# Patient Record
Sex: Female | Born: 1945 | Race: White | Hispanic: No | Marital: Married | Smoking: Current every day smoker
Health system: Southern US, Community
[De-identification: ages and names within clinical notes are randomized; demographics above are authoritative.]

## PROBLEM LIST (undated history)

## (undated) DIAGNOSIS — N184 Chronic kidney disease, stage 4 (severe): Secondary | ICD-10-CM

## (undated) DIAGNOSIS — I639 Cerebral infarction, unspecified: Secondary | ICD-10-CM

## (undated) DIAGNOSIS — F319 Bipolar disorder, unspecified: Secondary | ICD-10-CM

## (undated) DIAGNOSIS — G629 Polyneuropathy, unspecified: Secondary | ICD-10-CM

## (undated) DIAGNOSIS — F329 Major depressive disorder, single episode, unspecified: Secondary | ICD-10-CM

## (undated) DIAGNOSIS — E785 Hyperlipidemia, unspecified: Secondary | ICD-10-CM

## (undated) DIAGNOSIS — N393 Stress incontinence (female) (male): Secondary | ICD-10-CM

## (undated) DIAGNOSIS — Z8719 Personal history of other diseases of the digestive system: Secondary | ICD-10-CM

## (undated) DIAGNOSIS — N189 Chronic kidney disease, unspecified: Secondary | ICD-10-CM

## (undated) DIAGNOSIS — M1990 Unspecified osteoarthritis, unspecified site: Secondary | ICD-10-CM

## (undated) DIAGNOSIS — R0602 Shortness of breath: Secondary | ICD-10-CM

## (undated) DIAGNOSIS — E7800 Pure hypercholesterolemia, unspecified: Secondary | ICD-10-CM

## (undated) DIAGNOSIS — N9089 Other specified noninflammatory disorders of vulva and perineum: Secondary | ICD-10-CM

## (undated) DIAGNOSIS — F429 Obsessive-compulsive disorder, unspecified: Secondary | ICD-10-CM

## (undated) DIAGNOSIS — I10 Essential (primary) hypertension: Secondary | ICD-10-CM

## (undated) DIAGNOSIS — Z8673 Personal history of transient ischemic attack (TIA), and cerebral infarction without residual deficits: Secondary | ICD-10-CM

## (undated) DIAGNOSIS — F419 Anxiety disorder, unspecified: Secondary | ICD-10-CM

## (undated) DIAGNOSIS — I82409 Acute embolism and thrombosis of unspecified deep veins of unspecified lower extremity: Secondary | ICD-10-CM

## (undated) DIAGNOSIS — R413 Other amnesia: Secondary | ICD-10-CM

## (undated) DIAGNOSIS — C519 Malignant neoplasm of vulva, unspecified: Secondary | ICD-10-CM

## (undated) DIAGNOSIS — R0600 Dyspnea, unspecified: Secondary | ICD-10-CM

## (undated) DIAGNOSIS — F32A Depression, unspecified: Secondary | ICD-10-CM

## (undated) DIAGNOSIS — G5600 Carpal tunnel syndrome, unspecified upper limb: Secondary | ICD-10-CM

## (undated) DIAGNOSIS — G5790 Unspecified mononeuropathy of unspecified lower limb: Secondary | ICD-10-CM

## (undated) DIAGNOSIS — K219 Gastro-esophageal reflux disease without esophagitis: Secondary | ICD-10-CM

## (undated) DIAGNOSIS — R269 Unspecified abnormalities of gait and mobility: Secondary | ICD-10-CM

## (undated) HISTORY — PX: CATARACT EXTRACTION: SUR2

## (undated) HISTORY — DX: Polyneuropathy, unspecified: G62.9

## (undated) HISTORY — DX: Hyperlipidemia, unspecified: E78.5

## (undated) HISTORY — DX: Other amnesia: R41.3

## (undated) HISTORY — DX: Carpal tunnel syndrome, unspecified upper limb: G56.00

## (undated) HISTORY — DX: Malignant neoplasm of vulva, unspecified: C51.9

## (undated) HISTORY — DX: Unspecified abnormalities of gait and mobility: R26.9

---

## 1983-08-19 HISTORY — PX: OTHER SURGICAL HISTORY: SHX169

## 1983-08-19 HISTORY — PX: VAGINAL HYSTERECTOMY: SUR661

## 1994-08-18 HISTORY — PX: BLADDER SUSPENSION: SHX72

## 1997-10-09 ENCOUNTER — Ambulatory Visit (HOSPITAL_COMMUNITY): Admission: RE | Admit: 1997-10-09 | Discharge: 1997-10-09 | Payer: Self-pay | Admitting: Internal Medicine

## 1998-05-15 ENCOUNTER — Other Ambulatory Visit: Admission: RE | Admit: 1998-05-15 | Discharge: 1998-05-15 | Payer: Self-pay | Admitting: *Deleted

## 1998-07-10 ENCOUNTER — Other Ambulatory Visit: Admission: RE | Admit: 1998-07-10 | Discharge: 1998-07-10 | Payer: Self-pay | Admitting: *Deleted

## 1998-07-18 HISTORY — PX: VULVECTOMY PARTIAL: SHX6187

## 1998-11-15 ENCOUNTER — Other Ambulatory Visit: Admission: RE | Admit: 1998-11-15 | Discharge: 1998-11-15 | Payer: Self-pay | Admitting: *Deleted

## 1999-04-11 ENCOUNTER — Ambulatory Visit (HOSPITAL_COMMUNITY): Admission: RE | Admit: 1999-04-11 | Discharge: 1999-04-11 | Payer: Self-pay | Admitting: *Deleted

## 1999-04-26 ENCOUNTER — Other Ambulatory Visit: Admission: RE | Admit: 1999-04-26 | Discharge: 1999-04-26 | Payer: Self-pay | Admitting: Gastroenterology

## 1999-04-26 ENCOUNTER — Encounter (INDEPENDENT_AMBULATORY_CARE_PROVIDER_SITE_OTHER): Payer: Self-pay | Admitting: Specialist

## 2000-04-07 ENCOUNTER — Other Ambulatory Visit: Admission: RE | Admit: 2000-04-07 | Discharge: 2000-04-07 | Payer: Self-pay | Admitting: *Deleted

## 2000-04-20 ENCOUNTER — Inpatient Hospital Stay (HOSPITAL_COMMUNITY): Admission: EM | Admit: 2000-04-20 | Discharge: 2000-04-22 | Payer: Self-pay | Admitting: *Deleted

## 2000-04-23 ENCOUNTER — Observation Stay (HOSPITAL_COMMUNITY): Admission: EM | Admit: 2000-04-23 | Discharge: 2000-04-24 | Payer: Self-pay | Admitting: Psychiatry

## 2000-04-24 ENCOUNTER — Encounter: Payer: Self-pay | Admitting: Internal Medicine

## 2000-04-28 ENCOUNTER — Ambulatory Visit (HOSPITAL_COMMUNITY): Admission: RE | Admit: 2000-04-28 | Discharge: 2000-04-28 | Payer: Self-pay | Admitting: *Deleted

## 2001-04-13 ENCOUNTER — Other Ambulatory Visit: Admission: RE | Admit: 2001-04-13 | Discharge: 2001-04-13 | Payer: Self-pay | Admitting: *Deleted

## 2002-10-17 ENCOUNTER — Other Ambulatory Visit: Admission: RE | Admit: 2002-10-17 | Discharge: 2002-10-17 | Payer: Self-pay | Admitting: Internal Medicine

## 2002-10-24 ENCOUNTER — Encounter: Payer: Self-pay | Admitting: Internal Medicine

## 2002-10-24 ENCOUNTER — Ambulatory Visit (HOSPITAL_COMMUNITY): Admission: RE | Admit: 2002-10-24 | Discharge: 2002-10-24 | Payer: Self-pay | Admitting: Internal Medicine

## 2003-03-23 ENCOUNTER — Ambulatory Visit (HOSPITAL_COMMUNITY): Admission: RE | Admit: 2003-03-23 | Discharge: 2003-03-23 | Payer: Self-pay | Admitting: Neurology

## 2003-03-23 ENCOUNTER — Encounter: Payer: Self-pay | Admitting: Neurology

## 2003-06-28 ENCOUNTER — Encounter: Payer: Self-pay | Admitting: Gastroenterology

## 2004-11-14 ENCOUNTER — Ambulatory Visit (HOSPITAL_COMMUNITY): Admission: RE | Admit: 2004-11-14 | Discharge: 2004-11-14 | Payer: Self-pay | Admitting: Internal Medicine

## 2005-06-09 ENCOUNTER — Ambulatory Visit (HOSPITAL_COMMUNITY): Admission: RE | Admit: 2005-06-09 | Discharge: 2005-06-09 | Payer: Self-pay | Admitting: Internal Medicine

## 2005-11-19 ENCOUNTER — Ambulatory Visit (HOSPITAL_COMMUNITY): Admission: RE | Admit: 2005-11-19 | Discharge: 2005-11-19 | Payer: Self-pay | Admitting: Internal Medicine

## 2006-03-04 ENCOUNTER — Other Ambulatory Visit: Admission: RE | Admit: 2006-03-04 | Discharge: 2006-03-04 | Payer: Self-pay | Admitting: Obstetrics & Gynecology

## 2006-12-09 ENCOUNTER — Encounter: Payer: Self-pay | Admitting: Endocrinology

## 2007-01-12 ENCOUNTER — Ambulatory Visit (HOSPITAL_COMMUNITY): Admission: RE | Admit: 2007-01-12 | Discharge: 2007-01-12 | Payer: Self-pay | Admitting: Internal Medicine

## 2007-02-18 ENCOUNTER — Encounter: Admission: RE | Admit: 2007-02-18 | Discharge: 2007-02-18 | Payer: Self-pay | Admitting: Orthopedic Surgery

## 2007-03-23 ENCOUNTER — Encounter: Admission: RE | Admit: 2007-03-23 | Discharge: 2007-05-18 | Payer: Self-pay | Admitting: Internal Medicine

## 2007-05-05 ENCOUNTER — Other Ambulatory Visit: Admission: RE | Admit: 2007-05-05 | Discharge: 2007-05-05 | Payer: Self-pay | Admitting: Obstetrics & Gynecology

## 2008-01-04 ENCOUNTER — Encounter: Admission: RE | Admit: 2008-01-04 | Discharge: 2008-01-04 | Payer: Self-pay | Admitting: Orthopedic Surgery

## 2008-01-17 LAB — HM DEXA SCAN

## 2008-01-27 ENCOUNTER — Ambulatory Visit (HOSPITAL_COMMUNITY): Admission: RE | Admit: 2008-01-27 | Discharge: 2008-01-27 | Payer: Self-pay | Admitting: Internal Medicine

## 2008-06-02 ENCOUNTER — Ambulatory Visit: Payer: Self-pay | Admitting: Gastroenterology

## 2008-06-13 ENCOUNTER — Ambulatory Visit: Payer: Self-pay | Admitting: Gastroenterology

## 2008-06-16 ENCOUNTER — Telehealth: Payer: Self-pay | Admitting: Gastroenterology

## 2008-07-07 ENCOUNTER — Telehealth: Payer: Self-pay | Admitting: Gastroenterology

## 2008-07-10 ENCOUNTER — Telehealth: Payer: Self-pay | Admitting: Gastroenterology

## 2008-07-10 ENCOUNTER — Ambulatory Visit: Payer: Self-pay | Admitting: Gastroenterology

## 2008-07-10 ENCOUNTER — Encounter: Payer: Self-pay | Admitting: Gastroenterology

## 2008-07-12 ENCOUNTER — Encounter: Payer: Self-pay | Admitting: Gastroenterology

## 2008-08-08 ENCOUNTER — Other Ambulatory Visit: Admission: RE | Admit: 2008-08-08 | Discharge: 2008-08-08 | Payer: Self-pay | Admitting: Obstetrics and Gynecology

## 2009-02-22 ENCOUNTER — Ambulatory Visit (HOSPITAL_COMMUNITY): Admission: RE | Admit: 2009-02-22 | Discharge: 2009-02-22 | Payer: Self-pay | Admitting: Internal Medicine

## 2009-04-30 ENCOUNTER — Ambulatory Visit (HOSPITAL_COMMUNITY): Admission: RE | Admit: 2009-04-30 | Discharge: 2009-04-30 | Payer: Self-pay | Admitting: Internal Medicine

## 2009-05-23 ENCOUNTER — Encounter: Payer: Self-pay | Admitting: Endocrinology

## 2009-08-15 ENCOUNTER — Encounter: Payer: Self-pay | Admitting: Endocrinology

## 2009-08-23 ENCOUNTER — Ambulatory Visit: Payer: Self-pay | Admitting: Vascular Surgery

## 2009-08-23 ENCOUNTER — Encounter: Payer: Self-pay | Admitting: Endocrinology

## 2009-10-04 LAB — CONVERTED CEMR LAB

## 2009-10-12 ENCOUNTER — Ambulatory Visit: Payer: Self-pay | Admitting: Endocrinology

## 2009-10-12 DIAGNOSIS — J438 Other emphysema: Secondary | ICD-10-CM | POA: Insufficient documentation

## 2009-10-12 DIAGNOSIS — I10 Essential (primary) hypertension: Secondary | ICD-10-CM | POA: Insufficient documentation

## 2009-10-12 DIAGNOSIS — F339 Major depressive disorder, recurrent, unspecified: Secondary | ICD-10-CM | POA: Insufficient documentation

## 2009-11-12 ENCOUNTER — Encounter: Payer: Self-pay | Admitting: Endocrinology

## 2010-06-25 ENCOUNTER — Ambulatory Visit (HOSPITAL_COMMUNITY): Admission: RE | Admit: 2010-06-25 | Discharge: 2010-06-25 | Payer: Self-pay | Admitting: Internal Medicine

## 2010-07-19 ENCOUNTER — Ambulatory Visit (HOSPITAL_COMMUNITY)
Admission: RE | Admit: 2010-07-19 | Discharge: 2010-07-19 | Payer: Self-pay | Source: Home / Self Care | Admitting: Internal Medicine

## 2010-07-19 LAB — HM MAMMOGRAPHY: HM Mammogram: NEGATIVE

## 2010-08-23 ENCOUNTER — Ambulatory Visit
Admission: RE | Admit: 2010-08-23 | Discharge: 2010-08-23 | Payer: Self-pay | Source: Home / Self Care | Attending: Gastroenterology | Admitting: Gastroenterology

## 2010-08-23 ENCOUNTER — Encounter (INDEPENDENT_AMBULATORY_CARE_PROVIDER_SITE_OTHER): Payer: Self-pay | Admitting: *Deleted

## 2010-08-23 ENCOUNTER — Encounter: Payer: Self-pay | Admitting: Gastroenterology

## 2010-09-19 ENCOUNTER — Encounter: Payer: Self-pay | Admitting: Gastroenterology

## 2010-09-19 ENCOUNTER — Other Ambulatory Visit: Payer: Self-pay | Admitting: Gastroenterology

## 2010-09-19 ENCOUNTER — Ambulatory Visit (HOSPITAL_COMMUNITY)
Admission: RE | Admit: 2010-09-19 | Discharge: 2010-09-19 | Disposition: A | Discharge status: Home / Self Care | Payer: MEDICARE | Source: Ambulatory Visit | Attending: Gastroenterology | Admitting: Gastroenterology

## 2010-09-19 DIAGNOSIS — K2990 Gastroduodenitis, unspecified, without bleeding: Secondary | ICD-10-CM

## 2010-09-19 DIAGNOSIS — K2970 Gastritis, unspecified, without bleeding: Secondary | ICD-10-CM | POA: Insufficient documentation

## 2010-09-19 DIAGNOSIS — R1310 Dysphagia, unspecified: Secondary | ICD-10-CM | POA: Insufficient documentation

## 2010-09-19 DIAGNOSIS — J45909 Unspecified asthma, uncomplicated: Secondary | ICD-10-CM | POA: Insufficient documentation

## 2010-09-19 DIAGNOSIS — Z8673 Personal history of transient ischemic attack (TIA), and cerebral infarction without residual deficits: Secondary | ICD-10-CM | POA: Insufficient documentation

## 2010-09-19 DIAGNOSIS — E119 Type 2 diabetes mellitus without complications: Secondary | ICD-10-CM | POA: Insufficient documentation

## 2010-09-19 DIAGNOSIS — I10 Essential (primary) hypertension: Secondary | ICD-10-CM | POA: Insufficient documentation

## 2010-09-19 NOTE — Procedures (Signed)
Summary: Colonoscopy   Colonoscopy  Procedure date:  07/10/2008  Findings:      Location:  [place].    Procedures Next Due Date:    Colonoscopy: 07/2013  Patient Name: Ashley [name], Ashley [name] MRN:  Procedure Procedures: Colonoscopy CPT: B7970758.    with polypectomy. CPT: X5071110.  Personnel: Endoscopist: [name], MD.  Exam Location: Exam performed in Endoscopy Suite. Outpatient  Patient Consent: Procedure, Alternatives, Risks and Benefits discussed, consent obtained, from patient. Consent was obtained by the RN.  Indications Symptoms: Constipation  Surveillance of: Adenomatous Polyp(s).  History  Current Medications: Patient is not currently taking Coumadin.  Comments: Patient history reviewed and updated, pre-procedure physical performed prior to initiation of sedation? yes Pre-Exam Physical: Performed Jul 10, 2008. Cardio-pulmonary exam, Abdominal exam, Mental status exam WNL.  Exam Exam: Extent of exam reached: Cecum, extent intended: Cecum.  The cecum was identified by appendiceal orifice and IC valve. Patient position: on left side. Time to Cecum: 00:06:29. Time for Withdrawl: 00:08:07. Colon retroflexion performed. Images taken. ASA Classification: II. Tolerance: good.  Monitoring: Pulse and BP monitoring, Oximetry used. Supplemental O2 given.  Colon Prep Prep results: good.  Sedation Meds: Patient assessed and found to be appropriate for moderate (conscious) sedation. Fentanyl 100 mcg. given IV. Versed 12 given IV.  Findings POLYP: Transverse Colon, Maximum size: 7 mm. sessile polyp. Procedure:  snare without cautery, removed, retrieved, Polyp sent to pathology. Path # 1.  - MELANOSIS: Descending Colon to Sigmoid Colon. 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  - DIVERTICULOSIS: Descending Colon to Sigmoid Colon. Comments: SEVERAL LEFT SIDED DIVERTICULUM.  - NORMAL EXAM: Cecum to Rectum. Comments: OTHERWISE  NORMAL EXAMINATION.   Assessment Abnormal examination, see findings above.  Comments: SINGLE SMALL POLYP, NO CANCERS.  +DIVERTICULOSIS AND MELANOSIS STAINING OF MUCOSA (FROM CHRONIC STIMULANT LAXATIVES.    EVEN IF THE POLYP REMOVED TODAY IS NOT PRE-CANCEROUS, SHE WILL NEED A REPEAT COLONOSCOPY IN 5 YEARS GIVEN HER HISTORY OF PRECANCEROUS POLYPS (ADENOMAS).  SHE HAS NOT YET TRIED THE CITRUCEL FIBER SUPPLEMENTS THAT I RECOMMENDED BUT WILL DO SO. Events  Unplanned Interventions: No intervention was required.  Unplanned Events: There were no complications.   cc: Kirtland Bouchard, MD   REPORT OF SURGICAL PATHOLOGY   Case #: [id number] Patient Name: Ashley [name], Ashley L. Office Chart Number:  [id number]   MRN: [id number] Pathologist: Jaquelyn Bitter A. Rodney Cruise, MD DOB/Age  [birth year] (Age: 65)    Gender: F Date Taken:  07/10/2008 Date Received: 07/10/2008   FINAL DIAGNOSIS   ***MICROSCOPIC EXAMINATION AND DIAGNOSIS***   TRANSVERSE COLON, POLYP:  TUBULAR ADENOMA.  NO HIGH GRADE DYSPLASIA OR MALIGNANCY IDENTIFIED.   kv Date Reported:  07/11/2008     Louanne Belton. Rodney Cruise, MD *** Electronically Signed Out By EAA ***   Clinical information Personal history of polyps (kv)   specimen(s) obtained Colon, polyp(s), transverse   Gross Description Received in formalin is a tan, soft tissue fragment that is submitted in toto.  Size:  0.6 cm.  (GP:gt, 07/10/08)   gdt/     Signed by [name] MD on 07/12/2008 at 7:52 AM  ________________________________________________________________________ recall colonoscopy in 5 years   Signed by [name] MD on 07/12/2008 at 7:53 AM  ________________________________________________________________________    July 12, 2008 MRN: [id number]    [facility] [address] [place], [place]  [address]    [name],   The polyp removed during your recent procedure was proven to be adenomatous.  These are pre-cancerous polyps  that may have grown into cancers if they had not been removed.  Current nationally recognized surveillance guidelines recommend that you have a repeat colonoscopy in 5 years.  We will therefore put your information in our reminder system and will contact you in 5 years to schedule a repeat procedure.  Please call if you have any questions or concerns.       Sincerely,  [name] MD  This letter has been electronically signed by your physician.   Signed by [name] MD on 07/12/2008 at 7:53 AM  ________________________________________________________________________

## 2010-09-19 NOTE — Progress Notes (Signed)
Summary: Question about her meds before procedure  Phone Note Call from Patient Call back at Home Phone [phone]   Call For: Dr Ardis Hughs Reason for Call: Talk to Nurse Summary of Call: Question about meds before her procedure Initial call taken by: Irwin Brakeman [facility],  June 16, 2008 9:25 AM  Follow-up for Phone Call        Pt wanted to be sure that Dr. Ardis Hughs knows that her meds are prescribed and that he will see the meds she is own.  She came in for [facility] and med list given then. Follow-up by: Shaune Spittle RN,  June 16, 2008 9:40 AM

## 2010-09-19 NOTE — Miscellaneous (Signed)
Summary: previsit  Clinical Lists Changes  Medications: Added new medication of MOVIPREP 100 GM  SOLR (PEG-KCL-NACL-NASULF-NA ASC-C) As per prep instructions. - Signed Rx of MOVIPREP 100 GM  SOLR (PEG-KCL-NACL-NASULF-NA ASC-C) As per prep instructions.;  #1 x 0;  Signed;  Entered by: Rush Barer RN;  Authorized by: [name] MD;  Method used: Electronically to [facility]. [phone]*, [address] [place], [place]  [address], Ph: [phone] or [phone], Fax: [phone] Allergies: Added new allergy or adverse reaction of * IVP DYE    Prescriptions: MOVIPREP 100 GM  SOLR (PEG-KCL-NACL-NASULF-NA ASC-C) As per prep instructions.  #1 x 0   Entered by:   Rush Barer RN   Authorized by:   [name] MD   Signed by:   Rush Barer RN on 06/13/2008   Method used:   Electronically to        [facility] [phone]* (retail)       [address], [place]  [address]       Ph: [phone] or [phone]       Fax: [phone]   RxID:   [id number]

## 2010-09-19 NOTE — Assessment & Plan Note (Signed)
Summary: new endo/diabetes/#/medicare/cd   Vital Signs:  Patient profile:   65 year old female Height:      64 inches (162.56 cm) Weight:      200.50 pounds (91.14 kg) BMI:     34.54 O2 Sat:      96 % on Room air Temp:     96.4 degrees F (35.78 degrees C) oral Pulse rate:   79 / minute BP sitting:   110 / 60  (left arm) Cuff size:   large  Vitals Entered By: Gardenia Phlegm RMA (October 12, 2009 1:37 PM)  O2 Flow:  Room air CC: New Endo: Diabetes/ pt states she is not taking Budeprion, Oxybutynin, Pravachol, glimepiride, or Levemir/ CF Is Patient Diabetic? Yes   Referring Provider:  Unk Pinto Primary Provider:  Unk Pinto  CC:  New Endo: Diabetes/ pt states she is not taking Budeprion, Oxybutynin, Pravachol, glimepiride, and or Levemir/ CF.  History of Present Illness: pt states 15 years h/o dm.  she denies knowing of any chronic complications.  she has been on insulin x 2 years.  she takes nph, 50 units qam, and 25 units qpm, as well as metformin and glimepiride.  she says the cost of medications is a primary consideration.  she brings a record of her cbg's which i have reviewed today. she checks in am--it varies from 74-128.   pt says her diet is not good, and her exercise is limited by medical conditions.   symptomatically, pt states 2 years of moderate bilateral leg pain and toe pain, and slight associated numbness. pt's husband gives pt insulin, and works during the day.   Current Medications (verified): 1)  Fluoxetine Hcl 40 Mg Caps (Fluoxetine Hcl) .... Take 1 Tab Daily 2)  Budeprion Sr 150 Mg Xr12h-Tab (Bupropion Hcl) .... 2 Tab Am and 1 At Noon 3)  Clonazepam Odt 1 Mg Tbdp (Clonazepam) .... Take 1 Three Times A Day 4)  Oxybutynin Chloride 5 Mg Xr24h-Tab (Oxybutynin Chloride) .... Take 1 Tab Two Times A Day 5)  Nabumetone 750 Mg Tabs (Nabumetone) .... Take 1-2 Tabs Daily 6)  Pravachol 40 Mg Tabs (Pravastatin Sodium) .Marland Kitchen.. 1 Tab At Bedtime 7)   Hydrochlorothiazide 25 Mg Tabs (Hydrochlorothiazide) .Marland Kitchen.. 1 Tab Am 8)  Enalapril Maleate 20 Mg Tabs (Enalapril Maleate) .Marland Kitchen.. 1 Tab Once Daily 9)  Simvastatin 80 Mg Tabs (Simvastatin) .Marland Kitchen.. 1 At Bedtime 10)  Ranitidine Hcl 300 Mg Tabs (Ranitidine Hcl) .Marland Kitchen.. 1 Daily 11)  Astepro .Marland Kitchen.. 1-2 Sprays At Bedtime 12)  Flovent Diskus 100 Mcg/blist Aepb (Fluticasone Propionate (Inhal)) .... Inhale Am and Pm 13)  Spiriva Handihaler 18 Mcg Caps (Tiotropium Bromide Monohydrate) .... Inhale Am and Pm 14)  Magnesium Gluconate 500 Mg Tabs (Magnesium Gluconate) .... 3 A Day With Meals 15)  Vitamin D 8000 Mg .... Pm 16)  Vitamin B-12 2000 Mg .Marland KitchenMarland Kitchen. 1 A Day 17)  Tylenol Pain Relief Pm 525mg  .... 2 At Pm 18)  Stool Softners 58.6mg  .... 2 At Pm 19)  Novolin N 100 Unit/ml Susp (Insulin Isophane Human) .... 50 Units Am and 25 Units Pm  Allergies (verified): 1)  ! Sulfacetamide (Sulfacetamide) 2)  ! * Ivp Dye  Past History:  Past Medical History: Alcoholism Anxiety Disorder GERD Hypothyroidism Obesity hypomania eating disorder fibrocystic breast disease djd HYPERCHOLESTEROLEMIA (ICD-272.0) OTHER EMPHYSEMA (ICD-492.8) HYPERTENSION (ICD-401.9) DEPRESSION (ICD-311) IDDM (ICD-250.01) FOOT PAIN (ICD-729.5) CONSTIPATION (ICD-564.00) PERSONAL HX COLONIC POLYPS (ICD-V12.72)  Family History: Reviewed history from 06/02/2008 and no changes required. no colon  cancer in family no diabetes in immediate family.  a grandparent dies at a young age from dm  Social History: Reviewed history from 06/02/2008 and no changes required. she is married, she has no children, she is on disability, she smokes 3 packs of cigarettes a day for many years, she does not drink alcohol, she does not drink caffeinated beverages.  Review of Systems       The patient complains of weight gain and depression.         denies blurry vision, chest pain, cramps, excessive diaphoresis, and menopausal sxs. she has intermittent headache and  doe.  she has 1 episode of n/v yeaterday, when she had hypoglycemia.  she reports polyuria, easy bruising, and memory loss.  Physical Exam  General:  obese.   Head:  head: no deformity eyes: no periorbital swelling, no proptosis external nose and ears are normal mouth: no lesion seen Neck:  Supple without thyroid enlargement or tenderness. No cervical lymphadenopathy, neck masses or tracheal deviation.  Lungs:  Clear to auscultation bilaterally. Normal respiratory effort.  Heart:  Regular rate and rhythm without murmurs or gallops noted. Normal S1,S2.   Msk:  muscle bulk and strength are grossly normal.  no obvious joint swelling.  gait is normal and steady  Pulses:  dorsalis pedis intact bilat.  no carotid bruit  Extremities:  no deformity.  no ulcer on the feet.  feet are of normal color and temp.  no edema there is onychomycosis at the right great toe. Neurologic:  cn 2-12 grossly intact.   readily moves all 4's.   sensation is intact to touch on the feet  Skin:  normal texture and temp.  no rash.  not diaphoretic  Cervical Nodes:  No significant adenopathy.  Psych:  depressed affect.   Additional Exam:  outside test results are reviewed:  08/15/09: a1c=9.3    Impression & Recommendations:  Problem # 1:  IDDM (ICD-250.01) therapy limited by her husband's absence during the day.  Problem # 2:  leg and foot pain/numbness ? neuropathic  Problem # 3:  DEPRESSION (ICD-311) this limits the rx of #1  Medications Added to Medication List This Visit: 1)  Fluoxetine Hcl 40 Mg Caps (fluoxetine Hcl)  .... Take 1 tab daily 2)  Novolin R 100 Unit/ml Soln (Insulin regular human) .... Inject 50 units am and 25 units pm 3)  Nabumetone 750 Mg Tabs (nabumetone)  .... Take 1-2 tabs daily 4)  Simvastatin 80 Mg Tabs (Simvastatin) .Marland Kitchen.. 1 at bedtime 5)  Ranitidine Hcl 300 Mg Tabs (Ranitidine hcl) .Marland Kitchen.. 1 daily 6)  Astepro  .Marland Kitchen.. 1-2 sprays at bedtime 7)  Flovent Diskus 100 Mcg/blist Aepb  (Fluticasone propionate (inhal)) .... Inhale am and pm 8)  Spiriva Handihaler 18 Mcg Caps (Tiotropium bromide monohydrate) .... Inhale am and pm 9)  Magnesium Gluconate 500 Mg Tabs (Magnesium gluconate) .... 3 a day with meals 10)  Vitamin D 8000 Mg  .... Pm 11)  Vitamin B-12 2000 Mg  .Marland Kitchen.. 1 a day 12)  Tylenol Pain Relief Pm 525mg   .... 2 at pm 13)  Stool Softners 58.6mg   .... 2 at pm 14)  Novolin N 100 Unit/ml Susp (Insulin isophane human) .... 50 units am and 25 units pm  Other Orders: Podiatry Referral (Podiatry) Consultation Level IV [id number])  Patient Instructions: 1)  we discussed the importance of diet and exercise therapy and the risks of diabetes.  you should see an eye doctor every year. 2)  i  told pt we will need to take this complex situation in stages 3)  it is very important to keep good control of blood pressure and cholesterol, especially in those with diabetes.  stopping smoking also reduces the damage diabetes does to your body.  please discuss these with your doctor.  you should take an aspirin every day, unless you have been advised by a doctor not to. 4)  check your blood sugar 1-2 times a day.  vary the time of day when you check, between before the 3 meals, and at bedtime.  also check if you have symptoms of your blood sugar being too high or too low.  please keep a record of the readings and bring it to your next appointment here.  please call us sooner if you are having low blood sugar episodes. 5)  for now, stop the metformin. 6)  continue the same insulin.  please verify that it has a big "N" on the label, and is cloudy. 7)  please bring all medication bottles to your next appointment. 8)  refer to podiatry.  you will be called with a day and time for an appointment.  Preventive Care Screening  Hemoccult:    Date:  10/04/2009    Results:  historical   Pap Smear:    Date:  10/04/2009    Results:  historical   Last Tetanus Booster:    Date:  08/18/2008     Results:  Historical   Mammogram:    Date:  08/18/2008    Results:  historical   Colonoscopy:    Date:  08/19/2007    Results:  historical

## 2010-09-19 NOTE — Procedures (Signed)
Summary: colon/[facility]  colon/[facility]   Imported By: Bubba Hales 05/09/2008 10:41:08  _____________________________________________________________________  External Attachment:    Type:   Image     Comment:   External Document

## 2010-09-19 NOTE — Assessment & Plan Note (Signed)
Review of gastrointestinal problems: 1. Adenomatous colon polyps. Colonoscopy 06/2008 (adenomas removed), recall colonoscopy at 5 year interval.    History of Present Illness Visit Type: Follow-up Visit Primary GI MD: Owens Loffler MD Primary Provider: Unk Pinto Requesting Provider: Unk Pinto Chief Complaint: dysphagia History of Present Illness:     pleasant 65 year old woman who says she is constipated. can go 3 weeks without a BM.  Usually will go every 2 weeks or so.   She takes two stool softners.  She has never tried fiber supplements.  Never tried miralx.   she has a poor short term memory.  She had solid food dysphagia...  This was all 3 months ago. The symptoms have all impoved. Sounds like she had an ENT visit and was told her exam was normal.  She get pyrosis intermittently.  she doesn't think she is losing weight.           Current Medications (verified): 1)  Fluoxetine Hcl 40 Mg Caps (Fluoxetine Hcl) .... Take 1 Tab Daily 2)  Clonazepam Odt 1 Mg Tbdp (Clonazepam) .... Take 1 Three Times A Day 3)  Oxybutynin Chloride 5 Mg Xr24h-Tab (Oxybutynin Chloride) .... Take 1 Tab Two Times A Day 4)  Nabumetone 750 Mg Tabs (Nabumetone) .... Take 1-2 Tabs Daily 5)  Hydrochlorothiazide 25 Mg Tabs (Hydrochlorothiazide) .Marland Kitchen.. 1 Tab Am 6)  Enalapril Maleate 20 Mg Tabs (Enalapril Maleate) .Marland Kitchen.. 1 Tab Once Daily 7)  Simvastatin 80 Mg Tabs (Simvastatin) .Marland Kitchen.. 1 At Bedtime 8)  Ranitidine Hcl 300 Mg Tabs (Ranitidine Hcl) .Marland Kitchen.. 1 Daily 9)  Novolin N 100 Unit/ml Susp (Insulin Isophane Human) .... 50 Units Am and 25 Units Pm  Allergies (verified): 1)  ! Sulfacetamide (Sulfacetamide) 2)  ! * Ivp Dye  Vital Signs:  Patient profile:   65 year old female Height:      64 inches Weight:      213.25 pounds BMI:     36.74 Pulse rate:   80 / minute Pulse rhythm:   regular BP sitting:   110 / 60  (left arm) Cuff size:   regular  Vitals Entered By: June McMurray Mahomet Deborra Medina)  (August 23, 2010 2:19 PM)  Physical Exam  Additional Exam:  Constitutional: generally well appearing Psychiatric: alert and oriented times 3 Abdomen: soft, non-tender, non-distended, normal bowel sounds    Impression & Recommendations:  Problem # 1:  intermittent dysphasia this is probably GERD related. I have asked her to stop the H2 blocker and instead she has been given samples of a proton pump inhibitor to take once daily. We will schedule her for EGD at her soonest convenience.  Problem # 2:  CONSTIPATION (ICD-564.00) she has never try fiber supplements and will do so now.  Patient Instructions: 1)  You should start 2 pills of citrucel every morning to get your bowels moving. 2)  Nexium samples given, take one every morning. 3)  Stop the ranitidine. 4)  You will be scheduled to have an upper endoscopy at [facility] with propofol. 5)  A copy of this information will be sent to Dr. Melford Aase. 6)  The medication list was reviewed and reconciled.  All changed / newly prescribed medications were explained.  A complete medication list was provided to the patient / caregiver.  Appended Document: Orders Update/EGD    Clinical Lists Changes  Problems: Added new problem of OTHER DYSPHAGIA (ICD-787.29) Medications: Added new medication of DEXILANT 60 MG CPDR (DEXLANSOPRAZOLE) 1 by mouth once daily Orders: Added  new Test order of ZEGD (ZEGD) - Signed

## 2010-09-19 NOTE — Progress Notes (Signed)
Summary: lost prep instructions  Phone Note Call from Patient Call back at Home Phone [phone]   Caller: Patient Call For: Ashley [name]  Reason for Call: Talk to Nurse Details for Reason: instruction Summary of Call: proc on Mon --has lost instructions Initial call taken by: Quenton Fetter [facility],  July 07, 2008 10:28 AM  Follow-up for Phone Call        Pt. to send mother for copy of prep instructions Follow-up by: Emerson Monte RN,  July 07, 2008 11:03 AM  Additional Follow-up for Phone Call Additional follow up Details #1::        Pt. called and said she found her instructions for her colon prep Additional Follow-up by: Emerson Monte RN,  July 07, 2008 11:26 AM

## 2010-09-19 NOTE — Letter (Signed)
Summary: [facility]  [facility]   Imported By: Phillis Knack 11/17/2009 08:58:59  _____________________________________________________________________  External Attachment:    Type:   Image     Comment:   External Document

## 2010-09-19 NOTE — Letter (Signed)
Summary: EGD Instructions  [facility]  [place], [place] [address]   Phone: [phone]  Fax: [phone]       [name]    1946/04/15    MRN: [id number]       Procedure Day /Date:09/19/10 Ashley [name]     Arrival Time: [id number] am     Procedure Time:1030 am     Location of Procedure:                     [facility] ( Outpatient Registration)    PREPARATION FOR ENDOSCOPY   On 09/19/10  THE DAY OF THE PROCEDURE:  Nothing to eat or drink after midnight  MEDICATION INSTRUCTIONS  Unless otherwise instructed, you should take regular prescription medications with a small sip of water as early as possible the morning of your procedure.  Diabetic patients - see separate instructions.                 OTHER INSTRUCTIONS  You will need a responsible adult at least 64 years of age to accompany you and drive you home.   This person must remain in the waiting room during your procedure.  Wear loose fitting clothing that is easily removed.  Leave jewelry and other valuables at home.  However, you may wish to bring a book to read or an iPod/MP3 player to listen to music as you wait for your procedure to start.  Remove all body piercing jewelry and leave at home.  Total time from sign-in until discharge is approximately 2-3 hours.  You should go home directly after your procedure and rest.  You can resume normal activities the day after your procedure.  The day of your procedure you should not:   Drive   Make legal decisions   Operate machinery   Drink alcohol   Return to work  You will receive specific instructions about eating, activities and medications before you leave.    The above instructions have been reviewed and explained to me by   _______________________    I fully understand and can verbalize these instructions _____________________________ Date _________

## 2010-09-19 NOTE — Letter (Signed)
Summary: [facility]  [facility]   Imported By: Phillis Knack 10/16/2009 10:07:50  _____________________________________________________________________  External Attachment:    Type:   Image     Comment:   External Document

## 2010-09-19 NOTE — Letter (Signed)
Summary: Diabetic Instructions  [facility]  [place], [place] [address]   Phone: [phone]  Fax: [phone]    [name] 12-26-1945 MRN: [id number]     ________________________________________________________________________  X  INSULIN (LONG ACTING) MEDICATION INSTRUCTIONS (Lantus, NPH, 70/30, Humulin, Novolin-N)   The day before your procedure:   Take  your regular evening dose    The day of your procedure:   Do not take your morning dose    X   INSULIN (SHORT ACTING) MEDICATION INSTRUCTIONS (Regular, Humulog, Novolog)   The day before your procedure:   Do not take your evening dose   The day of your procedure:   Do not take your morning dose

## 2010-09-19 NOTE — Assessment & Plan Note (Signed)
History of Present Illness Visit Type: new patient Primary GI MD: Owens Loffler MD Primary Provider: Unk Pinto Requesting Provider: Unk Pinto Chief Complaint: constipation History of Present Illness:     65 year old woman who Has had constipation for 8-9 years.  Can go up to a week without a BM.  Mother gave her laxatives, helped after 3 days.  Recently bought stool softners hasn't tried them.  Never tried fiber supplements or miralax.  Has not had overt bleeding.  she had a colonoscopy by Dr. Ulis Rias 2002. Several polyps were removed one of which was proven to be an adenomatous polyp. She a repeat colonoscopy November of 2004 and no polyps were seen at that point. She was recommended to have a repeat colonoscopy at 5 year and interval.  Lays in bed alot, watches TV doesn't get around very well since stroke 2000.  Has been gaining weight.             Updated Prior Medication List: FLUOXETINE HCL 20 MG CAPS (FLUOXETINE HCL) Take 3 tab daily BUDEPRION SR 150 MG XR12H-TAB (BUPROPION HCL) 2 tab AM and 1 at noon CLONAZEPAM ODT 1 MG TBDP (CLONAZEPAM) take 1 three times a day OXYBUTYNIN CHLORIDE 5 MG XR24H-TAB (OXYBUTYNIN CHLORIDE) Take 1 tab two times a day METFORMIN HCL 500 MG TABS (METFORMIN HCL) 4 tab in the evening NABUMETONE 500 MG TABS (NABUMETONE) Take 2 tabs daily PRAVACHOL 40 MG TABS (PRAVASTATIN SODIUM) 1 tab at bedtime HYDROCHLOROTHIAZIDE 25 MG TABS (HYDROCHLOROTHIAZIDE) 1 tab am GLIMEPIRIDE 4 MG TABS (GLIMEPIRIDE) 1 tab three times a day ENALAPRIL MALEATE 20 MG TABS (ENALAPRIL MALEATE) 1 tab once daily LEVEMIR 100 UNIT/ML SOLN (INSULIN DETEMIR) take 1 injection 60 units  Current Allergies (reviewed today): ! SULFACETAMIDE (SULFACETAMIDE)  Past Medical History:    Reviewed history from 05/05/2008 and no changes required:       Alcoholism       Anxiety Disorder       Depression       GERD       Hypertension       Hypothyroidism        Obesity       hypomania       eating disorder       fibrocystic breast disease       djd  Past Surgical History:    Reviewed history from 05/05/2008 and no changes required:       Bladder Repair       Hysterectomy   Family History:    no colon cancer in family  Social History:    she is married, she has no children, she is on disability, she smokes 3 packs of cigarettes a day for many years, she does not drink alcohol, she does not drink caffeinated beverages.    Review of Systems       Pertinent positive and negative review of systems were noted in the above HPI and GI specific review of systems.  All other review of systems was otherwise negative.    Vital Signs:  Patient Profile:   65 Years Old Female Height:     64 inches Weight:      195 pounds BMI:     33.59 Pulse rate:   68 / minute BP sitting:   108 / 78  (left arm)  Vitals Entered By: Marisue Humble CMA (June 02, 2008 10:35 AM)  Physical Exam  Constitutional: generally well appearing Psychiatric: alert and oriented times 3 Eyes: extraocular movements intact Mouth: oropharynx moist, no lesions Neck: supple, no lymphadenopathy Cardiovascular: heart regular rate and rythm Lungs: CTA bilaterally Abdomen: soft, non-tender, non-distended, no obvious ascites, no peritoneal signs, normal bowel sounds Extremities: no lower extremity edema bilaterally Skin: no lesions on visible extremities     Impression & Recommendations:  Problem # 1:  CONSTIPATION (ICD-564.00) Her constipation is probably from Limited mobility, medications can be contributing such as oxybutynin.  she is never tried fiber supplements and so I recommended she begin taking fiber supplements on a daily basis. If this is not helpful then would progress to MiraLax.  Problem # 2:  PERSONAL HX COLONIC POLYPS (ICD-V12.72) adenomatous colon polyps removed in 2000. She is due for surveillance colonoscopy and we will arrange  that to be done at her soonest convenience. I see no reason for any further blood tests or imaging studies prior to then.   Patient Instructions: 1)  You should begin taking citrucel powder fiber supplement (orange flavor).  Start with a small spoonful and increase this over 1 week to a full, heaping spoonful daily.  You may notice some bloating when you first start the fiber, but that usually resolves after a few days.  If you don't like powder supplement, take 2 metamucil or citrucel pills a day. 2)  Excercise will help your constipation. 3)  You will be scheduled to have a colonoscopy. 4)  A copy of this information will be sent to Dr. Melford Aase. 5)  Try to stop smoking.    ]

## 2010-09-19 NOTE — Progress Notes (Signed)
Summary: COL RESULTS  Phone Note Call from Patient Call back at Home Phone [phone]   Caller: Patient Call For: Kinesha Auten Reason for Call: Lab or Test Results Details for Reason: COL RESULTS Summary of Call: HAD COL THIS MORNING...? RE THE REPORT Initial call taken by: Lucien Mons,  July 10, 2008 2:34 PM  Follow-up for Phone Call        Unable to reach patient unable to lm no answering machine. Georgianne Fick, RN  July 10, 2008 3:06 PM Was able to answer questions from colonoscopy report.  Patient is now waiting on pathology report .  Had questions on why she would have to wait 5 years for another colonoscopy. Follow-up by: Georgianne Fick, RN,  July 10, 2008 3:16 PM

## 2010-09-19 NOTE — Procedures (Signed)
Summary: Instructions for procedure/[place]  Instructions for procedure/[place]   Imported By: Phillis Knack 08/29/2010 12:00:52  _____________________________________________________________________  External Attachment:    Type:   Image     Comment:   External Document

## 2010-09-25 NOTE — Procedures (Signed)
Summary: Upper Endoscopy  Patient: Ashley [name] Note: All result statuses are Final unless otherwise noted.  Tests: (1) Upper Endoscopy (EGD)   EGD Upper Endoscopy       DONE     [facility]     [place], [place]  [address]           ENDOSCOPY PROCEDURE REPORT           PATIENT:  [name], [name]  MR#:  [id number]     BIRTHDATE:  03-04-1946, 64 yrs. old  GENDER:  female     ENDOSCOPIST:  [name], MD     PROCEDURE DATE:  09/19/2010     PROCEDURE:  EGD with biopsy, [id number]     ASA CLASS:  Class II     INDICATIONS:  mild dysphagia     MEDICATIONS:   MAC sedation, administered by CRNA     TOPICAL ANESTHETIC:  none     DESCRIPTION OF PROCEDURE:   After the risks benefits and     alternatives of the procedure were thoroughly explained, informed     consent was obtained.  The  endoscope was introduced through the     mouth and advanced to the second portion of the duodenum, without     limitations.  The instrument was slowly withdrawn as the mucosa     was fully examined.     <<PROCEDUREIMAGES>>     There was mild, non-specific gastritis. This was biopsies     (pathology jar 1) (see image2).  Otherwise the examination was     normal (see image1, image3, and image4).    Retroflexed views     revealed no abnormalities.    The scope was then withdrawn from     the patient and the procedure completed.     COMPLICATIONS:  None           ENDOSCOPIC IMPRESSION:     1) Mild gastritis     2) Otherwise normal examination; no esophagitis, no esophageal     narrowings           RECOMMENDATIONS:     Should stay on PPI once daily.  Over the counter prilosec or     prevacid or generic equivalent are all good.  Best to take one     pill 20-30 min before breakfast meal.     If biopsies show H. pylori, you will be started on appropriate     antibiotics.           ______________________________     [name], MD           cc: Unk Pinto           n.     eSIGNED:   [name] at 09/19/2010 09:36 AM           Cundari, Naydean, Kinnie [id number]  Note: An exclamation mark (!) indicates a result that was not dispersed into the flowsheet. Document Creation Date: 09/19/2010 9:36 AM _______________________________________________________________________  (1) Order result status: Final Collection or observation date-time: 09/19/2010 09:31 Requested date-time:  Receipt date-time:  Reported date-time:  Referring Physician:   Ordering Physician: Owens Loffler [phone]) Specimen Source:  Source: Tawanna Cooler Order Number: [id number] Lab site:

## 2010-11-13 LAB — GLUCOSE, CAPILLARY: Glucose-Capillary: 142 mg/dL — ABNORMAL HIGH (ref 70–99)
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
## 2011-01-03 NOTE — H&P (Signed)
[place]  Patient:    Ashley [name], Ashley [name]                         MRN: [id number] Adm. Date:  [id number] Disc. Date: [id number] Attending:  Earlie Raveling Dictator:   Gala Murdoch, N.P.                         History and Physical  IDENTIFYING INFORMATION:  Mrs. Check is a 65 year old white married female admitted April 23, 2000 on a voluntary basis after overdosing on 60 1 mg Klonopin, six 20 mg Accupril and six 500 mg Glucophage tablets on April 20, 2000.  She was admitted to the intensive care unit after the overdose for treatment and this was considered a serious suicide attempt.  HISTORY OF PRESENT ILLNESS:  The patient reports that she reports on Monday about noon.  Apparently, her boyfriend broke up with with her on Monday, April 20, 2000.  She apparently went home and overdosed, wrote a note to her husband, i.e. a suicide note.  Her husband came home unexpectedly an and found her and called EMS who took her to [facility] for treatment.  Again, she was admitted to the intensive care unit until transferred to the [facility] on April 23, 2000.  At the time she states she wanted to die; she thought this would be better for everyone, including her husband and her boyfriend.  Now she wants to live.  She denies being suicidal.  She states she is a compulsive eater, a "compulsive everything". She states she just eats certain things done certain ways.  She reports that she regrets the overdose now.  Sleep prior to the overdose, she says she sleeps mainly during the day. appetite has increased, however, she says her weight is stable. She says her manic symptoms have been worse recently.  She states she has been depressed. She feels guilty about her job and the affair she was having.  She identifies stressors as being laid off work October 2000 and her severance pay ran out in July 2001.  Apparently, she hs been having an affair with a  boyfriend, who she says is "impotent."  He broke up with her on Monday.  Apparently the husband did know about the affair for about two months.  He did condone the affair since the man was impotent.  Apparently the boyfriend broke up with her on Monday and that is when she took her overdose.  She states she loves both men, but she loves her husband and plans to stay with him.  She did state, however, her husband was letting her see the boyfriend.  The boyfriend broke up the relationship.  When the patient came on the unit she was hypomanic with pressured speech, would not go to sleep and was difficult to control and she was placed on a one-to-one.  PAST PSYCHIATRIC HISTORY:  The patient states she has never had psychiatric hospitalization.  This is her first psychiatric admission.  She has been seeing Thayer Dallas, a psychologist, here in town for 15 years.  She also sees Dr. Lajuana Ripple for 5-8 years for medication management.  PAST MEDICAL HISTORY:  The patients primary care doctor currently is unknown.  Medical problems include hypertension, diabetes mellitus type 2, hypercholesterolemia, a hiatal hernia, gastroesophageal reflux disease, a history of colon polyps, fibrocystic breast disease, esophageal spasms, degenerative joint disease, lumbar sprain, hypothyroidism, history  of asthma, stress and urge incontinence, seasonal allergies and a partial vulvectomy in December 1999.  She has also had hysterectomy.  MEDICATIONS:  Accupril 20 mg q.d., Klonopin 1 mg t.i.d.  She states she mainly only takes two a day. Prozac 20 mg 2 q.d., Lopid 600 mg b.i.d., Glucophage 500 mg q.d., calcium 500 mg t.i.d., multivitamins 1 q.d., aspirin 325 mg q.d.  She is on an agent for hypercholesterolemia, however, she does not know the name of it or the dose.  Her husband will bring up her medications.  Also, she is on hormone supplement.  She does not know the name of this or the dose. Again, her husband will  bring this to [place].  ALLERGIES:  SULFA.  SOCIAL HISTORY:  She has been married three times.  Her first marriage lasted for ten months.  She said she married out of spite.  A second marriage lasted for five years since he was seeing another woman.  This is her third marriage. She has been married 15 years.  She has no children.  She completed the 12th grade.  She has one brother, she states she does not get along with him.  Her parents are living here in [place].  They are elderly but she says they are very supportive.  Her husband is retired but he also works.  She says she was working at [employer] for 1-1/2 weeks at $8.75 an hour.  She states she plans to go back to work because she needs the financial support.  FAMILY HISTORY:  None, according to patient.  ALCOHOL AND DRUG HISTORY:  She denies current alcohol abuse, denies substance abuse.  She says she smokes three packs of cigarettes a day.  Years ago she did drink and she has been sober since 28.  POSITIVE PHYSICAL FINDINGS:  Please see physical examination done at [facility] and intensive care unit from April 20 2000 to April 22 2000.  She was treated by Dr. Susa Griffins at [facility].  On September 4, her potassium was low at 3.3 and she was supplemented with potassium, glucose high at 128, total protein down at 5.7, albumin down at 3.1.  Her CBC with diff was within normal limits.  Her white blood count was normal at 7.3.  Her EKG was within normal limits.  CURRENT MENTAL STATUS EXAMINATION:  An overweight white female dressed casually and cooperative.  She has difficulty keeping her eyes open due to some drowsiness.  She was up all night.  Speech is pressured at times but it is relevant.  Mood is expansive.  Her affect right now is blunted due to her drowsiness.  She denies current suicidal ideation, denies homicidal ideation. Thought processes - some signs and symptoms of mania, no sleep last night,  was hypomanic with pressured speech.  Cognitive - slightly drowsy currently, but she is oriented.  Her judgment is poor, insight is poor and she has poor impulse control.  CURRENT DIAGNOSES:  Axis I:     1. Bipolar disorder, mixed.             2. Obsessive compulsive disorder. Axis II:    Traits of hysterical personality disorder. Axis III:   1. Hypertension.             2. Diabetes mellitus, type 2.             3. Hypercholesterolemia.             4. Gastroesophageal reflux  disease.             5. Hiatal hernia.             6. Stress and urge incontinence.             7. History of a cerebrovascular accident in 24. Axis IV:    Severe, related to problems with her husband, boyfriend, economic             problems and her mental illness and medical problems. Axis V:     Current global assessment of functioning 30, highest in past year             10.  TREATMENT PLAN AND RECOMMENDATION:  Voluntary admission to [facility].  Check every 15 minutes, maintain safety.  The patient is able to contract for safety.  We will take her off the one-to-one supervision.  Continue her Accupril 20 mg q.d., Klonopin 1 mg p.o. t.i.d., Ativan 10 mg p.o. h.s. p.r.n. for sleep.  Glucophage 500 mg p.o. q.d., calcium 500 mg t.i.d., multivitamin 1 tab q.d., aspirin 325 mg q.d.  We need to find out from her husband the name and dosages of her current cholesterol medication as well as her hormone medication.  We have ordered a serum amylase and are going to start her on Depakote ER, 500 mg in the morning and 1000 mg at hs. to stabilize her mood.  We are going to hold the Prozac for now.  TENTATIVE LENGTH OF STAY AND DISCHARGE PLAN:  Three days. DD:  04/23/00 TD:  04/23/00 Job: [id number]

## 2011-05-20 LAB — GLUCOSE, CAPILLARY
Glucose-Capillary: 104 — ABNORMAL HIGH
Glucose-Capillary: 133 — ABNORMAL HIGH

## 2011-08-16 ENCOUNTER — Emergency Department (HOSPITAL_COMMUNITY)
Admission: EM | Admit: 2011-08-16 | Discharge: 2011-08-16 | Disposition: A | Discharge status: Home / Self Care | Payer: Medicare Other | Source: Home / Self Care | Attending: Emergency Medicine | Admitting: Emergency Medicine

## 2011-08-16 ENCOUNTER — Emergency Department (INDEPENDENT_AMBULATORY_CARE_PROVIDER_SITE_OTHER): Payer: Medicare Other

## 2011-08-16 DIAGNOSIS — J40 Bronchitis, not specified as acute or chronic: Secondary | ICD-10-CM

## 2011-08-16 HISTORY — DX: Bipolar disorder, unspecified: F31.9

## 2011-08-16 HISTORY — DX: Obsessive-compulsive disorder, unspecified: F42.9

## 2011-08-16 HISTORY — DX: Essential (primary) hypertension: I10

## 2011-08-16 HISTORY — DX: Pure hypercholesterolemia, unspecified: E78.00

## 2011-08-16 HISTORY — DX: Major depressive disorder, single episode, unspecified: F32.9

## 2011-08-16 HISTORY — DX: Depression, unspecified: F32.A

## 2011-08-16 HISTORY — DX: Cerebral infarction, unspecified: I63.9

## 2011-08-16 MED ORDER — HYDROCODONE-ACETAMINOPHEN 7.5-500 MG/15ML PO SOLN: 5.0000 mL | Freq: Four times a day (QID) | ORAL | Status: AC | PRN

## 2011-08-16 MED ORDER — FLUTICASONE PROPIONATE 50 MCG/ACT NA SUSP: 2.0000 | Freq: Every day | NASAL | Status: DC

## 2011-08-16 MED ORDER — DEXAMETHASONE 4 MG PO TABS: ORAL_TABLET | ORAL | Status: AC

## 2011-08-16 MED ORDER — ALBUTEROL SULFATE HFA 108 (90 BASE) MCG/ACT IN AERS: 1.0000 | INHALATION_SPRAY | Freq: Four times a day (QID) | RESPIRATORY_TRACT | Status: DC | PRN

## 2011-08-16 NOTE — ED Notes (Signed)
Pt has cough, sorethroat, bodyaches, and RUQ pain from coughing since 08-11-11.

## 2011-08-16 NOTE — ED Provider Notes (Signed)
History     CSN: [id number]  Arrival date & time 08/16/11  1056   First MD Initiated Contact with Patient 08/16/11 1245      Chief Complaint  Patient presents with  . Cough    HPI Comments: Pt with nonproductive cough, wheeze, SOB, achy R lower Cp after coughing x 5 days. C/o irritated throat. States is "sore" and fatigued due to all of the coughing. C/o clear rhinorrhea. No fevers, N/V, ear pain, ear fullness, sinus pain/pressure, hemoptysis. Unable to sleep at night secondary to coughing. H/o asthma and has "2 medicines" at home for this but does not know what they are for or how to use them. Is  longterm heavy smoker- smoking 3 ppd since age 39.   Patient is a 65 y.o. female presenting with cough. The history is provided by the patient and a parent. No language interpreter was used.  Cough This is a new problem. The current episode started more than 2 days ago. The problem occurs constantly. The problem has been gradually worsening. The cough is non-productive. There has been no fever. Associated symptoms include rhinorrhea, sore throat, myalgias, shortness of breath and wheezing. Pertinent negatives include no chest pain, no chills, no sweats, no ear congestion, no ear pain, no headaches and no eye redness. She has tried nothing for the symptoms. She is a smoker. Her past medical history is significant for asthma.    Past Medical History  Diagnosis Date  . Diabetes mellitus   . Asthma   . Hypertension   . Bipolar 1 disorder   . Hypercholesteremia   . Stroke   . Gout   . Obesity   . ADD (attention deficit disorder)   . OCD (obsessive compulsive disorder)   . Depression     Past Surgical History  Procedure Date  . Abdominal hysterectomy   . Bladder tack     History reviewed. No pertinent family history.  History  Substance Use Topics  . Smoking status: Current Everyday Smoker -- 3.0 packs/day    Types: Cigarettes  . Smokeless tobacco: Not on file  . Alcohol Use: No      OB History    Grav Para Term Preterm Abortions TAB SAB Ect Mult Living                  Review of Systems  Constitutional: Positive for fatigue. Negative for fever and chills.  HENT: Positive for congestion, sore throat, rhinorrhea and postnasal drip. Negative for ear pain, trouble swallowing and voice change.   Eyes: Negative for redness.  Respiratory: Positive for cough, chest tightness, shortness of breath and wheezing. Negative for stridor.   Cardiovascular: Negative for chest pain.  Gastrointestinal: Negative for nausea and vomiting.  Musculoskeletal: Positive for myalgias.  Skin: Negative for rash.  Neurological: Negative for headaches.    Allergies  Sulfacetamide sodium  Home Medications   Current Outpatient Rx  Name Route Sig Dispense Refill  . CLONAZEPAM 2 MG PO TABS Oral Take 2 mg by mouth 2 (two) times daily as needed.      . COLCHICINE 0.6 MG PO TABS Oral Take 0.6 mg by mouth daily.      . ENALAPRIL MALEATE 20 MG PO TABS Oral Take 20 mg by mouth daily.      Marland Kitchen FLUOXETINE HCL 40 MG PO CAPS Oral Take 40 mg by mouth daily.      Marland Kitchen HYDROCHLOROTHIAZIDE 25 MG PO TABS Oral Take 25 mg by mouth daily.      Marland Kitchen  INSULIN ISOPHANE & REGULAR (50-50) 100 UNIT/ML [place] SUSP Subcutaneous Inject into the skin 2 (two) times daily before a meal.      . MAGNESIUM GLUCONATE 500 MG PO TABS Oral Take 500 mg by mouth 2 (two) times daily.      Marland Kitchen METFORMIN HCL 1000 MG PO TABS Oral Take 1,000 mg by mouth 2 (two) times daily with a meal.      . NABUMETONE 750 MG PO TABS Oral Take 750 mg by mouth 2 (two) times daily.      Marland Kitchen RANITIDINE HCL 300 MG PO TABS Oral Take 300 mg by mouth at bedtime.      Marland Kitchen SIMVASTATIN 80 MG PO TABS Oral Take 80 mg by mouth at bedtime.      Marland Kitchen VITAMIN D (ERGOCALCIFEROL) 50000 UNITS PO CAPS Oral Take 50,000 Units by mouth.      . ALBUTEROL SULFATE HFA 108 (90 BASE) MCG/ACT IN AERS Inhalation Inhale 1-2 puffs into the lungs every 6 (six) hours as needed for wheezing. Dispense  with aerochamber 1 Inhaler 0  . DEXAMETHASONE 4 MG PO TABS  4 tabs po at once on day one, 4 tabs po at once on day 2 8 tablet 0  . FLUTICASONE PROPIONATE 50 MCG/ACT NA SUSP Nasal Place 2 sprays into the nose daily. 16 g 2  . HYDROCODONE-ACETAMINOPHEN 7.5-500 MG/15ML PO SOLN Oral Take 5 mLs by mouth every 6 (six) hours as needed for pain. 120 mL 0    BP 128/95  Pulse 97  Temp(Src) 99.6 F (37.6 C) (Oral)  Resp 18  SpO2 95%  Physical Exam  Nursing note and vitals reviewed. Constitutional: She is oriented to person, place, and time. She appears well-developed and well-nourished. No distress.       Speaking in full sentences  HENT:  Head: Normocephalic and atraumatic.  Nose: Mucosal edema and rhinorrhea present. Right sinus exhibits no maxillary sinus tenderness and no frontal sinus tenderness. Left sinus exhibits no maxillary sinus tenderness and no frontal sinus tenderness.  Mouth/Throat: Uvula is midline, oropharynx is clear and moist and mucous membranes are normal.  Eyes: EOM are normal. Pupils are equal, round, and reactive to light.  Neck: Normal range of motion. Neck supple.  Cardiovascular: Normal rate, regular rhythm and normal heart sounds.   Pulmonary/Chest: Effort normal and breath sounds normal. No respiratory distress. She has no wheezes. She has no rales. She exhibits tenderness.       Poor air movement. Mild right lower rib tenderness at insertion of abd muscles. No crepitus.   Abdominal: Soft. Bowel sounds are normal. She exhibits no distension.       Mild upper abd tenderness  Musculoskeletal: Normal range of motion.  Neurological: She is alert and oriented to person, place, and time.  Skin: Skin is warm and dry.  Psychiatric: She has a normal mood and affect. Her behavior is normal. Judgment and thought content normal.    ED Course  Procedures (including critical care time)   Imaging reviewed by myself. Report per radiologist.  Labs Reviewed - No data to  display Dg Chest 2 View  08/16/2011  *RADIOLOGY REPORT*  Clinical Data: Shortness of breath and cough.  Right-sided chest pain from coughing.  CHEST - 2 VIEW  Comparison: 06/25/2010  Findings: There is peribronchial thickening consistent with bronchitis.  No infiltrates or effusions.  Heart size and vascularity are normal.  No osseous abnormality.  IMPRESSION: Bronchitic changes.  Original Report Authenticated By: Larey Seat,  M.D.     1. Bronchitis       MDM  Suspect emphysema/copd with 40+ year h/o smoking 3 packs a day. no radiographic or objective findings c/w PNA. Acceptable O2 sats, afebrile here. Will do bronchodilators, short course steriods, discussed need for f/u and possible eval by a pulmonologist for PFT's. Advised that steriods will elevate her glucose.    Cherly Beach, MD 08/16/11 1539

## 2012-03-06 ENCOUNTER — Emergency Department (HOSPITAL_COMMUNITY)
Admission: EM | Admit: 2012-03-06 | Discharge: 2012-03-06 | Disposition: A | Discharge status: Home / Self Care | Payer: Medicare Other | Source: Home / Self Care | Attending: Emergency Medicine | Admitting: Emergency Medicine

## 2012-03-06 ENCOUNTER — Encounter (HOSPITAL_COMMUNITY): Payer: Self-pay | Admitting: Nurse Practitioner

## 2012-03-06 DIAGNOSIS — K5900 Constipation, unspecified: Secondary | ICD-10-CM

## 2012-03-06 LAB — POCT URINALYSIS DIP (DEVICE)
Glucose, UA: 100 mg/dL — AB
Hgb urine dipstick: NEGATIVE
Ketones, ur: NEGATIVE mg/dL
Leukocytes, UA: NEGATIVE
Nitrite: NEGATIVE
Protein, ur: 30 mg/dL — AB
Specific Gravity, Urine: 1.02 (ref 1.005–1.030)
Urobilinogen, UA: 0.2 mg/dL (ref 0.0–1.0)
pH: 5 (ref 5.0–8.0)

## 2012-03-06 MED ORDER — LACTULOSE 10 GM/15ML PO SOLN: 20.0000 g | Freq: Three times a day (TID) | ORAL | Status: AC

## 2012-03-06 NOTE — ED Provider Notes (Addendum)
History     CSN: [id number]  Arrival date & time 03/06/12  1815   First MD Initiated Contact with Patient 03/06/12 1855      Chief Complaint  Patient presents with  . Urinary Retention    (Consider location/radiation/quality/duration/timing/severity/associated sxs/prior treatment) HPI Comments: Patient presents to [facility] describing that she has been unable to urinate since last night and have not had a bowel movement for several days" "about 3 " That is not unusual". No nausea, no vomiting No Fevers. I have not taken any medicines or anything new" " I have been taking a lot of fluids". "I just feel like he wanted clothing urinate and I just can't"  The history is provided by the patient.    Past Medical History  Diagnosis Date  . Diabetes mellitus   . Asthma   . Hypertension   . Bipolar 1 disorder   . Hypercholesteremia   . Stroke   . Gout   . Obesity   . ADD (attention deficit disorder)   . OCD (obsessive compulsive disorder)   . Depression   . Neuropathy     Past Surgical History  Procedure Date  . Abdominal hysterectomy   . Bladder tack     Family History  Problem Relation Age of Onset  . Stroke Father   . Heart failure Father     History  Substance Use Topics  . Smoking status: Current Everyday Smoker -- 3.0 packs/day    Types: Cigarettes  . Smokeless tobacco: Not on file  . Alcohol Use: No    OB History    Grav Para Term Preterm Abortions TAB SAB Ect Mult Living                  Review of Systems  Constitutional: Negative for fever and activity change.  Gastrointestinal: Positive for abdominal pain and constipation. Negative for nausea, diarrhea and anal bleeding.  Genitourinary: Negative for dysuria.       Urinary retention    Allergies  Sulfa antibiotics and Sulfacetamide sodium  Home Medications   Current Outpatient Rx  Name Route Sig Dispense Refill  . CLONAZEPAM 2 MG PO TABS Oral Take 2 mg by mouth 2 (two) times daily as needed.        . COLCHICINE 0.6 MG PO TABS Oral Take 0.6 mg by mouth daily.      . ENALAPRIL MALEATE 20 MG PO TABS Oral Take 20 mg by mouth daily.      Marland Kitchen FLUOXETINE HCL 40 MG PO CAPS Oral Take 40 mg by mouth daily.      Marland Kitchen HYDROCHLOROTHIAZIDE 25 MG PO TABS Oral Take 25 mg by mouth daily.      . INSULIN ISOPHANE & REGULAR (50-50) 100 UNIT/ML [place] SUSP Subcutaneous Inject into the skin 2 (two) times daily before a meal.      . METFORMIN HCL 1000 MG PO TABS Oral Take 1,000 mg by mouth 2 (two) times daily with a meal.      . SIMVASTATIN 80 MG PO TABS Oral Take 80 mg by mouth at bedtime.      Marland Kitchen VITAMIN D (ERGOCALCIFEROL) 50000 UNITS PO CAPS Oral Take 50,000 Units by mouth.      . ALBUTEROL SULFATE HFA 108 (90 BASE) MCG/ACT IN AERS Inhalation Inhale 1-2 puffs into the lungs every 6 (six) hours as needed for wheezing. Dispense with aerochamber 1 Inhaler 0  . FLUTICASONE PROPIONATE 50 MCG/ACT NA SUSP Nasal Place 2 sprays into the  nose daily. 16 g 2  . LACTULOSE 10 GM/15ML PO SOLN Oral Take 30 mLs (20 g total) by mouth 3 (three) times daily. 120 mL 0  . MAGNESIUM GLUCONATE 500 MG PO TABS Oral Take 500 mg by mouth 2 (two) times daily.      Marland Kitchen NABUMETONE 750 MG PO TABS Oral Take 750 mg by mouth 2 (two) times daily.      Marland Kitchen RANITIDINE HCL 300 MG PO TABS Oral Take 300 mg by mouth at bedtime.        BP 149/74  Pulse 89  Temp 97.5 F (36.4 C) (Oral)  Resp 20  SpO2 97%  Physical Exam  Nursing note and vitals reviewed. Constitutional: Vital signs are normal. She appears well-developed and well-nourished.  Non-toxic appearance. She does not have a sickly appearance. She does not appear ill. No distress.  HENT:  Head: Normocephalic.  Mouth/Throat: Uvula is midline, oropharynx is clear and moist and mucous membranes are normal.  Neck: Neck supple.  Abdominal: Soft. She exhibits no distension and no mass. There is no hepatosplenomegaly, splenomegaly or hepatomegaly. There is tenderness in the suprapubic area. There is no  rigidity, no rebound, no guarding and no CVA tenderness.  Musculoskeletal: She exhibits tenderness.  Neurological: She is alert.  Skin: Skin is warm. No rash noted. There is erythema. No pallor.       ED Course  Procedures (including critical care time)  Labs Reviewed  POCT URINALYSIS DIP (DEVICE) - Abnormal; Notable for the following:    Glucose, UA 100 (*)     Bilirubin Urine SMALL (*)     Protein, ur 30 (*)     All other components within normal limits   No results found.   1. Constipation       MDM  Two concerns:  1- Constipation (lactulose Rx to use for 48 hours ONLY) 2- Urinary retention ( Straight cath 100 cc) 3- Fall during [facility] visit. Mild superficial erythema- See Incident report generated by nurse.  Patient was otherwise about symptoms warrant further evaluation in the emergency department such as increased abdominal pain, fevers or vomiting. Post- fall exam was unremarkable.   I was approached by nursing staff reporting that patient fell as she slipped and sustained a minor injury to her right forearm and the posterior aspect of the right upper shoulder and left costal region. No hematoma, no ecchymosis, no deformities and no bone tenderness in any of the affected areas.          Rosana Hoes, MD 03/06/12 2040  Rosana Hoes, MD 03/06/12 2049

## 2012-03-06 NOTE — ED Notes (Addendum)
Pt slipped on floor and fell onto buttocks while attempting to replace clothing.  Repeatedly stating, "I'm fine.  It was my fault.  I fall all the time."  When asked if she is hurting anywhere, states, "I'm fine," then reports minimal discomfort to left mid-back - small area of redness noted.  Assisted into chair x 4 RNs.  After settling into chair and asked again if anything hurts, again stated she is fine, but has just minimal soreness to spot on right elbow (small, red line noted which dissipated after few moments), slight left shoulder soreness, and tiny red spot to left mid-back, which also dissipated after several moments.  No change in ROM, ambulated to restroom without difficulty with stand-by assistance of spouse per patient request.

## 2012-05-18 ENCOUNTER — Other Ambulatory Visit: Payer: Self-pay | Admitting: Gynecology

## 2012-06-10 ENCOUNTER — Other Ambulatory Visit: Payer: Self-pay | Admitting: Gynecology

## 2012-06-18 ENCOUNTER — Encounter (HOSPITAL_BASED_OUTPATIENT_CLINIC_OR_DEPARTMENT_OTHER): Payer: Self-pay | Admitting: *Deleted

## 2012-06-18 NOTE — Progress Notes (Signed)
NPO AFTER MN. ARRIVES AT 0615. NEEDS ISTAT AND EKG. WILL TAKE SYNTHROID, PRILOSEC, KLONIPIN AM OF SURG W/ SIP  OF WATER.

## 2012-06-21 ENCOUNTER — Encounter (HOSPITAL_BASED_OUTPATIENT_CLINIC_OR_DEPARTMENT_OTHER): Payer: Self-pay | Admitting: *Deleted

## 2012-06-22 ENCOUNTER — Ambulatory Visit (HOSPITAL_BASED_OUTPATIENT_CLINIC_OR_DEPARTMENT_OTHER)
Admission: RE | Admit: 2012-06-22 | Discharge: 2012-06-22 | Disposition: A | Discharge status: Home / Self Care | Payer: Medicare Other | Source: Ambulatory Visit | Attending: Gynecology | Admitting: Gynecology

## 2012-06-22 ENCOUNTER — Encounter (HOSPITAL_BASED_OUTPATIENT_CLINIC_OR_DEPARTMENT_OTHER): Payer: Self-pay | Admitting: Anesthesiology

## 2012-06-22 ENCOUNTER — Encounter (HOSPITAL_BASED_OUTPATIENT_CLINIC_OR_DEPARTMENT_OTHER): Payer: Self-pay

## 2012-06-22 ENCOUNTER — Ambulatory Visit (HOSPITAL_BASED_OUTPATIENT_CLINIC_OR_DEPARTMENT_OTHER): Payer: Medicare Other | Admitting: Anesthesiology

## 2012-06-22 ENCOUNTER — Encounter (HOSPITAL_BASED_OUTPATIENT_CLINIC_OR_DEPARTMENT_OTHER)
Admission: RE | Disposition: A | Discharge status: Home / Self Care | Payer: Self-pay | Source: Ambulatory Visit | Attending: Gynecology

## 2012-06-22 DIAGNOSIS — D071 Carcinoma in situ of vulva: Secondary | ICD-10-CM | POA: Insufficient documentation

## 2012-06-22 HISTORY — DX: Personal history of other diseases of the digestive system: Z87.19

## 2012-06-22 HISTORY — DX: Personal history of transient ischemic attack (TIA), and cerebral infarction without residual deficits: Z86.73

## 2012-06-22 HISTORY — DX: Anxiety disorder, unspecified: F41.9

## 2012-06-22 HISTORY — PX: VULVAR LESION REMOVAL: SHX5391

## 2012-06-22 HISTORY — DX: Gastro-esophageal reflux disease without esophagitis: K21.9

## 2012-06-22 HISTORY — DX: Shortness of breath: R06.02

## 2012-06-22 HISTORY — DX: Other specified noninflammatory disorders of vulva and perineum: N90.89

## 2012-06-22 HISTORY — DX: Unspecified mononeuropathy of unspecified lower limb: G57.90

## 2012-06-22 HISTORY — DX: Unspecified osteoarthritis, unspecified site: M19.90

## 2012-06-22 HISTORY — DX: Stress incontinence (female) (male): N39.3

## 2012-06-22 LAB — POCT I-STAT 4, (NA,K, GLUC, HGB,HCT)
Glucose, Bld: 202 mg/dL — ABNORMAL HIGH (ref 70–99)
HCT: 41 % (ref 36.0–46.0)
Hemoglobin: 13.9 g/dL (ref 12.0–15.0)
Potassium: 4.6 mEq/L (ref 3.5–5.1)
Sodium: 134 mEq/L — ABNORMAL LOW (ref 135–145)

## 2012-06-22 LAB — GLUCOSE, CAPILLARY: Glucose-Capillary: 152 mg/dL — ABNORMAL HIGH (ref 70–99)

## 2012-06-22 SURGERY — VULVAR LESION
Anesthesia: General | Site: Vagina | Wound class: Clean

## 2012-06-22 MED ORDER — LACTATED RINGERS IV SOLN
INTRAVENOUS | Status: DC
  Administered 2012-06-22 (×2): via INTRAVENOUS
  Filled 2012-06-22: qty 1000

## 2012-06-22 MED ORDER — BACITRACIN-NEOMYCIN-POLYMYXIN OINTMENT TUBE
TOPICAL_OINTMENT | CUTANEOUS | Status: DC | PRN
  Administered 2012-06-22: 1 via TOPICAL

## 2012-06-22 MED ORDER — BUPIVACAINE HCL 0.5 % IJ SOLN
INTRAMUSCULAR | Status: DC | PRN
  Administered 2012-06-22: 6 mL

## 2012-06-22 MED ORDER — KETOROLAC TROMETHAMINE 30 MG/ML IJ SOLN
INTRAMUSCULAR | Status: DC | PRN
  Administered 2012-06-22: 15 mg via INTRAVENOUS

## 2012-06-22 MED ORDER — LIDOCAINE HCL (CARDIAC) 20 MG/ML IV SOLN
INTRAVENOUS | Status: DC | PRN
  Administered 2012-06-22: 75 mg via INTRAVENOUS

## 2012-06-22 MED ORDER — PROPOFOL 10 MG/ML IV BOLUS
INTRAVENOUS | Status: DC | PRN
  Administered 2012-06-22: 250 mg via INTRAVENOUS

## 2012-06-22 MED ORDER — ONDANSETRON HCL 4 MG/2ML IJ SOLN
INTRAMUSCULAR | Status: DC | PRN
  Administered 2012-06-22: 4 mg via INTRAVENOUS

## 2012-06-22 MED ORDER — FENTANYL CITRATE 0.05 MG/ML IJ SOLN
INTRAMUSCULAR | Status: DC | PRN
  Administered 2012-06-22: 25 ug via INTRAVENOUS
  Administered 2012-06-22: 50 ug via INTRAVENOUS
  Administered 2012-06-22: 25 ug via INTRAVENOUS

## 2012-06-22 SURGICAL SUPPLY — 45 items
BLADE SURG 15 STRL LF DISP TIS (BLADE) ×1 IMPLANT
BLADE SURG 15 STRL SS (BLADE) ×1
CANISTER SUCTION 1200CC (MISCELLANEOUS) IMPLANT
CANISTER SUCTION 2500CC (MISCELLANEOUS) IMPLANT
CLOTH BEACON ORANGE TIMEOUT ST (SAFETY) ×2 IMPLANT
COVER TABLE BACK 60X90 (DRAPES) ×2 IMPLANT
DRAPE LG THREE QUARTER DISP (DRAPES) ×2 IMPLANT
DRAPE UNDERBUTTOCKS STRL (DRAPE) ×2 IMPLANT
ELECT NEEDLE TIP 2.8 STRL (NEEDLE) IMPLANT
ELECT REM PT RETURN 9FT ADLT (ELECTROSURGICAL) ×2
ELECTRODE REM PT RTRN 9FT ADLT (ELECTROSURGICAL) ×1 IMPLANT
GLOVE BIO SURGEON STRL SZ 6.5 (GLOVE) ×4 IMPLANT
GLOVE BIO SURGEON STRL SZ8 (GLOVE) ×4 IMPLANT
GOWN PREVENTION PLUS LG XLONG (DISPOSABLE) ×2 IMPLANT
GOWN STRL REIN XL XLG (GOWN DISPOSABLE) ×2 IMPLANT
LEGGING LITHOTOMY PAIR STRL (DRAPES) ×2 IMPLANT
NEEDLE HYPO 25X1 1.5 SAFETY (NEEDLE) ×2 IMPLANT
NEEDLE HYPO 25X5/8 SAFETYGLIDE (NEEDLE) IMPLANT
NS IRRIG 500ML POUR BTL (IV SOLUTION) ×2 IMPLANT
PACK BASIN DAY SURGERY FS (CUSTOM PROCEDURE TRAY) ×2 IMPLANT
PAD OB MATERNITY 4.3X12.25 (PERSONAL CARE ITEMS) ×2 IMPLANT
PAD PREP 24X48 CUFFED NSTRL (MISCELLANEOUS) ×2 IMPLANT
PENCIL BUTTON HOLSTER BLD 10FT (ELECTRODE) ×2 IMPLANT
SPONGE GAUZE 4X4 12PLY (GAUZE/BANDAGES/DRESSINGS) ×2 IMPLANT
STRIP CLOSURE SKIN 1/4X4 (GAUZE/BANDAGES/DRESSINGS) IMPLANT
SUT MON AB 5-0 P3 18 (SUTURE) IMPLANT
SUT VIC AB 3-0 PS2 18 (SUTURE)
SUT VIC AB 3-0 PS2 18XBRD (SUTURE) IMPLANT
SUT VIC AB 3-0 SH 27 (SUTURE)
SUT VIC AB 3-0 SH 27X BRD (SUTURE) IMPLANT
SUT VIC AB 4-0 SH 27 (SUTURE) ×1
SUT VIC AB 4-0 SH 27XANBCTRL (SUTURE) ×1 IMPLANT
SUT VIC AB 5-0 PS2 18 (SUTURE) IMPLANT
SUT VICRYL 4-0 PS2 18IN ABS (SUTURE) ×2 IMPLANT
SUT VICRYL 6 0 UNDY PS 6 (SUTURE) IMPLANT
SWAB CULTURE LIQ STUART DBL (MISCELLANEOUS) IMPLANT
SYR BULB IRRIGATION 50ML (SYRINGE) ×2 IMPLANT
SYR CONTROL 10ML LL (SYRINGE) ×2 IMPLANT
TAPE CLOTH SURG 4X10 WHT LF (GAUZE/BANDAGES/DRESSINGS) ×2 IMPLANT
TOWEL OR 17X24 6PK STRL BLUE (TOWEL DISPOSABLE) ×4 IMPLANT
TRAY DSU PREP LF (CUSTOM PROCEDURE TRAY) ×2 IMPLANT
TUBE ANAEROBIC SPECIMEN COL (MISCELLANEOUS) IMPLANT
TUBE CONNECTING 12X1/4 (SUCTIONS) IMPLANT
WATER STERILE IRR 500ML POUR (IV SOLUTION) IMPLANT
YANKAUER SUCT BULB TIP NO VENT (SUCTIONS) IMPLANT

## 2012-06-22 NOTE — Transfer of Care (Addendum)
Immediate Anesthesia Transfer of Care Note  Patient: Ashley [name]  Procedure(s) Performed: Procedure(s) (LRB): VULVAR LESION (N/A)  Patient Location: Patient transported to PACU with oxygen via face mask at 4 Liters / Min  Anesthesia Type: General  Level of Consciousness: awake and alert   Airway & Oxygen Therapy: Patient Spontanous Breathing and Patient connected to face mask oxygen  Post-op Assessment: Report given to PACU RN and Post -op Vital signs reviewed and stable  Post vital signs: Reviewed and stable  Dentition: Teeth and oropharynx remain in pre-op condition  Complications: No apparent anesthesia complications   Patient stating that she felt no pain and remembers what Dr. Ubaldo Glassing said under anesthesia. See pre-op evaluation notes.  This is what she said she would do to play a joke on Dr. Ubaldo Glassing.

## 2012-06-22 NOTE — Anesthesia Preprocedure Evaluation (Addendum)
Anesthesia Evaluation  Patient identified by MRN, date of birth, ID band Patient awake    Reviewed: Allergy & Precautions, H&P , NPO status , Patient's Chart, lab work & pertinent test results  Airway Mallampati: II TM Distance: <3 FB Neck ROM: Full    Dental No notable dental hx.    Pulmonary asthma , COPD COPD inhaler,  breath sounds clear to auscultation  + decreased breath sounds      Cardiovascular hypertension, Pt. on medications Rhythm:Regular Rate:Normal     Neuro/Psych Depression CVA    GI/Hepatic Neg liver ROS, GERD-  Medicated,  Endo/Other  diabetes, Insulin DependentMorbid obesity  Renal/GU negative Renal ROS  negative genitourinary   Musculoskeletal negative musculoskeletal ROS (+)   Abdominal   Peds negative pediatric ROS (+)  Hematology negative hematology ROS (+)   Anesthesia Other Findings 0720 Patient stated that she was going to play a joke on Dr. Ubaldo Glassing and she was going to tell him that she heard everything he said in the operating room while she was asleep. I discussed that this is serious issue and that should not be considered a joke.  She insisted that she was going to play this joke.  Jillyn Ledger, CRNA.  Reproductive/Obstetrics negative OB ROS                        Anesthesia Physical Anesthesia Plan  ASA: III  Anesthesia Plan: General   Post-op Pain Management:    Induction: Intravenous  Airway Management Planned: LMA  Additional Equipment:   Intra-op Plan:   Post-operative Plan:   Informed Consent: I have reviewed the patients History and Physical, chart, labs and discussed the procedure including the risks, benefits and alternatives for the proposed anesthesia with the patient or authorized representative who has indicated his/her understanding and acceptance.   Dental advisory given  Plan Discussed with: CRNA and Surgeon  Anesthesia Plan Comments:          Anesthesia Quick Evaluation

## 2012-06-22 NOTE — Anesthesia Postprocedure Evaluation (Signed)
  Anesthesia Post-op Note  Patient: Ashley [name]  Procedure(s) Performed: Procedure(s) (LRB): VULVAR LESION (N/A)  Patient Location: PACU  Anesthesia Type: General  Level of Consciousness: awake and alert   Airway and Oxygen Therapy: Patient Spontanous Breathing  Post-op Pain: mild  Post-op Assessment: Post-op Vital signs reviewed, Patient's Cardiovascular Status Stable, Respiratory Function Stable, Patent Airway and No signs of Nausea or vomiting  Post-op Vital Signs: stable  Complications: No apparent anesthesia complications

## 2012-06-22 NOTE — Anesthesia Procedure Notes (Signed)
Procedure Name: LMA Insertion Date/Time: 06/22/2012 7:38 AM Performed by: Christiana Fuchs Pre-anesthesia Checklist: Patient identified, Emergency Drugs available, Suction available and Patient being monitored Patient Re-evaluated:Patient Re-evaluated prior to inductionOxygen Delivery Method: Circle System Utilized Preoxygenation: Pre-oxygenation with 100% oxygen Intubation Type: IV induction Ventilation: Mask ventilation without difficulty LMA: LMA inserted LMA Size: 4.0 Number of attempts: 1 Airway Equipment and Method: bite block Placement Confirmation: positive ETCO2 Tube secured with: Tape Dental Injury: Teeth and Oropharynx as per pre-operative assessment

## 2012-06-22 NOTE — OR Nursing (Signed)
06-22-2012 0720 OR Nursing - Prior to my pre-op assessment patient stated that she wanted to play a joke on Dr. Ubaldo Glassing stated that she was going to tell him that she heard everything said in the operating room. CRNA, Jillyn Ledger and myself stated that saying that is not appropriate because we take this seriously and want the best care for our patients.   06-22-2012 [id number] Prior to patient entering room induction I informed Surgical tech Ruthe Mannan of the patients plans and prior to general induction I informed Dr. Ubaldo Glassing of patients plans and  watched anesthesia place the BIS monitor on the patient to make sure she was not at all awake during procedure. No conversation about the patient was made during the surgery.

## 2012-06-23 ENCOUNTER — Encounter (HOSPITAL_BASED_OUTPATIENT_CLINIC_OR_DEPARTMENT_OTHER): Payer: Self-pay | Admitting: Gynecology

## 2012-06-23 NOTE — Op Note (Signed)
NAME:  Ashley [name], Ashley [name]                  ACCOUNT NO.:  [id number]  MEDICAL RECORD NO.:  [id number]  LOCATION:                                 FACILITY:  PHYSICIAN:  Selinda Orion, M.D. 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  PROCEDURE:  Wide excision of VIN III.  SURGEON:  Selinda Orion, M.D.  ANESTHESIA:  IV sedation with local infiltration, Marcaine 0.5%.  COMPLICATIONS:  None.  SPECIMEN:  Tissue to Path.  DESCRIPTION OF PROCEDURE:  Under anesthesia as above with the patient prepped and draped in [place] with intermittent compression stockings on and functioning, the lesion was outlined by marker and so as to have approximately 1 cm around the lesion of clear margin.  The previous biopsy site was well healed.  At this point, the lesion was infiltrated with Marcaine with the patient well anesthetized with IV sedation.  The incision was made following the marker line so as to get adequate margins.  The incision was carried through the entire full thickness into the subcutaneous tissue, so as to be certain that the lesion was contained entirely in the excision.  The subcutaneous tissue was then approximated with interrupted sutures of 4-0 Vicryl.  The skin edge was then approximated with a running suture of 4-0 Vicryl as a subcuticular cutting needle.  With the lesion well approximated, it was coated with ointment so as to prevent penetration as a water barrier particularly on the peritoneal surface.  Bleeding was completely controlled.          ______________________________ Selinda Orion, M.D.     CWL/MEDQ  D:  06/22/2012  T:  06/22/2012  Job:   [id number]  cc:   Unk Pinto, M.D. Fax: [phone]

## 2012-06-24 ENCOUNTER — Encounter (HOSPITAL_BASED_OUTPATIENT_CLINIC_OR_DEPARTMENT_OTHER): Payer: Self-pay

## 2012-07-07 ENCOUNTER — Encounter: Payer: Self-pay | Admitting: Gynecologic Oncology

## 2012-07-07 ENCOUNTER — Ambulatory Visit: Payer: Medicare Other | Attending: Gynecologic Oncology | Admitting: Gynecologic Oncology

## 2012-07-07 VITALS — BP 118/68 | HR 64 | Temp 97.6°F | Resp 18 | Ht 64.0 in | Wt 210.3 lb

## 2012-07-07 DIAGNOSIS — C519 Malignant neoplasm of vulva, unspecified: Secondary | ICD-10-CM | POA: Insufficient documentation

## 2012-07-07 HISTORY — DX: Malignant neoplasm of vulva, unspecified: C51.9

## 2012-07-07 NOTE — Patient Instructions (Signed)
RTC 6 months
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
## 2012-10-25 DIAGNOSIS — D518 Other vitamin B12 deficiency anemias: Secondary | ICD-10-CM | POA: Insufficient documentation

## 2012-10-25 DIAGNOSIS — G5600 Carpal tunnel syndrome, unspecified upper limb: Secondary | ICD-10-CM | POA: Insufficient documentation

## 2012-10-25 DIAGNOSIS — E1140 Type 2 diabetes mellitus with diabetic neuropathy, unspecified: Secondary | ICD-10-CM | POA: Insufficient documentation

## 2012-12-21 ENCOUNTER — Other Ambulatory Visit: Payer: Self-pay | Admitting: Neurology

## 2012-12-21 DIAGNOSIS — M545 Low back pain, unspecified: Secondary | ICD-10-CM

## 2012-12-23 ENCOUNTER — Ambulatory Visit: Payer: Medicare Other | Admitting: Gynecologic Oncology

## 2012-12-31 DIAGNOSIS — M545 Low back pain, unspecified: Secondary | ICD-10-CM

## 2013-01-03 ENCOUNTER — Other Ambulatory Visit: Payer: Self-pay | Admitting: Neurology

## 2013-01-03 ENCOUNTER — Telehealth: Payer: Self-pay | Admitting: Neurology

## 2013-01-03 DIAGNOSIS — M545 Low back pain, unspecified: Secondary | ICD-10-CM

## 2013-01-03 MED ORDER — GABAPENTIN 300 MG PO CAPS: 300.0000 mg | ORAL_CAPSULE | Freq: Two times a day (BID) | ORAL | Status: DC

## 2013-01-03 NOTE — Telephone Encounter (Signed)
I called patient. The MRI of the lumbosacral spine shows some disc herniations that are central at the L4 and L5-S1 levels, possibly impinging the left S1 nerve root. No changes are seen from 2008. The patient does have a mild peripheral neuropathy, and she mainly complains of leg discomfort, with only occasional back pain. The patient does not wish to consider epidural steroid injections. I'll place the patient on gabapentin.

## 2013-01-04 ENCOUNTER — Telehealth: Payer: Self-pay | Admitting: Gynecologic Oncology
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
## 2013-01-06 ENCOUNTER — Ambulatory Visit: Payer: Medicare Other | Attending: Gynecologic Oncology | Admitting: Gynecologic Oncology

## 2013-01-06 ENCOUNTER — Encounter: Payer: Self-pay | Admitting: Gynecologic Oncology

## 2013-01-06 VITALS — BP 118/88 | HR 92 | Temp 98.3°F | Resp 18 | Ht 64.0 in | Wt 199.7 lb

## 2013-01-06 DIAGNOSIS — E7800 Pure hypercholesterolemia, unspecified: Secondary | ICD-10-CM | POA: Insufficient documentation

## 2013-01-06 DIAGNOSIS — E1149 Type 2 diabetes mellitus with other diabetic neurological complication: Secondary | ICD-10-CM | POA: Insufficient documentation

## 2013-01-06 DIAGNOSIS — F17200 Nicotine dependence, unspecified, uncomplicated: Secondary | ICD-10-CM | POA: Insufficient documentation

## 2013-01-06 DIAGNOSIS — R39198 Other difficulties with micturition: Secondary | ICD-10-CM | POA: Insufficient documentation

## 2013-01-06 DIAGNOSIS — I10 Essential (primary) hypertension: Secondary | ICD-10-CM | POA: Insufficient documentation

## 2013-01-06 DIAGNOSIS — R198 Other specified symptoms and signs involving the digestive system and abdomen: Secondary | ICD-10-CM | POA: Insufficient documentation

## 2013-01-06 DIAGNOSIS — N3946 Mixed incontinence: Secondary | ICD-10-CM | POA: Insufficient documentation

## 2013-01-06 DIAGNOSIS — J45909 Unspecified asthma, uncomplicated: Secondary | ICD-10-CM | POA: Insufficient documentation

## 2013-01-06 DIAGNOSIS — C519 Malignant neoplasm of vulva, unspecified: Secondary | ICD-10-CM | POA: Insufficient documentation

## 2013-01-06 DIAGNOSIS — I69919 Unspecified symptoms and signs involving cognitive functions following unspecified cerebrovascular disease: Secondary | ICD-10-CM | POA: Insufficient documentation

## 2013-01-06 DIAGNOSIS — Z90710 Acquired absence of both cervix and uterus: Secondary | ICD-10-CM | POA: Insufficient documentation

## 2013-01-06 DIAGNOSIS — E1142 Type 2 diabetes mellitus with diabetic polyneuropathy: Secondary | ICD-10-CM | POA: Insufficient documentation
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
## 2013-01-07 ENCOUNTER — Telehealth: Payer: Self-pay | Admitting: Gynecologic Oncology

## 2013-01-07 NOTE — Telephone Encounter (Signed)
Message left informing patient of upcoming appointment with Dr. Ardis Hughs to evaluate black stools and change in consistency of bowel movements per Dr. Guy Sandifer.  Appointment scheduled for February 01, 2013 at 03:00pm.  Instructed to call the office for any questions or concerns.

## 2013-01-12 LAB — FECAL OCCULT BLOOD, GUAIAC: Fecal Occult Blood: NEGATIVE

## 2013-02-01 ENCOUNTER — Encounter: Payer: Self-pay | Admitting: Gastroenterology

## 2013-02-01 ENCOUNTER — Ambulatory Visit (INDEPENDENT_AMBULATORY_CARE_PROVIDER_SITE_OTHER): Payer: Medicare Other | Admitting: Gastroenterology

## 2013-02-01 ENCOUNTER — Other Ambulatory Visit (INDEPENDENT_AMBULATORY_CARE_PROVIDER_SITE_OTHER): Payer: Medicare Other

## 2013-02-01 VITALS — BP 110/70 | HR 90 | Ht 64.0 in | Wt 194.0 lb

## 2013-02-01 DIAGNOSIS — R198 Other specified symptoms and signs involving the digestive system and abdomen: Secondary | ICD-10-CM

## 2013-02-01 DIAGNOSIS — R195 Other fecal abnormalities: Secondary | ICD-10-CM

## 2013-02-01 DIAGNOSIS — R194 Change in bowel habit: Secondary | ICD-10-CM

## 2013-02-01 LAB — CBC WITH DIFFERENTIAL/PLATELET
Basophils Absolute: 0.1 10*3/uL (ref 0.0–0.1)
Basophils Relative: 0.6 % (ref 0.0–3.0)
Eosinophils Absolute: 0.2 10*3/uL (ref 0.0–0.7)
Eosinophils Relative: 1.9 % (ref 0.0–5.0)
HCT: 42.5 % (ref 36.0–46.0)
Hemoglobin: 14.2 g/dL (ref 12.0–15.0)
Lymphocytes Relative: 23.4 % (ref 12.0–46.0)
Lymphs Abs: 2.6 10*3/uL (ref 0.7–4.0)
MCHC: 33.4 g/dL (ref 30.0–36.0)
MCV: 92.2 fl (ref 78.0–100.0)
Monocytes Absolute: 0.6 10*3/uL (ref 0.1–1.0)
Monocytes Relative: 5.2 % (ref 3.0–12.0)
Neutro Abs: 7.6 10*3/uL (ref 1.4–7.7)
Neutrophils Relative %: 68.9 % (ref 43.0–77.0)
Platelets: 277 10*3/uL (ref 150.0–400.0)
RBC: 4.62 Mil/uL (ref 3.87–5.11)
RDW: 14.3 % (ref 11.5–14.6)
WBC: 11.1 10*3/uL — ABNORMAL HIGH (ref 4.5–10.5)

## 2013-02-01 MED ORDER — MOVIPREP 100 G PO SOLR: ORAL | Status: DC
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
## 2013-02-01 NOTE — Patient Instructions (Addendum)
One of your biggest health concerns is your smoking.  This increases your risk for most cancers and serious cardiovascular diseases such as strokes, heart attacks.  You should try your best to stop.  If you need assistance, please contact your PCP or Smoking Cessation Class at [facility] [phone]) or [place] (1-800-QUIT-NOW). You will be set up for a colonoscopy for black, thin stools. (MAC sedation, LEC) You will be set up for an upper endoscopy if the colonoscopy is unrevealing (same time as Colonoscopy). You will have labs checked today in the basement lab.  Please head down after you check out with the front desk  (cbc).    Patients prepping instructions changed to reflect that she will hold her metformin the AM of the procedure not the entire day.

## 2013-03-08 ENCOUNTER — Telehealth: Payer: Self-pay | Admitting: Neurology

## 2013-03-08 MED ORDER — GABAPENTIN 100 MG PO CAPS: 100.0000 mg | ORAL_CAPSULE | Freq: Two times a day (BID) | ORAL | Status: DC

## 2013-03-08 NOTE — Telephone Encounter (Signed)
I spoke to patient and her first complaint is that she has had vomiting and diarrhea for 2 weeks, also dizziness and has fallen a few times.   She thought the dizziness was from the Gabapentin so she stopped taking medicine.  I told her she could be dehydrated which is making her dizzy.  Her number is [phone]

## 2013-03-08 NOTE — Telephone Encounter (Signed)
I called patient. The patient indicates that the 300 mg gabapentin twice daily calls too much gait instability. I will reduce the dose to 100 mg twice daily.

## 2013-03-10 ENCOUNTER — Encounter: Payer: Self-pay | Admitting: Neurology

## 2013-03-10 DIAGNOSIS — M545 Low back pain, unspecified: Secondary | ICD-10-CM

## 2013-03-10 DIAGNOSIS — D518 Other vitamin B12 deficiency anemias: Secondary | ICD-10-CM

## 2013-03-10 DIAGNOSIS — G63 Polyneuropathy in diseases classified elsewhere: Secondary | ICD-10-CM

## 2013-03-10 DIAGNOSIS — R6889 Other general symptoms and signs: Secondary | ICD-10-CM

## 2013-03-10 DIAGNOSIS — R269 Unspecified abnormalities of gait and mobility: Secondary | ICD-10-CM | POA: Insufficient documentation

## 2013-03-11 ENCOUNTER — Ambulatory Visit (INDEPENDENT_AMBULATORY_CARE_PROVIDER_SITE_OTHER): Payer: Medicare Other | Admitting: Neurology

## 2013-03-11 ENCOUNTER — Encounter: Payer: Self-pay | Admitting: Neurology

## 2013-03-11 VITALS — BP 115/75 | HR 91 | Wt 192.0 lb

## 2013-03-11 DIAGNOSIS — M545 Low back pain, unspecified: Secondary | ICD-10-CM

## 2013-03-11 DIAGNOSIS — G63 Polyneuropathy in diseases classified elsewhere: Secondary | ICD-10-CM
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
## 2013-04-04 ENCOUNTER — Telehealth: Payer: Self-pay | Admitting: Gastroenterology

## 2013-04-04 NOTE — Telephone Encounter (Signed)
Pt wanted clarification on her medications, we went over her instructions and she verbalized understanding

## 2013-04-08 ENCOUNTER — Ambulatory Visit (AMBULATORY_SURGERY_CENTER): Payer: Medicare Other | Admitting: Gastroenterology

## 2013-04-08 ENCOUNTER — Encounter: Payer: Medicare Other | Admitting: Gastroenterology

## 2013-04-08 ENCOUNTER — Encounter: Payer: Self-pay | Admitting: Gastroenterology

## 2013-04-08 VITALS — BP 163/78 | HR 75 | Temp 96.1°F | Resp 16 | Ht 64.0 in | Wt 194.0 lb

## 2013-04-08 DIAGNOSIS — K2970 Gastritis, unspecified, without bleeding: Secondary | ICD-10-CM

## 2013-04-08 DIAGNOSIS — D126 Benign neoplasm of colon, unspecified: Secondary | ICD-10-CM

## 2013-04-08 DIAGNOSIS — Z86010 Personal history of colonic polyps: Secondary | ICD-10-CM

## 2013-04-08 DIAGNOSIS — K2990 Gastroduodenitis, unspecified, without bleeding: Secondary | ICD-10-CM

## 2013-04-08 DIAGNOSIS — R195 Other fecal abnormalities: Secondary | ICD-10-CM

## 2013-04-08 LAB — GLUCOSE, CAPILLARY
Glucose-Capillary: 171 mg/dL — ABNORMAL HIGH (ref 70–99)
Glucose-Capillary: 132 mg/dL — ABNORMAL HIGH (ref 70–99)

## 2013-04-08 MED ORDER — SODIUM CHLORIDE 0.9 % IV SOLN: 500.0000 mL | INTRAVENOUS | Status: DC

## 2013-04-08 NOTE — Progress Notes (Signed)
Report to pacu rn, vss, bbs=clear 

## 2013-04-08 NOTE — Op Note (Signed)
[place]  [address]. [place], [address]   COLONOSCOPY PROCEDURE REPORT PATIENT: Ashley [name], Ashley [name]  MR#: [id number] BIRTHDATE: 1945-11-25 , 66  yrs. old GENDER: Female ENDOSCOPIST: [name], MD REFERRED BY: PROCEDURE DATE:  04/08/2013 PROCEDURE:   Colonoscopy with snare polypectomy First Screening Colonoscopy - Avg.  risk and is 50 yrs.  old or older - No.  Prior Negative Screening - Now for repeat screening. N/A  History of Adenoma - Now for follow-up colonoscopy & has been > or = to 3 yrs.  Yes hx of adenoma.  Has been 3 or more years since last colonoscopy.  Polyps Removed Today? Yes. ASA CLASS:   Class II INDICATIONS:adenomatous polyps.  Colonoscopy November 2004, Dr. [facility], indications, surveillance of adenomatous polyps, findings diverticulosis.  Recommended five-year recall colonoscopy. Colonoscopy Dr.  Ardis Hughs November 2009 found to 7 mm polyp, this was adenomatous on pathology, left-sided diverticulosis as well.  She was recommended for repeat colonoscopy again at 5 year interval. Dark stools recently. MEDICATIONS: MAC sedation, administered by CRNA and propofol (Diprivan) 200mg  IV DESCRIPTION OF PROCEDURE:   After the risks benefits and alternatives of the procedure were thoroughly explained, informed consent was obtained.  A digital rectal exam revealed no abnormalities of the rectum.   The LB [id number]  endoscope was introduced through the anus and advanced to the cecum, which was identified by both the appendix and ileocecal valve. No adverse events experienced.   The quality of the prep was good, using MoviPrep  The instrument was then slowly withdrawn as the colon was fully examined.  COLON FINDINGS: Three polyps were found, removed and sent to pathology.  These were all sessile, ranged in size from 3-17mm across, located in cecum, transverse, rectal segments, all were removed with cold snare.  There were a few diverticulum in  left colon.  The examination was otherwise normal.  Retroflexed views revealed no abnormalities. The time to cecum=4 minutes 14 seconds. Withdrawal time=13 minutes 02 seconds.  The scope was withdrawn and the procedure completed. COMPLICATIONS: There were no complications.  ENDOSCOPIC IMPRESSION: Three polyps were found, removed and sent to pathology. There were a few diverticulum in left colon. The examination was otherwise normal.  RECOMMENDATIONS: If the polyp(s) removed today are proven to be adenomatous (pre-cancerous) polyps, you will need a colonoscopy in 3-5 years. Please call my office if you have not received a letter after 3 weeks.   eSigned:  [name], MD 04/08/2013 10:02 AM

## 2013-04-08 NOTE — Progress Notes (Signed)
Called to room to assist during endoscopic procedure.  Patient ID and intended procedure confirmed with present staff. Received instructions for my participation in the procedure from the performing physician.  

## 2013-04-08 NOTE — Op Note (Signed)
[place]  [address]. [place], [address]   ENDOSCOPY PROCEDURE REPORT  PATIENT: Ashley [name], Ashley [name]  MR#: [id number] BIRTHDATE: Apr 04, 1946 , 66  yrs. old GENDER: Female ENDOSCOPIST: [name], MD REFERRED BY: PROCEDURE DATE:  04/08/2013 PROCEDURE:  EGD w/ biopsy ASA CLASS:     Class III INDICATIONS:  dark stools recently. MEDICATIONS: There was residual sedation effect present from prior procedure, MAC sedation, administered by CRNA, and Propofol (Diprivan) 75 mg IV TOPICAL ANESTHETIC: Cetacaine Spray  DESCRIPTION OF PROCEDURE: After the risks benefits and alternatives of the procedure were thoroughly explained, informed consent was obtained.  The LB [id number] endoscope was introduced through the mouth and advanced to the second portion of the duodenum. Without limitations.  The instrument was slowly withdrawn as the mucosa was fully examined.    There was mild, non-specific pan-gastritis.  Biopsies taken from the distal stomach and sent to pathology.  The examination was othewise normal.  Retroflexed views revealed no abnormalities.     The scope was then withdrawn from the patient and the procedure completed. COMPLICATIONS: There were no complications.  ENDOSCOPIC IMPRESSION: There was mild, non-specific pan-gastritis.  Biopsies taken from the distal stomach and sent to pathology.  The examination was othewise normal.  RECOMMENDATIONS: Await final pathology    eSigned:  [name], MD 04/08/2013 10:05 AM

## 2013-04-08 NOTE — Patient Instructions (Addendum)
YOU HAD AN ENDOSCOPIC PROCEDURE TODAY AT THE [facility]: Refer to the procedure report that was given to you for any specific questions about what was found during the examination.  If the procedure report does not answer your questions, please call your gastroenterologist to clarify.  If you requested that your care partner not be given the details of your procedure findings, then the procedure report has been included in a sealed envelope for you to review at your convenience later.  YOU SHOULD EXPECT: Some feelings of bloating in the abdomen. Passage of more gas than usual.  Walking can help get rid of the air that was put into your GI tract during the procedure and reduce the bloating. If you had a lower endoscopy (such as a colonoscopy or flexible sigmoidoscopy) you may notice spotting of blood in your stool or on the toilet paper. If you underwent a bowel prep for your procedure, then you may not have a normal bowel movement for a few days.  DIET: Your first meal following the procedure should be a light meal and then it is ok to progress to your normal diet.  A half-sandwich or bowl of soup is an example of a good first meal.  Heavy or fried foods are harder to digest and may make you feel nauseous or bloated.  Likewise meals heavy in dairy and vegetables can cause extra gas to form and this can also increase the bloating.  Drink plenty of fluids but you should avoid alcoholic beverages for 24 hours.  ACTIVITY: Your care partner should take you home directly after the procedure.  You should plan to take it easy, moving slowly for the rest of the day.  You can resume normal activity the day after the procedure however you should NOT DRIVE or use heavy machinery for 24 hours (because of the sedation medicines used during the test).    SYMPTOMS TO REPORT IMMEDIATELY: A gastroenterologist can be reached at any hour.  During normal business hours, 8:30 AM to 5:00 PM Monday through Friday,  call [phone].  After hours and on weekends, please call the GI answering service at [phone] who will take a message and have the physician on call contact you.   Following lower endoscopy (colonoscopy or flexible sigmoidoscopy):  Excessive amounts of blood in the stool  Significant tenderness or worsening of abdominal pains  Swelling of the abdomen that is new, acute  Fever of 100F or higher  Following upper endoscopy (EGD)  Vomiting of blood or coffee ground material  New chest pain or pain under the shoulder blades  Painful or persistently difficult swallowing  New shortness of breath  Fever of 100F or higher  Black, tarry-looking stools  FOLLOW UP: If any biopsies were taken you will be contacted by phone or by letter within the next 1-3 weeks.  Call your gastroenterologist if you have not heard about the biopsies in 3 weeks.  Our staff will call the home number listed on your records the next business day following your procedure to check on you and address any questions or concerns that you may have at that time regarding the information given to you following your procedure. This is a courtesy call and so if there is no answer at the home number and we have not heard from you through the emergency physician on call, we will assume that you have returned to your regular daily activities without incident.  SIGNATURES/CONFIDENTIALITY: You and/or your care   partner have signed paperwork which will be entered into your electronic medical record.  These signatures attest to the fact that that the information above on your After Visit Summary has been reviewed and is understood.  Full responsibility of the confidentiality of this discharge information lies with you and/or your care-partner.   You will need another colonoscopy within 3-5 years.

## 2013-04-08 NOTE — Progress Notes (Signed)
Patient did not have preoperative order for IV antibiotic SSI prophylaxis. (G8918)  Patient did not experience any of the following events: a burn prior to discharge; a fall within the facility; wrong site/side/patient/procedure/implant event; or a hospital transfer or hospital admission upon discharge from the facility. (G8907)  

## 2013-04-11 ENCOUNTER — Telehealth: Payer: Self-pay | Admitting: *Deleted

## 2013-04-11 NOTE — Telephone Encounter (Signed)
  Follow up Call-  Call back number 04/08/2013  Post procedure Call Back phone  # [phone]  Permission to leave phone message Yes     Patient questions:  Do you have a fever, pain , or abdominal swelling? no Pain Score  0 *  Have you tolerated food without any problems? yes  Have you been able to return to your normal activities? yes  Do you have any questions about your discharge instructions: Diet   no Medications  no Follow up visit  no  Do you have questions or concerns about your Care? no  Actions: * If pain score is 4 or above: No action needed, pain <4.

## 2013-04-18 LAB — HM COLONOSCOPY

## 2013-04-19 ENCOUNTER — Encounter: Payer: Self-pay | Admitting: Gastroenterology

## 2013-04-22 ENCOUNTER — Telehealth: Payer: Self-pay | Admitting: Gastroenterology

## 2013-04-22 DIAGNOSIS — R195 Other fecal abnormalities: Secondary | ICD-10-CM

## 2013-04-22 NOTE — Telephone Encounter (Signed)
Pt continues to have dark stools, no worse but no better.  She would like to know what it causing it and what she should do about it.  Endo Colon 03/2013 At least one of the polyps that I removed during your recent procedure was proven to be adenomatous.  These are pre-cancerous polyps that may have grown into cancers if they had not been removed.  Based on current nationally recognized surveillance guidelines, I recommend that you have a repeat colonoscopy in 5 years. The biopsies of your stomach during the EGD portion of your examination showed no sign of infection or cancer.  Please advise

## 2013-04-25 NOTE — Telephone Encounter (Signed)
She was not anemic in June.  Would like repeat CBC this week and 1 set of hemoccult cards to check if the dark color is from blood.

## 2013-04-25 NOTE — Telephone Encounter (Signed)
Pt aware and will have labs this week and pick up stool cards

## 2013-04-29 ENCOUNTER — Telehealth: Payer: Self-pay | Admitting: Neurology

## 2013-04-29 NOTE — Telephone Encounter (Signed)
I called the patient. The patient indicates that she is having some mild dizziness during the day on the gabapentin. The patient takes gabapentin 100 mg twice during the day, 300 mg at night. If the discomfort in the back and from the neuropathy is not severe, she may cut back on the daytime dosing. The patient uses a walker for ambulation, and she has not fallen since last seen.

## 2013-05-02 ENCOUNTER — Telehealth: Payer: Self-pay

## 2013-05-02 NOTE — Telephone Encounter (Signed)
Message copied by Barron Alvine on Mon May 02, 2013  8:43 AM ------      Message from: Barron Alvine      Created: Fri Apr 29, 2013  8:34 AM                   ----- Message -----         From: Barron Alvine, CMA         Sent: 04/29/2013           To: Barron Alvine, CMA            Pt to pick up stool cards and have labs ------

## 2013-05-02 NOTE — Telephone Encounter (Signed)
Pt called and notified to have labs and pick up stool cards this week

## 2013-05-04 ENCOUNTER — Other Ambulatory Visit (INDEPENDENT_AMBULATORY_CARE_PROVIDER_SITE_OTHER): Payer: Medicare Other

## 2013-05-04 DIAGNOSIS — R195 Other fecal abnormalities: Secondary | ICD-10-CM

## 2013-05-04 LAB — CBC WITH DIFFERENTIAL/PLATELET
Basophils Absolute: 0 10*3/uL (ref 0.0–0.1)
Basophils Relative: 0.3 % (ref 0.0–3.0)
Eosinophils Absolute: 0.2 10*3/uL (ref 0.0–0.7)
Eosinophils Relative: 1.9 % (ref 0.0–5.0)
HCT: 40.7 % (ref 36.0–46.0)
Hemoglobin: 13.6 g/dL (ref 12.0–15.0)
Lymphocytes Relative: 21.7 % (ref 12.0–46.0)
Lymphs Abs: 1.8 10*3/uL (ref 0.7–4.0)
MCHC: 33.4 g/dL (ref 30.0–36.0)
MCV: 91.5 fl (ref 78.0–100.0)
Monocytes Absolute: 0.4 10*3/uL (ref 0.1–1.0)
Monocytes Relative: 5.1 % (ref 3.0–12.0)
Neutro Abs: 5.7 10*3/uL (ref 1.4–7.7)
Neutrophils Relative %: 71 % (ref 43.0–77.0)
Platelets: 280 10*3/uL (ref 150.0–400.0)
RBC: 4.45 Mil/uL (ref 3.87–5.11)
RDW: 14.4 % (ref 11.5–14.6)
WBC: 8.1 10*3/uL (ref 4.5–10.5)

## 2013-05-08 ENCOUNTER — Other Ambulatory Visit: Payer: Self-pay | Admitting: Neurology

## 2013-05-27 ENCOUNTER — Other Ambulatory Visit (INDEPENDENT_AMBULATORY_CARE_PROVIDER_SITE_OTHER): Payer: Medicare Other

## 2013-05-27 DIAGNOSIS — R195 Other fecal abnormalities: Secondary | ICD-10-CM

## 2013-05-27 LAB — HEMOCCULT SLIDES (X 3 CARDS)
Fecal Occult Blood: NEGATIVE
OCCULT 1: NEGATIVE
OCCULT 2: NEGATIVE
OCCULT 3: NEGATIVE
OCCULT 4: NEGATIVE
OCCULT 5: NEGATIVE

## 2013-06-27 ENCOUNTER — Other Ambulatory Visit: Payer: Self-pay | Admitting: Emergency Medicine

## 2013-06-28 ENCOUNTER — Ambulatory Visit: Payer: Medicare Other | Admitting: Gynecologic Oncology

## 2013-07-05 ENCOUNTER — Ambulatory Visit: Payer: Medicare Other | Attending: Gynecologic Oncology | Admitting: Gynecologic Oncology

## 2013-07-05 ENCOUNTER — Encounter: Payer: Self-pay | Admitting: Gynecologic Oncology

## 2013-07-05 VITALS — BP 124/67 | HR 87 | Temp 98.0°F | Resp 16 | Ht 64.0 in | Wt 192.2 lb

## 2013-07-05 DIAGNOSIS — I10 Essential (primary) hypertension: Secondary | ICD-10-CM | POA: Insufficient documentation

## 2013-07-05 DIAGNOSIS — Z01419 Encounter for gynecological examination (general) (routine) without abnormal findings: Secondary | ICD-10-CM

## 2013-07-05 DIAGNOSIS — E119 Type 2 diabetes mellitus without complications: Secondary | ICD-10-CM | POA: Insufficient documentation

## 2013-07-05 DIAGNOSIS — B3789 Other sites of candidiasis: Secondary | ICD-10-CM

## 2013-07-05 DIAGNOSIS — B372 Candidiasis of skin and nail: Secondary | ICD-10-CM | POA: Insufficient documentation

## 2013-07-05 DIAGNOSIS — C519 Malignant neoplasm of vulva, unspecified: Secondary | ICD-10-CM | POA: Insufficient documentation

## 2013-07-05 DIAGNOSIS — F17200 Nicotine dependence, unspecified, uncomplicated: Secondary | ICD-10-CM | POA: Insufficient documentation

## 2013-07-05 DIAGNOSIS — Z90710 Acquired absence of both cervix and uterus: Secondary | ICD-10-CM | POA: Insufficient documentation

## 2013-07-05 MED ORDER — NYSTATIN 100000 UNIT/GM EX POWD: CUTANEOUS | Status: DC

## 2013-07-05 NOTE — Patient Instructions (Signed)
Cutaneous Candidiasis Cutaneous candidiasis is a condition in which there is an overgrowth of yeast (candida) on the skin. Yeast normally live on the skin, but in small enough numbers not to cause any symptoms. In certain cases, increased growth of the yeast may cause an actual yeast infection. This kind of infection usually occurs in areas of the skin that are constantly warm and moist, such as the armpits or the groin. Yeast is the most common cause of diaper rash in babies and in people who cannot control their bowel movements (incontinence). CAUSES  The fungus that most often causes cutaneous candidiasis is Candida albicans. Conditions that can increase the risk of getting a yeast infection of the skin include:  Obesity.  Pregnancy.  Diabetes.  Taking antibiotic medicine.  Taking birth control pills.  Taking steroid medicines.  Thyroid disease.  An iron or zinc deficiency.  Problems with the immune system. SYMPTOMS   Red, swollen area of the skin.  Bumps on the skin.  Itchiness. DIAGNOSIS  The diagnosis of cutaneous candidiasis is usually based on its appearance. Light scrapings of the skin may also be taken and viewed under a microscope to identify the presence of yeast. TREATMENT  Antifungal creams may be applied to the infected skin. In severe cases, oral medicines may be needed.  HOME CARE INSTRUCTIONS   Keep your skin clean and dry.  Maintain a healthy weight.  If you have diabetes, keep your blood sugar under control. SEEK IMMEDIATE MEDICAL CARE IF:  Your rash continues to spread despite treatment.  You have a fever, chills, or abdominal pain. Document Released: 04/22/2011 Document Revised: 10/27/2011 Document Reviewed: 04/22/2011 [employer] Patient Information 2014 [employer].  
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
## 2013-07-07 ENCOUNTER — Ambulatory Visit: Payer: Medicare Other | Admitting: Gynecologic Oncology

## 2013-07-08 ENCOUNTER — Other Ambulatory Visit: Payer: Self-pay | Admitting: Gynecologic Oncology

## 2013-07-08 DIAGNOSIS — Z1231 Encounter for screening mammogram for malignant neoplasm of breast: Secondary | ICD-10-CM

## 2013-07-20 ENCOUNTER — Encounter: Payer: Self-pay | Admitting: Physician Assistant

## 2013-07-20 ENCOUNTER — Ambulatory Visit (INDEPENDENT_AMBULATORY_CARE_PROVIDER_SITE_OTHER): Payer: Medicare Other | Admitting: Physician Assistant

## 2013-07-20 VITALS — BP 122/60 | HR 88 | Temp 98.1°F | Resp 16 | Ht 63.5 in | Wt 191.0 lb

## 2013-07-20 DIAGNOSIS — IMO0002 Reserved for concepts with insufficient information to code with codable children: Secondary | ICD-10-CM

## 2013-07-20 DIAGNOSIS — N8111 Cystocele, midline: Secondary | ICD-10-CM

## 2013-07-20 NOTE — Progress Notes (Signed)
   Subjective:    Patient ID: Ashley [name], female    DOB: 1946-06-17, 67 y.o.   MRN: [id number]  Vaginal Pain The patient's pertinent negatives include no genital itching, genital odor or vaginal discharge. This is a new problem. Episode onset: 3 days. The problem occurs constantly. The problem has been gradually worsening. The pain is mild. The problem affects the left side. She is not pregnant. Pertinent negatives include no abdominal pain, chills, dysuria, fever, frequency, hematuria or urgency. The vaginal discharge was normal. There has been no bleeding. The symptoms are aggravated by intercourse and tactile pressure. She has tried warm baths for the symptoms. The treatment provided mild relief. She is sexually active. No, her partner does not have an STD. She is postmenopausal. (Vulva cancer)   Review of Systems  Constitutional: Negative for fever and chills.  HENT: Negative.   Eyes: Negative.   Respiratory: Negative.   Cardiovascular: Negative.   Gastrointestinal: Negative for abdominal pain.  Genitourinary: Positive for vaginal pain. Negative for dysuria, urgency, frequency, hematuria, decreased urine volume, vaginal bleeding and vaginal discharge.      Objective:   Physical Exam  Constitutional: She appears well-developed and well-nourished.  Neck: Normal range of motion. Neck supple.  Pulmonary/Chest: Effort normal and breath sounds normal.  Abdominal: Soft. Bowel sounds are normal.  Genitourinary: Pelvic exam was performed with patient supine. There is erythema around the vagina. No vaginal discharge found.  + cystocele grade 1-2       Assessment & Plan:  Cystocele visible and with patients history of vulva cancer we will send her back to her OBGYN for possible treatment and further evaluation

## 2013-07-20 NOTE — Patient Instructions (Signed)
Anterior prolapse, also known as a cystocele (SIS-toe-seel), occurs when the supportive tissue between a woman's bladder and vaginal wall weakens and stretches, allowing the bladder to bulge into the vagina. Anterior prolapse is also called a prolapsed bladder. Straining the muscles that support your pelvic organs may lead to anterior prolapse. Such straining occurs during vaginal childbirth or with chronic constipation, violent coughing or heavy lifting. Anterior prolapse also tends to cause problems after menopause, when estrogen levels decrease. For a mild or moderate anterior prolapse, nonsurgical treatment is often effective. In more severe cases, surgery may be necessary to keep the vagina and other pelvic organs in their proper positions.

## 2013-07-21 ENCOUNTER — Ambulatory Visit: Payer: Self-pay | Admitting: Physician Assistant

## 2013-07-21 ENCOUNTER — Telehealth: Payer: Self-pay

## 2013-07-21 NOTE — Telephone Encounter (Signed)
She has a cystocele and needs to go to OB/GYN for treatment.

## 2013-07-21 NOTE — Telephone Encounter (Signed)
Pt called questioning what her visit was for yesterday. She couldn't remember why she needed to go to the OBGYN. She also said that something was said about being referred somewhere else, but didn't know where. Please advise

## 2013-07-22 ENCOUNTER — Telehealth: Payer: Self-pay | Admitting: *Deleted

## 2013-07-22 ENCOUNTER — Other Ambulatory Visit: Payer: Self-pay | Admitting: Internal Medicine

## 2013-07-22 MED ORDER — FENOFIBRATE MICRONIZED 134 MG PO CAPS: 134.0000 mg | ORAL_CAPSULE | Freq: Every day | ORAL | Status: DC

## 2013-07-22 NOTE — Telephone Encounter (Signed)
A new pt appointment was arranged  at [facility] urology for cystocele. Ashley [name] was informed of this appointment scheduled for Dec 29,2014 @ 3:15. She was asked to arrive early at 3:00 to register. Pt was also informed that [facility] would be sending her a information packet in the mail concerning her appt. Ashley [name] voiced understanding of her appt time and date.

## 2013-07-25 ENCOUNTER — Emergency Department (HOSPITAL_COMMUNITY)
Admission: EM | Admit: 2013-07-25 | Discharge: 2013-07-25 | Disposition: A | Discharge status: Home / Self Care | Payer: Medicare Other | Source: Home / Self Care | Attending: Family Medicine | Admitting: Family Medicine

## 2013-07-25 ENCOUNTER — Other Ambulatory Visit (HOSPITAL_COMMUNITY)
Admission: RE | Admit: 2013-07-25 | Discharge: 2013-07-25 | Disposition: A | Discharge status: Home / Self Care | Payer: Medicare Other | Source: Ambulatory Visit | Attending: Family Medicine | Admitting: Family Medicine

## 2013-07-25 ENCOUNTER — Encounter (HOSPITAL_COMMUNITY): Payer: Self-pay | Admitting: Emergency Medicine

## 2013-07-25 DIAGNOSIS — N8111 Cystocele, midline: Secondary | ICD-10-CM

## 2013-07-25 DIAGNOSIS — IMO0002 Reserved for concepts with insufficient information to code with codable children: Secondary | ICD-10-CM

## 2013-07-25 DIAGNOSIS — N760 Acute vaginitis: Secondary | ICD-10-CM | POA: Insufficient documentation

## 2013-07-25 DIAGNOSIS — Z113 Encounter for screening for infections with a predominantly sexual mode of transmission: Secondary | ICD-10-CM | POA: Insufficient documentation

## 2013-07-25 LAB — POCT URINALYSIS DIP (DEVICE)
Bilirubin Urine: NEGATIVE
Glucose, UA: 500 mg/dL — AB
Hgb urine dipstick: NEGATIVE
Ketones, ur: NEGATIVE mg/dL
Nitrite: NEGATIVE
Protein, ur: NEGATIVE mg/dL
Specific Gravity, Urine: <=1.005 (ref 1.005–1.030)
Urobilinogen, UA: 0.2 mg/dL (ref 0.0–1.0)
pH: 5.5 (ref 5.0–8.0)

## 2013-07-25 MED ORDER — TRAMADOL HCL 50 MG PO TABS: 50.0000 mg | ORAL_TABLET | Freq: Four times a day (QID) | ORAL | Status: DC | PRN

## 2013-07-25 MED ORDER — CEPHALEXIN 500 MG PO CAPS: 500.0000 mg | ORAL_CAPSULE | Freq: Two times a day (BID) | ORAL | Status: DC

## 2013-07-25 MED ORDER — POLYETHYLENE GLYCOL 3350 17 GM/SCOOP PO POWD: ORAL | Status: DC

## 2013-07-25 MED ORDER — BISACODYL 10 MG RE SUPP: 10.0000 mg | RECTAL | Status: DC | PRN

## 2013-07-25 NOTE — ED Notes (Signed)
Call back number for lab issues verified; discussed bowel training, diet, evacuation plans

## 2013-07-25 NOTE — ED Notes (Signed)
Reports vaginal pain"8"; denies d/c; reported CA vagina
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
## 2013-07-26 ENCOUNTER — Telehealth (HOSPITAL_COMMUNITY): Payer: Self-pay | Admitting: Family Medicine

## 2013-07-26 MED ORDER — FLUCONAZOLE 150 MG PO TABS: 150.0000 mg | ORAL_TABLET | Freq: Once | ORAL | Status: DC

## 2013-07-26 NOTE — ED Provider Notes (Signed)
Medical screening examination/treatment/procedure(s) were performed by a resident physician or non-physician practitioner and as the supervising physician I was immediately available for consultation/collaboration.  Lynne Leader, MD    Gregor Hams, MD 07/26/13 [phone]

## 2013-07-26 NOTE — ED Notes (Signed)
Pt has + yeast.  Fluconazole called in.  RN to call pt.   Gregor Hams, MD 07/26/13 [phone]

## 2013-07-27 ENCOUNTER — Telehealth (HOSPITAL_COMMUNITY): Payer: Self-pay | Admitting: *Deleted

## 2013-07-27 LAB — URINE CULTURE
Colony Count: NO GROWTH
Culture: NO GROWTH

## 2013-07-27 NOTE — ED Notes (Signed)
Affirm: Candida pos., Gardnerella and Trich neg.,  Urine culture: No growth. I called pt. Pt. verified x 2 and given results. Pt. told she needs Diflucan for the yeast infection and where to pick up her Rx. Pt. very uncomfortable with her cystocele.  Discussed with Dr. Otis Dials. She said surgery is the only thing for that and she already moved her appt. up to 12/24. She can use the Miralax to have more frequent BM's.  She can stop the Keflex since the urine culture had no growth. This will help stop the yeast infection from recurring.  C/o back pain and asked for a referral. I told her she should call her PCP for a referral and any further problems before she sees the urologist. Roselyn Meier 07/27/2013

## 2013-07-28 ENCOUNTER — Telehealth: Payer: Self-pay

## 2013-07-28 NOTE — Telephone Encounter (Signed)
Return call to patient and advised her that she will need to contact [facility] with any scheduling issues

## 2013-08-02 ENCOUNTER — Other Ambulatory Visit: Payer: Self-pay | Admitting: Neurology

## 2013-08-02 NOTE — Telephone Encounter (Signed)
Phone note from 04/29/13 says: The patient takes gabapentin 100 mg twice during the day, 300 mg at night.

## 2013-08-08 ENCOUNTER — Other Ambulatory Visit: Payer: Self-pay | Admitting: Neurology

## 2013-08-21 ENCOUNTER — Encounter: Payer: Self-pay | Admitting: Internal Medicine

## 2013-08-23 ENCOUNTER — Ambulatory Visit
Admission: RE | Admit: 2013-08-23 | Discharge: 2013-08-23 | Disposition: A | Discharge status: Home / Self Care | Payer: Commercial Managed Care - HMO | Source: Ambulatory Visit | Attending: Gynecologic Oncology | Admitting: Gynecologic Oncology

## 2013-08-23 DIAGNOSIS — Z1231 Encounter for screening mammogram for malignant neoplasm of breast: Secondary | ICD-10-CM

## 2013-08-25 ENCOUNTER — Ambulatory Visit (INDEPENDENT_AMBULATORY_CARE_PROVIDER_SITE_OTHER): Payer: Medicare HMO | Admitting: Emergency Medicine

## 2013-08-25 ENCOUNTER — Encounter: Payer: Self-pay | Admitting: Emergency Medicine

## 2013-08-25 VITALS — BP 100/62 | HR 96 | Temp 98.0°F | Resp 18 | Ht 63.5 in | Wt 187.0 lb

## 2013-08-25 DIAGNOSIS — E559 Vitamin D deficiency, unspecified: Secondary | ICD-10-CM

## 2013-08-25 DIAGNOSIS — IMO0001 Reserved for inherently not codable concepts without codable children: Secondary | ICD-10-CM

## 2013-08-25 DIAGNOSIS — E119 Type 2 diabetes mellitus without complications: Secondary | ICD-10-CM

## 2013-08-25 DIAGNOSIS — Z794 Long term (current) use of insulin: Secondary | ICD-10-CM

## 2013-08-25 DIAGNOSIS — E782 Mixed hyperlipidemia: Secondary | ICD-10-CM

## 2013-08-25 DIAGNOSIS — I10 Essential (primary) hypertension: Secondary | ICD-10-CM

## 2013-08-25 LAB — CBC WITH DIFFERENTIAL/PLATELET
Basophils Absolute: 0 10*3/uL (ref 0.0–0.1)
Basophils Relative: 1 % (ref 0–1)
Eosinophils Absolute: 0.2 10*3/uL (ref 0.0–0.7)
Eosinophils Relative: 2 % (ref 0–5)
HCT: 41.8 % (ref 36.0–46.0)
Hemoglobin: 13.9 g/dL (ref 12.0–15.0)
Lymphocytes Relative: 29 % (ref 12–46)
Lymphs Abs: 2.4 10*3/uL (ref 0.7–4.0)
MCH: 30.3 pg (ref 26.0–34.0)
MCHC: 33.3 g/dL (ref 30.0–36.0)
MCV: 91.1 fL (ref 78.0–100.0)
Monocytes Absolute: 0.5 10*3/uL (ref 0.1–1.0)
Monocytes Relative: 6 % (ref 3–12)
Neutro Abs: 5 10*3/uL (ref 1.7–7.7)
Neutrophils Relative %: 62 % (ref 43–77)
Platelets: 323 10*3/uL (ref 150–400)
RBC: 4.59 MIL/uL (ref 3.87–5.11)
RDW: 14 % (ref 11.5–15.5)
WBC: 8.1 10*3/uL (ref 4.0–10.5)

## 2013-08-25 LAB — LIPID PANEL
Cholesterol: 157 mg/dL (ref 0–200)
HDL: 35 mg/dL — ABNORMAL LOW (ref >39)
Total CHOL/HDL Ratio: 4.5 Ratio
Triglycerides: 432 mg/dL — ABNORMAL HIGH (ref <150)

## 2013-08-25 LAB — HEPATIC FUNCTION PANEL
ALT: 24 U/L (ref 0–35)
AST: 18 U/L (ref 0–37)
Albumin: 4.1 g/dL (ref 3.5–5.2)
Alkaline Phosphatase: 53 U/L (ref 39–117)
Bilirubin, Direct: 0.1 mg/dL (ref 0.0–0.3)
Indirect Bilirubin: 0.4 mg/dL (ref 0.0–0.9)
Total Bilirubin: 0.5 mg/dL (ref 0.3–1.2)
Total Protein: 6.7 g/dL (ref 6.0–8.3)

## 2013-08-25 LAB — BASIC METABOLIC PANEL WITH GFR
BUN: 27 mg/dL — ABNORMAL HIGH (ref 6–23)
CO2: 28 mEq/L (ref 19–32)
Calcium: 9.9 mg/dL (ref 8.4–10.5)
Chloride: 93 mEq/L — ABNORMAL LOW (ref 96–112)
Creat: 1.35 mg/dL — ABNORMAL HIGH (ref 0.50–1.10)
GFR, Est African American: 47 mL/min — ABNORMAL LOW
GFR, Est Non African American: 41 mL/min — ABNORMAL LOW
Glucose, Bld: 355 mg/dL — ABNORMAL HIGH (ref 70–99)
Potassium: 4.2 mEq/L (ref 3.5–5.3)
Sodium: 130 mEq/L — ABNORMAL LOW (ref 135–145)

## 2013-08-25 LAB — HEMOGLOBIN A1C
Hgb A1c MFr Bld: 10.8 % — ABNORMAL HIGH (ref <5.7)
Mean Plasma Glucose: 263 mg/dL — ABNORMAL HIGH (ref <117)

## 2013-08-25 MED ORDER — FLUCONAZOLE 150 MG PO TABS: 150.0000 mg | ORAL_TABLET | Freq: Every day | ORAL | Status: DC

## 2013-08-25 NOTE — Progress Notes (Signed)
Subjective:    Patient ID: Ashley [name], female    DOB: 1946-07-27, 68 y.o.   MRN: [id number]  HPI Comments: 68 yo noncompliant female presents for 3 month F/U for HTN, Cholesterol, DM, D. Deficient. She does not exercise. She does not eat healthy. She continues to drink several sodas a day despite multiple conversations about sugar content. LAST LABS T 154 TG 472 H 34 A1C 12.1 D 30  She has multiple complaints about different providers in regards to her vaginal/ bladder questionable issues. She notes she has seen urologist and has not scheduled pelvic floor rehab [facility]. She has not f/u at [facility].  Hypertension  Hyperlipidemia  Gastrophageal Reflux   Current Outpatient Prescriptions on File Prior to Visit  Medication Sig Dispense Refill  . allopurinol (ZYLOPRIM) 300 MG tablet Take 300 mg by mouth every morning.      Marland Kitchen atorvastatin (LIPITOR) 40 MG tablet Take 40 mg by mouth daily.      Marland Kitchen buPROPion (WELLBUTRIN) 100 MG tablet Take 100 mg by mouth daily.      . clonazePAM (KLONOPIN) 2 MG tablet Take 2 mg by mouth 2 (two) times daily. 1 in the am, 2 in the pm      . Dapagliflozin Propanediol (FARXIGA) 10 MG TABS Take 1 capsule by mouth daily.      . DiphenhydrAMINE HCl, Sleep, ([facility] SLEEP AID NIGHTTIME PO) Take 2 tablets by mouth at bedtime.      . enalapril (VASOTEC) 20 MG tablet TAKE ONE TABLET BY MOUTH EVERY DAY FOR BLOOD PRESSURE  90 tablet  1  . fenofibrate micronized (LOFIBRA) 134 MG capsule Take 1 capsule (134 mg total) by mouth daily.  30 capsule  3  . fluconazole (DIFLUCAN) 150 MG tablet Take 1 tablet (150 mg total) by mouth once.  1 tablet  1  . FLUoxetine (PROZAC) 40 MG capsule Take 20 mg by mouth daily.       Marland Kitchen gabapentin (NEURONTIN) 100 MG capsule TAKE ONE CAPSULE BY MOUTH TWICE A DAY  60 capsule  1  . gabapentin (NEURONTIN) 300 MG capsule Take 1 capsule (300 mg total) by mouth every evening.  30 capsule  3  . hydrochlorothiazide (HYDRODIURIL) 25 MG tablet Take 25 mg by mouth every  other day.       . levothyroxine (SYNTHROID, LEVOTHROID) 100 MCG tablet TAKE 1 TABLET BY MOUTH EVERY DAY  90 tablet  2  . metFORMIN (GLUCOPHAGE) 1000 MG tablet Take 500-1,000 mg by mouth 2 (two) times daily with a meal. 500MG  IN AND AM AND 1000MG  PM      . NOVOLIN N 100 UNIT/ML injection       . nystatin (MYCOSTATIN/NYSTOP) 100000 UNIT/GM POWD One application of powder twice daily as needed  60 g  3  . omeprazole (PRILOSEC) 20 MG capsule Take 20 mg by mouth every morning.      Marland Kitchen albuterol (PROVENTIL HFA;VENTOLIN HFA) 108 (90 BASE) MCG/ACT inhaler Inhale 1-2 puffs into the lungs every 6 (six) hours as needed for wheezing. Dispense with aerochamber  1 Inhaler  0  . fluticasone (FLONASE) 50 MCG/ACT nasal spray Place 2 sprays into the nose as needed.       No current facility-administered medications on file prior to visit.   ALLERGIES Sulfa antibiotics and Sulfacetamide sodium  Past Medical History  Diagnosis Date  . Diabetes mellitus   . Asthma   . Hypertension   . Hypercholesteremia   . Gout LEFT FOOT-  STABLE  . Neuropathy of lower extremity   . Bipolar 1 disorder   . OCD (obsessive compulsive disorder)   . Depression   . SOB (shortness of breath) on exertion   . GERD (gastroesophageal reflux disease)   . H/O hiatal hernia   . SUI (stress urinary incontinence, female)   . Arthritis LOWER BACK  . History of CVA (cerebrovascular accident) FOUND PER MRI 1994--  RESIDUAL MEMORY IMPAIRED  . Vulvar lesion   . Anxiety disorder   . Carpal tunnel syndrome   . Peripheral neuropathy   . Dyslipidemia   . Gait disorder       Review of Systems  Genitourinary:       "Pelvis fullness"  All other systems reviewed and are negative.   BP 100/62  Pulse 96  Temp(Src) 98 F (36.7 C) (Temporal)  Resp 18  Ht 5' 3.5" (1.613 m)  Wt 187 lb (84.823 kg)  BMI 32.60 kg/m2     Objective:   Physical Exam  Nursing note and vitals reviewed. Constitutional: She is oriented to person, place,  and time. She appears well-developed and well-nourished. No distress.  Overweight  HENT:  Head: Normocephalic and atraumatic.  Right Ear: External ear normal.  Left Ear: External ear normal.  Nose: Nose normal.  Mouth/Throat: Oropharynx is clear and moist.  Eyes: Conjunctivae and EOM are normal.  Neck: Normal range of motion. Neck supple. No JVD present. No thyromegaly present.  Cardiovascular: Normal rate, regular rhythm, normal heart sounds and intact distal pulses.   Pulmonary/Chest: Effort normal and breath sounds normal.  Abdominal: Soft. Bowel sounds are normal. She exhibits no distension and no mass. There is no tenderness. There is no rebound and no guarding.  Musculoskeletal: Normal range of motion. She exhibits no edema and no tenderness.  Lymphadenopathy:    She has no cervical adenopathy.  Neurological: She is alert and oriented to person, place, and time. No cranial nerve deficit.  Skin: Skin is warm and dry. No rash noted. No erythema. No pallor.  Psychiatric: Her behavior is normal. Thought content normal.  She is argumentative and does not follow directions.          Assessment & Plan  1.  3 month F/U for HTN, Cholesterol,DM, D. Deficient. Needs healthy diet, cardio QD and obtain healthy weight. Check Labs, Check BP if >130/80 call office, Check BS if >200 call office. ADVISED If she does not improve diet, D/C SODA we will need to add more medicines she is at increased for heart attack, stroke, cancer, loss of limbs and endless complications.  2. Vaginal/ bladder ? Prolapse- Advised Needs to call surgeon whom did female surgery to re-eval bowel/ bladder concerns or f/u at urologists with pelvic floor rehab.  3. Constipation- Advised decrease caffeine, increase H2o, Miralax QD until soft BM.   4. Probable Borderline personality disorder- needs Psyche evaluation

## 2013-08-25 NOTE — Patient Instructions (Signed)
Constipation, Adult Constipation is when a person:  Poops (bowel movement) less than 3 times a week.  Has a hard time pooping.  Has poop that is dry, hard, or bigger than normal. HOME CARE   Eat more fiber, such as fruits, vegetables, whole grains like brown rice, and beans.  Eat less fatty foods and sugar. This includes [place] fries, hamburgers, cookies, candy, and soda.  If you are not getting enough fiber from food, take products with added fiber in them (supplements).  Drink enough fluid to keep your pee (urine) clear or pale yellow.  Go to the restroom when you feel like you need to poop. Do not hold it.  Only take medicine as told by your doctor. Do not take medicines that help you poop (laxatives) without talking to your doctor first.  Exercise on a regular basis, or as told by your doctor. GET HELP RIGHT AWAY IF:   You have bright red blood in your poop (stool).  Your constipation lasts more than 4 days or gets worse.  You have belly (abdomen) or butt (rectal) pain.  You have thin poop (as thin as a pencil).  You lose weight, and it cannot be explained. MAKE SURE YOU:   Understand these instructions.  Will watch your condition.  Will get help right away if you are not doing well or get worse. Document Released: 01/21/2008 Document Revised: 10/27/2011 Document Reviewed: 07/08/2011 [facility] Patient Information 2014 [place], [place]. Dialysis Dialysis is a procedure that replaces some of the work healthy kidneys do. It is done when you lose about 85 90% of your kidney function. It may also be done earlier if your symptoms may be improved by dialysis. During dialysis, wastes, salt, and extra water are removed from the blood, and the levels of certain chemicals in the blood (such as potassium) are maintained. Dialysis is done in sessions. Dialysis sessions are continued until the kidneys get better. If the kidneys cannot get better, such as in end-stage kidney disease,  dialysis is continued for life or until you receive a new kidney (kidney transplant). There are two types of dialysis: hemodialysis and peritoneal dialysis. HEMODIALYSIS  Hemodialysis is a type of dialysis in which a machine called a dialyzer is used to filter the blood. Before beginning hemodialysis, you will have surgery to create a site where blood can be removed from the body and returned to the body (vascular access). There are three types of vascular accesses:  Arteriovenous fistula. To create this type of access, an artery is connected to a vein (usually in the arm). A fistula takes 1 6 months to develop after surgery. If it develops properly, it usually lasts longer than the other types of vascular accesses. It is also less likely to become infected and cause blood clots.  Arteriovenous graft. To create this type of access, an artery and a vein in the arm are connected with a tube. A graft may be used within 2 3 weeks of surgery.  A venous catheter. To create this type of access, a thin, flexible tube (catheter) is placed in a large vein in your neck, chest, or groin. A catheter may be used right away. It is usually used as a temporary access when dialysis needs to begin immediately. During hemodialysis, blood leaves the body through your access. It travels through a tube to the dialyzer, where it is filtered. The blood then returns to your body through another tube. Hemodialysis is usually performed by a caregiver at a  hospital or dialysis center three times a week. Visits last about 3 4 hours. It may also be performed with the help of another person at home with training.  PERITONEAL DIALYSIS Peritoneal dialysis is a type of dialysis in which the thin lining of the abdomen (peritoneum) is used as a filter. Before beginning peritoneal dialysis, you will have surgery to place a catheter in your abdomen. The catheter will be used to transfer a fluid called dialysate to and from your abdomen. At  the start of a session, your abdomen is filled with dialysate. During the session, wastes, salt, and extra water in the blood pass through the peritoneum and into the dialysate. The dialysate is drained from the body at the end of the session. The process of filling and draining the dialysate is called an exchange. Exchanges are repeated until you have used up all the dialysate for the day. Peritoneal dialysis may be performed by you at home or at almost any other location. It is done every day. You may need up to five exchanges a day. The amount of time the dialysate is in your body between exchanges is called a dwell. The dwell depends on the number of exchanges needed and the characteristics of the peritoneum. It usually varies from 1.5 3 hours. You may go about your day normally between exchanges. Alternately, the exchanges may be done at night while you sleep using a machine called a cycler. CHOOSING HEMODIALYSIS OR PERITONEAL DIALYSIS  Both hemodialysis and peritoneal dialysis have advantages and disadvantages. Talk to your caregiver about which type of dialysis would be best for you. Your lifestyle and preferences should be considered along with your medical condition. In some cases, only one type of dialysis may be an option.  Advantages of hemodialysis  It is done less often than peritoneal dialysis.  Someone else can do the dialysis for you.  If you go to a dialysis center, your caregiver will be able to recognize any problems right away.  If you go to a dialysis center, you can interact with others who are having dialysis. This can provide you with emotional support. Disadvantages of hemodialysis  Hemodialysis may cause cramps and low blood pressure. It may leave you feeling tired on the days you have the treatment.  If you go to a dialysis center, you will need to make weekly appointments and work around the center's schedule.  You will need to take extra care when traveling. If you go  to a dialysis center, you will need to make special arrangements to visit a dialysis center near your destination. If you are having treatments at home, you will need to take the dialyzer with you to your destination.  You will need to avoid more foods than you would need to avoid on peritoneal dialysis. Advantages of peritoneal dialysis  It is less likely than hemodialysis to cause cramps and low blood pressure.  You may do exchanges on your own wherever you are, including when you travel.  You do not need to avoid as many foods as you do on hemodialysis. Disadvantages of Peritoneal Dialysis  It is done more often than hemodialysis.  Performing peritoneal dialysis requires you to have dexterity of the hands. You must also be able to lift bags.  You will have to learn sterilization techniques. You will need to practice them every day to reduce the risk of infection. DIALYSIS DIET Both hemodialysis and peritoneal dialysis require you to make some changes to your diet. For  example, you will need to limit your intake of foods high in the minerals phosphorus and potassium. You will also need to limit your fluid intake. Your dietitian can help you plan meals. A good meal plan can improve your dialysis and your health.  WHAT TO EXPECT  Adjusting to the dialysis treatment, schedule, and diet can take some time. You may need to stop working and may not be able to do some of the things you normally do. You may feel anxious or depressed when beginning dialysis. Eventually, many people feel better overall because of dialysis. Some people are able to return to work after making some changes, such as reducing work intensity. FOR MORE INFORMATION  National Kidney Foundation: www.kidney.org American Association of Kidney Patients: BombTimer.gl  American Kidney Fund: www.kidneyfund.org Document Released: 10/25/2002 Document Revised: 04/06/2013 Document Reviewed: 09/28/2012 [facility] Patient Information  2014 [place].

## 2013-09-08 ENCOUNTER — Ambulatory Visit (INDEPENDENT_AMBULATORY_CARE_PROVIDER_SITE_OTHER): Payer: Medicare HMO | Admitting: Emergency Medicine

## 2013-09-08 ENCOUNTER — Encounter: Payer: Self-pay | Admitting: Emergency Medicine

## 2013-09-08 VITALS — BP 102/64 | HR 98 | Temp 98.2°F | Resp 16 | Ht 63.5 in | Wt 185.0 lb

## 2013-09-08 DIAGNOSIS — H6120 Impacted cerumen, unspecified ear: Secondary | ICD-10-CM

## 2013-09-08 DIAGNOSIS — R899 Unspecified abnormal finding in specimens from other organs, systems and tissues: Secondary | ICD-10-CM

## 2013-09-08 DIAGNOSIS — K5900 Constipation, unspecified: Secondary | ICD-10-CM

## 2013-09-08 NOTE — Patient Instructions (Signed)
Constipation, Adult  Constipation is when a person:  · Poops (bowel movement) less than 3 times a week.  · Has a hard time pooping.  · Has poop that is dry, hard, or bigger than normal.  HOME CARE   · Eat more fiber, such as fruits, vegetables, whole grains like brown rice, and beans.  · Eat less fatty foods and sugar. This includes French fries, hamburgers, cookies, candy, and soda.  · If you are not getting enough fiber from food, take products with added fiber in them (supplements).  · Drink enough fluid to keep your pee (urine) clear or pale yellow.  · Go to the restroom when you feel like you need to poop. Do not hold it.  · Only take medicine as told by your doctor. Do not take medicines that help you poop (laxatives) without talking to your doctor first.  · Exercise on a regular basis, or as told by your doctor.  GET HELP RIGHT AWAY IF:   · You have bright red blood in your poop (stool).  · Your constipation lasts more than 4 days or gets worse.  · You have belly (abdomen) or butt (rectal) pain.  · You have thin poop (as thin as a pencil).  · You lose weight, and it cannot be explained.  MAKE SURE YOU:   · Understand these instructions.  · Will watch your condition.  · Will get help right away if you are not doing well or get worse.  Document Released: 01/21/2008 Document Revised: 10/27/2011 Document Reviewed: 05/16/2013  [employer]® Patient Information ©2014 [employer].

## 2013-09-09 LAB — BASIC METABOLIC PANEL WITH GFR
BUN: 30 mg/dL — ABNORMAL HIGH (ref 6–23)
CO2: 26 mEq/L (ref 19–32)
Calcium: 9.9 mg/dL (ref 8.4–10.5)
Chloride: 96 mEq/L (ref 96–112)
Creat: 1.39 mg/dL — ABNORMAL HIGH (ref 0.50–1.10)
GFR, Est African American: 46 mL/min — ABNORMAL LOW
GFR, Est Non African American: 40 mL/min — ABNORMAL LOW
Glucose, Bld: 200 mg/dL — ABNORMAL HIGH (ref 70–99)
Potassium: 3.5 mEq/L (ref 3.5–5.3)
Sodium: 131 mEq/L — ABNORMAL LOW (ref 135–145)

## 2013-09-14 ENCOUNTER — Other Ambulatory Visit: Payer: Self-pay | Admitting: Emergency Medicine

## 2013-09-14 MED ORDER — GABAPENTIN 100 MG PO CAPS: ORAL_CAPSULE | ORAL | Status: DC

## 2013-09-14 MED ORDER — LEVOTHYROXINE SODIUM 100 MCG PO TABS: ORAL_TABLET | ORAL | Status: DC

## 2013-09-14 MED ORDER — ALLOPURINOL 300 MG PO TABS: 300.0000 mg | ORAL_TABLET | Freq: Every morning | ORAL | Status: DC

## 2013-09-14 MED ORDER — ATORVASTATIN CALCIUM 40 MG PO TABS: 40.0000 mg | ORAL_TABLET | Freq: Every day | ORAL | Status: DC

## 2013-09-14 MED ORDER — DAPAGLIFLOZIN PROPANEDIOL 10 MG PO TABS: 1.0000 | ORAL_TABLET | Freq: Every day | ORAL | Status: DC

## 2013-09-14 MED ORDER — FLUOXETINE HCL 40 MG PO CAPS: 40.0000 mg | ORAL_CAPSULE | Freq: Every day | ORAL | Status: DC

## 2013-09-14 MED ORDER — GABAPENTIN 300 MG PO CAPS: 300.0000 mg | ORAL_CAPSULE | Freq: Every evening | ORAL | Status: DC

## 2013-09-14 MED ORDER — METFORMIN HCL 1000 MG PO TABS: 500.0000 mg | ORAL_TABLET | Freq: Two times a day (BID) | ORAL | Status: DC

## 2013-09-14 MED ORDER — INSULIN NPH (HUMAN) (ISOPHANE) 100 UNIT/ML ~~LOC~~ SUSP: SUBCUTANEOUS | Status: DC

## 2013-09-14 MED ORDER — ENALAPRIL MALEATE 20 MG PO TABS: ORAL_TABLET | ORAL | Status: DC

## 2013-09-14 MED ORDER — BUPROPION HCL 100 MG PO TABS: 100.0000 mg | ORAL_TABLET | Freq: Every day | ORAL | Status: DC

## 2013-09-14 MED ORDER — FENOFIBRATE MICRONIZED 134 MG PO CAPS: 134.0000 mg | ORAL_CAPSULE | Freq: Every day | ORAL | Status: DC

## 2013-09-14 MED ORDER — HYDROCHLOROTHIAZIDE 25 MG PO TABS: 25.0000 mg | ORAL_TABLET | ORAL | Status: DC

## 2013-09-14 MED ORDER — OMEPRAZOLE 20 MG PO CPDR: 20.0000 mg | DELAYED_RELEASE_CAPSULE | Freq: Every morning | ORAL | Status: DC

## 2013-09-14 NOTE — Progress Notes (Signed)
Subjective:    Patient ID: Ashley [name], female    DOB: Nov 10, 1945, 68 y.o.   MRN: [id number]  HPI Comments: 68 yo noncompliant female needs repeat labs with abnormal bmet at last lab.  CREATININE     1.39   09/08/2013 BUN              30   09/08/2013 NA              131   09/08/2013 K               3.5   09/08/2013 CL               96   09/08/2013 CO2              26   09/08/2013 She has tried to decrease soda intake and increase H2o, and improve diet.   She notes she has tried to do debrox at home and still feels left ear is stopped up and would like it to be cleaned out.  She notes chronic constipation. She has tried several stool softeners without good success. She notes fleets enema has worked the best and does one almost every other day x 2 weeks to help with BM. She denies any GI hx of obstruction.      Medication List       This list is accurate as of: 09/08/13 11:59 PM.  Always use your most recent med list.               albuterol 108 (90 BASE) MCG/ACT inhaler  Commonly known as:  PROVENTIL HFA;VENTOLIN HFA  Inhale 1-2 puffs into the lungs every 6 (six) hours as needed for wheezing. Dispense with aerochamber     allopurinol 300 MG tablet  Commonly known as:  ZYLOPRIM  Take 300 mg by mouth every morning.     atorvastatin 40 MG tablet  Commonly known as:  LIPITOR  Take 40 mg by mouth daily.     Biotin 10 MG Caps  Take by mouth.     clonazePAM 2 MG tablet  Commonly known as:  KLONOPIN  Take 2 mg by mouth 2 (two) times daily. 1 in the am, 2 in the pm     [facility]S SLEEP AID NIGHTTIME PO  Take 2 tablets by mouth at bedtime.     enalapril 20 MG tablet  Commonly known as:  VASOTEC  TAKE ONE TABLET BY MOUTH EVERY DAY FOR BLOOD PRESSURE     FARXIGA 10 MG Tabs  Generic drug:  Dapagliflozin Propanediol  Take 1 capsule by mouth daily.     fenofibrate micronized 134 MG capsule  Commonly known as:  LOFIBRA  Take 1 capsule (134 mg total) by mouth daily.     fluconazole  150 MG tablet  Commonly known as:  DIFLUCAN  Take 1 tablet (150 mg total) by mouth once.     fluconazole 150 MG tablet  Commonly known as:  DIFLUCAN  Take 1 tablet (150 mg total) by mouth daily.     FLUoxetine 40 MG capsule  Commonly known as:  PROZAC  Take 20 mg by mouth daily.     fluticasone 50 MCG/ACT nasal spray  Commonly known as:  FLONASE  Place 2 sprays into the nose as needed.     gabapentin 300 MG capsule  Commonly known as:  NEURONTIN  Take 1 capsule (300 mg total) by mouth every evening.     gabapentin 100  MG capsule  Commonly known as:  NEURONTIN  TAKE ONE CAPSULE BY MOUTH TWICE A DAY     hydrochlorothiazide 25 MG tablet  Commonly known as:  HYDRODIURIL  Take 25 mg by mouth every other day.     levothyroxine 100 MCG tablet  Commonly known as:  SYNTHROID, LEVOTHROID  TAKE 1 TABLET BY MOUTH EVERY DAY     metFORMIN 1000 MG tablet  Commonly known as:  GLUCOPHAGE  Take 500-1,000 mg by mouth 2 (two) times daily with a meal. 500MG  IN AND AM AND 1000MG  PM     NOVOLIN N 100 UNIT/ML injection  Generic drug:  insulin NPH Human     nystatin 100000 UNIT/GM Powd  One application of powder twice daily as needed     omeprazole 20 MG capsule  Commonly known as:  PRILOSEC  Take 20 mg by mouth every morning.     WELLBUTRIN 100 MG tablet  Generic drug:  buPROPion  Take 100 mg by mouth daily.       ALLERGIES Sulfa antibiotics and Sulfacetamide sodium  Past Medical History  Diagnosis Date  . Diabetes mellitus   . Asthma   . Hypertension   . Hypercholesteremia   . Gout LEFT FOOT-  STABLE  . Neuropathy of lower extremity   . Bipolar 1 disorder   . OCD (obsessive compulsive disorder)   . Depression   . SOB (shortness of breath) on exertion   . GERD (gastroesophageal reflux disease)   . H/O hiatal hernia   . SUI (stress urinary incontinence, female)   . Arthritis LOWER BACK  . History of CVA (cerebrovascular accident) FOUND PER MRI 1994--  RESIDUAL MEMORY  IMPAIRED  . Vulvar lesion   . Anxiety disorder   . Carpal tunnel syndrome   . Peripheral neuropathy   . Dyslipidemia   . Gait disorder     Review of Systems  HENT: Positive for ear discharge.   Gastrointestinal: Positive for constipation.  All other systems reviewed and are negative.   BP 102/64  Pulse 98  Temp(Src) 98.2 F (36.8 C) (Temporal)  Resp 16  Ht 5' 3.5" (1.613 m)  Wt 185 lb (83.915 kg)  BMI 32.25 kg/m2     Objective:   Physical Exam  Nursing note and vitals reviewed. Constitutional: She is oriented to person, place, and time. She appears well-developed and well-nourished. No distress.  HENT:  Head: Normocephalic and atraumatic.  Right Ear: External ear normal.  Left Ear: External ear normal.  Nose: Nose normal.  Mouth/Throat: Oropharynx is clear and moist.  Left ear with moist wax impaction, resolved with ear lavage  Eyes: Conjunctivae and EOM are normal.  Neck: Normal range of motion. Neck supple. No JVD present. No thyromegaly present.  Cardiovascular: Normal rate, regular rhythm, normal heart sounds and intact distal pulses.   Pulmonary/Chest: Effort normal and breath sounds normal.  Abdominal: Soft. Bowel sounds are normal. She exhibits no distension and no mass. There is no tenderness. There is no rebound and no guarding.  Musculoskeletal: Normal range of motion. She exhibits no edema and no tenderness.  Lymphadenopathy:    She has no cervical adenopathy.  Neurological: She is alert and oriented to person, place, and time. No cranial nerve deficit.  Skin: Skin is warm and dry. No rash noted. No erythema. No pallor.  Psychiatric: She has a normal mood and affect. Judgment and thought content normal.          Assessment & Plan:  1.  Abnormal lab recheck 2. Constipation- diet/ exercise/ increase h2o explained. Add metamucil QD, liquid diet until soft BM, try not to use enema more than 1 x weekly if at all, if sx continue needs GI eval 3. Cerumen  impaction with good results s/p lavage, maintenance explained

## 2013-09-21 ENCOUNTER — Other Ambulatory Visit: Payer: Self-pay | Admitting: Emergency Medicine

## 2013-09-26 ENCOUNTER — Other Ambulatory Visit: Payer: Self-pay | Admitting: Emergency Medicine

## 2013-09-28 ENCOUNTER — Ambulatory Visit (INDEPENDENT_AMBULATORY_CARE_PROVIDER_SITE_OTHER): Payer: Medicare HMO | Admitting: Emergency Medicine

## 2013-09-28 ENCOUNTER — Encounter: Payer: Self-pay | Admitting: Emergency Medicine

## 2013-09-28 VITALS — BP 108/70 | HR 88 | Temp 98.0°F | Resp 16 | Ht 63.5 in | Wt 184.0 lb

## 2013-09-28 DIAGNOSIS — S51809A Unspecified open wound of unspecified forearm, initial encounter: Secondary | ICD-10-CM

## 2013-09-28 DIAGNOSIS — B370 Candidal stomatitis: Secondary | ICD-10-CM

## 2013-09-28 DIAGNOSIS — S51811A Laceration without foreign body of right forearm, initial encounter: Secondary | ICD-10-CM

## 2013-09-28 MED ORDER — FLUCONAZOLE 150 MG PO TABS: 150.0000 mg | ORAL_TABLET | Freq: Every day | ORAL | Status: DC

## 2013-09-28 NOTE — Progress Notes (Signed)
Subjective:    Patient ID: Ashley [name], female    DOB: Oct 21, 1945, 68 y.o.   MRN: [id number]  HPI Comments: 68 yo female with Right wrist cut on door frame and she has been cleaning and using neosporin and notes area is a little more swollen and red. SHe has been able to get the skin to unfold and denies any bleeding currently.  She denies thrush symptoms currently but would like Diflucan for future episodes. She notes she usually get thrush after performing oral sx on spouse. She notes spouse has HX uncontrolled DM and recurrent yeast infections. She has been advised numerous times in the past not to perform oral sex when he is having infection.   Current Outpatient Prescriptions on File Prior to Visit  Medication Sig Dispense Refill  . allopurinol (ZYLOPRIM) 300 MG tablet Take 1 tablet (300 mg total) by mouth every morning.  90 tablet  2  . atorvastatin (LIPITOR) 40 MG tablet Take 1 tablet (40 mg total) by mouth daily.  90 tablet  2  . Biotin 10 MG CAPS Take by mouth.      Marland Kitchen buPROPion (WELLBUTRIN) 100 MG tablet Take 1 tablet (100 mg total) by mouth daily.  90 tablet  1  . clonazePAM (KLONOPIN) 2 MG tablet Take 2 mg by mouth 2 (two) times daily. 1 in the am, 2 in the pm      . Dapagliflozin Propanediol (FARXIGA) 10 MG TABS Take 1 capsule by mouth daily.  90 tablet  2  . DiphenhydrAMINE HCl, Sleep, ([facility] SLEEP AID NIGHTTIME PO) Take 2 tablets by mouth at bedtime.      . enalapril (VASOTEC) 20 MG tablet TAKE ONE TABLET BY MOUTH EVERY DAY FOR BLOOD PRESSURE  90 tablet  1  . fenofibrate micronized (LOFIBRA) 134 MG capsule Take 1 capsule (134 mg total) by mouth daily.  90 capsule  1  . fluconazole (DIFLUCAN) 150 MG tablet Take 1 tablet (150 mg total) by mouth daily.  2 tablet  0  . FLUoxetine (PROZAC) 40 MG capsule Take 1 capsule (40 mg total) by mouth daily.  90 capsule  1  . gabapentin (NEURONTIN) 300 MG capsule Take 1 capsule (300 mg total) by mouth every evening.  90 capsule  1  .  hydrochlorothiazide (HYDRODIURIL) 25 MG tablet Take 1 tablet (25 mg total) by mouth every other day.  90 tablet  1  . insulin NPH Human (NOVOLIN N) 100 UNIT/ML injection Patient dose adjusts insulin to BS readings and diet. May use up to 30 units BID  10 mL  6  . levothyroxine (SYNTHROID, LEVOTHROID) 100 MCG tablet TAKE 1 TABLET BY MOUTH EVERY DAY  90 tablet  2  . metFORMIN (GLUCOPHAGE) 1000 MG tablet Take 0.5-1 tablets (500-1,000 mg total) by mouth 2 (two) times daily with a meal. 500MG  IN AND AM AND 1000MG  PM  180 tablet  1  . nystatin (MYCOSTATIN/NYSTOP) 100000 UNIT/GM POWD One application of powder twice daily as needed  60 g  3  . omeprazole (PRILOSEC) 20 MG capsule Take 1 capsule (20 mg total) by mouth every morning.  90 capsule  2  . albuterol (PROVENTIL HFA;VENTOLIN HFA) 108 (90 BASE) MCG/ACT inhaler Inhale 1-2 puffs into the lungs every 6 (six) hours as needed for wheezing. Dispense with aerochamber  1 Inhaler  0  . fluticasone (FLONASE) 50 MCG/ACT nasal spray Place 2 sprays into the nose as needed.       No current facility-administered  medications on file prior to visit.   Allergies  Allergen Reactions  . Sulfa Antibiotics Other (See Comments)    Pt states she had "extreme pain"  . Sulfacetamide Sodium    Past Medical History  Diagnosis Date  . Diabetes mellitus   . Asthma   . Hypertension   . Hypercholesteremia   . Gout LEFT FOOT-  STABLE  . Neuropathy of lower extremity   . Bipolar 1 disorder   . OCD (obsessive compulsive disorder)   . Depression   . SOB (shortness of breath) on exertion   . GERD (gastroesophageal reflux disease)   . H/O hiatal hernia   . SUI (stress urinary incontinence, female)   . Arthritis LOWER BACK  . History of CVA (cerebrovascular accident) FOUND PER MRI 1994--  RESIDUAL MEMORY IMPAIRED  . Vulvar lesion   . Anxiety disorder   . Carpal tunnel syndrome   . Peripheral neuropathy   . Dyslipidemia   . Gait disorder      Review of Systems   Skin: Positive for wound.  All other systems reviewed and are negative.   BP 108/70  Pulse 88  Temp(Src) 98 F (36.7 C) (Temporal)  Resp 16  Ht 5' 3.5" (1.613 m)  Wt 184 lb (83.462 kg)  BMI 32.08 kg/m2     Objective:   Physical Exam  Nursing note and vitals reviewed. Constitutional: She is oriented to person, place, and time. She appears well-developed and well-nourished.  HENT:  Head: Normocephalic and atraumatic.  Right Ear: External ear normal.  Left Ear: External ear normal.  Nose: Nose normal.  Mouth/Throat: Oropharynx is clear and moist. No oropharyngeal exudate.  Eyes: Conjunctivae and EOM are normal.  Neck: Normal range of motion.  Cardiovascular: Normal rate, regular rhythm, normal heart sounds and intact distal pulses.   Pulmonary/Chest: Effort normal and breath sounds normal.  Musculoskeletal: Normal range of motion.  Lymphadenopathy:    She has no cervical adenopathy.  Neurological: She is alert and oriented to person, place, and time.  Skin: Skin is warm and dry.  Mid- distal forearm with nickel size skin tear in shape of dog ear. Skin has been reapproximated. There are no obvious indications of infection. Minimal erythema at edges of wound.  Psychiatric: She has a normal mood and affect. Judgment normal.          Assessment & Plan:  1. Wrist Right skin tear- Area cleaned, skin approximated and tegaderm applied, wound instructions given, w/c if any sign of infection occurs.  2. HX OF ORAL THRUSH with recurrent exposure to husband with penile candidiasis- Advise PT NO ORAL SEX while husband has infection. Diflucan AD. CAN get RX of Oral Nystatin w/c if needs in future no current infection. Hygiene explained

## 2013-09-28 NOTE — Patient Instructions (Signed)
Thrush, Adult   Thrush is an infection that can happen on the mouth, throat, tongue, or other areas. It causes white patches to form on the mouth and tongue.  HOME CARE   Only take medicine as told by your doctor. You may be given medicine to swallow or to apply right on the area.   Eat plain yogurt that contains live cultures (check the label).   Rinse your mouth many times a day with a warm saltwater rinse. To make the rinse, mix 1 teaspoon (6 g) of salt in 8 ounces (0.2 L) of warm water.  To reduce pain:   Drink cold liquids such as water or iced tea.   Eat frozen ice pops or frozen juices.   Eat foods that are easy to swallow, such as gelatin or ice cream.   Drink from a straw if the patches are painful.  If you are breastfeeding:   Clean your nipples with an antifungal medicine.   Dry your nipples after breastfeeding.   Use an ointment called lanolin to help relieve nipple soreness.  If you wear dentures:   Take out your dentures before going to bed.   Brush them thoroughly.   Soak them in a denture cleaner.  GET HELP IF:    Your problems are getting worse.   Your problems are not improving within 7 days of starting treatment.   Your infection is spreading. This may show as white patches on the skin outside of your mouth.   You are nursing and have redness and pain in the nipples.  MAKE SURE YOU:   Understand these instructions.   Will watch your condition.   Will get help right away if you are not doing well or get worse.  Document Released: 10/29/2009 Document Revised: 05/25/2013 Document Reviewed: 03/07/2013  [employer] Patient Information 2014 [employer].

## 2013-11-04 ENCOUNTER — Encounter (INDEPENDENT_AMBULATORY_CARE_PROVIDER_SITE_OTHER): Payer: Self-pay

## 2013-11-04 ENCOUNTER — Encounter: Payer: Self-pay | Admitting: Internal Medicine

## 2013-11-04 ENCOUNTER — Ambulatory Visit (INDEPENDENT_AMBULATORY_CARE_PROVIDER_SITE_OTHER): Payer: Medicare HMO | Admitting: Internal Medicine

## 2013-11-04 VITALS — BP 128/74 | HR 80 | Temp 97.5°F | Resp 16 | Ht 63.5 in | Wt 185.4 lb

## 2013-11-04 DIAGNOSIS — I639 Cerebral infarction, unspecified: Secondary | ICD-10-CM

## 2013-11-04 DIAGNOSIS — R202 Paresthesia of skin: Secondary | ICD-10-CM

## 2013-11-04 DIAGNOSIS — R209 Unspecified disturbances of skin sensation: Secondary | ICD-10-CM

## 2013-11-04 DIAGNOSIS — K2100 Gastro-esophageal reflux disease with esophagitis, without bleeding: Secondary | ICD-10-CM

## 2013-11-04 DIAGNOSIS — I6350 Cerebral infarction due to unspecified occlusion or stenosis of unspecified cerebral artery: Secondary | ICD-10-CM

## 2013-11-04 NOTE — Patient Instructions (Signed)
Ischemic Stroke A stroke (cerebrovascular accident) is the sudden death of brain tissue. It is a medical emergency. A stroke can cause permanent loss of brain function. This can cause problems with different parts of your body. A transient ischemic attack (TIA) is different because it does not cause permanent damage. A TIA is a short-lived problem of poor blood flow affecting a part of the brain. A TIA is also a serious problem because having a TIA greatly increases the chances of having a stroke. When symptoms first develop, you cannot know if the problem might be a stroke or TIA. CAUSES  A stroke is caused by a decrease of oxygen supply to an area of your brain. It is usually the result of a small blood clot or collection of cholesterol or fat (plaque) that blocks blood flow in the brain. A stroke can also be caused by blocked or damaged carotid arteries.  RISK FACTORS  High blood pressure (hypertension).  High cholesterol.  Diabetes mellitus.  Heart disease.  The build up of plaque in the blood vessels (peripheral artery disease or atherosclerosis).  The build up of plaque in the blood vessels providing blood and oxygen to the brain (carotid artery stenosis).  An abnormal heart rhythm (atrial fibrillation).  Obesity.  Smoking.  Taking oral contraceptives (especially in combination with smoking).  Physical inactivity.  A diet high in fats, salt (sodium), and calories.  Alcohol use.  Use of illegal drugs (especially cocaine and methamphetamine).  Being African American.  Being over the age of 38.  Family history of stroke.  Previous history of blood clots, stroke, TIA, or heart attack.  Sickle cell disease. SYMPTOMS  These symptoms usually develop suddenly, or may be newly present upon awakening from sleep:  Sudden weakness or numbness of the face, arm, or leg, especially on one side of the body.  Sudden trouble walking or difficulty moving arms or legs.  Sudden  confusion.  Sudden personality changes.  Trouble speaking (aphasia) or understanding.  Difficulty swallowing.  Sudden trouble seeing in one or both eyes.  Double vision.  Dizziness.  Loss of balance or coordination.  Sudden severe headache with no known cause.  Trouble reading or writing. DIAGNOSIS  Your caregiver can often determine the presence or absence of a stroke based on your symptoms, history, and physical exam. Computed tomography (CT) of the brain is usually performed to confirm the stroke, determine causes, and determine stroke severity. Other tests may be done to find the cause of the stroke. These tests may include:  Electrocardiography.  Continuous heart monitoring.  Echocardiography.  Carotid ultrasonography.  Magnetic resonance imaging (MRI).  A scan of the brain circulation.  Blood tests. PREVENTION  The risk of a stroke can be decreased by appropriately treating high blood pressure, high cholesterol, diabetes, heart disease, and obesity and by quitting smoking, limiting alcohol, and staying physically active. TREATMENT  Time is of the essence. It is important to seek treatment within 3 4 hours of the start of symptoms because you may receive a medicine to dissolve the clot (thrombolytic) that cannot be given after that time. Even if you do not know when your symptoms began, get treatment as soon as possible. After the 4 hour window has passed, treatment may include rest, oxygen, intravenous (IV) fluids, and medicines to thin the blood (anticoagulants). Treatment of stroke depends on the duration, severity, and cause of your symptoms. Medicines and diet may be used to address diabetes, high blood pressure, and other risk  factors. Physical, speech, and occupational therapists will assess you and work to improve any functions impaired by the stroke. Measures will be taken to prevent short-term and long-term complications, including infection from breathing  foreign material into the lungs (aspiration pneumonia), blood clots in the legs, bedsores, and falls. Rarely, surgery may be needed to remove large blood clots or to open up blocked arteries. HOME CARE INSTRUCTIONS   Take all medicines prescribed by your caregiver. Follow the directions carefully. Medicines may be used to control risk factors for a stroke. Be sure you understand all your medicine instructions.  You may be told to take aspirin or the anticoagulant warfarin. Warfarin needs to be taken exactly as instructed.  Too much and too little warfarin are both dangerous. Too much warfarin increases the risk of bleeding. Too little warfarin continues to allow the risk for blood clots. While taking warfarin, you will need to have regular blood tests to measure your blood clotting time. These blood tests usually include both the PT and INR tests. The PT and INR results allow your caregiver to adjust your dose of warfarin. The dose can change for many reasons. It is critically important that you take warfarin exactly as prescribed, and that you have your PT and INR levels drawn exactly as directed.  Many foods, especially foods high in vitamin K can interfere with warfarin and affect the PT and INR results. Foods high in vitamin K include spinach, kale, broccoli, cabbage, collard and turnip greens, brussels sprouts, peas, cauliflower, seaweed, and parsley as well as beef and pork liver, green tea, and soybean oil. You should eat a consistent amount of foods high in vitamin K. Avoid major changes in your diet, or notify your caregiver before changing your diet. Arrange a visit with a dietitian to answer your questions.  Many medicines can interfere with warfarin and affect the PT and INR results. You must tell your caregiver about any and all medicines you take, this includes all vitamins and supplements. Be especially cautious with aspirin and anti-inflammatory medicines. Do not take or discontinue any  prescribed or over-the-counter medicine except on the advice of your caregiver or pharmacist.  Warfarin can have side effects, such as excessive bruising or bleeding. You will need to hold pressure over cuts for longer than usual. Your caregiver or pharmacist will discuss other potential side effects.  Avoid sports or activities that may cause injury or bleeding.  Be mindful when shaving, flossing your teeth, or handling sharp objects.  Alcohol can change the body's ability to handle warfarin. It is best to avoid alcoholic drinks or consume only very small amounts while taking warfarin. Notify your caregiver if you change your alcohol intake.  Notify your dentist or other caregivers before procedures.  If swallow studies have determined that your swallowing reflex is present, you should eat healthy foods. A diet that includes 5 or more servings of fruits and vegetables a day may reduce the risk of stroke. Foods may need to be a special consistency (soft or pureed), or small bites may need to be taken in order to avoid aspirating or choking. Certain diets may be prescribed to address high blood pressure, high cholesterol, diabetes, or obesity.  A low-sodium, low-saturated fat, low-trans fat, low-cholesterol diet is recommended to manage high blood pressure.  A low-saturated fat, low-trans fat, low-cholesterol, and high-fiber diet may control cholesterol levels.  A controlled-carbohydrate, controlled-sugar diet is recommended to manage diabetes.  A reduced-calorie, low-sodium, low-saturated fat, low-trans fat, low-cholesterol diet  is recommended to manage obesity.  Maintain a healthy weight.  Stay physically active. It is recommended that you get at least 30 minutes of activity on most or all days.  Do not smoke.  Limit alcohol use even if you are not taking warfarin. Moderate alcohol use is considered to be:  No more than 2 drinks each day for men.  No more than 1 drink each day for  nonpregnant women.  Stop drug abuse.  Home safety. A safe home environment is important to reduce the risk of falls. Your caregiver may arrange for specialists to evaluate your home. Having grab bars in the bedroom and bathroom is often important. Your caregiver may arrange for equipment to be used at home, such as raised toilets and a seat for the shower.  Physical, occupational, and speech therapy. Ongoing therapy may be needed to maximize your recovery after a stroke. If you have been advised to use a walker or a cane, use it at all times. Be sure to keep your therapy appointments.  Follow all instructions for follow-up with your caregiver. This is very important. This includes any referrals, physical therapy, rehabilitation, and lab tests. Proper follow up can prevent another stroke from occurring. SEEK MEDICAL CARE IF:  You have personality changes.  You have difficulty swallowing.  You are seeing double.  You have dizziness.  You have a fever.  You have skin breakdown. SEEK IMMEDIATE MEDICAL CARE IF:  Any of these symptoms may represent a serious problem that is an emergency. Do not wait to see if the symptoms will go away. Get medical help right away. Call your local emergency services (911 in [place] Do not drive yourself to the hospital.  You have sudden weakness or numbness of the face, arm, or leg, especially on one side of the body.  You have sudden trouble walking or difficulty moving arms or legs.  You have sudden confusion.  You have trouble speaking (aphasia) or understanding.  You have sudden trouble seeing in one or both eyes.  You have a loss of balance or coordination.  You have a sudden, severe headache with no known cause.  You have new chest pain or an irregular heartbeat.  You have a partial or total loss of consciousness.   Document Released: 08/04/2005 Document Revised: 04/06/2013 Document Reviewed: 03/14/2012 [facility] Patient Information 2014  [place].

## 2013-11-05 ENCOUNTER — Encounter: Payer: Self-pay | Admitting: Internal Medicine

## 2013-11-05 MED ORDER — OMEPRAZOLE 40 MG PO CPDR: 40.0000 mg | DELAYED_RELEASE_CAPSULE | Freq: Every morning | ORAL | Status: DC

## 2013-11-05 NOTE — Progress Notes (Signed)
Subjective:    Patient ID: Ashley [name], female    DOB: May 06, 1946, 68 y.o.   MRN: [id number]  HPI Very nice 68 yo MWF with HTN, Morbid Obesity , poorly compliant T1 IDDM who apparently fell w/o LOC the night before in her bathroom as her Lt leg gave way and she also reports new onset of associated circumferential paresthesias and loss of sensation of the distal Lt lower extremity in a stocking distribution. She denies any promontory Sx's as HA, hypoglycemic Sx's, other neurological Sx's or CV Sx's as dizziness/lightheadedness, CP or palpitations. She denies any weakness of the extremities - then - since or currently.  Medication Sig  . allopurinol (ZYLOPRIM) 300 MG tablet Take 1 tablet (300 mg total) by mouth every morning.  Marland Kitchen atorvastatin (LIPITOR) 40 MG tablet Take 1 tablet (40 mg total) by mouth daily.  . Biotin 10 MG CAPS Take by mouth.  Marland Kitchen buPROPion (WELLBUTRIN) 100 MG tablet Take 1 tablet (100 mg total) by mouth daily.  . clonazePAM (KLONOPIN) 2 MG tablet Take 2 mg by mouth 2 (two) times daily. 1 in the am, 2 in the pm  . Dapagliflozin Propanediol (FARXIGA) 10 MG TABS Take 1 capsule by mouth daily.  . DiphenhydrAMINE HCl 25 mg Take 2 tablets by mouth at bedtime.  . enalapril (VASOTEC) 20 MG tablet TAKE  1 tab DAILY for BP  . fenofibrate micronized (LOFIBRA) 134 MG capsule Take 1 capsule (134 mg total) by mouth daily.  . fluconazole (DIFLUCAN) 150 MG tablet Take 1 tablet (150 mg total) by mouth daily.  Marland Kitchen FLUoxetine (PROZAC) 40 MG capsule Take 1 capsule (40 mg total) by mouth daily.  Marland Kitchen gabapentin (NEURONTIN) 100 MG capsule Take 100 mg  QAM.  . gabapentin (NEURONTIN) 300 MG capsule Take 300 mg  QPM  . hydrochlorothiazide (HYDRODIURIL) 25 MG tablet Take 1 tablet (25 mg total) by mouth every other day.  . insulin NPH Human (NOVOLIN N) 100 UNIT/ML i Adjusts insulin by SS. May use up to 30 units BID  . levothyroxine (LEVOTHROID) 100 MCG tablet TAKE 1 TABLET EVERY DAY  . metFORMIN (GLUCOPHAGE)  1000 MG tablet 500MG  IN AND AM AND 1000MG  PM  . nystatin  100000 UNIT/GM POWD One application of powder twice daily as needed  . omeprazole (PRILOSEC) 20 MG capsule Take 1 capsule (20 mg total) by mouth every morning.  Marland Kitchen albuterol ( HFA) 108 (90 BASE) MCG/ACT inhaler Inhale 1-2 puffs every 6 (six) hours with aerochamber  . fluticasone (FLONASE) 50 MCG/ACT nasal spray Place 2 sprays into the nose as needed.   Allergies  Allergen Reactions  . Sulfa Antibiotics Other (See Comments)    Pt states she had "extreme pain"  . Sulfacetamide Sodium     Past Medical History  Diagnosis Date  . Diabetes mellitus, Type I   . Asthma   . Hypertension   . Hypercholesteremia   . Gout LEFT FOOT-  STABLE  . Neuropathy of lower extremity   . Bipolar 1 disorder   . OCD (obsessive compulsive disorder)   . Depression   . SOB (shortness of breath) on exertion   . GERD (gastroesophageal reflux disease)   . H/O hiatal hernia   . SUI (stress urinary incontinence, female)   . Arthritis LOWER BACK  . History of CVA  FOUND PER MRI 1994--  RESIDUAL MEMORY IMPAIRED  . Vulvar lesion   . Anxiety disorder   . Carpal tunnel syndrome   . Peripheral neuropathy   .  Dyslipidemia   . Gait disorder    Review of Systems   Negative except as above   Objective:   Physical Exam                                BP 128/74  Pulse 80  Temp 97.5 F  Resp 16  Ht 5' 3.5"   Wt 185 lb 6.4 oz   BMI 32.32 kg/m2  HEENT - Eac's patent. TM's Nl.EOM's full. PERRLA. NasoOroPharynx clear. Neck - supple. Nl Thyroid. No bruits nodes JVD Chest - Clear equal BS Cor - Nl HS. RRR w/o sig MGR. PP 1(+) No edema. Abd - No palpable organomegaly, masses or tenderness. BS nl. MS- FROM. w/o deformities. Muscle power tone and bulk Nl. Gait Nl. Neuro - No obvious Cr N abnormalities. Motor and Cerebellar functions appear Nl w/o focal abnormalities. There is a marked decrease in sensation to pinprick in a stocking distribution in Lt foot (but  not the Rt)  Assessment & Plan:   1. Left leg paresthesias  - CT Head Wo Contrast; Future  2. CVA (cerebral infarction), R/O  - CT Head Wo Contrast; Future  3. Reflux esophagitis  - Increase dose of Omeprazole 40 mg

## 2013-11-10 ENCOUNTER — Telehealth: Payer: Self-pay | Admitting: *Deleted

## 2013-11-10 NOTE — Telephone Encounter (Signed)
PT AWARE PER LINDSAY

## 2013-11-11 ENCOUNTER — Telehealth: Payer: Self-pay | Admitting: Internal Medicine

## 2013-11-11 NOTE — Telephone Encounter (Signed)
PT CALLED TO GET INSTRUCTIONS FOR HEAD MRI, CAN YOU MAKE SURE IT IS AN OPEN MRI.  CAN SHE HAVE AN RX FOR NERVES DURING MRI, OR ORDERS FOR MEDICINE ALREADY TAKING. PHARM: [facility]. PLEASE ADVISE PT.

## 2013-11-14 ENCOUNTER — Telehealth: Payer: Self-pay

## 2013-11-14 NOTE — Telephone Encounter (Signed)
Pt is having CT scan 11-15-13 @ 3:40. Pt requesting Rx for her nerves before appt. Per Dr. Melford Aase, pt can take 2 mg of Clonazepam, but will need a driver. Pt aware and states she will have a driver.

## 2013-11-15 ENCOUNTER — Other Ambulatory Visit: Payer: Self-pay | Admitting: Internal Medicine

## 2013-11-15 ENCOUNTER — Ambulatory Visit
Admission: RE | Admit: 2013-11-15 | Discharge: 2013-11-15 | Disposition: A | Discharge status: Home / Self Care | Payer: Commercial Managed Care - HMO | Source: Ambulatory Visit | Attending: Internal Medicine | Admitting: Internal Medicine

## 2013-11-15 DIAGNOSIS — R202 Paresthesia of skin: Secondary | ICD-10-CM

## 2013-11-15 DIAGNOSIS — I639 Cerebral infarction, unspecified: Secondary | ICD-10-CM

## 2013-12-01 ENCOUNTER — Encounter: Payer: Self-pay | Admitting: Internal Medicine

## 2013-12-01 ENCOUNTER — Ambulatory Visit (INDEPENDENT_AMBULATORY_CARE_PROVIDER_SITE_OTHER): Payer: Medicare HMO | Admitting: Internal Medicine

## 2013-12-01 VITALS — BP 126/78 | HR 72 | Temp 97.8°F | Resp 16 | Wt 184.8 lb

## 2013-12-01 DIAGNOSIS — E559 Vitamin D deficiency, unspecified: Secondary | ICD-10-CM

## 2013-12-01 DIAGNOSIS — M109 Gout, unspecified: Secondary | ICD-10-CM

## 2013-12-01 DIAGNOSIS — D518 Other vitamin B12 deficiency anemias: Secondary | ICD-10-CM

## 2013-12-01 DIAGNOSIS — B379 Candidiasis, unspecified: Secondary | ICD-10-CM

## 2013-12-01 DIAGNOSIS — I10 Essential (primary) hypertension: Secondary | ICD-10-CM

## 2013-12-01 DIAGNOSIS — Z79899 Other long term (current) drug therapy: Secondary | ICD-10-CM

## 2013-12-01 DIAGNOSIS — E782 Mixed hyperlipidemia: Secondary | ICD-10-CM

## 2013-12-01 DIAGNOSIS — E1029 Type 1 diabetes mellitus with other diabetic kidney complication: Secondary | ICD-10-CM

## 2013-12-01 MED ORDER — FLUCONAZOLE 150 MG PO TABS: ORAL_TABLET | ORAL | Status: DC

## 2013-12-01 MED ORDER — FLUTICASONE PROPIONATE 50 MCG/ACT NA SUSP: 2.0000 | Freq: Every day | NASAL | Status: DC

## 2013-12-01 NOTE — Patient Instructions (Signed)

## 2013-12-01 NOTE — Progress Notes (Signed)
Patient ID: Ashley [name], female   DOB: [birth year], 68 y.o.   MRN: [id number]    This very nice 67 y.o. MWF presents for 3 month follow up with Hypertension, Hyperlipidemia, Pre-Diabetes and Vitamin D Deficiency.    HTN predates since 1984. BP has been controlled at home. Today's BP: 126/78 mmHg . Patient denies any cardiac type chest pain, palpitations, dyspnea/orthopnea/PND, dizziness, claudication, or dependent edema.   Hyperlipidemia is controlled with diet & meds. Last Cholesterol was 154, Triglycerides were  472, HDL 34 and LDL not calculated in Oct 2014.Marland Kitchen Patient denies myalgias or other med SE's.    Also, the patient has history of poorly compliant and hence poorly controlled T2 NIDDM wit addition of insulin to avoid hospitalizations due to patient's poor dietary compliance and last A1c was 12.1 in Oct 2014. She infrequently monitors CBG's which usually range in the 200-300's.  GFR calculated 40 ml/min in Jan 2015 consistent with Stage 3 CKDPatient denies any symptoms of reactive hypoglycemia, diabetic polys, paresthesias or visual blurring.   Further, Patient has history of Vitamin D Deficiency with last vitamin D of 30 in Oct 2014. Patient supplements vitamin D without any suspected side-effects.  Medication Sig  . allopurinol (ZYLOPRIM) 300 MG tablet Take 1 tablet (300 mg total) by mouth every morning.  Marland Kitchen atorvastatin (LIPITOR) 40 MG tablet Take 1 tablet (40 mg total) by mouth daily.  . clonazePAM (KLONOPIN) 2 MG tablet Take 2 mg by mouth 2 (two) times daily.   . DiphenhydrAMINE HCl, Sleep,  Take 2 tablets by mouth at bedtime.  . enalapril (VASOTEC) 20 MG tablet TAKE ONE TABLET BY MOUTH EVERY DAY FOR BLOOD PRESSURE  . FARXIGA 10 MG TABS TAKE 1 TABLET BY MOUTH EVERY MORNING FOR DIABETES  . fenofibrate micronized (LOFIBRA) 134 MG capsule Take 1 capsule (134 mg total) by mouth daily.  Marland Kitchen FLUoxetine (PROZAC) 40 MG capsule Take 1 capsule (40 mg total) by mouth daily.  Marland Kitchen gabapentin  (NEURONTIN) 100 MG capsule Take 100 mg by mouth daily.  Marland Kitchen gabapentin (NEURONTIN) 300 MG capsule Take 1 capsule (300 mg total) by mouth every evening.  . hydrochlorothiazide (HYDRODIURIL) 25 MG tablet Take 1 tablet (25 mg total) by mouth every other day.  . insulin NPH Human (NOVOLIN N) 100 UNIT/ML  Patient  adjusts insulin to BS readings and diet. May use up to 30 units BID  . Levothyroxine 100 MCG tablet TAKE 1 TABLET BY MOUTH EVERY DAY  . metFORMIN (GLUCOPHAGE) 1000 MG tablet Take 2 tabs daily  . nystatin (MYCOSTATIN/NYSTOP) 100000 UNIT/GM  One application of powder twice daily as needed  . omeprazole (PRILOSEC) 40 MG capsule Take 1 capsule (40 mg total) by mouth every morning  . albuterol / VENTOLIN HFA Inhale 1-2 puffs into the lungs every 6 (six) hours as needed   . Biotin 10 MG CAPS Take by mouth.  Marland Kitchen buPROPion (WELLBUTRIN) 100 MG tablet Take 1 tablet (100 mg total) by mouth daily.   Allergies  Allergen Reactions  . Sulfa Antibiotics Other (See Comments)    Pt states she had "extreme pain"  . Sulfacetamide Sodium    PMHx:   Past Medical History  Diagnosis Date  . Diabetes mellitus   . Asthma   . Hypertension   . Hypercholesteremia   . Gout LEFT FOOT-  STABLE  . Neuropathy of lower extremity   . Bipolar 1 disorder   . OCD (obsessive compulsive disorder)   . Depression   . SOB (shortness  of breath) on exertion   . GERD (gastroesophageal reflux disease)   . H/O hiatal hernia   . SUI (stress urinary incontinence, female)   . Arthritis LOWER BACK  . History of CVA (cerebrovascular accident) FOUND PER MRI 1994--  RESIDUAL MEMORY IMPAIRED  . Vulvar lesion   . Anxiety disorder   . Carpal tunnel syndrome   . Peripheral neuropathy   . Dyslipidemia   . Gait disorder    FHx:    Reviewed / unchanged  SHx:    Reviewed / unchanged   Systems Review: Constitutional: Denies fever, chills, wt changes, headaches, insomnia, fatigue, night sweats, change in appetite. Eyes: Denies  redness, blurred vision, diplopia, discharge, itchy, watery eyes.  ENT: Denies discharge, congestion, post nasal drip, epistaxis, sore throat, earache, hearing loss, dental pain, tinnitus, vertigo, sinus pain, snoring.  CV: Denies chest pain, palpitations, irregular heartbeat, syncope, dyspnea, diaphoresis, orthopnea, PND, claudication, edema. Respiratory: denies cough, dyspnea, DOE, pleurisy, hoarseness, laryngitis, wheezing.  Gastrointestinal: Denies dysphagia, odynophagia, heartburn, reflux, water brash, abdominal pain or cramps, nausea, vomiting, bloating, diarrhea, constipation, hematemesis, melena, hematochezia,  or hemorrhoids. Genitourinary: Denies dysuria, frequency, urgency, nocturia, hesitancy, discharge, hematuria, flank pain. Musculoskeletal: Denies arthralgias, myalgias, stiffness, jt. swelling, pain, limp, strain/sprain.  Skin: Denies pruritus, rash, hives, warts, acne, eczema, change in skin lesion(s). Neuro: No weakness, tremor, incoordination, spasms, paresthesia, or pain. Psychiatric: Denies confusion, memory loss, or sensory loss. Endo: Denies change in weight, skin, hair change.  Heme/Lymph: No excessive bleeding, bruising, orenlarged lymph nodes.  Exam:  BP 126/78  Pulse 72  Temp(Src) 97.8 F (36.6 C)  Resp 16  Wt 184 lb 12.8 oz (83.825 kg)  Appears well nourished - in no distress. Eyes: PERRLA, EOMs, conjunctiva no swelling or erythema. Sinuses: No frontal/maxillary tenderness ENT/Mouth: EAC's clear, TM's nl w/o erythema, bulging. Nares clear w/o erythema, swelling, exudates. Oropharynx clear without erythema or exudates. Oral hygiene is good. Tongue normal, non obstructing. Hearing intact.  Neck: Supple. Thyroid nl. Car 2+/2+ without bruits, nodes or JVD. Chest: Respirations nl with BS clear & equal w/o rales, rhonchi, wheezing or stridor.  Cor: Heart sounds normal w/ regular rate and rhythm without sig. murmurs, gallops, clicks, or rubs. Peripheral pulses normal  and equal  without edema.  Abdomen: Soft & bowel sounds normal. Non-tender w/o guarding, rebound, hernias, masses, or organomegaly.   Musculoskeletal: Full ROM all peripheral extremities, joint stability, 5/5 strength, and normal gait.  Skin: Warm, dry without exposed rashes, lesions, ecchymosis apparent.  Neuro: Cranial nerves intact, reflexes equal bilaterally. Sensory-motor testing grossly intact. Tendon reflexes grossly intact.  Pysch: Alert & oriented x 3. Insight and judgement nl & appropriate. No ideations.  Assessment and Plan:  1. Hypertension - Continue monitor blood pressure at home. Continue diet/meds same.  2. Hyperlipidemia - Continue diet/meds, exercise,& lifestyle modifications. Continue monitor periodic cholesterol/liver & renal functions   3. T1 IDDM w/Stage 3 CKD (GFR 40 ml/min) - continue recommend prudent low glycemic diet, weight control, regular exercise, diabetic monitoring and periodic eye exams.  4. Vitamin D Deficiency - Recommend supplementation.  Recommended regular exercise, BP monitoring, weight control, and discussed med and SE's. Recommended labs to assess and monitor clinical status. Further disposition pending results of labs.

## 2013-12-02 LAB — HEMOGLOBIN A1C
Hgb A1c MFr Bld: 11.7 % — ABNORMAL HIGH (ref <5.7)
Mean Plasma Glucose: 289 mg/dL — ABNORMAL HIGH (ref <117)

## 2013-12-02 LAB — HEPATIC FUNCTION PANEL
ALT: 55 U/L — ABNORMAL HIGH (ref 0–35)
AST: 40 U/L — ABNORMAL HIGH (ref 0–37)
Albumin: 4.1 g/dL (ref 3.5–5.2)
Alkaline Phosphatase: 67 U/L (ref 39–117)
Bilirubin, Direct: 0.1 mg/dL (ref 0.0–0.3)
Indirect Bilirubin: 0.4 mg/dL (ref 0.2–1.2)
Total Bilirubin: 0.5 mg/dL (ref 0.2–1.2)
Total Protein: 6.9 g/dL (ref 6.0–8.3)

## 2013-12-02 LAB — CBC WITH DIFFERENTIAL/PLATELET
Basophils Absolute: 0 10*3/uL (ref 0.0–0.1)
Basophils Relative: 0 % (ref 0–1)
Eosinophils Absolute: 0.2 10*3/uL (ref 0.0–0.7)
Eosinophils Relative: 2 % (ref 0–5)
HCT: 42.2 % (ref 36.0–46.0)
Hemoglobin: 14.2 g/dL (ref 12.0–15.0)
Lymphocytes Relative: 21 % (ref 12–46)
Lymphs Abs: 1.7 10*3/uL (ref 0.7–4.0)
MCH: 30.2 pg (ref 26.0–34.0)
MCHC: 33.6 g/dL (ref 30.0–36.0)
MCV: 89.8 fL (ref 78.0–100.0)
Monocytes Absolute: 0.4 10*3/uL (ref 0.1–1.0)
Monocytes Relative: 5 % (ref 3–12)
Neutro Abs: 6 10*3/uL (ref 1.7–7.7)
Neutrophils Relative %: 72 % (ref 43–77)
Platelets: 315 10*3/uL (ref 150–400)
RBC: 4.7 MIL/uL (ref 3.87–5.11)
RDW: 14.3 % (ref 11.5–15.5)
WBC: 8.3 10*3/uL (ref 4.0–10.5)

## 2013-12-02 LAB — LIPID PANEL
Cholesterol: 198 mg/dL (ref 0–200)
HDL: 35 mg/dL — ABNORMAL LOW (ref >39)
Total CHOL/HDL Ratio: 5.7 Ratio
Triglycerides: 562 mg/dL — ABNORMAL HIGH (ref <150)

## 2013-12-02 LAB — BASIC METABOLIC PANEL WITH GFR
BUN: 27 mg/dL — ABNORMAL HIGH (ref 6–23)
CO2: 24 mEq/L (ref 19–32)
Calcium: 10 mg/dL (ref 8.4–10.5)
Chloride: 97 mEq/L (ref 96–112)
Creat: 1.01 mg/dL (ref 0.50–1.10)
GFR, Est African American: 67 mL/min
GFR, Est Non African American: 58 mL/min — ABNORMAL LOW
Glucose, Bld: 256 mg/dL — ABNORMAL HIGH (ref 70–99)
Potassium: 4.7 mEq/L (ref 3.5–5.3)
Sodium: 134 mEq/L — ABNORMAL LOW (ref 135–145)

## 2013-12-02 LAB — TSH: TSH: 0.582 u[IU]/mL (ref 0.350–4.500)

## 2013-12-02 LAB — VITAMIN D 25 HYDROXY (VIT D DEFICIENCY, FRACTURES): Vit D, 25-Hydroxy: 20 ng/mL — ABNORMAL LOW (ref 30–89)

## 2013-12-02 LAB — INSULIN, FASTING: Insulin fasting, serum: 31 u[IU]/mL — ABNORMAL HIGH (ref 3–28)

## 2013-12-02 LAB — MAGNESIUM: Magnesium: 1.7 mg/dL (ref 1.5–2.5)

## 2013-12-05 ENCOUNTER — Other Ambulatory Visit: Payer: Self-pay | Admitting: Internal Medicine

## 2013-12-19 ENCOUNTER — Other Ambulatory Visit: Payer: Self-pay | Admitting: Internal Medicine

## 2014-01-09 ENCOUNTER — Telehealth (INDEPENDENT_AMBULATORY_CARE_PROVIDER_SITE_OTHER): Payer: Self-pay | Admitting: Gynecologic Oncology
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
## 2014-01-10 ENCOUNTER — Ambulatory Visit: Payer: Medicare HMO | Attending: Gynecologic Oncology | Admitting: Gynecologic Oncology

## 2014-01-10 ENCOUNTER — Other Ambulatory Visit (HOSPITAL_COMMUNITY)
Admission: RE | Admit: 2014-01-10 | Discharge: 2014-01-10 | Disposition: A | Discharge status: Home / Self Care | Payer: Medicare HMO | Source: Ambulatory Visit | Attending: Gynecologic Oncology | Admitting: Gynecologic Oncology

## 2014-01-10 VITALS — BP 103/65 | HR 107 | Temp 97.7°F | Resp 18 | Ht 63.5 in | Wt 184.4 lb

## 2014-01-10 DIAGNOSIS — E1149 Type 2 diabetes mellitus with other diabetic neurological complication: Secondary | ICD-10-CM | POA: Insufficient documentation

## 2014-01-10 DIAGNOSIS — F319 Bipolar disorder, unspecified: Secondary | ICD-10-CM | POA: Insufficient documentation

## 2014-01-10 DIAGNOSIS — Z124 Encounter for screening for malignant neoplasm of cervix: Secondary | ICD-10-CM | POA: Insufficient documentation

## 2014-01-10 DIAGNOSIS — F429 Obsessive-compulsive disorder, unspecified: Secondary | ICD-10-CM | POA: Insufficient documentation

## 2014-01-10 DIAGNOSIS — J45909 Unspecified asthma, uncomplicated: Secondary | ICD-10-CM | POA: Insufficient documentation

## 2014-01-10 DIAGNOSIS — K219 Gastro-esophageal reflux disease without esophagitis: Secondary | ICD-10-CM | POA: Insufficient documentation

## 2014-01-10 DIAGNOSIS — E1142 Type 2 diabetes mellitus with diabetic polyneuropathy: Secondary | ICD-10-CM | POA: Insufficient documentation

## 2014-01-10 DIAGNOSIS — M109 Gout, unspecified: Secondary | ICD-10-CM | POA: Insufficient documentation

## 2014-01-10 DIAGNOSIS — Z8673 Personal history of transient ischemic attack (TIA), and cerebral infarction without residual deficits: Secondary | ICD-10-CM | POA: Insufficient documentation

## 2014-01-10 DIAGNOSIS — F411 Generalized anxiety disorder: Secondary | ICD-10-CM | POA: Insufficient documentation

## 2014-01-10 DIAGNOSIS — C519 Malignant neoplasm of vulva, unspecified: Secondary | ICD-10-CM

## 2014-01-10 DIAGNOSIS — I10 Essential (primary) hypertension: Secondary | ICD-10-CM | POA: Insufficient documentation

## 2014-01-10 NOTE — Addendum Note (Signed)
Addended by: Lucile Crater on: 01/10/2014 03:10 PM   Modules accepted: Orders
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
## 2014-01-10 NOTE — Patient Instructions (Addendum)
Follow-up with Gyn Onc in 6 months   Thank you very much Ms. Ashley [name] for allowing me to provide care for you today.  I appreciate your confidence in choosing our Gynecologic Oncology team.  If you have any questions about your visit today please call our office and we will get back to you as soon as possible.  Francetta Found. Novak Stgermaine MD., PhD Gynecologic Oncology

## 2014-01-12 ENCOUNTER — Ambulatory Visit: Payer: Medicare Other | Admitting: Gynecologic Oncology

## 2014-01-16 ENCOUNTER — Telehealth: Payer: Self-pay | Admitting: *Deleted

## 2014-01-16 NOTE — Telephone Encounter (Signed)
Notified pt of Pap results

## 2014-03-07 ENCOUNTER — Ambulatory Visit: Payer: Self-pay | Admitting: Emergency Medicine

## 2014-03-20 ENCOUNTER — Other Ambulatory Visit: Payer: Self-pay | Admitting: Emergency Medicine

## 2014-03-20 MED ORDER — GABAPENTIN 100 MG PO CAPS
100.0000 mg | ORAL_CAPSULE | Freq: Two times a day (BID) | ORAL | Status: DC
Start: 2014-03-20 — End: 2015-01-01

## 2014-03-20 MED ORDER — GABAPENTIN 300 MG PO CAPS: 300.0000 mg | ORAL_CAPSULE | Freq: Every evening | ORAL | Status: DC

## 2014-03-20 MED ORDER — METFORMIN HCL 1000 MG PO TABS: 1000.0000 mg | ORAL_TABLET | Freq: Two times a day (BID) | ORAL | Status: DC

## 2014-03-28 ENCOUNTER — Encounter: Payer: Self-pay | Admitting: Physician Assistant

## 2014-03-28 ENCOUNTER — Ambulatory Visit (INDEPENDENT_AMBULATORY_CARE_PROVIDER_SITE_OTHER): Payer: Commercial Managed Care - HMO | Admitting: Physician Assistant

## 2014-03-28 VITALS — BP 120/78 | HR 76 | Temp 97.9°F | Resp 16 | Ht 63.5 in | Wt 185.0 lb

## 2014-03-28 DIAGNOSIS — E559 Vitamin D deficiency, unspecified: Secondary | ICD-10-CM

## 2014-03-28 DIAGNOSIS — Z79899 Other long term (current) drug therapy: Secondary | ICD-10-CM

## 2014-03-28 DIAGNOSIS — I10 Essential (primary) hypertension: Secondary | ICD-10-CM

## 2014-03-28 DIAGNOSIS — E1029 Type 1 diabetes mellitus with other diabetic kidney complication: Secondary | ICD-10-CM

## 2014-03-28 DIAGNOSIS — F329 Major depressive disorder, single episode, unspecified: Secondary | ICD-10-CM

## 2014-03-28 DIAGNOSIS — G63 Polyneuropathy in diseases classified elsewhere: Secondary | ICD-10-CM

## 2014-03-28 DIAGNOSIS — F3289 Other specified depressive episodes: Secondary | ICD-10-CM

## 2014-03-28 DIAGNOSIS — E782 Mixed hyperlipidemia: Secondary | ICD-10-CM

## 2014-03-28 DIAGNOSIS — D518 Other vitamin B12 deficiency anemias: Secondary | ICD-10-CM

## 2014-03-28 DIAGNOSIS — L309 Dermatitis, unspecified: Secondary | ICD-10-CM

## 2014-03-28 DIAGNOSIS — M1A3720 Chronic gout due to renal impairment, left ankle and foot, without tophus (tophi): Secondary | ICD-10-CM

## 2014-03-28 LAB — CBC WITH DIFFERENTIAL/PLATELET
Basophils Absolute: 0 10*3/uL (ref 0.0–0.1)
Basophils Relative: 0 % (ref 0–1)
Eosinophils Absolute: 0.2 10*3/uL (ref 0.0–0.7)
Eosinophils Relative: 3 % (ref 0–5)
HCT: 42.4 % (ref 36.0–46.0)
Hemoglobin: 14.1 g/dL (ref 12.0–15.0)
Lymphocytes Relative: 27 % (ref 12–46)
Lymphs Abs: 2.2 10*3/uL (ref 0.7–4.0)
MCH: 29.7 pg (ref 26.0–34.0)
MCHC: 33.3 g/dL (ref 30.0–36.0)
MCV: 89.5 fL (ref 78.0–100.0)
Monocytes Absolute: 0.5 10*3/uL (ref 0.1–1.0)
Monocytes Relative: 6 % (ref 3–12)
Neutro Abs: 5.1 10*3/uL (ref 1.7–7.7)
Neutrophils Relative %: 64 % (ref 43–77)
Platelets: 266 10*3/uL (ref 150–400)
RBC: 4.74 MIL/uL (ref 3.87–5.11)
RDW: 14.1 % (ref 11.5–15.5)
WBC: 8 10*3/uL (ref 4.0–10.5)

## 2014-03-28 LAB — HEPATIC FUNCTION PANEL
ALT: 41 U/L — ABNORMAL HIGH (ref 0–35)
AST: 25 U/L (ref 0–37)
Albumin: 4.3 g/dL (ref 3.5–5.2)
Alkaline Phosphatase: 66 U/L (ref 39–117)
Bilirubin, Direct: 0.1 mg/dL (ref 0.0–0.3)
Indirect Bilirubin: 0.4 mg/dL (ref 0.2–1.2)
Total Bilirubin: 0.5 mg/dL (ref 0.2–1.2)
Total Protein: 7 g/dL (ref 6.0–8.3)

## 2014-03-28 LAB — BASIC METABOLIC PANEL WITH GFR
BUN: 23 mg/dL (ref 6–23)
CO2: 29 mEq/L (ref 19–32)
Calcium: 10.1 mg/dL (ref 8.4–10.5)
Chloride: 90 mEq/L — ABNORMAL LOW (ref 96–112)
Creat: 1.08 mg/dL (ref 0.50–1.10)
GFR, Est African American: 62 mL/min
GFR, Est Non African American: 54 mL/min — ABNORMAL LOW
Glucose, Bld: 269 mg/dL — ABNORMAL HIGH (ref 70–99)
Potassium: 3.8 mEq/L (ref 3.5–5.3)
Sodium: 130 mEq/L — ABNORMAL LOW (ref 135–145)

## 2014-03-28 LAB — LIPID PANEL
Cholesterol: 201 mg/dL — ABNORMAL HIGH (ref 0–200)
HDL: 34 mg/dL — ABNORMAL LOW (ref >39)
Total CHOL/HDL Ratio: 5.9 Ratio
Triglycerides: 682 mg/dL — ABNORMAL HIGH (ref <150)

## 2014-03-28 LAB — MAGNESIUM: Magnesium: 1.7 mg/dL (ref 1.5–2.5)

## 2014-03-28 LAB — URIC ACID: Uric Acid, Serum: 3.1 mg/dL (ref 2.4–7.0)

## 2014-03-28 MED ORDER — ALBUTEROL SULFATE HFA 108 (90 BASE) MCG/ACT IN AERS: 2.0000 | INHALATION_SPRAY | Freq: Four times a day (QID) | RESPIRATORY_TRACT | Status: DC | PRN

## 2014-03-28 MED ORDER — LOSARTAN POTASSIUM 50 MG PO TABS: 50.0000 mg | ORAL_TABLET | Freq: Two times a day (BID) | ORAL | Status: DC

## 2014-03-28 MED ORDER — ERYTHROMYCIN 2 % EX GEL: Freq: Two times a day (BID) | CUTANEOUS | Status: DC

## 2014-03-28 MED ORDER — CLOBETASOL PROPIONATE 0.05 % EX SOLN: 1.0000 "application " | Freq: Two times a day (BID) | CUTANEOUS | Status: DC

## 2014-03-28 NOTE — Progress Notes (Signed)
Assessment and Plan:  Hypertension: Continue medication, monitor blood pressure at home. Continue DASH diet. Cholesterol: Continue diet and exercise. Check cholesterol.  Diabetes-Continue diet and exercise. Check A1C Vitamin D Def- check level and continue medications.  Small white bumps on bilateral arms at bruising- will try erythromycin gel cream, if not better send to Derm Dry scaly patches on left ear- treat with cream but follow up with derm to rule out SCC Left foot pain-? Gout- check uric acid and CBC Cough- stop smoking, stop ACE switch to losartan 50mg  QD, albuterol called in, and will get CXR if not better Poor insight- LONG discussion about compliance and seriousness of her DM/medical morbidities.   Continue diet and meds as discussed. Further disposition pending results of labs. Discussed med's effects and SE's.  OVER 40 minutes of exam, counseling, chart review, referral performed   HPI 68 y.o. female  presents for 3 month follow up with hypertension, hyperlipidemia, diabetes and vitamin D. Her blood pressure has been controlled at home, today their BP is BP: 120/78 mmHg She does not workout due to neuropathy. She denies chest pain, shortness of breath, dizziness.  She is on cholesterol medication and denies myalgias. Her cholesterol is not at goal. The cholesterol last visit was:   Lab Results  Component Value Date   CHOL 198 12/01/2013   HDL 35* 12/01/2013   LDLCALC NOT CALC 12/01/2013   TRIG 562* 12/01/2013   CHOLHDL 5.7 12/01/2013   She has not been working on diet and exercise for Diabetes, she has DM neuropathy, on gabapentin, continues to drink sweet tea, continues to smoke, has poor insight and denies polydipsia and polyuria. Last A1C in the office was:  Lab Results  Component Value Date   HGBA1C 11.7* 12/01/2013   Patient is on Vitamin D supplement. Lab Results  Component Value Date   VD25OH 20* 12/01/2013     She states that her left foot has been very painful,  keeping her up at night, with erythema, swelling, tenderness.  Dr. [name] put her on ritalin but she states it made her angry so she has taken her self off, she has a follow up with Dr. [name].  She states she uses curlers at night that is causing a rash.  She is on thyroid medication. Her medication was not changed last visit. Patient denies nervousness, palpitations and weight changes.  Lab Results  Component Value Date   TSH 0.582 12/01/2013  .  She complains of a cough, that occ has production, no wheezing.   Current Medications:  Current Outpatient Prescriptions on File Prior to Visit  Medication Sig Dispense Refill  . allopurinol (ZYLOPRIM) 300 MG tablet Take 1 tablet (300 mg total) by mouth every morning.  90 tablet  2  . atorvastatin (LIPITOR) 40 MG tablet Take 1 tablet (40 mg total) by mouth daily.  90 tablet  2  . Biotin 10 MG CAPS Take by mouth.      Marland Kitchen buPROPion (WELLBUTRIN) 100 MG tablet Take 1 tablet (100 mg total) by mouth daily.  90 tablet  1  . clonazePAM (KLONOPIN) 2 MG tablet Take 2 mg by mouth 2 (two) times daily. Take 1 tablet at breakfast. Take 1 tablet at lunch.      . colchicine 0.6 MG tablet       . DiphenhydrAMINE HCl, Sleep, ([facility] SLEEP AID NIGHTTIME PO) Take 2 tablets by mouth at bedtime.      . enalapril (VASOTEC) 20 MG tablet TAKE ONE  TABLET BY MOUTH EVERY DAY FOR BLOOD PRESSURE  90 tablet  1  . FARXIGA 10 MG TABS TAKE 1 TABLET BY MOUTH EVERY MORNING FOR DIABETES  30 tablet  3  . fenofibrate micronized (LOFIBRA) 134 MG capsule Take 1 capsule (134 mg total) by mouth daily.  90 capsule  1  . fluconazole (DIFLUCAN) 150 MG tablet Take 1 tablet weekly for yeast infection  4 tablet  99  . FLUoxetine (PROZAC) 40 MG capsule Take 1 capsule (40 mg total) by mouth daily.  90 capsule  1  . fluticasone (FLONASE) 50 MCG/ACT nasal spray Place 2 sprays into both nostrils daily.  16 g  99  . gabapentin (NEURONTIN) 100 MG capsule Take 1 capsule (100 mg total) by mouth 2 (two) times  daily.  180 capsule  1  . gabapentin (NEURONTIN) 300 MG capsule Take 1 capsule (300 mg total) by mouth every evening.  90 capsule  1  . hydrochlorothiazide (HYDRODIURIL) 25 MG tablet Take 1 tablet (25 mg total) by mouth every other day.  90 tablet  1  . levothyroxine (SYNTHROID, LEVOTHROID) 100 MCG tablet TAKE 1 TABLET BY MOUTH EVERY DAY  90 tablet  2  . metFORMIN (GLUCOPHAGE) 1000 MG tablet Take 1 tablet (1,000 mg total) by mouth 2 (two) times daily with a meal. 1 tablet twice daily  180 tablet  1  . NOVOLIN N 100 UNIT/ML injection INJECT 50 UNITS EVERY MORNING AND 25 UNITS EVERY EVENING  30 mL  2  . nystatin (MYCOSTATIN/NYSTOP) 100000 UNIT/GM POWD One application of powder twice daily as needed  60 g  3  . omeprazole (PRILOSEC) 40 MG capsule Take 1 capsule (40 mg total) by mouth every morning. For acid reflux and indigestion  90 capsule  99  . albuterol (PROVENTIL HFA;VENTOLIN HFA) 108 (90 BASE) MCG/ACT inhaler Inhale 1-2 puffs into the lungs every 6 (six) hours as needed for wheezing. Dispense with aerochamber  1 Inhaler  0   No current facility-administered medications on file prior to visit.   Medical History:  Past Medical History  Diagnosis Date  . Diabetes mellitus   . Asthma   . Hypertension   . Hypercholesteremia   . Gout LEFT FOOT-  STABLE  . Neuropathy of lower extremity   . Bipolar 1 disorder   . OCD (obsessive compulsive disorder)   . Depression   . SOB (shortness of breath) on exertion   . GERD (gastroesophageal reflux disease)   . H/O hiatal hernia   . SUI (stress urinary incontinence, female)   . Arthritis LOWER BACK  . History of CVA (cerebrovascular accident) FOUND PER MRI 1994--  RESIDUAL MEMORY IMPAIRED  . Vulvar lesion   . Anxiety disorder   . Carpal tunnel syndrome   . Peripheral neuropathy   . Dyslipidemia   . Gait disorder    Allergies:  Allergies  Allergen Reactions  . Sulfa Antibiotics Other (See Comments)    Pt states she had "extreme pain"  .  Sulfacetamide Sodium      Review of Systems: [X]  = complains of  [ ]  = denies  General: Fatigue Valu.Nieves ] Fever [ ]  Chills [ ]  Weakness [ ]   Insomnia [ ]  Eyes: Redness [ ]  Blurred vision [ ]  Diplopia [ ]   ENT: Congestion [ ]  Sinus Pain [ ]  Post Nasal Drip [ ]  Sore Throat [ ]  Earache [ ]   Cardiac: Chest pain/pressure [ ]  SOB [ ]  Orthopnea [ ]   Palpitations [ ]   Paroxysmal nocturnal dyspnea[ ]  Claudication [ ]  Edema [ ]   Pulmonary: Cough Valu.Nieves ] Wheezing[ ]   SOB [ ]   Snoring [ ]   GI: Nausea [ ]  Vomiting[ ]  Dysphagia[ ]  Heartburn[ ]  Abdominal pain [ ]  Constipation [ ] ; Diarrhea [ ] ; BRBPR [ ]  Melena[ ]  GU: Hematuria[ ]  Dysuria [ ]  Nocturia[ ]  Urgency [ ]   Hesitancy [ ]  Discharge [ ]  Neuro: Headaches[ ]  Vertigo[ ]  Paresthesias[ ]  Spasm [ ]  Speech changes [ ]  Incoordination [ ]   Ortho: Arthritis Valu.Nieves ] Joint pain Valu.Nieves ] Muscle pain [ ]  Joint swelling LEFT FOOT Valu.Nieves ] Back Pain [ ]  Skin:  Rash Valu.Nieves ]  Pruritis [ ]  Change in skin lesion Valu.Nieves ]  Psych: Depression[ ]  Anxiety[ ]  Confusion [ ]  Memory loss [ ]   Heme/Lypmh: Bleeding [ ]  Bruising [ ]  Enlarged lymph nodes [ ]   Endocrine: Visual blurring [ ]  Paresthesia [ ]  Polyuria [ ]  Polydypsea [ ]    Heat/cold intolerance [ ]  Hypoglycemia [ ]   Family history- Review and unchanged Social history- Review and unchanged Physical Exam: BP 120/78  Pulse 76  Temp(Src) 97.9 F (36.6 C)  Resp 16  Ht 5' 3.5" (1.613 m)  Wt 185 lb (83.915 kg)  BMI 32.25 kg/m2 Wt Readings from Last 3 Encounters:  03/28/14 185 lb (83.915 kg)  01/10/14 184 lb 6.4 oz (83.643 kg)  12/01/13 184 lb 12.8 oz (83.825 kg)   General Appearance: Obese, in no apparent distress. Eyes: PERRLA, EOMs, conjunctiva no swelling or erythema Sinuses: No Frontal/maxillary tenderness ENT/Mouth: Ext aud canals clear, TMs without erythema, bulging. No erythema, swelling, or exudate on post pharynx.  Tonsils not swollen or erythematous. Hearing normal.  Neck: Supple, thyroid normal.  Respiratory: Respiratory  effort normal, BS equal bilaterally without rales, rhonchi, wheezing or stridor.  Cardio: RRR with no MRGs. Brisk peripheral pulses with 1+ edema Abdomen: Soft, + BS.  Non tender, no guarding, rebound, hernias, masses. Lymphatics: Non tender without lymphadenopathy.  Musculoskeletal: Full ROM, 4/5 strength, normal antalgic with cane Skin: Warm, dry without rashes, lesions, ecchymosis. Her left top of her foot is erythematous, warm, mild swelling. Some pain in her 1st and 2nd MTP of the same foot. Eczema on scalp, dry scaly patches/nodules.  Neuro: Cranial nerves intact. No cerebellar symptoms. Decreased sensation bilateral feet.  Psych: Awake and oriented X 3, normal affect, Insight and Judgment appropriate.    Vicie Mutters 5:01 PM

## 2014-03-28 NOTE — Patient Instructions (Addendum)
Use erythromycin gel on your ear and on your arms Can use SOLUTION on your hair  Stop the enalapril because of the cough and I'm calling in Losartan 50mg  daily  We want weight loss that will last so you should lose 1-2 pounds a week.  THAT IS IT! Please pick THREE things a month to change. Once it is a habit check off the item. Then pick another three items off the list to become habits.  If you are already doing a habit on the list GREAT!  Cross that item off! o Don't drink your calories. Ie, alcohol, soda, fruit juice, and sweet tea.  o Drink more water. Drink a glass when you feel hungry or before each meal.  o Eat breakfast - Complex carb and protein (likeDannon light and fit yogurt, oatmeal, fruit, eggs, [place] bacon). o Measure your cereal.  Eat no more than one cup a day. (ie [place]) o Eat an apple a day. o Add a vegetable a day. o Try a new vegetable a month. o Use Pam! Stop using oil or butter to cook. o Don't finish your plate or use smaller plates. o Share your dessert. o Eat sugar free Jello for dessert or frozen grapes. o Don't eat 2-3 hours before bed. o Switch to whole wheat bread, pasta, and brown rice. o Make healthier choices when you eat out. No fries! o Pick baked chicken, NOT fried. o Don't forget to SLOW DOWN when you eat. It is not going anywhere.  o Take the stairs. o Park far away in the parking lot o [employer] (or weights) for 10 minutes while watching TV. o Walk at work for 10 minutes during break. o Walk outside 1 time a week with your friend, kids, dog, or significant other. o Start a walking group at [place] the mall as much as you can tolerate.  o Keep a food diary. o Weigh yourself daily. o Walk for 15 minutes 3 days per week. o Cook at home more often and eat out less.  If life happens and you go back to old habits, it is okay.  Just start over. You can do it!   If you experience chest pain, get short of breath, or tired during the  exercise, please stop immediately and inform your doctor.     Bad carbs also include fruit juice, alcohol, and sweet tea. These are empty calories that do not signal to your brain that you are full.   Please remember the good carbs are still carbs which convert into sugar. So please measure them out no more than 1/2-1 cup of rice, oatmeal, pasta, and beans.  Veggies are however free foods! Pile them on.   I like lean protein at every meal such as chicken, [place], pork chops, cottage cheese, etc. Just do not fry these meats and please center your meal around vegetable, the meats should be a side dish.   No all fruit is created equal. Please see the list below, the fruit at the bottom is higher in sugars than the fruit at the top

## 2014-03-29 LAB — TSH: TSH: 1.598 u[IU]/mL (ref 0.350–4.500)

## 2014-03-29 LAB — HEMOGLOBIN A1C
Hgb A1c MFr Bld: 12 % — ABNORMAL HIGH (ref <5.7)
Mean Plasma Glucose: 298 mg/dL — ABNORMAL HIGH (ref <117)

## 2014-03-29 LAB — VITAMIN D 25 HYDROXY (VIT D DEFICIENCY, FRACTURES): Vit D, 25-Hydroxy: 18 ng/mL — ABNORMAL LOW (ref 30–89)

## 2014-04-11 ENCOUNTER — Other Ambulatory Visit: Payer: Self-pay

## 2014-04-11 MED ORDER — FLUOXETINE HCL 40 MG PO CAPS: 40.0000 mg | ORAL_CAPSULE | Freq: Every day | ORAL | Status: DC

## 2014-05-02 ENCOUNTER — Ambulatory Visit (INDEPENDENT_AMBULATORY_CARE_PROVIDER_SITE_OTHER): Payer: Commercial Managed Care - HMO

## 2014-05-02 VITALS — BP 128/78 | HR 72 | Temp 97.7°F | Resp 17 | Ht 63.5 in | Wt 184.0 lb

## 2014-05-02 DIAGNOSIS — R6889 Other general symptoms and signs: Secondary | ICD-10-CM

## 2014-05-02 LAB — BASIC METABOLIC PANEL WITH GFR
BUN: 19 mg/dL (ref 6–23)
CO2: 27 mEq/L (ref 19–32)
Calcium: 10.2 mg/dL (ref 8.4–10.5)
Chloride: 95 mEq/L — ABNORMAL LOW (ref 96–112)
Creat: 1.01 mg/dL (ref 0.50–1.10)
GFR, Est African American: 67 mL/min
GFR, Est Non African American: 58 mL/min — ABNORMAL LOW
Glucose, Bld: 208 mg/dL — ABNORMAL HIGH (ref 70–99)
Potassium: 3.5 mEq/L (ref 3.5–5.3)
Sodium: 135 mEq/L (ref 135–145)

## 2014-05-02 NOTE — Progress Notes (Signed)
Patient ID: Ashley [name], female   DOB: 02-01-1946, 68 y.o.   MRN: [id number] Patient here today to repeat BMP.

## 2014-05-03 ENCOUNTER — Other Ambulatory Visit: Payer: Self-pay | Admitting: Internal Medicine

## 2014-05-08 ENCOUNTER — Other Ambulatory Visit: Payer: Self-pay | Admitting: Physician Assistant

## 2014-05-08 DIAGNOSIS — E109 Type 1 diabetes mellitus without complications: Secondary | ICD-10-CM

## 2014-05-22 ENCOUNTER — Other Ambulatory Visit: Payer: Self-pay | Admitting: Internal Medicine

## 2014-05-22 ENCOUNTER — Other Ambulatory Visit: Payer: Self-pay | Admitting: *Deleted

## 2014-05-22 MED ORDER — ALLOPURINOL 300 MG PO TABS: 300.0000 mg | ORAL_TABLET | Freq: Every morning | ORAL | Status: DC

## 2014-05-22 MED ORDER — ATORVASTATIN CALCIUM 40 MG PO TABS: 40.0000 mg | ORAL_TABLET | Freq: Every day | ORAL | Status: DC

## 2014-05-24 ENCOUNTER — Ambulatory Visit (INDEPENDENT_AMBULATORY_CARE_PROVIDER_SITE_OTHER): Payer: Commercial Managed Care - HMO | Admitting: Physician Assistant

## 2014-05-24 ENCOUNTER — Encounter: Payer: Self-pay | Admitting: Physician Assistant

## 2014-05-24 VITALS — BP 122/78 | HR 96 | Temp 97.9°F | Resp 16 | Ht 63.5 in | Wt 175.0 lb

## 2014-05-24 DIAGNOSIS — R110 Nausea: Secondary | ICD-10-CM

## 2014-05-24 DIAGNOSIS — R350 Frequency of micturition: Secondary | ICD-10-CM

## 2014-05-24 DIAGNOSIS — IMO0001 Reserved for inherently not codable concepts without codable children: Secondary | ICD-10-CM

## 2014-05-24 DIAGNOSIS — R1031 Right lower quadrant pain: Secondary | ICD-10-CM

## 2014-05-24 MED ORDER — CLONAZEPAM 2 MG PO TABS: 2.0000 mg | ORAL_TABLET | Freq: Two times a day (BID) | ORAL | Status: DC

## 2014-05-24 MED ORDER — PROMETHAZINE HCL 25 MG PO TABS: ORAL_TABLET | ORAL | Status: DC

## 2014-05-24 NOTE — Patient Instructions (Signed)

## 2014-05-24 NOTE — Progress Notes (Signed)
   Subjective:    Patient ID: Ashley [name], female    DOB: [birth year], 68 y.o.   MRN: [id number]  HPI 68 y.o. female presents with right side pain. She states on 05/12/2014 she was bringing a box up the stairs and pulled a muscle on her left side, this pain got better but then she started to have stomach pain, nausea, some right lower quadrant pain/flank pain, has chronic constipation, some increased sweating, weakness, denies vomiting, chills. She has been out of her klonopin for 2 weeks, due to not following up with Dr. [name].   Blood pressure 122/78, pulse 96, temperature 97.9 F (36.6 C), resp. rate 16, height 5' 3.5" (1.613 m), weight 175 lb (79.379 kg).   Wt Readings from Last 3 Encounters:  05/24/14 175 lb (79.379 kg)  05/02/14 184 lb (83.462 kg)  03/28/14 185 lb (83.915 kg)     Review of Systems  Constitutional: Positive for chills, diaphoresis and appetite change. Negative for fever, activity change, fatigue and unexpected weight change.  HENT: Negative.   Eyes: Negative.   Respiratory: Negative.   Cardiovascular: Negative.   Gastrointestinal: Positive for nausea, abdominal pain and constipation. Negative for vomiting, diarrhea, blood in stool, abdominal distention, anal bleeding and rectal pain.  Genitourinary: Positive for frequency and flank pain. Negative for dysuria, urgency, hematuria, decreased urine volume, vaginal bleeding, vaginal discharge, enuresis, difficulty urinating, genital sores, vaginal pain, menstrual problem, pelvic pain and dyspareunia.  Musculoskeletal: Negative.   Skin: Negative.  Negative for rash.  Neurological: Negative.   Psychiatric/Behavioral: Positive for sleep disturbance and decreased concentration. Negative for suicidal ideas, hallucinations, behavioral problems, confusion, self-injury, dysphoric mood and agitation. The patient is nervous/anxious. The patient is not hyperactive.        Objective:   Physical Exam  Vitals  reviewed. Constitutional: She is oriented to person, place, and time. She appears well-developed and well-nourished.  Neck: Normal range of motion. Neck supple.  Cardiovascular: Normal rate and regular rhythm.   Pulmonary/Chest: Effort normal and breath sounds normal.  Abdominal: Soft. Bowel sounds are normal. She exhibits no mass. There is tenderness in the right lower quadrant. There is no rigidity, no rebound and no guarding.  obese  Musculoskeletal:  Walks with walker  Neurological: She is alert and oriented to person, place, and time.  Skin: Skin is warm and dry. No rash noted.       Assessment & Plan:  Generalized pain, worse RLQ, with nausea sweating, tremor, no rebound, no peritoneal signs.  Treat out patient, get labs-? If from withdrawal from klonopin, right time frame.  Patient is unable to get in with her psych Dr. [name] until Nov 12, will send in klonopin until she is able to get in with her psych.  She understands that we will not continously refill this and this is temporary. We will send this note to her doctor.   liquid diet/bland diet, continue fluids, PPI sample If worse pain please go to the ER

## 2014-05-25 LAB — BASIC METABOLIC PANEL WITH GFR
BUN: 29 mg/dL — ABNORMAL HIGH (ref 6–23)
CO2: 25 mEq/L (ref 19–32)
Calcium: 10.8 mg/dL — ABNORMAL HIGH (ref 8.4–10.5)
Chloride: 92 mEq/L — ABNORMAL LOW (ref 96–112)
Creat: 1.42 mg/dL — ABNORMAL HIGH (ref 0.50–1.10)
GFR, Est African American: 44 mL/min — ABNORMAL LOW
GFR, Est Non African American: 38 mL/min — ABNORMAL LOW
Glucose, Bld: 189 mg/dL — ABNORMAL HIGH (ref 70–99)
Potassium: 4.2 mEq/L (ref 3.5–5.3)
Sodium: 135 mEq/L (ref 135–145)

## 2014-05-25 LAB — CBC WITH DIFFERENTIAL/PLATELET
Basophils Absolute: 0 10*3/uL (ref 0.0–0.1)
Basophils Relative: 0 % (ref 0–1)
Eosinophils Absolute: 0.2 10*3/uL (ref 0.0–0.7)
Eosinophils Relative: 2 % (ref 0–5)
HCT: 46.8 % — ABNORMAL HIGH (ref 36.0–46.0)
Hemoglobin: 16 g/dL — ABNORMAL HIGH (ref 12.0–15.0)
Lymphocytes Relative: 24 % (ref 12–46)
Lymphs Abs: 3 10*3/uL (ref 0.7–4.0)
MCH: 30.5 pg (ref 26.0–34.0)
MCHC: 34.2 g/dL (ref 30.0–36.0)
MCV: 89.3 fL (ref 78.0–100.0)
Monocytes Absolute: 0.9 10*3/uL (ref 0.1–1.0)
Monocytes Relative: 7 % (ref 3–12)
Neutro Abs: 8.2 10*3/uL — ABNORMAL HIGH (ref 1.7–7.7)
Neutrophils Relative %: 67 % (ref 43–77)
Platelets: 418 10*3/uL — ABNORMAL HIGH (ref 150–400)
RBC: 5.24 MIL/uL — ABNORMAL HIGH (ref 3.87–5.11)
RDW: 14.2 % (ref 11.5–15.5)
WBC: 12.3 10*3/uL — ABNORMAL HIGH (ref 4.0–10.5)

## 2014-05-25 LAB — HEPATIC FUNCTION PANEL
ALT: 20 U/L (ref 0–35)
AST: 15 U/L (ref 0–37)
Albumin: 4.8 g/dL (ref 3.5–5.2)
Alkaline Phosphatase: 54 U/L (ref 39–117)
Bilirubin, Direct: 0.1 mg/dL (ref 0.0–0.3)
Indirect Bilirubin: 0.3 mg/dL (ref 0.2–1.2)
Total Bilirubin: 0.4 mg/dL (ref 0.2–1.2)
Total Protein: 7.8 g/dL (ref 6.0–8.3)

## 2014-05-26 LAB — URINALYSIS, ROUTINE W REFLEX MICROSCOPIC
Glucose, UA: >1000 mg/dL — AB
Hgb urine dipstick: NEGATIVE
Ketones, ur: NEGATIVE mg/dL
Nitrite: NEGATIVE
Protein, ur: NEGATIVE mg/dL
Specific Gravity, Urine: >1.03 — ABNORMAL HIGH (ref 1.005–1.030)
Urobilinogen, UA: 0.2 mg/dL (ref 0.0–1.0)
pH: 5 (ref 5.0–8.0)

## 2014-05-26 LAB — URINE CULTURE: Colony Count: >=100000

## 2014-05-26 LAB — URINALYSIS, MICROSCOPIC ONLY
Bacteria, UA: NONE SEEN
Casts: NONE SEEN
Crystals: NONE SEEN
Squamous Epithelial / LPF: NONE SEEN

## 2014-05-26 MED ORDER — CIPROFLOXACIN HCL 500 MG PO TABS: 500.0000 mg | ORAL_TABLET | Freq: Two times a day (BID) | ORAL | Status: AC

## 2014-05-26 NOTE — Addendum Note (Signed)
Addended by: Vicie Mutters R on: 05/26/2014 11:09 AM   Modules accepted: Orders

## 2014-05-29 ENCOUNTER — Telehealth: Payer: Self-pay

## 2014-05-29 NOTE — Telephone Encounter (Signed)
Patient made aware of lab results and CT ab/pelvis scheduled at [facility] , 06-01-14 @ 1:10, patient advised to pick up contrast material before Wednesday, she is aware scheduled at [address] location, patient given all appointment details and phone number to [place]

## 2014-05-30 ENCOUNTER — Other Ambulatory Visit: Payer: Self-pay

## 2014-05-30 MED ORDER — LOSARTAN POTASSIUM 50 MG PO TABS: 50.0000 mg | ORAL_TABLET | Freq: Two times a day (BID) | ORAL | Status: DC

## 2014-06-01 ENCOUNTER — Ambulatory Visit
Admission: RE | Admit: 2014-06-01 | Discharge: 2014-06-01 | Disposition: A | Discharge status: Home / Self Care | Payer: Commercial Managed Care - HMO | Source: Ambulatory Visit | Attending: Physician Assistant | Admitting: Physician Assistant

## 2014-06-01 ENCOUNTER — Ambulatory Visit (INDEPENDENT_AMBULATORY_CARE_PROVIDER_SITE_OTHER): Payer: Commercial Managed Care - HMO | Admitting: *Deleted

## 2014-06-01 ENCOUNTER — Other Ambulatory Visit: Payer: Self-pay | Admitting: Physician Assistant

## 2014-06-01 DIAGNOSIS — N3000 Acute cystitis without hematuria: Secondary | ICD-10-CM

## 2014-06-01 DIAGNOSIS — N179 Acute kidney failure, unspecified: Secondary | ICD-10-CM

## 2014-06-01 DIAGNOSIS — R1031 Right lower quadrant pain: Secondary | ICD-10-CM

## 2014-06-01 DIAGNOSIS — IMO0001 Reserved for inherently not codable concepts without codable children: Secondary | ICD-10-CM

## 2014-06-01 LAB — CBC WITH DIFFERENTIAL/PLATELET
Basophils Absolute: 0.1 10*3/uL (ref 0.0–0.1)
Basophils Relative: 1 % (ref 0–1)
Eosinophils Absolute: 0.3 10*3/uL (ref 0.0–0.7)
Eosinophils Relative: 3 % (ref 0–5)
HCT: 42.2 % (ref 36.0–46.0)
Hemoglobin: 13.6 g/dL (ref 12.0–15.0)
Lymphocytes Relative: 30 % (ref 12–46)
Lymphs Abs: 3.2 10*3/uL (ref 0.7–4.0)
MCH: 29.5 pg (ref 26.0–34.0)
MCHC: 32.2 g/dL (ref 30.0–36.0)
MCV: 91.5 fL (ref 78.0–100.0)
Monocytes Absolute: 0.6 10*3/uL (ref 0.1–1.0)
Monocytes Relative: 6 % (ref 3–12)
Neutro Abs: 6.5 10*3/uL (ref 1.7–7.7)
Neutrophils Relative %: 60 % (ref 43–77)
Platelets: 338 10*3/uL (ref 150–400)
RBC: 4.61 MIL/uL (ref 3.87–5.11)
RDW: 14.1 % (ref 11.5–15.5)
WBC: 10.8 10*3/uL — ABNORMAL HIGH (ref 4.0–10.5)

## 2014-06-02 ENCOUNTER — Ambulatory Visit: Payer: Self-pay

## 2014-06-02 LAB — URINALYSIS, ROUTINE W REFLEX MICROSCOPIC
Bilirubin Urine: NEGATIVE
Glucose, UA: >1000 mg/dL — AB
Hgb urine dipstick: NEGATIVE
Ketones, ur: NEGATIVE mg/dL
Nitrite: NEGATIVE
Protein, ur: NEGATIVE mg/dL
Specific Gravity, Urine: 1.028 (ref 1.005–1.030)
Urobilinogen, UA: 0.2 mg/dL (ref 0.0–1.0)
pH: 5 (ref 5.0–8.0)

## 2014-06-02 LAB — URINALYSIS, MICROSCOPIC ONLY
Bacteria, UA: NONE SEEN
Casts: NONE SEEN
Crystals: NONE SEEN

## 2014-06-02 LAB — BASIC METABOLIC PANEL WITH GFR
BUN: 24 mg/dL — ABNORMAL HIGH (ref 6–23)
CO2: 24 mEq/L (ref 19–32)
Calcium: 9.6 mg/dL (ref 8.4–10.5)
Chloride: 98 mEq/L (ref 96–112)
Creat: 1.25 mg/dL — ABNORMAL HIGH (ref 0.50–1.10)
GFR, Est African American: 51 mL/min — ABNORMAL LOW
GFR, Est Non African American: 45 mL/min — ABNORMAL LOW
Glucose, Bld: 239 mg/dL — ABNORMAL HIGH (ref 70–99)
Potassium: 4 mEq/L (ref 3.5–5.3)
Sodium: 134 mEq/L — ABNORMAL LOW (ref 135–145)

## 2014-06-02 LAB — URINE CULTURE: Colony Count: 30000

## 2014-06-27 ENCOUNTER — Encounter (HOSPITAL_COMMUNITY): Payer: Self-pay | Admitting: Emergency Medicine

## 2014-06-27 ENCOUNTER — Emergency Department (HOSPITAL_COMMUNITY)
Admission: EM | Admit: 2014-06-27 | Discharge: 2014-06-28 | Disposition: A | Discharge status: Home / Self Care | Payer: Medicare HMO | Attending: Emergency Medicine | Admitting: Emergency Medicine

## 2014-06-27 DIAGNOSIS — J45909 Unspecified asthma, uncomplicated: Secondary | ICD-10-CM | POA: Insufficient documentation

## 2014-06-27 DIAGNOSIS — Z720 Tobacco use: Secondary | ICD-10-CM | POA: Diagnosis not present

## 2014-06-27 DIAGNOSIS — Y998 Other external cause status: Secondary | ICD-10-CM | POA: Diagnosis not present

## 2014-06-27 DIAGNOSIS — Z79899 Other long term (current) drug therapy: Secondary | ICD-10-CM | POA: Insufficient documentation

## 2014-06-27 DIAGNOSIS — Z8742 Personal history of other diseases of the female genital tract: Secondary | ICD-10-CM | POA: Insufficient documentation

## 2014-06-27 DIAGNOSIS — E119 Type 2 diabetes mellitus without complications: Secondary | ICD-10-CM | POA: Diagnosis not present

## 2014-06-27 DIAGNOSIS — F419 Anxiety disorder, unspecified: Secondary | ICD-10-CM | POA: Diagnosis not present

## 2014-06-27 DIAGNOSIS — E780 Pure hypercholesterolemia: Secondary | ICD-10-CM | POA: Insufficient documentation

## 2014-06-27 DIAGNOSIS — S60862A Insect bite (nonvenomous) of left wrist, initial encounter: Secondary | ICD-10-CM | POA: Diagnosis not present

## 2014-06-27 DIAGNOSIS — Z7952 Long term (current) use of systemic steroids: Secondary | ICD-10-CM | POA: Diagnosis not present

## 2014-06-27 DIAGNOSIS — G629 Polyneuropathy, unspecified: Secondary | ICD-10-CM | POA: Insufficient documentation

## 2014-06-27 DIAGNOSIS — M109 Gout, unspecified: Secondary | ICD-10-CM | POA: Insufficient documentation

## 2014-06-27 DIAGNOSIS — Z8673 Personal history of transient ischemic attack (TIA), and cerebral infarction without residual deficits: Secondary | ICD-10-CM | POA: Insufficient documentation

## 2014-06-27 DIAGNOSIS — I10 Essential (primary) hypertension: Secondary | ICD-10-CM | POA: Diagnosis not present

## 2014-06-27 DIAGNOSIS — W57XXXA Bitten or stung by nonvenomous insect and other nonvenomous arthropods, initial encounter: Secondary | ICD-10-CM | POA: Insufficient documentation

## 2014-06-27 DIAGNOSIS — F319 Bipolar disorder, unspecified: Secondary | ICD-10-CM | POA: Insufficient documentation

## 2014-06-27 DIAGNOSIS — Z7951 Long term (current) use of inhaled steroids: Secondary | ICD-10-CM | POA: Insufficient documentation

## 2014-06-27 DIAGNOSIS — M1990 Unspecified osteoarthritis, unspecified site: Secondary | ICD-10-CM | POA: Diagnosis not present

## 2014-06-27 DIAGNOSIS — Y939 Activity, unspecified: Secondary | ICD-10-CM | POA: Diagnosis not present

## 2014-06-27 DIAGNOSIS — M545 Low back pain, unspecified: Secondary | ICD-10-CM

## 2014-06-27 DIAGNOSIS — K219 Gastro-esophageal reflux disease without esophagitis: Secondary | ICD-10-CM | POA: Diagnosis not present

## 2014-06-27 DIAGNOSIS — Y929 Unspecified place or not applicable: Secondary | ICD-10-CM | POA: Insufficient documentation

## 2014-06-27 DIAGNOSIS — E785 Hyperlipidemia, unspecified: Secondary | ICD-10-CM | POA: Diagnosis not present

## 2014-06-27 NOTE — ED Notes (Signed)
Pt st's she was bitten by insect on left forearm earlier today.  C/O severe pain to area. (small red area noted)  Pt also st's she pulled a muscle in left lower back 2-3 days ago and can't sleep due to pain
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
## 2014-06-28 ENCOUNTER — Telehealth: Payer: Self-pay | Admitting: Gynecologic Oncology

## 2014-06-28 MED ORDER — HYDROCORTISONE 1 % EX CREA: TOPICAL_CREAM | CUTANEOUS | Status: DC

## 2014-06-28 MED ORDER — HYDROCODONE-ACETAMINOPHEN 5-325 MG PO TABS: 1.0000 | ORAL_TABLET | Freq: Four times a day (QID) | ORAL | Status: DC | PRN

## 2014-06-28 NOTE — ED Notes (Signed)
Pt. Refused wheelchair

## 2014-06-28 NOTE — Discharge Instructions (Signed)
Back Pain, Adult Low back pain is very common. About 1 in 5 people have back pain.The cause of low back pain is rarely dangerous. The pain often gets better over time.About half of people with a sudden onset of back pain feel better in just 2 weeks. About 8 in 10 people feel better by 6 weeks.  CAUSES Some common causes of back pain include:  Strain of the muscles or ligaments supporting the spine.  Wear and tear (degeneration) of the spinal discs.  Arthritis.  Direct injury to the back. DIAGNOSIS Most of the time, the direct cause of low back pain is not known.However, back pain can be treated effectively even when the exact cause of the pain is unknown.Answering your caregiver's questions about your overall health and symptoms is one of the most accurate ways to make sure the cause of your pain is not dangerous. If your caregiver needs more information, he or she may order lab work or imaging tests (X-rays or MRIs).However, even if imaging tests show changes in your back, this usually does not require surgery. HOME CARE INSTRUCTIONS For many people, back pain returns.Since low back pain is rarely dangerous, it is often a condition that people can learn to [facility] their own.   Remain active. It is stressful on the back to sit or stand in one place. Do not sit, drive, or stand in one place for more than 30 minutes at a time. Take short walks on level surfaces as soon as pain allows.Try to increase the length of time you walk each day.  Do not stay in bed.Resting more than 1 or 2 days can delay your recovery.  Do not avoid exercise or work.Your body is made to move.It is not dangerous to be active, even though your back may hurt.Your back will likely heal faster if you return to being active before your pain is gone.  Pay attention to your body when you bend and lift. Many people have less discomfortwhen lifting if they bend their knees, keep the load close to their bodies,and  avoid twisting. Often, the most comfortable positions are those that put less stress on your recovering back.  Find a comfortable position to sleep. Use a firm mattress and lie on your side with your knees slightly bent. If you lie on your back, put a pillow under your knees.  Only take over-the-counter or prescription medicines as directed by your caregiver. Over-the-counter medicines to reduce pain and inflammation are often the most helpful.Your caregiver may prescribe muscle relaxant drugs.These medicines help dull your pain so you can more quickly return to your normal activities and healthy exercise.  Put ice on the injured area.  Put ice in a plastic bag.  Place a towel between your skin and the bag.  Leave the ice on for 15-20 minutes, 03-04 times a day for the first 2 to 3 days. After that, ice and heat may be alternated to reduce pain and spasms.  Ask your caregiver about trying back exercises and gentle massage. This may be of some benefit.  Avoid feeling anxious or stressed.Stress increases muscle tension and can worsen back pain.It is important to recognize when you are anxious or stressed and learn ways to manage it.Exercise is a great option. SEEK MEDICAL CARE IF:  You have pain that is not relieved with rest or medicine.  You have pain that does not improve in 1 week.  You have new symptoms.  You are generally not feeling well. SEEK  IMMEDIATE MEDICAL CARE IF:   You have pain that radiates from your back into your legs.  You develop new bowel or bladder control problems.  You have unusual weakness or numbness in your arms or legs.  You develop nausea or vomiting.  You develop abdominal pain.  You feel faint. Document Released: 08/04/2005 Document Revised: 02/03/2012 Document Reviewed: 12/06/2013 [facility] Patient Information 2015 [place], [place]. This information is not intended to replace advice given to you by your health care provider. Make sure you  discuss any questions you have with your health care provider.  Insect Bite Mosquitoes, flies, fleas, bedbugs, and many other insects can bite. Insect bites are different from insect stings. A sting is when venom is injected into the skin. Some insect bites can transmit infectious diseases. SYMPTOMS  Insect bites usually turn red, swell, and itch for 2 to 4 days. They often go away on their own. TREATMENT  Your caregiver may prescribe antibiotic medicines if a bacterial infection develops in the bite. HOME CARE INSTRUCTIONS  Do not scratch the bite area.  Keep the bite area clean and dry. Wash the bite area thoroughly with soap and water.  Put ice or cool compresses on the bite area.  Put ice in a plastic bag.  Place a towel between your skin and the bag.  Leave the ice on for 20 minutes, 4 times a day for the first 2 to 3 days, or as directed.  You may apply a baking soda paste, cortisone cream, or calamine lotion to the bite area as directed by your caregiver. This can help reduce itching and swelling.  Only take over-the-counter or prescription medicines as directed by your caregiver.  If you are given antibiotics, take them as directed. Finish them even if you start to feel better. You may need a tetanus shot if:  You cannot remember when you had your last tetanus shot.  You have never had a tetanus shot.  The injury broke your skin. If you get a tetanus shot, your arm may swell, get red, and feel warm to the touch. This is common and not a problem. If you need a tetanus shot and you choose not to have one, there is a rare chance of getting tetanus. Sickness from tetanus can be serious. SEEK IMMEDIATE MEDICAL CARE IF:   You have increased pain, redness, or swelling in the bite area.  You see a red line on the skin coming from the bite.  You have a fever.  You have joint pain.  You have a headache or neck pain.  You have unusual weakness.  You have a rash.  You  have chest pain or shortness of breath.  You have abdominal pain, nausea, or vomiting.  You feel unusually tired or sleepy. MAKE SURE YOU:   Understand these instructions.  Will watch your condition.  Will get help right away if you are not doing well or get worse. Document Released: 09/11/2004 Document Revised: 10/27/2011 Document Reviewed: 03/05/2011 [facility] Patient Information 2015 [place], [place]. This information is not intended to replace advice given to you by your health care provider. Make sure you discuss any questions you have with your health care provider.
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
## 2014-06-29 ENCOUNTER — Telehealth: Payer: Self-pay | Admitting: *Deleted

## 2014-06-29 ENCOUNTER — Ambulatory Visit: Payer: Commercial Managed Care - HMO | Admitting: Gynecologic Oncology

## 2014-06-29 ENCOUNTER — Encounter: Payer: Self-pay | Admitting: Internal Medicine

## 2014-06-29 NOTE — Telephone Encounter (Signed)
Pt called advised she needed to r/s appt for today as she was bitten by a bug on her arm and needs to see her pcp to get it looked at. Appt r/s for Nov 30. Pt confirmed,no further concerns.

## 2014-07-17 ENCOUNTER — Ambulatory Visit: Payer: Commercial Managed Care - HMO | Attending: Gynecologic Oncology | Admitting: Gynecologic Oncology

## 2014-07-17 ENCOUNTER — Encounter: Payer: Self-pay | Admitting: Gynecologic Oncology

## 2014-07-17 VITALS — BP 129/53 | HR 84 | Temp 97.7°F | Resp 20 | Ht 63.5 in | Wt 178.1 lb

## 2014-07-17 DIAGNOSIS — K5900 Constipation, unspecified: Secondary | ICD-10-CM | POA: Diagnosis not present

## 2014-07-17 DIAGNOSIS — F329 Major depressive disorder, single episode, unspecified: Secondary | ICD-10-CM | POA: Insufficient documentation

## 2014-07-17 DIAGNOSIS — N393 Stress incontinence (female) (male): Secondary | ICD-10-CM | POA: Insufficient documentation

## 2014-07-17 DIAGNOSIS — G629 Polyneuropathy, unspecified: Secondary | ICD-10-CM | POA: Diagnosis not present

## 2014-07-17 DIAGNOSIS — M1990 Unspecified osteoarthritis, unspecified site: Secondary | ICD-10-CM | POA: Insufficient documentation

## 2014-07-17 DIAGNOSIS — E780 Pure hypercholesterolemia: Secondary | ICD-10-CM | POA: Insufficient documentation

## 2014-07-17 DIAGNOSIS — K449 Diaphragmatic hernia without obstruction or gangrene: Secondary | ICD-10-CM | POA: Diagnosis not present

## 2014-07-17 DIAGNOSIS — J45909 Unspecified asthma, uncomplicated: Secondary | ICD-10-CM | POA: Insufficient documentation

## 2014-07-17 DIAGNOSIS — Z90710 Acquired absence of both cervix and uterus: Secondary | ICD-10-CM | POA: Insufficient documentation

## 2014-07-17 DIAGNOSIS — F42 Obsessive-compulsive disorder: Secondary | ICD-10-CM | POA: Insufficient documentation

## 2014-07-17 DIAGNOSIS — R0602 Shortness of breath: Secondary | ICD-10-CM | POA: Insufficient documentation

## 2014-07-17 DIAGNOSIS — Z8673 Personal history of transient ischemic attack (TIA), and cerebral infarction without residual deficits: Secondary | ICD-10-CM | POA: Insufficient documentation

## 2014-07-17 DIAGNOSIS — C519 Malignant neoplasm of vulva, unspecified: Secondary | ICD-10-CM | POA: Diagnosis present

## 2014-07-17 DIAGNOSIS — N816 Rectocele: Secondary | ICD-10-CM

## 2014-07-17 DIAGNOSIS — I10 Essential (primary) hypertension: Secondary | ICD-10-CM | POA: Diagnosis not present

## 2014-07-17 DIAGNOSIS — M109 Gout, unspecified: Secondary | ICD-10-CM | POA: Insufficient documentation

## 2014-07-17 DIAGNOSIS — K219 Gastro-esophageal reflux disease without esophagitis: Secondary | ICD-10-CM | POA: Diagnosis not present

## 2014-07-17 DIAGNOSIS — E119 Type 2 diabetes mellitus without complications: Secondary | ICD-10-CM | POA: Diagnosis not present

## 2014-07-17 DIAGNOSIS — Z8544 Personal history of malignant neoplasm of other female genital organs: Secondary | ICD-10-CM

## 2014-07-17 DIAGNOSIS — F3189 Other bipolar disorder: Secondary | ICD-10-CM | POA: Insufficient documentation
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
## 2014-07-17 NOTE — Patient Instructions (Addendum)
Take 17g Miralax daily - you can buy this over the counter. Followup in one year with [place]. Call us late summer 2016 to schedule an appointment.   Follow-up in 12 months  Constipation Constipation is when a person has fewer than three bowel movements a week, has difficulty having a bowel movement, or has stools that are dry, hard, or larger than normal. As people grow older, constipation is more common. If you try to fix constipation with medicines that make you have a bowel movement (laxatives), the problem may get worse. Long-term laxative use may cause the muscles of the colon to become weak. A low-fiber diet, not taking in enough fluids, and taking certain medicines may make constipation worse.  CAUSES   Certain medicines, such as antidepressants, pain medicine, iron supplements, antacids, and water pills.   Certain diseases, such as diabetes, irritable bowel syndrome (IBS), thyroid disease, or depression.   Not drinking enough water.   Not eating enough fiber-rich foods.   Stress or travel.   Lack of physical activity or exercise.   Ignoring the urge to have a bowel movement.   Using laxatives too much.  SIGNS AND SYMPTOMS   Having fewer than three bowel movements a week.   Straining to have a bowel movement.   Having stools that are hard, dry, or larger than normal.   Feeling full or bloated.   Pain in the lower abdomen.   Not feeling relief after having a bowel movement.  DIAGNOSIS  Your health care provider will take a medical history and perform a physical exam. Further testing may be done for severe constipation. Some tests may include:  A barium enema X-ray to examine your rectum, colon, and, sometimes, your small intestine.   A sigmoidoscopy to examine your lower colon.   A colonoscopy to examine your entire colon. TREATMENT  Treatment will depend on the severity of your constipation and what is causing it. Some dietary treatments include  drinking more fluids and eating more fiber-rich foods. Lifestyle treatments may include regular exercise. If these diet and lifestyle recommendations do not help, your health care provider may recommend taking over-the-counter laxative medicines to help you have bowel movements. Prescription medicines may be prescribed if over-the-counter medicines do not work.  HOME CARE INSTRUCTIONS   Eat foods that have a lot of fiber, such as fruits, vegetables, whole grains, and beans.  Limit foods high in fat and processed sugars, such as french fries, hamburgers, cookies, candies, and soda.   A fiber supplement may be added to your diet if you cannot get enough fiber from foods.   Drink enough fluids to keep your urine clear or pale yellow.   Exercise regularly or as directed by your health care provider.   Go to the restroom when you have the urge to go. Do not hold it.   Only take over-the-counter or prescription medicines as directed by your health care provider. Do not take other medicines for constipation without talking to your health care provider first.  [place] IF:   You have bright red blood in your stool.   Your constipation lasts for more than 4 days or gets worse.   You have abdominal or rectal pain.   You have thin, pencil-like stools.   You have unexplained weight loss. MAKE SURE YOU:   Understand these instructions.  Will watch your condition.  Will get help right away if you are not doing well or get worse. Document Released: 05/02/2004  Document Revised: 08/09/2013 Document Reviewed: 05/16/2013 [facility] Patient Information 2015 [place], [place]. This information is not intended to replace advice given to you by your health care provider. Make sure you discuss any questions you have with your health care provider.

## 2014-07-26 ENCOUNTER — Encounter: Payer: Self-pay | Admitting: Internal Medicine

## 2014-07-26 ENCOUNTER — Ambulatory Visit (INDEPENDENT_AMBULATORY_CARE_PROVIDER_SITE_OTHER): Payer: Commercial Managed Care - HMO | Admitting: Internal Medicine

## 2014-07-26 VITALS — BP 140/84 | HR 88 | Temp 97.3°F | Resp 16 | Ht 63.75 in | Wt 176.0 lb

## 2014-07-26 DIAGNOSIS — E538 Deficiency of other specified B group vitamins: Secondary | ICD-10-CM

## 2014-07-26 DIAGNOSIS — E782 Mixed hyperlipidemia: Secondary | ICD-10-CM

## 2014-07-26 DIAGNOSIS — R6889 Other general symptoms and signs: Secondary | ICD-10-CM

## 2014-07-26 DIAGNOSIS — I10 Essential (primary) hypertension: Secondary | ICD-10-CM

## 2014-07-26 DIAGNOSIS — Z1212 Encounter for screening for malignant neoplasm of rectum: Secondary | ICD-10-CM

## 2014-07-26 DIAGNOSIS — G63 Polyneuropathy in diseases classified elsewhere: Secondary | ICD-10-CM

## 2014-07-26 DIAGNOSIS — E1029 Type 1 diabetes mellitus with other diabetic kidney complication: Secondary | ICD-10-CM

## 2014-07-26 DIAGNOSIS — Z0001 Encounter for general adult medical examination with abnormal findings: Secondary | ICD-10-CM

## 2014-07-26 DIAGNOSIS — Z79899 Other long term (current) drug therapy: Secondary | ICD-10-CM

## 2014-07-26 DIAGNOSIS — Z9181 History of falling: Secondary | ICD-10-CM

## 2014-07-26 DIAGNOSIS — M7071 Other bursitis of hip, right hip: Secondary | ICD-10-CM

## 2014-07-26 DIAGNOSIS — E559 Vitamin D deficiency, unspecified: Secondary | ICD-10-CM

## 2014-07-26 DIAGNOSIS — Z1331 Encounter for screening for depression: Secondary | ICD-10-CM

## 2014-07-26 DIAGNOSIS — Z23 Encounter for immunization: Secondary | ICD-10-CM

## 2014-07-26 LAB — CBC WITH DIFFERENTIAL/PLATELET
Basophils Absolute: 0 10*3/uL (ref 0.0–0.1)
Basophils Relative: 0 % (ref 0–1)
Eosinophils Absolute: 0.2 10*3/uL (ref 0.0–0.7)
Eosinophils Relative: 2 % (ref 0–5)
HCT: 44.8 % (ref 36.0–46.0)
Hemoglobin: 14.6 g/dL (ref 12.0–15.0)
Lymphocytes Relative: 23 % (ref 12–46)
Lymphs Abs: 2.3 10*3/uL (ref 0.7–4.0)
MCH: 29.5 pg (ref 26.0–34.0)
MCHC: 32.6 g/dL (ref 30.0–36.0)
MCV: 90.5 fL (ref 78.0–100.0)
MPV: 10.2 fL (ref 9.4–12.4)
Monocytes Absolute: 0.5 10*3/uL (ref 0.1–1.0)
Monocytes Relative: 5 % (ref 3–12)
Neutro Abs: 7.1 10*3/uL (ref 1.7–7.7)
Neutrophils Relative %: 70 % (ref 43–77)
Platelets: 330 10*3/uL (ref 150–400)
RBC: 4.95 MIL/uL (ref 3.87–5.11)
RDW: 13.8 % (ref 11.5–15.5)
WBC: 10.2 10*3/uL (ref 4.0–10.5)

## 2014-07-26 LAB — VITAMIN B12: Vitamin B-12: 430 pg/mL (ref 211–911)

## 2014-07-26 LAB — BASIC METABOLIC PANEL WITH GFR
BUN: 26 mg/dL — ABNORMAL HIGH (ref 6–23)
CO2: 26 mEq/L (ref 19–32)
Calcium: 10.4 mg/dL (ref 8.4–10.5)
Chloride: 97 mEq/L (ref 96–112)
Creat: 1.21 mg/dL — ABNORMAL HIGH (ref 0.50–1.10)
GFR, Est African American: 53 mL/min — ABNORMAL LOW
GFR, Est Non African American: 46 mL/min — ABNORMAL LOW
Glucose, Bld: 197 mg/dL — ABNORMAL HIGH (ref 70–99)
Potassium: 4.4 mEq/L (ref 3.5–5.3)
Sodium: 140 mEq/L (ref 135–145)

## 2014-07-26 LAB — LIPID PANEL
Cholesterol: 175 mg/dL (ref 0–200)
HDL: 34 mg/dL — ABNORMAL LOW (ref >39)
Total CHOL/HDL Ratio: 5.1 Ratio
Triglycerides: 572 mg/dL — ABNORMAL HIGH (ref <150)

## 2014-07-26 LAB — MAGNESIUM: Magnesium: 1.3 mg/dL — ABNORMAL LOW (ref 1.5–2.5)

## 2014-07-26 LAB — HEPATIC FUNCTION PANEL
ALT: 22 U/L (ref 0–35)
AST: 17 U/L (ref 0–37)
Albumin: 4.4 g/dL (ref 3.5–5.2)
Alkaline Phosphatase: 56 U/L (ref 39–117)
Bilirubin, Direct: 0.1 mg/dL (ref 0.0–0.3)
Indirect Bilirubin: 0.3 mg/dL (ref 0.2–1.2)
Total Bilirubin: 0.4 mg/dL (ref 0.2–1.2)
Total Protein: 7.3 g/dL (ref 6.0–8.3)

## 2014-07-26 LAB — TSH: TSH: 1.307 u[IU]/mL (ref 0.350–4.500)

## 2014-07-26 MED ORDER — DEXAMETHASONE SODIUM PHOSPHATE 100 MG/10ML IJ SOLN
10.0000 mg | Freq: Once | INTRAMUSCULAR | Status: AC
  Administered 2014-07-26: 10 mg via INTRAMUSCULAR

## 2014-07-26 MED ORDER — DEXAMETHASONE SODIUM PHOSPHATE 10 MG/ML IJ SOLN: 10.0000 mg | Freq: Once | INTRAMUSCULAR | Status: DC

## 2014-07-26 NOTE — Patient Instructions (Addendum)
Recommend the book "The END of DIETING" by Dr Baker Janus   and the book "The END of DIABETES " by Dr Excell Seltzer  At [email or web address] - get book & Audio CD's      Being diabetic has a  300% increased risk for heart attack, stroke, cancer, and alzheimer- type vascular dementia. It is very important that you work harder with diet by avoiding all foods that are white except chicken & fish. Avoid white rice (brown & wild rice is OK), white potatoes (sweetpotatoes in moderation is OK), White bread or wheat bread or anything made out of white flour like bagels, donuts, rolls, buns, biscuits, cakes, pastries, cookies, pizza crust, and pasta (made from white flour & egg whites) - vegetarian pasta or spinach or wheat pasta is OK. Multigrain breads like Arnold's or [employer], or multigrain sandwich thins or flatbreads.  Diet, exercise and weight loss can reverse and cure diabetes in the early stages.  Diet, exercise and weight loss is very important in the control and prevention of complications of diabetes which affects every system in your body, ie. Brain - dementia/stroke, eyes - glaucoma/blindness, heart - heart attack/heart failure, kidneys - dialysis, stomach - gastric paralysis, intestines - malabsorption, nerves - severe painful neuritis, circulation - gangrene & loss of a leg(s), and finally cancer and Alzheimers.    I recommend avoid fried & greasy foods,  sweets/candy, white rice (brown or wild rice or Quinoa is OK), white potatoes (sweet potatoes are OK) - anything made from white flour - bagels, doughnuts, rolls, buns, biscuits,white and wheat breads, pizza crust and traditional pasta made of white flour & egg white(vegetarian pasta or spinach or wheat pasta is OK).  Multi-grain bread is OK - like multi-grain flat bread or sandwich thins. Avoid alcohol in excess. Exercise is also important.    Eat all the vegetables you want - avoid meat, especially red meat and dairy - especially cheese.  Cheese  is the most concentrated form of trans-fats which is the worst thing to clog up our arteries. Veggie cheese is OK which can be found in the fresh produce section at [facility] or [employer] or [employer] .preventw Preventive Care for Adults A healthy lifestyle and preventive care can promote health and wellness. Preventive health guidelines for women include the following key practices.  A routine yearly physical is a good way to check with your health care provider about your health and preventive screening. It is a chance to share any concerns and updates on your health and to receive a thorough exam.  Visit your dentist for a routine exam and preventive care every 6 months. Brush your teeth twice a day and floss once a day. Good oral hygiene prevents tooth decay and gum disease.  The frequency of eye exams is based on your age, health, family medical history, use of contact lenses, and other factors. Follow your health care provider's recommendations for frequency of eye exams.  Eat a healthy diet. Foods like vegetables, fruits, whole grains, low-fat dairy products, and lean protein foods contain the nutrients you need without too many calories. Decrease your intake of foods high in solid fats, added sugars, and salt. Eat the right amount of calories for you.Get information about a proper diet from your health care provider, if necessary.  Regular physical exercise is one of the most important things you can do for your health. Most adults should get at least 150 minutes of moderate-intensity exercise (any activity that increases  your heart rate and causes you to sweat) each week. In addition, most adults need muscle-strengthening exercises on 2 or more days a week.  Maintain a healthy weight. The body mass index (BMI) is a screening tool to identify possible weight problems. It provides an estimate of body fat based on height and weight. Your health care provider can find your BMI and can help  you achieve or maintain a healthy weight.For adults 20 years and older:  A BMI below 18.5 is considered underweight.  A BMI of 18.5 to 24.9 is normal.  A BMI of 25 to 29.9 is considered overweight.  A BMI of 30 and above is considered obese.  Maintain normal blood lipids and cholesterol levels by exercising and minimizing your intake of saturated fat. Eat a balanced diet with plenty of fruit and vegetables. If your lipid or cholesterol levels are high, you are over 50, or you are at high risk for heart disease, you may need your cholesterol levels checked more frequently.Ongoing high lipid and cholesterol levels should be treated with medicines if diet and exercise are not working.  If you smoke, find out from your health care provider how to quit. If you do not use tobacco, do not start.  Lung cancer screening is recommended for adults aged 70-80 years who are at high risk for developing lung cancer because of a history of smoking. A yearly low-dose CT scan of the lungs is recommended for people who have at least a 30-pack-year history of smoking and are a current smoker or have quit within the past 15 years. A pack year of smoking is smoking an average of 1 pack of cigarettes a day for 1 year (for example: 1 pack a day for 30 years or 2 packs a day for 15 years). Yearly screening should continue until the smoker has stopped smoking for at least 15 years. Yearly screening should be stopped for people who develop a health problem that would prevent them from having lung cancer treatment.  Avoid use of street drugs. Do not share needles with anyone. Ask for help if you need support or instructions about stopping the use of drugs.  High blood pressure causes heart disease and increases the risk of stroke.  Ongoing high blood pressure should be treated with medicines if weight loss and exercise do not work.  If you are 69-53 years old, ask your health care provider if you should take aspirin to  prevent strokes.  Diabetes screening involves taking a blood sample to check your fasting blood sugar level. This should be done once every 3 years, after age 24, if you are within normal weight and without risk factors for diabetes. Testing should be considered at a younger age or be carried out more frequently if you are overweight and have at least 1 risk factor for diabetes.  Breast cancer screening is essential preventive care for women. You should practice "breast self-awareness." This means understanding the normal appearance and feel of your breasts and may include breast self-examination. Any changes detected, no matter how small, should be reported to a health care provider. Women in their 63s and 30s should have a clinical breast exam (CBE) by a health care provider as part of a regular health exam every 1 to 3 years. After age 21, women should have a CBE every year. Starting at age 48, women should consider having a mammogram (breast X-ray test) every year. Women who have a family history of breast cancer should  talk to their health care provider about genetic screening. Women at a high risk of breast cancer should talk to their health care providers about having an MRI and a mammogram every year.  Breast cancer gene (BRCA)-related cancer risk assessment is recommended for women who have family members with BRCA-related cancers. BRCA-related cancers include breast, ovarian, tubal, and peritoneal cancers. Having family members with these cancers may be associated with an increased risk for harmful changes (mutations) in the breast cancer genes BRCA1 and BRCA2. Results of the assessment will determine the need for genetic counseling and BRCA1 and BRCA2 testing.  Routine pelvic exams to screen for cancer are no longer recommended for nonpregnant women who are considered low risk for cancer of the pelvic organs (ovaries, uterus, and vagina) and who do not have symptoms. Ask your health care provider  if a screening pelvic exam is right for you.  If you have had past treatment for cervical cancer or a condition that could lead to cancer, you need Pap tests and screening for cancer for at least 20 years after your treatment. If Pap tests have been discontinued, your risk factors (such as having a new sexual partner) need to be reassessed to determine if screening should be resumed. Some women have medical problems that increase the chance of getting cervical cancer. In these cases, your health care provider may recommend more frequent screening and Pap tests.    Colorectal cancer can be detected and often prevented. Most routine colorectal cancer screening begins at the age of 90 years and continues through age 8 years. However, your health care provider may recommend screening at an earlier age if you have risk factors for colon cancer. On a yearly basis, your health care provider may provide home test kits to check for hidden blood in the stool. Use of a small camera at the end of a tube, to directly examine the colon (sigmoidoscopy or colonoscopy), can detect the earliest forms of colorectal cancer. Talk to your health care provider about this at age 86, when routine screening begins. Direct exam of the colon should be repeated every 5-10 years through age 32 years, unless early forms of pre-cancerous polyps or small growths are found.  Osteoporosis is a disease in which the bones lose minerals and strength with aging. This can result in serious bone fractures or breaks. The risk of osteoporosis can be identified using a bone density scan. Women ages 75 years and over and women at risk for fractures or osteoporosis should discuss screening with their health care providers. Ask your health care provider whether you should take a calcium supplement or vitamin D to reduce the rate of osteoporosis.  Menopause can be associated with physical symptoms and risks. Hormone replacement therapy is available to  decrease symptoms and risks. You should talk to your health care provider about whether hormone replacement therapy is right for you.  Use sunscreen. Apply sunscreen liberally and repeatedly throughout the day. You should seek shade when your shadow is shorter than you. Protect yourself by wearing long sleeves, pants, a wide-brimmed hat, and sunglasses year round, whenever you are outdoors.  Once a month, do a whole body skin exam, using a mirror to look at the skin on your back. Tell your health care provider of new moles, moles that have irregular borders, moles that are larger than a pencil eraser, or moles that have changed in shape or color.  Stay current with required vaccines (immunizations).  Influenza vaccine. All adults  should be immunized every year.  Tetanus, diphtheria, and acellular pertussis (Td, Tdap) vaccine. Pregnant women should receive 1 dose of Tdap vaccine during each pregnancy. The dose should be obtained regardless of the length of time since the last dose. Immunization is preferred during the 27th-36th week of gestation. An adult who has not previously received Tdap or who does not know her vaccine status should receive 1 dose of Tdap. This initial dose should be followed by tetanus and diphtheria toxoids (Td) booster doses every 10 years. Adults with an unknown or incomplete history of completing a 3-dose immunization series with Td-containing vaccines should begin or complete a primary immunization series including a Tdap dose. Adults should receive a Td booster every 10 years.    Zoster vaccine. One dose is recommended for adults aged 44 years or older unless certain conditions are present.    Pneumococcal 13-valent conjugate (PCV13) vaccine. When indicated, a person who is uncertain of her immunization history and has no record of immunization should receive the PCV13 vaccine. An adult aged 74 years or older who has certain medical conditions and has not been previously  immunized should receive 1 dose of PCV13 vaccine. This PCV13 should be followed with a dose of pneumococcal polysaccharide (PPSV23) vaccine. The PPSV23 vaccine dose should be obtained at least 8 weeks after the dose of PCV13 vaccine. An adult aged 66 years or older who has certain medical conditions and previously received 1 or more doses of PPSV23 vaccine should receive 1 dose of PCV13. The PCV13 vaccine dose should be obtained 1 or more years after the last PPSV23 vaccine dose.    Pneumococcal polysaccharide (PPSV23) vaccine. When PCV13 is also indicated, PCV13 should be obtained first. All adults aged 89 years and older should be immunized. An adult younger than age 80 years who has certain medical conditions should be immunized. Any person who resides in a nursing home or long-term care facility should be immunized. An adult smoker should be immunized. People with an immunocompromised condition and certain other conditions should receive both PCV13 and PPSV23 vaccines. People with human immunodeficiency virus (HIV) infection should be immunized as soon as possible after diagnosis. Immunization during chemotherapy or radiation therapy should be avoided. Routine use of PPSV23 vaccine is not recommended for American Indians, [place] Natives, or people younger than 65 years unless there are medical conditions that require PPSV23 vaccine. When indicated, people who have unknown immunization and have no record of immunization should receive PPSV23 vaccine. One-time revaccination 5 years after the first dose of PPSV23 is recommended for people aged 19-64 years who have chronic kidney failure, nephrotic syndrome, asplenia, or immunocompromised conditions. People who received 1-2 doses of PPSV23 before age 34 years should receive another dose of PPSV23 vaccine at age 31 years or later if at least 5 years have passed since the previous dose. Doses of PPSV23 are not needed for people immunized with PPSV23 at or after  age 29 years.   Preventive Services / Frequency  Ages 55 years and over  Blood pressure check.  Lipid and cholesterol check.  Lung cancer screening. / Every year if you are aged 29-80 years and have a 30-pack-year history of smoking and currently smoke or have quit within the past 15 years. Yearly screening is stopped once you have quit smoking for at least 15 years or develop a health problem that would prevent you from having lung cancer treatment.  Clinical breast exam.** / Every year after age 40 years.  BRCA-related cancer risk assessment.** / For women who have family members with a BRCA-related cancer (breast, ovarian, tubal, or peritoneal cancers).  Mammogram.** / Every year beginning at age 40 years and continuing for as long as you are in good health. Consult with your health care provider.  Pap test.** / Every 3 years starting at age 30 years through age 65 or 70 years with 3 consecutive normal Pap tests. Testing can be stopped between 65 and 70 years with 3 consecutive normal Pap tests and no abnormal Pap or HPV tests in the past 10 years.  Fecal occult blood test (FOBT) of stool. / Every year beginning at age 50 years and continuing until age 75 years. You may not need to do this test if you get a colonoscopy every 10 years.  Flexible sigmoidoscopy or colonoscopy.** / Every 5 years for a flexible sigmoidoscopy or every 10 years for a colonoscopy beginning at age 50 years and continuing until age 75 years.  Hepatitis C blood test.** / For all people born from 1945 through 1965 and any individual with known risks for hepatitis C.  Osteoporosis screening.** / A one-time screening for women ages 65 years and over and women at risk for fractures or osteoporosis.  Skin self-exam. / Monthly.  Influenza vaccine. / Every year.  Tetanus, diphtheria, and acellular pertussis (Tdap/Td) vaccine.** / 1 dose of Td every 10 years.  Zoster vaccine.** / 1 dose for adults aged 60 years  or older.  Pneumococcal 13-valent conjugate (PCV13) vaccine.** / Consult your health care provider.  Pneumococcal polysaccharide (PPSV23) vaccine.** / 1 dose for all adults aged 65 years and older. Screening for abdominal aortic aneurysm (AAA)  by ultrasound is recommended for people who have history of high blood pressure or who are current or former smokers. 

## 2014-07-26 NOTE — Progress Notes (Signed)
Patient ID: Ashley [name], female   DOB: 03/15/1946, 68 y.o.   MRN: [id number]  [facility] VISIT AND CPE  Assessment:   1. Encounter for general adult medical examination with abnormal findings   2. Essential hypertension  - EKG 12-Lead - [place], RETROPERITNL ABD,  LTD - TSH  3. Hyperlipidemia  - Lipid panel  4. Type 1 diabetes, controlled, with renal manifestation  - Microalbumin / creatinine urine ratio - Hemoglobin A1c  5. Diabetic Peripheral Neuropathy  - HM DIABETES FOOT EXAM - LOW EXTREMITY NEUR EXAM DOCUM  6. Vitamin B 12 deficiency  - Methylmalonic acid, serum - Vitamin B12  7. Vitamin D deficiency  - Vit D  25 hydroxy (rtn osteoporosis monitoring)  8. Screening for rectal cancer   9. Screening for depression   10. At moderate risk for fall   11. Medication management  - Urine Microscopic - CBC with Differential - BASIC METABOLIC PANEL WITH GFR - Hepatic function panel - Magnesium   Plan:   During the course of the visit the patient was educated and counseled about appropriate screening and preventive services including:    Pneumococcal vaccine   Influenza vaccine  Td vaccine  Screening electrocardiogram  Bone densitometry screening  Colorectal cancer screening  Diabetes screening  Glaucoma screening  Nutrition counseling   Advanced directives: requested  Screening recommendations, referrals: Vaccinations: DT vaccine 2010 Influenza vaccine declined Pneumococcal vaccine 11/16/2012 Prevnar vaccine declined Shingles vaccine 10/03/2008 Hep B vaccine not indicated  Nutrition assessed and recommended  Colonoscopy 04/08/2013 - Dr Ardis Hughs Recommended yearly ophthalmology/optometry visit for glaucoma screening and checkup Recommended yearly dental visit for hygiene and checkup Advanced directives - yes  Conditions/risks identified: BMI: Discussed weight loss, diet, and increase physical activity.  Increase physical  activity: AHA recommends 150 minutes of physical activity a week.  Medications reviewed Diabetes is not at goal due to her admitted poor compliance, ACE/ARB therapy: No, Reason not on Ace Inhibitor/ARB therapy:  On ARB (Losartin) Urinary Incontinence is not an issue: discussed non pharmacology and pharmacology options.  Fall risk: moderate- discussed PT, home fall assessment, medications.   Subjective:   Ashley [name] is a 68 y.o. MWF who presents for Medicare Annual Wellness Visit and complete physical.  Date of last medicare wellness visit is unknown.  She has had elevated blood pressure since 1984. Her blood pressure has been controlled at home, & today their BP is BP: 140/84 mmHg She does not workout. She denies chest pain, shortness of breath, dizziness.  She is on cholesterol medication and denies myalgias. Her cholesterol is near goal, but Trig are elevated due to poor compliance and diabetic control.. The cholesterol last visit was:  Lab Results  Component Value Date   CHOL 201* 03/28/2014   HDL 34* 03/28/2014   LDLCALC NOT CALC 03/28/2014   TRIG 682* 03/28/2014   CHOLHDL 5.9 03/28/2014    She has had diabetes for27 years (1998). She has not been working on diet and exercise for diabetes and denies foot ulcerations, hypoglycemia , polydipsia, polyuria and weight loss. Last A1C in the office was:  Lab Results  Component Value Date   HGBA1C 12.0* 03/28/2014   Patient is on Vitamin D supplement.   Lab Results  Component Value Date   VD25OH 18* 03/28/2014      Names of Other Physician/Practitioners you currently use: 1. [facility] here for primary care 2. Dr [name] [facility], eye doctor, last  visit 2015 3. Dr Delphia Grates, dentist, last visit 2013  Patient Care Team: Unk Pinto, MD as PCP - General (Internal Medicine) Rupinder Charm Rings, MD as Consulting Physician (Psychiatry) [name], MD as Attending  Physician (Obstetrics and Gynecology) [name], MD as Attending Physician (Gastroenterology)  Medication Review: Medication Sig  . albuterol  HFA 108 inhaler Inhale 2 puffs  every 6 hr as needed   . allopurinol (ZYLOPRIM) 300 MG tablet Take 1 tablet by mouth every morning.  Marland Kitchen atorvastatin (LIPITOR) 40 MG tablet Take 1 tablet  daily.  . chlorhexidine (PERIDEX) 0.12 % solution   . clonazePAM (KLONOPIN) 2 MG tablet Take 1 tablet (2 mg total) by mouth 2 (two) times daily.  . colchicine 0.6 MG tablet TAKE 1 TABLET BY MOUTH EVERY DAY  . DiphenhydrAMINE Take 2 tablets by mouth at bedtime.  Marland Kitchen erythromycin with ethanol (EMGEL) 2 % gel Apply topically 2 (two) times daily.  Marland Kitchen FARXIGA 10 MG TABS TAKE 1 TABLET BY MOUTH EVERY MORNING FOR DIABETES  . fenofibrate micronized (LOFIBRA) 134 MG capsule Take 1 capsule (134 mg total) by mouth daily.  . fluconazole (DIFLUCAN) 150 MG tablet Take 1 tablet weekly for yeast infection  . FLUoxetine (PROZAC) 40 MG capsule Take 1 capsule (40 mg total) by mouth daily.  . fluticasone (FLONASE) 50 MCG/ACT nasal spray Place 2 sprays into both nostrils daily.  Marland Kitchen gabapentin (NEURONTIN) 100 MG capsule Take 1 capsule (100 mg total) by mouth 2 (two) times daily.  Marland Kitchen gabapentin (NEURONTIN) 300 MG capsule Take 1 capsule (300 mg total) by mouth every evening.  . hydrochlorothiazide (HYDRODIURIL) 25 MG tablet Take 1 tablet (25 mg total) by mouth every other day.  . hydrocortisone cream 1 % Apply to affected area 2 times daily  . levothyroxine (SYNTHROID, LEVOTHROID) 100 MCG tablet TAKE 1 TABLET BY MOUTH EVERY DAY  . losartan (COZAAR) 50 MG tablet Take 1 tablet (50 mg total) by mouth 2 (two) times daily.  . metFORMIN (GLUCOPHAGE) 1000 MG tablet Take 1 tablet (1,000 mg total) by mouth 2 (two) times daily with a meal  . NOVOLIN N 100 UNIT/ML injection INJECT 50 UNITS EVERY MORNING AND 25 UNITS EVERY EVENING  . polyethylene glycol (MIRALAX / GLYCOLAX) packet Take 17 g by mouth  daily.  . promethazine (PHENERGAN) 25 MG tablet 1/2-1 pill every 6 hours for nausea as needed, max of 4 a day   Current Problems (verified) Patient Active Problem List   Diagnosis Date Noted  . Vitamin D deficiency 12/01/2013  . Medication management 12/01/2013  . Gout 12/01/2013  . Abnormality of gait 03/10/2013  . Other vitamin B12 deficiency anemia 10/25/2012  . Lumbago 10/25/2012  . Carpal tunnel syndrome 10/25/2012  . Diabetic Peripheral Neuropathy 10/25/2012  . Vulva cancer 07/07/2012  . Type 1 diabetes, controlled, with renal manifestation 10/12/2009  . Hyperlipidemia 10/12/2009  . DEPRESSION 10/12/2009  . Essential hypertension 10/12/2009  . COPD 10/12/2009  . Colon Polyps 06/02/2008   Screening Tests Health Maintenance  Topic Date Due  . OPHTHALMOLOGY EXAM  05/12/1957  . URINE MICROALBUMIN  05/12/1957  . INFLUENZA VACCINE  03/18/2014  . HEMOGLOBIN A1C  09/28/2014  . FOOT EXAM  07/27/2015  . MAMMOGRAM  08/24/2015  . COLONOSCOPY  04/18/2018  . TETANUS/TDAP  08/18/2018  . PNEUMOCOCCAL POLYSACCHARIDE VACCINE AGE 69 AND OVER  Completed  . ZOSTAVAX  Completed   Immunization History  Administered Date(s) Administered  . Pneumococcal Conjugate-13 07/26/2014  . Pneumococcal-Unspecified 11/16/2012  .  Td 08/18/2008  . Zoster 10/03/2008   Preventative care: Last colonoscopy: 04/08/2013 - Dr Ardis Hughs  Prior vaccinations: TD : 2010  Influenza: declines  Pneumococcal: 11/16/2012 Prevnar: declined Shingles/Zostavax: 10/03/2008  History reviewed: allergies, current medications, past family history, past medical history, past social history, past surgical history and problem list  Risk Factors: Tobacco History  Substance Use Topics  . Smoking status: Current Every Day Smoker -- 2.00 packs/day for 27 years    Types: Cigarettes  . Smokeless tobacco: Never Used     Comment: Information on smoking cessation offered, pt refused information at this time  . Alcohol Use: No    She does smoke ( 3 ppd x 46 yrs = 138 pk yrs).  Are there smokers in your home (other than you)?  No  Alcohol Current alcohol use: none  Caffeine Current caffeine use: coffee 2-4 cups /day  Exercise Current exercise: none  Nutrition/Diet Current diet: in general, an "unhealthy" diet, on average, 3 meals per day, on average, 5 fast food meals per week, high fat/ cholesterol, high salt  Cardiac risk factors: advanced age (older than 67 for men, 43 for women), diabetes mellitus, dyslipidemia, hypertension, obesity (BMI >= 30 kg/m2), sedentary lifestyle and smoking/ tobacco exposure.  Depression Screen (Note: if answer to either of the following is "Yes", a more complete depression screening is indicated)   Q1: Over the past two weeks, have you felt down, depressed or hopeless? No  Q2: Over the past two weeks, have you felt little interest or pleasure in doing things? No  Have you lost interest or pleasure in daily life? No  Do you often feel hopeless? No  Do you cry easily over simple problems? No  Activities of Daily Living In your present state of health, do you have any difficulty performing the following activities?:  Driving? No Managing money?  No Feeding yourself? No Getting from bed to chair? No Climbing a flight of stairs? No Preparing food and eating?: No Bathing or showering? No Getting dressed: No Getting to the toilet? No Using the toilet:No Moving around from place to place: No In the past year have you fallen or had a near fall?:Yes & attributes to poor balance   Are you sexually active?  No  Do you have more than one partner?  No  Vision Difficulties: No  Hearing Difficulties: No Do you often ask people to speak up or repeat themselves? No Do you experience ringing or noises in your ears? No Do you have difficulty understanding soft or whispered voices? Sometimes.  Cognition  Do you feel that you have a problem with memory?No  Do you often  misplace items? No  Do you feel safe at home?  Yes  Advanced directives Does patient have a [place]? Yes Does patient have a Living Will? Yes  In addition to the HPI above,  No Fever-chills,  No Headache, No changes with Vision or hearing,  No problems swallowing food or Liquids,  No Chest pain or productive Cough or Shortness of Breath,  No Abdominal pain, No Nausea or Vomitting, Bowel movements are regular,  No Blood in stool or Urine,  No dysuria,  No new skin rashes or bruises,  No new joints pains-aches,  No new weakness, tingling, numbness in any extremity,  No recent weight loss,  No polyuria, polydypsia or polyphagia,  No significant Mental Stressors.  A full 10 point Review of Systems was done, except as stated above, all other  Review of Systems were negative  Objective:     BP 140/84, pulse 88, temp97.3 F , resp 16, ht 5' 3.75" , wt 176 lb           BMI  30.46   General appearance: alert, well over nourished &  no distress, WD/WN, female Cognitive Testing  Alert? Yes  Normal Appearance?Yes  Oriented to person? Yes - Place? Yes - Time? Yes  Recall of three objects?  Yes  Can perform simple calculations? Yes  Displays appropriate judgment? Yes  Can read the correct time from a watch/clock?Yes  HEENT: normocephalic, sclerae anicteric, TMs pearly, nares patent, no discharge or erythema, pharynx normal Oral cavity: MMM, no lesions Neck: supple, no lymphadenopathy, no thyromegaly, no masses Heart: RRR, normal S1, S2, no murmurs Lungs: CTA bilaterally, no wheezes, rhonchi, or rales Abdomen: +bs, soft, non tender, non distended, no masses, no hepatomegaly, no splenomegaly Musculoskeletal: nontender, no swelling, no obvious deformity Extremities: no edema, no cyanosis, no clubbing Pulses: 2+ symmetric, upper and lower extremities, normal cap refill Neurological: alert, oriented x 3, CN2-12 intact, strength normal upper extremities and lower  extremities, sensation decreased in a distal stocking distribution by monofilament testing. DTRs 1+ UE's & absent LE's, no cerebellar signs, gait unstable & uses a cane for stability Psychiatric: Indifferent affect, pleasant   Medicare Attestation I have personally reviewed: The patient's medical and social history Their use of alcohol, tobacco or illicit drugs Their current medications and supplements The patient's functional ability including ADLs,fall risks, home safety risks, cognitive, and hearing and visual impairment Diet and physical activities Evidence for depression or mood disorders  The patient's weight, height, BMI, and visual acuity have been recorded in the chart.  I have made referrals, counseling, and provided education to the patient based on review of the above and I have provided the patient with a written personalized care plan for preventive services.    Maripaz Mullan DAVID, MD   07/26/2014

## 2014-07-27 LAB — URINALYSIS, MICROSCOPIC ONLY
Bacteria, UA: NONE SEEN
Casts: NONE SEEN
Crystals: NONE SEEN

## 2014-07-27 LAB — HEMOGLOBIN A1C
Hgb A1c MFr Bld: 8.5 % — ABNORMAL HIGH (ref <5.7)
Mean Plasma Glucose: 197 mg/dL — ABNORMAL HIGH (ref <117)

## 2014-07-27 LAB — MICROALBUMIN / CREATININE URINE RATIO
Creatinine, Urine: 108.5 mg/dL
Microalb Creat Ratio: 39.6 mg/g — ABNORMAL HIGH (ref 0.0–30.0)
Microalb, Ur: 4.3 mg/dL — ABNORMAL HIGH (ref <2.0)

## 2014-07-27 LAB — VITAMIN D 25 HYDROXY (VIT D DEFICIENCY, FRACTURES): Vit D, 25-Hydroxy: 42 ng/mL (ref 30–100)

## 2014-07-29 LAB — METHYLMALONIC ACID, SERUM: Methylmalonic Acid, Quant: 159 nmol/L (ref 87–318)

## 2014-07-31 ENCOUNTER — Telehealth: Payer: Self-pay

## 2014-07-31 NOTE — Telephone Encounter (Signed)
Left message for patient to return call for lab results. 

## 2014-07-31 NOTE — Telephone Encounter (Signed)
-----   Message from Unk Pinto, MD sent at 07/31/2014 12:00 AM EST ----- - Kidney tests suggest dehydrated & need to drink lots more fluids - Mag extremely low - Recc take 500 mg  3 x day with meals - Chol 125 - great - A1c better - was 12.0 % - now down to 8.5% better but still too high - need stricter diet and weight loss - Vit D - 42 - still very low - recc take extra Vit D 5,000 units more every day

## 2014-08-10 ENCOUNTER — Other Ambulatory Visit: Payer: Self-pay | Admitting: Emergency Medicine

## 2014-09-07 ENCOUNTER — Other Ambulatory Visit: Payer: Self-pay | Admitting: *Deleted

## 2014-09-07 MED ORDER — HYDROCHLOROTHIAZIDE 25 MG PO TABS: 25.0000 mg | ORAL_TABLET | ORAL | Status: DC

## 2014-09-07 MED ORDER — FENOFIBRATE 145 MG PO TABS: 145.0000 mg | ORAL_TABLET | Freq: Every day | ORAL | Status: DC

## 2014-09-19 ENCOUNTER — Encounter: Payer: Self-pay | Admitting: Physician Assistant

## 2014-09-19 ENCOUNTER — Ambulatory Visit (INDEPENDENT_AMBULATORY_CARE_PROVIDER_SITE_OTHER): Payer: PPO | Admitting: Physician Assistant

## 2014-09-19 VITALS — BP 128/80 | HR 72 | Temp 97.7°F | Resp 16 | Ht 63.75 in | Wt 181.0 lb

## 2014-09-19 DIAGNOSIS — R519 Headache, unspecified: Secondary | ICD-10-CM

## 2014-09-19 DIAGNOSIS — R590 Localized enlarged lymph nodes: Secondary | ICD-10-CM

## 2014-09-19 DIAGNOSIS — F17200 Nicotine dependence, unspecified, uncomplicated: Secondary | ICD-10-CM

## 2014-09-19 DIAGNOSIS — J0100 Acute maxillary sinusitis, unspecified: Secondary | ICD-10-CM

## 2014-09-19 DIAGNOSIS — R51 Headache: Secondary | ICD-10-CM

## 2014-09-19 DIAGNOSIS — M25551 Pain in right hip: Secondary | ICD-10-CM

## 2014-09-19 MED ORDER — AZITHROMYCIN 250 MG PO TABS: ORAL_TABLET | ORAL | Status: DC

## 2014-09-19 NOTE — Progress Notes (Signed)
Subjective:    Patient ID: Ashley [name], female    DOB: 1946/07/23, 69 y.o.   MRN: [id number]  HPI 69 y.o. 138 pack year history of smoking obese, white female with history of HTN, DM, hyperlipidemia, CVA presents with HA. She states she woke up yesterday morning with a pain that started right temporal/parietl straight into her jaw. She states it was worse with opening her jaw yesterday, resolved last night during sleeping, no pain at this time. Denies changes in vision, speech, weakness. Has constant imbalance.  She has had sinus issues, states it is constant, she has history of TMJ.  She then noticed a bump this AM on her submental/right cervical.  CT head 10/2013  IMPRESSION: 1. No acute intracranial infarct or other process identified. 2. 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 3. Mild generalized cerebral atrophy.  She is still complaining of right hip bursitis. She also complains of right hip pain, worse while sitting for a long time and worse at night.     Current Outpatient Prescriptions on File Prior to Visit  Medication Sig Dispense Refill  . albuterol (PROVENTIL HFA;VENTOLIN HFA) 108 (90 BASE) MCG/ACT inhaler Inhale 2 puffs into the lungs every 6 (six) hours as needed for wheezing or shortness of breath. 1 Inhaler 2  . allopurinol (ZYLOPRIM) 300 MG tablet Take 1 tablet (300 mg total) by mouth every morning. 90 tablet 2  . atorvastatin (LIPITOR) 40 MG tablet Take 1 tablet (40 mg total) by mouth daily. 90 tablet 2  . chlorhexidine (PERIDEX) 0.12 % solution   0  . clonazePAM (KLONOPIN) 2 MG tablet Take 1 tablet (2 mg total) by mouth 2 (two) times daily. 60 tablet 0  . colchicine 0.6 MG tablet TAKE 1 TABLET BY MOUTH EVERY DAY 30 tablet 1  . DiphenhydrAMINE HCl, Sleep, ([employer] SLEEP AID NIGHTTIME PO) Take 2 tablets by mouth at bedtime.    Marland Kitchen erythromycin with ethanol (EMGEL) 2 % gel Apply topically 2 (two) times daily. 30 g 2   . FARXIGA 10 MG TABS TAKE 1 TABLET BY MOUTH EVERY MORNING FOR DIABETES 30 tablet 3  . fenofibrate (TRICOR) 145 MG tablet Take 1 tablet (145 mg total) by mouth daily. 90 tablet 4  . fluconazole (DIFLUCAN) 150 MG tablet Take 1 tablet weekly for yeast infection (Patient taking differently: Take 1 tablet monthly for yeast infection) 4 tablet 99  . FLUoxetine (PROZAC) 40 MG capsule Take 1 capsule (40 mg total) by mouth daily. 90 capsule 3  . fluticasone (FLONASE) 50 MCG/ACT nasal spray Place 2 sprays into both nostrils daily. 16 g 99  . gabapentin (NEURONTIN) 100 MG capsule Take 1 capsule (100 mg total) by mouth 2 (two) times daily. 180 capsule 1  . gabapentin (NEURONTIN) 300 MG capsule Take 1 capsule (300 mg total) by mouth every evening. 90 capsule 1  . hydrochlorothiazide (HYDRODIURIL) 25 MG tablet Take 1 tablet (25 mg total) by mouth every other day. 90 tablet 4  . hydrocortisone cream 1 % Apply to affected area 2 times daily 15 g 0  . levothyroxine (SYNTHROID, LEVOTHROID) 100 MCG tablet TAKE 1 TABLET EVERY DAY 90 tablet 2  . losartan (COZAAR) 50 MG tablet Take 1 tablet (50 mg total) by mouth 2 (two) times daily. 180 tablet 3  . metFORMIN (GLUCOPHAGE) 1000 MG tablet Take 1 tablet (1,000 mg total) by mouth 2 (two) times daily with a meal. 1 tablet twice daily 180 tablet  1  . NOVOLIN N 100 UNIT/ML injection INJECT 50 UNITS EVERY MORNING AND 25 UNITS EVERY EVENING 30 mL 2  . Omeprazole 20 MG TBEC Take 20 mg by mouth daily.    . polyethylene glycol (MIRALAX / GLYCOLAX) packet Take 17 g by mouth daily.    . promethazine (PHENERGAN) 25 MG tablet 1/2-1 pill every 6 hours for nausea as needed, max of 4 a day 60 tablet 0   No current facility-administered medications on file prior to visit.   Past Medical History  Diagnosis Date  . Diabetes mellitus   . Asthma   . Hypertension   . Hypercholesteremia   . Gout LEFT FOOT-  STABLE  . Neuropathy of lower extremity   . Bipolar 1 disorder   . OCD  (obsessive compulsive disorder)   . Depression   . SOB (shortness of breath) on exertion   . GERD (gastroesophageal reflux disease)   . H/O hiatal hernia   . SUI (stress urinary incontinence, female)   . Arthritis LOWER BACK  . History of CVA (cerebrovascular accident) FOUND PER MRI 1994--  RESIDUAL MEMORY IMPAIRED  . Vulvar lesion   . Anxiety disorder   . Carpal tunnel syndrome   . Peripheral neuropathy   . Dyslipidemia   . Gait disorder    Review of Systems  Constitutional: Positive for fatigue (constant). Negative for fever, chills, diaphoresis, activity change, appetite change and unexpected weight change.  HENT: Positive for congestion, postnasal drip, rhinorrhea and sinus pressure. Negative for dental problem, drooling, ear discharge, ear pain, facial swelling, hearing loss, mouth sores, nosebleeds, sneezing, sore throat, tinnitus, trouble swallowing and voice change.   Eyes: Negative.  Negative for pain and visual disturbance.  Respiratory: Negative.  Negative for shortness of breath.   Cardiovascular: Negative.  Negative for chest pain.  Gastrointestinal: Negative.   Genitourinary: Negative.   Musculoskeletal: Negative.   Skin: Negative.  Negative for rash.  Neurological: Positive for dizziness (states this is chronic and unchanged) and headaches. Negative for tremors, seizures, syncope, facial asymmetry, speech difficulty, weakness, light-headedness and numbness.  Hematological: Positive for adenopathy (right side). Bruises/bleeds easily.       Objective:   Physical Exam General Appearance: Obese, in no apparent distress. Eyes: PERRLA, EOMs, conjunctiva no swelling or erythema Sinuses: + maxillary tenderness ENT/Mouth: Ext aud canals clear, TMs without erythema, bulging. No erythema, swelling, or exudate on post pharynx.  Tonsils not swollen or erythematous. Hearing normal. + TMJ tenderness bilateral  Neck: Supple, thyroid normal.  Respiratory: Respiratory effort normal,  BS equal bilaterally without rales, rhonchi, wheezing or stridor.  Cardio: RRR with no MRGs. Brisk peripheral pulses with 1+ edema Abdomen: Soft, + BS, obese  Non tender, no guarding, rebound, hernias, masses. Lymphatics: + right cervical lymphadenopathy.  Musculoskeletal: Full ROM, 4/5 strength, normal antalgic with cane Neuro: Cranial nerves intact. No cerebellar symptoms. Decreased sensation bilateral feet.  Psych: Awake and oriented X 3, normal affect, very poor insight to the severity of her medical conditions and the risk of MI/stroke.      Assessment & Plan:  Right hip bursitis/lower back/versus IT band- stretches given, will refer to ortho for possible infection/referral PT.   Headache -resolved, normal neuro, high risk stroke/MI- add bASA, risk modification, long discussion about smoking/DM. ? From sinusitis- will send in zpak, will treat TMJ. if worsening HA, changes vision/speech, imbalance, weakness go to the ER  Poor sight/noncompliance- long discussion about high risk MI/stroke due to DM/smoking, very poor insight to conditions.  Right cervical lymphadenopathy- get soft tissues US, stop smoking. May need CT neck.

## 2014-09-19 NOTE — Patient Instructions (Addendum)
Use a dropper or use a cap to put olive oil,mineral oil or canola oil in the effected ear- 2-3 times a week. Let it soak for 20-30 min then you can take a shower or use a baby bulb with warm water to wash out the ear wax.  Do not use Qtips  Add ENTERIC COATED low dose 81 mg Aspirin daily OR can do every other day if you have easy bruising to protect your heart and head.   What is the TMJ? The temporomandibular (tem-PUH-ro-man-DIB-yoo-ler) joint, or the TMJ, connects the upper and lower jawbones. This joint allows the jaw to open wide and move back and forth when you chew, talk, or yawn.There are also several muscles that help this joint move. There can be muscle tightness and pain in the muscle that can cause several symptoms.  What causes TMJ pain? There are many causes of TMJ pain. Repeated chewing (for example, chewing gum) and clenching your teeth can cause pain in the joint. Some TMJ pain has no obvious cause. What can I do to ease the pain? There are many things you can do to help your pain get better. When you have pain:  Eat soft foods and stay away from chewy foods (for example, taffy) Try to use both sides of your mouth to chew Don't chew gum Massage Don't open your mouth wide (for example, during yawning or singing) Don't bite your cheeks or fingernails Lower your amount of stress and worry Applying a warm, damp washcloth to the joint may help. Over-the-counter pain medicines such as ibuprofen (one brand: Advil) or acetaminophen (one brand: Tylenol) might also help. Do not use these medicines if you are allergic to them or if your doctor told you not to use them. How can I stop the pain from coming back? When your pain is better, you can do these exercises to make your muscles stronger and to keep the pain from coming back:  Resisted mouth opening: Place your thumb or two fingers under your chin and open your mouth slowly, pushing up lightly on your chin with your thumb. Hold for  three to six seconds. Close your mouth slowly. Resisted mouth closing: Place your thumbs under your chin and your two index fingers on the ridge between your mouth and the bottom of your chin. Push down lightly on your chin as you close your mouth. Tongue up: Slowly open and close your mouth while keeping the tongue touching the roof of the mouth. Side-to-side jaw movement: Place an object about one fourth of an inch thick (for example, two tongue depressors) between your front teeth. Slowly move your jaw from side to side. Increase the thickness of the object as the exercise becomes easier Forward jaw movement: Place an object about one fourth of an inch thick between your front teeth and move the bottom jaw forward so that the bottom teeth are in front of the top teeth. Increase the thickness of the object as the exercise becomes easier. These exercises should not be painful. If it hurts to do these exercises, stop doing them and talk to your family doctor.   General Headache Without Cause A headache is pain or discomfort felt around the head or neck area. The specific cause of a headache may not be found. There are many causes and types of headaches. A few common ones are:  Tension headaches.  Migraine headaches.  Cluster headaches.  Chronic daily headaches. HOME CARE INSTRUCTIONS   Keep all follow-up appointments  with your caregiver or any specialist referral.  Only take over-the-counter or prescription medicines for pain or discomfort as directed by your caregiver.  Lie down in a dark, quiet room when you have a headache.  Keep a headache journal to find out what may trigger your migraine headaches. For example, write down:  What you eat and drink.  How much sleep you get.  Any change to your diet or medicines.  Try massage or other relaxation techniques.  Put ice packs or heat on the head and neck. Use these 3 to 4 times per day for 15 to 20 minutes each time, or as  needed.  Limit stress.  Sit up straight, and do not tense your muscles.  Quit smoking if you smoke.  Limit alcohol use.  Decrease the amount of caffeine you drink, or stop drinking caffeine.  Eat and sleep on a regular schedule.  Get 7 to 9 hours of sleep, or as recommended by your caregiver.  Keep lights dim if bright lights bother you and make your headaches worse. SEEK MEDICAL CARE IF:   You have problems with the medicines you were prescribed.  Your medicines are not working.  You have a change from the usual headache.  You have nausea or vomiting. SEEK IMMEDIATE MEDICAL CARE IF:   Your headache becomes severe.  You have a fever.  You have a stiff neck.  You have loss of vision.  You have muscular weakness or loss of muscle control.  You start losing your balance or have trouble walking.  You feel faint or pass out.  You have severe symptoms that are different from your first symptoms. MAKE SURE YOU:   Understand these instructions.  Will watch your condition.  Will get help right away if you are not doing well or get worse. Document Released: 08/04/2005 Document Revised: 10/27/2011 Document Reviewed: 08/20/2011 [facility] Patient Information 2015 [place], [place]. This information is not intended to replace advice given to you by your health care provider. Make sure you discuss any questions you have with your health care provider.

## 2014-09-21 ENCOUNTER — Ambulatory Visit
Admission: RE | Admit: 2014-09-21 | Discharge: 2014-09-21 | Disposition: A | Discharge status: Home / Self Care | Payer: PPO | Source: Ambulatory Visit | Attending: Physician Assistant | Admitting: Physician Assistant

## 2014-09-21 DIAGNOSIS — R590 Localized enlarged lymph nodes: Secondary | ICD-10-CM

## 2014-10-12 ENCOUNTER — Other Ambulatory Visit: Payer: Self-pay

## 2014-10-12 MED ORDER — METFORMIN HCL 1000 MG PO TABS: 1000.0000 mg | ORAL_TABLET | Freq: Two times a day (BID) | ORAL | Status: DC

## 2014-10-12 MED ORDER — HYDROCHLOROTHIAZIDE 25 MG PO TABS: 25.0000 mg | ORAL_TABLET | ORAL | Status: DC

## 2014-10-26 ENCOUNTER — Encounter: Payer: Self-pay | Admitting: Physician Assistant

## 2014-10-26 ENCOUNTER — Ambulatory Visit (INDEPENDENT_AMBULATORY_CARE_PROVIDER_SITE_OTHER): Payer: PPO | Admitting: Physician Assistant

## 2014-10-26 VITALS — BP 138/70 | HR 80 | Temp 97.7°F | Resp 16 | Ht 63.75 in | Wt 179.0 lb

## 2014-10-26 DIAGNOSIS — E782 Mixed hyperlipidemia: Secondary | ICD-10-CM

## 2014-10-26 DIAGNOSIS — Z79899 Other long term (current) drug therapy: Secondary | ICD-10-CM

## 2014-10-26 DIAGNOSIS — M109 Gout, unspecified: Secondary | ICD-10-CM

## 2014-10-26 DIAGNOSIS — F17200 Nicotine dependence, unspecified, uncomplicated: Secondary | ICD-10-CM

## 2014-10-26 DIAGNOSIS — E559 Vitamin D deficiency, unspecified: Secondary | ICD-10-CM

## 2014-10-26 DIAGNOSIS — I10 Essential (primary) hypertension: Secondary | ICD-10-CM

## 2014-10-26 DIAGNOSIS — E1029 Type 1 diabetes mellitus with other diabetic kidney complication: Secondary | ICD-10-CM

## 2014-10-26 DIAGNOSIS — G63 Polyneuropathy in diseases classified elsewhere: Secondary | ICD-10-CM

## 2014-10-26 DIAGNOSIS — IMO0001 Reserved for inherently not codable concepts without codable children: Secondary | ICD-10-CM

## 2014-10-26 LAB — HEPATIC FUNCTION PANEL
ALT: 18 U/L (ref 0–35)
AST: 13 U/L (ref 0–37)
Albumin: 4 g/dL (ref 3.5–5.2)
Alkaline Phosphatase: 53 U/L (ref 39–117)
Bilirubin, Direct: 0.1 mg/dL (ref 0.0–0.3)
Indirect Bilirubin: 0.3 mg/dL (ref 0.2–1.2)
Total Bilirubin: 0.4 mg/dL (ref 0.2–1.2)
Total Protein: 6.3 g/dL (ref 6.0–8.3)

## 2014-10-26 LAB — BASIC METABOLIC PANEL WITH GFR
BUN: 16 mg/dL (ref 6–23)
CO2: 27 mEq/L (ref 19–32)
Calcium: 9.4 mg/dL (ref 8.4–10.5)
Chloride: 97 mEq/L (ref 96–112)
Creat: 0.88 mg/dL (ref 0.50–1.10)
GFR, Est African American: 79 mL/min
GFR, Est Non African American: 68 mL/min
Glucose, Bld: 117 mg/dL — ABNORMAL HIGH (ref 70–99)
Potassium: 3.5 mEq/L (ref 3.5–5.3)
Sodium: 138 mEq/L (ref 135–145)

## 2014-10-26 LAB — CBC WITH DIFFERENTIAL/PLATELET
Basophils Absolute: 0.1 10*3/uL (ref 0.0–0.1)
Basophils Relative: 1 % (ref 0–1)
Eosinophils Absolute: 0.2 10*3/uL (ref 0.0–0.7)
Eosinophils Relative: 2 % (ref 0–5)
HCT: 41.5 % (ref 36.0–46.0)
Hemoglobin: 13.8 g/dL (ref 12.0–15.0)
Lymphocytes Relative: 32 % (ref 12–46)
Lymphs Abs: 2.8 10*3/uL (ref 0.7–4.0)
MCH: 29.4 pg (ref 26.0–34.0)
MCHC: 33.3 g/dL (ref 30.0–36.0)
MCV: 88.5 fL (ref 78.0–100.0)
MPV: 9.7 fL (ref 8.6–12.4)
Monocytes Absolute: 0.5 10*3/uL (ref 0.1–1.0)
Monocytes Relative: 6 % (ref 3–12)
Neutro Abs: 5.1 10*3/uL (ref 1.7–7.7)
Neutrophils Relative %: 59 % (ref 43–77)
Platelets: 270 10*3/uL (ref 150–400)
RBC: 4.69 MIL/uL (ref 3.87–5.11)
RDW: 15.2 % (ref 11.5–15.5)
WBC: 8.7 10*3/uL (ref 4.0–10.5)

## 2014-10-26 LAB — LIPID PANEL
Cholesterol: 150 mg/dL (ref 0–200)
HDL: 33 mg/dL — ABNORMAL LOW (ref >=46)
LDL Cholesterol: 65 mg/dL (ref 0–99)
Total CHOL/HDL Ratio: 4.5 Ratio
Triglycerides: 258 mg/dL — ABNORMAL HIGH (ref <150)
VLDL: 52 mg/dL — ABNORMAL HIGH (ref 0–40)

## 2014-10-26 LAB — MAGNESIUM: Magnesium: 1.2 mg/dL — ABNORMAL LOW (ref 1.5–2.5)

## 2014-10-26 MED ORDER — ALBUTEROL SULFATE HFA 108 (90 BASE) MCG/ACT IN AERS: 2.0000 | INHALATION_SPRAY | Freq: Four times a day (QID) | RESPIRATORY_TRACT | Status: DC | PRN

## 2014-10-26 NOTE — Patient Instructions (Signed)
Diabetes is a very complicated disease...lets simplify it.  An easy way to look at it to understand the complications is if you think of the extra sugar floating in your blood stream as glass shards floating through your blood stream.    Diabetes affects your small vessels first: 1) The glass shards (sugar) scraps down the tiny blood vessels in your eyes and lead to diabetic retinopathy, the leading cause of blindness in the [place]. Diabetes is the leading cause of newly diagnosed adult (33 to 68 years of age) blindness in the [place].  2) The glass shards scratches down the tiny vessels of your legs leading to nerve damage called neuropathy and can lead to amputations of your feet. More than 60% of all non-traumatic amputations of lower limbs occur in people with diabetes.  3) Over time the small vessels in your brain are shredded and closed off, individually this does not cause any problems but over a long period of time many of the small vessels being blocked can lead to Vascular Dementia.   4) Your kidney's are a filter system and have a "net" that keeps certain things in the body and lets bad things out. Sugar shreds this net and leads to kidney damage and eventually failure. Decreasing the sugar that is destroying the net and certain blood pressure medications can help stop or decrease progression of kidney disease. Diabetes was the primary cause of kidney failure in 44 percent of all new cases in 2011.  5) Diabetes also destroys the small vessels in your penis that lead to erectile dysfunction. Eventually the vessels are so damaged that you may not be responsive to cialis or viagra.   Diabetes and your large vessels: Your larger vessels consist of your coronary arteries in your heart and the carotid vessels to your brain. Diabetes or even increased sugars put you at 300% increased risk of heart attack and stroke and this is why.. The sugar scrapes down your large blood vessels and your body  sees this as an internal injury and tries to repair itself. Just like you get a scab on your skin, your platelets will stick to the blood vessel wall trying to heal it. This is why we have diabetics on low dose aspirin daily, this prevents the platelets from sticking and can prevent plaque formation. In addition, your body takes cholesterol and tries to shove it into the open wound. This is why we want your LDL, or bad cholesterol, below 70.   The combination of platelets and cholesterol over 5-10 years forms plaque that can break off and cause a heart attack or stroke.   PLEASE REMEMBER:  Diabetes is preventable! Up to 40 percent of complications and morbidities among individuals with type 2 diabetes can be prevented, delayed, or effectively treated and minimized with regular visits to a health professional, appropriate monitoring and medication, and a healthy diet and lifestyle.   Your A1C is a measure of your sugar over the past 3 months and is not affected by what you have eaten over the past few days. Diabetes increases your chances of stroke and heart attack over 300 % and is the leading cause of blindness and kidney failure in the [place]. Please make sure you decrease bad carbs like white bread, white rice, potatoes, corn, soft drinks, pasta, cereals, refined sugars, sweet tea, dried fruits, and fruit juice. Good carbs are okay to eat in moderation like sweet potatoes, brown rice, whole grain pasta/bread, most fruit (except  dried fruit) and you can eat as many veggies as you want.   Greater than 6.5 is considered diabetic. Between 6.4 and 5.7 is prediabetic If your A1C is less than 5.7 you are NOT diabetic.  Targets for Glucose Readings: Time of Check Target for patients WITHOUT Diabetes Target for DIABETICS  Before Meals Less than 100  less than 150  Two hours after meals Less than 200  Less than 250       Bad carbs also include fruit juice, alcohol, and sweet tea. These are  empty calories that do not signal to your brain that you are full.   Please remember the good carbs are still carbs which convert into sugar. So please measure them out no more than 1/2-1 cup of rice, oatmeal, pasta, and beans  Veggies are however free foods! Pile them on.   Not all fruit is created equal. Please see the list below, the fruit at the bottom is higher in sugars than the fruit at the top. Please avoid all dried fruits.     Ways to cut 100 calories  1. Eat your eggs with hot sauce OR salsa instead of cheese.  Eggs are great for breakfast, but many people consider eggs and cheese to be BFFs. Instead of cheese-1 oz. of cheddar has 114 calories-top your eggs with hot sauce, which contains no calories and helps with satiety and metabolism. Salsa is also a great option!!  2. Top your toast, waffles or pancakes with mashed berries instead of jelly or syrup. Half a cup of berries-fresh, frozen or thawed-has about 40 calories, compared with 2 tbsp. of maple syrup or jelly, which both have about 100 calories. The berries will also give you a good punch of fiber, which helps keep you full and satisfied and won't spike blood sugar quickly like the jelly or syrup. 3. Swap the non-fat latte for black coffee with a splash of half-and-half. Contrary to its name, that non-fat latte has 130 calories and a startling 19g of carbohydrates per 16 oz. serving. Replacing that 'light' drinkable dessert with a black coffee with a splash of half-and-half saves you more than 100 calories per 16 oz. serving. 4. Sprinkle salads with freeze-dried raspberries instead of dried cranberries. If you want a sweet addition to your nutritious salad, stay away from dried cranberries. They have a whopping 130 calories per  cup and 30g carbohydrates. Instead, sprinkle freeze-dried raspberries guilt-free and save more than 100 calories per  cup serving, adding 3g of belly-filling fiber. 5. Go for mustard in place of mayo on  your sandwich. Mustard can add really nice flavor to any sandwich, and there are tons of varieties, from spicy to honey. A serving of mayo is 95 calories, versus 10 calories in a serving of mustard. 6. Choose a DIY salad dressing instead of the store-bought kind. Mix Dijon or whole grain mustard with low-fat Kefir or red wine vinegar and garlic. 7. Use hummus as a spread instead of a dip. Use hummus as a spread on a high-fiber cracker or tortilla with a sandwich and save on calories without sacrificing taste. 8. Pick just one salad "accessory." Salad isn't automatically a calorie winner. It's easy to over-accessorize with toppings. Instead of topping your salad with nuts, avocado and cranberries (all three will clock in at 313 calories), just pick one. The next day, choose a different accessory, which will also keep your salad interesting. You don't wear all your jewelry every day, right? 9. Ditch the  white pasta in favor of spaghetti squash. One cup of cooked spaghetti squash has about 40 calories, compared with traditional spaghetti, which comes with more than 200. Spaghetti squash is also nutrient-dense. It's a good source of fiber and Vitamins A and C, and it can be eaten just like you would eat pasta-with a great tomato sauce and [place] meatballs or with pesto, tofu and spinach, for example. 10. Dress up your chili, soups and stews with non-fat [place] yogurt instead of sour cream. Just a 'dollop' of sour cream can set you back 115 calories and a whopping 12g of fat-seven of which are of the artery-clogging variety. Added bonus: [place] yogurt is packed with muscle-building protein, calcium and B Vitamins. 11. Mash cauliflower instead of mashed potatoes. One cup of traditional mashed potatoes-in all their creamy goodness-has more than 200 calories, compared to mashed cauliflower, which you can typically eat for less than 100 calories per 1 cup serving. Cauliflower is a great source of the antioxidant  indole-3-carbinol (I3C), which may help reduce the risk of some cancers, like breast cancer. 12. Ditch the ice cream sundae in favor of a [place] yogurt parfait. Instead of a cup of ice cream or fro-yo for dessert, try 1 cup of nonfat Greek yogurt topped with fresh berries and a sprinkle of cacao nibs. Both toppings are packed with antioxidants, which can help reduce cellular inflammation and oxidative damage. And the comparison is a no-brainer: One cup of ice cream has about 275 calories; one cup of frozen yogurt has about 230; and a cup of Greek yogurt has just 130, plus twice the protein, so you're less likely to return to the freezer for a second helping. 13. Put olive oil in a spray container instead of using it directly from the bottle. Each tablespoon of olive oil is 120 calories and 15g of fat. Use a mister instead of pouring it straight into the pan or onto a salad. This allows for portion control and will save you more than 100 calories. 14. When baking, substitute canned pumpkin for butter or oil. Canned pumpkin-not pumpkin pie mix-is loaded with Vitamin A, which is important for skin and eye health, as well as immunity. And the comparisons are pretty crazy:  cup of canned pumpkin has about 40 calories, compared to butter or oil, which has more than 800 calories. Yes, 800 calories. Applesauce and mashed banana can also serve as good substitutions for butter or oil, usually in a 1:1 ratio. 15. Top casseroles with high-fiber cereal instead of breadcrumbs. Breadcrumbs are typically made with white bread, while breakfast cereals contain 5-9g of fiber per serving. Not only will you save more than 150 calories per  cup serving, the swap will also keep you more full and you'll get a metabolism boost from the added fiber. 16. Snack on pistachios instead of macadamia nuts. Believe it or not, you get the same amount of calories from 35 pistachios (100 calories) as you would from only five macadamia  nuts. 17. Chow down on kale chips rather than potato chips. This is my favorite 'don't knock it 'till you try it' swap. Kale chips are so easy to make at home, and you can spice them up with a little grated parmesan or chili powder. Plus, they're a mere fraction of the calories of potato chips, but with the same crunch factor we crave so often. 18. Add seltzer and some fruit slices to your cocktail instead of soda or fruit juice. One cup of soda  or fruit juice can pack on as much as 140 calories. Instead, use seltzer and fruit slices. The fruit provides valuable phytochemicals, such as flavonoids and anthocyanins, which help to combat cancer and stave off the aging process.  Before you even begin to attack a weight-loss plan, it pays to remember this: You are not fat. You have fat. Losing weight isn't about blame or shame; it's simply another achievement to accomplish. Dieting is like any other skill-you have to buckle down and work at it. As long as you act in a smart, reasonable way, you'll ultimately get where you want to be. Here are some weight loss pearls for you.  1. It's Not a Diet. It's a Lifestyle Thinking of a diet as something you're on and suffering through only for the short term doesn't work. To shed weight and keep it off, you need to make permanent changes to the way you eat. It's OK to indulge occasionally, of course, but if you cut calories temporarily and then revert to your old way of eating, you'll gain back the weight quicker than you can say yo-yo. Use it to lose it. Research shows that one of the best predictors of long-term weight loss is how many pounds you drop in the first month. For that reason, nutritionists often suggest being stricter for the first two weeks of your new eating strategy to build momentum. Cut out added sugar and alcohol and avoid unrefined carbs. After that, figure out how you can reincorporate them in a way that's healthy and maintainable.  2. There's a  Right Way to Exercise Working out burns calories and fat and boosts your metabolism by building muscle. But those trying to lose weight are notorious for overestimating the number of calories they burn and underestimating the amount they take in. Unfortunately, your system is biologically programmed to hold on to extra pounds and that means when you start exercising, your body senses the deficit and ramps up its hunger signals. If you're not diligent, you'll eat everything you burn and then some. Use it to lose it. Cardio gets all the exercise glory, but strength and interval training are the real heroes. They help you build lean muscle, which in turn increases your metabolism and calorie-burning ability 3. Don't Overreact to Mild Hunger Some people have a hard time losing weight because of hunger anxiety. To them, being hungry is bad-something to be avoided at all costs-so they carry snacks with them and eat when they don't need to. Others eat because they're stressed out or bored. While you never want to get to the point of being ravenous (that's when bingeing is likely to happen), a hunger pang, a craving, or the fact that it's 3:00 p.m. should not send you racing for the vending machine or obsessing about the energy bar in your purse. Ideally, you should put off eating until your stomach is growling and it's difficult to concentrate.  Use it to lose it. When you feel the urge to eat, use the HALT method. Ask yourself, Am I really hungry? Or am I angry or anxious, lonely or bored, or tired? If you're still not certain, try the apple test. If you're truly hungry, an apple should seem delicious; if it doesn't, something else is going on. Or you can try drinking water and making yourself busy, if you are still hungry try a healthy snack.  4. Not All Calories Are Created Equal The mechanics of weight loss are pretty simple: Take in fewer  calories than you use for energy. But the kind of food you eat makes all  the difference. Processed food that's high in saturated fat and refined starch or sugar can cause inflammation that disrupts the hormone signals that tell your brain you're full. The result: You eat a lot more.  Use it to lose it. Clean up your diet. Swap in whole, unprocessed foods, including vegetables, lean protein, and healthy fats that will fill you up and give you the biggest nutritional bang for your calorie buck. In a few weeks, as your brain starts receiving regular hunger and fullness signals once again, you'll notice that you feel less hungry overall and naturally start cutting back on the amount you eat.  5. Protein, Produce, and Plant-Based Fats Are Your Weight-Loss Trinity Here's why eating the three Ps regularly will help you drop pounds. Protein fills you up. You need it to build lean muscle, which keeps your metabolism humming so that you can torch more fat. People in a weight-loss program who ate double the recommended daily allowance for protein (about 110 grams for a 150-pound woman) lost 70 percent of their weight from fat, while people who ate the RDA lost only about 40 percent, one study found. Produce is packed with filling fiber. "It's very difficult to consume too many calories if you're eating a lot of vegetables. Example: Three cups of broccoli is a lot of food, yet only 93 calories. (Fruit is another story. It can be easy to overeat and can contain a lot of calories from sugar, so be sure to monitor your intake.) Plant-based fats like olive oil and those in avocados and nuts are healthy and extra satiating.  Use it to lose it. Aim to incorporate each of the three Ps into every meal and snack. People who eat protein throughout the day are able to keep weight off, according to a study in the [place] of Clinical Nutrition. In addition to meat, poultry and seafood, good sources are beans, lentils, eggs, tofu, and yogurt. As for fat, keep portion sizes in check by measuring  out salad dressing, oil, and nut butters (shoot for one to two tablespoons). Finally, eat veggies or a little fruit at every meal. People who did that consumed 308 fewer calories but didn't feel any hungrier than when they didn't eat more produce.  7. How You Eat Is As Important As What You Eat In order for your brain to register that you're full, you need to focus on what you're eating. Sit down whenever you eat, preferably at a table. Turn off the TV or computer, put down your phone, and look at your food. Smell it. Chew slowly, and don't put another bite on your fork until you swallow. When women ate lunch this attentively, they consumed 30 percent less when snacking later than those who listened to an audiobook at lunchtime, according to a study in the [place] of Nutrition. 8. Weighing Yourself Really Works The scale provides the best evidence about whether your efforts are paying off. Seeing the numbers tick up or down or stagnate is motivation to keep going-or to rethink your approach. A 2015 study at [facility] found that daily weigh-ins helped people lose more weight, keep it off, and maintain that loss, even after two years. Use it to lose it. Step on the scale at the same time every day for the best results. If your weight shoots up several pounds from one weigh-in to the next, don't freak  out. Eating a lot of salt the night before or having your period is the likely culprit. The number should return to normal in a day or two. It's a steady climb that you need to do something about. 9. Too Much Stress and Too Little Sleep Are Your Enemies When you're tired and frazzled, your body cranks up the production of cortisol, the stress hormone that can cause carb cravings. Not getting enough sleep also boosts your levels of ghrelin, a hormone associated with hunger, while suppressing leptin, a hormone that signals fullness and satiety. People on a diet who slept only five and a half hours a  night for two weeks lost 55 percent less fat and were hungrier than those who slept eight and a half hours, according to a study in the [place]. Use it to lose it. Prioritize sleep, aiming for seven hours or more a night, which research shows helps lower stress. And make sure you're getting quality zzz's. If a snoring spouse or a fidgety cat wakes you up frequently throughout the night, you may end up getting the equivalent of just four hours of sleep, according to a study from [facility]. Keep pets out of the bedroom, and use a white-noise app to drown out snoring. 10. You Will Hit a plateau-And You Can Bust Through It As you slim down, your body releases much less leptin, the fullness hormone.  If you're not strength training, start right now. Building muscle can raise your metabolism to help you overcome a plateau. To keep your body challenged and burning calories, incorporate new moves and more intense intervals into your workouts or add another sweat session to your weekly routine. Alternatively, cut an extra 100 calories or so a day from your diet. Now that you've lost weight, your body simply doesn't need as much fuel.
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
## 2014-10-27 LAB — URINALYSIS, ROUTINE W REFLEX MICROSCOPIC
Bilirubin Urine: NEGATIVE
Glucose, UA: >1000 mg/dL — AB
Hgb urine dipstick: NEGATIVE
Ketones, ur: NEGATIVE mg/dL
Nitrite: NEGATIVE
Protein, ur: NEGATIVE mg/dL
Specific Gravity, Urine: 1.013 (ref 1.005–1.030)
Urobilinogen, UA: 0.2 mg/dL (ref 0.0–1.0)
pH: 6 (ref 5.0–8.0)

## 2014-10-27 LAB — VITAMIN D 25 HYDROXY (VIT D DEFICIENCY, FRACTURES): Vit D, 25-Hydroxy: 29 ng/mL — ABNORMAL LOW (ref 30–100)

## 2014-10-27 LAB — HEMOGLOBIN A1C
Hgb A1c MFr Bld: 9.5 % — ABNORMAL HIGH (ref <5.7)
Mean Plasma Glucose: 226 mg/dL — ABNORMAL HIGH (ref <117)

## 2014-10-27 LAB — URINALYSIS, MICROSCOPIC ONLY
Bacteria, UA: NONE SEEN
Casts: NONE SEEN
Crystals: NONE SEEN
Squamous Epithelial / LPF: NONE SEEN

## 2014-10-27 LAB — TSH: TSH: 0.774 u[IU]/mL (ref 0.350–4.500)

## 2014-10-27 LAB — URINE CULTURE
Colony Count: NO GROWTH
Organism ID, Bacteria: NO GROWTH

## 2014-11-06 DIAGNOSIS — F319 Bipolar disorder, unspecified: Secondary | ICD-10-CM | POA: Insufficient documentation

## 2014-11-15 ENCOUNTER — Other Ambulatory Visit: Payer: Self-pay | Admitting: Internal Medicine

## 2014-11-16 ENCOUNTER — Other Ambulatory Visit: Payer: Self-pay | Admitting: *Deleted

## 2014-11-16 MED ORDER — GABAPENTIN 300 MG PO CAPS: 300.0000 mg | ORAL_CAPSULE | Freq: Every evening | ORAL | Status: DC

## 2014-11-29 ENCOUNTER — Telehealth: Payer: Self-pay | Admitting: Nurse Practitioner

## 2014-11-29 NOTE — Telephone Encounter (Signed)
Patient calling to report "cyst" between rectum and vagina that feels hard, red, and painful. She states she wants to be seen. Pt reports she has been soaking in warm water and her husband used alcohol on a needle and tried to "drain" the area, noting blood and "water" output. The area remains hardened to touch despite this. Symptoms reported to Joylene John, NP and Dr. Denman George. Patient was told she would be called back with instructions.   Kim called patient back with apt 11/30/14 at 1:45 with Dr. Denman George. Per Joylene John, NP, patient instructed to continue warm water soaks and refrain from inserting objects into the tender area to drain. She verbalizes understanding of instructions and apt.

## 2014-11-30 ENCOUNTER — Ambulatory Visit: Payer: PPO | Attending: Gynecologic Oncology | Admitting: Gynecologic Oncology

## 2014-11-30 ENCOUNTER — Encounter: Payer: Self-pay | Admitting: Gynecologic Oncology

## 2014-11-30 VITALS — BP 123/73 | HR 90 | Temp 97.8°F | Resp 18 | Ht 63.75 in | Wt 180.4 lb

## 2014-11-30 DIAGNOSIS — Z90710 Acquired absence of both cervix and uterus: Secondary | ICD-10-CM | POA: Insufficient documentation

## 2014-11-30 DIAGNOSIS — F319 Bipolar disorder, unspecified: Secondary | ICD-10-CM | POA: Diagnosis not present

## 2014-11-30 DIAGNOSIS — E780 Pure hypercholesterolemia: Secondary | ICD-10-CM | POA: Insufficient documentation

## 2014-11-30 DIAGNOSIS — L0232 Furuncle of buttock: Secondary | ICD-10-CM | POA: Insufficient documentation

## 2014-11-30 DIAGNOSIS — Z08 Encounter for follow-up examination after completed treatment for malignant neoplasm: Secondary | ICD-10-CM | POA: Diagnosis not present

## 2014-11-30 DIAGNOSIS — K219 Gastro-esophageal reflux disease without esophagitis: Secondary | ICD-10-CM | POA: Insufficient documentation

## 2014-11-30 DIAGNOSIS — G629 Polyneuropathy, unspecified: Secondary | ICD-10-CM | POA: Insufficient documentation

## 2014-11-30 DIAGNOSIS — I10 Essential (primary) hypertension: Secondary | ICD-10-CM | POA: Insufficient documentation

## 2014-11-30 DIAGNOSIS — Z8544 Personal history of malignant neoplasm of other female genital organs: Secondary | ICD-10-CM | POA: Diagnosis not present

## 2014-11-30 DIAGNOSIS — J45909 Unspecified asthma, uncomplicated: Secondary | ICD-10-CM | POA: Insufficient documentation

## 2014-11-30 DIAGNOSIS — Z79899 Other long term (current) drug therapy: Secondary | ICD-10-CM | POA: Insufficient documentation

## 2014-11-30 DIAGNOSIS — Z794 Long term (current) use of insulin: Secondary | ICD-10-CM | POA: Diagnosis not present

## 2014-11-30 DIAGNOSIS — C519 Malignant neoplasm of vulva, unspecified: Secondary | ICD-10-CM

## 2014-11-30 DIAGNOSIS — Z8673 Personal history of transient ischemic attack (TIA), and cerebral infarction without residual deficits: Secondary | ICD-10-CM | POA: Diagnosis not present

## 2014-11-30 DIAGNOSIS — F42 Obsessive-compulsive disorder: Secondary | ICD-10-CM | POA: Diagnosis not present

## 2014-11-30 DIAGNOSIS — F17200 Nicotine dependence, unspecified, uncomplicated: Secondary | ICD-10-CM | POA: Diagnosis not present

## 2014-11-30 DIAGNOSIS — Z7951 Long term (current) use of inhaled steroids: Secondary | ICD-10-CM | POA: Diagnosis not present

## 2014-11-30 DIAGNOSIS — N3946 Mixed incontinence: Secondary | ICD-10-CM | POA: Insufficient documentation

## 2014-11-30 DIAGNOSIS — E785 Hyperlipidemia, unspecified: Secondary | ICD-10-CM | POA: Diagnosis not present

## 2014-11-30 DIAGNOSIS — F329 Major depressive disorder, single episode, unspecified: Secondary | ICD-10-CM | POA: Insufficient documentation

## 2014-11-30 DIAGNOSIS — E119 Type 2 diabetes mellitus without complications: Secondary | ICD-10-CM | POA: Diagnosis not present

## 2014-11-30 DIAGNOSIS — F419 Anxiety disorder, unspecified: Secondary | ICD-10-CM | POA: Insufficient documentation

## 2014-11-30 MED ORDER — AMOXICILLIN-POT CLAVULANATE 875-125 MG PO TABS: 1.0000 | ORAL_TABLET | Freq: Two times a day (BID) | ORAL | Status: DC
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
## 2014-11-30 NOTE — Patient Instructions (Signed)
Take Augmentin for five days.  Follow up as scheduled and call for any questions or concerns.

## 2014-12-05 ENCOUNTER — Telehealth: Payer: Self-pay | Admitting: *Deleted

## 2014-12-05 MED ORDER — RANITIDINE HCL 300 MG PO TABS: 300.0000 mg | ORAL_TABLET | Freq: Two times a day (BID) | ORAL | Status: DC

## 2014-12-05 NOTE — Telephone Encounter (Signed)
Patient wants to d/c Omeprazole rx and switch to Ranitidine.  Per Dr. Melford Aase, ok for patient to make switch.  Patient advised to take 1-2 Ranitidine 300 mg QD to wean off Omeprazole.  Patient expressed understanding.

## 2014-12-14 ENCOUNTER — Other Ambulatory Visit: Payer: Self-pay | Admitting: *Deleted

## 2014-12-14 MED ORDER — LOSARTAN POTASSIUM 50 MG PO TABS: 50.0000 mg | ORAL_TABLET | Freq: Two times a day (BID) | ORAL | Status: DC

## 2014-12-26 ENCOUNTER — Other Ambulatory Visit: Payer: Self-pay | Admitting: Internal Medicine

## 2015-01-01 ENCOUNTER — Other Ambulatory Visit: Payer: Self-pay | Admitting: Internal Medicine

## 2015-01-01 DIAGNOSIS — I10 Essential (primary) hypertension: Secondary | ICD-10-CM

## 2015-01-01 DIAGNOSIS — F319 Bipolar disorder, unspecified: Secondary | ICD-10-CM

## 2015-01-01 DIAGNOSIS — E1029 Type 1 diabetes mellitus with other diabetic kidney complication: Secondary | ICD-10-CM

## 2015-01-01 DIAGNOSIS — B379 Candidiasis, unspecified: Secondary | ICD-10-CM

## 2015-01-01 MED ORDER — LOSARTAN POTASSIUM 100 MG PO TABS: ORAL_TABLET | ORAL | Status: DC

## 2015-01-01 MED ORDER — FLUCONAZOLE 150 MG PO TABS: ORAL_TABLET | ORAL | Status: DC

## 2015-01-01 MED ORDER — FLUOXETINE HCL 40 MG PO CAPS: 40.0000 mg | ORAL_CAPSULE | Freq: Every day | ORAL | Status: DC

## 2015-01-05 ENCOUNTER — Other Ambulatory Visit: Payer: Self-pay

## 2015-01-05 MED ORDER — LEVOTHYROXINE SODIUM 100 MCG PO TABS: 100.0000 ug | ORAL_TABLET | Freq: Every day | ORAL | Status: DC

## 2015-01-10 ENCOUNTER — Encounter: Payer: Self-pay | Admitting: Gastroenterology

## 2015-01-29 ENCOUNTER — Other Ambulatory Visit: Payer: Self-pay | Admitting: *Deleted

## 2015-01-29 MED ORDER — ATORVASTATIN CALCIUM 40 MG PO TABS: 40.0000 mg | ORAL_TABLET | Freq: Every day | ORAL | Status: DC

## 2015-01-30 ENCOUNTER — Other Ambulatory Visit: Payer: Self-pay | Admitting: *Deleted

## 2015-01-30 MED ORDER — DAPAGLIFLOZIN PROPANEDIOL 10 MG PO TABS: ORAL_TABLET | ORAL | Status: DC

## 2015-02-05 ENCOUNTER — Telehealth: Payer: Self-pay | Admitting: *Deleted

## 2015-02-05 NOTE — Telephone Encounter (Signed)
Left message to inform patient that Dr [facility] office is able to do her diabetic eye exam.

## 2015-02-08 ENCOUNTER — Encounter (HOSPITAL_COMMUNITY): Payer: Self-pay | Admitting: Emergency Medicine

## 2015-02-08 ENCOUNTER — Emergency Department (HOSPITAL_COMMUNITY)
Admission: EM | Admit: 2015-02-08 | Discharge: 2015-02-08 | Disposition: A | Discharge status: Home / Self Care | Payer: PPO | Attending: Emergency Medicine | Admitting: Emergency Medicine

## 2015-02-08 DIAGNOSIS — E119 Type 2 diabetes mellitus without complications: Secondary | ICD-10-CM | POA: Insufficient documentation

## 2015-02-08 DIAGNOSIS — Z7951 Long term (current) use of inhaled steroids: Secondary | ICD-10-CM | POA: Diagnosis not present

## 2015-02-08 DIAGNOSIS — J45909 Unspecified asthma, uncomplicated: Secondary | ICD-10-CM | POA: Insufficient documentation

## 2015-02-08 DIAGNOSIS — Y9389 Activity, other specified: Secondary | ICD-10-CM | POA: Insufficient documentation

## 2015-02-08 DIAGNOSIS — Z720 Tobacco use: Secondary | ICD-10-CM | POA: Diagnosis not present

## 2015-02-08 DIAGNOSIS — Z8673 Personal history of transient ischemic attack (TIA), and cerebral infarction without residual deficits: Secondary | ICD-10-CM | POA: Diagnosis not present

## 2015-02-08 DIAGNOSIS — F319 Bipolar disorder, unspecified: Secondary | ICD-10-CM | POA: Diagnosis not present

## 2015-02-08 DIAGNOSIS — I10 Essential (primary) hypertension: Secondary | ICD-10-CM | POA: Insufficient documentation

## 2015-02-08 DIAGNOSIS — E780 Pure hypercholesterolemia: Secondary | ICD-10-CM | POA: Insufficient documentation

## 2015-02-08 DIAGNOSIS — Z794 Long term (current) use of insulin: Secondary | ICD-10-CM | POA: Insufficient documentation

## 2015-02-08 DIAGNOSIS — M1990 Unspecified osteoarthritis, unspecified site: Secondary | ICD-10-CM | POA: Diagnosis not present

## 2015-02-08 DIAGNOSIS — M25552 Pain in left hip: Secondary | ICD-10-CM | POA: Diagnosis present

## 2015-02-08 DIAGNOSIS — Z79899 Other long term (current) drug therapy: Secondary | ICD-10-CM | POA: Insufficient documentation

## 2015-02-08 DIAGNOSIS — R109 Unspecified abdominal pain: Secondary | ICD-10-CM | POA: Diagnosis not present

## 2015-02-08 DIAGNOSIS — K219 Gastro-esophageal reflux disease without esophagitis: Secondary | ICD-10-CM | POA: Diagnosis not present

## 2015-02-08 DIAGNOSIS — G629 Polyneuropathy, unspecified: Secondary | ICD-10-CM | POA: Insufficient documentation

## 2015-02-08 DIAGNOSIS — M7072 Other bursitis of hip, left hip: Secondary | ICD-10-CM | POA: Diagnosis not present

## 2015-02-08 MED ORDER — METHOCARBAMOL 500 MG PO TABS
500.0000 mg | ORAL_TABLET | Freq: Once | ORAL | Status: AC
  Administered 2015-02-08: 500 mg via ORAL
  Filled 2015-02-08: qty 1

## 2015-02-08 MED ORDER — METHOCARBAMOL 500 MG PO TABS: 500.0000 mg | ORAL_TABLET | Freq: Two times a day (BID) | ORAL | Status: DC | PRN

## 2015-02-08 MED ORDER — KETOROLAC TROMETHAMINE 60 MG/2ML IM SOLN
60.0000 mg | Freq: Once | INTRAMUSCULAR | Status: AC
  Administered 2015-02-08: 60 mg via INTRAMUSCULAR
  Filled 2015-02-08: qty 2

## 2015-02-08 MED ORDER — IBUPROFEN 800 MG PO TABS: 800.0000 mg | ORAL_TABLET | Freq: Three times a day (TID) | ORAL | Status: DC

## 2015-02-08 NOTE — Discharge Instructions (Signed)
Bursitis Bursitis is a swelling and soreness (inflammation) of a fluid-filled sac (bursa) that overlies and protects a joint. It can be caused by injury, overuse of the joint, arthritis or infection. The joints most likely to be affected are the elbows, shoulders, hips and knees. HOME CARE INSTRUCTIONS   Apply ice to the affected area for 15-20 minutes each hour while awake for 2 days. Put the ice in a plastic bag and place a towel between the bag of ice and your skin.  Rest the injured joint as much as possible, but continue to put the joint through a full range of motion, 4 times per day. (The shoulder joint especially becomes rapidly "frozen" if not used.) When the pain lessens, begin normal slow movements and usual activities.  Only take over-the-counter or prescription medicines for pain, discomfort or fever as directed by your caregiver.  Your caregiver may recommend draining the bursa and injecting medicine into the bursa. This may help the healing process.  Follow all instructions for follow-up with your caregiver. This includes any orthopedic referrals, physical therapy and rehabilitation. Any delay in obtaining necessary care could result in a delay or failure of the bursitis to heal and chronic pain. SEEK IMMEDIATE MEDICAL CARE IF:   Your pain increases even during treatment.  You develop an oral temperature above 102 F (38.9 C) and have heat and inflammation over the involved bursa. MAKE SURE YOU:   Understand these instructions.  Will watch your condition.  Will get help right away if you are not doing well or get worse. Document Released: 08/01/2000 Document Revised: 10/27/2011 Document Reviewed: 10/24/2013 [facility] Patient Information 2015 [place], [place]. This information is not intended to replace advice given to you by your health care provider. Make sure you discuss any questions you have with your health care provider.

## 2015-02-08 NOTE — ED Notes (Signed)
Pt sts left side pain starting in hip and going up side and down leg x 2 days
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
## 2015-02-09 ENCOUNTER — Ambulatory Visit: Payer: Self-pay | Admitting: Internal Medicine

## 2015-02-20 ENCOUNTER — Ambulatory Visit (INDEPENDENT_AMBULATORY_CARE_PROVIDER_SITE_OTHER): Payer: PPO | Admitting: Internal Medicine

## 2015-02-20 ENCOUNTER — Encounter: Payer: Self-pay | Admitting: Internal Medicine

## 2015-02-20 VITALS — BP 116/62 | HR 106 | Temp 98.0°F | Resp 18 | Ht 63.75 in | Wt 182.0 lb

## 2015-02-20 DIAGNOSIS — K219 Gastro-esophageal reflux disease without esophagitis: Secondary | ICD-10-CM

## 2015-02-20 DIAGNOSIS — E1029 Type 1 diabetes mellitus with other diabetic kidney complication: Secondary | ICD-10-CM

## 2015-02-20 DIAGNOSIS — I10 Essential (primary) hypertension: Secondary | ICD-10-CM

## 2015-02-20 DIAGNOSIS — Z9114 Patient's other noncompliance with medication regimen: Secondary | ICD-10-CM

## 2015-02-20 DIAGNOSIS — Z91199 Patient's noncompliance with other medical treatment and regimen due to unspecified reason: Secondary | ICD-10-CM

## 2015-02-20 DIAGNOSIS — M25552 Pain in left hip: Secondary | ICD-10-CM

## 2015-02-20 DIAGNOSIS — E782 Mixed hyperlipidemia: Secondary | ICD-10-CM

## 2015-02-20 DIAGNOSIS — Z9119 Patient's noncompliance with other medical treatment and regimen: Secondary | ICD-10-CM

## 2015-02-20 DIAGNOSIS — E559 Vitamin D deficiency, unspecified: Secondary | ICD-10-CM

## 2015-02-20 DIAGNOSIS — Z91148 Patient's other noncompliance with medication regimen for other reason: Secondary | ICD-10-CM | POA: Insufficient documentation

## 2015-02-20 DIAGNOSIS — Z79899 Other long term (current) drug therapy: Secondary | ICD-10-CM

## 2015-02-20 DIAGNOSIS — E669 Obesity, unspecified: Secondary | ICD-10-CM

## 2015-02-20 MED ORDER — PREDNISONE 20 MG PO TABS: ORAL_TABLET | ORAL | Status: DC

## 2015-02-20 MED ORDER — OMEPRAZOLE 20 MG PO TBEC: 20.0000 mg | DELAYED_RELEASE_TABLET | Freq: Every day | ORAL | Status: DC

## 2015-02-20 MED ORDER — ALBUTEROL SULFATE HFA 108 (90 BASE) MCG/ACT IN AERS: 2.0000 | INHALATION_SPRAY | Freq: Four times a day (QID) | RESPIRATORY_TRACT | Status: DC | PRN

## 2015-02-20 MED ORDER — SUCRALFATE 1 G PO TABS: 1.0000 g | ORAL_TABLET | Freq: Four times a day (QID) | ORAL | Status: DC

## 2015-02-20 NOTE — Progress Notes (Signed)
Assessment and Plan:  Hypertension:  -Continue medication -monitor blood pressure at home. -Continue DASH diet -Reminder to go to the ER if any CP, SOB, nausea, dizziness, severe HA, changes vision/speech, left arm numbness and tingling and jaw pain.  Cholesterol - Continue diet and exercise -Check cholesterol.   Diabetes with diabetic chronic kidney disease -Continue diet and exercise.  -Check A1C  Vitamin D Def -check level -continue medications.   Bilateral Hip pain -prednisone -continue ibuprofen -consider physical therapy  GERD -omeprazole for 2 weeks -try carafate  Continue diet and meds as discussed. Further disposition pending results of labs. Discussed med's effects and SE's.    HPI 69 y.o. female  presents for 3 month follow up with hypertension, hyperlipidemia, diabetes and vitamin D deficiency.   Her blood pressure has been controlled at home, today their BP is BP: 116/62 mmHg.She does not workout. She denies chest pain, shortness of breath, dizziness.   She is on cholesterol medication and denies myalgias. Her cholesterol is at goal. The cholesterol was:  10/26/2014: Cholesterol 150; HDL 33*; LDL Cholesterol 65; Triglycerides 258*   She has been working on diet and exercise for diabetes with diabetic chronic kidney disease, she is not on bASA, she is on ACE/ARB, and denies  foot ulcerations, hyperglycemia, hypoglycemia , increased appetite, nausea, paresthesia of the feet, polydipsia, polyuria, visual disturbances, vomiting and weight loss. Last A1C was: 10/26/2014: Hgb A1c MFr Bld 9.5*.  Patient repors that she has been non-compliant with her diet.     Patient is on Vitamin D supplement. 10/26/2014: Vit D, 25-Hydroxy 29*  Patient also here for follow-up of ER visit.  Patient went to the ER for 2 days of left hip pain.  Physical examination at the ER per ER doc likely due to bursitis.  Patient treated there with 800 mg ibuprofen and methocarbamol .  Patient reports  that hip is feeling a little better.  She reports that she has been having a hard time doing anything because the methacarbomal has made it really hard for her to walk and to drive because she is too tired.    Patient feels like she has had a lot of gas and indigestion.  She reports that the she has been taking ranitidine, and does not feel like this works as well as the omeprazole.     Current Medications:  Current Outpatient Prescriptions on File Prior to Visit  Medication Sig Dispense Refill  . albuterol (PROVENTIL HFA;VENTOLIN HFA) 108 (90 BASE) MCG/ACT inhaler Inhale 2 puffs into the lungs every 6 (six) hours as needed for wheezing or shortness of breath. 1 Inhaler 2  . allopurinol (ZYLOPRIM) 300 MG tablet Take 1 tablet (300 mg total) by mouth every morning. 90 tablet 2  . atorvastatin (LIPITOR) 40 MG tablet Take 1 tablet (40 mg total) by mouth daily. 90 tablet 2  . chlorhexidine (PERIDEX) 0.12 % solution   0  . clonazePAM (KLONOPIN) 2 MG tablet Take 1 tablet (2 mg total) by mouth 2 (two) times daily. 60 tablet 0  . DiphenhydrAMINE HCl, Sleep, ([facility] SLEEP AID NIGHTTIME PO) Take 2 tablets by mouth at bedtime.    Marland Kitchen erythromycin with ethanol (EMGEL) 2 % gel Apply topically 2 (two) times daily. 30 g 2  . fenofibrate (TRICOR) 145 MG tablet Take 1 tablet (145 mg total) by mouth daily. 90 tablet 4  . fluconazole (DIFLUCAN) 150 MG tablet Take 1 tablet weekly for yeast infection 4 tablet 99  . FLUoxetine (PROZAC) 40  MG capsule Take 1 capsule (40 mg total) by mouth daily. 90 capsule 3  . gabapentin (NEURONTIN) 300 MG capsule Take 1 capsule (300 mg total) by mouth every evening. 90 capsule 1  . hydrochlorothiazide (HYDRODIURIL) 25 MG tablet Take 1 tablet (25 mg total) by mouth every other day. 90 tablet 4  . hydrocortisone cream 1 % Apply to affected area 2 times daily 15 g 0  . ibuprofen (ADVIL,MOTRIN) 800 MG tablet Take 1 tablet (800 mg total) by mouth 3 (three) times daily. 21 tablet 0  .  levothyroxine (SYNTHROID, LEVOTHROID) 100 MCG tablet Take 1 tablet (100 mcg total) by mouth daily. 90 tablet 3  . losartan (COZAAR) 100 MG tablet Take 1 tablet daily for BP & Kidney Protection 90 tablet 99  . metFORMIN (GLUCOPHAGE) 1000 MG tablet Take 1 tablet (1,000 mg total) by mouth 2 (two) times daily with a meal. 1 tablet twice daily 180 tablet 3  . methocarbamol (ROBAXIN) 500 MG tablet Take 1 tablet (500 mg total) by mouth 2 (two) times daily as needed for muscle spasms. 20 tablet 0  . NOVOLIN N 100 UNIT/ML injection INJECT 50 UNITS EVERY MORNING AND 25 UNITS EVERY EVENING 30 mL 2  . Omeprazole 20 MG TBEC Take 20 mg by mouth daily.    . polyethylene glycol (MIRALAX / GLYCOLAX) packet Take 17 g by mouth as needed.     . ranitidine (ZANTAC) 300 MG tablet Take 1 tablet (300 mg total) by mouth 2 (two) times daily. 180 tablet 3   No current facility-administered medications on file prior to visit.   Medical History:  Past Medical History  Diagnosis Date  . Diabetes mellitus   . Asthma   . Hypertension   . Hypercholesteremia   . Gout LEFT FOOT-  STABLE  . Neuropathy of lower extremity   . Bipolar 1 disorder   . OCD (obsessive compulsive disorder)   . Depression   . SOB (shortness of breath) on exertion   . GERD (gastroesophageal reflux disease)   . H/O hiatal hernia   . SUI (stress urinary incontinence, female)   . Arthritis LOWER BACK  . History of CVA (cerebrovascular accident) FOUND PER MRI 1994--  RESIDUAL MEMORY IMPAIRED  . Vulvar lesion   . Anxiety disorder   . Carpal tunnel syndrome   . Peripheral neuropathy   . Dyslipidemia   . Gait disorder    Allergies:  Allergies  Allergen Reactions  . Sulfa Antibiotics Other (See Comments)    Pt states she had "extreme pain"  . Sulfacetamide Sodium      Review of Systems:  Review of Systems  Constitutional: Negative for fever, chills and malaise/fatigue.  HENT: Negative for congestion, ear discharge and sore throat.   Eyes:  Negative.   Respiratory: Negative for cough, shortness of breath and wheezing.   Cardiovascular: Negative for chest pain, palpitations and leg swelling.  Gastrointestinal: Negative for heartburn, nausea, vomiting, diarrhea, constipation, blood in stool and melena.  Genitourinary: Negative.        Difficulty with voiding  Musculoskeletal: Positive for joint pain.  Skin: Negative.   Neurological: Negative for dizziness, sensory change, loss of consciousness and headaches.  Psychiatric/Behavioral: Positive for depression. Negative for suicidal ideas. The patient is nervous/anxious. The patient does not have insomnia.     Family history- Review and unchanged  Social history- Review and unchanged  Physical Exam: BP 116/62 mmHg  Pulse 106  Temp(Src) 98 F (36.7 C) (Temporal)  Resp 18  Ht 5' 3.75" (1.619 m)  Wt 182 lb (82.555 kg)  BMI 31.50 kg/m2 Wt Readings from Last 3 Encounters:  02/20/15 182 lb (82.555 kg)  02/08/15 180 lb (81.647 kg)  11/30/14 180 lb 6.4 oz (81.829 kg)   General Appearance: Well nourished well developed, non-toxic appearing, in no apparent distress.  Patient has difficulty staying awake.  Questionable smell of possible  alcohol Eyes: PERRLA, EOMs, conjunctiva no swelling or erythema ENT/Mouth: Ear canals clear with no erythema, swelling, or discharge.  TMs normal bilaterally, oropharynx clear, moist, with no exudate.   Neck: Supple, thyroid normal, no JVD, no cervical adenopathy.  Respiratory: Respiratory effort normal, breath sounds clear A&P, no wheeze, rhonchi or rales noted.  No retractions, no accessory muscle usage Cardio: RRR with no MRGs. No noted edema.  Abdomen: Soft, + BS.  Non tender, no guarding, rebound, hernias, masses. Musculoskeletal: Full ROM, 5/5 strength, antalgic and unsteady gait with a walker Skin: Warm, dry without rashes, lesions, ecchymosis.  Neuro: Awake and oriented X 3, Cranial nerves intact. No cerebellar symptoms.  Psych: normal  affect, Insight and Judgment appropriate.    FORCUCCI, Gurnoor Ursua, PA-C 3:13 PM [facility]

## 2015-02-20 NOTE — Patient Instructions (Addendum)
-  Please stop taking methacarbamol or robaxin as I feel that this is likely contributing to your falls.   -Please use the erythromycin ointment on your arms.   -Please take the prednisone tablets until it is gone.  If your hips are no feeling better than we likely need to send you to get some physical therapy and some further injections. -Please make sure that you are stopping smoking.  Please avoid starches and white foods.  You want to eat as much veggies and fruits as you can.  You can eat animal meats but sparingly.  If you can please cut your clonazepam use back as much as you can.   -you can use carafate to help with your indigestion.  Try taking it 3 times daily with your food.  You can use omeprazole 20 mg daily for 2 weeks.

## 2015-02-21 LAB — HEMOGLOBIN A1C
Hgb A1c MFr Bld: 11.2 % — ABNORMAL HIGH (ref <5.7)
Mean Plasma Glucose: 275 mg/dL — ABNORMAL HIGH (ref <117)

## 2015-02-21 LAB — HEPATIC FUNCTION PANEL
ALT: 21 U/L (ref 0–35)
AST: 14 U/L (ref 0–37)
Albumin: 4.2 g/dL (ref 3.5–5.2)
Alkaline Phosphatase: 45 U/L (ref 39–117)
Bilirubin, Direct: 0.1 mg/dL (ref 0.0–0.3)
Indirect Bilirubin: 0.3 mg/dL (ref 0.2–1.2)
Total Bilirubin: 0.4 mg/dL (ref 0.2–1.2)
Total Protein: 6.7 g/dL (ref 6.0–8.3)

## 2015-02-21 LAB — CBC WITH DIFFERENTIAL/PLATELET
Basophils Absolute: 0.1 10*3/uL (ref 0.0–0.1)
Basophils Relative: 1 % (ref 0–1)
Eosinophils Absolute: 0.2 10*3/uL (ref 0.0–0.7)
Eosinophils Relative: 2 % (ref 0–5)
HCT: 42.4 % (ref 36.0–46.0)
Hemoglobin: 13.8 g/dL (ref 12.0–15.0)
Lymphocytes Relative: 27 % (ref 12–46)
Lymphs Abs: 2.3 10*3/uL (ref 0.7–4.0)
MCH: 29.6 pg (ref 26.0–34.0)
MCHC: 32.5 g/dL (ref 30.0–36.0)
MCV: 91 fL (ref 78.0–100.0)
MPV: 10.2 fL (ref 8.6–12.4)
Monocytes Absolute: 0.4 10*3/uL (ref 0.1–1.0)
Monocytes Relative: 5 % (ref 3–12)
Neutro Abs: 5.7 10*3/uL (ref 1.7–7.7)
Neutrophils Relative %: 65 % (ref 43–77)
Platelets: 376 10*3/uL (ref 150–400)
RBC: 4.66 MIL/uL (ref 3.87–5.11)
RDW: 14.7 % (ref 11.5–15.5)
WBC: 8.7 10*3/uL (ref 4.0–10.5)

## 2015-02-21 LAB — BASIC METABOLIC PANEL WITH GFR
BUN: 26 mg/dL — ABNORMAL HIGH (ref 6–23)
CO2: 26 mEq/L (ref 19–32)
Calcium: 10.4 mg/dL (ref 8.4–10.5)
Chloride: 91 mEq/L — ABNORMAL LOW (ref 96–112)
Creat: 1.16 mg/dL — ABNORMAL HIGH (ref 0.50–1.10)
GFR, Est African American: 56 mL/min — ABNORMAL LOW
GFR, Est Non African American: 49 mL/min — ABNORMAL LOW
Glucose, Bld: 376 mg/dL — ABNORMAL HIGH (ref 70–99)
Potassium: 4.3 mEq/L (ref 3.5–5.3)
Sodium: 132 mEq/L — ABNORMAL LOW (ref 135–145)

## 2015-02-21 LAB — LIPID PANEL
Cholesterol: 195 mg/dL (ref 0–200)
HDL: 34 mg/dL — ABNORMAL LOW (ref >=46)
Total CHOL/HDL Ratio: 5.7 Ratio
Triglycerides: 527 mg/dL — ABNORMAL HIGH (ref <150)

## 2015-02-21 LAB — INSULIN, RANDOM: Insulin: 19.4 u[IU]/mL (ref 2.0–19.6)

## 2015-02-21 LAB — VITAMIN D 25 HYDROXY (VIT D DEFICIENCY, FRACTURES): Vit D, 25-Hydroxy: 19 ng/mL — ABNORMAL LOW (ref 30–100)

## 2015-02-21 LAB — TSH: TSH: 1.454 u[IU]/mL (ref 0.350–4.500)

## 2015-02-21 LAB — MAGNESIUM: Magnesium: 1.2 mg/dL — ABNORMAL LOW (ref 1.5–2.5)

## 2015-02-22 ENCOUNTER — Other Ambulatory Visit: Payer: Self-pay | Admitting: *Deleted

## 2015-02-22 ENCOUNTER — Other Ambulatory Visit: Payer: Self-pay | Admitting: Internal Medicine

## 2015-02-22 MED ORDER — DAPAGLIFLOZIN PROPANEDIOL 10 MG PO TABS: 10.0000 mg | ORAL_TABLET | Freq: Every day | ORAL | Status: DC

## 2015-02-23 ENCOUNTER — Other Ambulatory Visit: Payer: Self-pay | Admitting: Internal Medicine

## 2015-02-26 ENCOUNTER — Other Ambulatory Visit: Payer: Self-pay | Admitting: *Deleted

## 2015-02-26 MED ORDER — CANAGLIFLOZIN 300 MG PO TABS: 300.0000 mg | ORAL_TABLET | Freq: Every day | ORAL | Status: DC

## 2015-02-26 NOTE — Telephone Encounter (Signed)
[facility] Rx was not covered by [employer].  Was given Invokana 300 mg samples to try and if patient does ok then we will send in Rx for pharmacy for patient.

## 2015-03-06 ENCOUNTER — Other Ambulatory Visit: Payer: Self-pay

## 2015-03-06 MED ORDER — ALLOPURINOL 300 MG PO TABS: 300.0000 mg | ORAL_TABLET | Freq: Every morning | ORAL | Status: DC

## 2015-03-07 ENCOUNTER — Encounter: Payer: Self-pay | Admitting: Internal Medicine

## 2015-03-07 ENCOUNTER — Ambulatory Visit (INDEPENDENT_AMBULATORY_CARE_PROVIDER_SITE_OTHER): Payer: PPO | Admitting: Internal Medicine

## 2015-03-07 VITALS — BP 96/60 | HR 56 | Temp 97.5°F | Resp 16 | Ht 63.5 in | Wt 183.2 lb

## 2015-03-07 DIAGNOSIS — E1022 Type 1 diabetes mellitus with diabetic chronic kidney disease: Secondary | ICD-10-CM

## 2015-03-07 DIAGNOSIS — N183 Chronic kidney disease, stage 3 unspecified: Secondary | ICD-10-CM

## 2015-03-07 DIAGNOSIS — E1065 Type 1 diabetes mellitus with hyperglycemia: Principal | ICD-10-CM

## 2015-03-07 DIAGNOSIS — Z6831 Body mass index (BMI) 31.0-31.9, adult: Secondary | ICD-10-CM

## 2015-03-07 DIAGNOSIS — I10 Essential (primary) hypertension: Secondary | ICD-10-CM

## 2015-03-07 DIAGNOSIS — Z79899 Other long term (current) drug therapy: Secondary | ICD-10-CM

## 2015-03-07 DIAGNOSIS — IMO0002 Reserved for concepts with insufficient information to code with codable children: Secondary | ICD-10-CM
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
## 2015-03-07 NOTE — Patient Instructions (Signed)

## 2015-04-12 ENCOUNTER — Ambulatory Visit (INDEPENDENT_AMBULATORY_CARE_PROVIDER_SITE_OTHER): Payer: PPO | Admitting: Internal Medicine

## 2015-04-12 ENCOUNTER — Encounter: Payer: Self-pay | Admitting: Internal Medicine

## 2015-04-12 VITALS — BP 122/80 | HR 88 | Temp 97.2°F | Resp 16 | Ht 63.5 in | Wt 184.6 lb

## 2015-04-12 DIAGNOSIS — I10 Essential (primary) hypertension: Secondary | ICD-10-CM | POA: Diagnosis not present

## 2015-04-12 DIAGNOSIS — F3130 Bipolar disorder, current episode depressed, mild or moderate severity, unspecified: Secondary | ICD-10-CM

## 2015-04-12 DIAGNOSIS — E1029 Type 1 diabetes mellitus with other diabetic kidney complication: Secondary | ICD-10-CM | POA: Diagnosis not present

## 2015-04-12 DIAGNOSIS — Z91148 Patient's other noncompliance with medication regimen for other reason: Secondary | ICD-10-CM

## 2015-04-12 DIAGNOSIS — Z9114 Patient's other noncompliance with medication regimen: Secondary | ICD-10-CM | POA: Diagnosis not present

## 2015-04-12 DIAGNOSIS — E782 Mixed hyperlipidemia: Secondary | ICD-10-CM

## 2015-04-12 DIAGNOSIS — F319 Bipolar disorder, unspecified: Secondary | ICD-10-CM
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
## 2015-05-23 ENCOUNTER — Ambulatory Visit (INDEPENDENT_AMBULATORY_CARE_PROVIDER_SITE_OTHER): Payer: PPO | Admitting: Internal Medicine

## 2015-05-23 ENCOUNTER — Encounter: Payer: Self-pay | Admitting: Internal Medicine

## 2015-05-23 VITALS — BP 122/78 | HR 76 | Temp 97.8°F | Resp 16 | Ht 63.5 in | Wt 186.8 lb

## 2015-05-23 DIAGNOSIS — R300 Dysuria: Secondary | ICD-10-CM

## 2015-05-23 DIAGNOSIS — Z794 Long term (current) use of insulin: Secondary | ICD-10-CM | POA: Diagnosis not present

## 2015-05-23 DIAGNOSIS — E119 Type 2 diabetes mellitus without complications: Secondary | ICD-10-CM

## 2015-05-23 DIAGNOSIS — I10 Essential (primary) hypertension: Secondary | ICD-10-CM

## 2015-05-23 DIAGNOSIS — Z9114 Patient's other noncompliance with medication regimen: Secondary | ICD-10-CM

## 2015-05-23 DIAGNOSIS — Z6832 Body mass index (BMI) 32.0-32.9, adult: Secondary | ICD-10-CM | POA: Diagnosis not present

## 2015-05-23 DIAGNOSIS — E1165 Type 2 diabetes mellitus with hyperglycemia: Secondary | ICD-10-CM | POA: Diagnosis not present

## 2015-05-23 DIAGNOSIS — Z79899 Other long term (current) drug therapy: Secondary | ICD-10-CM | POA: Diagnosis not present

## 2015-05-23 DIAGNOSIS — E782 Mixed hyperlipidemia: Secondary | ICD-10-CM

## 2015-05-23 DIAGNOSIS — E559 Vitamin D deficiency, unspecified: Secondary | ICD-10-CM | POA: Diagnosis not present

## 2015-05-23 DIAGNOSIS — M542 Cervicalgia: Secondary | ICD-10-CM

## 2015-05-23 LAB — CBC WITH DIFFERENTIAL/PLATELET
Basophils Absolute: 0.1 10*3/uL (ref 0.0–0.1)
Basophils Relative: 1 % (ref 0–1)
Eosinophils Absolute: 0.2 10*3/uL (ref 0.0–0.7)
Eosinophils Relative: 3 % (ref 0–5)
HCT: 38.3 % (ref 36.0–46.0)
Hemoglobin: 13.2 g/dL (ref 12.0–15.0)
Lymphocytes Relative: 23 % (ref 12–46)
Lymphs Abs: 1.8 10*3/uL (ref 0.7–4.0)
MCH: 30.5 pg (ref 26.0–34.0)
MCHC: 34.5 g/dL (ref 30.0–36.0)
MCV: 88.5 fL (ref 78.0–100.0)
MPV: 9.6 fL (ref 8.6–12.4)
Monocytes Absolute: 0.4 10*3/uL (ref 0.1–1.0)
Monocytes Relative: 5 % (ref 3–12)
Neutro Abs: 5.2 10*3/uL (ref 1.7–7.7)
Neutrophils Relative %: 68 % (ref 43–77)
Platelets: 301 10*3/uL (ref 150–400)
RBC: 4.33 MIL/uL (ref 3.87–5.11)
RDW: 13.8 % (ref 11.5–15.5)
WBC: 7.7 10*3/uL (ref 4.0–10.5)

## 2015-05-23 LAB — HEPATIC FUNCTION PANEL
ALT: 23 U/L (ref 6–29)
AST: 25 U/L (ref 10–35)
Albumin: 4.3 g/dL (ref 3.6–5.1)
Alkaline Phosphatase: 37 U/L (ref 33–130)
Bilirubin, Direct: 0.1 mg/dL (ref <=0.2)
Indirect Bilirubin: 0.3 mg/dL (ref 0.2–1.2)
Total Bilirubin: 0.4 mg/dL (ref 0.2–1.2)
Total Protein: 6.6 g/dL (ref 6.1–8.1)

## 2015-05-23 LAB — LIPID PANEL
Cholesterol: 156 mg/dL (ref 125–200)
HDL: 35 mg/dL — ABNORMAL LOW (ref >=46)
Total CHOL/HDL Ratio: 4.5 Ratio (ref <=5.0)
Triglycerides: 461 mg/dL — ABNORMAL HIGH (ref <150)

## 2015-05-23 LAB — BASIC METABOLIC PANEL WITH GFR
BUN: 22 mg/dL (ref 7–25)
CO2: 27 mmol/L (ref 20–31)
Calcium: 9.8 mg/dL (ref 8.6–10.4)
Chloride: 91 mmol/L — ABNORMAL LOW (ref 98–110)
Creat: 1.22 mg/dL — ABNORMAL HIGH (ref 0.50–0.99)
GFR, Est African American: 53 mL/min — ABNORMAL LOW (ref >=60)
GFR, Est Non African American: 46 mL/min — ABNORMAL LOW (ref >=60)
Glucose, Bld: 268 mg/dL — ABNORMAL HIGH (ref 65–99)
Potassium: 4.7 mmol/L (ref 3.5–5.3)
Sodium: 130 mmol/L — ABNORMAL LOW (ref 135–146)

## 2015-05-23 LAB — TSH: TSH: 1.877 u[IU]/mL (ref 0.350–4.500)

## 2015-05-23 LAB — MAGNESIUM: Magnesium: 1 mg/dL — ABNORMAL LOW (ref 1.5–2.5)
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
## 2015-05-23 NOTE — Patient Instructions (Signed)

## 2015-05-24 LAB — VITAMIN D 25 HYDROXY (VIT D DEFICIENCY, FRACTURES): Vit D, 25-Hydroxy: 31 ng/mL (ref 30–100)

## 2015-05-24 LAB — URINALYSIS, ROUTINE W REFLEX MICROSCOPIC
Bilirubin Urine: NEGATIVE
Hgb urine dipstick: NEGATIVE
Ketones, ur: NEGATIVE
Leukocytes, UA: NEGATIVE
Nitrite: NEGATIVE
Protein, ur: NEGATIVE
Specific Gravity, Urine: 1.01 (ref 1.001–1.035)
pH: 5.5 (ref 5.0–8.0)

## 2015-05-24 LAB — HEMOGLOBIN A1C
Hgb A1c MFr Bld: 11.1 % — ABNORMAL HIGH (ref <5.7)
Mean Plasma Glucose: 272 mg/dL — ABNORMAL HIGH (ref <117)

## 2015-05-25 ENCOUNTER — Other Ambulatory Visit: Payer: Self-pay | Admitting: Internal Medicine

## 2015-05-25 LAB — URINE CULTURE: Colony Count: 10000

## 2015-05-29 ENCOUNTER — Other Ambulatory Visit: Payer: Self-pay | Admitting: *Deleted

## 2015-05-29 MED ORDER — GABAPENTIN 100 MG PO CAPS
100.0000 mg | ORAL_CAPSULE | Freq: Every morning | ORAL | Status: DC
Start: 2015-05-29 — End: 2015-12-15

## 2015-06-11 ENCOUNTER — Other Ambulatory Visit: Payer: Self-pay | Admitting: Internal Medicine

## 2015-06-28 ENCOUNTER — Other Ambulatory Visit: Payer: Self-pay | Admitting: Internal Medicine

## 2015-07-27 ENCOUNTER — Other Ambulatory Visit: Payer: Self-pay | Admitting: *Deleted

## 2015-07-27 DIAGNOSIS — B379 Candidiasis, unspecified: Secondary | ICD-10-CM

## 2015-07-27 MED ORDER — FLUCONAZOLE 150 MG PO TABS
ORAL_TABLET | ORAL | Status: DC
Start: 2015-07-27 — End: 2016-04-08

## 2015-08-14 ENCOUNTER — Encounter: Payer: Self-pay | Admitting: Internal Medicine

## 2015-08-29 ENCOUNTER — Encounter: Payer: Self-pay | Admitting: Internal Medicine

## 2015-08-29 ENCOUNTER — Ambulatory Visit (INDEPENDENT_AMBULATORY_CARE_PROVIDER_SITE_OTHER): Payer: PPO | Admitting: Internal Medicine

## 2015-08-29 VITALS — BP 136/80 | HR 76 | Temp 97.4°F | Resp 16 | Ht 63.5 in | Wt 183.7 lb

## 2015-08-29 DIAGNOSIS — D509 Iron deficiency anemia, unspecified: Secondary | ICD-10-CM | POA: Diagnosis not present

## 2015-08-29 DIAGNOSIS — D518 Other vitamin B12 deficiency anemias: Secondary | ICD-10-CM | POA: Diagnosis not present

## 2015-08-29 DIAGNOSIS — F319 Bipolar disorder, unspecified: Secondary | ICD-10-CM

## 2015-08-29 DIAGNOSIS — M1000 Idiopathic gout, unspecified site: Secondary | ICD-10-CM

## 2015-08-29 DIAGNOSIS — E785 Hyperlipidemia, unspecified: Secondary | ICD-10-CM

## 2015-08-29 DIAGNOSIS — E119 Type 2 diabetes mellitus without complications: Secondary | ICD-10-CM | POA: Diagnosis not present

## 2015-08-29 DIAGNOSIS — M109 Gout, unspecified: Secondary | ICD-10-CM | POA: Insufficient documentation

## 2015-08-29 DIAGNOSIS — Z794 Long term (current) use of insulin: Secondary | ICD-10-CM | POA: Diagnosis not present

## 2015-08-29 DIAGNOSIS — Z0000 Encounter for general adult medical examination without abnormal findings: Secondary | ICD-10-CM | POA: Diagnosis not present

## 2015-08-29 DIAGNOSIS — Z1212 Encounter for screening for malignant neoplasm of rectum: Secondary | ICD-10-CM

## 2015-08-29 DIAGNOSIS — Z79899 Other long term (current) drug therapy: Secondary | ICD-10-CM | POA: Diagnosis not present

## 2015-08-29 DIAGNOSIS — R6889 Other general symptoms and signs: Secondary | ICD-10-CM | POA: Diagnosis not present

## 2015-08-29 DIAGNOSIS — J438 Other emphysema: Secondary | ICD-10-CM

## 2015-08-29 DIAGNOSIS — Z9114 Patient's other noncompliance with medication regimen: Secondary | ICD-10-CM

## 2015-08-29 DIAGNOSIS — E559 Vitamin D deficiency, unspecified: Secondary | ICD-10-CM | POA: Diagnosis not present

## 2015-08-29 DIAGNOSIS — Z0001 Encounter for general adult medical examination with abnormal findings: Secondary | ICD-10-CM | POA: Diagnosis not present

## 2015-08-29 DIAGNOSIS — I10 Essential (primary) hypertension: Secondary | ICD-10-CM

## 2015-08-29 LAB — CBC WITH DIFFERENTIAL/PLATELET
Basophils Absolute: 0 10*3/uL (ref 0.0–0.1)
Basophils Relative: 0 % (ref 0–1)
Eosinophils Absolute: 0.3 10*3/uL (ref 0.0–0.7)
Eosinophils Relative: 3 % (ref 0–5)
HCT: 39.5 % (ref 36.0–46.0)
Hemoglobin: 13.6 g/dL (ref 12.0–15.0)
Lymphocytes Relative: 26 % (ref 12–46)
Lymphs Abs: 2.5 10*3/uL (ref 0.7–4.0)
MCH: 30.4 pg (ref 26.0–34.0)
MCHC: 34.4 g/dL (ref 30.0–36.0)
MCV: 88.2 fL (ref 78.0–100.0)
MPV: 9.8 fL (ref 8.6–12.4)
Monocytes Absolute: 0.6 10*3/uL (ref 0.1–1.0)
Monocytes Relative: 6 % (ref 3–12)
Neutro Abs: 6.2 10*3/uL (ref 1.7–7.7)
Neutrophils Relative %: 65 % (ref 43–77)
Platelets: 287 10*3/uL (ref 150–400)
RBC: 4.48 MIL/uL (ref 3.87–5.11)
RDW: 14.4 % (ref 11.5–15.5)
WBC: 9.5 10*3/uL (ref 4.0–10.5)

## 2015-08-29 LAB — HEPATIC FUNCTION PANEL
ALT: 22 U/L (ref 6–29)
AST: 16 U/L (ref 10–35)
Albumin: 3.9 g/dL (ref 3.6–5.1)
Alkaline Phosphatase: 65 U/L (ref 33–130)
Bilirubin, Direct: 0.1 mg/dL (ref <=0.2)
Indirect Bilirubin: 0.3 mg/dL (ref 0.2–1.2)
Total Bilirubin: 0.4 mg/dL (ref 0.2–1.2)
Total Protein: 6.3 g/dL (ref 6.1–8.1)

## 2015-08-29 LAB — LIPID PANEL
Cholesterol: 147 mg/dL (ref 125–200)
HDL: 28 mg/dL — ABNORMAL LOW (ref >=46)
LDL Cholesterol: 43 mg/dL (ref <130)
Total CHOL/HDL Ratio: 5.3 Ratio — ABNORMAL HIGH (ref <=5.0)
Triglycerides: 379 mg/dL — ABNORMAL HIGH (ref <150)
VLDL: 76 mg/dL — ABNORMAL HIGH (ref <30)

## 2015-08-29 LAB — IRON AND TIBC
%SAT: 17 % (ref 11–50)
Iron: 49 ug/dL (ref 45–160)
TIBC: 284 ug/dL (ref 250–450)
UIBC: 235 ug/dL (ref 125–400)

## 2015-08-29 LAB — BASIC METABOLIC PANEL WITH GFR
BUN: 16 mg/dL (ref 7–25)
CO2: 27 mmol/L (ref 20–31)
Calcium: 9.8 mg/dL (ref 8.6–10.4)
Chloride: 95 mmol/L — ABNORMAL LOW (ref 98–110)
Creat: 1.16 mg/dL — ABNORMAL HIGH (ref 0.50–0.99)
GFR, Est African American: 56 mL/min — ABNORMAL LOW (ref >=60)
GFR, Est Non African American: 48 mL/min — ABNORMAL LOW (ref >=60)
Glucose, Bld: 172 mg/dL — ABNORMAL HIGH (ref 65–99)
Potassium: 3.4 mmol/L — ABNORMAL LOW (ref 3.5–5.3)
Sodium: 134 mmol/L — ABNORMAL LOW (ref 135–146)

## 2015-08-29 LAB — MAGNESIUM: Magnesium: 1.1 mg/dL — ABNORMAL LOW (ref 1.5–2.5)

## 2015-08-29 LAB — URIC ACID: Uric Acid, Serum: 2.5 mg/dL (ref 2.4–7.0)

## 2015-08-29 NOTE — Patient Instructions (Signed)
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
## 2015-08-30 ENCOUNTER — Encounter: Payer: Self-pay | Admitting: Internal Medicine

## 2015-08-30 LAB — URINALYSIS, ROUTINE W REFLEX MICROSCOPIC
Bilirubin Urine: NEGATIVE
Hgb urine dipstick: NEGATIVE
Ketones, ur: NEGATIVE
Leukocytes, UA: NEGATIVE
Nitrite: NEGATIVE
Protein, ur: NEGATIVE
Specific Gravity, Urine: 1.005 (ref 1.001–1.035)
pH: 5.5 (ref 5.0–8.0)

## 2015-08-30 LAB — MICROALBUMIN / CREATININE URINE RATIO
Creatinine, Urine: 26 mg/dL (ref 20–320)
Microalb Creat Ratio: 327 mcg/mg creat — ABNORMAL HIGH (ref <30)
Microalb, Ur: 8.5 mg/dL

## 2015-08-30 LAB — TSH: TSH: 0.671 u[IU]/mL (ref 0.350–4.500)

## 2015-08-30 LAB — VITAMIN B12: Vitamin B-12: 511 pg/mL (ref 211–911)

## 2015-08-30 LAB — HEMOGLOBIN A1C
Hgb A1c MFr Bld: 10.2 % — ABNORMAL HIGH (ref <5.7)
Mean Plasma Glucose: 246 mg/dL — ABNORMAL HIGH (ref <117)

## 2015-08-30 LAB — VITAMIN D 25 HYDROXY (VIT D DEFICIENCY, FRACTURES): Vit D, 25-Hydroxy: 52 ng/mL (ref 30–100)

## 2015-09-03 ENCOUNTER — Other Ambulatory Visit: Payer: Self-pay | Admitting: Internal Medicine

## 2015-10-01 ENCOUNTER — Other Ambulatory Visit: Payer: Self-pay | Admitting: *Deleted

## 2015-10-01 DIAGNOSIS — Z1212 Encounter for screening for malignant neoplasm of rectum: Secondary | ICD-10-CM

## 2015-10-01 LAB — POC HEMOCCULT BLD/STL (HOME/3-CARD/SCREEN)
Card #2 Fecal Occult Blod, POC: NEGATIVE
Card #3 Fecal Occult Blood, POC: NEGATIVE
Fecal Occult Blood, POC: NEGATIVE

## 2015-10-06 ENCOUNTER — Other Ambulatory Visit: Payer: Self-pay | Admitting: Internal Medicine

## 2015-10-15 ENCOUNTER — Other Ambulatory Visit: Payer: Self-pay | Admitting: Internal Medicine

## 2015-10-26 ENCOUNTER — Other Ambulatory Visit: Payer: Self-pay | Admitting: Internal Medicine

## 2015-10-26 ENCOUNTER — Other Ambulatory Visit: Payer: Self-pay | Admitting: *Deleted

## 2015-10-26 DIAGNOSIS — F319 Bipolar disorder, unspecified: Secondary | ICD-10-CM

## 2015-10-26 MED ORDER — FLUOXETINE HCL 40 MG PO CAPS: 40.0000 mg | ORAL_CAPSULE | Freq: Every day | ORAL | Status: DC

## 2015-11-01 ENCOUNTER — Other Ambulatory Visit: Payer: Self-pay | Admitting: *Deleted

## 2015-11-01 DIAGNOSIS — F319 Bipolar disorder, unspecified: Secondary | ICD-10-CM

## 2015-11-01 DIAGNOSIS — I10 Essential (primary) hypertension: Secondary | ICD-10-CM

## 2015-11-01 MED ORDER — LOSARTAN POTASSIUM 100 MG PO TABS: ORAL_TABLET | ORAL | Status: DC

## 2015-11-01 MED ORDER — FLUOXETINE HCL 40 MG PO CAPS: 40.0000 mg | ORAL_CAPSULE | Freq: Every day | ORAL | Status: DC

## 2015-11-09 ENCOUNTER — Other Ambulatory Visit: Payer: Self-pay | Admitting: Internal Medicine

## 2015-11-11 ENCOUNTER — Ambulatory Visit (INDEPENDENT_AMBULATORY_CARE_PROVIDER_SITE_OTHER): Payer: PPO | Admitting: Physician Assistant

## 2015-11-11 VITALS — BP 112/64 | HR 80 | Temp 98.2°F | Resp 16 | Ht 63.5 in | Wt 177.0 lb

## 2015-11-11 DIAGNOSIS — R21 Rash and other nonspecific skin eruption: Secondary | ICD-10-CM | POA: Diagnosis not present

## 2015-11-11 DIAGNOSIS — J0110 Acute frontal sinusitis, unspecified: Secondary | ICD-10-CM | POA: Diagnosis not present

## 2015-11-11 DIAGNOSIS — R059 Cough, unspecified: Secondary | ICD-10-CM

## 2015-11-11 DIAGNOSIS — R05 Cough: Secondary | ICD-10-CM | POA: Diagnosis not present

## 2015-11-11 MED ORDER — AMOXICILLIN-POT CLAVULANATE 875-125 MG PO TABS: 1.0000 | ORAL_TABLET | Freq: Two times a day (BID) | ORAL | Status: DC

## 2015-11-11 MED ORDER — BENZONATATE 100 MG PO CAPS: 100.0000 mg | ORAL_CAPSULE | Freq: Three times a day (TID) | ORAL | Status: DC | PRN

## 2015-11-11 NOTE — Progress Notes (Signed)
   Subjective:    Patient ID: Ashley [name], female    DOB: 10/04/1945, 70 y.o.   MRN: [id number]  Chief Complaint  Patient presents with  . Cough    Began 1 week ago   . Rash    on chest, began 2 weeks ago  . Sore Throat   Medications, allergies, past medical history, surgical history, family history, social history and problem list reviewed and updated.  HPI  25 yof presents with above complaints.   Mildly prod cough started one week ago. Persistent. Assoc sore throat. Head/nasal congestion past 10 days. Rhinorrhea. Has hx asthma but does not use home inhalers. No cp or sob. Has been on her losartan for several years.   Also notes rash across chest past 2 wks. Denies new meds but did start using new scented lotion on chest and arms two wks ago prior to rash starting. Not itchy. Denies fevers, chills.   Review of Systems See HPI. No abd pain, n/v, diarrhea.     Objective:   Physical Exam  Constitutional: She appears well-developed and well-nourished.  Non-toxic appearance. She does not have a sickly appearance. She does not appear ill. No distress.  BP 112/64 mmHg  Pulse 80  Temp(Src) 98.2 F (36.8 C) (Oral)  Resp 16  Ht 5' 3.5" (1.613 m)  Wt 177 lb (80.287 kg)  BMI 30.86 kg/m2  SpO2 95%   HENT:  Right Ear: Tympanic membrane normal.  Left Ear: Tympanic membrane normal.  Nose: Mucosal edema and rhinorrhea present. Right sinus exhibits frontal sinus tenderness. Right sinus exhibits no maxillary sinus tenderness. Left sinus exhibits no maxillary sinus tenderness and no frontal sinus tenderness.  Mouth/Throat: Uvula is midline, oropharynx is clear and moist and mucous membranes are normal.  Severe ttp over right frontal sinus.   Cardiovascular: Normal rate, regular rhythm and normal heart sounds.   Pulmonary/Chest: Effort normal and breath sounds normal. No tachypnea.  Skin:  Maculopapular rash noted across chest. No arm involvement.       Assessment & Plan:   Acute  frontal sinusitis, recurrence not specified - Plan: amoxicillin-clavulanate (AUGMENTIN) 875-125 MG tablet  Cough - Plan: benzonatate (TESSALON) 100 MG capsule  Rash and nonspecific skin eruption --will treat for bacterial sinusitis with sx at 7 days and severe sinus ttp --tessalon for cough --suspect rash is secondary to new lotion, DC lotion, otc steroid cream if persistent, rtc if persistent  Julieta Gutting, PA-C Physician Assistant-Certified [facility]  11/11/2015 2:46 PM

## 2015-11-11 NOTE — Patient Instructions (Signed)
Please take the augmentin twice daily for 10 days for the sinus infection. Taking the tessalon 1-2 times daily will help with the cough. If you're not feeling better in one week please let Ashley [name] know. I think your rash is from the new lotion. This should improve over the next 1-2 weeks. You can apply some over the counter topical hydrocortisone (steroid) cream. If the rash worsens please come back to see Ashley [name].   Sinusitis, Adult Sinusitis is redness, soreness, and inflammation of the paranasal sinuses. Paranasal sinuses are air pockets within the bones of your face. They are located beneath your eyes, in the middle of your forehead, and above your eyes. In healthy paranasal sinuses, mucus is able to drain out, and air is able to circulate through them by way of your nose. However, when your paranasal sinuses are inflamed, mucus and air can become trapped. This can allow bacteria and other germs to grow and cause infection. Sinusitis can develop quickly and last only a short time (acute) or continue over a long period (chronic). Sinusitis that lasts for more than 12 weeks is considered chronic. CAUSES Causes of sinusitis include:  Allergies.  Structural abnormalities, such as displacement of the cartilage that separates your nostrils (deviated septum), which can decrease the air flow through your nose and sinuses and affect sinus drainage.  Functional abnormalities, such as when the small hairs (cilia) that line your sinuses and help remove mucus do not work properly or are not present. SIGNS AND SYMPTOMS Symptoms of acute and chronic sinusitis are the same. The primary symptoms are pain and pressure around the affected sinuses. Other symptoms include:  Upper toothache.  Earache.  Headache.  Bad breath.  Decreased sense of smell and taste.  A cough, which worsens when you are lying flat.  Fatigue.  Fever.  Thick drainage from your nose, which often is green and may contain pus  (purulent).  Swelling and warmth over the affected sinuses. DIAGNOSIS Your health care provider will perform a physical exam. During your exam, your health care provider may perform any of the following to help determine if you have acute sinusitis or chronic sinusitis:  Look in your nose for signs of abnormal growths in your nostrils (nasal polyps).  Tap over the affected sinus to check for signs of infection.  View the inside of your sinuses using an imaging device that has a light attached (endoscope). If your health care provider suspects that you have chronic sinusitis, one or more of the following tests may be recommended:  Allergy tests.  Nasal culture. A sample of mucus is taken from your nose, sent to a lab, and screened for bacteria.  Nasal cytology. A sample of mucus is taken from your nose and examined by your health care provider to determine if your sinusitis is related to an allergy. TREATMENT Most cases of acute sinusitis are related to a viral infection and will resolve on their own within 10 days. Sometimes, medicines are prescribed to help relieve symptoms of both acute and chronic sinusitis. These may include pain medicines, decongestants, nasal steroid sprays, or saline sprays. However, for sinusitis related to a bacterial infection, your health care provider will prescribe antibiotic medicines. These are medicines that will help kill the bacteria causing the infection. Rarely, sinusitis is caused by a fungal infection. In these cases, your health care provider will prescribe antifungal medicine. For some cases of chronic sinusitis, surgery is needed. Generally, these are cases in which sinusitis recurs more than  3 times per year, despite other treatments. HOME CARE INSTRUCTIONS  Drink plenty of water. Water helps thin the mucus so your sinuses can drain more easily.  Use a humidifier.  Inhale steam 3-4 times a day (for example, sit in the bathroom with the shower  running).  Apply a warm, moist washcloth to your face 3-4 times a day, or as directed by your health care provider.  Use saline nasal sprays to help moisten and clean your sinuses.  Take medicines only as directed by your health care provider.  If you were prescribed either an antibiotic or antifungal medicine, finish it all even if you start to feel better. SEEK IMMEDIATE MEDICAL CARE IF:  You have increasing pain or severe headaches.  You have nausea, vomiting, or drowsiness.  You have swelling around your face.  You have vision problems.  You have a stiff neck.  You have difficulty breathing.   This information is not intended to replace advice given to you by your health care provider. Make sure you discuss any questions you have with your health care provider.   Document Released: 08/04/2005 Document Revised: 08/25/2014 Document Reviewed: 08/19/2011 Elsevier Interactive Patient Education [employer].

## 2015-11-17 ENCOUNTER — Other Ambulatory Visit: Payer: Self-pay | Admitting: Internal Medicine

## 2015-11-27 ENCOUNTER — Encounter: Payer: Self-pay | Admitting: Internal Medicine

## 2015-11-27 ENCOUNTER — Ambulatory Visit (INDEPENDENT_AMBULATORY_CARE_PROVIDER_SITE_OTHER): Payer: PPO | Admitting: Internal Medicine

## 2015-11-27 VITALS — BP 128/76 | HR 80 | Temp 97.5°F | Resp 16 | Ht 63.5 in | Wt 176.4 lb

## 2015-11-27 DIAGNOSIS — Z0001 Encounter for general adult medical examination with abnormal findings: Secondary | ICD-10-CM

## 2015-11-27 DIAGNOSIS — Z86010 Personal history of colonic polyps: Secondary | ICD-10-CM | POA: Diagnosis not present

## 2015-11-27 DIAGNOSIS — F332 Major depressive disorder, recurrent severe without psychotic features: Secondary | ICD-10-CM | POA: Diagnosis not present

## 2015-11-27 DIAGNOSIS — G5600 Carpal tunnel syndrome, unspecified upper limb: Secondary | ICD-10-CM

## 2015-11-27 DIAGNOSIS — Z9114 Patient's other noncompliance with medication regimen: Secondary | ICD-10-CM

## 2015-11-27 DIAGNOSIS — G63 Polyneuropathy in diseases classified elsewhere: Secondary | ICD-10-CM

## 2015-11-27 DIAGNOSIS — Z0000 Encounter for general adult medical examination without abnormal findings: Secondary | ICD-10-CM

## 2015-11-27 DIAGNOSIS — E782 Mixed hyperlipidemia: Secondary | ICD-10-CM | POA: Diagnosis not present

## 2015-11-27 DIAGNOSIS — J438 Other emphysema: Secondary | ICD-10-CM | POA: Diagnosis not present

## 2015-11-27 DIAGNOSIS — R6889 Other general symptoms and signs: Secondary | ICD-10-CM | POA: Diagnosis not present

## 2015-11-27 DIAGNOSIS — I10 Essential (primary) hypertension: Secondary | ICD-10-CM

## 2015-11-27 DIAGNOSIS — D518 Other vitamin B12 deficiency anemias: Secondary | ICD-10-CM

## 2015-11-27 DIAGNOSIS — E119 Type 2 diabetes mellitus without complications: Secondary | ICD-10-CM

## 2015-11-27 DIAGNOSIS — E1165 Type 2 diabetes mellitus with hyperglycemia: Secondary | ICD-10-CM

## 2015-11-27 DIAGNOSIS — R300 Dysuria: Secondary | ICD-10-CM

## 2015-11-27 DIAGNOSIS — R269 Unspecified abnormalities of gait and mobility: Secondary | ICD-10-CM | POA: Diagnosis not present

## 2015-11-27 DIAGNOSIS — E559 Vitamin D deficiency, unspecified: Secondary | ICD-10-CM

## 2015-11-27 DIAGNOSIS — F319 Bipolar disorder, unspecified: Secondary | ICD-10-CM

## 2015-11-27 DIAGNOSIS — C519 Malignant neoplasm of vulva, unspecified: Secondary | ICD-10-CM | POA: Diagnosis not present

## 2015-11-27 DIAGNOSIS — Z79899 Other long term (current) drug therapy: Secondary | ICD-10-CM

## 2015-11-27 DIAGNOSIS — Z794 Long term (current) use of insulin: Secondary | ICD-10-CM

## 2015-11-27 DIAGNOSIS — F3130 Bipolar disorder, current episode depressed, mild or moderate severity, unspecified: Secondary | ICD-10-CM

## 2015-11-27 DIAGNOSIS — M1000 Idiopathic gout, unspecified site: Secondary | ICD-10-CM

## 2015-11-27 LAB — CBC WITH DIFFERENTIAL/PLATELET
Basophils Absolute: 0 cells/uL (ref 0–200)
Basophils Relative: 0 %
Eosinophils Absolute: 186 cells/uL (ref 15–500)
Eosinophils Relative: 2 %
HCT: 41 % (ref 35.0–45.0)
Hemoglobin: 13.6 g/dL (ref 11.7–15.5)
Lymphocytes Relative: 25 %
Lymphs Abs: 2325 cells/uL (ref 850–3900)
MCH: 29.5 pg (ref 27.0–33.0)
MCHC: 33.2 g/dL (ref 32.0–36.0)
MCV: 88.9 fL (ref 80.0–100.0)
MPV: 9.7 fL (ref 7.5–12.5)
Monocytes Absolute: 465 cells/uL (ref 200–950)
Monocytes Relative: 5 %
Neutro Abs: 6324 cells/uL (ref 1500–7800)
Neutrophils Relative %: 68 %
Platelets: 376 10*3/uL (ref 140–400)
RBC: 4.61 MIL/uL (ref 3.80–5.10)
RDW: 14.6 % (ref 11.0–15.0)
WBC: 9.3 10*3/uL (ref 3.8–10.8)

## 2015-11-27 LAB — TSH: TSH: 1.06 mIU/L
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
## 2015-11-27 NOTE — Patient Instructions (Addendum)
Please start taking the novolin twice daily as prescribed. Please make sure that you have some peanut butter crackers by the bed.  You must eat 3 meals per day and have 2 snacks.    Please keep the wound on your foot clean and dry.  You can keep using alcohol on it.   Please use vaseline on your skin to help keep it moisturized.    You can use hydrocortisone cream is you need it.

## 2015-11-28 ENCOUNTER — Other Ambulatory Visit: Payer: Self-pay | Admitting: Internal Medicine

## 2015-11-28 DIAGNOSIS — E119 Type 2 diabetes mellitus without complications: Principal | ICD-10-CM

## 2015-11-28 DIAGNOSIS — Z794 Long term (current) use of insulin: Principal | ICD-10-CM

## 2015-11-28 DIAGNOSIS — IMO0001 Reserved for inherently not codable concepts without codable children: Secondary | ICD-10-CM

## 2015-11-28 LAB — LIPID PANEL
Cholesterol: 161 mg/dL (ref 125–200)
HDL: 37 mg/dL — ABNORMAL LOW (ref >=46)
LDL Cholesterol: 54 mg/dL (ref <130)
Total CHOL/HDL Ratio: 4.4 Ratio (ref <=5.0)
Triglycerides: 348 mg/dL — ABNORMAL HIGH (ref <150)
VLDL: 70 mg/dL — ABNORMAL HIGH (ref <30)

## 2015-11-28 LAB — URINALYSIS, MICROSCOPIC ONLY
Bacteria, UA: NONE SEEN [HPF]
Casts: NONE SEEN [LPF]
Crystals: NONE SEEN [HPF]
RBC / HPF: NONE SEEN RBC/HPF (ref <=2)
Yeast: NONE SEEN [HPF]

## 2015-11-28 LAB — URINALYSIS, ROUTINE W REFLEX MICROSCOPIC
Bilirubin Urine: NEGATIVE
Hgb urine dipstick: NEGATIVE
Ketones, ur: NEGATIVE
Leukocytes, UA: NEGATIVE
Nitrite: NEGATIVE
Specific Gravity, Urine: 1.02 (ref 1.001–1.035)
pH: 6 (ref 5.0–8.0)

## 2015-11-28 LAB — BASIC METABOLIC PANEL WITH GFR
BUN: 16 mg/dL (ref 7–25)
CO2: 30 mmol/L (ref 20–31)
Calcium: 9.9 mg/dL (ref 8.6–10.4)
Chloride: 93 mmol/L — ABNORMAL LOW (ref 98–110)
Creat: 1.25 mg/dL — ABNORMAL HIGH (ref 0.50–0.99)
GFR, Est African American: 51 mL/min — ABNORMAL LOW (ref >=60)
GFR, Est Non African American: 44 mL/min — ABNORMAL LOW (ref >=60)
Glucose, Bld: 301 mg/dL — ABNORMAL HIGH (ref 65–99)
Potassium: 4.7 mmol/L (ref 3.5–5.3)
Sodium: 134 mmol/L — ABNORMAL LOW (ref 135–146)

## 2015-11-28 LAB — HEPATIC FUNCTION PANEL
ALT: 19 U/L (ref 6–29)
AST: 14 U/L (ref 10–35)
Albumin: 4 g/dL (ref 3.6–5.1)
Alkaline Phosphatase: 65 U/L (ref 33–130)
Bilirubin, Direct: 0.1 mg/dL (ref <=0.2)
Indirect Bilirubin: 0.3 mg/dL (ref 0.2–1.2)
Total Bilirubin: 0.4 mg/dL (ref 0.2–1.2)
Total Protein: 6.7 g/dL (ref 6.1–8.1)

## 2015-11-28 LAB — URINE CULTURE: Colony Count: 25000

## 2015-11-28 LAB — HEMOGLOBIN A1C
Hgb A1c MFr Bld: 12 % — ABNORMAL HIGH (ref <5.7)
Mean Plasma Glucose: 298 mg/dL

## 2015-12-15 ENCOUNTER — Other Ambulatory Visit: Payer: Self-pay | Admitting: Internal Medicine

## 2016-01-05 ENCOUNTER — Other Ambulatory Visit: Payer: Self-pay | Admitting: Internal Medicine

## 2016-01-11 ENCOUNTER — Other Ambulatory Visit: Payer: Self-pay | Admitting: Internal Medicine

## 2016-01-16 ENCOUNTER — Encounter: Payer: Self-pay | Admitting: Internal Medicine

## 2016-01-16 ENCOUNTER — Ambulatory Visit (INDEPENDENT_AMBULATORY_CARE_PROVIDER_SITE_OTHER): Payer: PPO | Admitting: Internal Medicine

## 2016-01-16 VITALS — BP 120/80 | HR 90 | Temp 97.9°F | Resp 12 | Wt 176.0 lb

## 2016-01-16 DIAGNOSIS — E1165 Type 2 diabetes mellitus with hyperglycemia: Secondary | ICD-10-CM | POA: Diagnosis not present

## 2016-01-16 DIAGNOSIS — E1159 Type 2 diabetes mellitus with other circulatory complications: Secondary | ICD-10-CM | POA: Diagnosis not present

## 2016-01-16 MED ORDER — INSULIN NPH (HUMAN) (ISOPHANE) 100 UNIT/ML ~~LOC~~ SUSP: SUBCUTANEOUS | Status: DC

## 2016-01-16 MED ORDER — GLIPIZIDE ER 5 MG PO TB24
5.0000 mg | ORAL_TABLET | Freq: Every day | ORAL | Status: DC
Start: 2016-01-16 — End: 2016-03-06
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
## 2016-01-16 NOTE — Patient Instructions (Signed)
Please use the NPH as follows: - 35 units in am and 30 units at bedtime  Please add: - Glipizide ER 5 mg in am, 15 min before breakfast  Continue: - Metformin 1000 mg 2x a day, with meals  Please work on improving your diet. No diabetic regimen will work without this! Please let me know if you want to see nutrition again.  Please start walking more (with the cane) and start exercise with small weights.  PATIENT INSTRUCTIONS FOR TYPE 2 DIABETES:  **Please join MyChart!** - see attached instructions about how to join if you have not done so already.  DIET AND EXERCISE Diet and exercise is an important part of diabetic treatment.  We recommended aerobic exercise in the form of brisk walking (working between 40-60% of maximal aerobic capacity, similar to brisk walking) for 150 minutes per week (such as 30 minutes five days per week) along with 3 times per week performing 'resistance' training (using various gauge rubber tubes with handles) 5-10 exercises involving the major muscle groups (upper body, lower body and core) performing 10-15 repetitions (or near fatigue) each exercise. Start at half the above goal but build slowly to reach the above goals. If limited by weight, joint pain, or disability, we recommend daily walking in a swimming pool with water up to waist to reduce pressure from joints while allow for adequate exercise.    BLOOD GLUCOSES Monitoring your blood glucoses is important for continued management of your diabetes. Please check your blood glucoses 2-4 times a day: fasting, before meals and at bedtime (you can rotate these measurements - e.g. one day check before the 3 meals, the next day check before 2 of the meals and before bedtime, etc.).   HYPOGLYCEMIA (low blood sugar) Hypoglycemia is usually a reaction to not eating, exercising, or taking too much insulin/ other diabetes drugs.  Symptoms include tremors, sweating, hunger, confusion, headache, etc. Treat IMMEDIATELY  with 15 grams of Carbs: . 4 glucose tablets .  cup regular juice/soda . 2 tablespoons raisins . 4 teaspoons sugar . 1 tablespoon honey Recheck blood glucose in 15 mins and repeat above if still symptomatic/blood glucose <100.  RECOMMENDATIONS TO REDUCE YOUR RISK OF DIABETIC COMPLICATIONS: * Take your prescribed MEDICATION(S) * Follow a DIABETIC diet: Complex carbs, fiber rich foods, (monounsaturated and polyunsaturated) fats * AVOID saturated/trans fats, high fat foods, >2,300 mg salt per day. * EXERCISE at least 5 times a week for 30 minutes or preferably daily.  * DO NOT SMOKE OR DRINK more than 1 drink a day. * Check your FEET every day. Do not wear tightfitting shoes. Contact us if you develop an ulcer * See your EYE doctor once a year or more if needed * Get a FLU shot once a year * Get a PNEUMONIA vaccine once before and once after age 29 years  GOALS:  * Your Hemoglobin A1c of <7%  * fasting sugars need to be <130 * after meals sugars need to be <180 (2h after you start eating) * Your Systolic BP should be 950 or lower  * Your Diastolic BP should be 80 or lower  * Your HDL (Good Cholesterol) should be 40 or higher  * Your LDL (Bad Cholesterol) should be 100 or lower. * Your Triglycerides should be 150 or lower  * Your Urine microalbumin (kidney function) should be <30 * Your Body Mass Index should be 25 or lower    Please consider the following ways to cut down carbs  and fat and increase fiber and micronutrients in your diet: - substitute whole grain for white bread or pasta - substitute brown rice for white rice - substitute 90-calorie flat bread pieces for slices of bread when possible - substitute sweet potatoes or yams for white potatoes - substitute humus for margarine - substitute tofu for cheese when possible - substitute almond or rice milk for regular milk (would not drink soy milk daily due to concern for soy estrogen influence on breast cancer risk) -  substitute dark chocolate for other sweets when possible - substitute water - can add lemon or orange slices for taste - for diet sodas (artificial sweeteners will trick your body that you can eat sweets without getting calories and will lead you to overeating and weight gain in the long run) - do not skip breakfast or other meals (this will slow down the metabolism and will result in more weight gain over time)  - can try smoothies made from fruit and almond/rice milk in am instead of regular breakfast - can also try old-fashioned (not instant) oatmeal made with almond/rice milk in am - order the dressing on the side when eating salad at a restaurant (pour less than half of the dressing on the salad) - eat as little meat as possible - can try juicing, but should not forget that juicing will get rid of the fiber, so would alternate with eating raw veg./fruits or drinking smoothies - use as little oil as possible, even when using olive oil - can dress a salad with a mix of balsamic vinegar and lemon juice, for e.g. - use agave nectar, stevia sugar, or regular sugar rather than artificial sweateners - steam or broil/roast veggies  - snack on veggies/fruit/nuts (unsalted, preferably) when possible, rather than processed foods - reduce or eliminate aspartame in diet (it is in diet sodas, chewing gum, etc) Read the labels!  Try to read Dr. Janene Harvey book: "Program for Reversing Diabetes" for other ideas for healthy eating.

## 2016-01-21 ENCOUNTER — Telehealth: Payer: Self-pay | Admitting: Internal Medicine

## 2016-01-21 NOTE — Telephone Encounter (Signed)
Returned pt's call. Advised her on when to take her b/s. Pt stated that her b/s was 120 Sat AM and 78 on Sun AM. Pt was taking metformin at bedtime. Advised pt to take with a meal 2 times a day, per Dr Arman Filter note. Pt voiced understanding. Pt stated that she has been taking it at bedtime for years. Be advised.

## 2016-01-21 NOTE — Telephone Encounter (Signed)
Thank you for clarifying this for her!

## 2016-01-21 NOTE — Telephone Encounter (Signed)
Pt needs call back on instruction for glucose log

## 2016-02-13 ENCOUNTER — Encounter: Payer: Self-pay | Admitting: Internal Medicine

## 2016-02-13 ENCOUNTER — Ambulatory Visit (INDEPENDENT_AMBULATORY_CARE_PROVIDER_SITE_OTHER): Payer: PPO | Admitting: Internal Medicine

## 2016-02-13 VITALS — BP 124/70 | HR 92 | Temp 97.8°F | Resp 16 | Ht 63.5 in | Wt 182.0 lb

## 2016-02-13 DIAGNOSIS — J340 Abscess, furuncle and carbuncle of nose: Secondary | ICD-10-CM

## 2016-02-13 MED ORDER — DOXYCYCLINE HYCLATE 100 MG PO CAPS: 100.0000 mg | ORAL_CAPSULE | Freq: Two times a day (BID) | ORAL | Status: DC

## 2016-02-13 NOTE — Progress Notes (Signed)
   Subjective:    Patient ID: Ashley [name], female    DOB: Jul 27, 1946, 70 y.o.   MRN: [id number]  HPI  Patient presents for a nodule on her nose which has been there a couple weeks.  She reports that the spot on top of her nose is red, hard, and tender to palpation.  She is embarassed to look at it and go out in public.  She is putting alcohol on it and is also cleaning it with hot water.  It is improving. She has tried to pull some skin off.  She reports that she didn't mash it.   Review of Systems  Constitutional: Negative for fever, chills and fatigue.  Gastrointestinal: Negative for nausea and vomiting.  Skin: Positive for wound.       Objective:   Physical Exam  Constitutional: She appears well-developed and well-nourished. No distress.  HENT:  Head: Normocephalic.    Mouth/Throat: Oropharynx is clear and moist. No oropharyngeal exudate.  Eyes: Conjunctivae are normal. No scleral icterus.  Cardiovascular: Normal rate, regular rhythm, normal heart sounds and intact distal pulses.  Exam reveals no gallop and no friction rub.   No murmur heard. Pulmonary/Chest: Effort normal and breath sounds normal.  Skin: She is not diaphoretic.  Nursing note and vitals reviewed.   Filed Vitals:   02/13/16 1503  BP: 124/70  Pulse: 92  Temp: 97.8 F (36.6 C)  Resp: 16          Assessment & Plan:    1. Abscess of external nose -doxycycline -warm compresses -no I&D at this time -patient has recheck in 2 weeks.

## 2016-02-13 NOTE — Patient Instructions (Addendum)
Please use warm compresses on this place as often as you can.    Please take the antibiotic until it is gone.  Make sure you take it with food.

## 2016-02-16 ENCOUNTER — Other Ambulatory Visit: Payer: Self-pay | Admitting: Internal Medicine

## 2016-02-16 DIAGNOSIS — E1140 Type 2 diabetes mellitus with diabetic neuropathy, unspecified: Secondary | ICD-10-CM

## 2016-02-16 DIAGNOSIS — E039 Hypothyroidism, unspecified: Secondary | ICD-10-CM

## 2016-02-22 ENCOUNTER — Other Ambulatory Visit: Payer: Self-pay | Admitting: Internal Medicine

## 2016-02-22 DIAGNOSIS — Z1231 Encounter for screening mammogram for malignant neoplasm of breast: Secondary | ICD-10-CM

## 2016-02-27 ENCOUNTER — Ambulatory Visit
Admission: RE | Admit: 2016-02-27 | Discharge: 2016-02-27 | Disposition: A | Discharge status: Home / Self Care | Payer: PPO | Source: Ambulatory Visit | Attending: Internal Medicine | Admitting: Internal Medicine

## 2016-02-27 DIAGNOSIS — Z1231 Encounter for screening mammogram for malignant neoplasm of breast: Secondary | ICD-10-CM | POA: Diagnosis not present

## 2016-03-05 ENCOUNTER — Encounter: Payer: Self-pay | Admitting: Internal Medicine

## 2016-03-05 ENCOUNTER — Ambulatory Visit (INDEPENDENT_AMBULATORY_CARE_PROVIDER_SITE_OTHER): Payer: PPO | Admitting: Internal Medicine

## 2016-03-05 ENCOUNTER — Other Ambulatory Visit: Payer: Self-pay | Admitting: Internal Medicine

## 2016-03-05 ENCOUNTER — Other Ambulatory Visit: Payer: Self-pay | Admitting: *Deleted

## 2016-03-05 VITALS — BP 130/76 | HR 76 | Temp 97.4°F | Resp 16 | Ht 63.5 in | Wt 183.4 lb

## 2016-03-05 DIAGNOSIS — E1122 Type 2 diabetes mellitus with diabetic chronic kidney disease: Secondary | ICD-10-CM

## 2016-03-05 DIAGNOSIS — E559 Vitamin D deficiency, unspecified: Secondary | ICD-10-CM

## 2016-03-05 DIAGNOSIS — E1129 Type 2 diabetes mellitus with other diabetic kidney complication: Secondary | ICD-10-CM

## 2016-03-05 DIAGNOSIS — E039 Hypothyroidism, unspecified: Secondary | ICD-10-CM

## 2016-03-05 DIAGNOSIS — M1000 Idiopathic gout, unspecified site: Secondary | ICD-10-CM | POA: Diagnosis not present

## 2016-03-05 DIAGNOSIS — E1165 Type 2 diabetes mellitus with hyperglycemia: Secondary | ICD-10-CM | POA: Diagnosis not present

## 2016-03-05 DIAGNOSIS — F509 Eating disorder, unspecified: Secondary | ICD-10-CM | POA: Diagnosis not present

## 2016-03-05 DIAGNOSIS — N184 Chronic kidney disease, stage 4 (severe): Secondary | ICD-10-CM | POA: Insufficient documentation

## 2016-03-05 DIAGNOSIS — Z79899 Other long term (current) drug therapy: Secondary | ICD-10-CM | POA: Diagnosis not present

## 2016-03-05 DIAGNOSIS — N183 Chronic kidney disease, stage 3 unspecified: Secondary | ICD-10-CM

## 2016-03-05 DIAGNOSIS — K219 Gastro-esophageal reflux disease without esophagitis: Secondary | ICD-10-CM

## 2016-03-05 DIAGNOSIS — E119 Type 2 diabetes mellitus without complications: Secondary | ICD-10-CM

## 2016-03-05 DIAGNOSIS — Z794 Long term (current) use of insulin: Secondary | ICD-10-CM | POA: Diagnosis not present

## 2016-03-05 DIAGNOSIS — Z9114 Patient's other noncompliance with medication regimen: Secondary | ICD-10-CM

## 2016-03-05 DIAGNOSIS — I10 Essential (primary) hypertension: Secondary | ICD-10-CM | POA: Diagnosis not present

## 2016-03-05 DIAGNOSIS — E782 Mixed hyperlipidemia: Secondary | ICD-10-CM | POA: Diagnosis not present

## 2016-03-05 LAB — HM MAMMOGRAPHY: HM Mammogram: NORMAL (ref 0–4)

## 2016-03-05 MED ORDER — GLUCOSE BLOOD VI STRP: ORAL_STRIP | Status: DC

## 2016-03-05 MED ORDER — PHENTERMINE HCL 37.5 MG PO TABS: ORAL_TABLET | ORAL | Status: DC
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
## 2016-03-05 NOTE — Patient Instructions (Signed)
Recommend Adult Low Dose Aspirin or  coated  Aspirin 81 mg daily  To reduce risk of Colon Cancer 20 %,  Skin Cancer 26 % ,  Melanoma 46%  and  Pancreatic cancer 60% ++++++++++++++++++++++++++++++++++++++++++++++++++++++ Vitamin D goal  is between 70-100.  Please make sure that you are taking your Vitamin D as directed.  It is very important as a natural anti-inflammatory  helping hair, skin, and nails, as well as reducing stroke and heart attack risk.  It helps your bones and helps with mood. It also decreases numerous cancer risks so please take it as directed.  Low Vit D is associated with a 200-300% higher risk for CANCER  and 200-300% higher risk for HEART   ATTACK  &  STROKE.   .....................................Ashley [name] It is also associated with higher death rate at younger ages,  autoimmune diseases like Rheumatoid arthritis, Lupus, Multiple Sclerosis.    Also many other serious conditions, like depression, Alzheimer's Dementia, infertility, muscle aches, fatigue, fibromyalgia - just to name a few.  ++++++++++++++++++++++++++++++++++++++++++++++++  Recommend the book "The END of DIETING" by Dr Excell Seltzer  & the book "The END of DIABETES " by Dr Excell Seltzer At [email or web address] - get book & Audio CD's     Being diabetic has a  300% increased risk for heart attack, stroke, cancer, and alzheimer- type vascular dementia. It is very important that you work harder with diet by avoiding all foods that are white. Avoid white rice (brown & wild rice is OK), white potatoes (sweetpotatoes in moderation is OK), White bread or wheat bread or anything made out of white flour like bagels, donuts, rolls, buns, biscuits, cakes, pastries, cookies, pizza crust, and pasta (made from white flour & egg whites) - vegetarian pasta or spinach or wheat pasta is OK. Multigrain breads like Arnold's or Pepperidge Farm, or multigrain sandwich thins or flatbreads.  Diet, exercise and weight loss can reverse and cure  diabetes in the early stages.  Diet, exercise and weight loss is very important in the control and prevention of complications of diabetes which affects every system in your body, ie. Brain - dementia/stroke, eyes - glaucoma/blindness, heart - heart attack/heart failure, kidneys - dialysis, stomach - gastric paralysis, intestines - malabsorption, nerves - severe painful neuritis, circulation - gangrene & loss of a leg(s), and finally cancer and Alzheimers.    I recommend avoid fried & greasy foods,  sweets/candy, white rice (brown or wild rice or Quinoa is OK), white potatoes (sweet potatoes are OK) - anything made from white flour - bagels, doughnuts, rolls, buns, biscuits,white and wheat breads, pizza crust and traditional pasta made of white flour & egg white(vegetarian pasta or spinach or wheat pasta is OK).  Multi-grain bread is OK - like multi-grain flat bread or sandwich thins. Avoid alcohol in excess. Exercise is also important.    Eat all the vegetables you want - avoid meat, especially red meat and dairy - especially cheese.  Cheese is the most concentrated form of trans-fats which is the worst thing to clog up our arteries. Veggie cheese is OK which can be found in the fresh produce section at [employer] or [employer] or [employer]  ++++++++++++++++++++++++++++++++++++++++++++++++++ DASH Eating Plan  DASH stands for "Dietary Approaches to Stop Hypertension."   The DASH eating plan is a healthy eating plan that has been shown to reduce high blood pressure (hypertension). Additional health benefits may include reducing the risk of type 2 diabetes mellitus, heart disease, and stroke. The DASH eating  plan may also help with weight loss.  WHAT DO I NEED TO KNOW ABOUT THE DASH EATING PLAN?  For the DASH eating plan, you will follow these general guidelines:  Choose foods with a percent daily value for sodium of less than 5% (as listed on the food label).  Use salt-free seasonings or herbs  instead of table salt or sea salt.  Check with your health care provider or pharmacist before using salt substitutes.  Eat lower-sodium products, often labeled as "lower sodium" or "no salt added."  Eat fresh foods.  Eat more vegetables, fruits, and low-fat dairy products.    Choose whole grains. Look for the word "whole" as the first word in the ingredient list.  Choose fish   Limit sweets, desserts, sugars, and sugary drinks.  Choose heart-healthy fats.  Eat veggie cheese   Eat more home-cooked food and less restaurant, buffet, and fast food.  Limit fried foods.  Cook foods using methods other than frying.  Limit canned vegetables. If you do use them, rinse them well to decrease the sodium.  When eating at a restaurant, ask that your food be prepared with less salt, or no salt if possible.                      WHAT FOODS CAN I EAT?  Read Dr Fara Olden Fuhrman's books on The End of Dieting & The End of Diabetes  Grains  Whole grain or whole wheat bread. Brown rice. Whole grain or whole wheat pasta. Quinoa, bulgur, and whole grain cereals. Low-sodium cereals. Corn or whole wheat flour tortillas. Whole grain cornbread. Whole grain crackers. Low-sodium crackers.  Vegetables  Fresh or frozen vegetables (raw, steamed, roasted, or grilled). Low-sodium or reduced-sodium tomato and vegetable juices. Low-sodium or reduced-sodium tomato sauce and paste. Low-sodium or reduced-sodium canned vegetables.   Fruits  All fresh, canned (in natural juice), or frozen fruits.  Protein Products   All fish and seafood.  Dried beans, peas, or lentils. Unsalted nuts and seeds. Unsalted canned beans.  Dairy  Low-fat dairy products, such as skim or 1% milk, 2% or reduced-fat cheeses, low-fat ricotta or cottage cheese, or plain low-fat yogurt. Low-sodium or reduced-sodium cheeses.  Fats and Oils  Tub margarines without trans fats. Light or reduced-fat mayonnaise and salad dressings (reduced  sodium). Avocado. Safflower, olive, or canola oils. Natural peanut or almond butter.  Other  Unsalted popcorn and pretzels. The items listed above may not be a complete list of recommended foods or beverages. Contact your dietitian for more options.  +++++++++++++++++++++++++++++++++++++++++++  WHAT FOODS ARE NOT RECOMMENDED?  Grains/ White flour or wheat flour  White bread. White pasta. White rice. Refined cornbread. Bagels and croissants. Crackers that contain trans fat.  Vegetables  Creamed or fried vegetables.  Regular canned vegetables. Regular canned tomato sauce and paste. Regular tomato and vegetable juices.  Fruits  Dried fruits. Canned fruit in light or heavy syrup. Fruit juice.  Meat and Other Protein Products  Meat in general - RED mwaet & White meat.  Fatty cuts of meat. Ribs, chicken wings, bacon, sausage, bologna, salami, chitterlings, fatback, hot dogs, bratwurst, and packaged luncheon meats.  Dairy  Whole or 2% milk, cream, half-and-half, and cream cheese. Whole-fat or sweetened yogurt. Full-fat cheeses or blue cheese. Nondairy creamers and whipped toppings. Processed cheese, cheese spreads, or cheese curds.  Condiments  Onion and garlic salt, seasoned salt, table salt, and sea salt. Canned and packaged gravies. Worcestershire sauce. Tartar sauce. Barbecue  sauce. Teriyaki sauce. Soy sauce, including reduced sodium. Steak sauce. Fish sauce. Oyster sauce. Cocktail sauce. Horseradish. Ketchup and mustard. Meat flavorings and tenderizers. Bouillon cubes. Hot sauce. Tabasco sauce. Marinades. Taco seasonings. Relishes.  Fats and Oils Butter, stick margarine, lard, shortening and bacon fat. Coconut, palm kernel, or palm oils. Regular salad dressings.  Pickles and olives. Salted popcorn and pretzels.  The items listed above may not be a complete list of foods and beverages to avoid.

## 2016-03-06 ENCOUNTER — Encounter: Payer: Self-pay | Admitting: Internal Medicine

## 2016-03-06 ENCOUNTER — Ambulatory Visit (INDEPENDENT_AMBULATORY_CARE_PROVIDER_SITE_OTHER): Payer: PPO | Admitting: Internal Medicine

## 2016-03-06 VITALS — BP 142/90 | HR 90 | Ht 64.0 in | Wt 190.0 lb

## 2016-03-06 DIAGNOSIS — E1159 Type 2 diabetes mellitus with other circulatory complications: Secondary | ICD-10-CM | POA: Diagnosis not present

## 2016-03-06 DIAGNOSIS — E1165 Type 2 diabetes mellitus with hyperglycemia: Secondary | ICD-10-CM | POA: Diagnosis not present

## 2016-03-06 LAB — MICROALBUMIN / CREATININE URINE RATIO

## 2016-03-06 LAB — BASIC METABOLIC PANEL WITH GFR
BUN: 11 mg/dL (ref 7–25)
CO2: 26 mmol/L (ref 20–31)
Calcium: 9.8 mg/dL (ref 8.6–10.4)
Chloride: 97 mmol/L — ABNORMAL LOW (ref 98–110)
Creat: 1.05 mg/dL — ABNORMAL HIGH (ref 0.50–0.99)
GFR, Est African American: 63 mL/min (ref >=60)
GFR, Est Non African American: 55 mL/min — ABNORMAL LOW (ref >=60)
Glucose, Bld: 229 mg/dL — ABNORMAL HIGH (ref 65–99)
Potassium: 4.6 mmol/L (ref 3.5–5.3)
Sodium: 134 mmol/L — ABNORMAL LOW (ref 135–146)

## 2016-03-06 LAB — LIPID PANEL
Cholesterol: 130 mg/dL (ref 125–200)
HDL: 33 mg/dL — ABNORMAL LOW (ref >=46)
LDL Cholesterol: 40 mg/dL (ref <130)
Total CHOL/HDL Ratio: 3.9 Ratio (ref <=5.0)
Triglycerides: 284 mg/dL — ABNORMAL HIGH (ref <150)
VLDL: 57 mg/dL — ABNORMAL HIGH (ref <30)

## 2016-03-06 LAB — CBC WITH DIFFERENTIAL/PLATELET
Basophils Absolute: 0 cells/uL (ref 0–200)
Basophils Relative: 0 %
Eosinophils Absolute: 231 cells/uL (ref 15–500)
Eosinophils Relative: 3 %
HCT: 40.2 % (ref 35.0–45.0)
Hemoglobin: 13 g/dL (ref 11.7–15.5)
Lymphocytes Relative: 24 %
Lymphs Abs: 1848 cells/uL (ref 850–3900)
MCH: 29.4 pg (ref 27.0–33.0)
MCHC: 32.3 g/dL (ref 32.0–36.0)
MCV: 91 fL (ref 80.0–100.0)
MPV: 9.1 fL (ref 7.5–12.5)
Monocytes Absolute: 539 cells/uL (ref 200–950)
Monocytes Relative: 7 %
Neutro Abs: 5082 cells/uL (ref 1500–7800)
Neutrophils Relative %: 66 %
Platelets: 314 10*3/uL (ref 140–400)
RBC: 4.42 MIL/uL (ref 3.80–5.10)
RDW: 14.3 % (ref 11.0–15.0)
WBC: 7.7 10*3/uL (ref 3.8–10.8)

## 2016-03-06 LAB — HEPATIC FUNCTION PANEL
ALT: 17 U/L (ref 6–29)
AST: 14 U/L (ref 10–35)
Albumin: 3.9 g/dL (ref 3.6–5.1)
Alkaline Phosphatase: 58 U/L (ref 33–130)
Bilirubin, Direct: 0.1 mg/dL (ref <=0.2)
Indirect Bilirubin: 0.3 mg/dL (ref 0.2–1.2)
Total Bilirubin: 0.4 mg/dL (ref 0.2–1.2)
Total Protein: 6.5 g/dL (ref 6.1–8.1)

## 2016-03-06 LAB — MAGNESIUM: Magnesium: 1.3 mg/dL — ABNORMAL LOW (ref 1.5–2.5)

## 2016-03-06 LAB — HEMOGLOBIN A1C
Hgb A1c MFr Bld: 8.9 % — ABNORMAL HIGH (ref <5.7)
Mean Plasma Glucose: 209 mg/dL

## 2016-03-06 LAB — TSH: TSH: 0.91 mIU/L

## 2016-03-06 LAB — VITAMIN D 25 HYDROXY (VIT D DEFICIENCY, FRACTURES): Vit D, 25-Hydroxy: 38 ng/mL (ref 30–100)

## 2016-03-06 MED ORDER — INSULIN NPH (HUMAN) (ISOPHANE) 100 UNIT/ML ~~LOC~~ SUSP: SUBCUTANEOUS | Status: DC

## 2016-03-06 MED ORDER — GLIPIZIDE ER 5 MG PO TB24: ORAL_TABLET | ORAL | Status: DC

## 2016-03-06 NOTE — Patient Instructions (Addendum)
Please continue: - Metformin 1000 mg 2x a day, with meals  Please change: - NPH 40 units in am and 20 units at bedtime  Please increase: - Glipizide ER 5 mg in am, 15 min before breakfast Add 2 halves of the Glipizide ER 5 mg before dinner.  KEEP UP THE GREAT WORK!
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
## 2016-03-07 LAB — MICROALBUMIN / CREATININE URINE RATIO
Creatinine, Urine: 13 mg/dL — ABNORMAL LOW (ref 20–320)
Microalb Creat Ratio: 1592 mcg/mg creat — ABNORMAL HIGH (ref <30)
Microalb, Ur: 20.7 mg/dL

## 2016-03-11 ENCOUNTER — Telehealth: Payer: Self-pay | Admitting: Internal Medicine

## 2016-03-11 NOTE — Telephone Encounter (Signed)
Patient has been having a lot of fall, she thin her medication glipiZIDE is the reason for , please advise on what to do

## 2016-04-08 ENCOUNTER — Encounter: Payer: Self-pay | Admitting: Internal Medicine

## 2016-04-08 ENCOUNTER — Ambulatory Visit (INDEPENDENT_AMBULATORY_CARE_PROVIDER_SITE_OTHER): Payer: PPO | Admitting: Internal Medicine

## 2016-04-08 VITALS — BP 140/60 | HR 86 | Temp 97.6°F | Resp 16 | Ht 64.0 in | Wt 177.6 lb

## 2016-04-08 DIAGNOSIS — E1165 Type 2 diabetes mellitus with hyperglycemia: Secondary | ICD-10-CM | POA: Diagnosis not present

## 2016-04-08 DIAGNOSIS — F509 Eating disorder, unspecified: Secondary | ICD-10-CM | POA: Diagnosis not present

## 2016-04-08 DIAGNOSIS — Z794 Long term (current) use of insulin: Secondary | ICD-10-CM

## 2016-04-08 DIAGNOSIS — E1129 Type 2 diabetes mellitus with other diabetic kidney complication: Secondary | ICD-10-CM | POA: Diagnosis not present

## 2016-04-08 DIAGNOSIS — E119 Type 2 diabetes mellitus without complications: Secondary | ICD-10-CM

## 2016-04-08 DIAGNOSIS — R296 Repeated falls: Secondary | ICD-10-CM | POA: Diagnosis not present

## 2016-04-08 NOTE — Patient Instructions (Signed)
Please fill the Rx for   Phentermine (diet pill)   And take every morning before breakfast  ++++++++++++++++++++++++++++++++++++  Please use your walker to prevent falls   ++++++++++++++++++++++++++++++++++++  Suggest you contact the [facility]  & request a "Driving Evaluation"

## 2016-04-08 NOTE — Progress Notes (Signed)
Subjective:    Patient ID: Ashley [name], female    DOB: 03-08-1946, 70 y.o.   MRN: [id number]  HPI   This poorly compliant histrionic Morbidly obese MWF with consequent poorly controlled insulin requiring T2_DM is now being managed by Dr Cruzita Lederer for her Diabetes and at last OV was prescribed Phentermine for hercompulsive gluttony which she has only taken about a week and has has a significant weight loss of # 6  from 183# to 177# today. CBG monitoring is sporadic as usual for her. She denies diabetic polys, paresthesias, visual blurring or hypoglycemic rxn's.     Unfortunately she c/o multiple falls and is very vague wrt the premonitory symptomology.  Denies postural sx's, vertiginous sx's and attributes her falls to "poor balance". She uses a cane sporadically, but refuses to use any of her # 3 walkers.   Medication Sig  . albuterol (PROVENTIL HFA;VENTOLIN HFA) 108 (90 BASE) MCG/ACT inhaler Inhale 2 puffs into the lungs every 6 (six) hours as needed for wheezing or shortness of breath.  . allopurinol (ZYLOPRIM) 300 MG tablet TAKE 1 TABLET BY MOUTH EVERY MORNING.  Marland Kitchen atorvastatin (LIPITOR) 40 MG tablet TAKE 1 TABLET BY MOUTH EVERY DAY  . clonazePAM (KLONOPIN) 1 MG tablet Take 1 mg by mouth 3 (three) times daily as needed for anxiety.  . fenofibrate (TRICOR) 145 MG tablet Take 1 tablet (145 mg total) by mouth daily.  Marland Kitchen FLUoxetine (PROZAC) 40 MG capsule Take 1 capsule (40 mg total) by mouth daily.  Marland Kitchen gabapentin (NEURONTIN) 300 MG capsule TAKE ONE CAPSULE BY MOUTH IN THE EVENING  . glipiZIDE (GLUCOTROL XL) 5 MG 24 hr tablet Use 1 tablet in am and 2x1/2 tab before dinner  . glucose blood test strip One touch ultra II test strips - Patient checks blood sugar 3 times daily-DX-E11.9.  Marland Kitchen hydrochlorothiazide (HYDRODIURIL) 25 MG tablet TAKE 1 TABLET BY MOUTH EVERY OTHER DAY  . hydrocortisone cream 1 % Apply to affected area 2 times daily  . insulin NPH Human (NOVOLIN N) 100 UNIT/ML injection INJECT 40  UNITS EVERY MORNING AND 20 UNITS EVERY EVENING  . levothyroxine (SYNTHROID, LEVOTHROID) 100 MCG tablet TAKE 1 TABLET BY MOUTH EVERY DAY  . losartan (COZAAR) 100 MG tablet Take 1 tablet daily for BP & Kidney Protection  . metFORMIN (GLUCOPHAGE) 1000 MG tablet TAKE 1 TABLET BY MOUTH TWICE A DAY WITH A MEAL  . OVER THE COUNTER MEDICATION Hair,skin and nails vitamin 1 tablet daily.  . polyethylene glycol (MIRALAX / GLYCOLAX) packet Take 17 g by mouth as needed.   . ranitidine (ZANTAC) 300 MG tablet TAKE 1 TABLET (300 MG TOTAL) BY MOUTH 2 (TWO) TIMES DAILY.  . fluconazole (DIFLUCAN) 150 MG tablet Take 1 tablet weekly for yeast infection (Patient not taking: Reported on 04/08/2016)  . phentermine (ADIPEX-P) 37.5 MG tablet Take 1/2 to 1 tablet every morning for dieting & weightloss (Patient not taking: Reported on 04/08/2016)    Allergies  Allergen Reactions  . Sulfa Antibiotics Other (See Comments)    Pt states she had "extreme pain"  . Sulfacetamide Sodium    Past Medical History:  Diagnosis Date  . Anxiety disorder   . Arthritis LOWER BACK  . Asthma   . Bipolar 1 disorder ([place])   . Carpal tunnel syndrome   . Depression   . Diabetes mellitus   . Dyslipidemia   . Gait disorder   . GERD (gastroesophageal reflux disease)   . Gout LEFT FOOT-  STABLE  .  H/O hiatal hernia   . History of CVA (cerebrovascular accident) FOUND PER MRI 1994--  RESIDUAL MEMORY IMPAIRED  . Hypercholesteremia   . Hypertension   . Neuropathy of lower extremity   . OCD (obsessive compulsive disorder)   . Peripheral neuropathy ([place])   . SOB (shortness of breath) on exertion   . SUI (stress urinary incontinence, female)   . Vulvar lesion    Review of Systems  10 point systems review negative except as above.     Objective:   Physical Exam  BP 140/60   Pulse 86   Temp 97.6 F (36.4 C)   Resp 16   Ht '5\' 4"'$  (1.626 m)   Wt 177 lb 9.6 oz (80.6 kg)   SpO2 98%   BMI 30.48 kg/m   HEENT - WNL. Neck - supple.  Nl Thyroid. Carotids 2+ & No bruits, JVD Chest - Clear equal BS. Cor - Nl HS. RRR w/o sig MGR. No edema. Abd - soft w/central obesity. MS- FROM w/o deformities. Muscle power, tone and bulk Nl. Gait broad based stabilized with a cane. Neuro - No obvious Cr N abnormalities. Sensory, motor and Cerebellar functions appear Nl w/o focal abnormalities. Psyche - Mental status immature & inappropriate.     Assessment & Plan:   1. Poorly controlled type 2 diabetes mellitus with renal complication (HCC)   2. Insulin-requiring or dependent type II diabetes mellitus ([place])   3. Frequent falls  - recommended to patient to contact the [facility] & request a Driving Evaluation  4. Compulsive eating patterns  Over 25 minutes of exam, counseling, chart review and  complex critical decision making was performed

## 2016-04-10 ENCOUNTER — Other Ambulatory Visit: Payer: Self-pay | Admitting: Internal Medicine

## 2016-04-11 ENCOUNTER — Other Ambulatory Visit: Payer: Self-pay | Admitting: Internal Medicine

## 2016-04-29 ENCOUNTER — Ambulatory Visit: Payer: PPO | Admitting: Internal Medicine

## 2016-04-29 ENCOUNTER — Other Ambulatory Visit: Payer: Self-pay | Admitting: Internal Medicine

## 2016-05-07 ENCOUNTER — Telehealth: Payer: Self-pay | Admitting: Internal Medicine

## 2016-05-14 ENCOUNTER — Ambulatory Visit (INDEPENDENT_AMBULATORY_CARE_PROVIDER_SITE_OTHER): Payer: PPO | Admitting: Internal Medicine

## 2016-05-14 VITALS — BP 140/84 | HR 76 | Temp 97.8°F | Resp 16 | Ht 63.5 in | Wt 180.0 lb

## 2016-05-14 DIAGNOSIS — E1129 Type 2 diabetes mellitus with other diabetic kidney complication: Secondary | ICD-10-CM | POA: Diagnosis not present

## 2016-05-14 DIAGNOSIS — E1165 Type 2 diabetes mellitus with hyperglycemia: Secondary | ICD-10-CM

## 2016-05-14 DIAGNOSIS — Z794 Long term (current) use of insulin: Secondary | ICD-10-CM

## 2016-05-14 DIAGNOSIS — E119 Type 2 diabetes mellitus without complications: Secondary | ICD-10-CM | POA: Diagnosis not present

## 2016-05-18 ENCOUNTER — Encounter: Payer: Self-pay | Admitting: Internal Medicine

## 2016-05-18 NOTE — Patient Instructions (Signed)

## 2016-05-18 NOTE — Progress Notes (Signed)
Subjective:    Patient ID: Ashley [name], female    DOB: 15-Apr-1946, 70 y.o.   MRN: [id number]  HPI  Patient I a 70 yo poorly compliant Insulin req T2_DM who returns for 1 month f/u trial on Phentermine which she only took a few doses and stopped for unclear reasons. Patient's A1c had improved from 12.0% to 8.9%, but still reflects poor control consequent of her poor dietary compliance. Patient is only sporadically checking CBG's and Dr Cruzita Lederer is also trying to encourage better diet as well a managing her medications. Patient denies any hypoglycemic reactions, diabetic polys (Except polyphagia), paresthesias, or visual blurring.  Medication Sig  . albuterol (PROVENTIL HFA;VENTOLIN HFA) 108 (90 BASE) MCG/ACT inhaler Inhale 2 puffs into the lungs every 6 (six) hours as needed for wheezing or shortness of breath.  . allopurinol (ZYLOPRIM) 300 MG tablet TAKE 1 TABLET BY MOUTH EVERY MORNING.  Marland Kitchen atorvastatin (LIPITOR) 40 MG tablet TAKE 1 TABLET BY MOUTH EVERY DAY  . clonazePAM (KLONOPIN) 1 MG tablet Take 1 mg by mouth 3 (three) times daily as needed for anxiety.  . fenofibrate (TRICOR) 145 MG tablet Take 1 tablet (145 mg total) by mouth daily.  Marland Kitchen FLUoxetine (PROZAC) 40 MG capsule Take 1 capsule (40 mg total) by mouth daily.  Marland Kitchen gabapentin (NEURONTIN) 300 MG capsule TAKE ONE CAPSULE BY MOUTH IN THE EVENING  . glucose blood test strip One touch ultra II test strips - Patient checks blood sugar 3 times daily-DX-E11.9.  Marland Kitchen hydrochlorothiazide (HYDRODIURIL) 25 MG tablet TAKE 1 TABLET BY MOUTH EVERY OTHER DAY  . hydrocortisone cream 1 % Apply to affected area 2 times daily  . insulin NPH Human (NOVOLIN N) 100 UNIT/ML injection INJECT 40 UNITS EVERY MORNING AND 20 UNITS EVERY EVENING  . levothyroxine (SYNTHROID, LEVOTHROID) 100 MCG tablet TAKE 1 TABLET BY MOUTH EVERY DAY  . losartan (COZAAR) 100 MG tablet Take 1 tablet daily for BP & Kidney Protection  . metFORMIN (GLUCOPHAGE) 1000 MG tablet TAKE 1 TABLET BY  MOUTH TWICE A DAY WITH A MEAL  . OVER THE COUNTER MEDICATION Hair,skin and nails vitamin 1 tablet daily.  . polyethylene glycol (MIRALAX / GLYCOLAX) packet Take 17 g by mouth as needed.   . ranitidine (ZANTAC) 300 MG tablet TAKE 1 TABLET (300 MG TOTAL) BY MOUTH 2 (TWO) TIMES DAILY.  Marland Kitchen glipiZIDE (GLUCOTROL XL) 5 MG 24 hr tablet TAKE 1 TABLET BY MOUTH EVERY DAY BEFORE BREAKFAST (Patient not taking: Reported on 05/14/2016)  . phentermine (ADIPEX-P) 37.5 MG tablet TAKE 1/2 TO 1 TABLET BY MOUTH EVERY MORNING FOR DIETING AND WEIGHT LOSS (Patient not taking: Reported on 05/14/2016)  . glipiZIDE (GLUCOTROL XL) 5 MG 24 hr tablet Use 1 tablet in am and 2x1/2 tab before dinner   No facility-administered medications prior to visit.    Allergies  Allergen Reactions  . Sulfa Antibiotics Other (See Comments)    Pt states she had "extreme pain"  . Sulfacetamide Sodium    Review of Systems 10 point systems review negative except as above.    Objective:   Physical Exam  BP 140/84   Pulse 76   Temp 97.8 F (36.6 C)   Resp 16   Ht 5' 3.5" (1.613 m)   Wt 180 lb (81.6 kg)   BMI 31.39 kg/m   HEENT - WNL Neck - supple. Nl Thyroid.  Chest - Clear. Cor - Nl HS. RRR w/o sig M. No edema. MS- FROM w/o deformities.  Gait Nl. Neuro -  Nl w/o focal abnormalities.    Assessment & Plan:   1. Poorly controlled type 2 diabetes mellitus with renal complication (HCC)   2. Insulin-requiring or dependent type II diabetes mellitus ([place])  - Has 1 month f/u for quarterly labs

## 2016-05-22 DIAGNOSIS — E119 Type 2 diabetes mellitus without complications: Secondary | ICD-10-CM | POA: Diagnosis not present

## 2016-05-22 DIAGNOSIS — E113291 Type 2 diabetes mellitus with mild nonproliferative diabetic retinopathy without macular edema, right eye: Secondary | ICD-10-CM | POA: Diagnosis not present

## 2016-05-22 DIAGNOSIS — H2513 Age-related nuclear cataract, bilateral: Secondary | ICD-10-CM | POA: Diagnosis not present

## 2016-05-22 DIAGNOSIS — E113292 Type 2 diabetes mellitus with mild nonproliferative diabetic retinopathy without macular edema, left eye: Secondary | ICD-10-CM | POA: Diagnosis not present

## 2016-05-26 ENCOUNTER — Other Ambulatory Visit: Payer: Self-pay | Admitting: Internal Medicine

## 2016-05-26 DIAGNOSIS — Z794 Long term (current) use of insulin: Principal | ICD-10-CM

## 2016-05-26 DIAGNOSIS — E1140 Type 2 diabetes mellitus with diabetic neuropathy, unspecified: Secondary | ICD-10-CM

## 2016-05-26 DIAGNOSIS — E119 Type 2 diabetes mellitus without complications: Secondary | ICD-10-CM

## 2016-06-09 ENCOUNTER — Other Ambulatory Visit: Payer: Self-pay | Admitting: Internal Medicine

## 2016-06-09 ENCOUNTER — Telehealth: Payer: Self-pay | Admitting: Internal Medicine

## 2016-06-09 NOTE — Telephone Encounter (Signed)
DMV is questioning her ability to drive

## 2016-06-12 ENCOUNTER — Encounter: Payer: Self-pay | Admitting: Physician Assistant

## 2016-06-12 ENCOUNTER — Ambulatory Visit (INDEPENDENT_AMBULATORY_CARE_PROVIDER_SITE_OTHER): Payer: PPO | Admitting: Physician Assistant

## 2016-06-12 VITALS — BP 122/70 | HR 90 | Temp 97.3°F | Resp 16 | Ht 63.5 in | Wt 179.4 lb

## 2016-06-12 DIAGNOSIS — G63 Polyneuropathy in diseases classified elsewhere: Secondary | ICD-10-CM | POA: Diagnosis not present

## 2016-06-12 DIAGNOSIS — R2689 Other abnormalities of gait and mobility: Secondary | ICD-10-CM

## 2016-06-12 DIAGNOSIS — Z794 Long term (current) use of insulin: Secondary | ICD-10-CM | POA: Diagnosis not present

## 2016-06-12 DIAGNOSIS — E1165 Type 2 diabetes mellitus with hyperglycemia: Secondary | ICD-10-CM

## 2016-06-12 DIAGNOSIS — C519 Malignant neoplasm of vulva, unspecified: Secondary | ICD-10-CM

## 2016-06-12 DIAGNOSIS — E1159 Type 2 diabetes mellitus with other circulatory complications: Secondary | ICD-10-CM | POA: Diagnosis not present

## 2016-06-12 DIAGNOSIS — F319 Bipolar disorder, unspecified: Secondary | ICD-10-CM

## 2016-06-12 DIAGNOSIS — I10 Essential (primary) hypertension: Secondary | ICD-10-CM | POA: Diagnosis not present

## 2016-06-12 DIAGNOSIS — E1122 Type 2 diabetes mellitus with diabetic chronic kidney disease: Secondary | ICD-10-CM | POA: Diagnosis not present

## 2016-06-12 DIAGNOSIS — E119 Type 2 diabetes mellitus without complications: Secondary | ICD-10-CM | POA: Diagnosis not present

## 2016-06-12 DIAGNOSIS — J438 Other emphysema: Secondary | ICD-10-CM

## 2016-06-12 DIAGNOSIS — E782 Mixed hyperlipidemia: Secondary | ICD-10-CM | POA: Diagnosis not present

## 2016-06-12 DIAGNOSIS — N183 Chronic kidney disease, stage 3 unspecified: Secondary | ICD-10-CM

## 2016-06-12 DIAGNOSIS — E1129 Type 2 diabetes mellitus with other diabetic kidney complication: Secondary | ICD-10-CM | POA: Diagnosis not present

## 2016-06-12 DIAGNOSIS — E039 Hypothyroidism, unspecified: Secondary | ICD-10-CM | POA: Diagnosis not present

## 2016-06-12 DIAGNOSIS — F332 Major depressive disorder, recurrent severe without psychotic features: Secondary | ICD-10-CM

## 2016-06-12 DIAGNOSIS — R4701 Aphasia: Secondary | ICD-10-CM | POA: Diagnosis not present

## 2016-06-12 DIAGNOSIS — F3130 Bipolar disorder, current episode depressed, mild or moderate severity, unspecified: Secondary | ICD-10-CM | POA: Diagnosis not present

## 2016-06-12 LAB — CBC WITH DIFFERENTIAL/PLATELET
Basophils Absolute: 0 cells/uL (ref 0–200)
Basophils Relative: 0 %
Eosinophils Absolute: 176 cells/uL (ref 15–500)
Eosinophils Relative: 2 %
HCT: 38.5 % (ref 35.0–45.0)
Hemoglobin: 13 g/dL (ref 11.7–15.5)
Lymphocytes Relative: 24 %
Lymphs Abs: 2112 cells/uL (ref 850–3900)
MCH: 30.5 pg (ref 27.0–33.0)
MCHC: 33.8 g/dL (ref 32.0–36.0)
MCV: 90.4 fL (ref 80.0–100.0)
MPV: 9.2 fL (ref 7.5–12.5)
Monocytes Absolute: 528 cells/uL (ref 200–950)
Monocytes Relative: 6 %
Neutro Abs: 5984 cells/uL (ref 1500–7800)
Neutrophils Relative %: 68 %
Platelets: 296 10*3/uL (ref 140–400)
RBC: 4.26 MIL/uL (ref 3.80–5.10)
RDW: 14.1 % (ref 11.0–15.0)
WBC: 8.8 10*3/uL (ref 3.8–10.8)

## 2016-06-12 LAB — BASIC METABOLIC PANEL WITH GFR
BUN: 16 mg/dL (ref 7–25)
CO2: 23 mmol/L (ref 20–31)
Calcium: 10 mg/dL (ref 8.6–10.4)
Chloride: 93 mmol/L — ABNORMAL LOW (ref 98–110)
Creat: 1.14 mg/dL — ABNORMAL HIGH (ref 0.50–0.99)
GFR, Est African American: 57 mL/min — ABNORMAL LOW (ref >=60)
GFR, Est Non African American: 49 mL/min — ABNORMAL LOW (ref >=60)
Glucose, Bld: 189 mg/dL — ABNORMAL HIGH (ref 65–99)
Potassium: 4.1 mmol/L (ref 3.5–5.3)
Sodium: 132 mmol/L — ABNORMAL LOW (ref 135–146)

## 2016-06-12 LAB — LIPID PANEL
Cholesterol: 121 mg/dL — ABNORMAL LOW (ref 125–200)
HDL: 31 mg/dL — ABNORMAL LOW (ref >=46)
LDL Cholesterol: 34 mg/dL (ref <130)
Total CHOL/HDL Ratio: 3.9 Ratio (ref <=5.0)
Triglycerides: 278 mg/dL — ABNORMAL HIGH (ref <150)
VLDL: 56 mg/dL — ABNORMAL HIGH (ref <30)

## 2016-06-12 LAB — HEPATIC FUNCTION PANEL
ALT: 14 U/L (ref 6–29)
AST: 13 U/L (ref 10–35)
Albumin: 3.5 g/dL — ABNORMAL LOW (ref 3.6–5.1)
Alkaline Phosphatase: 71 U/L (ref 33–130)
Bilirubin, Direct: 0.1 mg/dL (ref <=0.2)
Indirect Bilirubin: 0.4 mg/dL (ref 0.2–1.2)
Total Bilirubin: 0.5 mg/dL (ref 0.2–1.2)
Total Protein: 5.9 g/dL — ABNORMAL LOW (ref 6.1–8.1)

## 2016-06-12 LAB — HEMOGLOBIN A1C
Hgb A1c MFr Bld: 7.5 % — ABNORMAL HIGH (ref <5.7)
Mean Plasma Glucose: 169 mg/dL

## 2016-06-12 LAB — TSH: TSH: 0.7 mIU/L

## 2016-06-12 NOTE — Progress Notes (Signed)
3 MONTH FOLLOW UP  Assessment:   Essential hypertension -cont meds -dash diet -monitor at home - TSH  COPD -from history of 138 pack years -recommended quitting smoking -not active so no known shortness of breath  Insulin-requiring or dependent type II diabetes mellitus ([place]) -extremely non-compliant with diabetes treatment and is not taking insulin as instructed -may need referral to endocrinology  Poorly controlled type 2 diabetes mellitus ([place]) -cont insulin -recommended taking as instructed -extremely poor diet reported - Hemoglobin A1c  Diabetic Peripheral Neuropathy -recommended tighter control of sugars to prevent progression -cont insulin therapy  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 Of overly medication and neuropathy versus stroke/posterior -recommended using walker  Medication management - CBC with Differential/Platelet - BASIC METABOLIC PANEL WITH GFR - Hepatic function panel  Bipolar depression (HCC) -cont to take medications -encouraged to make appointment with psych   Non compliance w medication regimen -continued non-compliance -recommend referring to endocrine  Dysuria - Urinalysis, Routine w reflex microscopic (not at [facility]) - Culture, Urine  Aphasia Get MRA with and without, check labs, add 325 ASA, monitor sugars Suggest decreasing klonopin Declines going to ER, discussed reasons to go/signs of stroke, if worsening HA, changes vision/speech, worsening imbalance, weakness   Over 30 minutes of exam, counseling, chart review, and critical decision making was performed   Subjective:   Ashley [name] is a 70 y.o. female who presents for 3 month  follow up for obesity, DM, HTN, chol.   Ashley [name] blood pressure has been controlled at home, today their BP is BP: 138/74 She does workout. She denies chest pain, shortness of breath.  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 DMV has contacted Ashley [name] and she is filling out paperwork for driving. She is on klonopin '1mg'$  TID, follows with Dr. [name] and is going to be decreased next visit.  Ashley [name] who is 32 is here, she states that two times in the past month she has been unsteady, feeling bad, and was talking "not making sense." States trying to talk but using wrong words, transient. Has not been on ASA, has been forgetting.  She is not on cholesterol medication and denies myalgias. Ashley [name] cholesterol is at goal. The cholesterol last visit was:   Lab Results  Component Value Date   CHOL 130 03/05/2016   HDL 33 (L) 03/05/2016   LDLCALC 40 03/05/2016   TRIG 284 (H) 03/05/2016   CHOLHDL 3.9 03/05/2016   She has been working on diet and exercise for Diabetes with diabetic chronic kidney disease and with diabetic polyneuropathy, she is on bASA, she is on ACE/ARB, and denies paresthesia of the feet, polydipsia and polyuria. She is very noncompliant with Ashley [name] medications and has very poor insight to Ashley [name] DM/health.  Last A1C was:  Lab Results  Component Value Date   HGBA1C 8.9 (H) 03/05/2016   Lab Results  Component Value Date   GFRNONAA 55 (L) 03/05/2016   Patient is on Vitamin D supplement. Lab Results  Component Value Date   VD25OH 38 03/05/2016      BMI is Body mass index is 31.28 kg/m., she is working on diet and exercise. Wt Readings from Last 3 Encounters:  06/12/16  179 lb 6.4 oz (81.4 kg)  05/14/16 180 lb (81.6 kg)  04/08/16 177 lb 9.6 oz (80.6 kg)   Patient is on allopurinol for gout and does not report a recent flare.  Lab Results  Component Value Date   LABURIC 2.5 08/29/2015   She is on thyroid medication. Ashley [name] medication  was not changed last visit.   Lab Results  Component Value Date   TSH 0.91 03/05/2016  .  BMI is Body mass index is 31.28 kg/m., she is working on diet and exercise. Wt Readings from Last 3 Encounters:  06/12/16 179 lb 6.4 oz (81.4 kg)  05/14/16 180 lb (81.6 kg)  04/08/16 177 lb 9.6 oz (80.6 kg)    Medication Review Current Outpatient Prescriptions on File Prior to Visit  Medication Sig Dispense Refill  . albuterol (PROVENTIL HFA;VENTOLIN HFA) 108 (90 BASE) MCG/ACT inhaler Inhale 2 puffs into the lungs every 6 (six) hours as needed for wheezing or shortness of breath. 1 Inhaler 2  . allopurinol (ZYLOPRIM) 300 MG tablet TAKE 1 TABLET BY MOUTH EVERY MORNING. 90 tablet 1  . atorvastatin (LIPITOR) 40 MG tablet TAKE 1 TABLET BY MOUTH EVERY DAY 90 tablet 1  . clonazePAM (KLONOPIN) 1 MG tablet Take 1 mg by mouth 3 (three) times daily as needed for anxiety.    . fenofibrate (TRICOR) 145 MG tablet Take 1 tablet (145 mg total) by mouth daily. 90 tablet 4  . FLUoxetine (PROZAC) 40 MG capsule Take 1 capsule (40 mg total) by mouth daily. 90 capsule 3  . gabapentin (NEURONTIN) 300 MG capsule TAKE ONE CAPSULE BY MOUTH IN THE EVENING 90 capsule 0  . hydrochlorothiazide (HYDRODIURIL) 25 MG tablet TAKE 1 TABLET BY MOUTH EVERY OTHER DAY 90 tablet 1  . hydrocortisone cream 1 % Apply to affected area 2 times daily 15 g 0  . insulin NPH Human (NOVOLIN N) 100 UNIT/ML injection INJECT 40 UNITS EVERY MORNING AND 20 UNITS EVERY EVENING 30 mL 2  . levothyroxine (SYNTHROID, LEVOTHROID) 100 MCG tablet TAKE 1 TABLET BY MOUTH EVERY DAY 90 tablet 3  . losartan (COZAAR) 100 MG tablet Take 1 tablet daily for BP & Kidney Protection 90 tablet 4  . metFORMIN (GLUCOPHAGE) 1000 MG tablet TAKE 1 TABLET BY MOUTH TWICE A DAY WITH A MEAL 180 tablet 1  . NOVOLIN N 100 UNIT/ML injection INJECT 50 UNITS EVERY MORNING AND 25 UNITS EVERY EVENING 70 mL 1  . ONE TOUCH ULTRA TEST test strip TEST BLOOD SUGAR 3 TIMES A DAY (DX E11.9) 100  each 1  . OVER THE COUNTER MEDICATION Hair,skin and nails vitamin 1 tablet daily.    . phentermine (ADIPEX-P) 37.5 MG tablet TAKE 1/2 TO 1 TABLET BY MOUTH EVERY MORNING FOR DIETING AND WEIGHT LOSS 30 tablet 2  . polyethylene glycol (MIRALAX / GLYCOLAX) packet Take 17 g by mouth as needed.     . ranitidine (ZANTAC) 300 MG tablet TAKE 1 TABLET (300 MG TOTAL) BY MOUTH 2 (TWO) TIMES DAILY. 180 tablet 3   No current facility-administered medications on file prior to visit.     Current Problems (verified) Patient Active Problem List   Diagnosis Date Noted  . Frequent falls 04/08/2016  . Hypothyroidism 03/05/2016  . GERD (gastroesophageal reflux disease) 03/05/2016  . Insulin-requiring or dependent type II diabetes mellitus ([place]) 03/05/2016  . Poorly controlled type 2 diabetes mellitus with renal complication ([place]) 16/05/9603  . Diabetic CKD 3  (GFR 44 ml/min) (HCC) 03/05/2016  . 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 Poorly controlled type 2 diabetes mellitus with circulatory disorder ([place]) 01/16/2016  . Gout 08/29/2015  . Non compliance w medication regimen 02/20/2015  . Bipolar depression ([place]) 11/06/2014  . Vitamin D deficiency 12/01/2013  . Medication management 12/01/2013  . Abnormality of gait 03/10/2013  . Other vitamin B12 deficiency anemia 10/25/2012  . Carpal tunnel syndrome 10/25/2012  . Diabetic Peripheral Neuropathy 10/25/2012  . 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 Hyperlipidemia 10/12/2009  . Major depressive disorder, recurrent episode ([place]) 10/12/2009  . Essential hypertension 10/12/2009  . COPD 10/12/2009  . Colon Polyps 06/02/2008   Review of Systems  Constitutional: Positive for malaise/fatigue. Negative for chills, diaphoresis, fever and weight loss.  HENT: Negative.   Eyes: Negative.   Respiratory: Negative for cough, hemoptysis, sputum production, shortness of breath and wheezing.   Cardiovascular: Negative.   Gastrointestinal: Negative for  abdominal pain, blood in stool, constipation, diarrhea, heartburn, melena, nausea and vomiting.  Genitourinary: Positive for frequency. Negative for dysuria, flank pain, hematuria and urgency.  Musculoskeletal: Positive for back pain, joint pain and myalgias. Negative for falls and neck pain.  Skin: Negative.   Neurological: Positive for dizziness and sensory change. Negative for tingling, tremors, speech change, focal weakness, seizures, loss of consciousness and weakness.  Psychiatric/Behavioral: Positive for depression and memory loss. Negative for hallucinations, substance abuse and suicidal ideas. The patient is nervous/anxious. The patient does not have insomnia.       Objective:   Today's Vitals   06/12/16 1408  BP: 138/74  Pulse: (!) 101  Resp: 14  Temp: 97.3 F (36.3 C)  SpO2: 99%  Weight: 179 lb 6.4 oz (81.4 kg)  Height: 5' 3.5" (1.613 m)   Body mass index is 31.28 kg/m.  General appearance: alert, no distress, WD/WN,  female HEENT: normocephalic, sclerae anicteric, TMs pearly, nares patent, no discharge or erythema, pharynx normal Oral cavity: MMM, no lesions Neck: supple, no lymphadenopathy, no thyromegaly, no masses Heart: RRR, normal S1, S2, no murmurs Lungs: CTA bilaterally, no wheezes, rhonchi, or rales Abdomen: +bs, soft, non tender, non distended, no masses, no hepatomegaly, no splenomegaly Musculoskeletal: nontender, no swelling, no obvious deformity Extremities: no edema, no cyanosis, no clubbing Pulses: 2+ symmetric, upper and lower extremities, normal cap refill Neurological: alert, oriented x 3, CN2-12 intact, strength normal upper extremities and lower extremities, decreased sensation bilateral legs to Ashley [name] shin, DTRs 2+ throughout, gait unsteady with cane Psychiatric: flight of ideas, difficult staying focused    Vicie Mutters, PA-C   06/12/2016

## 2016-06-12 NOTE — Patient Instructions (Signed)
Add '325mg'$  aspirin with food  Please monitor your sugars  A low blood sugar is much more dangerous than a high blood sugar. Your brain needs two things, sugar and oxygen. Please call if you have  A sugar below 70/60  Please decrease klonopin   Transient Ischemic Attack A transient ischemic attack (TIA) is a "warning stroke" that causes stroke-like symptoms. Unlike a stroke, a TIA does not cause permanent damage to the brain. The symptoms of a TIA can happen very fast and do not last long. It is important to know the symptoms of a TIA and what to do. This can help prevent a major stroke or death. CAUSES  A TIA is caused by a temporary blockage in an artery in the brain or neck (carotid artery). The blockage does not allow the brain to get the blood supply it needs and can cause different symptoms. The blockage can be caused by either:  A blood clot.  Fatty buildup (plaque) in a neck or brain artery. RISK FACTORS  High blood pressure (hypertension).  High cholesterol.  Diabetes mellitus.  Heart disease.  The buildup of plaque in the blood vessels (peripheral artery disease or atherosclerosis).  The buildup of plaque in the blood vessels that provide blood and oxygen to the brain (carotid artery stenosis).  An abnormal heart rhythm (atrial fibrillation).  Obesity.  Using any tobacco products, including cigarettes, chewing tobacco, or electronic cigarettes.  Taking oral contraceptives, especially in combination with using tobacco.  Physical inactivity.  A diet high in fats, salt (sodium), and calories.  Excessive alcohol use.  Use of illegal drugs (especially cocaine and methamphetamine).  Being female.  Being African American.  Being over the age of 41 years.  Family history of stroke.  Previous history of blood clots, stroke, TIA, or heart attack.  Sickle cell disease. SIGNS AND SYMPTOMS  TIA symptoms are the same as a stroke but are temporary. These symptoms  usually develop suddenly, or may be newly present upon waking from sleep:  Sudden weakness or numbness of the face, arm, or leg, especially on one side of the body.  Sudden trouble walking or difficulty moving arms or legs.  Sudden confusion.  Sudden personality changes.  Trouble speaking (aphasia) or understanding.  Difficulty swallowing.  Sudden trouble seeing in one or both eyes.  Double vision.  Dizziness.  Loss of balance or coordination.  Sudden severe headache with no known cause.  Trouble reading or writing.  Loss of bowel or bladder control.  Loss of consciousness. DIAGNOSIS  Your health care provider may be able to determine the presence or absence of a TIA based on your symptoms, history, and physical exam. CT scan of the brain is usually performed to help identify a TIA. Other tests may include:  Electrocardiography (ECG).  Continuous heart monitoring.  Echocardiography.  Carotid ultrasonography.  MRI.  A scan of the brain circulation.  Blood tests. TREATMENT  Since the symptoms of TIA are the same as a stroke, it is important to seek treatment as soon as possible. You may need a medicine to dissolve a blood clot (thrombolytic) if that is the cause of the TIA. This medicine cannot be given if too much time has passed. Treatment may also include:   Rest, oxygen, fluids through an IV tube, and medicines to thin the blood (anticoagulants).  Measures will be taken to prevent short-term and long-term complications, including infection from breathing foreign material into the lungs (aspiration pneumonia), blood clots in the  legs, and falls.  Procedures to either remove plaque in the carotid arteries or dilate carotid arteries that have narrowed due to plaque. Those procedures are:  Carotid endarterectomy.  Carotid angioplasty and stenting.  Medicines and diet may be used to address diabetes, high blood pressure, and other underlying risk factors. HOME  CARE INSTRUCTIONS   Take medicines only as directed by your health care provider. Follow the directions carefully. Medicines may be used to control risk factors for a stroke. Be sure you understand all your medicine instructions.  You may be told to take aspirin or the anticoagulant warfarin. Warfarin needs to be taken exactly as instructed.  Taking too much or too little warfarin is dangerous. Too much warfarin increases the risk of bleeding. Too little warfarin continues to allow the risk for blood clots. While taking warfarin, you will need to have regular blood tests to measure your blood clotting time. A PT blood test measures how long it takes for blood to clot. Your PT is used to calculate another value called an INR. Your PT and INR help your health care provider to adjust your dose of warfarin. The dose can change for many reasons. It is critically important that you take warfarin exactly as prescribed.  Many foods, especially foods high in vitamin K can interfere with warfarin and affect the PT and INR. Foods high in vitamin K include spinach, kale, broccoli, cabbage, collard and turnip greens, Brussels sprouts, peas, cauliflower, seaweed, and parsley, as well as beef and pork liver, green tea, and soybean oil. You should eat a consistent amount of foods high in vitamin K. Avoid major changes in your diet, or notify your health care provider before changing your diet. Arrange a visit with a dietitian to answer your questions.  Many medicines can interfere with warfarin and affect the PT and INR. You must tell your health care provider about any and all medicines you take; this includes all vitamins and supplements. Be especially cautious with aspirin and anti-inflammatory medicines. Do not take or discontinue any prescribed or over-the-counter medicine except on the advice of your health care provider or pharmacist.  Warfarin can have side effects, such as excessive bruising or bleeding. You  will need to hold pressure over cuts for longer than usual. Your health care provider or pharmacist will discuss other potential side effects.  Avoid sports or activities that may cause injury or bleeding.  Be careful when shaving, flossing your teeth, or handling sharp objects.  Alcohol can change the body's ability to handle warfarin. It is best to avoid alcoholic drinks or consume only very small amounts while taking warfarin. Notify your health care provider if you change your alcohol intake.  Notify your dentist or other health care providers before procedures.  Eat a diet that includes 5 or more servings of fruits and vegetables each day. This may reduce the risk of stroke. Certain diets may be prescribed to address high blood pressure, high cholesterol, diabetes, or obesity.  A diet low in sodium, saturated fat, trans fat, and cholesterol is recommended to manage high blood pressure.  A diet low in saturated fat, trans fat, and cholesterol, and high in fiber may control cholesterol levels.  A controlled-carbohydrate, controlled-sugar diet is recommended to manage diabetes.  A reduced-calorie diet that is low in sodium, saturated fat, trans fat, and cholesterol is recommended to manage obesity.  Maintain a healthy weight.  Stay physically active. It is recommended that you get at least 30 minutes of  activity on most or all days.  Do not use any tobacco products, including cigarettes, chewing tobacco, or electronic cigarettes. If you need help quitting, ask your health care provider.  Limit alcohol intake to no more than 1 drink per day for nonpregnant women and 2 drinks per day for men. One drink equals 12 ounces of beer, 5 ounces of wine, or 1 ounces of hard liquor.  Do not abuse drugs.  A safe home environment is important to reduce the risk of falls. Your health care provider may arrange for specialists to evaluate your home. Having grab bars in the bedroom and bathroom is  often important. Your health care provider may arrange for equipment to be used at home, such as raised toilets and a seat for the shower.  Follow all instructions for follow-up with your health care provider. This is very important. This includes any referrals and lab tests. Proper follow-up can prevent a stroke or another TIA from occurring. PREVENTION  The risk of a TIA can be decreased by appropriately treating high blood pressure, high cholesterol, diabetes, heart disease, and obesity, and by quitting smoking, limiting alcohol, and staying physically active. SEEK MEDICAL CARE IF:  You have personality changes.  You have difficulty swallowing.  You are seeing double.  You have dizziness.  You have a fever. SEEK IMMEDIATE MEDICAL CARE IF:  Any of the following symptoms may represent a serious problem that is an emergency. Do not wait to see if the symptoms will go away. Get medical help right away. Call your local emergency services (911 in [place] Do not drive yourself to the hospital.  You have sudden weakness or numbness of the face, arm, or leg, especially on one side of the body.  You have sudden trouble walking or difficulty moving arms or legs.  You have sudden confusion.  You have trouble speaking (aphasia) or understanding.  You have sudden trouble seeing in one or both eyes.  You have a loss of balance or coordination.  You have a sudden, severe headache with no known cause.  You have new chest pain or an irregular heartbeat.  You have a partial or total loss of consciousness. MAKE SURE YOU:   Understand these instructions.  Will watch your condition.  Will get help right away if you are not doing well or get worse.   This information is not intended to replace advice given to you by your health care provider. Make sure you discuss any questions you have with your health care provider.   Document Released: 05/14/2005 Document Revised: 08/25/2014 Document  Reviewed: 11/09/2013 Elsevier Interactive Patient Education [employer].

## 2016-06-13 ENCOUNTER — Other Ambulatory Visit: Payer: PPO

## 2016-06-13 DIAGNOSIS — R2689 Other abnormalities of gait and mobility: Secondary | ICD-10-CM

## 2016-06-13 DIAGNOSIS — N390 Urinary tract infection, site not specified: Secondary | ICD-10-CM | POA: Diagnosis not present

## 2016-06-13 LAB — URINALYSIS, ROUTINE W REFLEX MICROSCOPIC

## 2016-06-13 LAB — AMMONIA: Ammonia: 37 umol/L (ref <=47)

## 2016-06-14 LAB — URINALYSIS, ROUTINE W REFLEX MICROSCOPIC
Bilirubin Urine: NEGATIVE
Hgb urine dipstick: NEGATIVE
Ketones, ur: NEGATIVE
Leukocytes, UA: NEGATIVE
Nitrite: NEGATIVE
Specific Gravity, Urine: 1.012 (ref 1.001–1.035)
pH: 6 (ref 5.0–8.0)

## 2016-06-14 LAB — URINALYSIS, MICROSCOPIC ONLY
Casts: NONE SEEN [LPF]
Crystals: NONE SEEN [HPF]
Squamous Epithelial / LPF: NONE SEEN [HPF] (ref <=5)
Yeast: NONE SEEN [HPF]

## 2016-06-16 ENCOUNTER — Other Ambulatory Visit: Payer: Self-pay | Admitting: Physician Assistant

## 2016-06-16 LAB — URINE CULTURE

## 2016-06-16 MED ORDER — LEVOFLOXACIN 500 MG PO TABS: 500.0000 mg | ORAL_TABLET | Freq: Every day | ORAL | 0 refills | Status: DC

## 2016-06-19 ENCOUNTER — Other Ambulatory Visit: Payer: Self-pay | Admitting: Internal Medicine

## 2016-06-20 NOTE — Telephone Encounter (Signed)
Pt has paperwork up front completed waiting on her.

## 2016-06-22 ENCOUNTER — Encounter: Payer: Self-pay | Admitting: *Deleted

## 2016-06-23 ENCOUNTER — Other Ambulatory Visit: Payer: Self-pay | Admitting: *Deleted

## 2016-06-23 MED ORDER — GLUCOSE BLOOD VI STRP: ORAL_STRIP | 1 refills | Status: DC

## 2016-06-24 ENCOUNTER — Ambulatory Visit: Payer: PPO | Admitting: Internal Medicine

## 2016-06-24 ENCOUNTER — Ambulatory Visit (INDEPENDENT_AMBULATORY_CARE_PROVIDER_SITE_OTHER): Payer: PPO | Admitting: Internal Medicine

## 2016-06-24 ENCOUNTER — Other Ambulatory Visit: Payer: Self-pay | Admitting: *Deleted

## 2016-06-24 ENCOUNTER — Encounter: Payer: Self-pay | Admitting: Internal Medicine

## 2016-06-24 VITALS — BP 110/80 | HR 92 | Ht 63.5 in | Wt 181.0 lb

## 2016-06-24 DIAGNOSIS — E1165 Type 2 diabetes mellitus with hyperglycemia: Secondary | ICD-10-CM | POA: Diagnosis not present

## 2016-06-24 DIAGNOSIS — E1159 Type 2 diabetes mellitus with other circulatory complications: Secondary | ICD-10-CM | POA: Diagnosis not present

## 2016-06-24 MED ORDER — INSULIN NPH (HUMAN) (ISOPHANE) 100 UNIT/ML ~~LOC~~ SUSP: SUBCUTANEOUS | 2 refills | Status: DC

## 2016-06-24 MED ORDER — GLUCOSE BLOOD VI STRP: ORAL_STRIP | 1 refills | Status: DC

## 2016-06-24 MED ORDER — GLIPIZIDE ER 5 MG PO TB24: 5.0000 mg | ORAL_TABLET | Freq: Every day | ORAL | 2 refills | Status: DC

## 2016-06-24 NOTE — Patient Instructions (Addendum)
Please continue: - Metformin 1000 mg 2x a day, with meals  Please change: - NPH to 45 units in am and 15 units at bedtime  Please restart:  - Glipizide ER 5 mg in am, 15 min before breakfast and 2 halves of the Glipizide ER 5 mg before dinner   Please return in 3 months with your sugar log.
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
## 2016-07-02 DIAGNOSIS — G319 Degenerative disease of nervous system, unspecified: Secondary | ICD-10-CM | POA: Diagnosis not present

## 2016-07-02 DIAGNOSIS — I671 Cerebral aneurysm, nonruptured: Secondary | ICD-10-CM | POA: Diagnosis not present

## 2016-07-02 DIAGNOSIS — Z8673 Personal history of transient ischemic attack (TIA), and cerebral infarction without residual deficits: Secondary | ICD-10-CM | POA: Diagnosis not present

## 2016-07-04 ENCOUNTER — Telehealth: Payer: Self-pay | Admitting: Internal Medicine

## 2016-07-04 NOTE — Telephone Encounter (Signed)
Patient brought in CD from [place] at [facility] at [facility]. Put in chart.

## 2016-07-07 ENCOUNTER — Other Ambulatory Visit: Payer: Self-pay | Admitting: Physician Assistant

## 2016-07-07 ENCOUNTER — Telehealth: Payer: Self-pay | Admitting: *Deleted

## 2016-07-07 DIAGNOSIS — R2689 Other abnormalities of gait and mobility: Secondary | ICD-10-CM

## 2016-07-07 DIAGNOSIS — R4701 Aphasia: Secondary | ICD-10-CM

## 2016-07-07 NOTE — Telephone Encounter (Signed)
Patient aware of MRI/MRA results and referral to Neurology referral in process.

## 2016-07-26 ENCOUNTER — Other Ambulatory Visit: Payer: Self-pay | Admitting: Physician Assistant

## 2016-08-14 ENCOUNTER — Encounter: Payer: Self-pay | Admitting: Neurology

## 2016-08-14 ENCOUNTER — Ambulatory Visit (INDEPENDENT_AMBULATORY_CARE_PROVIDER_SITE_OTHER): Payer: PPO | Admitting: Neurology

## 2016-08-14 VITALS — BP 158/92 | HR 95 | Ht 63.0 in | Wt 183.0 lb

## 2016-08-14 DIAGNOSIS — R413 Other amnesia: Secondary | ICD-10-CM | POA: Diagnosis not present

## 2016-08-14 DIAGNOSIS — G63 Polyneuropathy in diseases classified elsewhere: Secondary | ICD-10-CM

## 2016-08-14 DIAGNOSIS — E538 Deficiency of other specified B group vitamins: Secondary | ICD-10-CM | POA: Diagnosis not present

## 2016-08-14 DIAGNOSIS — R269 Unspecified abnormalities of gait and mobility: Secondary | ICD-10-CM

## 2016-08-14 HISTORY — DX: Other amnesia: R41.3
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
## 2016-08-14 NOTE — Patient Instructions (Addendum)
Fall Prevention in the Home Falls can cause injuries and can affect people from all age groups. There are many simple things that you can do to make your home safe and to help prevent falls. What can I do on the outside of my home?  Regularly repair the edges of walkways and driveways and fix any cracks.  Remove high doorway thresholds.  Trim any shrubbery on the main path into your home.  Use bright outdoor lighting.  Clear walkways of debris and clutter, including tools and rocks.  Regularly check that handrails are securely fastened and in good repair. Both sides of any steps should have handrails.  Install guardrails along the edges of any raised decks or porches.  Have leaves, snow, and ice cleared regularly.  Use sand or salt on walkways during winter months.  In the garage, clean up any spills right away, including grease or oil spills. What can I do in the bathroom?  Use night lights.  Install grab bars by the toilet and in the tub and shower. Do not use towel bars as grab bars.  Use non-skid mats or decals on the floor of the tub or shower.  If you need to sit down while you are in the shower, use a plastic, non-slip stool.  Keep the floor dry. Immediately clean up any water that spills on the floor.  Remove soap buildup in the tub or shower on a regular basis.  Attach bath mats securely with double-sided non-slip rug tape.  Remove throw rugs and other tripping hazards from the floor. What can I do in the bedroom?  Use night lights.  Make sure that a bedside light is easy to reach.  Do not use oversized bedding that drapes onto the floor.  Have a firm chair that has side arms to use for getting dressed.  Remove throw rugs and other tripping hazards from the floor. What can I do in the kitchen?  Clean up any spills right away.  Avoid walking on wet floors.  Place frequently used items in easy-to-reach places.  If you need to reach for something above  you, use a sturdy step stool that has a grab bar.  Keep electrical cables out of the way.  Do not use floor polish or wax that makes floors slippery. If you have to use wax, make sure that it is non-skid floor wax.  Remove throw rugs and other tripping hazards from the floor. What can I do in the stairways?  Do not leave any items on the stairs.  Make sure that there are handrails on both sides of the stairs. Fix handrails that are broken or loose. Make sure that handrails are as long as the stairways.  Check any carpeting to make sure that it is firmly attached to the stairs. Fix any carpet that is loose or worn.  Avoid having throw rugs at the top or bottom of stairways, or secure the rugs with carpet tape to prevent them from moving.  Make sure that you have a light switch at the top of the stairs and the bottom of the stairs. If you do not have them, have them installed. What are some other fall prevention tips?  Wear closed-toe shoes that fit well and support your feet. Wear shoes that have rubber soles or low heels.  When you use a stepladder, make sure that it is completely opened and that the sides are firmly locked. Have someone hold the ladder while you are using   it. Do not climb a closed stepladder.  Add color or contrast paint or tape to grab bars and handrails in your home. Place contrasting color strips on the first and last steps.  Use mobility aids as needed, such as canes, walkers, scooters, and crutches.  Turn on lights if it is dark. Replace any light bulbs that burn out.  Set up furniture so that there are clear paths. Keep the furniture in the same spot.  Fix any uneven floor surfaces.  Choose a carpet design that does not hide the edge of steps of a stairway.  Be aware of any and all pets.  Review your medicines with your healthcare provider. Some medicines can cause dizziness or changes in blood pressure, which increase your risk of falling. Talk with  your health care provider about other ways that you can decrease your risk of falls. This may include working with a physical therapist or trainer to improve your strength, balance, and endurance. This information is not intended to replace advice given to you by your health care provider. Make sure you discuss any questions you have with your health care provider. Document Released: 07/25/2002 Document Revised: 01/01/2016 Document Reviewed: 09/08/2014 Elsevier Interactive Patient Education  2017 [employer] Inc.  

## 2016-08-16 LAB — VITAMIN B12: Vitamin B-12: 477 pg/mL (ref 232–1245)

## 2016-08-16 LAB — COPPER, SERUM: Copper: 126 ug/dL (ref 72–166)

## 2016-08-16 LAB — RPR: RPR Ser Ql: NONREACTIVE

## 2016-08-18 ENCOUNTER — Other Ambulatory Visit: Payer: Self-pay | Admitting: Internal Medicine

## 2016-08-18 DIAGNOSIS — E1140 Type 2 diabetes mellitus with diabetic neuropathy, unspecified: Secondary | ICD-10-CM

## 2016-08-20 ENCOUNTER — Telehealth: Payer: Self-pay

## 2016-08-20 NOTE — Telephone Encounter (Signed)
Called pt w/ unremarkable lab results. Verbalized understanding and appreciation for call.

## 2016-08-20 NOTE — Telephone Encounter (Signed)
-----   Message from Kathrynn Ducking, MD sent at 08/19/2016 12:04 PM EST -----  The blood work results are unremarkable. Please call the patient.  ----- Message ----- From: Lavone Neri Lab Results In Sent: 08/15/2016   7:40 AM To: Kathrynn Ducking, MD

## 2016-08-23 ENCOUNTER — Emergency Department (HOSPITAL_COMMUNITY)
Admission: EM | Admit: 2016-08-23 | Discharge: 2016-08-23 | Disposition: A | Discharge status: Home / Self Care | Payer: PPO | Attending: Emergency Medicine | Admitting: Emergency Medicine

## 2016-08-23 ENCOUNTER — Emergency Department (HOSPITAL_COMMUNITY): Payer: PPO

## 2016-08-23 ENCOUNTER — Encounter (HOSPITAL_COMMUNITY): Payer: Self-pay | Admitting: Emergency Medicine

## 2016-08-23 DIAGNOSIS — R4781 Slurred speech: Secondary | ICD-10-CM | POA: Diagnosis not present

## 2016-08-23 DIAGNOSIS — R29818 Other symptoms and signs involving the nervous system: Secondary | ICD-10-CM | POA: Diagnosis not present

## 2016-08-23 DIAGNOSIS — R55 Syncope and collapse: Secondary | ICD-10-CM

## 2016-08-23 DIAGNOSIS — E039 Hypothyroidism, unspecified: Secondary | ICD-10-CM | POA: Insufficient documentation

## 2016-08-23 DIAGNOSIS — E1122 Type 2 diabetes mellitus with diabetic chronic kidney disease: Secondary | ICD-10-CM | POA: Diagnosis not present

## 2016-08-23 DIAGNOSIS — F17210 Nicotine dependence, cigarettes, uncomplicated: Secondary | ICD-10-CM | POA: Insufficient documentation

## 2016-08-23 DIAGNOSIS — N183 Chronic kidney disease, stage 3 (moderate): Secondary | ICD-10-CM | POA: Insufficient documentation

## 2016-08-23 DIAGNOSIS — E1140 Type 2 diabetes mellitus with diabetic neuropathy, unspecified: Secondary | ICD-10-CM | POA: Diagnosis not present

## 2016-08-23 DIAGNOSIS — Z79899 Other long term (current) drug therapy: Secondary | ICD-10-CM | POA: Insufficient documentation

## 2016-08-23 DIAGNOSIS — I6789 Other cerebrovascular disease: Secondary | ICD-10-CM | POA: Diagnosis not present

## 2016-08-23 DIAGNOSIS — E871 Hypo-osmolality and hyponatremia: Secondary | ICD-10-CM | POA: Insufficient documentation

## 2016-08-23 DIAGNOSIS — I129 Hypertensive chronic kidney disease with stage 1 through stage 4 chronic kidney disease, or unspecified chronic kidney disease: Secondary | ICD-10-CM | POA: Insufficient documentation

## 2016-08-23 DIAGNOSIS — E860 Dehydration: Secondary | ICD-10-CM | POA: Diagnosis not present

## 2016-08-23 DIAGNOSIS — Z794 Long term (current) use of insulin: Secondary | ICD-10-CM | POA: Insufficient documentation

## 2016-08-23 DIAGNOSIS — J449 Chronic obstructive pulmonary disease, unspecified: Secondary | ICD-10-CM | POA: Insufficient documentation

## 2016-08-23 DIAGNOSIS — Z5181 Encounter for therapeutic drug level monitoring: Secondary | ICD-10-CM | POA: Diagnosis not present

## 2016-08-23 DIAGNOSIS — R42 Dizziness and giddiness: Secondary | ICD-10-CM | POA: Diagnosis not present

## 2016-08-23 DIAGNOSIS — R296 Repeated falls: Secondary | ICD-10-CM

## 2016-08-23 LAB — I-STAT TROPONIN, ED: Troponin i, poc: 0 ng/mL (ref 0.00–0.08)

## 2016-08-23 LAB — I-STAT CHEM 8, ED
BUN: 11 mg/dL (ref 6–20)
Calcium, Ion: 1.1 mmol/L — ABNORMAL LOW (ref 1.15–1.40)
Chloride: 92 mmol/L — ABNORMAL LOW (ref 101–111)
Creatinine, Ser: 0.7 mg/dL (ref 0.44–1.00)
Glucose, Bld: 137 mg/dL — ABNORMAL HIGH (ref 65–99)
HCT: 42 % (ref 36.0–46.0)
Hemoglobin: 14.3 g/dL (ref 12.0–15.0)
Potassium: 3.8 mmol/L (ref 3.5–5.1)
Sodium: 129 mmol/L — ABNORMAL LOW (ref 135–145)
TCO2: 22 mmol/L (ref 0–100)

## 2016-08-23 LAB — DIFFERENTIAL
Basophils Absolute: 0 10*3/uL (ref 0.0–0.1)
Basophils Relative: 0 %
Eosinophils Absolute: 0.2 10*3/uL (ref 0.0–0.7)
Eosinophils Relative: 2 %
Lymphocytes Relative: 20 %
Lymphs Abs: 1.8 10*3/uL (ref 0.7–4.0)
Monocytes Absolute: 0.6 10*3/uL (ref 0.1–1.0)
Monocytes Relative: 7 %
Neutro Abs: 6.4 10*3/uL (ref 1.7–7.7)
Neutrophils Relative %: 71 %

## 2016-08-23 LAB — CBC
HCT: 38.2 % (ref 36.0–46.0)
Hemoglobin: 13.4 g/dL (ref 12.0–15.0)
MCH: 30.4 pg (ref 26.0–34.0)
MCHC: 35.1 g/dL (ref 30.0–36.0)
MCV: 86.6 fL (ref 78.0–100.0)
Platelets: 311 10*3/uL (ref 150–400)
RBC: 4.41 MIL/uL (ref 3.87–5.11)
RDW: 13.2 % (ref 11.5–15.5)
WBC: 9 10*3/uL (ref 4.0–10.5)

## 2016-08-23 LAB — PROTIME-INR
INR: 0.88
Prothrombin Time: 12 seconds (ref 11.4–15.2)

## 2016-08-23 LAB — CBG MONITORING, ED: Glucose-Capillary: 131 mg/dL — ABNORMAL HIGH (ref 65–99)

## 2016-08-23 LAB — APTT: aPTT: 29 seconds (ref 24–36)

## 2016-08-23 MED ORDER — SODIUM CHLORIDE 0.9 % IV BOLUS (SEPSIS)
1000.0000 mL | Freq: Once | INTRAVENOUS | Status: AC
  Administered 2016-08-23: 1000 mL via INTRAVENOUS

## 2016-08-23 NOTE — Consult Note (Signed)
Neurology Consultation Reason for Consult: Presyncope Referring Physician: Little, R(called as code stroke by EMS)  CC: I felt like was about to die  History is obtained from: Patient  HPI: Ashley [name] is a 71 y.o. female who stood up earlier and walked outside, and after should been sitting a prolonged Very lightheaded and felt like "I was about to die." She got very lightheaded and went back inside and sat down. She drinks some orange to say some crackers and began feeling much better. She had no lateralizing symptoms.   LKW: 12:20 PM tpa given?: no, not stroke    ROS: A 14 point ROS was performed and is negative except as noted in the HPI.   Past Medical History:  Diagnosis Date  . Anxiety disorder   . Arthritis LOWER BACK  . Asthma   . Bipolar 1 disorder ([place])   . Carpal tunnel syndrome   . Depression   . Diabetes mellitus   . Dyslipidemia   . Gait disorder   . GERD (gastroesophageal reflux disease)   . Gout LEFT FOOT-  STABLE  . H/O hiatal hernia   . History of CVA (cerebrovascular accident) FOUND PER MRI 1994--  RESIDUAL MEMORY IMPAIRED  . Hypercholesteremia   . Hypertension   . Memory difficulty 08/14/2016  . Neuropathy of lower extremity   . OCD (obsessive compulsive disorder)   . Peripheral neuropathy ([place])   . SOB (shortness of breath) on exertion   . SUI (stress urinary incontinence, female)   . Vulvar lesion      Family History  Problem Relation Age of Onset  . Stroke Father   . Heart failure Father   . Heart disease Father   . Hyperlipidemia Father   . Hypertension Father   . Hypertension Brother   . Hyperlipidemia Mother      Social History:  reports that she has been smoking Cigarettes.  She has a 54.00 pack-year smoking history. She has never used smokeless tobacco. She reports that she does not drink alcohol or use drugs.   Exam: Current vital signs: BP 148/82   Pulse 94   Temp 98.1 F (36.7 C)   Resp 20   Ht '5\' 5"'$  (1.651 m)   Wt  84.1 kg (185 lb 6.5 oz)   SpO2 98%   BMI 30.85 kg/m  Vital signs in last 24 hours: Temp:  [98.1 F (36.7 C)] 98.1 F (36.7 C) (01/06 1612) Pulse Rate:  [94-99] 94 (01/06 1630) Resp:  [14-22] 20 (01/06 1630) BP: (142-159)/(78-93) 148/82 (01/06 1630) SpO2:  [96 %-100 %] 98 % (01/06 1630) Weight:  [84.1 kg (185 lb 6.5 oz)] 84.1 kg (185 lb 6.5 oz) (01/06 1421)   Physical Exam  Constitutional: Appears well-developed and well-nourished.  Psych: Affect appropriate to situation Eyes: No scleral injection HENT: No OP obstrucion Head: Normocephalic.  Cardiovascular: Normal rate and regular rhythm.  Respiratory: Effort normal and breath sounds normal to anterior ascultation GI: Soft.  No distension. There is no tenderness.  Skin: WDI  Neuro: Mental Status: Patient is awake, alert, oriented to person, place, month, year, and situation. Patient is able to give a clear and coherent history. No signs of aphasia or neglect Cranial Nerves: II: Visual Fields are full. Pupils are equal, round, and reactive to light.   III,IV, VI: EOMI without ptosis or diploplia.  V: Facial sensation is symmetric to temperature VII: Facial movement is symmetric.  VIII: hearing is intact to voice X: Uvula elevates symmetrically  XI: Shoulder shrug is symmetric. XII: tongue is midline without atrophy or fasciculations.  Motor: Tone is normal. Bulk is normal. 5/5 strength was present in all four extremities.  Sensory: Sensation is symmetric to light touch and temperature in the arms and legs. Cerebellar: FNF and HKS are intact bilaterally   I have reviewed labs in epic and the results pertinent to this consultation are: Mild hyponatremia  I have reviewed the images obtained: CT head-negative  Impression: 71 year old female with symptoms consistent with presyncope.  Recommendations: 1) further workup per ER physicians.   Roland Rack, MD [facility] [phone]  If 7pm- 7am,  please page neurology on call as listed in [place].

## 2016-08-23 NOTE — ED Triage Notes (Signed)
Pt in from home via [facility] with bilateral leg weakness that began yesterday, worsened today. Pt states the slurred speech began today at 1220. Pt is a&ox3 with some periods of confusion. MAE's equally on arrival. CBG 145 for EMS
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
## 2016-08-23 NOTE — ED Notes (Signed)
Pt verbalized understanding of d/c instructions and has no further questions. Pt stable and NAD. Neuro completely in tact. NIH 0, pt moved to pod C to finish fluid bolus and to be d/c home thereafter. VSS.

## 2016-08-23 NOTE — ED Notes (Signed)
Pt ambulated to end of hallway and back while holding this RN's hand. Pt observed to bed steady on her feet.

## 2016-09-02 ENCOUNTER — Ambulatory Visit (INDEPENDENT_AMBULATORY_CARE_PROVIDER_SITE_OTHER): Payer: PPO | Admitting: Internal Medicine

## 2016-09-02 ENCOUNTER — Encounter: Payer: Self-pay | Admitting: Internal Medicine

## 2016-09-02 VITALS — BP 144/82 | HR 106 | Temp 98.2°F | Resp 18 | Ht 63.0 in | Wt 180.0 lb

## 2016-09-02 DIAGNOSIS — E039 Hypothyroidism, unspecified: Secondary | ICD-10-CM | POA: Diagnosis not present

## 2016-09-02 DIAGNOSIS — Z79899 Other long term (current) drug therapy: Secondary | ICD-10-CM

## 2016-09-02 DIAGNOSIS — G63 Polyneuropathy in diseases classified elsewhere: Secondary | ICD-10-CM

## 2016-09-02 DIAGNOSIS — F3130 Bipolar disorder, current episode depressed, mild or moderate severity, unspecified: Secondary | ICD-10-CM | POA: Diagnosis not present

## 2016-09-02 DIAGNOSIS — R296 Repeated falls: Secondary | ICD-10-CM | POA: Diagnosis not present

## 2016-09-02 DIAGNOSIS — F319 Bipolar disorder, unspecified: Secondary | ICD-10-CM

## 2016-09-02 LAB — CBC WITH DIFFERENTIAL/PLATELET
Basophils Absolute: 0 cells/uL (ref 0–200)
Basophils Relative: 0 %
Eosinophils Absolute: 172 cells/uL (ref 15–500)
Eosinophils Relative: 2 %
HCT: 39.6 % (ref 35.0–45.0)
Hemoglobin: 12.9 g/dL (ref 11.7–15.5)
Lymphocytes Relative: 28 %
Lymphs Abs: 2408 cells/uL (ref 850–3900)
MCH: 30.1 pg (ref 27.0–33.0)
MCHC: 32.6 g/dL (ref 32.0–36.0)
MCV: 92.3 fL (ref 80.0–100.0)
MPV: 9.2 fL (ref 7.5–12.5)
Monocytes Absolute: 602 cells/uL (ref 200–950)
Monocytes Relative: 7 %
Neutro Abs: 5418 cells/uL (ref 1500–7800)
Neutrophils Relative %: 63 %
Platelets: 296 10*3/uL (ref 140–400)
RBC: 4.29 MIL/uL (ref 3.80–5.10)
RDW: 13.8 % (ref 11.0–15.0)
WBC: 8.6 10*3/uL (ref 3.8–10.8)

## 2016-09-02 LAB — TSH: TSH: 1.56 mIU/L

## 2016-09-02 NOTE — Progress Notes (Signed)
Assessment and Plan:  Patient hospitalized for orthostatic hypotension and falls.  Patient and I had a very long discussion about her going to physical therapy to avoid any future falls which she vehemently declined.  She reports that she is not willing and never will be willing to do any physical therapy.  She does not think that this will help her at all.  She is also not eating or drinking to suit her diabetic diet.  She is ready to "give up".  She did have low sodium level in the hospital which we will recheck here in the office today.  Not sure if this derangement is secondary to her chronic long standing diabetes or whether it is poor intake.  She is eating a large amount of fast foods.  Will continue to speak with her regularly about opportunities for physical therapy.    Over 40 minutes of exam, counseling, chart review, and complex, high/moderate level critical decision making was performed this visit.   HPI 71 y.o.female presents for follow up from the hospital. Patient was admitted to the ER for evaluation of fall and also for what they thought was a code stroke.  She reports that she she was dizzy and was having a hard time with her balance and gait.  She has been given multiple different prescriptions to do physical therapy which she declined.  She does have a history of peripheral neuropathy as well.  She has been evaluated by Dr. Jannifer Franklin who also recommended that she do physical therapy.  She reports that she does not think that therapy will benefit her or work for her.  She had normal CT normal neurological exam.  She was noted to have orthostatic hypotension and was given a 1L of NS with full improvement.  She reports that she is not drinking anything but diet coke at home.  She is willing to drink more water.     Images while in the hospital: Ct Head Code Stroke W/o Cm  Result Date: 08/23/2016 CLINICAL DATA:  Code stroke.  Slurred speech EXAM: CT HEAD WITHOUT CONTRAST TECHNIQUE:  Contiguous axial images were obtained from the base of the skull through the vertex without intravenous contrast. COMPARISON:  CT head 11/15/2013 FINDINGS: Brain: Generalized atrophy with progression since the prior study. Mild periventricular white matter hypodensity. Chronic left frontal infarct over the convexity is unchanged. Negative for acute infarct. Negative for intracranial hemorrhage. Negative for mass or edema. No shift of the midline structures. Vascular: No hyperdense vessel or unexpected calcification. Skull: Negative Sinuses/Orbits: Negative Other: None ASPECTS ([place] Stroke Program Early CT Score) - Ganglionic level infarction (caudate, lentiform nuclei, internal capsule, insula, M1-M3 cortex): 7 - Supraganglionic infarction (M4-M6 cortex): 3 Total score (0-10 with 10 being normal): 10 IMPRESSION: 1. No acute abnormality 2. Atrophy with chronic microvascular ischemia and chronic infarct in the high left frontal lobe over the convexity. 3. ASPECTS is 10 These results were called by telephone at the time of interpretation on 08/23/2016 at 2:18 pm to Dr. Leonel Ramsay, who verbally acknowledged these results. Electronically Signed   By: Franchot Gallo M.D.   On: 08/23/2016 14:19    Past Medical History:  Diagnosis Date  . Anxiety disorder   . Arthritis LOWER BACK  . Asthma   . Bipolar 1 disorder ([place])   . Carpal tunnel syndrome   . Depression   . Diabetes mellitus   . Dyslipidemia   . Gait disorder   . GERD (gastroesophageal reflux disease)   .  Gout LEFT FOOT-  STABLE  . H/O hiatal hernia   . History of CVA (cerebrovascular accident) FOUND PER MRI 1994--  RESIDUAL MEMORY IMPAIRED  . Hypercholesteremia   . Hypertension   . Memory difficulty 08/14/2016  . Neuropathy of lower extremity   . OCD (obsessive compulsive disorder)   . Peripheral neuropathy ([place])   . SOB (shortness of breath) on exertion   . SUI (stress urinary incontinence, female)   . Vulvar lesion      Allergies   Allergen Reactions  . Sulfa Antibiotics Other (See Comments)    Pt states she had "extreme pain"  . Sulfacetamide Sodium       Current Outpatient Prescriptions on File Prior to Visit  Medication Sig Dispense Refill  . allopurinol (ZYLOPRIM) 300 MG tablet TAKE 1 TABLET BY MOUTH EVERY MORNING. 90 tablet 1  . atorvastatin (LIPITOR) 40 MG tablet TAKE 1 TABLET BY MOUTH EVERY DAY 90 tablet 1  . clonazePAM (KLONOPIN) 1 MG tablet Take 1 mg by mouth 3 (three) times daily as needed for anxiety.    Marland Kitchen FLUoxetine (PROZAC) 40 MG capsule Take 1 capsule (40 mg total) by mouth daily. 90 capsule 3  . gabapentin (NEURONTIN) 300 MG capsule TAKE ONE CAPSULE BY MOUTH IN THE EVENING 90 capsule 1  . glipiZIDE (GLUCOTROL XL) 5 MG 24 hr tablet Take 1 tablet (5 mg total) by mouth daily with breakfast. And 1 before dinner. 60 tablet 2  . glucose blood (ONE TOUCH ULTRA TEST) test strip TEST BLOOD SUGAR 3 TIMES A DAY (DX E11.9) 100 each 1  . hydrochlorothiazide (HYDRODIURIL) 25 MG tablet TAKE 1 TABLET BY MOUTH EVERY OTHER DAY 90 tablet 1  . insulin NPH Human (NOVOLIN N) 100 UNIT/ML injection INJECT 45 UNITS EVERY MORNING AND 15 UNITS EVERY EVENING 30 mL 2  . levothyroxine (SYNTHROID, LEVOTHROID) 100 MCG tablet TAKE 1 TABLET BY MOUTH EVERY DAY 90 tablet 3  . losartan (COZAAR) 100 MG tablet Take 1 tablet daily for BP & Kidney Protection 90 tablet 4  . metFORMIN (GLUCOPHAGE) 1000 MG tablet TAKE 1 TABLET BY MOUTH TWICE A DAY WITH A MEAL 180 tablet 1  . OVER THE COUNTER MEDICATION Hair,skin and nails vitamin 1 tablet daily.    . phentermine (ADIPEX-P) 37.5 MG tablet TAKE 1/2 TO 1 TABLET BY MOUTH EVERY MORNING FOR DIETING AND WEIGHT LOSS 30 tablet 2  . polyethylene glycol (MIRALAX / GLYCOLAX) packet Take 17 g by mouth as needed.     . ranitidine (ZANTAC) 300 MG tablet TAKE 1 TABLET (300 MG TOTAL) BY MOUTH 2 (TWO) TIMES DAILY. 180 tablet 3  . fenofibrate (TRICOR) 145 MG tablet Take 1 tablet (145 mg total) by mouth daily. 90  tablet 4   No current facility-administered medications on file prior to visit.     Review of Systems  Constitutional: Positive for malaise/fatigue. Negative for chills, diaphoresis and fever.  HENT: Negative for congestion, ear pain, hearing loss and sore throat.   Respiratory: Negative for cough, shortness of breath and wheezing.   Cardiovascular: Negative for chest pain, palpitations and leg swelling.  Gastrointestinal: Negative for abdominal pain, blood in stool, constipation, diarrhea, heartburn and melena.  Genitourinary: Negative for dysuria, flank pain, frequency, hematuria and urgency.  Musculoskeletal: Positive for falls. Negative for back pain, joint pain, myalgias and neck pain.  Skin: Negative.   Neurological: Negative for dizziness, tremors, seizures, loss of consciousness and weakness.  Psychiatric/Behavioral: Positive for depression. The patient is not nervous/anxious and  does not have insomnia.       Physical Exam: Filed Weights   09/02/16 1444  Weight: 180 lb (81.6 kg)   BP (!) 144/82   Pulse (!) 106   Temp 98.2 F (36.8 C) (Temporal)   Resp 18   Ht '5\' 3"'$  (1.6 m)   Wt 180 lb (81.6 kg)   BMI 31.89 kg/m    Wt Readings from Last 3 Encounters:  09/02/16 180 lb (81.6 kg)  08/23/16 185 lb 6.5 oz (84.1 kg)  08/14/16 183 lb (83 kg)    General Appearance: Well nourished, in no apparent distress. Eyes: PERRLA, EOMs, conjunctiva no swelling or erythema Sinuses: No Frontal/maxillary tenderness ENT/Mouth: Ext aud canals clear, TMs without erythema, bulging. No erythema, swelling, or exudate on post pharynx.  Tonsils not swollen or erythematous. Hearing normal.  Neck: Supple, thyroid normal.  Respiratory: Respiratory effort normal, BS equal bilaterally without rales, rhonchi, wheezing or stridor.  Cardio: RRR with no MRGs. Brisk peripheral pulses without edema.  Abdomen: Soft, + BS.  Non tender, no guarding, rebound, hernias, masses. Lymphatics: Non tender without  lymphadenopathy.  Musculoskeletal: Full ROM, 4/5 strength although not sure whether this is secondary to poor effort or whether patient has bilateral leg weakness, gait shuffling with the assistance of cane Skin: Warm, dry without rashes, lesions, ecchymosis.  Neuro: Cranial nerves intact. Normal muscle tone, no cerebellar symptoms. Sensation intact.  Psych: Awake and oriented X 3, normal affect, Poor Insight and Judgment    [name] Skeans, PA-C 3:22 PM [facility]

## 2016-09-03 LAB — BASIC METABOLIC PANEL WITH GFR
BUN: 19 mg/dL (ref 7–25)
CO2: 26 mmol/L (ref 20–31)
Calcium: 9.2 mg/dL (ref 8.6–10.4)
Chloride: 95 mmol/L — ABNORMAL LOW (ref 98–110)
Creat: 0.99 mg/dL (ref 0.50–0.99)
GFR, Est African American: 67 mL/min (ref >=60)
GFR, Est Non African American: 58 mL/min — ABNORMAL LOW (ref >=60)
Glucose, Bld: 342 mg/dL — ABNORMAL HIGH (ref 65–99)
Potassium: 4.3 mmol/L (ref 3.5–5.3)
Sodium: 133 mmol/L — ABNORMAL LOW (ref 135–146)

## 2016-09-03 LAB — HEPATIC FUNCTION PANEL
ALT: 16 U/L (ref 6–29)
AST: 12 U/L (ref 10–35)
Albumin: 3.5 g/dL — ABNORMAL LOW (ref 3.6–5.1)
Alkaline Phosphatase: 71 U/L (ref 33–130)
Bilirubin, Direct: 0.1 mg/dL (ref <=0.2)
Indirect Bilirubin: 0.3 mg/dL (ref 0.2–1.2)
Total Bilirubin: 0.4 mg/dL (ref 0.2–1.2)
Total Protein: 6 g/dL — ABNORMAL LOW (ref 6.1–8.1)

## 2016-09-08 ENCOUNTER — Telehealth: Payer: Self-pay | Admitting: *Deleted

## 2016-09-08 NOTE — Telephone Encounter (Signed)
Patient called and states she found an old bottle of Fenofibrate and asked is she should start taking it again.  Per Dr Melford Aase, she can stay off the medication until her next appointment.  Patient is aware.

## 2016-09-09 ENCOUNTER — Encounter: Payer: Self-pay | Admitting: *Deleted

## 2016-09-19 ENCOUNTER — Other Ambulatory Visit: Payer: Self-pay | Admitting: Internal Medicine

## 2016-09-22 ENCOUNTER — Encounter: Payer: Self-pay | Admitting: Internal Medicine

## 2016-09-26 ENCOUNTER — Ambulatory Visit: Payer: PPO | Admitting: Internal Medicine

## 2016-10-03 ENCOUNTER — Other Ambulatory Visit: Payer: Self-pay | Admitting: Internal Medicine

## 2016-10-03 DIAGNOSIS — B379 Candidiasis, unspecified: Secondary | ICD-10-CM

## 2016-10-15 ENCOUNTER — Other Ambulatory Visit: Payer: Self-pay | Admitting: Internal Medicine

## 2016-10-15 DIAGNOSIS — F319 Bipolar disorder, unspecified: Secondary | ICD-10-CM

## 2016-10-23 ENCOUNTER — Telehealth: Payer: Self-pay | Admitting: *Deleted

## 2016-10-23 NOTE — Telephone Encounter (Signed)
Patient called and asked when she should take her Atorvastatin, AM or PM.  Per Dr Melford Aase, either time is OK.  Patient also states she has been having  dental work done.  Dr Melford Aase is aware.

## 2016-11-08 ENCOUNTER — Other Ambulatory Visit: Payer: Self-pay | Admitting: Internal Medicine

## 2016-11-08 DIAGNOSIS — E039 Hypothyroidism, unspecified: Secondary | ICD-10-CM

## 2016-11-10 ENCOUNTER — Other Ambulatory Visit: Payer: Self-pay | Admitting: *Deleted

## 2016-11-10 MED ORDER — PHENTERMINE HCL 37.5 MG PO TABS: ORAL_TABLET | ORAL | 0 refills | Status: DC

## 2016-11-12 ENCOUNTER — Other Ambulatory Visit: Payer: Self-pay | Admitting: Internal Medicine

## 2016-11-17 ENCOUNTER — Encounter: Payer: Self-pay | Admitting: *Deleted

## 2016-11-28 ENCOUNTER — Other Ambulatory Visit: Payer: Self-pay | Admitting: Internal Medicine

## 2016-11-28 DIAGNOSIS — B379 Candidiasis, unspecified: Secondary | ICD-10-CM

## 2016-12-12 ENCOUNTER — Other Ambulatory Visit: Payer: Self-pay | Admitting: Internal Medicine

## 2016-12-25 ENCOUNTER — Other Ambulatory Visit: Payer: Self-pay | Admitting: Internal Medicine

## 2016-12-26 NOTE — Telephone Encounter (Signed)
Rx called into pharmacy @ 8:30am 9n 11th May 2018 By DD.

## 2017-01-13 ENCOUNTER — Other Ambulatory Visit: Payer: Self-pay | Admitting: Internal Medicine

## 2017-01-13 DIAGNOSIS — I10 Essential (primary) hypertension: Secondary | ICD-10-CM

## 2017-01-15 ENCOUNTER — Encounter: Payer: Self-pay | Admitting: Internal Medicine

## 2017-01-15 ENCOUNTER — Ambulatory Visit (INDEPENDENT_AMBULATORY_CARE_PROVIDER_SITE_OTHER): Payer: PPO | Admitting: Internal Medicine

## 2017-01-15 VITALS — BP 130/84 | HR 84 | Temp 97.0°F | Resp 16 | Ht 63.5 in | Wt 185.4 lb

## 2017-01-15 DIAGNOSIS — E782 Mixed hyperlipidemia: Secondary | ICD-10-CM | POA: Diagnosis not present

## 2017-01-15 DIAGNOSIS — E1122 Type 2 diabetes mellitus with diabetic chronic kidney disease: Secondary | ICD-10-CM

## 2017-01-15 DIAGNOSIS — N183 Chronic kidney disease, stage 3 unspecified: Secondary | ICD-10-CM

## 2017-01-15 DIAGNOSIS — I10 Essential (primary) hypertension: Secondary | ICD-10-CM

## 2017-01-15 DIAGNOSIS — Z0000 Encounter for general adult medical examination without abnormal findings: Secondary | ICD-10-CM

## 2017-01-15 DIAGNOSIS — R632 Polyphagia: Secondary | ICD-10-CM

## 2017-01-15 DIAGNOSIS — Z136 Encounter for screening for cardiovascular disorders: Secondary | ICD-10-CM | POA: Diagnosis not present

## 2017-01-15 DIAGNOSIS — Z79899 Other long term (current) drug therapy: Secondary | ICD-10-CM

## 2017-01-15 DIAGNOSIS — M1000 Idiopathic gout, unspecified site: Secondary | ICD-10-CM

## 2017-01-15 DIAGNOSIS — Z794 Long term (current) use of insulin: Secondary | ICD-10-CM

## 2017-01-15 DIAGNOSIS — Z1212 Encounter for screening for malignant neoplasm of rectum: Secondary | ICD-10-CM

## 2017-01-15 DIAGNOSIS — E119 Type 2 diabetes mellitus without complications: Secondary | ICD-10-CM

## 2017-01-15 DIAGNOSIS — Z0001 Encounter for general adult medical examination with abnormal findings: Secondary | ICD-10-CM

## 2017-01-15 DIAGNOSIS — E559 Vitamin D deficiency, unspecified: Secondary | ICD-10-CM
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
## 2017-01-15 NOTE — Patient Instructions (Signed)

## 2017-01-16 ENCOUNTER — Other Ambulatory Visit: Payer: Self-pay | Admitting: Internal Medicine

## 2017-01-16 DIAGNOSIS — E1122 Type 2 diabetes mellitus with diabetic chronic kidney disease: Secondary | ICD-10-CM | POA: Diagnosis not present

## 2017-01-16 DIAGNOSIS — E559 Vitamin D deficiency, unspecified: Secondary | ICD-10-CM | POA: Diagnosis not present

## 2017-01-16 DIAGNOSIS — M1000 Idiopathic gout, unspecified site: Secondary | ICD-10-CM | POA: Diagnosis not present

## 2017-01-16 DIAGNOSIS — Z794 Long term (current) use of insulin: Secondary | ICD-10-CM | POA: Diagnosis not present

## 2017-01-16 DIAGNOSIS — I10 Essential (primary) hypertension: Secondary | ICD-10-CM | POA: Diagnosis not present

## 2017-01-16 DIAGNOSIS — Z79899 Other long term (current) drug therapy: Secondary | ICD-10-CM | POA: Diagnosis not present

## 2017-01-16 DIAGNOSIS — N183 Chronic kidney disease, stage 3 (moderate): Secondary | ICD-10-CM | POA: Diagnosis not present

## 2017-01-16 DIAGNOSIS — E782 Mixed hyperlipidemia: Secondary | ICD-10-CM | POA: Diagnosis not present

## 2017-01-16 LAB — LIPID PANEL
Cholesterol: 142 mg/dL (ref <200)
HDL: 42 mg/dL — ABNORMAL LOW (ref >50)
LDL Cholesterol: 64 mg/dL (ref <100)
Total CHOL/HDL Ratio: 3.4 Ratio (ref <5.0)
Triglycerides: 182 mg/dL — ABNORMAL HIGH (ref <150)
VLDL: 36 mg/dL — ABNORMAL HIGH (ref <30)

## 2017-01-16 LAB — URINALYSIS, ROUTINE W REFLEX MICROSCOPIC

## 2017-01-16 LAB — CBC WITH DIFFERENTIAL/PLATELET
Basophils Absolute: 0 cells/uL (ref 0–200)
Basophils Relative: 0 %
Eosinophils Absolute: 188 cells/uL (ref 15–500)
Eosinophils Relative: 2 %
HCT: 36.2 % (ref 35.0–45.0)
Hemoglobin: 12.3 g/dL (ref 11.7–15.5)
Lymphocytes Relative: 18 %
Lymphs Abs: 1692 cells/uL (ref 850–3900)
MCH: 30.1 pg (ref 27.0–33.0)
MCHC: 34 g/dL (ref 32.0–36.0)
MCV: 88.5 fL (ref 80.0–100.0)
MPV: 8.5 fL (ref 7.5–12.5)
Monocytes Absolute: 564 cells/uL (ref 200–950)
Monocytes Relative: 6 %
Neutro Abs: 6956 cells/uL (ref 1500–7800)
Neutrophils Relative %: 74 %
Platelets: 290 10*3/uL (ref 140–400)
RBC: 4.09 MIL/uL (ref 3.80–5.10)
RDW: 14.5 % (ref 11.0–15.0)
WBC: 9.4 10*3/uL (ref 3.8–10.8)

## 2017-01-16 LAB — HEPATIC FUNCTION PANEL
ALT: 17 U/L (ref 6–29)
AST: 15 U/L (ref 10–35)
Albumin: 3.4 g/dL — ABNORMAL LOW (ref 3.6–5.1)
Alkaline Phosphatase: 65 U/L (ref 33–130)
Bilirubin, Direct: 0.1 mg/dL (ref <=0.2)
Indirect Bilirubin: 0.4 mg/dL (ref 0.2–1.2)
Total Bilirubin: 0.5 mg/dL (ref 0.2–1.2)
Total Protein: 5.9 g/dL — ABNORMAL LOW (ref 6.1–8.1)

## 2017-01-16 LAB — BASIC METABOLIC PANEL WITH GFR
BUN: 17 mg/dL (ref 7–25)
CO2: 23 mmol/L (ref 20–31)
Calcium: 9.1 mg/dL (ref 8.6–10.4)
Chloride: 88 mmol/L — ABNORMAL LOW (ref 98–110)
Creat: 1.23 mg/dL — ABNORMAL HIGH (ref 0.50–0.99)
GFR, Est African American: 52 mL/min — ABNORMAL LOW (ref >=60)
GFR, Est Non African American: 45 mL/min — ABNORMAL LOW (ref >=60)
Glucose, Bld: 123 mg/dL — ABNORMAL HIGH (ref 65–99)
Potassium: 4 mmol/L (ref 3.5–5.3)
Sodium: 124 mmol/L — ABNORMAL LOW (ref 135–146)

## 2017-01-16 LAB — MICROALBUMIN / CREATININE URINE RATIO

## 2017-01-16 LAB — VITAMIN D 25 HYDROXY (VIT D DEFICIENCY, FRACTURES): Vit D, 25-Hydroxy: 25 ng/mL — ABNORMAL LOW (ref 30–100)

## 2017-01-16 LAB — HEMOGLOBIN A1C
Hgb A1c MFr Bld: 8.3 % — ABNORMAL HIGH (ref <5.7)
Mean Plasma Glucose: 192 mg/dL

## 2017-01-16 LAB — MAGNESIUM: Magnesium: 1.1 mg/dL — ABNORMAL LOW (ref 1.5–2.5)

## 2017-01-16 LAB — TSH: TSH: 1.9 mIU/L

## 2017-01-16 LAB — URIC ACID: Uric Acid, Serum: 4.3 mg/dL (ref 2.5–7.0)

## 2017-01-17 LAB — URINALYSIS, ROUTINE W REFLEX MICROSCOPIC
Bilirubin Urine: NEGATIVE
Glucose, UA: NEGATIVE
Ketones, ur: NEGATIVE
Nitrite: NEGATIVE
Specific Gravity, Urine: 1.007 (ref 1.001–1.035)
pH: 6 (ref 5.0–8.0)

## 2017-01-17 LAB — MICROALBUMIN / CREATININE URINE RATIO
Creatinine, Urine: 30 mg/dL (ref 20–320)
Microalb Creat Ratio: 3300 mcg/mg creat — ABNORMAL HIGH (ref <30)
Microalb, Ur: 99 mg/dL

## 2017-01-17 LAB — URINALYSIS, MICROSCOPIC ONLY
Casts: NONE SEEN [LPF]
Crystals: NONE SEEN [HPF]
RBC / HPF: NONE SEEN RBC/HPF (ref <=2)
Yeast: NONE SEEN [HPF]

## 2017-01-21 ENCOUNTER — Other Ambulatory Visit: Payer: Self-pay | Admitting: Physician Assistant

## 2017-01-21 ENCOUNTER — Other Ambulatory Visit: Payer: Self-pay | Admitting: Internal Medicine

## 2017-01-21 NOTE — Telephone Encounter (Signed)
Please call Phentermine

## 2017-02-12 ENCOUNTER — Ambulatory Visit: Payer: PPO | Admitting: Adult Health

## 2017-02-19 ENCOUNTER — Other Ambulatory Visit: Payer: Self-pay

## 2017-02-19 DIAGNOSIS — Z1212 Encounter for screening for malignant neoplasm of rectum: Secondary | ICD-10-CM

## 2017-02-19 LAB — POC HEMOCCULT BLD/STL (HOME/3-CARD/SCREEN)
Card #2 Fecal Occult Blod, POC: NEGATIVE
Card #3 Fecal Occult Blood, POC: NEGATIVE
Fecal Occult Blood, POC: NEGATIVE

## 2017-02-27 ENCOUNTER — Other Ambulatory Visit: Payer: Self-pay | Admitting: Internal Medicine

## 2017-02-27 DIAGNOSIS — E1140 Type 2 diabetes mellitus with diabetic neuropathy, unspecified: Secondary | ICD-10-CM

## 2017-03-05 ENCOUNTER — Other Ambulatory Visit: Payer: Self-pay | Admitting: Internal Medicine

## 2017-03-05 DIAGNOSIS — E1140 Type 2 diabetes mellitus with diabetic neuropathy, unspecified: Secondary | ICD-10-CM

## 2017-04-02 ENCOUNTER — Other Ambulatory Visit: Payer: Self-pay | Admitting: Internal Medicine

## 2017-04-07 DIAGNOSIS — E119 Type 2 diabetes mellitus without complications: Secondary | ICD-10-CM | POA: Diagnosis not present

## 2017-04-07 DIAGNOSIS — E113393 Type 2 diabetes mellitus with moderate nonproliferative diabetic retinopathy without macular edema, bilateral: Secondary | ICD-10-CM | POA: Diagnosis not present

## 2017-04-07 DIAGNOSIS — H35033 Hypertensive retinopathy, bilateral: Secondary | ICD-10-CM | POA: Diagnosis not present

## 2017-04-07 DIAGNOSIS — H3561 Retinal hemorrhage, right eye: Secondary | ICD-10-CM | POA: Diagnosis not present

## 2017-04-07 LAB — HM DIABETES EYE EXAM

## 2017-04-16 ENCOUNTER — Other Ambulatory Visit: Payer: Self-pay | Admitting: Internal Medicine

## 2017-04-16 DIAGNOSIS — B379 Candidiasis, unspecified: Secondary | ICD-10-CM

## 2017-04-19 ENCOUNTER — Other Ambulatory Visit: Payer: Self-pay | Admitting: Internal Medicine

## 2017-04-19 DIAGNOSIS — E039 Hypothyroidism, unspecified: Secondary | ICD-10-CM

## 2017-04-28 ENCOUNTER — Encounter: Payer: Self-pay | Admitting: Internal Medicine

## 2017-05-02 ENCOUNTER — Other Ambulatory Visit: Payer: Self-pay | Admitting: Internal Medicine

## 2017-05-04 NOTE — Telephone Encounter (Signed)
OK to refill but only if she schedules another appt within next 2 mo.

## 2017-05-04 NOTE — Telephone Encounter (Signed)
Submitted a note into pharmacy.

## 2017-05-04 NOTE — Telephone Encounter (Signed)
Last seen 06/24/16; no other appointments. Okay to refill? Thanks!

## 2017-05-07 ENCOUNTER — Ambulatory Visit: Payer: Self-pay | Admitting: Physician Assistant

## 2017-05-19 ENCOUNTER — Other Ambulatory Visit: Payer: Self-pay | Admitting: Internal Medicine

## 2017-05-19 DIAGNOSIS — B379 Candidiasis, unspecified: Secondary | ICD-10-CM

## 2017-05-25 ENCOUNTER — Other Ambulatory Visit: Payer: Self-pay | Admitting: *Deleted

## 2017-05-25 DIAGNOSIS — B379 Candidiasis, unspecified: Secondary | ICD-10-CM

## 2017-05-25 MED ORDER — FENOFIBRATE 145 MG PO TABS: 145.0000 mg | ORAL_TABLET | Freq: Every day | ORAL | 1 refills | Status: DC

## 2017-06-08 ENCOUNTER — Encounter: Payer: Self-pay | Admitting: Physician Assistant

## 2017-06-08 ENCOUNTER — Other Ambulatory Visit: Payer: Self-pay | Admitting: Internal Medicine

## 2017-06-08 DIAGNOSIS — E1169 Type 2 diabetes mellitus with other specified complication: Secondary | ICD-10-CM | POA: Insufficient documentation

## 2017-06-08 DIAGNOSIS — E785 Hyperlipidemia, unspecified: Secondary | ICD-10-CM

## 2017-06-08 NOTE — Progress Notes (Signed)
MEDICARE ANNUAL WELLNESS VISIT AND FOLLOW UP  Assessment:   Insulin-requiring or dependent type II diabetes mellitus ([place]) Discussed general issues about diabetes pathophysiology and management., Educational material distributed., Suggested low cholesterol diet., Encouraged aerobic exercise., Discussed foot care., Reminded to get yearly retinal exam. 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  WILL WORK ON COMPLIANCE BUT PATIENT IS VERY NONCOMPLIANT -     Hemoglobin A1c  Poorly controlled type 2 diabetes mellitus with circulatory disorder ([place]) Discussed general issues about diabetes pathophysiology and management., Educational material distributed., Suggested low cholesterol diet., Encouraged aerobic exercise., Discussed foot care., Reminded to get yearly retinal exam. -     Hemoglobin A1c  Essential hypertension - continue medications, DASH diet, exercise and monitor at home. Call if greater than 130/80.  -     CBC with Differential/Platelet -     BASIC METABOLIC PANEL WITH GFR -     Hepatic function panel -     TSH  COPD Advised to stop smoking, will get CXR, continue meds.   Diabetic CKD 3  (GFR 44 ml/min) (HCC) Increase fluids, avoid NSAIDS, monitor sugars, will monitor -     BASIC METABOLIC PANEL WITH GFR -     Hemoglobin A1c  Diabetic Peripheral Neuropathy CONTINUE CANE USE, DECLINES PT AT THIS TIME -     Hemoglobin A1c  Senile purpura (HCC) monitor  Mild nonproliferative diabetic retinopathy of both eyes without macular edema associated with type 2 diabetes mellitus ([place]) Continue eye doctor appointments  Type 2 diabetes mellitus with hyperlipidemia ([place]) -continue medications, check  lipids, decrease fatty foods, increase activity. On statin -     Lipid panel  Severe episode of recurrent major depressive disorder, without psychotic features ([place]) Depression - continue medications, stress management techniques discussed, increase water, good sleep hygiene discussed, increase exercise, and increase veggies.   Bipolar depression ([place]) Continue follow up psych  Other vitamin B12 deficiency anemia - B12 is low end of normal, add sublingual B12.   Hypothyroidism, unspecified type Hypothyroidism-check TSH level, continue medications the same, reminded to take on an empty stomach 30-72mins before food.  -     TSH  Diarrhea, unspecified type Will check labs, suggest follow up GI -     Gastrointestinal Pathogen Panel PCR; Future -     Fecal Fat, Qualitative; Future -     Ambulatory referral to Gastroenterology  Tobacco use Smoking cessation-  instruction/counseling given, counseled patient on the dangers of tobacco use, advised patient to stop smoking, and reviewed strategies to maximize success, patient not ready to quit at this time.   Medication management -     Magnesium  Right leg pain and Right leg swelling -     VAS [place] LOWER EXTREMITY ARTERIAL DUPLEX; Future -     VAS [place] LOWER EXTREMITY VENOUS (DVT); Future - elevate legs, decreased pulses with smoking and DM, no claudication but patient is very inactive due to imbalance  Left ear pain and Left ear impacted cerumen - stop using Qtips, irrigation used in the office without complications, use OTC drops/oil at home to prevent reoccurence  Over 30 minutes of exam, counseling, chart review, and critical decision making was performed  Plan:   During the course of the visit the patient was educated  and counseled about appropriate screening and preventive services including:    Pneumococcal vaccine   Influenza vaccine  Td vaccine  Prevnar 13  Screening electrocardiogram  Screening mammography  Bone  densitometry screening  Colorectal cancer screening  Diabetes screening  Glaucoma screening  Nutrition counseling   Advanced directives: given info/requested copies   Subjective:   Ashley [name] is a 71 y.o. female who presents for Medicare Annual Wellness Visit and 3 month follow up on hypertension, prediabete, hyperlipidemia, vitamin D def.   Her blood pressure has been controlled at home, today their BP is BP: 130/80 She does workout. She denies chest pain, shortness of breath, dizziness.  She is on cholesterol medication, lipitor 40 mg daily and denies myalgias. Her cholesterol is at goal. The cholesterol last visit was:   Lab Results  Component Value Date   CHOL 142 01/15/2017   HDL 42 (L) 01/15/2017   LDLCALC 64 01/15/2017   TRIG 182 (H) 01/15/2017   CHOLHDL 3.4 01/15/2017   She has not been working on diet and exercise for diabetes with CKD, on insulin her A1C is not at goal less than 8, she is on novolin NPH 50 in the AM and 25 at night, lowest is 60 and she does have awareness, husband gives her the insulin, and denies hypoglycemia , increased appetite, nausea, polydipsia, polyuria, visual disturbances, vomiting and weight loss. Last A1C in the office was:  Lab Results  Component Value Date   HGBA1C 8.3 (H) 01/15/2017   Last GFR Lab Results  Component Value Date   GFRNONAA 45 (L) 01/15/2017   Patient is on Vitamin D supplement. Lab Results  Component Value Date   VD25OH 25 (L) 01/15/2017     She is on thyroid medication. Her medication was not changed last visit.   Lab Results  Component Value Date   TSH 1.90 01/15/2017  .  BMI is Body mass index is 30.13 kg/m., she is working on diet and exercise. Weight is down 13 lbs but she states she has had diarrhea x 6 months. Having incontinence fecal, 3-5 x times a day, small watery, some abdominal pain/cramping with it. No blood or black stool in it.   Wt Readings from Last 3 Encounters:  06/09/17 172 lb 12.8 oz  (78.4 kg)  01/15/17 185 lb 6.4 oz (84.1 kg)  09/02/16 180 lb (81.6 kg)   She has dizziness with walking/moving, has falls, Ct head 2015, MRA brain 2017, she is being weaned off Klonopin for Dr. Buddy Duty. Has seen Dr. Jannifer Franklin 08/14/2016. She is not driving anymore, walks with cane. Declines PT.  Patient is on allopurinol for gout and does not report a recent flare.  Lab Results  Component Value Date   LABURIC 4.3 01/15/2017    Medication Review Current Outpatient Prescriptions on File Prior to Visit  Medication Sig Dispense Refill  . allopurinol (ZYLOPRIM) 300 MG tablet TAKE 1 TABLET BY MOUTH EVERY MORNING. 90 tablet 1  . aspirin EC 81 MG tablet Take 81 mg by mouth daily.    Marland Kitchen atorvastatin (LIPITOR) 40 MG tablet TAKE 1 TABLET BY MOUTH EVERY DAY 90 tablet 1  . clonazePAM (KLONOPIN) 1 MG tablet Take 1 mg by mouth 3 (three) times daily as needed for anxiety.    . fenofibrate (TRICOR) 145 MG tablet Take 1 tablet (145 mg total) by mouth daily. 90 tablet 1  . fluconazole (DIFLUCAN) 150 MG tablet Take 1 tablet weekly as needed for Yeast infection 12 tablet  0  . FLUoxetine (PROZAC) 40 MG capsule TAKE ONE CAPSULE BY MOUTH EVERY DAY 90 capsule 3  . gabapentin (NEURONTIN) 300 MG capsule TAKE ONE CAPSULE BY MOUTH IN THE EVENING 90 capsule 1  . glipiZIDE (GLUCOTROL XL) 5 MG 24 hr tablet TAKE 1 TABLET BY MOUTH EVERY DAY BEFORE BREAKFAST 60 tablet 1  . glucose blood (ONE TOUCH ULTRA TEST) test strip TEST BLOOD SUGAR 3 TIMES A DAY (DX E11.9) 100 each 1  . hydrochlorothiazide (HYDRODIURIL) 25 MG tablet TAKE 1 TABLET BY MOUTH EVERY OTHER DAY 90 tablet 1  . insulin NPH Human (NOVOLIN N) 100 UNIT/ML injection INJECT 45 UNITS EVERY MORNING AND 15 UNITS EVERY EVENING 30 mL 2  . levothyroxine (SYNTHROID, LEVOTHROID) 100 MCG tablet TAKE 1 TABLET BY MOUTH EVERY DAY 90 tablet 1  . losartan (COZAAR) 100 MG tablet TAKE 1 TABLET BY MOUTH EVERY DAY FOR BLOOD PRESSURE AND KIDNEY PROTECTION 90 tablet 1  . metFORMIN  (GLUCOPHAGE) 1000 MG tablet TAKE 1 TABLET BY MOUTH TWICE A DAY WITH A MEAL 180 tablet 1  . OVER THE COUNTER MEDICATION Hair,skin and nails vitamin 1 tablet daily.    . phentermine (ADIPEX-P) 37.5 MG tablet TAKE 1 TABLET BY MOUTH EVERY DAY 30 tablet 2  . ranitidine (ZANTAC) 300 MG tablet TAKE 1 TABLET (300 MG TOTAL) BY MOUTH 2 (TWO) TIMES DAILY. 180 tablet 3   No current facility-administered medications on file prior to visit.     Current Problems (verified) Patient Active Problem List   Diagnosis Date Noted  . Mild nonproliferative diabetic retinopathy of both eyes without macular edema associated with type 2 diabetes mellitus ([place]) 06/09/2017  . Senile purpura ([place]) 06/09/2017  . Type 2 diabetes mellitus with hyperlipidemia ([place]) 06/08/2017  . Memory difficulty 08/14/2016  . Frequent falls 04/08/2016  . Hypothyroidism 03/05/2016  . GERD (gastroesophageal reflux disease) 03/05/2016  . Insulin-requiring or dependent type II diabetes mellitus ([place]) 03/05/2016  . 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 Diabetic CKD 3  (GFR 44 ml/min) (HCC) 03/05/2016  . 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 Poorly controlled type 2 diabetes mellitus with circulatory disorder ([place]) 01/16/2016  . Gout 08/29/2015  . Non compliance w medication regimen 02/20/2015  . Bipolar depression ([place]) 11/06/2014  . Vitamin D deficiency 12/01/2013  . Medication management 12/01/2013  . Abnormality of gait 03/10/2013  . Other vitamin B12 deficiency anemia 10/25/2012  . Carpal tunnel syndrome 10/25/2012  . Diabetic Peripheral Neuropathy 10/25/2012  . Hyperlipidemia 10/12/2009  . Major depressive disorder, recurrent episode ([place]) 10/12/2009  . Essential hypertension 10/12/2009  . COPD 10/12/2009  . Colon Polyps 06/02/2008    Screening Tests Immunization History  Administered Date(s) Administered  . Pneumococcal Conjugate-13 07/26/2014  .  Pneumococcal-Unspecified 11/16/2012  . Td 08/18/2008  . Zoster 10/03/2008    Preventative care: Last colonoscopy: 2014 Dr. Ardis Hughs Last mammogram: 02/2016 DEXA:2013 PAP 2015 MRA brain 06/2016 MRI lumbar 2014 US neck 09/2014 Ct AB 2015  Prior vaccinations: TD or Tdap: 2010  Influenza: Declined  Pneumococcal: 2014 Prevnar13: 2015 Shingles/Zostavax: 2010  Names of Other Physician/Practitioners you currently use: 1. [facility]- here for primary care 2. Dr. Herbert Deaner, [place] eye doctor, last visit 04/07/2017 3. Dr. Johnnye Sima, dentist, last visit sept 2018 Patient Care Team: Unk Pinto, MD as PCP - General (Internal Medicine) Chucky May, MD as Consulting Physician (Psychiatry) [name], MD as Attending Physician (Obstetrics and Gynecology) Owens Loffler  P, MD as Attending Physician (Gastroenterology)  Allergies Allergies  Allergen Reactions  . Sulfa Antibiotics Other (See Comments)    Pt states she had "extreme pain"  . Sulfacetamide Sodium     SURGICAL HISTORY She  has a past surgical history that includes Bladder suspension (1996); NASAL AND FACIAL SURGERY (1985); Vaginal hysterectomy (1985); Vulvectomy partial ([place]); and Vulvar lesion removal (06/22/2012). FAMILY HISTORY Her family history includes Heart disease in her father; Heart failure in her father; Hyperlipidemia in her father and mother; Hypertension in her brother and father; Stroke in her father. SOCIAL HISTORY She  reports that she has been smoking Cigarettes.  She has a 54.00 pack-year smoking history. She has never used smokeless tobacco. She reports that she does not drink alcohol or use drugs.  MEDICARE WELLNESS OBJECTIVES: Physical activity: Current Exercise Habits: The patient does not participate in regular exercise at present, Exercise limited by: orthopedic condition(s);Other - see comments (imbalance) Cardiac risk factors: Cardiac Risk Factors  include: advanced age (>13men, >52 women);diabetes mellitus;dyslipidemia;hypertension;obesity (BMI >30kg/m2);sedentary lifestyle Depression/mood screen:   Depression screen [facility] 2/9 06/09/2017  Decreased Interest 0  Down, Depressed, Hopeless 0  PHQ - 2 Score 0  Altered sleeping -  Tired, decreased energy -  Change in appetite -  Feeling bad or failure about yourself  -  Trouble concentrating -  Moving slowly or fidgety/restless -  Suicidal thoughts -  PHQ-9 Score -  Difficult doing work/chores -    ADLs:  In your present state of health, do you have any difficulty performing the following activities: 06/09/2017  Hearing? N  Vision? N  Difficulty concentrating or making decisions? Y  Walking or climbing stairs? Y  Dressing or bathing? Y  Doing errands, shopping? Y  Preparing Food and eating ? N  Using the Toilet? Y  In the past six months, have you accidently leaked urine? Y  Do you have problems with loss of bowel control? Y  Managing your Medications? Y  Managing your Finances? Y  Housekeeping or managing your Housekeeping? Y  Some recent data might be hidden     Cognitive Testing  Alert? Yes  Normal Appearance?Yes  Oriented to person? Yes  Place? Yes   Time? Yes  Recall of three objects?  No 1/3  Can perform simple calculations? Yes  Displays appropriate judgment?Yes  Can read the correct time from a watch face?Yes  EOL planning: Does Patient Have a Medical Advance Directive?: Yes Type of Advance Directive: Healthcare Power of Attorney, Living will Copy of [place] in Chart?: No - copy requested   Objective:   Today's Vitals   06/09/17 1534  BP: 130/80  Pulse: 77  Resp: 16  Temp: (!) 97.5 F (36.4 C)  SpO2: 96%  Weight: 172 lb 12.8 oz (78.4 kg)  Height: 5' 3.5" (1.613 m)  PainSc: 7   PainLoc: Foot   Body mass index is 30.13 kg/m.  General appearance: alert, no distress, WD/WN,  female HEENT: normocephalic, sclerae anicteric, TMs  pearly, nares patent, no discharge or erythema, pharynx normal Oral cavity: MMM, no lesions Neck: supple, no lymphadenopathy, no thyromegaly, no masses Heart: RRR, normal S1, S2, no murmurs Lungs: CTA bilaterally, no wheezes, rhonchi, or rales Abdomen: +bs, soft, non tender, non distended, no masses, no hepatomegaly, no splenomegaly Musculoskeletal: nontender, no swelling, no obvious deformity Extremities: 2+ edema right leg only, no cyanosis, no clubbing Pulses: 2+ symmetric, upper extremities, decreased TP/DP bilateral lower extremities normal cap refill Neurological: alert,  oriented x 3, CN2-12 intact, strength decreased upper extremities and lower extremities, sensation decreased bilateral legs, DTRs 2+ throughout, no cerebellar signs other than + romberg, gait slow, unsteady with cane Psychiatric: Histrionic on exam with flight of ideas and difficulty maintaining subjects of conversation. Breast: defer Gyn: defer Rectal: defer   Medicare Attestation I have personally reviewed: The patient's medical and social history Their use of alcohol, tobacco or illicit drugs Their current medications and supplements The patient's functional ability including ADLs,fall risks, home safety risks, cognitive, and hearing and visual impairment Diet and physical activities Evidence for depression or mood disorders  The patient's weight, height, BMI, and visual acuity have been recorded in the chart.  I have made referrals, counseling, and provided education to the patient based on review of the above and I have provided the patient with a written personalized care plan for preventive services.     Vicie Mutters, PA-C   06/09/2017

## 2017-06-08 NOTE — Telephone Encounter (Signed)
Please call phentermine

## 2017-06-09 ENCOUNTER — Encounter: Payer: Self-pay | Admitting: Physician Assistant

## 2017-06-09 ENCOUNTER — Ambulatory Visit (INDEPENDENT_AMBULATORY_CARE_PROVIDER_SITE_OTHER): Payer: PPO | Admitting: Physician Assistant

## 2017-06-09 VITALS — BP 130/80 | HR 77 | Temp 97.5°F | Resp 16 | Ht 63.5 in | Wt 172.8 lb

## 2017-06-09 DIAGNOSIS — G63 Polyneuropathy in diseases classified elsewhere: Secondary | ICD-10-CM | POA: Diagnosis not present

## 2017-06-09 DIAGNOSIS — H9202 Otalgia, left ear: Secondary | ICD-10-CM

## 2017-06-09 DIAGNOSIS — M79604 Pain in right leg: Secondary | ICD-10-CM

## 2017-06-09 DIAGNOSIS — D692 Other nonthrombocytopenic purpura: Secondary | ICD-10-CM

## 2017-06-09 DIAGNOSIS — I10 Essential (primary) hypertension: Secondary | ICD-10-CM

## 2017-06-09 DIAGNOSIS — E1159 Type 2 diabetes mellitus with other circulatory complications: Secondary | ICD-10-CM | POA: Diagnosis not present

## 2017-06-09 DIAGNOSIS — E1169 Type 2 diabetes mellitus with other specified complication: Secondary | ICD-10-CM | POA: Diagnosis not present

## 2017-06-09 DIAGNOSIS — N183 Chronic kidney disease, stage 3 unspecified: Secondary | ICD-10-CM

## 2017-06-09 DIAGNOSIS — J438 Other emphysema: Secondary | ICD-10-CM | POA: Diagnosis not present

## 2017-06-09 DIAGNOSIS — F332 Major depressive disorder, recurrent severe without psychotic features: Secondary | ICD-10-CM | POA: Diagnosis not present

## 2017-06-09 DIAGNOSIS — E119 Type 2 diabetes mellitus without complications: Secondary | ICD-10-CM | POA: Diagnosis not present

## 2017-06-09 DIAGNOSIS — E785 Hyperlipidemia, unspecified: Secondary | ICD-10-CM

## 2017-06-09 DIAGNOSIS — E1122 Type 2 diabetes mellitus with diabetic chronic kidney disease: Secondary | ICD-10-CM | POA: Diagnosis not present

## 2017-06-09 DIAGNOSIS — R6889 Other general symptoms and signs: Secondary | ICD-10-CM

## 2017-06-09 DIAGNOSIS — R197 Diarrhea, unspecified: Secondary | ICD-10-CM

## 2017-06-09 DIAGNOSIS — F319 Bipolar disorder, unspecified: Secondary | ICD-10-CM

## 2017-06-09 DIAGNOSIS — E1129 Type 2 diabetes mellitus with other diabetic kidney complication: Secondary | ICD-10-CM | POA: Diagnosis not present

## 2017-06-09 DIAGNOSIS — M7989 Other specified soft tissue disorders: Secondary | ICD-10-CM

## 2017-06-09 DIAGNOSIS — Z794 Long term (current) use of insulin: Principal | ICD-10-CM

## 2017-06-09 DIAGNOSIS — D518 Other vitamin B12 deficiency anemias: Secondary | ICD-10-CM

## 2017-06-09 DIAGNOSIS — E039 Hypothyroidism, unspecified: Secondary | ICD-10-CM

## 2017-06-09 DIAGNOSIS — Z720 Tobacco use: Secondary | ICD-10-CM

## 2017-06-09 DIAGNOSIS — E113293 Type 2 diabetes mellitus with mild nonproliferative diabetic retinopathy without macular edema, bilateral: Secondary | ICD-10-CM

## 2017-06-09 DIAGNOSIS — E1165 Type 2 diabetes mellitus with hyperglycemia: Secondary | ICD-10-CM

## 2017-06-09 DIAGNOSIS — Z0001 Encounter for general adult medical examination with abnormal findings: Secondary | ICD-10-CM | POA: Diagnosis not present

## 2017-06-09 DIAGNOSIS — F3130 Bipolar disorder, current episode depressed, mild or moderate severity, unspecified: Secondary | ICD-10-CM

## 2017-06-09 DIAGNOSIS — Z79899 Other long term (current) drug therapy: Secondary | ICD-10-CM

## 2017-06-09 DIAGNOSIS — H6122 Impacted cerumen, left ear: Secondary | ICD-10-CM

## 2017-06-09 NOTE — Patient Instructions (Addendum)
Try to keep log of your sugars take 2 times a day In the morning and 2 hours after food at lunch or at dinner.   CAN REFER TO PHYSICAL THERAPY IF YOU WANT, CAN SEND OUT TO YOUR HOUSE please let us know  We are getting an [place] of your leg, go to Er if you have severe pain or shortness of breath in the mean time   Your A1C is a measure of your sugar over the past 3 months and is not affected by what you have eaten over the past few days. Diabetes increases your chances of stroke and heart attack over 300 % and is the leading cause of blindness and kidney failure in the [place]. Please make sure you decrease bad carbs like white bread, white rice, potatoes, corn, soft drinks, pasta, cereals, refined sugars, sweet tea, dried fruits, and fruit juice. Good carbs are okay to eat in moderation like sweet potatoes, brown rice, whole grain pasta/bread, most fruit (except dried fruit) and you can eat as many veggies as you want.   Greater than 6.5 is considered diabetic. Between 6.4 and 5.7 is prediabetic If your A1C is less than 5.7 you are NOT diabetic.  Targets for Glucose Readings: Time of Check Target for patients WITHOUT Diabetes Target for DIABETICS  Before Meals Less than 100  less than 150  Two hours after meals Less than 200  Less than 250    YOUR GOAL IS LESS THAN 8  Somogyi effect The brain needs two things: oxygen and sugar. If the blood sugar level drops too low in the early morning hours, hormones (such as growth hormone, cortisol, and catecholamines) are released to make sure you brain can still function. These help reverse the low blood sugar level but may lead to blood sugar levels that are higher than normal in the morning. This is common for patient that take insulin at night or do not ear regular snacks.  Please schedule to get up in the middle of the night to check your blood sugar.   This may be happening to you. Please eat a high protein night time snack such at low fat  cheese stick, greek yogurt and we will be decreasing your night time insulin as follows: 15 units at night  . If at any time you start to have low blood sugars in the morning or during the day please stop this medication. Please never take this medication if you are sick or can not eat. A low blood sugar is much more dangerous than a high blood sugar. Your brain needs two things, sugar and oxygen.    Vitamin D goal is between 60-80  Please make sure that you are taking your Vitamin D as directed.   It is very important as a natural anti-inflammatory   helping hair, skin, and nails, as well as reducing stroke and heart attack risk.   It helps your bones and helps with mood.  We want you on at least 5000 IU daily  It also decreases numerous cancer risks so please take it as directed.   Low Vit D is associated with a 200-300% higher risk for CANCER   and 200-300% higher risk for HEART   ATTACK  &  STROKE.    .....................................Marland Kitchen  It is also associated with higher death rate at younger ages,   autoimmune diseases like Rheumatoid arthritis, Lupus, Multiple Sclerosis.     Also many other serious conditions, like depression, Alzheimer's  Dementia, infertility, muscle aches, fatigue, fibromyalgia - just to name a few.  +++++++++++++++++++  Can get liquid vitamin D from [place] here in [place] at  [facility] [address], [place], [place] [address] Or you can try earth [employer]

## 2017-06-10 ENCOUNTER — Other Ambulatory Visit: Payer: Self-pay | Admitting: Internal Medicine

## 2017-06-10 LAB — CBC WITH DIFFERENTIAL/PLATELET
Basophils Absolute: 67 cells/uL (ref 0–200)
Basophils Relative: 0.7 %
Eosinophils Absolute: 154 cells/uL (ref 15–500)
Eosinophils Relative: 1.6 %
HCT: 37.7 % (ref 35.0–45.0)
Hemoglobin: 13 g/dL (ref 11.7–15.5)
Lymphs Abs: 1632 cells/uL (ref 850–3900)
MCH: 29.7 pg (ref 27.0–33.0)
MCHC: 34.5 g/dL (ref 32.0–36.0)
MCV: 86.1 fL (ref 80.0–100.0)
MPV: 9.5 fL (ref 7.5–12.5)
Monocytes Relative: 5.8 %
Neutro Abs: 7190 cells/uL (ref 1500–7800)
Neutrophils Relative %: 74.9 %
Platelets: 363 10*3/uL (ref 140–400)
RBC: 4.38 10*6/uL (ref 3.80–5.10)
RDW: 13.5 % (ref 11.0–15.0)
Total Lymphocyte: 17 %
WBC mixed population: 557 cells/uL (ref 200–950)
WBC: 9.6 10*3/uL (ref 3.8–10.8)

## 2017-06-10 LAB — MAGNESIUM: Magnesium: 1.3 mg/dL — ABNORMAL LOW (ref 1.5–2.5)

## 2017-06-10 LAB — HEMOGLOBIN A1C
Hgb A1c MFr Bld: 8 % of total Hgb — ABNORMAL HIGH (ref <5.7)
Mean Plasma Glucose: 183 (calc)
eAG (mmol/L): 10.1 (calc)

## 2017-06-10 LAB — LIPID PANEL
Cholesterol: 147 mg/dL (ref <200)
HDL: 51 mg/dL (ref >50)
LDL Cholesterol (Calc): 63 mg/dL (calc)
Non-HDL Cholesterol (Calc): 96 mg/dL (calc) (ref <130)
Total CHOL/HDL Ratio: 2.9 (calc) (ref <5.0)
Triglycerides: 279 mg/dL — ABNORMAL HIGH (ref <150)

## 2017-06-10 LAB — TSH: TSH: 2.68 mIU/L (ref 0.40–4.50)

## 2017-06-10 LAB — BASIC METABOLIC PANEL WITH GFR
BUN/Creatinine Ratio: 12 (calc) (ref 6–22)
BUN: 16 mg/dL (ref 7–25)
CO2: 28 mmol/L (ref 20–32)
Calcium: 10 mg/dL (ref 8.6–10.4)
Chloride: 91 mmol/L — ABNORMAL LOW (ref 98–110)
Creat: 1.31 mg/dL — ABNORMAL HIGH (ref 0.60–0.93)
GFR, Est African American: 48 mL/min/{1.73_m2} — ABNORMAL LOW (ref >=60)
GFR, Est Non African American: 41 mL/min/{1.73_m2} — ABNORMAL LOW (ref >=60)
Glucose, Bld: 194 mg/dL — ABNORMAL HIGH (ref 65–99)
Potassium: 3.9 mmol/L (ref 3.5–5.3)
Sodium: 130 mmol/L — ABNORMAL LOW (ref 135–146)

## 2017-06-10 LAB — HEPATIC FUNCTION PANEL
AG Ratio: 1.3 (calc) (ref 1.0–2.5)
ALT: 12 U/L (ref 6–29)
AST: 13 U/L (ref 10–35)
Albumin: 3.5 g/dL — ABNORMAL LOW (ref 3.6–5.1)
Alkaline phosphatase (APISO): 54 U/L (ref 33–130)
Bilirubin, Direct: 0.1 mg/dL (ref 0.0–0.2)
Globulin: 2.7 g/dL (calc) (ref 1.9–3.7)
Indirect Bilirubin: 0.2 mg/dL (calc) (ref 0.2–1.2)
Total Bilirubin: 0.3 mg/dL (ref 0.2–1.2)
Total Protein: 6.2 g/dL (ref 6.1–8.1)

## 2017-06-11 ENCOUNTER — Encounter (HOSPITAL_COMMUNITY): Payer: PPO

## 2017-06-11 NOTE — Progress Notes (Signed)
Pt aware of lab results & voiced understanding of those results.

## 2017-06-16 ENCOUNTER — Encounter (HOSPITAL_COMMUNITY): Payer: PPO

## 2017-06-17 ENCOUNTER — Ambulatory Visit (HOSPITAL_COMMUNITY)
Admission: RE | Admit: 2017-06-17 | Discharge: 2017-06-17 | Disposition: A | Discharge status: Home / Self Care | Payer: PPO | Source: Ambulatory Visit | Attending: Physician Assistant | Admitting: Physician Assistant

## 2017-06-17 DIAGNOSIS — M79604 Pain in right leg: Secondary | ICD-10-CM | POA: Insufficient documentation

## 2017-06-17 DIAGNOSIS — M7989 Other specified soft tissue disorders: Secondary | ICD-10-CM | POA: Insufficient documentation

## 2017-06-18 ENCOUNTER — Other Ambulatory Visit: Payer: Self-pay

## 2017-06-18 ENCOUNTER — Other Ambulatory Visit: Payer: Self-pay | Admitting: Internal Medicine

## 2017-06-18 ENCOUNTER — Other Ambulatory Visit: Payer: Self-pay | Admitting: Physician Assistant

## 2017-06-18 DIAGNOSIS — M79605 Pain in left leg: Secondary | ICD-10-CM

## 2017-06-18 DIAGNOSIS — M79604 Pain in right leg: Secondary | ICD-10-CM

## 2017-06-18 NOTE — Progress Notes (Signed)
Pt aware of lab results & voiced understanding of those results.

## 2017-06-23 ENCOUNTER — Ambulatory Visit: Payer: PPO | Admitting: Physician Assistant

## 2017-06-23 ENCOUNTER — Encounter: Payer: Self-pay | Admitting: Physician Assistant

## 2017-06-23 VITALS — BP 96/60 | HR 88 | Ht 63.5 in | Wt 170.6 lb

## 2017-06-23 DIAGNOSIS — R634 Abnormal weight loss: Secondary | ICD-10-CM | POA: Diagnosis not present

## 2017-06-23 DIAGNOSIS — R1084 Generalized abdominal pain: Secondary | ICD-10-CM | POA: Diagnosis not present

## 2017-06-23 DIAGNOSIS — R197 Diarrhea, unspecified: Secondary | ICD-10-CM | POA: Diagnosis not present

## 2017-06-23 NOTE — Progress Notes (Signed)
I agree with the above note, plan 

## 2017-06-23 NOTE — Patient Instructions (Signed)
Please purchase the following medications over the counter and take as directed: Gas X three times a day before meals  Decrease intake of caffienated beverages and gas producing foods to help with bloating.   Your physician has requested that you go to the basement for the following lab work before leaving today:
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
## 2017-06-25 ENCOUNTER — Encounter (HOSPITAL_COMMUNITY): Payer: PPO

## 2017-06-29 ENCOUNTER — Telehealth: Payer: Self-pay | Admitting: Internal Medicine

## 2017-06-29 ENCOUNTER — Telehealth: Payer: Self-pay

## 2017-06-29 DIAGNOSIS — R197 Diarrhea, unspecified: Secondary | ICD-10-CM

## 2017-06-29 NOTE — Telephone Encounter (Signed)
patient states she is unable to follow the instuctions for the  fecal tubes collection she was given 06-09-17 in our office, she  is very confused with the instructions. Per Vicie Mutters, please have [facility] to follow up with patient and proceed with the labs recommended/ordered at her 06-23-17 consult. Advised Patient to that the GI specialist ordered a similar lab, and we have sent a message to [facility] to reach out to patient for their recommendation and for further instructions.

## 2017-06-29 NOTE — Telephone Encounter (Signed)
-----   Message from [place], [place] sent at 06/29/2017 10:05 AM EST ----- Regarding: FW: Ova and parasite examination  Can you please call patient and have her complete O&P, Fecal Fat and GI pathogen panel through out lab. Thanks-JLL  ----- Message ----- From: Oliver Pila Sent: 06/29/2017   9:14 AM To: Vicie Mutters, PA-C, # Subject: Ova and parasite examination                   Patient has called our office anxious and confused. Patient stated she was not successful in properly completing the fecal fat lab tubes our office set home with the patient on 06-09-17. Instead of having her attempt the collection again, we would ask that your office contact patient and proceed with your recommendation of Ova and parasite examination. Thank you for your assistance and any further recommendations you would have for the care of this patient.  Thank you, Leonie Douglas Referral Coordinator  [facility] Dr. Unk Pinto Vicie Mutters, P.A. [facility] Direct Line: [phone] ext. [phone]  [phone] Fax [phone]

## 2017-06-29 NOTE — Telephone Encounter (Signed)
The pt has been advised and she will come in for stool testing as soon as convenient

## 2017-06-30 ENCOUNTER — Other Ambulatory Visit: Payer: PPO

## 2017-07-01 ENCOUNTER — Other Ambulatory Visit: Payer: Self-pay

## 2017-07-01 MED ORDER — ATORVASTATIN CALCIUM 40 MG PO TABS: 40.0000 mg | ORAL_TABLET | Freq: Every day | ORAL | 1 refills | Status: DC

## 2017-07-02 ENCOUNTER — Other Ambulatory Visit: Payer: PPO

## 2017-07-02 ENCOUNTER — Other Ambulatory Visit: Payer: Self-pay | Admitting: Internal Medicine

## 2017-07-02 DIAGNOSIS — R197 Diarrhea, unspecified: Secondary | ICD-10-CM

## 2017-07-03 ENCOUNTER — Other Ambulatory Visit: Payer: Self-pay

## 2017-07-03 ENCOUNTER — Telehealth: Payer: Self-pay | Admitting: Internal Medicine

## 2017-07-03 LAB — OVA AND PARASITE EXAMINATION
MICRO NUMBER:: 81289824
SPECIMEN QUALITY:: ADEQUATE

## 2017-07-03 MED ORDER — TINIDAZOLE 500 MG PO TABS: 2.0000 g | ORAL_TABLET | Freq: Once | ORAL | 0 refills | Status: AC

## 2017-07-03 NOTE — Telephone Encounter (Signed)
Elevate that leg as high as you can, get compression stocking from medical supply store, go to urgent are or ER if severe over weekend

## 2017-07-03 NOTE — Telephone Encounter (Signed)
right leg,ankle, and foot swelling. Can not wear shoe. what can she do about this?

## 2017-07-06 ENCOUNTER — Telehealth: Payer: Self-pay | Admitting: Physician Assistant

## 2017-07-06 ENCOUNTER — Other Ambulatory Visit: Payer: Self-pay | Admitting: *Deleted

## 2017-07-06 LAB — FECAL FAT, QUALITATIVE: FECAL FAT, QUALITATIVE: NORMAL

## 2017-07-06 MED ORDER — ATORVASTATIN CALCIUM 40 MG PO TABS: 40.0000 mg | ORAL_TABLET | Freq: Every day | ORAL | 1 refills | Status: DC

## 2017-07-06 NOTE — Telephone Encounter (Signed)
The pt advised to take 4 pills at once and only 1 time  She verbalized understanding and will call with any further concerns or questions.

## 2017-07-13 ENCOUNTER — Other Ambulatory Visit: Payer: Self-pay

## 2017-07-13 DIAGNOSIS — I10 Essential (primary) hypertension: Secondary | ICD-10-CM

## 2017-07-13 MED ORDER — LOSARTAN POTASSIUM 100 MG PO TABS: ORAL_TABLET | ORAL | 1 refills | Status: DC

## 2017-07-13 NOTE — Telephone Encounter (Signed)
Refill request for Losartan Potassium 100 mg tab. Take 1 tablet by mouth every day for blood pressure and kidney protection. #90

## 2017-07-14 ENCOUNTER — Encounter (HOSPITAL_COMMUNITY): Payer: PPO

## 2017-07-15 ENCOUNTER — Encounter (HOSPITAL_COMMUNITY): Payer: PPO

## 2017-07-21 LAB — GASTROINTESTINAL PATHOGEN PANEL PCR
C. difficile Tox A/B, PCR: NOT DETECTED
Campylobacter, PCR: NOT DETECTED
Cryptosporidium, PCR: NOT DETECTED
E coli (ETEC) LT/ST PCR: NOT DETECTED
E coli (STEC) stx1/stx2, PCR: NOT DETECTED
E coli 0157, PCR: NOT DETECTED
Giardia lamblia, PCR: NOT DETECTED
Norovirus, PCR: NOT DETECTED
Rotavirus A, PCR: NOT DETECTED
Salmonella, PCR: NOT DETECTED
Shigella, PCR: NOT DETECTED

## 2017-07-28 ENCOUNTER — Inpatient Hospital Stay (HOSPITAL_COMMUNITY)
Admission: RE | Admit: 2017-07-28 | Payer: PPO | Source: Ambulatory Visit | Attending: Physician Assistant | Admitting: Physician Assistant

## 2017-08-03 ENCOUNTER — Other Ambulatory Visit: Payer: Self-pay | Admitting: *Deleted

## 2017-08-03 MED ORDER — ALLOPURINOL 300 MG PO TABS: 300.0000 mg | ORAL_TABLET | Freq: Every morning | ORAL | 1 refills | Status: DC

## 2017-08-13 ENCOUNTER — Ambulatory Visit: Payer: Self-pay | Admitting: Internal Medicine

## 2017-08-13 ENCOUNTER — Telehealth: Payer: Self-pay | Admitting: Internal Medicine

## 2017-08-13 NOTE — Telephone Encounter (Signed)
Patient called to advise: Hair, skin, and nails pills you recommended contain sulfur and patient can not take. please make a new recommendation.

## 2017-08-14 ENCOUNTER — Telehealth: Payer: Self-pay | Admitting: *Deleted

## 2017-08-14 NOTE — Telephone Encounter (Signed)
Patient called and states she cannot take Hair, Skin and Nails due to having sulfa in it.  Per Dr Melford Aase, change to Biotin, Zinc 50 mg and MVI daily.

## 2017-08-16 ENCOUNTER — Other Ambulatory Visit: Payer: Self-pay

## 2017-08-16 ENCOUNTER — Encounter (HOSPITAL_COMMUNITY): Payer: Self-pay

## 2017-08-16 ENCOUNTER — Emergency Department (HOSPITAL_COMMUNITY)
Admission: EM | Admit: 2017-08-16 | Discharge: 2017-08-16 | Disposition: A | Discharge status: Home / Self Care | Payer: PPO | Attending: Emergency Medicine | Admitting: Emergency Medicine

## 2017-08-16 DIAGNOSIS — W010XXA Fall on same level from slipping, tripping and stumbling without subsequent striking against object, initial encounter: Secondary | ICD-10-CM | POA: Diagnosis not present

## 2017-08-16 DIAGNOSIS — Z79899 Other long term (current) drug therapy: Secondary | ICD-10-CM | POA: Diagnosis not present

## 2017-08-16 DIAGNOSIS — Z794 Long term (current) use of insulin: Secondary | ICD-10-CM | POA: Insufficient documentation

## 2017-08-16 DIAGNOSIS — Y929 Unspecified place or not applicable: Secondary | ICD-10-CM | POA: Diagnosis not present

## 2017-08-16 DIAGNOSIS — F17210 Nicotine dependence, cigarettes, uncomplicated: Secondary | ICD-10-CM | POA: Insufficient documentation

## 2017-08-16 DIAGNOSIS — Y939 Activity, unspecified: Secondary | ICD-10-CM | POA: Diagnosis not present

## 2017-08-16 DIAGNOSIS — Z8544 Personal history of malignant neoplasm of other female genital organs: Secondary | ICD-10-CM | POA: Diagnosis not present

## 2017-08-16 DIAGNOSIS — S51812A Laceration without foreign body of left forearm, initial encounter: Secondary | ICD-10-CM | POA: Diagnosis not present

## 2017-08-16 DIAGNOSIS — W19XXXA Unspecified fall, initial encounter: Secondary | ICD-10-CM

## 2017-08-16 DIAGNOSIS — J449 Chronic obstructive pulmonary disease, unspecified: Secondary | ICD-10-CM | POA: Insufficient documentation

## 2017-08-16 DIAGNOSIS — N183 Chronic kidney disease, stage 3 (moderate): Secondary | ICD-10-CM | POA: Insufficient documentation

## 2017-08-16 DIAGNOSIS — S59812A Other specified injuries left forearm, initial encounter: Secondary | ICD-10-CM | POA: Diagnosis present

## 2017-08-16 DIAGNOSIS — Z7982 Long term (current) use of aspirin: Secondary | ICD-10-CM | POA: Diagnosis not present

## 2017-08-16 DIAGNOSIS — I10 Essential (primary) hypertension: Secondary | ICD-10-CM

## 2017-08-16 DIAGNOSIS — Y999 Unspecified external cause status: Secondary | ICD-10-CM | POA: Insufficient documentation

## 2017-08-16 DIAGNOSIS — E1122 Type 2 diabetes mellitus with diabetic chronic kidney disease: Secondary | ICD-10-CM | POA: Diagnosis not present

## 2017-08-16 DIAGNOSIS — I129 Hypertensive chronic kidney disease with stage 1 through stage 4 chronic kidney disease, or unspecified chronic kidney disease: Secondary | ICD-10-CM | POA: Diagnosis not present

## 2017-08-16 DIAGNOSIS — E039 Hypothyroidism, unspecified: Secondary | ICD-10-CM | POA: Diagnosis not present

## 2017-08-16 DIAGNOSIS — S51811A Laceration without foreign body of right forearm, initial encounter: Secondary | ICD-10-CM

## 2017-08-16 MED ORDER — ACETAMINOPHEN 325 MG PO TABS
650.0000 mg | ORAL_TABLET | Freq: Once | ORAL | Status: AC
  Administered 2017-08-16: 650 mg via ORAL
  Filled 2017-08-16: qty 2

## 2017-08-16 NOTE — ED Notes (Signed)
Applied Bacitracin to pt's skin tear. Telfa placed over pt's wound and wrapped with stretch gauze.
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
## 2017-08-16 NOTE — Discharge Instructions (Signed)
It was our pleasure to provide your ER care today - we hope that you feel better.  Keep wound area very clean.  Fall precautions - use your cane when up/walking to help your stability.  Continue your blood pressure medication, and follow up with your doctor for recheck of your blood pressure in the next 1-2 weeks.   Return to ER if worse, new symptoms, other concern.

## 2017-08-16 NOTE — ED Triage Notes (Signed)
Pt report she is diabetic and has frequent falls. Pt reports she hit right forearm on the corner of bar and has skin tear to arm. No other complaints

## 2017-08-17 ENCOUNTER — Other Ambulatory Visit: Payer: Self-pay

## 2017-08-17 NOTE — Patient Outreach (Signed)
Outreach patient after ED visit.  She verified PCP is current and has a follow up appointment scheduled.  Patient states she does have transportation issues.  I explained how to contact PCP after hours and the 24 Hour Nurse Advice Line.  She made note of the number for the 24 Hour Nurse Advice Line. I explained how the advice line works and [employer].  She completed engagement tool and I explained that one of my team members will be following up with her within 7 to 10 days.  She has 4 to 5 upcoming Doctor appointments and getting help with transportation would be a great help.

## 2017-08-19 ENCOUNTER — Other Ambulatory Visit: Payer: Self-pay | Admitting: *Deleted

## 2017-08-19 NOTE — Patient Outreach (Signed)
[facility]) Care Management  08/19/2017  Lynzy Rawles Menn Mar 19, 1946 [id number]   Telephone Screen  Referral Date: 08/17/17 Referral Source: U[employer]A [employer] CM Eligible - ED Census Referral Reason: ED Census Insurance: HTA   Outreach attempt # 1 to patient. HIPAA identifiers X's 2 verified. Patient confirmed, she had an emergency room visit related to a severe fall. She was ambulating without her cane when she fell and injured her ankle. Her husband had to assist her with getting off of the floor and he transported her to the emergency room. Patient injured her hand causing a skin tear. Patient stated, she's had multiple falls, however this is the first times she has injured herself. She stated, she is starting to require assistance with 'getting up from the floor". Patient reported, she is usually home alone. Her husband works very odd hours, per patient. Patient stated, her 71 year old mother was transporting her to all of her medical appointments. Her mother recently had a stroke and her driving privileges were restricted, per patient. Patient verbalized having difficulty with getting to her medical appointments due to her family's lack of availability. Patient discussed having a history of depression, manic bipolar, not bathing, not cleaning her home, and she barely visits her Psychiatrist. [facility] services and benefits explained to patient. Patient agreed to services. Patient requested to meet [facility] SW and Pharmacist away from her home, if needed.  Plan: RN CM will send [facility] SW referral for possible assistance with community resources related to transportation, safety, and depression. RN CM will send [facility] pharmacy referral for polypharmacy. RN CM advised patient to contact RN CM for any needs or concerns.    Lake Bells, RN, BSN, MHA/MSL, [place] Telephonic Care Manager Coordinator [facility] Direct Phone: [phone] Cell Phone: [phone] Toll Free:  [phone] Fax: [phone]

## 2017-08-20 ENCOUNTER — Encounter: Payer: Self-pay | Admitting: *Deleted

## 2017-08-20 ENCOUNTER — Other Ambulatory Visit: Payer: Self-pay | Admitting: Physician Assistant

## 2017-08-20 ENCOUNTER — Telehealth: Payer: Self-pay | Admitting: Pharmacist

## 2017-08-20 ENCOUNTER — Ambulatory Visit (HOSPITAL_COMMUNITY)
Admission: RE | Admit: 2017-08-20 | Discharge: 2017-08-20 | Disposition: A | Discharge status: Home / Self Care | Payer: PPO | Source: Ambulatory Visit | Attending: Cardiovascular Disease | Admitting: Cardiovascular Disease

## 2017-08-20 DIAGNOSIS — J449 Chronic obstructive pulmonary disease, unspecified: Secondary | ICD-10-CM | POA: Insufficient documentation

## 2017-08-20 DIAGNOSIS — R0989 Other specified symptoms and signs involving the circulatory and respiratory systems: Secondary | ICD-10-CM

## 2017-08-20 DIAGNOSIS — I10 Essential (primary) hypertension: Secondary | ICD-10-CM | POA: Insufficient documentation

## 2017-08-20 DIAGNOSIS — E1140 Type 2 diabetes mellitus with diabetic neuropathy, unspecified: Secondary | ICD-10-CM | POA: Insufficient documentation

## 2017-08-20 DIAGNOSIS — F17200 Nicotine dependence, unspecified, uncomplicated: Secondary | ICD-10-CM | POA: Diagnosis not present

## 2017-08-20 DIAGNOSIS — E785 Hyperlipidemia, unspecified: Secondary | ICD-10-CM | POA: Insufficient documentation

## 2017-08-20 DIAGNOSIS — Z8673 Personal history of transient ischemic attack (TIA), and cerebral infarction without residual deficits: Secondary | ICD-10-CM | POA: Insufficient documentation

## 2017-08-20 NOTE — Patient Outreach (Signed)
[facility] [facility]) Care Management  08/20/2017  Ashley [name] Dina Oct 26, 1945 [id number]   Patient was called regarding medication assistance. Unfortunately, she did not answer the phone. HIPAA compliant message was left on her voicemail.  Plan: Call patient back in 1-3 business days.   Elayne Guerin, PharmD, [place] Clinical Pharmacist [phone]

## 2017-08-21 ENCOUNTER — Other Ambulatory Visit: Payer: Self-pay | Admitting: Pharmacist

## 2017-08-21 ENCOUNTER — Other Ambulatory Visit: Payer: Self-pay | Admitting: *Deleted

## 2017-08-21 ENCOUNTER — Encounter: Payer: Self-pay | Admitting: *Deleted

## 2017-08-21 ENCOUNTER — Other Ambulatory Visit: Payer: Self-pay | Admitting: Physician Assistant

## 2017-08-21 DIAGNOSIS — M79605 Pain in left leg: Secondary | ICD-10-CM

## 2017-08-21 DIAGNOSIS — R6889 Other general symptoms and signs: Secondary | ICD-10-CM

## 2017-08-21 DIAGNOSIS — M79604 Pain in right leg: Secondary | ICD-10-CM

## 2017-08-21 NOTE — Patient Outreach (Signed)
[place]) Care Management  08/21/2017  Julyanna Scholle [name] Ashley [name] [id number]   Patient was called regarding medication assistance per referral. HIPAA identifiers were obtained. Patient is a 72 year old female with multiple medical conditions including but not limited to:  Bipolar disorder, COPD, hyperlipidemia, Gout, type 2 diabetes, hypertension, vitamin d deficiency and compulsive eating disorder.  Although patient answered the phone, I was not able to complete a medication review because she wanted to discuss transportation.  [facility] Social Worker, Product manager spoke with the patient this morning and documented that the patient was not interested in transportation.  The patient became tearful during our discussion and said she really needs free transportation because she is on a fixed income and because her husband still works and is not available to take her to appointments. An inbasket message was sent to [facility] requesting the patient be called back due to the confusion about transportation.  Just as we were getting ready to discuss medications, the patient received a phone call from her brother who lives out of town and asked that I call her back later.  Plan: Call patient back later today or within the next 1-3 business days   Elayne Guerin, PharmD, [place] Clinical Pharmacist [phone]

## 2017-08-21 NOTE — Patient Outreach (Signed)
Request received from Joanna Saporito, LCSW to mail patient personal care resources.  Information mailed today. 

## 2017-08-21 NOTE — Patient Outreach (Signed)
[name]) Care [name]  08/21/2017  Ashley [name] [birth year] [id number]  CSW was able to make initial contact with patient today to perform phone assessment, as well as assess and assist with social work needs and services.  CSW introduced self, explained role and types of services provided through [name] ([name]).  CSW further explained to patient that CSW works with patient's RNCM, also with Ashley [name], Ashley [name]. CSW then explained the reason for the call, indicating that Ashley [name] thought that patient would benefit from social work services and resources to assist with arranging transportation to and from physician appointments, as well as providing resources for anxiety and depression.  CSW obtained two HIPAA compliant identifiers from patient, which included patient's name and date of birth. Patient reported that she is no longer in need of transportation assistance, as her husband, Ashley [name] has agreed to transport her to and from all her physician appointments.  Patient went on to say that she is unable to walk for any length of time because she becomes too weak and fatigued; therefore, Mr. Costin has to transport her in her wheelchair.  Patient was not interested in public transportation through [employer] [facility]) or [employer] ([place]).  Nor was patient interested in receiving transportation services through [employer] with [employer] [place], even though it is free of charge and volunteer based. Patient indicated that she is managing her depression well, not needing resources regarding depression or a referral for services.  Patient went on to say that she currently sees Ashley [name], Psychologist and Ashley [name], Psychiatrist. Patient has been seeing Ashley [name] for 42 years and visits him every two weeks.  Patient continues to undergo  psychotherapy services, understanding that she must take her medications and adhere to her treatment plan to manage her Bipolar Disorder.  Patient is currently being prescribed the following psychotropic medications:  Prozac and Klonopin.  Patient admits to taking her medications exactly as prescribed. CSW will perform a case closure on patient, as all goals of treatment have been met from social work standpoint and no additional social work needs have been identified at this time.  CSW will notify patient's Pharmacist with [place] [name], [name] of CSW's plans to close patient's case.  CSW will fax an update to patient's Primary Care Physician, Dr. Unk Pinto to ensure that they are aware of CSW's involvement with patient's plan of care.  CSW will submit a case closure request to [name], Care [name] Assistant with [place] [name], in the form of an [employer].  CSW will ensure that [name] is aware of [name], Pharmacist with [place] [name], continued involvement with patient's care. [name], BSW, MSW, LCSW  Licensed Education officer, [facility]  Mailing [place] [address] [place], [place] [address] Physical Address-[address], [place], [place] [address] Toll Free Main # [phone] Fax # [phone] Cell # [phone]  Office # [phone] [email or web address]

## 2017-08-24 ENCOUNTER — Ambulatory Visit: Payer: Self-pay | Admitting: Pharmacist

## 2017-08-24 ENCOUNTER — Other Ambulatory Visit: Payer: Self-pay | Admitting: Pharmacist

## 2017-08-24 NOTE — Patient Outreach (Addendum)
[facility]) Care Management  08/24/2017  Ashley [name] 1946-06-01 [id number]  Patient was called regarding medication assistance. Unfortunately, she did not answer the phone. HIPAA compliant message was left on her voicemail.  Plan: Call patient back in 3-5 business days.  Elayne Guerin, PharmD, [place] Clinical Pharmacist [phone]

## 2017-08-27 ENCOUNTER — Encounter: Payer: Self-pay | Admitting: Pharmacist

## 2017-08-27 ENCOUNTER — Other Ambulatory Visit: Payer: Self-pay | Admitting: Pharmacist

## 2017-08-27 NOTE — Patient Outreach (Signed)
[place] [facility]) Care Management  08/27/2017  Ashley [name] May 01, 1946 [id number]   Patient was called regarding medication assistance. Unfortunately, she did not answer the phone. HIPAA compliant message was left on her voicemail. Today's call was the 2nd unsuccessful attempt.  Plan: Call patient back in 3-5 business days.  Elayne Guerin, PharmD, [place] Clinical Pharmacist [phone]

## 2017-08-28 ENCOUNTER — Encounter: Payer: Self-pay | Admitting: Pharmacist

## 2017-08-28 ENCOUNTER — Other Ambulatory Visit: Payer: Self-pay | Admitting: Pharmacist

## 2017-08-28 NOTE — Patient Outreach (Signed)
[facility]) Care Management  08/28/2017  Ricka Westra Szuch Jul 18, 1946 [id number]   Patient was called regarding medication assistance.Unfortunately, she did not answer the phone. HIPAA compliant message was left on her voicemail. Today's call was the 2nd unsuccessful attempt.  Plan: Send patient an unsuccessful contact letter Close pharmacy case if I do not hear back from her.  Elayne Guerin, PharmD, [place] Clinical Pharmacist [phone]

## 2017-09-01 ENCOUNTER — Ambulatory Visit: Payer: PPO | Admitting: Gastroenterology

## 2017-09-01 ENCOUNTER — Encounter: Payer: Self-pay | Admitting: Gastroenterology

## 2017-09-01 VITALS — BP 142/76 | HR 84 | Ht 63.5 in | Wt 171.0 lb

## 2017-09-01 DIAGNOSIS — R197 Diarrhea, unspecified: Secondary | ICD-10-CM | POA: Diagnosis not present

## 2017-09-01 MED ORDER — PEG 3350-KCL-NA BICARB-NACL 420 G PO SOLR: 4000.0000 mL | Freq: Once | ORAL | 0 refills | Status: AC
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
## 2017-09-01 NOTE — Patient Instructions (Addendum)
You drink an extreme amount of caffeine and that can contribute to your loose stools. Please try to cut back your Diet Cokes (and any other caffeine containing drinks) down to zero if you can.    Please start taking a single imodium pill shortly after waking every morning.  You will be set up for a colonoscopy for diarrhea (WL with MAC sedation).  Normal BMI (Body Mass Index- based on height and weight) is between 23 and 30. Your BMI today is Body mass index is 29.82 kg/m. Marland Kitchen Please consider follow up  regarding your BMI with your Primary Care Provider.
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
## 2017-09-10 ENCOUNTER — Ambulatory Visit (INDEPENDENT_AMBULATORY_CARE_PROVIDER_SITE_OTHER): Payer: PPO | Admitting: Internal Medicine

## 2017-09-10 VITALS — BP 122/84 | HR 84 | Temp 97.7°F | Resp 16 | Ht 63.5 in | Wt 169.2 lb

## 2017-09-10 DIAGNOSIS — E782 Mixed hyperlipidemia: Secondary | ICD-10-CM

## 2017-09-10 DIAGNOSIS — E1122 Type 2 diabetes mellitus with diabetic chronic kidney disease: Secondary | ICD-10-CM | POA: Diagnosis not present

## 2017-09-10 DIAGNOSIS — E119 Type 2 diabetes mellitus without complications: Secondary | ICD-10-CM

## 2017-09-10 DIAGNOSIS — E039 Hypothyroidism, unspecified: Secondary | ICD-10-CM | POA: Diagnosis not present

## 2017-09-10 DIAGNOSIS — E559 Vitamin D deficiency, unspecified: Secondary | ICD-10-CM | POA: Diagnosis not present

## 2017-09-10 DIAGNOSIS — N183 Chronic kidney disease, stage 3 unspecified: Secondary | ICD-10-CM

## 2017-09-10 DIAGNOSIS — Z79899 Other long term (current) drug therapy: Secondary | ICD-10-CM | POA: Diagnosis not present

## 2017-09-10 DIAGNOSIS — I10 Essential (primary) hypertension: Secondary | ICD-10-CM

## 2017-09-10 DIAGNOSIS — F332 Major depressive disorder, recurrent severe without psychotic features: Secondary | ICD-10-CM | POA: Diagnosis not present

## 2017-09-10 DIAGNOSIS — Z794 Long term (current) use of insulin: Secondary | ICD-10-CM | POA: Diagnosis not present

## 2017-09-10 DIAGNOSIS — G63 Polyneuropathy in diseases classified elsewhere: Secondary | ICD-10-CM | POA: Diagnosis not present

## 2017-09-10 DIAGNOSIS — R632 Polyphagia: Secondary | ICD-10-CM

## 2017-09-10 DIAGNOSIS — M1000 Idiopathic gout, unspecified site: Secondary | ICD-10-CM

## 2017-09-10 MED ORDER — METFORMIN HCL ER 500 MG PO TB24: ORAL_TABLET | ORAL | 1 refills | Status: DC
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
## 2017-09-10 NOTE — Patient Instructions (Signed)

## 2017-09-11 LAB — HEPATIC FUNCTION PANEL
AG Ratio: 1.6 (calc) (ref 1.0–2.5)
ALT: 19 U/L (ref 6–29)
AST: 19 U/L (ref 10–35)
Albumin: 3.9 g/dL (ref 3.6–5.1)
Alkaline phosphatase (APISO): 36 U/L (ref 33–130)
Bilirubin, Direct: 0.1 mg/dL (ref 0.0–0.2)
Globulin: 2.4 g/dL (calc) (ref 1.9–3.7)
Indirect Bilirubin: 0.2 mg/dL (calc) (ref 0.2–1.2)
Total Bilirubin: 0.3 mg/dL (ref 0.2–1.2)
Total Protein: 6.3 g/dL (ref 6.1–8.1)

## 2017-09-11 LAB — CBC WITH DIFFERENTIAL/PLATELET
Basophils Absolute: 39 cells/uL (ref 0–200)
Basophils Relative: 0.5 %
Eosinophils Absolute: 139 cells/uL (ref 15–500)
Eosinophils Relative: 1.8 %
HCT: 34.8 % — ABNORMAL LOW (ref 35.0–45.0)
Hemoglobin: 12 g/dL (ref 11.7–15.5)
Lymphs Abs: 1833 cells/uL (ref 850–3900)
MCH: 29.9 pg (ref 27.0–33.0)
MCHC: 34.5 g/dL (ref 32.0–36.0)
MCV: 86.8 fL (ref 80.0–100.0)
MPV: 9.7 fL (ref 7.5–12.5)
Monocytes Relative: 8.1 %
Neutro Abs: 5067 cells/uL (ref 1500–7800)
Neutrophils Relative %: 65.8 %
Platelets: 401 10*3/uL — ABNORMAL HIGH (ref 140–400)
RBC: 4.01 10*6/uL (ref 3.80–5.10)
RDW: 13.5 % (ref 11.0–15.0)
Total Lymphocyte: 23.8 %
WBC mixed population: 624 cells/uL (ref 200–950)
WBC: 7.7 10*3/uL (ref 3.8–10.8)

## 2017-09-11 LAB — BASIC METABOLIC PANEL WITH GFR
BUN/Creatinine Ratio: 20 (calc) (ref 6–22)
BUN: 26 mg/dL — ABNORMAL HIGH (ref 7–25)
CO2: 28 mmol/L (ref 20–32)
Calcium: 10.4 mg/dL (ref 8.6–10.4)
Chloride: 97 mmol/L — ABNORMAL LOW (ref 98–110)
Creat: 1.33 mg/dL — ABNORMAL HIGH (ref 0.60–0.93)
GFR, Est African American: 47 mL/min/{1.73_m2} — ABNORMAL LOW (ref >=60)
GFR, Est Non African American: 40 mL/min/{1.73_m2} — ABNORMAL LOW (ref >=60)
Glucose, Bld: 157 mg/dL — ABNORMAL HIGH (ref 65–99)
Potassium: 4.5 mmol/L (ref 3.5–5.3)
Sodium: 134 mmol/L — ABNORMAL LOW (ref 135–146)

## 2017-09-11 LAB — LIPID PANEL
Cholesterol: 174 mg/dL (ref <200)
HDL: 51 mg/dL (ref >50)
LDL Cholesterol (Calc): 94 mg/dL (calc)
Non-HDL Cholesterol (Calc): 123 mg/dL (calc) (ref <130)
Total CHOL/HDL Ratio: 3.4 (calc) (ref <5.0)
Triglycerides: 195 mg/dL — ABNORMAL HIGH (ref <150)

## 2017-09-11 LAB — HEMOGLOBIN A1C
Hgb A1c MFr Bld: 8 % of total Hgb — ABNORMAL HIGH (ref <5.7)
Mean Plasma Glucose: 183 (calc)
eAG (mmol/L): 10.1 (calc)

## 2017-09-11 LAB — TSH: TSH: 1.68 mIU/L (ref 0.40–4.50)

## 2017-09-11 LAB — URIC ACID: Uric Acid, Serum: 3.1 mg/dL (ref 2.5–7.0)

## 2017-09-11 LAB — VITAMIN D 25 HYDROXY (VIT D DEFICIENCY, FRACTURES): Vit D, 25-Hydroxy: 26 ng/mL — ABNORMAL LOW (ref 30–100)

## 2017-09-11 LAB — MAGNESIUM: Magnesium: 1.3 mg/dL — ABNORMAL LOW (ref 1.5–2.5)

## 2017-09-11 NOTE — Progress Notes (Signed)
-   Kidney functions (GFR 40 ) are stable in stage 3 Kidney failure   - Magnesium - 1.3 - very Very 2 wlow- goal is betw 2.0 - 2.5, so recommend that you take Magnesium 500 mg tablet  X 3 tablets / daily - at meal times - also important to eat lots of leafy green vegetables  - Chol 174 and LDL 94 are great - Keep Lipitor - artorvastatin same   - A1c 8.0% - still too high -  Absolutely must get on a better diet and lose weight  before go blind, have a massive heart attack or stroke, end up on Dialysis or lose one or both legs or All of the above   - Vit D 26 is Dangerously low - goal is between 70-100, so Vit D 5,000 unit caps x 2 caps = 10,000 units EVERY day  - Uric Acid / Gout test is OK - so continue Allopurinol same   - Kidney functions already stage 3 - already heading toward the Dialysis machine !  - All Else - CBC  - Electrolytes -  Liver - Magnesium & Thyroid all Nl/OK   - Being diabetic has a  300% increased risk for heart attack, stroke, cancer, and alzheimer- type vascular dementia.  -  It is very important that you work harder with diet by avoiding all foods that are white except chicken,  fish & calliflower.  - Avoid white rice (brown & wild rice is OK), white potatoes (sweetpotatoes in moderation is OK), White bread or wheat bread or anything made out of white flour like bagels, donuts, rolls, buns, biscuits, cakes, pastries, cookies, pizza crust, and pasta (made from white flour & egg whites) - vegetarian pasta or spinach or wheat pasta is OK.   Multigrain breads like Arnold's, Pepperidge Farm or multigrain sandwich thins or high fiber breads like Eureka bread or "Dave's Killer" breads that are 4 to 5 grams fiber per slice !  are best.    Diet, exercise and weight loss can reverse and cure diabetes in the early stages.    - Diet, exercise and weight loss is very important in the control and prevention of complications of diabetes which affects every system in your body,  ie. Brain - dementia/stroke, eyes - glaucoma/blindness, heart - heart attack/heart failure, kidneys - dialysis, stomach - gastric paralysis, intestines - malabsorption, nerves - severe painful neuritis, circulation - gangrene & loss of a leg(s), and finally cancer and Alzheimers.

## 2017-09-12 ENCOUNTER — Encounter: Payer: Self-pay | Admitting: Internal Medicine

## 2017-09-14 ENCOUNTER — Other Ambulatory Visit: Payer: Self-pay | Admitting: Pharmacist

## 2017-09-14 NOTE — Patient Outreach (Signed)
[place] [facility]) Care Management  09/14/2017  Ashley [name] 05-May-1946 [id number]   Letters were sent to the patient and her provider due to lack of contact.  [facility] Community and Social Work have already closed the patient's case.  Plan: Sent notice to CMA to officially close the patient's case.   Elayne Guerin, PharmD, [place] Clinical Pharmacist [phone]

## 2017-09-14 NOTE — Patient Outreach (Signed)
[place] [facility]) Care Management  09/14/2017  Shuntae Herzig [place] February 09, 1946 [id number]   Case closed due to lack of contact. Closure letters sent to patient and his provider.   [facility] Social Work and Community have already closed the patient's case.  Note will be sent to the [place] for full case closure.  Elayne Guerin, PharmD, [place] Clinical Pharmacist [phone]

## 2017-09-17 ENCOUNTER — Other Ambulatory Visit: Payer: Self-pay | Admitting: Internal Medicine

## 2017-09-21 ENCOUNTER — Telehealth: Payer: Self-pay | Admitting: Gastroenterology

## 2017-09-21 NOTE — Telephone Encounter (Signed)
The pt has been rescheduled to 10/01/17 at 830 am endo aware and pt re instructed

## 2017-09-23 ENCOUNTER — Other Ambulatory Visit: Payer: Self-pay

## 2017-09-23 ENCOUNTER — Encounter (HOSPITAL_COMMUNITY): Payer: Self-pay

## 2017-10-01 ENCOUNTER — Encounter (HOSPITAL_COMMUNITY)
Admission: RE | Disposition: A | Discharge status: Home / Self Care | Payer: Self-pay | Source: Ambulatory Visit | Attending: Gastroenterology

## 2017-10-01 ENCOUNTER — Other Ambulatory Visit: Payer: Self-pay

## 2017-10-01 ENCOUNTER — Ambulatory Visit (HOSPITAL_COMMUNITY): Payer: PPO | Admitting: Certified Registered Nurse Anesthetist

## 2017-10-01 ENCOUNTER — Encounter (HOSPITAL_COMMUNITY): Payer: Self-pay | Admitting: Gastroenterology

## 2017-10-01 ENCOUNTER — Ambulatory Visit (HOSPITAL_COMMUNITY)
Admission: RE | Admit: 2017-10-01 | Discharge: 2017-10-01 | Disposition: A | Discharge status: Home / Self Care | Payer: PPO | Source: Ambulatory Visit | Attending: Gastroenterology | Admitting: Gastroenterology

## 2017-10-01 DIAGNOSIS — F319 Bipolar disorder, unspecified: Secondary | ICD-10-CM | POA: Diagnosis not present

## 2017-10-01 DIAGNOSIS — K219 Gastro-esophageal reflux disease without esophagitis: Secondary | ICD-10-CM | POA: Diagnosis not present

## 2017-10-01 DIAGNOSIS — Z86010 Personal history of colonic polyps: Secondary | ICD-10-CM | POA: Diagnosis not present

## 2017-10-01 DIAGNOSIS — E7800 Pure hypercholesterolemia, unspecified: Secondary | ICD-10-CM | POA: Diagnosis not present

## 2017-10-01 DIAGNOSIS — Z882 Allergy status to sulfonamides status: Secondary | ICD-10-CM | POA: Diagnosis not present

## 2017-10-01 DIAGNOSIS — E785 Hyperlipidemia, unspecified: Secondary | ICD-10-CM | POA: Diagnosis not present

## 2017-10-01 DIAGNOSIS — D122 Benign neoplasm of ascending colon: Secondary | ICD-10-CM | POA: Diagnosis not present

## 2017-10-01 DIAGNOSIS — F429 Obsessive-compulsive disorder, unspecified: Secondary | ICD-10-CM | POA: Diagnosis not present

## 2017-10-01 DIAGNOSIS — Z8544 Personal history of malignant neoplasm of other female genital organs: Secondary | ICD-10-CM | POA: Insufficient documentation

## 2017-10-01 DIAGNOSIS — D513 Other dietary vitamin B12 deficiency anemia: Secondary | ICD-10-CM | POA: Diagnosis not present

## 2017-10-01 DIAGNOSIS — Z8249 Family history of ischemic heart disease and other diseases of the circulatory system: Secondary | ICD-10-CM | POA: Insufficient documentation

## 2017-10-01 DIAGNOSIS — F419 Anxiety disorder, unspecified: Secondary | ICD-10-CM | POA: Insufficient documentation

## 2017-10-01 DIAGNOSIS — K635 Polyp of colon: Secondary | ICD-10-CM

## 2017-10-01 DIAGNOSIS — J45909 Unspecified asthma, uncomplicated: Secondary | ICD-10-CM | POA: Diagnosis not present

## 2017-10-01 DIAGNOSIS — Z79899 Other long term (current) drug therapy: Secondary | ICD-10-CM | POA: Diagnosis not present

## 2017-10-01 DIAGNOSIS — M1990 Unspecified osteoarthritis, unspecified site: Secondary | ICD-10-CM | POA: Diagnosis not present

## 2017-10-01 DIAGNOSIS — K630 Abscess of intestine: Secondary | ICD-10-CM | POA: Diagnosis not present

## 2017-10-01 DIAGNOSIS — K529 Noninfective gastroenteritis and colitis, unspecified: Secondary | ICD-10-CM | POA: Insufficient documentation

## 2017-10-01 DIAGNOSIS — F17210 Nicotine dependence, cigarettes, uncomplicated: Secondary | ICD-10-CM | POA: Diagnosis not present

## 2017-10-01 DIAGNOSIS — K52832 Lymphocytic colitis: Secondary | ICD-10-CM

## 2017-10-01 DIAGNOSIS — Z7982 Long term (current) use of aspirin: Secondary | ICD-10-CM | POA: Diagnosis not present

## 2017-10-01 DIAGNOSIS — Z794 Long term (current) use of insulin: Secondary | ICD-10-CM | POA: Diagnosis not present

## 2017-10-01 DIAGNOSIS — E1140 Type 2 diabetes mellitus with diabetic neuropathy, unspecified: Secondary | ICD-10-CM | POA: Insufficient documentation

## 2017-10-01 DIAGNOSIS — I10 Essential (primary) hypertension: Secondary | ICD-10-CM | POA: Diagnosis not present

## 2017-10-01 DIAGNOSIS — R197 Diarrhea, unspecified: Secondary | ICD-10-CM

## 2017-10-01 DIAGNOSIS — M109 Gout, unspecified: Secondary | ICD-10-CM | POA: Insufficient documentation

## 2017-10-01 DIAGNOSIS — Z7984 Long term (current) use of oral hypoglycemic drugs: Secondary | ICD-10-CM | POA: Diagnosis not present

## 2017-10-01 HISTORY — DX: Chronic kidney disease, unspecified: N18.9

## 2017-10-01 HISTORY — PX: COLONOSCOPY WITH PROPOFOL: SHX5780

## 2017-10-01 LAB — GLUCOSE, CAPILLARY: Glucose-Capillary: 188 mg/dL — ABNORMAL HIGH (ref 65–99)

## 2017-10-01 SURGERY — COLONOSCOPY WITH PROPOFOL
Anesthesia: Monitor Anesthesia Care

## 2017-10-01 MED ORDER — PROPOFOL 10 MG/ML IV BOLUS
INTRAVENOUS | Status: DC | PRN
  Administered 2017-10-01 (×2): 20 mg via INTRAVENOUS

## 2017-10-01 MED ORDER — LACTATED RINGERS IV SOLN
INTRAVENOUS | Status: DC
Start: 2017-10-01 — End: 2017-10-01
  Administered 2017-10-01: 08:00:00 via INTRAVENOUS

## 2017-10-01 MED ORDER — SODIUM CHLORIDE 0.9 % IV SOLN
INTRAVENOUS | Status: DC
Start: 2017-10-01 — End: 2017-10-01

## 2017-10-01 MED ORDER — PROPOFOL 10 MG/ML IV BOLUS
INTRAVENOUS | Status: AC
  Filled 2017-10-01: qty 60

## 2017-10-01 MED ORDER — PROPOFOL 500 MG/50ML IV EMUL
INTRAVENOUS | Status: DC | PRN
  Administered 2017-10-01: 75 ug/kg/min via INTRAVENOUS

## 2017-10-01 MED ORDER — ONDANSETRON HCL 4 MG/2ML IJ SOLN
INTRAMUSCULAR | Status: DC | PRN
  Administered 2017-10-01: 4 mg via INTRAVENOUS

## 2017-10-01 MED ORDER — LIDOCAINE 2% (20 MG/ML) 5 ML SYRINGE
INTRAMUSCULAR | Status: DC | PRN
  Administered 2017-10-01: 80 mg via INTRAVENOUS

## 2017-10-01 SURGICAL SUPPLY — 21 items

## 2017-10-01 NOTE — Interval H&P Note (Signed)
History and Physical Interval Note:  10/01/2017 7:35 AM  Ashley [name]  has presented today for surgery, with the diagnosis of diarrhea  The various methods of treatment have been discussed with the patient and family. After consideration of risks, benefits and other options for treatment, the patient has consented to  Procedure(s): COLONOSCOPY WITH PROPOFOL (N/A) as a surgical intervention .  The patient's history has been reviewed, patient examined, no change in status, stable for surgery.  I have reviewed the patient's chart and labs.  Questions were answered to the patient's satisfaction.     [name]

## 2017-10-01 NOTE — Transfer of Care (Signed)
Immediate Anesthesia Transfer of Care Note  Patient: Ashley [name]  Procedure(s) Performed: COLONOSCOPY WITH PROPOFOL (N/A )  Patient Location: Endoscopy Unit  Anesthesia Type:MAC  Level of Consciousness: drowsy and patient cooperative  Airway & Oxygen Therapy: Patient Spontanous Breathing and Patient connected to face mask oxygen  Post-op Assessment: Report given to RN and Post -op Vital signs reviewed and stable  Post vital signs: Reviewed and stable  Last Vitals:  Vitals:   10/01/17 0745  BP: (!) 190/93  Pulse: 87  Resp: 14  Temp: (!) 36.2 C  SpO2: 99%    Last Pain:  Vitals:   10/01/17 0745  TempSrc: Oral         Complications: No apparent anesthesia complications

## 2017-10-01 NOTE — Anesthesia Preprocedure Evaluation (Signed)
Anesthesia Evaluation  Patient identified by MRN, date of birth, ID band Patient awake    Reviewed: Allergy & Precautions, NPO status , Patient's Chart, lab work & pertinent test results  Airway Mallampati: I  TM Distance: >3 FB Neck ROM: Full    Dental   Pulmonary COPD, Current Smoker,    Pulmonary exam normal        Cardiovascular hypertension, Pt. on medications Normal cardiovascular exam     Neuro/Psych Anxiety Depression Bipolar Disorder CVA    GI/Hepatic GERD  Medicated and Controlled,  Endo/Other  diabetes, Type 2, Insulin Dependent  Renal/GU Renal InsufficiencyRenal disease     Musculoskeletal   Abdominal   Peds  Hematology   Anesthesia Other Findings   Reproductive/Obstetrics                             Anesthesia Physical Anesthesia Plan  ASA: III  Anesthesia Plan: MAC   Post-op Pain Management:    Induction: Intravenous  PONV Risk Score and Plan: 1  Airway Management Planned: Simple Face Mask  Additional Equipment:   Intra-op Plan:   Post-operative Plan:   Informed Consent: I have reviewed the patients History and Physical, chart, labs and discussed the procedure including the risks, benefits and alternatives for the proposed anesthesia with the patient or authorized representative who has indicated his/her understanding and acceptance.     Plan Discussed with: CRNA and Surgeon  Anesthesia Plan Comments:         Anesthesia Quick Evaluation

## 2017-10-01 NOTE — Op Note (Signed)
[facility] Patient Name: Ashley [name] Procedure Date: 10/01/2017 MRN: [id number] Attending MD: [name] , MD Date of Birth: [birth year] CSN: [id number] Age: 72 Admit Type: Outpatient Procedure:                Colonoscopy Indications:              Chronic diarrhea; two subCM adenomas removed 2014                            colonoscopy Providers:                [name], MD, Cleda Daub, RN, Elspeth Cho Tech., Technician, Charolette Child,                            Technician, Adair Laundry, CRNA Referring MD:              Medicines:                Monitored Anesthesia Care Complications:            No immediate complications. Estimated blood loss:                            None. Estimated Blood Loss:     Estimated blood loss: none. Procedure:                Pre-Anesthesia Assessment:                           - Prior to the procedure, a History and Physical                            was performed, and patient medications and                            allergies were reviewed. The patient's tolerance of                            previous anesthesia was also reviewed. The risks                            and benefits of the procedure and the sedation                            options and risks were discussed with the patient.                            All questions were answered, and informed consent                            was obtained. Prior Anticoagulants: The patient has                            taken no previous anticoagulant or antiplatelet  agents. ASA Grade Assessment: II - A patient with                            mild systemic disease. After reviewing the risks                            and benefits, the patient was deemed in                            satisfactory condition to undergo the procedure.                           After obtaining informed consent, the colonoscope                      was passed under direct vision. Throughout the                            procedure, the patient's blood pressure, pulse, and                            oxygen saturations were monitored continuously. The                            Colonoscope was introduced through the anus and                            advanced to the the cecum, identified by                            appendiceal orifice and ileocecal valve. The                            colonoscopy was performed without difficulty. The                            patient tolerated the procedure well. The quality                            of the bowel preparation was good. The ileocecal                            valve, appendiceal orifice, and rectum were                            photographed. Scope In: 8:58:49 AM Scope Out: 9:14:52 AM Scope Withdrawal Time: 0 hours 9 minutes 31 seconds  Total Procedure Duration: 0 hours 16 minutes 3 seconds  Findings:      A 3 mm polyp was found in the ascending colon. The polyp was sessile.       The polyp was removed with a cold snare. Resection and retrieval were       complete.      The exam was otherwise without abnormality on direct and retroflexion       views.  Biopsies for histology were taken with a cold forceps from the entire       colon for evaluation of microscopic colitis. Impression:               - One 3 mm polyp in the ascending colon, removed                            with a cold snare. Resected and retrieved.                           - The examination was otherwise normal on direct                            and retroflexion views.                           - Biopsies were taken with a cold forceps from the                            entire colon for evaluation of microscopic colitis. Moderate Sedation:      N/A- Per Anesthesia Care Recommendation:           - Patient has a contact number available for                            emergencies. The  signs and symptoms of potential                            delayed complications were discussed with the                            patient. Return to normal activities tomorrow.                            Written discharge instructions were provided to the                            patient.                           - Resume previous diet. The daily 150 oz of Diet                            Coke MUST be playing some role in your chronic                            loose stools. Try to cut back on this as best that                            you can.                           - Continue present medications. Please resume daily  scheduled imodium (one pill every morning shortly                            after waking).                           You will receive a letter within 2-3 weeks with the                            pathology results and my final recommendations.                           If the polyp(s) is proven to be 'pre-cancerous' on                            pathology, you will need repeat colonoscopy in 5                            years. If the polyp(s) is NOT 'precancerous' on                            pathology then you should repeat colon cancer                            screening in 10 years with colonoscopy without need                            for colon cancer screening by any method prior to                            then (including stool testing). Procedure Code(s):        --- Professional ---                           [phone], Colonoscopy, flexible; with removal of                            tumor(s), polyp(s), or other lesion(s) by snare                            technique                           53614, 98, Colonoscopy, flexible; with biopsy,                            single or multiple Diagnosis Code(s):        --- Professional ---                           D12.2, Benign neoplasm of ascending colon                           K52.9,  Noninfective gastroenteritis and colitis,  unspecified CPT copyright 2016 American Medical Association. All rights reserved. The codes documented in this report are preliminary and upon coder review may  be revised to meet current compliance requirements. [name], MD 10/01/2017 9:21:50 AM This report has been signed electronically. Number of Addenda: 0

## 2017-10-01 NOTE — Anesthesia Postprocedure Evaluation (Signed)
Anesthesia Post Note  Patient: Ashley [name]  Procedure(s) Performed: COLONOSCOPY WITH PROPOFOL (N/A )     Patient location during evaluation: PACU Anesthesia Type: MAC Level of consciousness: awake and alert Pain management: pain level controlled Vital Signs Assessment: post-procedure vital signs reviewed and stable Respiratory status: spontaneous breathing, nonlabored ventilation, respiratory function stable and patient connected to nasal cannula oxygen Cardiovascular status: stable and blood pressure returned to baseline Postop Assessment: no apparent nausea or vomiting Anesthetic complications: no    Last Vitals:  Vitals:   10/01/17 0940 10/01/17 0950  BP: (!) 194/107 120/86  Pulse: 83 80  Resp: 16 (!) 24  Temp:    SpO2: 100% 100%    Last Pain:  Vitals:   10/01/17 0925  TempSrc: Oral                 Cammeron Greis DAVID

## 2017-10-01 NOTE — Discharge Instructions (Signed)
YOU HAD AN ENDOSCOPIC PROCEDURE TODAY: Refer to the procedure report that was given to you for any specific questions about what was found during the examination.  If the procedure report does not answer your questions, please call your gastroenterologist to clarify.  YOU SHOULD EXPECT: Some feelings of bloating in the abdomen. Passage of more gas than usual.  Walking can help get rid of the air that was put into your GI tract during the procedure and reduce the bloating. If you had a lower endoscopy (such as a colonoscopy or flexible sigmoidoscopy) you may notice spotting of blood in your stool or on the toilet paper.   DIET: Your first meal following the procedure should be a light meal and then it is ok to progress to your normal diet.  A half-sandwich or bowl of soup is an example of a good first meal.  Heavy or fried foods are harder to digest and may make you feel nasueas or bloated.  Drink plenty of fluids but you should avoid alcoholic beverages for 24 hours.  ACTIVITY: Your care partner should take you home directly after the procedure.  You should plan to take it easy, moving slowly for the rest of the day.  You can resume normal activity the day after the procedure however you should NOT DRIVE or use heavy machinery for 24 hours (because of the sedation medicines used during the test).    SYMPTOMS TO REPORT IMMEDIATELY  A gastroenterologist can be reached at any hour.  Please call your doctor's office for any of the following symptoms:   Following lower endoscopy (colonoscopy, flexible sigmoidoscopy)  Excessive amounts of blood in the stool  Significant tenderness, worsening of abdominal pains  Swelling of the abdomen that is new, acute  Fever of 100 or higher   FOLLOW UP: If any biopsies were taken you will be contacted by phone or by letter within the next 1-3 weeks.  Call your gastroenterologist if you have not heard about the biopsies in 3 weeks.  Please also call your  gastroenterologist's office with any specific questions about appointments or follow up tests.

## 2017-10-02 ENCOUNTER — Encounter (HOSPITAL_COMMUNITY): Payer: Self-pay | Admitting: Gastroenterology

## 2017-10-06 ENCOUNTER — Telehealth: Payer: Self-pay | Admitting: *Deleted

## 2017-10-06 NOTE — Telephone Encounter (Signed)
Patient called and complained of increased anxiety since the death of her mother.  She also complained of her body itching all over.  Per Dr Melford Aase, she can takes Zyrtec for the itching. He suggested Atarax for the nerves and the itching, but she can not continue taking Clonazepam 1 mg twice a day.  The patient will contact Dr [name], who prescribes the Clonazepam, in regard to stopping the medication.

## 2017-10-07 ENCOUNTER — Other Ambulatory Visit: Payer: Self-pay | Admitting: *Deleted

## 2017-10-07 DIAGNOSIS — E039 Hypothyroidism, unspecified: Secondary | ICD-10-CM

## 2017-10-07 MED ORDER — LEVOTHYROXINE SODIUM 100 MCG PO TABS: 100.0000 ug | ORAL_TABLET | Freq: Every day | ORAL | 1 refills | Status: DC

## 2017-10-08 ENCOUNTER — Telehealth: Payer: Self-pay | Admitting: *Deleted

## 2017-10-08 NOTE — Telephone Encounter (Signed)
As discussed with the patient on 10/07/2017, the patient contacted Dr [name] regarding changing from Clonazepam to Atarax.  Dr [name] suggested she continue the Clonazepam and add the Zyrtec for itching.

## 2017-10-19 ENCOUNTER — Other Ambulatory Visit: Payer: Self-pay

## 2017-10-19 ENCOUNTER — Other Ambulatory Visit: Payer: Self-pay | Admitting: Internal Medicine

## 2017-10-19 NOTE — Patient Outreach (Signed)
[facility]) Care Management  10/19/2017  Kyiesha Millward Coard February 16, 1946 [id number]   TELEPHONE SCREENING Referral date: 10/08/17 Referral source:  HTA  Referral reason: patient is grieving the loss of her mother. Safety at home Insurance: health team advantage Medicare  Telephone call to patient regarding health team advantage referral. HIPAA verified with patient. Discussed [facility] care management program with patient. Patient states she does not drive and uses and cane for ambulation. Patient states she falls often. Patient states she is unable to complete call at this time because she is getting ready to leave. Request a call back on tomorrow.   PLAN: RNCM will attempt call to patient within 2 business days.   Quinn Plowman RN,BSN,CCM [facility] Telephonic  [phone]

## 2017-10-20 ENCOUNTER — Other Ambulatory Visit: Payer: Self-pay

## 2017-10-20 ENCOUNTER — Ambulatory Visit: Payer: PPO | Admitting: Cardiovascular Disease

## 2017-10-20 ENCOUNTER — Encounter: Payer: Self-pay | Admitting: Cardiovascular Disease

## 2017-10-20 VITALS — BP 144/81 | HR 98 | Ht 63.5 in | Wt 169.6 lb

## 2017-10-20 DIAGNOSIS — I739 Peripheral vascular disease, unspecified: Secondary | ICD-10-CM | POA: Insufficient documentation

## 2017-10-20 DIAGNOSIS — I10 Essential (primary) hypertension: Secondary | ICD-10-CM

## 2017-10-20 DIAGNOSIS — E782 Mixed hyperlipidemia: Secondary | ICD-10-CM

## 2017-10-20 DIAGNOSIS — M7989 Other specified soft tissue disorders: Secondary | ICD-10-CM

## 2017-10-20 NOTE — Assessment & Plan Note (Signed)
History of hyperlipidemia on atorvastatin and fenofibrate followed by her PCP

## 2017-10-20 NOTE — Assessment & Plan Note (Signed)
Complaints of bilateral lower extremity edema right greater than left. On exam she has no edema on the left and 1+ ankle edema on the right.

## 2017-10-20 NOTE — Assessment & Plan Note (Signed)
History of essential hypertension with blood pressure measures 122/84. She is on hydrochlorothiazide and losartan. Continue current meds at current dose

## 2017-10-20 NOTE — Patient Instructions (Signed)
Medication Instructions: Your physician recommends that you continue on your current medications as directed. Please refer to the Current Medication list given to you today.   Follow-Up: Your physician recommends that you schedule a follow-up appointment as needed with Dr. Berry.    

## 2017-10-20 NOTE — Progress Notes (Signed)
10/20/2017 Ashley [name]   Jan 15, 1946  [id number]  Primary Physician Unk Pinto, MD Primary Cardiologist: Lorretta Harp MD Ashley [name], [place]  HPI:  Ashley [name] is a 72 y.o. moderately overweight married (for the third time) Caucasian female mother of no children referred by Dr. Vicente Serene for peripheral vascular evaluation because of abnormal Doppler studies. Risk factors include treated hypertension, diabetes and hyperlipidemia. She smokes 3 packs a day and she has to last 30 years. Her father had a myocardial infarction. She's never had a heart attack but has had a stroke at age 68 which rendered her incapable of working. She has lower extremity edema principally on the right side and symptoms of diabetic peripheral neuropathy. She had Dopplers in our office performed 08/20/17 revealing diminished ABIs principally with tibial vessel disease typical diabetic.    Current Meds  Medication Sig  . allopurinol (ZYLOPRIM) 300 MG tablet Take 1 tablet (300 mg total) by mouth every morning.  Marland Kitchen aspirin EC 81 MG tablet Take 81 mg by mouth daily.  Marland Kitchen atorvastatin (LIPITOR) 40 MG tablet Take 1 tablet (40 mg total) daily by mouth.  . Biotin 1000 MCG tablet Take 1,000 mcg by mouth 3 (three) times daily.  . clonazePAM (KLONOPIN) 1 MG tablet Take 1 mg 2 (two) times daily as needed by mouth for anxiety.   . fenofibrate (TRICOR) 145 MG tablet Take 1 tablet (145 mg total) by mouth daily.  Marland Kitchen FLUoxetine (PROZAC) 40 MG capsule TAKE ONE CAPSULE BY MOUTH EVERY DAY  . gabapentin (NEURONTIN) 100 MG capsule TAKE ONE CAPSULE BY MOUTH EVERY MORNING  . gabapentin (NEURONTIN) 300 MG capsule TAKE ONE CAPSULE BY MOUTH IN THE EVENING  . glipiZIDE (GLUCOTROL XL) 5 MG 24 hr tablet TAKE 1 TABLET BY MOUTH EVERY DAY BEFORE BREAKFAST  . glucose blood (ONE TOUCH ULTRA TEST) test strip TEST BLOOD SUGAR 3 TIMES A DAY (DX E11.9)  . hydrochlorothiazide (HYDRODIURIL) 25 MG tablet TAKE 1 TABLET BY MOUTH EVERY OTHER DAY  .  insulin NPH Human (NOVOLIN N) 100 UNIT/ML injection INJECT 45 UNITS EVERY MORNING AND 15 UNITS EVERY EVENING  . levothyroxine (SYNTHROID, LEVOTHROID) 100 MCG tablet Take 1 tablet (100 mcg total) by mouth daily.  Marland Kitchen losartan (COZAAR) 100 MG tablet TAKE 1 TABLET BY MOUTH EVERY DAY FOR BLOOD PRESSURE AND KIDNEY PROTECTION  . metFORMIN (GLUCOPHAGE XR) 500 MG 24 hr tablet Take 2 tablets 2 x / day for Diabetes  . OVER THE COUNTER MEDICATION Takes 1 MI daily  . phentermine (ADIPEX-P) 37.5 MG tablet TAKE 1 TABLET BY MOUTH EVERY DAY  . ranitidine (ZANTAC) 300 MG tablet TAKE 1 TABLET (300 MG TOTAL) BY MOUTH 2 (TWO) TIMES DAILY.  Marland Kitchen Zinc 50 MG CAPS Take 1 capsule by mouth.     Allergies  Allergen Reactions  . Sulfa Antibiotics Other (See Comments)    Pt states she had "extreme pain"  . Sulfacetamide Sodium     Social History   Socioeconomic History  . Marital status: Married    Spouse name: Not on file  . Number of children: 0  . Years of education: 10  . Highest education level: Not on file  Social Needs  . Financial resource strain: Not on file  . Food insecurity - worry: Not on file  . Food insecurity - inability: Not on file  . Transportation needs - medical: Not on file  . Transportation needs - non-medical: Not on file  Occupational History  .  Occupation: Retired  Tobacco Use  . Smoking status: Current Every Day Smoker    Packs/day: 2.00    Years: 27.00    Pack years: 54.00    Types: Cigarettes  . Smokeless tobacco: Never Used  . Tobacco comment: Information on smoking cessation offered, pt refused information at this time  Substance and Sexual Activity  . Alcohol use: No    Alcohol/week: 0.0 oz  . Drug use: No  . Sexual activity: Yes    Partners: Male    Comment: Married  Other Topics Concern  . Not on file  Social History Narrative   Lives at home w/ her husband   Right-handed   Caffeine: 6 diet Cokes per day     Review of Systems: General: negative for chills,  fever, night sweats or weight changes.  Cardiovascular: negative for chest pain, dyspnea on exertion, edema, orthopnea, palpitations, paroxysmal nocturnal dyspnea or shortness of breath Dermatological: negative for rash Respiratory: negative for cough or wheezing Urologic: negative for hematuria Abdominal: negative for nausea, vomiting, diarrhea, bright red blood per rectum, melena, or hematemesis Neurologic: negative for visual changes, syncope, or dizziness All other systems reviewed and are otherwise negative except as noted above.    Blood pressure (!) 144/81, pulse 98, height 5' 3.5" (1.613 m), weight 169 lb 9.6 oz (76.9 kg).  General appearance: alert and no distress Neck: no adenopathy, no carotid bruit, no JVD, supple, symmetrical, trachea midline and thyroid not enlarged, symmetric, no tenderness/mass/nodules Lungs: clear to auscultation bilaterally Heart: regular rate and rhythm, S1, S2 normal, no murmur, click, rub or gallop Extremities: 1+ right ankle edema Pulses: Diminished but palpable pedal pulses bilaterally Skin: Skin color, texture, turgor normal. No rashes or lesions Neurologic: Alert and oriented X 3, normal strength and tone. Normal symmetric reflexes. Normal coordination and gait  EKG sinus rhythm at 98 without ST or T-wave changes. I personally reviewed this EKG.  ASSESSMENT AND PLAN:   Hyperlipidemia History of hyperlipidemia on atorvastatin and fenofibrate followed by her PCP  Essential hypertension History of essential hypertension with blood pressure measures 122/84. She is on hydrochlorothiazide and losartan. Continue current meds at current dose  Leg swelling Complaints of bilateral lower extremity edema right greater than left. On exam she has no edema on the left and 1+ ankle edema on the right.  Peripheral arterial disease ([place]) History of PAD with recent Dopplers performed 08/20/17 revealing ABIs in the 0.60 - 0.8 range bilaterally principally  involving the tibial vessels. 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      Lorretta Harp MD FACP,FACC,FAHA, [facility] 10/20/2017 3:29 PM

## 2017-10-20 NOTE — Patient Outreach (Signed)
[facility]) Care Management  10/20/2017  Merie Wulf Buddenhagen Feb 09, 1946 [id number]  TELEPHONE SCREENING Referral date: 10/08/17 Referral source:  HTA  Referral reason: patient is grieving the loss of her mother. Safety at home Insurance: health team advantage Medicare  Telephone call to patient regarding primary MD referral. HIPAA verified.  Discussed and offered [facility] care management services to patient.   Patient states she has not driven in the past 2 years.  Patient states her mother recently passed away. States her mother would drive her to her appointments.  Patient states her husband has to take off of his job now to take her to the doctors.  RNCM offered [facility] social work services for transportation assistance. Patient verbally agreed. Patient states she is still dealing with the loss of her mother. RNCM offered social work follow up to discuss grief  Counseling. Patient states she has a psychiatrist and psychologist that she sees regularly and will follow up with them regarding grief counseling. Patient reports her next follow up with her psychiatrist in 12/13/17. Patient reports she has had several falls.  Patient states her legs are weak and she gets dizzy at times. Patient states uses a walker and has to have someone with her because she has a tendency to fall.  Patient denies having any severe injury from the falls. States only bumps and bruises. Patient reports she has an appointment today with the vascular doctor for her legs. Patient states she was told that the blood doesn't flow well in her legs.   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  Reports her last A1C was 8.  Patient states her fasting blood sugars run between the 120's and 200's.  Patient states does not like sweets as much as she likes to eat bread, pasta's, and potato's.  Patient states she is a smoker. Patient states, "I know I need to stop but I just love it so much and I'm not ready to stop at this  point."  RNCM offered nurse case management follow up for disease management and education. Patient refused services. Patient states she knows what to do and what she should be doing.  Patient states the only services she is interested in right now is transportation assistance.   PLAN:  RNCM will refer patient to social worker for transportation assistance.   Quinn Plowman RN,BSN,CCM [facility] Telephonic  [phone]    :

## 2017-10-20 NOTE — Assessment & Plan Note (Signed)
History of PAD with recent Dopplers performed 08/20/17 revealing ABIs in the 0.60 - 0.8 range bilaterally principally involving the tibial vessels. 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

## 2017-10-21 ENCOUNTER — Encounter: Payer: Self-pay | Admitting: *Deleted

## 2017-10-21 ENCOUNTER — Other Ambulatory Visit: Payer: Self-pay | Admitting: *Deleted

## 2017-10-21 NOTE — Patient Outreach (Signed)
[facility]) Care Management  10/21/2017  Ashley [name] 03-03-1946 [id number]   CSW was able to make initial contact with patient today to perform phone assessment, as well as assess and assist with social work needs and services.  CSW introduced self, explained role and types of services provided through [place] [employer] ([place] [employer]).  CSW further explained to patient that CSW works with patient's Telephonic RNCM, also with [place] [employer], Quinn Plowman. CSW then explained the reason for the call, indicating that Mrs. Nyoka Cowden thought that patient would benefit from social work services and resources to assist with arranging transportation to and from physician appointments.  CSW obtained two HIPAA compliant identifiers from patient, which included patient's name and date of birth. Patient reported that she is interested in applying for SCAT Paramedic) through [employer] wanting to know if CSW could assist her with this process.  CSW agreed to complete the SCAT application for patient and submit directly to [facility] for processing.  CSW explained to patient that it can take up to two weeks for patient to receive a call from a representative with SCAT regarding application submission.  Patient is prepared to have the representative with SCAT perform a phone assessment to determine eligibility. CSW also spoke with patient about transportation assistance through [employer] with [employer] [place].  CSW agreed to contact a representative with [employer] to get patient established in their date base by providing basic demographic information.  Patient is aware that she will need to contact [employer] at least one week in advance to arrange for transportation services.  CSW agreed to follow-up with patient in two weeks to ensure that she has received a call from a representative with  SCAT regarding eligibility.  No additional social work needs have been identified at this time. THN CM Care Plan Problem One     Most Recent Value  Care Plan Problem One  Lack of transportation to and from physician appointments.  Role Documenting the Problem One  Clinical Social Worker  Care Plan for Problem One  Active  [facility] Long Term Goal   Eligibility will be determined for patient with regards to transportation assistance through SCAT, within the next 40 days.  THN Long Term Goal Start Date  10/21/17  Interventions for Problem One Long Term Goal  CSW will complete a SCAT application for patient and submit to [facility] for processing.  THN CM Short Term Goal #1   Patient will contact a representative with SCAT when she needs transportation to and from her physician appointment, within the next 30 days.  THN CM Short Term Goal #1 Start Date  10/21/17  Interventions for Short Term Goal #1  Patient has been given the contact number for SCAT.  THN CM Short Term Goal #2   Patient will contact a representative with [employer] when she needs transportation to and from her physician appointment, within the next 30 days.  THN CM Short Term Goal #2 Start Date  10/21/17  Interventions for Short Term Goal #2  Patient has been given the contact number for [employer].     [name], BSW, MSW, LCSW  Licensed Education officer, environmental Health System  Mailing [place] [address] [place], [place] [address] Physical Address-[address], [place], [place] [address] Toll Free Main # [phone] Fax # [phone] Cell # [phone]  Office #  [email or web address]

## 2017-10-28 ENCOUNTER — Other Ambulatory Visit: Payer: Self-pay | Admitting: Internal Medicine

## 2017-10-29 NOTE — Telephone Encounter (Signed)
This has been resolved

## 2017-10-29 NOTE — Telephone Encounter (Signed)
She was lost for f/u. Refills per PCP.

## 2017-10-29 NOTE — Telephone Encounter (Signed)
Last ov 06/24/16 2 canceled and no future apt scheduled refill or refuse please advise

## 2017-11-03 ENCOUNTER — Encounter: Payer: Self-pay | Admitting: *Deleted

## 2017-11-03 ENCOUNTER — Other Ambulatory Visit: Payer: Self-pay | Admitting: *Deleted

## 2017-11-03 NOTE — Patient Outreach (Signed)
Ashley [name]) Care Management  11/03/2017  Ashley [name] 04/28/1946 [id number]   CSW was able to make contact with patient today to follow-up regarding transportation services, as well as to ensure that patient has received a call from a representative with SCAT Paramedic) through [employer] ([employer]) regarding recent Chief Operating Officer.  Patient admitted that she has already performed her telephone assessment and that she will have her face-to-face interview on Thursday, November 05, 2017 at 3:00PM.  Patient reported that she was told that she will be approved and be able to begin utilizing transportation services within the next week.  Patient's husband has made arrangements to take off of work on Thursday to transport patient to and from her appointment with SCAT.  Patient was most appreciative of CSW's assistance. CSW will perform a case closure on patient, as all goals of treatment have been met from social work standpoint and no additional social work needs have been identified at this time.  CSW will fax an update to patient's Primary Care Physician, Dr. Unk Pinto to ensure that they are aware of CSW's involvement with patient's plan of care.  CSW will submit a case closure request to [name], Care Management Assistant with [place] [employer], in the form of an [employer].   THN CM Care Plan Problem One     Most Recent Value  Care Plan Problem One  Lack of transportation to and from physician appointments.  Role Documenting the Problem One  Clinical Social Worker  Care Plan for Problem One  Active  [facility] Long Term Goal   Eligibility will be determined for patient with regards to transportation assistance through SCAT, within the next 40 days.  THN Long Term Goal Start Date  10/21/17  THN Long Term Goal Met Date  11/03/17  Interventions for Problem One Long Term Goal  Patient has an interview  with a representative from [employer] on 11/05/2017 at 3:00PM.  THN CM Short Term Goal #1   Patient will contact a representative with SCAT when she needs transportation to and from her physician appointment, within the next 30 days.  THN CM Short Term Goal #1 Start Date  10/21/17  [facility] CM Short Term Goal #1 Met Date  11/03/17  Interventions for Short Term Goal #1  Transportation arrangements will be made for patient through SCAT moving forward.  THN CM Short Term Goal #2   Patient will contact a representative with [employer] when she needs transportation to and from her physician appointment, within the next 30 days.  THN CM Short Term Goal #2 Start Date  10/21/17  [facility] CM Short Term Goal #2 Met Date  11/03/17  Interventions for Short Term Goal #2  Patient will utilize [employer] when [employer] is unavailable to transport her to and from her physician appointments.    [name], BSW, MSW, LCSW  Licensed Education officer, environmental Health System  Mailing [place] [address] [place], [place] [address] Physical Address-[address], [place], [place] [address] Toll Free Main # [phone] Fax # [phone] Cell # [phone]  Office # [phone] [email or web address]

## 2017-11-13 ENCOUNTER — Other Ambulatory Visit: Payer: Self-pay | Admitting: Internal Medicine

## 2017-11-16 ENCOUNTER — Other Ambulatory Visit: Payer: Self-pay | Admitting: Internal Medicine

## 2017-11-16 NOTE — Telephone Encounter (Signed)
Last ov 06/24/16 2 cancelled and no future scheduled refill or refuse please advise

## 2017-11-21 ENCOUNTER — Other Ambulatory Visit: Payer: Self-pay | Admitting: Physician Assistant

## 2017-11-25 ENCOUNTER — Ambulatory Visit (INDEPENDENT_AMBULATORY_CARE_PROVIDER_SITE_OTHER): Payer: PPO | Admitting: Internal Medicine

## 2017-11-25 ENCOUNTER — Encounter: Payer: Self-pay | Admitting: Internal Medicine

## 2017-11-25 VITALS — BP 124/70 | HR 76 | Temp 97.2°F | Resp 16 | Ht 63.5 in | Wt 172.6 lb

## 2017-11-25 DIAGNOSIS — J301 Allergic rhinitis due to pollen: Secondary | ICD-10-CM

## 2017-11-25 DIAGNOSIS — L232 Allergic contact dermatitis due to cosmetics: Secondary | ICD-10-CM | POA: Diagnosis not present

## 2017-11-25 DIAGNOSIS — M109 Gout, unspecified: Secondary | ICD-10-CM | POA: Diagnosis not present

## 2017-11-25 MED ORDER — IPRATROPIUM BROMIDE 0.06 % NA SOLN: NASAL | 11 refills | Status: DC

## 2017-11-25 MED ORDER — ALLOPURINOL 300 MG PO TABS: 300.0000 mg | ORAL_TABLET | Freq: Every morning | ORAL | 1 refills | Status: DC

## 2017-11-25 MED ORDER — PREDNISONE 10 MG PO TABS: ORAL_TABLET | ORAL | 0 refills | Status: DC

## 2017-11-25 NOTE — Patient Instructions (Signed)

## 2017-11-25 NOTE — Progress Notes (Signed)
Subjective:    Patient ID: Ashley [name], female    DOB: Sep 04, 1945, 72 y.o.   MRN: [id number]  HPI  This 72 yo MWF with HTN, HLD, Ins Req T2_DM BiPolar MD Disorder presents w/complaints of facial swelling and itching after applying some new type of cosmetic lotion to her face last night. She also c/o ongoing problems w/rhinorrhea. She requests refill for her Allopurinol as she apparently lost her Rx.  Medication Sig  . aspirin EC 81 MG tablet Take 81 mg by mouth daily.  Marland Kitchen atorvastatin (LIPITOR) 40 MG tablet Take 1 tablet (40 mg total) daily by mouth.  . clonazePAM (KLONOPIN) 1 MG tablet Take 1 mg 2 (two) times daily as needed by mouth for anxiety.   . fenofibrate (TRICOR) 145 MG tablet TAKE 1 TABLET BY MOUTH EVERY DAY  . FLUoxetine (PROZAC) 40 MG capsule TAKE ONE CAPSULE BY MOUTH EVERY DAY  . gabapentin (NEURONTIN) 100 MG capsule TAKE 1 CAPSULE BY MOUTH EVERY DAY IN THE MORNING  . gabapentin (NEURONTIN) 300 MG capsule TAKE ONE CAPSULE BY MOUTH IN THE EVENING  . glipiZIDE (GLUCOTROL XL) 5 MG 24 hr tablet TAKE 1 TABLET BY MOUTH EVERY DAY BEFORE BREAKFAST  . glucose blood (ONE TOUCH ULTRA TEST) test strip TEST BLOOD SUGAR 3 TIMES A DAY (DX E11.9)  . hydrochlorothiazide (HYDRODIURIL) 25 MG tablet TAKE 1 TABLET BY MOUTH EVERY OTHER DAY  . insulin NPH Human (NOVOLIN N) 100 UNIT/ML injection INJECT 45 UNITS EVERY MORNING AND 15 UNITS EVERY EVENING  . levothyroxine (SYNTHROID, LEVOTHROID) 100 MCG tablet Take 1 tablet (100 mcg total) by mouth daily.  Marland Kitchen losartan (COZAAR) 100 MG tablet TAKE 1 TABLET BY MOUTH EVERY DAY FOR BLOOD PRESSURE AND KIDNEY PROTECTION  . metFORMIN (GLUCOPHAGE XR) 500 MG 24 hr tablet Take 2 tablets 2 x / day for Diabetes  . OVER THE COUNTER MEDICATION Takes 1 MI daily  . phentermine (ADIPEX-P) 37.5 MG tablet TAKE 1 TABLET BY MOUTH EVERY DAY  . ranitidine (ZANTAC) 300 MG tablet TAKE 1 TABLET (300 MG TOTAL) BY MOUTH 2 (TWO) TIMES DAILY.  Marland Kitchen allopurinol (ZYLOPRIM) 300 MG tablet Take 1  tablet (300 mg total) by mouth every morning.  . Zinc 50 MG CAPS Take 1 capsule by mouth.  . Biotin 1000 MCG tablet Take 1,000 mcg by mouth 3 (three) times daily.   No facility-administered medications prior to visit.    Allergies  Allergen Reactions  . Sulfa Antibiotics Other (See Comments)    Pt states she had "extreme pain"  . Sulfacetamide Sodium    Past Medical History:  Diagnosis Date  . Anxiety disorder   . Arthritis LOWER BACK  . Asthma   . Bipolar 1 disorder ([place])   . Carpal tunnel syndrome   . Chronic kidney disease    stage 3  . Depression   . Diabetes mellitus    type 2  . Dyslipidemia   . Gait disorder   . GERD (gastroesophageal reflux disease)   . Gout LEFT FOOT-  STABLE  . H/O hiatal hernia   . History of CVA (cerebrovascular accident) FOUND PER MRI 1994--  RESIDUAL MEMORY IMPAIRED  . Hypercholesteremia   . Hypertension   . Memory difficulty 08/14/2016  . Neuropathy of lower extremity   . OCD (obsessive compulsive disorder)   . Peripheral neuropathy   . SOB (shortness of breath) on exertion   . Stroke ([place])    2000 memory loss   . SUI (stress urinary incontinence,  female)   . Vulva cancer ([place]) 07/07/2012  . Vulvar lesion    Review of Systems  10 point systems review negative except as above.    Objective:   Physical Exam  BP 124/70   Pulse 76   Temp (!) 97.2 F (36.2 C)   Resp 16   Ht 5' 3.5" (1.613 m)   Wt 172 lb 9.6 oz (78.3 kg)   BMI 30.10 kg/m   HEENT - WNL. Neck - supple.  Chest - Clear equal BS. Cor - Nl HS. RRR w/o sig MGR. PP 1(+). No edema. MS- FROM w/o deformities.  Gait Nl. Neuro -  Nl w/o focal abnormalities. Skin -  of face appears sl puffy especially around the orbits with mild blepharedema. No vesicles or rash is seen.     Assessment & Plan:   1. Allergic contact dermatitis due to cosmetics  - predniSONE (DELTASONE) 10 MG tablet; 1 tab 3 x day for 2 days, then 1 tab 2 x day for 2 days, then 1 tab 1 x day for 3 days   Dispense: 13 tablet; Refill: 0  2. Allergic rhinitis due to pollen, unspecified seasonality  - ipratropium (ATROVENT) 0.06 % nasal spray; Use 1 to 2 sprays each nostril 2 to 3 x / day as needed  Dispense: 15 mL; Refill: 11  3. Gout  - allopurinol (ZYLOPRIM) 300 MG tablet; Take 1 tablet (300 mg total) by mouth every morning.  Dispense: 90 tablet; Refill: 1

## 2017-12-07 ENCOUNTER — Telehealth: Payer: Self-pay | Admitting: Internal Medicine

## 2017-12-07 NOTE — Telephone Encounter (Signed)
[phone]. Fax# [phone]

## 2017-12-07 NOTE — Telephone Encounter (Signed)
Informed pharmacy that pt needs appt for further refills, stated that they will relay message to pt

## 2017-12-10 ENCOUNTER — Other Ambulatory Visit: Payer: Self-pay | Admitting: *Deleted

## 2017-12-10 ENCOUNTER — Telehealth: Payer: Self-pay | Admitting: *Deleted

## 2017-12-10 MED ORDER — GLIPIZIDE ER 5 MG PO TB24: ORAL_TABLET | ORAL | 0 refills | Status: DC

## 2017-12-10 NOTE — Telephone Encounter (Signed)
He patient called with concerns. She asked what to do about the edema in her right foot. The patient was advised to elevate her feet, increase her HCTZ 25 mg to daily for now and she will need an OV if it does not resolve. The patient was advised the RX for Prednisone was a short term RX for her face swelling and itching and no refill needed. She was advised to call her eye doctor for the floaters in her eyes. Her Allopurinol should be taken in the Am.  Dr Melford Aase is aware of all.

## 2017-12-14 ENCOUNTER — Ambulatory Visit: Payer: PPO | Admitting: Gastroenterology

## 2017-12-16 NOTE — Progress Notes (Deleted)
FOLLOW UP  Assessment and Plan:   Hypertension Well controlled with current medications  Monitor blood pressure at home; patient to call if consistently greater than 130/80 Continue DASH diet.   Reminder to go to the ER if any CP, SOB, nausea, dizziness, severe HA, changes vision/speech, left arm numbness and tingling and jaw pain.  Cholesterol Currently at goal;  Continue low cholesterol diet and exercise.  Check lipid panel.   Diabetes with diabetic chronic kidney disease, with diabetic peripheral angiopathy without gangrene and with other diabetic opthalmic complication Continue medications Continue diet and exercise.  Perform daily foot/skin check, notify office of any concerning changes.  Check A1C  Obesity with co morbidities Long discussion about weight loss, diet, and exercise Recommended diet heavy in fruits and veggies and low in animal meats, cheeses, and dairy products, appropriate calorie intake Discussed ideal weight for height  and initial weight goal (***) Patient is prescribed phentermine without SE Patient will work on *** Will follow up in 3 months  Vitamin D Def At goal at last visit; continue supplementation to maintain goal of 70-100 Defer Vit D level  Hypothyroidism continue medications the same pending lab results reminded to take on an empty stomach 30-26mins before food.  check TSH level  Gout Continue allopurinol Diet discussed Check uric acid as needed   Continue diet and meds as discussed. Further disposition pending results of labs. Discussed med's effects and SE's.   Over 30 minutes of exam, counseling, chart review, and critical decision making was performed.   Future Appointments  Date Time Provider [place]  12/17/2017  4:00 PM Liane Comber, NP [facility] None  02/23/2018  1:45 PM [name], MD [facility]  03/30/2018  3:45 PM Unk Pinto, MD [facility] None     ----------------------------------------------------------------------------------------------------------------------  HPI 72 y.o. female  presents for 3 month follow up on hypertension, cholesterol, diabetes, obesity, hypothyroid and vitamin D deficiency. Patient also is followed by Dr [name] for Bipolar manic-depressive disorder with anxiety. Patient also has hx/o Gout apparently controlled on Allopurinol.    BMI is There is no height or weight on file to calculate BMI., she {HAS HAS Ashley [name]:34193} been working on diet and exercise. Wt Readings from Last 3 Encounters:  11/25/17 172 lb 9.6 oz (78.3 kg)  10/20/17 169 lb 9.6 oz (76.9 kg)  10/01/17 169 lb 3.2 oz (76.7 kg)    Her blood pressure {HAS HAS NOT:18834} been controlled at home, today their BP is    She {DOES_DOES XTK:24097} workout. She denies chest pain, shortness of breath, dizziness.   She is on cholesterol medication (atorvastatin 40 mg ***) and denies myalgias. Her cholesterol {ACTION; IS/IS NOT:21021397} at goal. The cholesterol last visit was:   Lab Results  Component Value Date   CHOL 174 09/10/2017   HDL 51 09/10/2017   LDLCALC 94 09/10/2017   TRIG 195 (H) 09/10/2017   CHOLHDL 3.4 09/10/2017    She {Has/has not:18111} been working on diet and exercise for T2 diabetes with complications, and denies {Symptoms; diabetes w/o none:19199}. Last A1C in the office was:  Lab Results  Component Value Date   HGBA1C 8.0 (H) 09/10/2017   She is on thyroid medication. Her medication was not changed last visit.   Lab Results  Component Value Date   TSH 1.68 09/10/2017  . Patient is on Vitamin D supplement.   Lab Results  Component Value Date   VD25OH 26 (L) 09/10/2017     Patient is on allopurinol for  gout and {Action; does/does not:19097} report a recent flare.  Lab Results  Component Value Date   LABURIC 3.1 09/10/2017      Current Medications:  Current Outpatient Medications on File Prior to Visit  Medication  Sig  . allopurinol (ZYLOPRIM) 300 MG tablet Take 1 tablet (300 mg total) by mouth every morning.  Marland Kitchen aspirin EC 81 MG tablet Take 81 mg by mouth daily.  Marland Kitchen atorvastatin (LIPITOR) 40 MG tablet Take 1 tablet (40 mg total) daily by mouth.  . clonazePAM (KLONOPIN) 1 MG tablet Take 1 mg 2 (two) times daily as needed by mouth for anxiety.   . fenofibrate (TRICOR) 145 MG tablet TAKE 1 TABLET BY MOUTH EVERY DAY  . FLUoxetine (PROZAC) 40 MG capsule TAKE ONE CAPSULE BY MOUTH EVERY DAY  . gabapentin (NEURONTIN) 100 MG capsule TAKE 1 CAPSULE BY MOUTH EVERY DAY IN THE MORNING  . gabapentin (NEURONTIN) 300 MG capsule TAKE ONE CAPSULE BY MOUTH IN THE EVENING  . glipiZIDE (GLUCOTROL XL) 5 MG 24 hr tablet TAKE 1 TABLET BY MOUTH EVERY DAY BEFORE BREAKFAST  . glucose blood (ONE TOUCH ULTRA TEST) test strip TEST BLOOD SUGAR 3 TIMES A DAY (DX E11.9)  . hydrochlorothiazide (HYDRODIURIL) 25 MG tablet TAKE 1 TABLET BY MOUTH EVERY OTHER DAY  . insulin NPH Human (NOVOLIN N) 100 UNIT/ML injection INJECT 45 UNITS EVERY MORNING AND 15 UNITS EVERY EVENING  . ipratropium (ATROVENT) 0.06 % nasal spray Use 1 to 2 sprays each nostril 2 to 3 x / day as needed  . levothyroxine (SYNTHROID, LEVOTHROID) 100 MCG tablet Take 1 tablet (100 mcg total) by mouth daily.  Marland Kitchen losartan (COZAAR) 100 MG tablet TAKE 1 TABLET BY MOUTH EVERY DAY FOR BLOOD PRESSURE AND KIDNEY PROTECTION  . metFORMIN (GLUCOPHAGE XR) 500 MG 24 hr tablet Take 2 tablets 2 x / day for Diabetes  . OVER THE COUNTER MEDICATION Takes 1 MI daily  . phentermine (ADIPEX-P) 37.5 MG tablet TAKE 1 TABLET BY MOUTH EVERY DAY  . predniSONE (DELTASONE) 10 MG tablet 1 tab 3 x day for 2 days, then 1 tab 2 x day for 2 days, then 1 tab 1 x day for 3 days  . ranitidine (ZANTAC) 300 MG tablet TAKE 1 TABLET (300 MG TOTAL) BY MOUTH 2 (TWO) TIMES DAILY.  Marland Kitchen Zinc 50 MG CAPS Take 1 capsule by mouth.   No current facility-administered medications on file prior to visit.      Allergies:   Allergies  Allergen Reactions  . Sulfa Antibiotics Other (See Comments)    Pt states she had "extreme pain"  . Sulfacetamide Sodium      Medical History:  Past Medical History:  Diagnosis Date  . Anxiety disorder   . Arthritis LOWER BACK  . Asthma   . Bipolar 1 disorder ([place])   . Carpal tunnel syndrome   . Chronic kidney disease    stage 3  . Depression   . Diabetes mellitus    type 2  . Dyslipidemia   . Gait disorder   . GERD (gastroesophageal reflux disease)   . Gout LEFT FOOT-  STABLE  . H/O hiatal hernia   . History of CVA (cerebrovascular accident) FOUND PER MRI 1994--  RESIDUAL MEMORY IMPAIRED  . Hypercholesteremia   . Hypertension   . Memory difficulty 08/14/2016  . Neuropathy of lower extremity   . OCD (obsessive compulsive disorder)   . Peripheral neuropathy   . SOB (shortness of breath) on exertion   .  Stroke ([place])    2000 memory loss   . SUI (stress urinary incontinence, female)   . Vulva cancer ([place]) 07/07/2012  . Vulvar lesion    Family history- Reviewed and unchanged Social history- Reviewed and unchanged   Review of Systems:  Review of Systems  Constitutional: Negative for malaise/fatigue and weight loss.  HENT: Negative for hearing loss and tinnitus.   Eyes: Negative for blurred vision and double vision.  Respiratory: Negative for cough, shortness of breath and wheezing.   Cardiovascular: Negative for chest pain, palpitations, orthopnea, claudication and leg swelling.  Gastrointestinal: Negative for abdominal pain, blood in stool, constipation, diarrhea, heartburn, melena, nausea and vomiting.  Genitourinary: Negative.   Musculoskeletal: Negative for joint pain and myalgias.  Skin: Negative for rash.  Neurological: Negative for dizziness, tingling, sensory change, weakness and headaches.  Endo/Heme/Allergies: Negative for polydipsia.  Psychiatric/Behavioral: Negative.   All other systems reviewed and are negative.     Physical  Exam: There were no vitals taken for this visit. Wt Readings from Last 3 Encounters:  11/25/17 172 lb 9.6 oz (78.3 kg)  10/20/17 169 lb 9.6 oz (76.9 kg)  10/01/17 169 lb 3.2 oz (76.7 kg)   General Appearance: Well nourished, in no apparent distress. Eyes: PERRLA, EOMs, conjunctiva no swelling or erythema Sinuses: No Frontal/maxillary tenderness ENT/Mouth: Ext aud canals clear, TMs without erythema, bulging. No erythema, swelling, or exudate on post pharynx.  Tonsils not swollen or erythematous. Hearing normal.  Neck: Supple, thyroid normal.  Respiratory: Respiratory effort normal, BS equal bilaterally without rales, rhonchi, wheezing or stridor.  Cardio: RRR with no MRGs. Brisk peripheral pulses without edema.  Abdomen: Soft, + BS.  Non tender, no guarding, rebound, hernias, masses. Lymphatics: Non tender without lymphadenopathy.  Musculoskeletal: Full ROM, 5/5 strength, {PSY - GAIT AND STATION:22860} gait Skin: Warm, dry without rashes, lesions, ecchymosis.  Neuro: Cranial nerves intact. No cerebellar symptoms.  Psych: Awake and oriented X 3, normal affect, Insight and Judgment appropriate.    Izora Ribas, NP 1:20 PM [facility]

## 2017-12-17 ENCOUNTER — Ambulatory Visit: Payer: Self-pay | Admitting: Adult Health

## 2017-12-17 DIAGNOSIS — IMO0002 Reserved for concepts with insufficient information to code with codable children: Secondary | ICD-10-CM | POA: Insufficient documentation

## 2017-12-17 DIAGNOSIS — E1165 Type 2 diabetes mellitus with hyperglycemia: Secondary | ICD-10-CM | POA: Insufficient documentation

## 2017-12-22 ENCOUNTER — Other Ambulatory Visit: Payer: Self-pay | Admitting: Internal Medicine

## 2017-12-29 DIAGNOSIS — H25043 Posterior subcapsular polar age-related cataract, bilateral: Secondary | ICD-10-CM | POA: Diagnosis not present

## 2017-12-29 DIAGNOSIS — H2513 Age-related nuclear cataract, bilateral: Secondary | ICD-10-CM | POA: Diagnosis not present

## 2017-12-29 DIAGNOSIS — H25013 Cortical age-related cataract, bilateral: Secondary | ICD-10-CM | POA: Diagnosis not present

## 2017-12-29 DIAGNOSIS — E113393 Type 2 diabetes mellitus with moderate nonproliferative diabetic retinopathy without macular edema, bilateral: Secondary | ICD-10-CM | POA: Diagnosis not present

## 2017-12-29 LAB — HM DIABETES EYE EXAM

## 2017-12-31 ENCOUNTER — Other Ambulatory Visit: Payer: Self-pay | Admitting: Internal Medicine

## 2017-12-31 DIAGNOSIS — E119 Type 2 diabetes mellitus without complications: Secondary | ICD-10-CM

## 2017-12-31 DIAGNOSIS — Z794 Long term (current) use of insulin: Principal | ICD-10-CM

## 2018-01-04 ENCOUNTER — Encounter: Payer: Self-pay | Admitting: *Deleted

## 2018-01-04 ENCOUNTER — Other Ambulatory Visit: Payer: Self-pay | Admitting: Internal Medicine

## 2018-01-05 DIAGNOSIS — H2513 Age-related nuclear cataract, bilateral: Secondary | ICD-10-CM | POA: Diagnosis not present

## 2018-01-05 DIAGNOSIS — E113393 Type 2 diabetes mellitus with moderate nonproliferative diabetic retinopathy without macular edema, bilateral: Secondary | ICD-10-CM | POA: Diagnosis not present

## 2018-01-05 DIAGNOSIS — H2511 Age-related nuclear cataract, right eye: Secondary | ICD-10-CM | POA: Diagnosis not present

## 2018-01-05 DIAGNOSIS — H25043 Posterior subcapsular polar age-related cataract, bilateral: Secondary | ICD-10-CM | POA: Diagnosis not present

## 2018-01-05 DIAGNOSIS — H25013 Cortical age-related cataract, bilateral: Secondary | ICD-10-CM | POA: Diagnosis not present

## 2018-01-05 LAB — HM MAMMOGRAPHY

## 2018-01-06 DIAGNOSIS — E669 Obesity, unspecified: Secondary | ICD-10-CM | POA: Insufficient documentation

## 2018-01-06 NOTE — Progress Notes (Signed)
FOLLOW UP  Assessment and Plan:   Hypertension Well controlled with current medications  Monitor blood pressure at home; patient to call if consistently greater than 130/80 Continue DASH diet.   Reminder to go to the ER if any CP, SOB, nausea, dizziness, severe HA, changes vision/speech, left arm numbness and tingling and jaw pain.  Cholesterol Currently near LDL goal; continue statin; diet emphasized for triglycerides - add fenofibrate if remains elevated Continue low cholesterol diet and exercise.  Check lipid panel.   Diabetes with diabetic chronic kidney disease and with other circulatory complications Very poorly complaint with diet Continue medications; metformin 500 mg 4 tabs daily, NPH 35 units AM, 25 units PM  -  Perform daily foot/skin check, notify office of any concerning changes.  Check A1C  Obesity with co morbidities Long discussion about weight loss, diet, and exercise Recommended diet heavy in fruits and veggies and low in animal meats, cheeses, and dairy products, appropriate calorie intake Currently prescribed phentermine- reminded to take drug holidays - discussed medication mechanism and emphasized need for lifestyle change for benefit. Advised to stop taking for a month and only restart if she plans on making dietary change.  Will follow up in 3 months  Hypothyroidism continue medications the same pending lab results reminded to take on an empty stomach 30-48mins before food.  check TSH level  Gout Continue allopurinol Diet discussed Check uric acid as needed  Depression/anxiety Continue medications; Follow up with Dr. Wylene Simmer; reminded to work on avoiding scheduled dosing of benzo, take smallest amount only as needed.  Lifestyle discussed: diet/exerise, sleep hygiene, stress management, hydration   Continue diet and meds as discussed. Further disposition pending results of labs. Discussed med's effects and SE's.   Over 30 minutes of exam, counseling,  chart review, and critical decision making was performed.   Future Appointments  Date Time Provider [place]  02/23/2018  1:45 PM [name], MD [facility]  03/30/2018  3:45 PM Unk Pinto, MD [facility] None    ----------------------------------------------------------------------------------------------------------------------  HPI 72 y.o. female  presents for 3 month follow up on hypertension, cholesterol, diabetes, hypothyroid, obestiy, and depression/anxiety. She has dx of bipolar depression and is followed by Dr. [name]. She is followed by Dr. Cruzita Lederer for Poorly controlled T2DM. She has cataract surgery scheduled for next week.   she has a diagnosis of depression/anxiety and is currently on prozac 40 mg daily, klonopin 1 mg BID PRN, reports symptoms are well controlled on current regimen. she does currently take BID regularly after her mother reportedly passed away 3 months ago.   she is prescribed phentermine for weight loss.  While on the medication they have lost 0 lbs since last visit. They deny palpitations, anxiety, trouble sleeping, elevated BP.   BMI is Body mass index is 30.34 kg/m., she is admittedly very poorly compliant with diet/exercise.  Wt Readings from Last 3 Encounters:  01/07/18 174 lb (78.9 kg)  11/25/17 172 lb 9.6 oz (78.3 kg)  10/20/17 169 lb 9.6 oz (76.9 kg)   Her blood pressure has been controlled at home, today their BP is BP: 136/82  She does not workout. She denies chest pain, shortness of breath, dizziness.   She is on cholesterol medication and denies myalgias. Her cholesterol is at goal. The cholesterol last visit was:   Lab Results  Component Value Date   CHOL 174 09/10/2017   HDL 51 09/10/2017   LDLCALC 94 09/10/2017   TRIG 195 (H) 09/10/2017   CHOLHDL  3.4 09/10/2017   She is followed by Dr. Cruzita Lederer for T2DM; she is admittedly very poorly compliant with lifestyle recommendations She has not been working on diet and  exercise, admits to eating fast food for all 3 meals and denies foot ulcerations, increased appetite, nausea, polydipsia, polyuria, and vomiting. She does endorse some paraesthesias and floaters. She does check a fasting sugar in the morning, which reports has been running 130-200. She is currently on metformin 500 mg QID, glipizide XL 5 mg, and NPH 35 units in AM and 25 units at night (given by husband). Last A1C in the office was:  Lab Results  Component Value Date   HGBA1C 8.0 (H) 09/10/2017   She is on thyroid medication. Her medication was not changed last visit.   Lab Results  Component Value Date   TSH 1.68 09/10/2017   Patient is on allopurinol for gout and does not report a recent flare.  Lab Results  Component Value Date   LABURIC 3.1 09/10/2017      Current Medications:  Current Outpatient Medications on File Prior to Visit  Medication Sig  . allopurinol (ZYLOPRIM) 300 MG tablet Take 1 tablet (300 mg total) by mouth every morning.  Marland Kitchen aspirin EC 81 MG tablet Take 81 mg by mouth daily.  Marland Kitchen atorvastatin (LIPITOR) 40 MG tablet Take 1 tablet (40 mg total) daily by mouth.  . clonazePAM (KLONOPIN) 1 MG tablet Take 1 mg 2 (two) times daily as needed by mouth for anxiety.   . fenofibrate (TRICOR) 145 MG tablet TAKE 1 TABLET BY MOUTH EVERY DAY  . FLUoxetine (PROZAC) 40 MG capsule TAKE ONE CAPSULE BY MOUTH EVERY DAY  . gabapentin (NEURONTIN) 300 MG capsule TAKE ONE CAPSULE BY MOUTH IN THE EVENING  . glipiZIDE (GLUCOTROL XL) 5 MG 24 hr tablet TAKE 1 TABLET BY MOUTH EVERY DAY BEFORE BREAKFAST  . glucose blood (ONE TOUCH ULTRA TEST) test strip TEST BLOOD SUGAR 3 TIMES A DAY (DX E11.9)  . hydrochlorothiazide (HYDRODIURIL) 25 MG tablet TAKE 1 TABLET BY MOUTH EVERY OTHER DAY  . ipratropium (ATROVENT) 0.06 % nasal spray Use 1 to 2 sprays each nostril 2 to 3 x / day as needed  . levothyroxine (SYNTHROID, LEVOTHROID) 100 MCG tablet Take 1 tablet (100 mcg total) by mouth daily.  Marland Kitchen losartan  (COZAAR) 100 MG tablet TAKE 1 TABLET BY MOUTH EVERY DAY FOR BLOOD PRESSURE AND KIDNEY PROTECTION  . metFORMIN (GLUCOPHAGE XR) 500 MG 24 hr tablet Take 2 tablets 2 x / day for Diabetes  . NOVOLIN N 100 UNIT/ML injection INJECT 50 UNITS EVERY MORNING AND 25 UNITS EVERY EVENING  . OVER THE COUNTER MEDICATION Takes 1 MI daily  . phentermine (ADIPEX-P) 37.5 MG tablet TAKE 1 TABLET BY MOUTH EVERY DAY  . predniSONE (DELTASONE) 10 MG tablet 1 tab 3 x day for 2 days, then 1 tab 2 x day for 2 days, then 1 tab 1 x day for 3 days  . ranitidine (ZANTAC) 300 MG tablet TAKE 1 TABLET (300 MG TOTAL) BY MOUTH 2 (TWO) TIMES DAILY.  Marland Kitchen Zinc 50 MG CAPS Take 1 capsule by mouth.  . gabapentin (NEURONTIN) 100 MG capsule TAKE 1 CAPSULE BY MOUTH EVERY DAY IN THE MORNING   No current facility-administered medications on file prior to visit.      Allergies:  Allergies  Allergen Reactions  . Sulfa Antibiotics Other (See Comments)    Pt states she had "extreme pain"  . Sulfacetamide Sodium  Medical History:  Past Medical History:  Diagnosis Date  . Anxiety disorder   . Arthritis LOWER BACK  . Asthma   . Bipolar 1 disorder ([place])   . Carpal tunnel syndrome   . Chronic kidney disease    stage 3  . Depression   . Diabetes mellitus    type 2  . Dyslipidemia   . Gait disorder   . GERD (gastroesophageal reflux disease)   . Gout LEFT FOOT-  STABLE  . H/O hiatal hernia   . History of CVA (cerebrovascular accident) FOUND PER MRI 1994--  RESIDUAL MEMORY IMPAIRED  . Hypercholesteremia   . Hypertension   . Memory difficulty 08/14/2016  . Neuropathy of lower extremity   . OCD (obsessive compulsive disorder)   . Peripheral neuropathy   . SOB (shortness of breath) on exertion   . Stroke ([place])    2000 memory loss   . SUI (stress urinary incontinence, female)   . Vulva cancer ([place]) 07/07/2012  . Vulvar lesion    Family history- Reviewed and unchanged Social history- Reviewed and unchanged   Review of  Systems:  Review of Systems  Constitutional: Negative for malaise/fatigue and weight loss.  HENT: Negative for hearing loss and tinnitus.   Eyes: Negative for blurred vision, double vision, photophobia, pain, discharge and redness.       Sees ? Floaters in bilateral eyes, has follow up with ophthalmology  Respiratory: Negative for cough, sputum production, shortness of breath and wheezing.   Cardiovascular: Negative for chest pain, palpitations, orthopnea, claudication, leg swelling and PND.  Gastrointestinal: Negative for abdominal pain, blood in stool, constipation, diarrhea, heartburn, melena, nausea and vomiting.  Genitourinary: Negative.   Musculoskeletal: Negative for falls, joint pain and myalgias.  Skin: Negative for rash.  Neurological: Negative for dizziness, tingling, sensory change, weakness and headaches.  Endo/Heme/Allergies: Negative for polydipsia.  Psychiatric/Behavioral: Negative for depression, memory loss, substance abuse and suicidal ideas. The patient is nervous/anxious. The patient does not have insomnia.   All other systems reviewed and are negative.   Physical Exam: BP 136/82   Pulse 92   Temp (!) 97.5 F (36.4 C)   Ht 5' 3.5" (1.613 m)   Wt 174 lb (78.9 kg)   SpO2 99%   BMI 30.34 kg/m  Wt Readings from Last 3 Encounters:  01/07/18 174 lb (78.9 kg)  11/25/17 172 lb 9.6 oz (78.3 kg)  10/20/17 169 lb 9.6 oz (76.9 kg)   General Appearance: Well nourished, in no apparent distress. Eyes: PERRLA, EOMs, conjunctiva no swelling or erythema Sinuses: No Frontal/maxillary tenderness ENT/Mouth: Ext aud canals clear, TMs without erythema, bulging. No erythema, swelling, or exudate on post pharynx.  Tonsils not swollen or erythematous. Hearing normal.  Neck: Supple, thyroid normal.  Respiratory: Respiratory effort normal, BS equal bilaterally without rales, rhonchi, wheezing or stridor.  Cardio: RRR with no MRGs. Brisk peripheral pulses without edema.  Abdomen:  Soft, + BS.  Non tender, no guarding, rebound, hernias, masses. Lymphatics: Non tender without lymphadenopathy.  Musculoskeletal: Full ROM, Symmetrical strength, slow gait with 4 point cane Skin: Warm, dry without rashes, lesions, ecchymosis.  Psych: Awake and oriented X 3, normal affect, Insight and Judgment questionable, wandering thought process    Izora Ribas, NP 3:55 PM [facility]

## 2018-01-07 ENCOUNTER — Ambulatory Visit (INDEPENDENT_AMBULATORY_CARE_PROVIDER_SITE_OTHER): Payer: PPO | Admitting: Adult Health

## 2018-01-07 ENCOUNTER — Encounter: Payer: Self-pay | Admitting: Adult Health

## 2018-01-07 VITALS — BP 136/82 | HR 92 | Temp 97.5°F | Ht 63.5 in | Wt 174.0 lb

## 2018-01-07 DIAGNOSIS — F332 Major depressive disorder, recurrent severe without psychotic features: Secondary | ICD-10-CM | POA: Diagnosis not present

## 2018-01-07 DIAGNOSIS — E1122 Type 2 diabetes mellitus with diabetic chronic kidney disease: Secondary | ICD-10-CM

## 2018-01-07 DIAGNOSIS — I739 Peripheral vascular disease, unspecified: Secondary | ICD-10-CM | POA: Diagnosis not present

## 2018-01-07 DIAGNOSIS — E1169 Type 2 diabetes mellitus with other specified complication: Secondary | ICD-10-CM

## 2018-01-07 DIAGNOSIS — E1165 Type 2 diabetes mellitus with hyperglycemia: Secondary | ICD-10-CM

## 2018-01-07 DIAGNOSIS — E559 Vitamin D deficiency, unspecified: Secondary | ICD-10-CM

## 2018-01-07 DIAGNOSIS — E669 Obesity, unspecified: Secondary | ICD-10-CM

## 2018-01-07 DIAGNOSIS — N183 Chronic kidney disease, stage 3 unspecified: Secondary | ICD-10-CM

## 2018-01-07 DIAGNOSIS — Z79899 Other long term (current) drug therapy: Secondary | ICD-10-CM

## 2018-01-07 DIAGNOSIS — E785 Hyperlipidemia, unspecified: Secondary | ICD-10-CM | POA: Diagnosis not present

## 2018-01-07 DIAGNOSIS — I10 Essential (primary) hypertension: Secondary | ICD-10-CM | POA: Diagnosis not present

## 2018-01-07 DIAGNOSIS — E039 Hypothyroidism, unspecified: Secondary | ICD-10-CM | POA: Diagnosis not present

## 2018-01-07 NOTE — Patient Instructions (Addendum)
  Stop taking the phentermine for 1 month (until 02/07/2018) then restart - remember phentermine will not help you lose weight unless you work on diet/exercise at the same time.     Aim for 7+ servings of fruits and vegetables daily  80+ fluid ounces of water or unsweet tea for healthy kidneys  Limit alcohol intake - max 1 drink per day  Limit animal fats in diet for cholesterol and heart health - choose grass fed whenever available  Aim for low stress - take time to unwind and care for your mental health  Aim for 150 min of moderate intensity exercise weekly for heart health, and weights twice weekly for bone health  Aim for 7-9 hours of sleep daily      When it comes to diets, agreement about the perfect plan isn't easy to find, even among the experts. Experts at the [facility] [facility] developed an idea known as the Healthy Eating Plate. Just imagine a plate divided into logical, healthy portions.  The emphasis is on diet quality:  Load up on vegetables and fruits - one-half of your plate: Aim for color and variety, and remember that potatoes don't count.  Go for whole grains - one-quarter of your plate: Whole wheat, barley, wheat berries, quinoa, oats, brown rice, and foods made with them. If you want pasta, go with whole wheat pasta.  Protein power - one-quarter of your plate: Fish, chicken, beans, and nuts are all healthy, versatile protein sources. Limit red meat.  The diet, however, does go beyond the plate, offering a few other suggestions.  Use healthy plant oils, such as olive, canola, soy, corn, sunflower and peanut. Check the labels, and avoid partially hydrogenated oil, which have unhealthy trans fats.  If you're thirsty, drink water. Coffee and tea are good in moderation, but skip sugary drinks and limit milk and dairy products to one or two daily servings.  The type of carbohydrate in the diet is more important than the amount. Some sources of  carbohydrates, such as vegetables, fruits, whole grains, and beans-are healthier than others.  Finally, stay active.

## 2018-01-08 LAB — CBC WITH DIFFERENTIAL/PLATELET
Basophils Absolute: 37 cells/uL (ref 0–200)
Basophils Relative: 0.6 %
Eosinophils Absolute: 81 cells/uL (ref 15–500)
Eosinophils Relative: 1.3 %
HCT: 35.5 % (ref 35.0–45.0)
Hemoglobin: 11.9 g/dL (ref 11.7–15.5)
Lymphs Abs: 1761 cells/uL (ref 850–3900)
MCH: 28.9 pg (ref 27.0–33.0)
MCHC: 33.5 g/dL (ref 32.0–36.0)
MCV: 86.2 fL (ref 80.0–100.0)
MPV: 10 fL (ref 7.5–12.5)
Monocytes Relative: 10.7 %
Neutro Abs: 3658 cells/uL (ref 1500–7800)
Neutrophils Relative %: 59 %
Platelets: 317 10*3/uL (ref 140–400)
RBC: 4.12 10*6/uL (ref 3.80–5.10)
RDW: 13 % (ref 11.0–15.0)
Total Lymphocyte: 28.4 %
WBC mixed population: 663 cells/uL (ref 200–950)
WBC: 6.2 10*3/uL (ref 3.8–10.8)

## 2018-01-08 LAB — HEMOGLOBIN A1C
Hgb A1c MFr Bld: 9.2 % of total Hgb — ABNORMAL HIGH (ref <5.7)
Mean Plasma Glucose: 217 (calc)
eAG (mmol/L): 12 (calc)

## 2018-01-08 LAB — COMPLETE METABOLIC PANEL WITH GFR
AG Ratio: 1.4 (calc) (ref 1.0–2.5)
ALT: 19 U/L (ref 6–29)
AST: 20 U/L (ref 10–35)
Albumin: 3.6 g/dL (ref 3.6–5.1)
Alkaline phosphatase (APISO): 39 U/L (ref 33–130)
BUN/Creatinine Ratio: 18 (calc) (ref 6–22)
BUN: 19 mg/dL (ref 7–25)
CO2: 32 mmol/L (ref 20–32)
Calcium: 10.6 mg/dL — ABNORMAL HIGH (ref 8.6–10.4)
Chloride: 97 mmol/L — ABNORMAL LOW (ref 98–110)
Creat: 1.06 mg/dL — ABNORMAL HIGH (ref 0.60–0.93)
GFR, Est African American: 62 mL/min/{1.73_m2} (ref >=60)
GFR, Est Non African American: 53 mL/min/{1.73_m2} — ABNORMAL LOW (ref >=60)
Globulin: 2.5 g/dL (calc) (ref 1.9–3.7)
Glucose, Bld: 117 mg/dL — ABNORMAL HIGH (ref 65–99)
Potassium: 4.7 mmol/L (ref 3.5–5.3)
Sodium: 135 mmol/L (ref 135–146)
Total Bilirubin: 0.4 mg/dL (ref 0.2–1.2)
Total Protein: 6.1 g/dL (ref 6.1–8.1)

## 2018-01-08 LAB — TSH: TSH: 2.95 mIU/L (ref 0.40–4.50)

## 2018-01-08 LAB — LIPID PANEL
Cholesterol: 171 mg/dL (ref <200)
HDL: 47 mg/dL — ABNORMAL LOW (ref >50)
LDL Cholesterol (Calc): 99 mg/dL (calc)
Non-HDL Cholesterol (Calc): 124 mg/dL (calc) (ref <130)
Total CHOL/HDL Ratio: 3.6 (calc) (ref <5.0)
Triglycerides: 152 mg/dL — ABNORMAL HIGH (ref <150)

## 2018-01-08 LAB — VITAMIN D 25 HYDROXY (VIT D DEFICIENCY, FRACTURES): Vit D, 25-Hydroxy: 38 ng/mL (ref 30–100)

## 2018-01-14 ENCOUNTER — Encounter: Payer: Self-pay | Admitting: *Deleted

## 2018-01-25 ENCOUNTER — Other Ambulatory Visit: Payer: Self-pay | Admitting: *Deleted

## 2018-01-27 ENCOUNTER — Ambulatory Visit: Payer: Self-pay | Admitting: Internal Medicine

## 2018-01-29 ENCOUNTER — Ambulatory Visit (INDEPENDENT_AMBULATORY_CARE_PROVIDER_SITE_OTHER): Payer: PPO | Admitting: Internal Medicine

## 2018-01-29 VITALS — BP 132/80 | HR 80 | Temp 97.4°F | Resp 18 | Ht 63.5 in | Wt 166.0 lb

## 2018-01-29 DIAGNOSIS — M109 Gout, unspecified: Secondary | ICD-10-CM | POA: Diagnosis not present

## 2018-01-29 DIAGNOSIS — I10 Essential (primary) hypertension: Secondary | ICD-10-CM | POA: Diagnosis not present

## 2018-01-29 DIAGNOSIS — Z79899 Other long term (current) drug therapy: Secondary | ICD-10-CM | POA: Diagnosis not present

## 2018-01-29 MED ORDER — ALLOPURINOL 300 MG PO TABS: ORAL_TABLET | ORAL | 1 refills | Status: DC

## 2018-01-29 NOTE — Patient Instructions (Signed)
Gout Gout is painful swelling that can occur in some of your joints. Gout is a type of arthritis. This condition is caused by having too much uric acid in your body. Uric acid is a chemical that forms when your body breaks down substances called purines. Purines are important for building body proteins. When your body has too much uric acid, sharp crystals can form and build up inside your joints. This causes pain and swelling. Gout attacks can happen quickly and be very painful (acute gout). Over time, the attacks can affect more joints and become more frequent (chronic gout). Gout can also cause uric acid to build up under your skin and inside your kidneys. What are the causes? This condition is caused by too much uric acid in your blood. This can occur because:  Your kidneys do not remove enough uric acid from your blood. This is the most common cause.  Your body makes too much uric acid. This can occur with some cancers and cancer treatments. It can also occur if your body is breaking down too many red blood cells (hemolytic anemia).  You eat too many foods that are high in purines. These foods include organ meats and some seafood. Alcohol, especially beer, is also high in purines.  A gout attack may be triggered by trauma or stress. What increases the risk? This condition is more likely to develop in people who:  Have a family history of gout.  Are female and middle-aged.  Are female and have gone through menopause.  Are obese.  Frequently drink alcohol, especially beer.  Are dehydrated.  Lose weight too quickly.  Have an organ transplant.  Have lead poisoning.  Take certain medicines, including aspirin, cyclosporine, diuretics, levodopa, and niacin.  Have kidney disease or psoriasis.  What are the signs or symptoms? An attack of acute gout happens quickly. It usually occurs in just one joint. The most common place is the big toe. Attacks often start at night. Other joints  that may be affected include joints of the feet, ankle, knee, fingers, wrist, or elbow. Symptoms may include:  Severe pain.  Warmth.  Swelling.  Stiffness.  Tenderness. The affected joint may be very painful to touch.  Shiny, red, or purple skin.  Chills and fever.  Chronic gout may cause symptoms more frequently. More joints may be involved. You may also have white or yellow lumps (tophi) on your hands or feet or in other areas near your joints. How is this diagnosed? This condition is diagnosed based on your symptoms, medical history, and physical exam. You may have tests, such as:  Blood tests to measure uric acid levels.  Removal of joint fluid with a needle (aspiration) to look for uric acid crystals.  X-rays to look for joint damage.  How is this treated? Treatment for this condition has two phases: treating an acute attack and preventing future attacks. Acute gout treatment may include medicines to reduce pain and swelling, including:  NSAIDs.  Steroids. These are strong anti-inflammatory medicines that can be taken by mouth (orally) or injected into a joint.  Colchicine. This medicine relieves pain and swelling when it is taken soon after an attack. It can be given orally or through an IV tube.  Preventive treatment may include:  Daily use of smaller doses of NSAIDs or colchicine.  Use of a medicine that reduces uric acid levels in your blood.  Changes to your diet. You may need to see a specialist about healthy eating (dietitian).  Follow these instructions at home: During a Gout Attack  If directed, apply ice to the affected area: ? Put ice in a plastic bag. ? Place a towel between your skin and the bag. ? Leave the ice on for 20 minutes, 2-3 times a day.  Rest the joint as much as possible. If the affected joint is in your leg, you may be given crutches to use.  Raise (elevate) the affected joint above the level of your heart as often as  possible.  Drink enough fluids to keep your urine clear or pale yellow.  Take over-the-counter and prescription medicines only as told by your health care provider.  Do not drive or operate heavy machinery while taking prescription pain medicine.  Follow instructions from your health care provider about eating or drinking restrictions.  Return to your normal activities as told by your health care provider. Ask your health care provider what activities are safe for you. Avoiding Future Gout Attacks  Follow a low-purine diet as told by your dietitian or health care provider. Avoid foods and drinks that are high in purines, including liver, kidney, anchovies, asparagus, herring, mushrooms, mussels, and beer.  Limit alcohol intake to no more than 1 drink a day for nonpregnant women and 2 drinks a day for men. One drink equals 12 oz of beer, 5 oz of wine, or 1 oz of hard liquor.  Maintain a healthy weight or lose weight if you are overweight. If you want to lose weight, talk with your health care provider. It is important that you do not lose weight too quickly.  Start or maintain an exercise program as told by your health care provider.  Drink enough fluids to keep your urine clear or pale yellow.  Take over-the-counter and prescription medicines only as told by your health care provider.  Keep all follow-up visits as told by your health care provider. This is important.   Low-Purine Diet Purines are compounds that affect the level of uric acid in your body. A low-purine diet is a diet that is low in purines. Eating a low-purine diet can prevent the level of uric acid in your body from getting too high and causing gout or kidney stones or both. What do I need to know about this diet?  Choose low-purine foods. Examples of low-purine foods are listed in the next section.  Drink plenty of fluids, especially water. Fluids can help remove uric acid from your body. Try to drink 8-16 cups  (1.9-3.8 L) a day.  Limit foods high in fat, especially saturated fat, as fat makes it harder for the body to get rid of uric acid. Foods high in saturated fat include pizza, cheese, ice cream, whole milk, fried foods, and gravies. Choose foods that are lower in fat and lean sources of protein. Use olive oil when cooking as it contains healthy fats that are not high in saturated fat.  Limit alcohol. Alcohol interferes with the elimination of uric acid from your body. If you are having a gout attack, avoid all alcohol.  Keep in mind that different people's bodies react differently to different foods. You will probably learn over time which foods do or do not affect you. If you discover that a food tends to cause your gout to flare up, avoid eating that food. You can more freely enjoy foods that do not cause problems. If you have any questions about a food item, talk to your dietitian or health care provider. Which foods are  low, moderate, and high in purines? The following is a list of foods that are low, moderate, and high in purines. You can eat any amount of the foods that are low in purines. You may be able to have small amounts of foods that are moderate in purines. Ask your health care provider how much of a food moderate in purines you can have. Avoid foods high in purines. Grains  Foods low in purines: Enriched white bread, pasta, rice, cake, cornbread, popcorn.  Foods moderate in purines: Whole-grain breads and cereals, wheat germ, bran, oatmeal. Uncooked oatmeal. Dry wheat bran or wheat germ.  Foods high in purines: Pancakes, [place] toast, biscuits, muffins. Vegetables  Foods low in purines: All vegetables, except those that are moderate in purines.  Foods moderate in purines: Asparagus, cauliflower, spinach, mushrooms, green peas. Fruits  All fruits are low in purines. Meats and other Protein Foods  Foods low in purines: Eggs, nuts, peanut butter.  Foods moderate in purines:  80-90% lean beef, lamb, veal, pork, poultry, fish, eggs, peanut butter, nuts. Crab, lobster, oysters, and shrimp. Cooked dried beans, peas, and lentils.  Foods high in purines: Anchovies, sardines, herring, mussels, tuna, codfish, scallops, trout, and haddock. Berniece Salines. Organ meats (such as liver or kidney). Tripe. Game meat. Goose. Sweetbreads. Dairy  All dairy foods are low in purines. Low-fat and fat-free dairy products are best because they are low in saturated fat. Beverages  Drinks low in purines: Water, carbonated beverages, tea, coffee, cocoa.  Drinks moderate in purines: Soft drinks and other drinks sweetened with high-fructose corn syrup. Juices. To find whether a food or drink is sweetened with high-fructose corn syrup, look at the ingredients list.  Drinks high in purines: Alcoholic beverages (such as beer). Condiments  Foods low in purines: Salt, herbs, olives, pickles, relishes, vinegar.  Foods moderate in purines: Butter, margarine, oils, mayonnaise. Fats and Oils  Foods low in purines: All types, except gravies and sauces made with meat.  Foods high in purines: Gravies and sauces made with meat. Other Foods  Foods low in purines: Sugars, sweets, gelatin. Cake. Soups made without meat.  Foods moderate in purines: Meat-based or fish-based soups, broths, or bouillons. Foods and drinks sweetened with high-fructose corn syrup.  Foods high in purines: High-fat desserts (such as ice cream, cookies, cakes, pies, doughnuts, and chocolate). Contact your dietitian for more information on foods that are not listed here. This information is not intended to replace advice given to you by your health care provider. Make sure you discuss any questions you have with your health care provider. Document Released: 11/29/2010 Document Revised: 01/10/2016 Document Reviewed: 07/11/2013 Elsevier Interactive Patient Education  2017 [employer].

## 2018-01-30 LAB — CBC WITH DIFFERENTIAL/PLATELET
Basophils Absolute: 40 cells/uL (ref 0–200)
Basophils Relative: 0.5 %
Eosinophils Absolute: 80 cells/uL (ref 15–500)
Eosinophils Relative: 1 %
HCT: 37.1 % (ref 35.0–45.0)
Hemoglobin: 12.8 g/dL (ref 11.7–15.5)
Lymphs Abs: 2288 cells/uL (ref 850–3900)
MCH: 29.6 pg (ref 27.0–33.0)
MCHC: 34.5 g/dL (ref 32.0–36.0)
MCV: 85.9 fL (ref 80.0–100.0)
MPV: 10.1 fL (ref 7.5–12.5)
Monocytes Relative: 7.8 %
Neutro Abs: 4968 cells/uL (ref 1500–7800)
Neutrophils Relative %: 62.1 %
Platelets: 346 10*3/uL (ref 140–400)
RBC: 4.32 10*6/uL (ref 3.80–5.10)
RDW: 12.8 % (ref 11.0–15.0)
Total Lymphocyte: 28.6 %
WBC mixed population: 624 cells/uL (ref 200–950)
WBC: 8 10*3/uL (ref 3.8–10.8)

## 2018-01-30 LAB — URIC ACID: Uric Acid, Serum: 5.4 mg/dL (ref 2.5–7.0)

## 2018-01-30 LAB — COMPLETE METABOLIC PANEL WITH GFR
AG Ratio: 1.5 (calc) (ref 1.0–2.5)
ALT: 18 U/L (ref 6–29)
AST: 16 U/L (ref 10–35)
Albumin: 3.8 g/dL (ref 3.6–5.1)
Alkaline phosphatase (APISO): 45 U/L (ref 33–130)
BUN/Creatinine Ratio: 19 (calc) (ref 6–22)
BUN: 18 mg/dL (ref 7–25)
CO2: 29 mmol/L (ref 20–32)
Calcium: 10.1 mg/dL (ref 8.6–10.4)
Chloride: 98 mmol/L (ref 98–110)
Creat: 0.97 mg/dL — ABNORMAL HIGH (ref 0.60–0.93)
GFR, Est African American: 69 mL/min/{1.73_m2} (ref >=60)
GFR, Est Non African American: 59 mL/min/{1.73_m2} — ABNORMAL LOW (ref >=60)
Globulin: 2.6 g/dL (calc) (ref 1.9–3.7)
Glucose, Bld: 121 mg/dL — ABNORMAL HIGH (ref 65–99)
Potassium: 4.1 mmol/L (ref 3.5–5.3)
Sodium: 135 mmol/L (ref 135–146)
Total Bilirubin: 0.3 mg/dL (ref 0.2–1.2)
Total Protein: 6.4 g/dL (ref 6.1–8.1)

## 2018-01-31 ENCOUNTER — Encounter: Payer: Self-pay | Admitting: Internal Medicine

## 2018-01-31 NOTE — Progress Notes (Signed)
  Subjective:    Patient ID: Ashley [name], female    DOB: [birth year], 72 y.o.   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 Apparently she has omitted filling her Allopurinol meds.        Review of Systems     Objective:   Physical Exam  BP 132/80   Pulse 80   Temp (!) 97.4 F (36.3 C)   Resp 18   Ht 5' 3.5" (1.613 m)   Wt 166 lb (75.3 kg)   BMI 28.94 kg/m   HEENT - WNL. Neck - supple.  Chest - Clear equal BS. Cor - Nl HS. RRR w/o sig MGR. PP 1(+). 1 (+) pretibial, ankle/pedal edema. MS- FROM w/o deformities. No point tenderness about  The feet/toes Gait Nl. Neuro -  Nl w/o focal abnormalities.    Assessment & Plan:   1. Essential hypertension  - CBC with Differential/Platelet - COMPLETE METABOLIC PANEL WITH GFR  2. Gout, hx  - CBC with Differential/Platelet - COMPLETE METABOLIC PANEL WITH GFR - Uric acid  - allopurinol (ZYLOPRIM) 300 MG tablet; Take 1 tablet daily to prevent Gout  Dispense: 90 tablet; Refill: 1  3. Medication management  - CBC with Differential/Platelet

## 2018-02-04 ENCOUNTER — Other Ambulatory Visit: Payer: Self-pay | Admitting: Physician Assistant

## 2018-02-04 DIAGNOSIS — E1140 Type 2 diabetes mellitus with diabetic neuropathy, unspecified: Secondary | ICD-10-CM

## 2018-02-09 ENCOUNTER — Encounter: Payer: Self-pay | Admitting: Internal Medicine

## 2018-02-23 ENCOUNTER — Ambulatory Visit: Payer: PPO | Admitting: Gastroenterology

## 2018-03-02 ENCOUNTER — Other Ambulatory Visit: Payer: Self-pay | Admitting: Internal Medicine

## 2018-03-06 ENCOUNTER — Other Ambulatory Visit: Payer: Self-pay | Admitting: Internal Medicine

## 2018-03-26 ENCOUNTER — Encounter: Payer: Self-pay | Admitting: Gastroenterology

## 2018-03-30 ENCOUNTER — Encounter: Payer: Self-pay | Admitting: Internal Medicine

## 2018-04-03 ENCOUNTER — Other Ambulatory Visit: Payer: Self-pay | Admitting: Adult Health

## 2018-04-03 DIAGNOSIS — I10 Essential (primary) hypertension: Secondary | ICD-10-CM

## 2018-04-07 ENCOUNTER — Other Ambulatory Visit: Payer: Self-pay | Admitting: Internal Medicine

## 2018-04-07 ENCOUNTER — Other Ambulatory Visit: Payer: Self-pay | Admitting: *Deleted

## 2018-04-07 MED ORDER — FENOFIBRATE 145 MG PO TABS: 145.0000 mg | ORAL_TABLET | Freq: Every day | ORAL | 1 refills | Status: DC

## 2018-04-07 NOTE — Telephone Encounter (Signed)
Patient called and states she had found bottle of Atorvastatin and Fenofibrate, which she has not been taking. Per Dr Melford Aase, she needs to restart both and a refill was sent in for the Fenofibrate to her pharmacy.

## 2018-04-12 ENCOUNTER — Other Ambulatory Visit: Payer: Self-pay | Admitting: Internal Medicine

## 2018-04-12 DIAGNOSIS — E039 Hypothyroidism, unspecified: Secondary | ICD-10-CM

## 2018-04-15 ENCOUNTER — Encounter: Payer: Self-pay | Admitting: Internal Medicine

## 2018-04-15 ENCOUNTER — Ambulatory Visit (INDEPENDENT_AMBULATORY_CARE_PROVIDER_SITE_OTHER): Payer: PPO | Admitting: Internal Medicine

## 2018-04-15 VITALS — BP 130/66 | HR 66 | Temp 97.8°F | Resp 16 | Ht 63.5 in | Wt 168.0 lb

## 2018-04-15 DIAGNOSIS — E782 Mixed hyperlipidemia: Secondary | ICD-10-CM

## 2018-04-15 DIAGNOSIS — Z136 Encounter for screening for cardiovascular disorders: Secondary | ICD-10-CM | POA: Diagnosis not present

## 2018-04-15 DIAGNOSIS — I10 Essential (primary) hypertension: Secondary | ICD-10-CM

## 2018-04-15 DIAGNOSIS — E113293 Type 2 diabetes mellitus with mild nonproliferative diabetic retinopathy without macular edema, bilateral: Secondary | ICD-10-CM

## 2018-04-15 DIAGNOSIS — E1159 Type 2 diabetes mellitus with other circulatory complications: Secondary | ICD-10-CM

## 2018-04-15 DIAGNOSIS — E1165 Type 2 diabetes mellitus with hyperglycemia: Secondary | ICD-10-CM | POA: Diagnosis not present

## 2018-04-15 DIAGNOSIS — E785 Hyperlipidemia, unspecified: Secondary | ICD-10-CM

## 2018-04-15 DIAGNOSIS — Z8249 Family history of ischemic heart disease and other diseases of the circulatory system: Secondary | ICD-10-CM

## 2018-04-15 DIAGNOSIS — Z0000 Encounter for general adult medical examination without abnormal findings: Secondary | ICD-10-CM

## 2018-04-15 DIAGNOSIS — Z1211 Encounter for screening for malignant neoplasm of colon: Secondary | ICD-10-CM

## 2018-04-15 DIAGNOSIS — E119 Type 2 diabetes mellitus without complications: Secondary | ICD-10-CM

## 2018-04-15 DIAGNOSIS — M109 Gout, unspecified: Secondary | ICD-10-CM

## 2018-04-15 DIAGNOSIS — N183 Chronic kidney disease, stage 3 unspecified: Secondary | ICD-10-CM

## 2018-04-15 DIAGNOSIS — Z794 Long term (current) use of insulin: Secondary | ICD-10-CM | POA: Diagnosis not present

## 2018-04-15 DIAGNOSIS — F17200 Nicotine dependence, unspecified, uncomplicated: Secondary | ICD-10-CM

## 2018-04-15 DIAGNOSIS — Z0001 Encounter for general adult medical examination with abnormal findings: Secondary | ICD-10-CM

## 2018-04-15 DIAGNOSIS — E1169 Type 2 diabetes mellitus with other specified complication: Secondary | ICD-10-CM

## 2018-04-15 DIAGNOSIS — E039 Hypothyroidism, unspecified: Secondary | ICD-10-CM

## 2018-04-15 DIAGNOSIS — E1122 Type 2 diabetes mellitus with diabetic chronic kidney disease: Secondary | ICD-10-CM

## 2018-04-15 DIAGNOSIS — E559 Vitamin D deficiency, unspecified: Secondary | ICD-10-CM

## 2018-04-15 DIAGNOSIS — Z79899 Other long term (current) drug therapy: Secondary | ICD-10-CM

## 2018-04-15 DIAGNOSIS — G63 Polyneuropathy in diseases classified elsewhere: Secondary | ICD-10-CM

## 2018-04-15 DIAGNOSIS — Z1212 Encounter for screening for malignant neoplasm of rectum: Secondary | ICD-10-CM

## 2018-04-15 NOTE — Patient Instructions (Addendum)
We Do NOT Approve of  [facility], [place] Soliciting Our Patients  To Do Home Visits & We Do NOT Approve of [employer] > > > > > > > > > > > > > > > > > > > > > > > > > > > > > > > > > > > > > > >  Preventive Care for Adults  A healthy lifestyle and preventive care can promote health and wellness. Preventive health guidelines for women include the following key practices.  A routine yearly physical is a good way to check with your health care provider about your health and preventive screening. It is a chance to share any concerns and updates on your health and to receive a thorough exam.  Visit your dentist for a routine exam and preventive care every 6 months. Brush your teeth twice a day and floss once a day. Good oral hygiene prevents tooth decay and gum disease.  The frequency of eye exams is based on your age, health, family medical history, use of contact lenses, and other factors. Follow your health care provider's recommendations for frequency of eye exams.  Eat a healthy diet. Foods like vegetables, fruits, whole grains, low-fat dairy products, and lean protein foods contain the nutrients you need without too many calories. Decrease your intake of foods high in solid fats, added sugars, and salt. Eat the right amount of calories for you. Get information about a proper diet from your health care provider, if necessary.  Regular physical exercise is one of the most important things you can do for your health. Most adults should get at least 150 minutes of moderate-intensity exercise (any activity that increases your heart rate and causes you to sweat) each week. In addition, most adults need muscle-strengthening exercises on 2 or more days a week.  Maintain a healthy weight. The body mass index (BMI) is a screening tool to identify possible weight problems. It provides an estimate of body fat based on height and weight. Your health care provider can find your BMI  and can help you achieve or maintain a healthy weight. For adults 20 years and older:  A BMI below 18.5 is considered underweight.  A BMI of 18.5 to 24.9 is normal.  A BMI of 25 to 29.9 is considered overweight.  A BMI of 30 and above is considered obese.  Maintain normal blood lipids and cholesterol levels by exercising and minimizing your intake of saturated fat. Eat a balanced diet with plenty of fruit and vegetables. If your lipid or cholesterol levels are high, you are over 50, or you are at high risk for heart disease, you may need your cholesterol levels checked more frequently. Ongoing high lipid and cholesterol levels should be treated with medicines if diet and exercise are not working.  If you smoke, find out from your health care provider how to quit. If you do not use tobacco, do not start.  Lung cancer screening is recommended for adults aged 55-80 years who are at high risk for developing lung cancer because of a history of smoking. A yearly low-dose CT scan of the lungs is recommended for people who have at least a 30-pack-year history of smoking and are a current smoker or have quit within the past 15 years. A pack year of smoking is smoking an average of 1 pack of cigarettes a day for 1 year (for example: 1 pack a day for 30 years or 2 packs a day   for 15 years). Yearly screening should continue until the smoker has stopped smoking for at least 15 years. Yearly screening should be stopped for people who develop a health problem that would prevent them from having lung cancer treatment.  Avoid use of street drugs. Do not share needles with anyone. Ask for help if you need support or instructions about stopping the use of drugs.  High blood pressure causes heart disease and increases the risk of stroke.  Ongoing high blood pressure should be treated with medicines if weight loss and exercise do not work.  If you are 29-34 years old, ask your health care provider if you should take  aspirin to prevent strokes.  Diabetes screening involves taking a blood sample to check your fasting blood sugar level. This should be done once every 3 years, after age 27, if you are within normal weight and without risk factors for diabetes. Testing should be considered at a younger age or be carried out more frequently if you are overweight and have at least 1 risk factor for diabetes.  Breast cancer screening is essential preventive care for women. You should practice "breast self-awareness." This means understanding the normal appearance and feel of your breasts and may include breast self-examination. Any changes detected, no matter how small, should be reported to a health care provider. Women in their 64s and 30s should have a clinical breast exam (CBE) by a health care provider as part of a regular health exam every 1 to 3 years. After age 69, women should have a CBE every year. Starting at age 6, women should consider having a mammogram (breast X-ray test) every year. Women who have a family history of breast cancer should talk to their health care provider about genetic screening. Women at a high risk of breast cancer should talk to their health care providers about having an MRI and a mammogram every year.  Breast cancer gene (BRCA)-related cancer risk assessment is recommended for women who have family members with BRCA-related cancers. BRCA-related cancers include breast, ovarian, tubal, and peritoneal cancers. Having family members with these cancers may be associated with an increased risk for harmful changes (mutations) in the breast cancer genes BRCA1 and BRCA2. Results of the assessment will determine the need for genetic counseling and BRCA1 and BRCA2 testing.  Routine pelvic exams to screen for cancer are no longer recommended for nonpregnant women who are considered low risk for cancer of the pelvic organs (ovaries, uterus, and vagina) and who do not have symptoms. Ask your health  care provider if a screening pelvic exam is right for you.  If you have had past treatment for cervical cancer or a condition that could lead to cancer, you need Pap tests and screening for cancer for at least 20 years after your treatment. If Pap tests have been discontinued, your risk factors (such as having a new sexual partner) need to be reassessed to determine if screening should be resumed. Some women have medical problems that increase the chance of getting cervical cancer. In these cases, your health care provider may recommend more frequent screening and Pap tests.    Colorectal cancer can be detected and often prevented. Most routine colorectal cancer screening begins at the age of 21 years and continues through age 8 years. However, your health care provider may recommend screening at an earlier age if you have risk factors for colon cancer. On a yearly basis, your health care provider may provide home test kits to check  for hidden blood in the stool. Use of a small camera at the end of a tube, to directly examine the colon (sigmoidoscopy or colonoscopy), can detect the earliest forms of colorectal cancer. Talk to your health care provider about this at age 50, when routine screening begins.  Direct exam of the colon should be repeated every 5-10 years through age 75 years, unless early forms of pre-cancerous polyps or small growths are found.  Osteoporosis is a disease in which the bones lose minerals and strength with aging. This can result in serious bone fractures or breaks. The risk of osteoporosis can be identified using a bone density scan. Women ages 65 years and over and women at risk for fractures or osteoporosis should discuss screening with their health care providers. Ask your health care provider whether you should take a calcium supplement or vitamin D to reduce the rate of osteoporosis.  Menopause can be associated with physical symptoms and risks. Hormone replacement therapy  is available to decrease symptoms and risks. You should talk to your health care provider about whether hormone replacement therapy is right for you.  Use sunscreen. Apply sunscreen liberally and repeatedly throughout the day. You should seek shade when your shadow is shorter than you. Protect yourself by wearing long sleeves, pants, a wide-brimmed hat, and sunglasses year round, whenever you are outdoors.  Once a month, do a whole body skin exam, using a mirror to look at the skin on your back. Tell your health care provider of new moles, moles that have irregular borders, moles that are larger than a pencil eraser, or moles that have changed in shape or color.  Stay current with required vaccines (immunizations).  Influenza vaccine. All adults should be immunized every year.  Tetanus, diphtheria, and acellular pertussis (Td, Tdap) vaccine. Pregnant women should receive 1 dose of Tdap vaccine during each pregnancy. The dose should be obtained regardless of the length of time since the last dose. Immunization is preferred during the 27th-36th week of gestation. An adult who has not previously received Tdap or who does not know her vaccine status should receive 1 dose of Tdap. This initial dose should be followed by tetanus and diphtheria toxoids (Td) booster doses every 10 years. Adults with an unknown or incomplete history of completing a 3-dose immunization series with Td-containing vaccines should begin or complete a primary immunization series including a Tdap dose. Adults should receive a Td booster every 10 years.    Zoster vaccine. One dose is recommended for adults aged 60 years or older unless certain conditions are present.    Pneumococcal 13-valent conjugate (PCV13) vaccine. When indicated, a person who is uncertain of her immunization history and has no record of immunization should receive the PCV13 vaccine. An adult aged 19 years or older who has certain medical conditions and has not  been previously immunized should receive 1 dose of PCV13 vaccine. This PCV13 should be followed with a dose of pneumococcal polysaccharide (PPSV23) vaccine. The PPSV23 vaccine dose should be obtained at least 1 or more year(s) after the dose of PCV13 vaccine. An adult aged 19 years or older who has certain medical conditions and previously received 1 or more doses of PPSV23 vaccine should receive 1 dose of PCV13. The PCV13 vaccine dose should be obtained 1 or more years after the last PPSV23 vaccine dose.    Pneumococcal polysaccharide (PPSV23) vaccine. When PCV13 is also indicated, PCV13 should be obtained first. All adults aged 65 years and older should   be immunized. An adult younger than age 65 years who has certain medical conditions should be immunized. Any person who resides in a nursing home or long-term care facility should be immunized. An adult smoker should be immunized. People with an immunocompromised condition and certain other conditions should receive both PCV13 and PPSV23 vaccines. People with human immunodeficiency virus (HIV) infection should be immunized as soon as possible after diagnosis. Immunization during chemotherapy or radiation therapy should be avoided. Routine use of PPSV23 vaccine is not recommended for American Indians, [place] Natives, or people younger than 65 years unless there are medical conditions that require PPSV23 vaccine. When indicated, people who have unknown immunization and have no record of immunization should receive PPSV23 vaccine. One-time revaccination 5 years after the first dose of PPSV23 is recommended for people aged 19-64 years who have chronic kidney failure, nephrotic syndrome, asplenia, or immunocompromised conditions. People who received 1-2 doses of PPSV23 before age 65 years should receive another dose of PPSV23 vaccine at age 65 years or later if at least 5 years have passed since the previous dose. Doses of PPSV23 are not needed for people immunized  with PPSV23 at or after age 65 years.   Preventive Services / Frequency  Ages 65 years and over  Blood pressure check.  Lipid and cholesterol check.  Lung cancer screening. / Every year if you are aged 55-80 years and have a 30-pack-year history of smoking and currently smoke or have quit within the past 15 years. Yearly screening is stopped once you have quit smoking for at least 15 years or develop a health problem that would prevent you from having lung cancer treatment.  Clinical breast exam.** / Every year after age 40 years.   BRCA-related cancer risk assessment.** / For women who have family members with a BRCA-related cancer (breast, ovarian, tubal, or peritoneal cancers).  Mammogram.** / Every year beginning at age 40 years and continuing for as long as you are in good health. Consult with your health care provider.  Pap test.** / Every 3 years starting at age 30 years through age 65 or 70 years with 3 consecutive normal Pap tests. Testing can be stopped between 65 and 70 years with 3 consecutive normal Pap tests and no abnormal Pap or HPV tests in the past 10 years.  Fecal occult blood test (FOBT) of stool. / Every year beginning at age 50 years and continuing until age 75 years. You may not need to do this test if you get a colonoscopy every 10 years.  Flexible sigmoidoscopy or colonoscopy.** / Every 5 years for a flexible sigmoidoscopy or every 10 years for a colonoscopy beginning at age 50 years and continuing until age 75 years.  Hepatitis C blood test.** / For all people born from 1945 through 1965 and any individual with known risks for hepatitis C.  Osteoporosis screening.** / A one-time screening for women ages 65 years and over and women at risk for fractures or osteoporosis.  Skin self-exam. / Monthly.  Influenza vaccine. / Every year.  Tetanus, diphtheria, and acellular pertussis (Tdap/Td) vaccine.** / 1 dose of Td every 10 years.  Zoster vaccine.** / 1 dose  for adults aged 60 years or older.  Pneumococcal 13-valent conjugate (PCV13) vaccine.** / Consult your health care provider.  Pneumococcal polysaccharide (PPSV23) vaccine.** / 1 dose for all adults aged 65 years and older. Screening for abdominal aortic aneurysm (AAA)  by ultrasound is recommended for people who have history of high blood pressure   or who are current or former smokers. ++++++++++++++++++++ Recommend Adult Low Dose Aspirin or  coated  Aspirin 81 mg daily  To reduce risk of Colon Cancer 20 %,  Skin Cancer 26 % ,  Melanoma 46%  and  Pancreatic cancer 60% ++++++++++++++++++++ Vitamin D goal  is between 70-100.  Please make sure that you are taking your Vitamin D as directed.  It is very important as a natural anti-inflammatory  helping hair, skin, and nails, as well as reducing stroke and heart attack risk.  It helps your bones and helps with mood. It also decreases numerous cancer risks so please take it as directed.  Low Vit D is associated with a 200-300% higher risk for CANCER  and 200-300% higher risk for HEART   ATTACK  &  STROKE.   ...................................... It is also associated with higher death rate at younger ages,  autoimmune diseases like Rheumatoid arthritis, Lupus, Multiple Sclerosis.    Also many other serious conditions, like depression, Alzheimer's Dementia, infertility, muscle aches, fatigue, fibromyalgia - just to name a few. ++++++++++++++++++ Recommend the book "The END of DIETING" by Dr Joel Fuhrman  & the book "The END of DIABETES " by Dr Joel Fuhrman At [employer] - get book & Audio CD's    Being diabetic has a  300% increased risk for heart attack, stroke, cancer, and alzheimer- type vascular dementia. It is very important that you work harder with diet by avoiding all foods that are white. Avoid white rice (brown & wild rice is OK), white potatoes (sweetpotatoes in moderation is OK), White bread or wheat bread or anything made out of  white flour like bagels, donuts, rolls, buns, biscuits, cakes, pastries, cookies, pizza crust, and pasta (made from white flour & egg whites) - vegetarian pasta or spinach or wheat pasta is OK. Multigrain breads like Arnold's or Pepperidge [employer], or multigrain sandwich thins or flatbreads.  Diet, exercise and weight loss can reverse and cure diabetes in the early stages.  Diet, exercise and weight loss is very important in the control and prevention of complications of diabetes which affects every system in your body, ie. Brain - dementia/stroke, eyes - glaucoma/blindness, heart - heart attack/heart failure, kidneys - dialysis, stomach - gastric paralysis, intestines - malabsorption, nerves - severe painful neuritis, circulation - gangrene & loss of a leg(s), and finally cancer and Alzheimers.    I recommend avoid fried & greasy foods,  sweets/candy, white rice (brown or wild rice or Quinoa is OK), white potatoes (sweet potatoes are OK) - anything made from white flour - bagels, doughnuts, rolls, buns, biscuits,white and wheat breads, pizza crust and traditional pasta made of white flour & egg white(vegetarian pasta or spinach or wheat pasta is OK).  Multi-grain bread is OK - like multi-grain flat bread or sandwich thins. Avoid alcohol in excess. Exercise is also important.    Eat all the vegetables you want - avoid meat, especially red meat and dairy - especially cheese.  Cheese is the most concentrated form of trans-fats which is the worst thing to clog up our arteries. Veggie cheese is OK which can be found in the fresh produce section at [employer] or [employer] or [employer]  +++++++++++++++++++ DASH Eating Plan  DASH stands for "Dietary Approaches to Stop Hypertension."   The DASH eating plan is a healthy eating plan that has been shown to reduce high blood pressure (hypertension). Additional health benefits may include reducing the risk of type 2 diabetes mellitus, heart disease,   and stroke. The  DASH eating plan may also help with weight loss. WHAT DO I NEED TO KNOW ABOUT THE DASH EATING PLAN? For the DASH eating plan, you will follow these general guidelines:  Choose foods with a percent daily value for sodium of less than 5% (as listed on the food label).  Use salt-free seasonings or herbs instead of table salt or sea salt.  Check with your health care provider or pharmacist before using salt substitutes.  Eat lower-sodium products, often labeled as "lower sodium" or "no salt added."  Eat fresh foods.  Eat more vegetables, fruits, and low-fat dairy products.  Choose whole grains. Look for the word "whole" as the first word in the ingredient list.  Choose fish   Limit sweets, desserts, sugars, and sugary drinks.  Choose heart-healthy fats.  Eat veggie cheese   Eat more home-cooked food and less restaurant, buffet, and fast food.  Limit fried foods.  Cook foods using methods other than frying.  Limit canned vegetables. If you do use them, rinse them well to decrease the sodium.  When eating at a restaurant, ask that your food be prepared with less salt, or no salt if possible.                      WHAT FOODS CAN I EAT? Read Dr Fara Olden Fuhrman's books on The End of Dieting & The End of Diabetes  Grains Whole grain or whole wheat bread. Brown rice. Whole grain or whole wheat pasta. Quinoa, bulgur, and whole grain cereals. Low-sodium cereals. Corn or whole wheat flour tortillas. Whole grain cornbread. Whole grain crackers. Low-sodium crackers.  Vegetables Fresh or frozen vegetables (raw, steamed, roasted, or grilled). Low-sodium or reduced-sodium tomato and vegetable juices. Low-sodium or reduced-sodium tomato sauce and paste. Low-sodium or reduced-sodium canned vegetables.   Fruits All fresh, canned (in natural juice), or frozen fruits.  Protein Products  All fish and seafood.  Dried beans, peas, or lentils. Unsalted nuts and seeds. Unsalted canned  beans.  Dairy Low-fat dairy products, such as skim or 1% milk, 2% or reduced-fat cheeses, low-fat ricotta or cottage cheese, or plain low-fat yogurt. Low-sodium or reduced-sodium cheeses.  Fats and Oils Tub margarines without trans fats. Light or reduced-fat mayonnaise and salad dressings (reduced sodium). Avocado. Safflower, olive, or canola oils. Natural peanut or almond butter.  Other Unsalted popcorn and pretzels. The items listed above may not be a complete list of recommended foods or beverages. Contact your dietitian for more options.  +++++++++++++++  WHAT FOODS ARE NOT RECOMMENDED? Grains/ White flour or wheat flour White bread. White pasta. White rice. Refined cornbread. Bagels and croissants. Crackers that contain trans fat.  Vegetables  Creamed or fried vegetables. Vegetables in a . Regular canned vegetables. Regular canned tomato sauce and paste. Regular tomato and vegetable juices.  Fruits Dried fruits. Canned fruit in light or heavy syrup. Fruit juice.  Meat and Other Protein Products Meat in general - RED meat & White meat.  Fatty cuts of meat. Ribs, chicken wings, all processed meats as bacon, sausage, bologna, salami, fatback, hot dogs, bratwurst and packaged luncheon meats.  Dairy Whole or 2% milk, cream, half-and-half, and cream cheese. Whole-fat or sweetened yogurt. Full-fat cheeses or blue cheese. Non-dairy creamers and whipped toppings. Processed cheese, cheese spreads, or cheese curds.  Condiments Onion and garlic salt, seasoned salt, table salt, and sea salt. Canned and packaged gravies. Worcestershire sauce. Tartar sauce. Barbecue sauce. Teriyaki sauce. Soy sauce, including reduced  sodium. Steak sauce. Fish sauce. Oyster sauce. Cocktail sauce. Horseradish. Ketchup and mustard. Meat flavorings and tenderizers. Bouillon cubes. Hot sauce. Tabasco sauce. Marinades. Taco seasonings. Relishes.  Fats and Oils Butter, stick margarine, lard, shortening and bacon  fat. Coconut, palm kernel, or palm oils. Regular salad dressings.  Pickles and olives. Salted popcorn and pretzels.  The items listed above may not be a complete list of foods and beverages to avoid.    We Do NOT Approve of  [facility], [employer]  Our Patients  To Do Home Visits & We Do NOT Approve of [facility] [employer] > > > > > > > > > > > > > > > > > > > > > > > > > > > > > > > > > > > > > > >  Preventive Care for Adults  A healthy lifestyle and preventive care can promote health and wellness. Preventive health guidelines for women include the following key practices.  A routine yearly physical is a good way to check with your health care provider about your health and preventive screening. It is a chance to share any concerns and updates on your health and to receive a thorough exam.  Visit your dentist for a routine exam and preventive care every 6 months. Brush your teeth twice a day and floss once a day. Good oral hygiene prevents tooth decay and gum disease.  The frequency of eye exams is based on your age, health, family medical history, use of contact lenses, and other factors. Follow your health care provider's recommendations for frequency of eye exams.  Eat a healthy diet. Foods like vegetables, fruits, whole grains, low-fat dairy products, and lean protein foods contain the nutrients you need without too many calories. Decrease your intake of foods high in solid fats, added sugars, and salt. Eat the right amount of calories for you. Get information about a proper diet from your health care provider, if necessary.  Regular physical exercise is one of the most important things you can do for your health. Most adults should get at least 150 minutes of moderate-intensity exercise (any activity that increases your heart rate and causes you to sweat) each week. In addition, most adults need muscle-strengthening exercises on 2 or more days a week.  Maintain a  healthy weight. The body mass index (BMI) is a screening tool to identify possible weight problems. It provides an estimate of body fat based on height and weight. Your health care provider can find your BMI and can help you achieve or maintain a healthy weight. For adults 20 years and older:  A BMI below 18.5 is considered underweight.  A BMI of 18.5 to 24.9 is normal.  A BMI of 25 to 29.9 is considered overweight.  A BMI of 30 and above is considered obese.  Maintain normal blood lipids and cholesterol levels by exercising and minimizing your intake of saturated fat. Eat a balanced diet with plenty of fruit and vegetables. If your lipid or cholesterol levels are high, you are over 50, or you are at high risk for heart disease, you may need your cholesterol levels checked more frequently. Ongoing high lipid and cholesterol levels should be treated with medicines if diet and exercise are not working.  If you smoke, find out from your health care provider how to quit. If you do not use tobacco, do not start.  Lung cancer screening is recommended for adults aged 81-80 years who are at high risk  for developing lung cancer because of a history of smoking. A yearly low-dose CT scan of the lungs is recommended for people who have at least a 30-pack-year history of smoking and are a current smoker or have quit within the past 15 years. A pack year of smoking is smoking an average of 1 pack of cigarettes a day for 1 year (for example: 1 pack a day for 30 years or 2 packs a day for 15 years). Yearly screening should continue until the smoker has stopped smoking for at least 15 years. Yearly screening should be stopped for people who develop a health problem that would prevent them from having lung cancer treatment.  Avoid use of street drugs. Do not share needles with anyone. Ask for help if you need support or instructions about stopping the use of drugs.  High blood pressure causes heart disease and  increases the risk of stroke.  Ongoing high blood pressure should be treated with medicines if weight loss and exercise do not work.  If you are 69-41 years old, ask your health care provider if you should take aspirin to prevent strokes.  Diabetes screening involves taking a blood sample to check your fasting blood sugar level. This should be done once every 3 years, after age 40, if you are within normal weight and without risk factors for diabetes. Testing should be considered at a younger age or be carried out more frequently if you are overweight and have at least 1 risk factor for diabetes.  Breast cancer screening is essential preventive care for women. You should practice "breast self-awareness." This means understanding the normal appearance and feel of your breasts and may include breast self-examination. Any changes detected, no matter how small, should be reported to a health care provider. Women in their 29s and 30s should have a clinical breast exam (CBE) by a health care provider as part of a regular health exam every 1 to 3 years. After age 77, women should have a CBE every year. Starting at age 33, women should consider having a mammogram (breast X-ray test) every year. Women who have a family history of breast cancer should talk to their health care provider about genetic screening. Women at a high risk of breast cancer should talk to their health care providers about having an MRI and a mammogram every year.  Breast cancer gene (BRCA)-related cancer risk assessment is recommended for women who have family members with BRCA-related cancers. BRCA-related cancers include breast, ovarian, tubal, and peritoneal cancers. Having family members with these cancers may be associated with an increased risk for harmful changes (mutations) in the breast cancer genes BRCA1 and BRCA2. Results of the assessment will determine the need for genetic counseling and BRCA1 and BRCA2 testing.  Routine pelvic  exams to screen for cancer are no longer recommended for nonpregnant women who are considered low risk for cancer of the pelvic organs (ovaries, uterus, and vagina) and who do not have symptoms. Ask your health care provider if a screening pelvic exam is right for you.  If you have had past treatment for cervical cancer or a condition that could lead to cancer, you need Pap tests and screening for cancer for at least 20 years after your treatment. If Pap tests have been discontinued, your risk factors (such as having a new sexual partner) need to be reassessed to determine if screening should be resumed. Some women have medical problems that increase the chance of getting cervical cancer. In these  cases, your health care provider may recommend more frequent screening and Pap tests.    Colorectal cancer can be detected and often prevented. Most routine colorectal cancer screening begins at the age of 37 years and continues through age 38 years. However, your health care provider may recommend screening at an earlier age if you have risk factors for colon cancer. On a yearly basis, your health care provider may provide home test kits to check for hidden blood in the stool. Use of a small camera at the end of a tube, to directly examine the colon (sigmoidoscopy or colonoscopy), can detect the earliest forms of colorectal cancer. Talk to your health care provider about this at age 92, when routine screening begins.  Direct exam of the colon should be repeated every 5-10 years through age 4 years, unless early forms of pre-cancerous polyps or small growths are found.  Osteoporosis is a disease in which the bones lose minerals and strength with aging. This can result in serious bone fractures or breaks. The risk of osteoporosis can be identified using a bone density scan. Women ages 59 years and over and women at risk for fractures or osteoporosis should discuss screening with their health care providers. Ask  your health care provider whether you should take a calcium supplement or vitamin D to reduce the rate of osteoporosis.  Menopause can be associated with physical symptoms and risks. Hormone replacement therapy is available to decrease symptoms and risks. You should talk to your health care provider about whether hormone replacement therapy is right for you.  Use sunscreen. Apply sunscreen liberally and repeatedly throughout the day. You should seek shade when your shadow is shorter than you. Protect yourself by wearing long sleeves, pants, a wide-brimmed hat, and sunglasses year round, whenever you are outdoors.  Once a month, do a whole body skin exam, using a mirror to look at the skin on your back. Tell your health care provider of new moles, moles that have irregular borders, moles that are larger than a pencil eraser, or moles that have changed in shape or color.  Stay current with required vaccines (immunizations).  Influenza vaccine. All adults should be immunized every year.  Tetanus, diphtheria, and acellular pertussis (Td, Tdap) vaccine. Pregnant women should receive 1 dose of Tdap vaccine during each pregnancy. The dose should be obtained regardless of the length of time since the last dose. Immunization is preferred during the 27th-36th week of gestation. An adult who has not previously received Tdap or who does not know her vaccine status should receive 1 dose of Tdap. This initial dose should be followed by tetanus and diphtheria toxoids (Td) booster doses every 10 years. Adults with an unknown or incomplete history of completing a 3-dose immunization series with Td-containing vaccines should begin or complete a primary immunization series including a Tdap dose. Adults should receive a Td booster every 10 years.    Zoster vaccine. One dose is recommended for adults aged 10 years or older unless certain conditions are present.    Pneumococcal 13-valent conjugate (PCV13) vaccine.  When indicated, a person who is uncertain of her immunization history and has no record of immunization should receive the PCV13 vaccine. An adult aged 19 years or older who has certain medical conditions and has not been previously immunized should receive 1 dose of PCV13 vaccine. This PCV13 should be followed with a dose of pneumococcal polysaccharide (PPSV23) vaccine. The PPSV23 vaccine dose should be obtained at least 1 or more  year(s) after the dose of PCV13 vaccine. An adult aged 60 years or older who has certain medical conditions and previously received 1 or more doses of PPSV23 vaccine should receive 1 dose of PCV13. The PCV13 vaccine dose should be obtained 1 or more years after the last PPSV23 vaccine dose.    Pneumococcal polysaccharide (PPSV23) vaccine. When PCV13 is also indicated, PCV13 should be obtained first. All adults aged 20 years and older should be immunized. An adult younger than age 58 years who has certain medical conditions should be immunized. Any person who resides in a nursing home or long-term care facility should be immunized. An adult smoker should be immunized. People with an immunocompromised condition and certain other conditions should receive both PCV13 and PPSV23 vaccines. People with human immunodeficiency virus (HIV) infection should be immunized as soon as possible after diagnosis. Immunization during chemotherapy or radiation therapy should be avoided. Routine use of PPSV23 vaccine is not recommended for American Indians, [place]atives, or people younger than 65 years unless there are medical conditions that require PPSV23 vaccine. When indicated, people who have unknown immunization and have no record of immunization should receive PPSV23 vaccine. One-time revaccination 5 years after the first dose of PPSV23 is recommended for people aged 19-64 years who have chronic kidney failure, nephrotic syndrome, asplenia, or immunocompromised conditions. People who received 1-2  doses of PPSV23 before age 48 years should receive another dose of PPSV23 vaccine at age 91 years or later if at least 5 years have passed since the previous dose. Doses of PPSV23 are not needed for people immunized with PPSV23 at or after age 75 years.   Preventive Services / Frequency  Ages 7 years and over  Blood pressure check.  Lipid and cholesterol check.  Lung cancer screening. / Every year if you are aged 30-80 years and have a 30-pack-year history of smoking and currently smoke or have quit within the past 15 years. Yearly screening is stopped once you have quit smoking for at least 15 years or develop a health problem that would prevent you from having lung cancer treatment.  Clinical breast exam.** / Every year after age 25 years.   BRCA-related cancer risk assessment.** / For women who have family members with a BRCA-related cancer (breast, ovarian, tubal, or peritoneal cancers).  Mammogram.** / Every year beginning at age 24 years and continuing for as long as you are in good health. Consult with your health care provider.  Pap test.** / Every 3 years starting at age 48 years through age 73 or 16 years with 3 consecutive normal Pap tests. Testing can be stopped between 65 and 70 years with 3 consecutive normal Pap tests and no abnormal Pap or HPV tests in the past 10 years.  Fecal occult blood test (FOBT) of stool. / Every year beginning at age 65 years and continuing until age 39 years. You may not need to do this test if you get a colonoscopy every 10 years.  Flexible sigmoidoscopy or colonoscopy.** / Every 5 years for a flexible sigmoidoscopy or every 10 years for a colonoscopy beginning at age 89 years and continuing until age 14 years.  Hepatitis C blood test.** / For all people born from [birth year] through 1964 and any individual with known risks for hepatitis C.  Osteoporosis screening.** / A one-time screening for women ages 36 years and over and women at risk for fractures  or osteoporosis.  Skin self-exam. / Monthly.  Influenza vaccine. / Every year.  Tetanus, diphtheria, and acellular pertussis (Tdap/Td) vaccine.** / 1 dose of Td every 10 years.  Zoster vaccine.** / 1 dose for adults aged 37 years or older.  Pneumococcal 13-valent conjugate (PCV13) vaccine.** / Consult your health care provider.  Pneumococcal polysaccharide (PPSV23) vaccine.** / 1 dose for all adults aged 81 years and older. Screening for abdominal aortic aneurysm (AAA)  by ultrasound is recommended for people who have history of high blood pressure or who are current or former smokers. ++++++++++++++++++++ Recommend Adult Low Dose Aspirin or  coated  Aspirin 81 mg daily  To reduce risk of Colon Cancer 20 %,  Skin Cancer 26 % ,  Melanoma 46%  and  Pancreatic cancer 60% ++++++++++++++++++++ Vitamin D goal  is between 70-100.  Please make sure that you are taking your Vitamin D as directed.  It is very important as a natural anti-inflammatory  helping hair, skin, and nails, as well as reducing stroke and heart attack risk.  It helps your bones and helps with mood. It also decreases numerous cancer risks so please take it as directed.  Low Vit D is associated with a 200-300% higher risk for CANCER  and 200-300% higher risk for HEART   ATTACK  &  STROKE.   .....................................Marland Kitchen It is also associated with higher death rate at younger ages,  autoimmune diseases like Rheumatoid arthritis, Lupus, Multiple Sclerosis.    Also many other serious conditions, like depression, Alzheimer's Dementia, infertility, muscle aches, fatigue, fibromyalgia - just to name a few. ++++++++++++++++++ Recommend the book "The END of DIETING" by Dr Excell Seltzer  & the book "The END of DIABETES " by Dr Excell Seltzer At [email or web address] - get book & Audio CD's    Being diabetic has a  300% increased risk for heart attack, stroke, cancer, and alzheimer- type vascular dementia. It is very important  that you work harder with diet by avoiding all foods that are white. Avoid white rice (brown & wild rice is OK), white potatoes (sweetpotatoes in moderation is OK), White bread or wheat bread or anything made out of white flour like bagels, donuts, rolls, buns, biscuits, cakes, pastries, cookies, pizza crust, and pasta (made from white flour & egg whites) - vegetarian pasta or spinach or wheat pasta is OK. Multigrain breads like Arnold's or Pepperidge [employer], or multigrain sandwich thins or flatbreads.  Diet, exercise and weight loss can reverse and cure diabetes in the early stages.  Diet, exercise and weight loss is very important in the control and prevention of complications of diabetes which affects every system in your body, ie. Brain - dementia/stroke, eyes - glaucoma/blindness, heart - heart attack/heart failure, kidneys - dialysis, stomach - gastric paralysis, intestines - malabsorption, nerves - severe painful neuritis, circulation - gangrene & loss of a leg(s), and finally cancer and Alzheimers.    I recommend avoid fried & greasy foods,  sweets/candy, white rice (brown or wild rice or Quinoa is OK), white potatoes (sweet potatoes are OK) - anything made from white flour - bagels, doughnuts, rolls, buns, biscuits,white and wheat breads, pizza crust and traditional pasta made of white flour & egg white(vegetarian pasta or spinach or wheat pasta is OK).  Multi-grain bread is OK - like multi-grain flat bread or sandwich thins. Avoid alcohol in excess. Exercise is also important.    Eat all the vegetables you want - avoid meat, especially red meat and dairy - especially cheese.  Cheese is the most concentrated form of trans-fats which is the worst  thing to clog up our arteries. Veggie cheese is OK which can be found in the fresh produce section at [employer] or [employer] or [employer]  +++++++++++++++++++ DASH Eating Plan  DASH stands for "Dietary Approaches to Stop Hypertension."   The DASH  eating plan is a healthy eating plan that has been shown to reduce high blood pressure (hypertension). Additional health benefits may include reducing the risk of type 2 diabetes mellitus, heart disease, and stroke. The DASH eating plan may also help with weight loss. WHAT DO I NEED TO KNOW ABOUT THE DASH EATING PLAN? For the DASH eating plan, you will follow these general guidelines:  Choose foods with a percent daily value for sodium of less than 5% (as listed on the food label).  Use salt-free seasonings or herbs instead of table salt or sea salt.  Check with your health care provider or pharmacist before using salt substitutes.  Eat lower-sodium products, often labeled as "lower sodium" or "no salt added."  Eat fresh foods.  Eat more vegetables, fruits, and low-fat dairy products.  Choose whole grains. Look for the word "whole" as the first word in the ingredient list.  Choose fish   Limit sweets, desserts, sugars, and sugary drinks.  Choose heart-healthy fats.  Eat veggie cheese   Eat more home-cooked food and less restaurant, buffet, and fast food.  Limit fried foods.  Cook foods using methods other than frying.  Limit canned vegetables. If you do use them, rinse them well to decrease the sodium.  When eating at a restaurant, ask that your food be prepared with less salt, or no salt if possible.                      WHAT FOODS CAN I EAT? Read Dr Fara Olden Fuhrman's books on The End of Dieting & The End of Diabetes  Grains Whole grain or whole wheat bread. Brown rice. Whole grain or whole wheat pasta. Quinoa, bulgur, and whole grain cereals. Low-sodium cereals. Corn or whole wheat flour tortillas. Whole grain cornbread. Whole grain crackers. Low-sodium crackers.  Vegetables Fresh or frozen vegetables (raw, steamed, roasted, or grilled). Low-sodium or reduced-sodium tomato and vegetable juices. Low-sodium or reduced-sodium tomato sauce and paste. Low-sodium or reduced-sodium  canned vegetables.   Fruits All fresh, canned (in natural juice), or frozen fruits.  Protein Products  All fish and seafood.  Dried beans, peas, or lentils. Unsalted nuts and seeds. Unsalted canned beans.  Dairy Low-fat dairy products, such as skim or 1% milk, 2% or reduced-fat cheeses, low-fat ricotta or cottage cheese, or plain low-fat yogurt. Low-sodium or reduced-sodium cheeses.  Fats and Oils Tub margarines without trans fats. Light or reduced-fat mayonnaise and salad dressings (reduced sodium). Avocado. Safflower, olive, or canola oils. Natural peanut or almond butter.  Other Unsalted popcorn and pretzels. The items listed above may not be a complete list of recommended foods or beverages. Contact your dietitian for more options.  +++++++++++++++  WHAT FOODS ARE NOT RECOMMENDED? Grains/ White flour or wheat flour White bread. White pasta. White rice. Refined cornbread. Bagels and croissants. Crackers that contain trans fat.  Vegetables  Creamed or fried vegetables. Vegetables in a . Regular canned vegetables. Regular canned tomato sauce and paste. Regular tomato and vegetable juices.  Fruits Dried fruits. Canned fruit in light or heavy syrup. Fruit juice.  Meat and Other Protein Products Meat in general - RED meat & White meat.  Fatty cuts of meat. Ribs, chicken wings, all  processed meats as bacon, sausage, bologna, salami, fatback, hot dogs, bratwurst and packaged luncheon meats.  Dairy Whole or 2% milk, cream, half-and-half, and cream cheese. Whole-fat or sweetened yogurt. Full-fat cheeses or blue cheese. Non-dairy creamers and whipped toppings. Processed cheese, cheese spreads, or cheese curds.  Condiments Onion and garlic salt, seasoned salt, table salt, and sea salt. Canned and packaged gravies. Worcestershire sauce. Tartar sauce. Barbecue sauce. Teriyaki sauce. Soy sauce, including reduced sodium. Steak sauce. Fish sauce. Oyster sauce. Cocktail sauce. Horseradish.  Ketchup and mustard. Meat flavorings and tenderizers. Bouillon cubes. Hot sauce. Tabasco sauce. Marinades. Taco seasonings. Relishes.  Fats and Oils Butter, stick margarine, lard, shortening and bacon fat. Coconut, palm kernel, or palm oils. Regular salad dressings.  Pickles and olives. Salted popcorn and pretzels.  The items listed above may not be a complete list of foods and beverages to avoid.
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
## 2018-04-16 ENCOUNTER — Other Ambulatory Visit: Payer: Self-pay | Admitting: Internal Medicine

## 2018-04-16 LAB — COMPLETE METABOLIC PANEL WITH GFR
AG Ratio: 1.5 (calc) (ref 1.0–2.5)
ALT: 27 U/L (ref 6–29)
AST: 18 U/L (ref 10–35)
Albumin: 3.8 g/dL (ref 3.6–5.1)
Alkaline phosphatase (APISO): 51 U/L (ref 33–130)
BUN/Creatinine Ratio: 12 (calc) (ref 6–22)
BUN: 23 mg/dL (ref 7–25)
CO2: 31 mmol/L (ref 20–32)
Calcium: 10.9 mg/dL — ABNORMAL HIGH (ref 8.6–10.4)
Chloride: 97 mmol/L — ABNORMAL LOW (ref 98–110)
Creat: 1.92 mg/dL — ABNORMAL HIGH (ref 0.60–0.93)
GFR, Est African American: 30 mL/min/{1.73_m2} — ABNORMAL LOW (ref >=60)
GFR, Est Non African American: 26 mL/min/{1.73_m2} — ABNORMAL LOW (ref >=60)
Globulin: 2.5 g/dL (calc) (ref 1.9–3.7)
Glucose, Bld: 282 mg/dL — ABNORMAL HIGH (ref 65–99)
Potassium: 4.7 mmol/L (ref 3.5–5.3)
Sodium: 138 mmol/L (ref 135–146)
Total Bilirubin: 0.4 mg/dL (ref 0.2–1.2)
Total Protein: 6.3 g/dL (ref 6.1–8.1)

## 2018-04-16 LAB — URINALYSIS, ROUTINE W REFLEX MICROSCOPIC
Bacteria, UA: NONE SEEN /HPF
Bilirubin Urine: NEGATIVE
Hyaline Cast: NONE SEEN /LPF
Ketones, ur: NEGATIVE
Nitrite: NEGATIVE
Specific Gravity, Urine: 1.017 (ref 1.001–1.03)
pH: 6.5 (ref 5.0–8.0)

## 2018-04-16 LAB — LIPID PANEL
Cholesterol: 217 mg/dL — ABNORMAL HIGH (ref <200)
HDL: 47 mg/dL — ABNORMAL LOW (ref >50)
LDL Cholesterol (Calc): 124 mg/dL (calc) — ABNORMAL HIGH
Non-HDL Cholesterol (Calc): 170 mg/dL (calc) — ABNORMAL HIGH (ref <130)
Total CHOL/HDL Ratio: 4.6 (calc) (ref <5.0)
Triglycerides: 325 mg/dL — ABNORMAL HIGH (ref <150)

## 2018-04-16 LAB — CBC WITH DIFFERENTIAL/PLATELET
Basophils Absolute: 29 cells/uL (ref 0–200)
Basophils Relative: 0.4 %
Eosinophils Absolute: 86 cells/uL (ref 15–500)
Eosinophils Relative: 1.2 %
HCT: 38.1 % (ref 35.0–45.0)
Hemoglobin: 12.6 g/dL (ref 11.7–15.5)
Lymphs Abs: 1555 cells/uL (ref 850–3900)
MCH: 28.6 pg (ref 27.0–33.0)
MCHC: 33.1 g/dL (ref 32.0–36.0)
MCV: 86.6 fL (ref 80.0–100.0)
MPV: 10.5 fL (ref 7.5–12.5)
Monocytes Relative: 8.3 %
Neutro Abs: 4932 cells/uL (ref 1500–7800)
Neutrophils Relative %: 68.5 %
Platelets: 300 10*3/uL (ref 140–400)
RBC: 4.4 10*6/uL (ref 3.80–5.10)
RDW: 12.7 % (ref 11.0–15.0)
Total Lymphocyte: 21.6 %
WBC mixed population: 598 cells/uL (ref 200–950)
WBC: 7.2 10*3/uL (ref 3.8–10.8)

## 2018-04-16 LAB — MAGNESIUM: Magnesium: 1.2 mg/dL — ABNORMAL LOW (ref 1.5–2.5)

## 2018-04-16 LAB — MICROALBUMIN / CREATININE URINE RATIO
Creatinine, Urine: 83 mg/dL (ref 20–275)
Microalb Creat Ratio: 4276 mcg/mg creat — ABNORMAL HIGH (ref <30)
Microalb, Ur: 354.9 mg/dL

## 2018-04-16 LAB — HEMOGLOBIN A1C
Hgb A1c MFr Bld: 8.7 % of total Hgb — ABNORMAL HIGH (ref <5.7)
Mean Plasma Glucose: 203 (calc)
eAG (mmol/L): 11.2 (calc)

## 2018-04-16 LAB — URIC ACID: Uric Acid, Serum: 5.5 mg/dL (ref 2.5–7.0)

## 2018-04-16 LAB — TSH: TSH: 2.53 mIU/L (ref 0.40–4.50)

## 2018-04-16 LAB — VITAMIN D 25 HYDROXY (VIT D DEFICIENCY, FRACTURES): Vit D, 25-Hydroxy: 59 ng/mL (ref 30–100)

## 2018-04-16 MED ORDER — CIPROFLOXACIN HCL 500 MG PO TABS: ORAL_TABLET | ORAL | 0 refills | Status: DC

## 2018-04-17 ENCOUNTER — Other Ambulatory Visit: Payer: Self-pay | Admitting: Internal Medicine

## 2018-04-17 DIAGNOSIS — N179 Acute kidney failure, unspecified: Secondary | ICD-10-CM

## 2018-04-17 MED ORDER — GLIPIZIDE 5 MG PO TABS: ORAL_TABLET | ORAL | 1 refills | Status: DC

## 2018-04-19 ENCOUNTER — Encounter: Payer: Self-pay | Admitting: Internal Medicine

## 2018-04-20 ENCOUNTER — Encounter: Payer: Self-pay | Admitting: *Deleted

## 2018-05-05 ENCOUNTER — Other Ambulatory Visit: Payer: Self-pay | Admitting: Internal Medicine

## 2018-05-05 ENCOUNTER — Ambulatory Visit (INDEPENDENT_AMBULATORY_CARE_PROVIDER_SITE_OTHER): Payer: PPO

## 2018-05-05 DIAGNOSIS — N390 Urinary tract infection, site not specified: Secondary | ICD-10-CM

## 2018-05-05 DIAGNOSIS — Z79899 Other long term (current) drug therapy: Secondary | ICD-10-CM | POA: Diagnosis not present

## 2018-05-05 LAB — BASIC METABOLIC PANEL WITH GFR
BUN/Creatinine Ratio: 14 (calc) (ref 6–22)
BUN: 21 mg/dL (ref 7–25)
CO2: 25 mmol/L (ref 20–32)
Calcium: 10.2 mg/dL (ref 8.6–10.4)
Chloride: 100 mmol/L (ref 98–110)
Creat: 1.49 mg/dL — ABNORMAL HIGH (ref 0.60–0.93)
GFR, Est African American: 41 mL/min/{1.73_m2} — ABNORMAL LOW (ref >=60)
GFR, Est Non African American: 35 mL/min/{1.73_m2} — ABNORMAL LOW (ref >=60)
Glucose, Bld: 112 mg/dL — ABNORMAL HIGH (ref 65–99)
Potassium: 4.5 mmol/L (ref 3.5–5.3)
Sodium: 137 mmol/L (ref 135–146)

## 2018-05-05 NOTE — Progress Notes (Signed)
Patient presents to the office for a nurse visit to have labs done to check kidney function. Labs and urine collected at time of visit.

## 2018-05-06 ENCOUNTER — Ambulatory Visit: Payer: Self-pay

## 2018-05-06 ENCOUNTER — Other Ambulatory Visit: Payer: Self-pay | Admitting: Internal Medicine

## 2018-05-06 DIAGNOSIS — N183 Chronic kidney disease, stage 3 unspecified: Secondary | ICD-10-CM

## 2018-05-06 DIAGNOSIS — Z794 Long term (current) use of insulin: Secondary | ICD-10-CM

## 2018-05-06 DIAGNOSIS — E1122 Type 2 diabetes mellitus with diabetic chronic kidney disease: Secondary | ICD-10-CM

## 2018-05-06 LAB — URINALYSIS, ROUTINE W REFLEX MICROSCOPIC
Bacteria, UA: NONE SEEN /HPF
Bilirubin Urine: NEGATIVE
Glucose, UA: NEGATIVE
Hyaline Cast: NONE SEEN /LPF
Ketones, ur: NEGATIVE
Leukocytes, UA: NEGATIVE
Nitrite: NEGATIVE
RBC / HPF: NONE SEEN /HPF (ref 0–2)
Specific Gravity, Urine: 1.012 (ref 1.001–1.03)
Squamous Epithelial / LPF: NONE SEEN /HPF (ref <=5)
pH: 5.5 (ref 5.0–8.0)

## 2018-05-06 LAB — URINE CULTURE
MICRO NUMBER:: 91121604
SPECIMEN QUALITY:: ADEQUATE

## 2018-05-18 ENCOUNTER — Ambulatory Visit (INDEPENDENT_AMBULATORY_CARE_PROVIDER_SITE_OTHER): Payer: PPO | Admitting: Internal Medicine

## 2018-05-18 VITALS — BP 134/84 | HR 88 | Temp 97.3°F | Resp 16 | Ht 63.5 in | Wt 168.8 lb

## 2018-05-18 DIAGNOSIS — N183 Chronic kidney disease, stage 3 unspecified: Secondary | ICD-10-CM

## 2018-05-18 DIAGNOSIS — Z79899 Other long term (current) drug therapy: Secondary | ICD-10-CM

## 2018-05-18 DIAGNOSIS — I10 Essential (primary) hypertension: Secondary | ICD-10-CM

## 2018-05-18 DIAGNOSIS — E1122 Type 2 diabetes mellitus with diabetic chronic kidney disease: Secondary | ICD-10-CM

## 2018-05-18 DIAGNOSIS — Z794 Long term (current) use of insulin: Secondary | ICD-10-CM | POA: Diagnosis not present

## 2018-05-18 LAB — COMPLETE METABOLIC PANEL WITH GFR
AG Ratio: 1.7 (calc) (ref 1.0–2.5)
ALT: 13 U/L (ref 6–29)
AST: 13 U/L (ref 10–35)
Albumin: 3.6 g/dL (ref 3.6–5.1)
Alkaline phosphatase (APISO): 51 U/L (ref 33–130)
BUN/Creatinine Ratio: 16 (calc) (ref 6–22)
BUN: 23 mg/dL (ref 7–25)
CO2: 24 mmol/L (ref 20–32)
Calcium: 10 mg/dL (ref 8.6–10.4)
Chloride: 98 mmol/L (ref 98–110)
Creat: 1.43 mg/dL — ABNORMAL HIGH (ref 0.60–0.93)
GFR, Est African American: 43 mL/min/{1.73_m2} — ABNORMAL LOW (ref >=60)
GFR, Est Non African American: 37 mL/min/{1.73_m2} — ABNORMAL LOW (ref >=60)
Globulin: 2.1 g/dL (calc) (ref 1.9–3.7)
Glucose, Bld: 244 mg/dL — ABNORMAL HIGH (ref 65–99)
Potassium: 3.9 mmol/L (ref 3.5–5.3)
Sodium: 137 mmol/L (ref 135–146)
Total Bilirubin: 0.4 mg/dL (ref 0.2–1.2)
Total Protein: 5.7 g/dL — ABNORMAL LOW (ref 6.1–8.1)

## 2018-05-18 NOTE — Patient Instructions (Signed)
Chronic Kidney Disease, Adult Chronic kidney disease (CKD) occurs when the kidneys become damaged slowly over a long period of time. The kidneys are a pair of organs that do many important jobs in the body, including:  Removing waste and extra fluid from the blood to make urine.  Making hormones that maintain the amount of fluid in tissues and blood vessels.  Maintaining the right amount of fluids and chemicals in the body.  A small amount of kidney damage may not cause problems, but a large amount of damage may make it hard or impossible for the kidneys to work the way they should. If steps are not taken to slow down kidney damage or to stop it from getting worse, the kidneys may stop working permanently (end-stage renal disease or ESRD). Most of the time, CKD does not go away, but it can often be controlled. People who have CKD are usually able to live normal lives. What are the causes? The most common causes of this condition are diabetes and high blood pressure (hypertension). Other causes include:  Heart and blood vessel (cardiovascular) disease.  Kidney diseases, such as: ? Glomerulonephritis. ? Interstitial nephritis. ? Polycystic kidney disease. ? Renal vascular disease.  Diseases that affect the immune system.  Genetic diseases.  Medicines that damage the kidneys, such as anti-inflammatory medicines.  Being around or being in contact with poisonous (toxic) substances.  A kidney or urinary infection that occurs again and again (recurs).  Vasculitis. This is swelling or inflammation of the blood vessels.  A problem with urine flow that may be caused by: ? Cancer. ? Having kidney stones more than one time. ? An enlarged prostate, in males.  What increases the risk? You are more likely to develop this condition if you:  Are older than age 60.  Are female.  Are African-American, Hispanic, Asian, Pacific Islander, or American Indian.  Are a current or former  smoker.  Are obese.  Have a family history of kidney disease or failure.  Often take medicines that are damaging to the kidneys.  What are the signs or symptoms? Symptoms of this condition include:  Swelling (edema) of the face, legs, ankles, or feet.  Tiredness (lethargy) and having less energy.  Nausea or vomiting.  Confusion or trouble concentrating.  Problems with urination, such as: ? Painful or burning feeling during urination. ? Decreased urine production. ? Frequent urination, especially at night. ? Bloody urine.  Muscle twitches and cramps, especially in the legs.  Shortness of breath.  Weakness.  Loss of appetite.  Metallic taste in the mouth.  Trouble sleeping.  Dry, itchy skin.  A low blood count (anemia).  Pale lining of the eyelids and surface of the eye (conjunctiva).  Symptoms develop slowly and may not be obvious until the kidney damage becomes severe. It is possible to have kidney disease for years without having any symptoms. How is this diagnosed? This condition may be diagnosed based on:  Blood tests.  Urine tests.  Imaging tests, such as an ultrasound or CT scan.  A test in which a sample of tissue is removed from the kidneys to be examined under a microscope (kidney biopsy).  These test results will help your health care provider determine how serious the CKD is. How is this treated? There is no cure for most cases of this condition, but treatment usually relieves symptoms and prevents or slows the progression of the disease. Treatment may include:  Making diet changes, which may require you to   avoid alcohol, salty foods (sodium), and foods that are high in potassium, calcium, and protein.  Medicines: ? To lower blood pressure. ? To control blood glucose. ? To relieve anemia. ? To relieve swelling. ? To protect your bones. ? To improve the balance of electrolytes in your blood.  Removing toxic waste from the body through  types of dialysis, if the kidneys can no longer do their job (kidney failure).  Managing any other conditions that are causing your CKD or making it worse.  Follow these instructions at home: Medicines  Take over-the-counter and prescription medicines only as told by your health care provider. The dose of some medicines that you take may need to be adjusted.  Do not take any new medicines unless approved by your health care provider. Many medicines can worsen your kidney damage.  Do not take any vitamin and mineral supplements unless approved by your health care provider. Many nutritional supplements can worsen your kidney damage. General instructions  Follow your prescribed diet as told by your health care provider.  Do not use any products that contain nicotine or tobacco, such as cigarettes and e-cigarettes. If you need help quitting, ask your health care provider.  Monitor and track your blood pressure at home. Report changes in your blood pressure as told by your health care provider.  If you are being treated for diabetes, monitor and track your blood sugar (blood glucose) levels as told by your health care provider.  Maintain a healthy weight. If you need help with this, ask your health care provider.  Start or continue an exercise plan. Exercise at least 30 minutes a day, 5 days a week.  Keep your immunizations up to date as told by your health care provider.  Keep all follow-up visits as told by your health care provider. This is important. Where to find more information:  American Association of Kidney Patients: BombTimer.gl  National Kidney Foundation: www.kidney.[place]: https://mathis.com/  [facility]: www.lifeoptions.org and www.kidneyschool.org Contact a health care provider if:  Your symptoms get worse.  You develop new symptoms. Get help right away if:  You develop symptoms of ESRD, which  include: ? Headaches. ? Numbness in the hands or feet. ? Easy bruising. ? Frequent hiccups. ? Chest pain. ? Shortness of breath. ? Lack of menstruation, in women.  You have a fever.  You have decreased urine production.  You have pain or bleeding when you urinate. Summary  Chronic kidney disease (CKD) occurs when the kidneys become damaged slowly over a long period of time.  The most common causes of this condition are diabetes and high blood pressure (hypertension).  There is no cure for most cases of this condition, but treatment usually relieves symptoms and prevents or slows the progression of the disease. Treatment may include a combination of medicines and lifestyle changes. ++++++++++++++++++++++++++++++++  Food Basics for Chronic Kidney Disease When your kidneys are not working well, they cannot remove waste and excess substances from your blood as effectively as they did before. This can lead to a buildup and imbalance of these substances, which can worsen kidney damage and affect how your body functions. Certain foods lead to a buildup of these substances in the body. By changing your diet as recommended by your diet and nutrition specialist (dietitian) or health care provider, you could help prevent further kidney damage and delay or prevent the need for dialysis. What are tips for following this plan? General instructions  Work  with your health care provider and dietitian to develop a meal plan that is right for you. Foods you can eat, limit, or avoid will be different for each person depending on the stage of kidney disease and any other existing health conditions.  Talk with your health care provider about whether you should take a vitamin and mineral supplement.  Use standard measuring cups and spoons to measure servings of foods. Use a kitchen scale to measure portions of protein foods.  If directed by your health care provider, avoid drinking too much fluid. Measure  and count all liquids, including water, ice, soups, flavored gelatin, and frozen desserts such as popsicles or ice cream. Reading food labels  Check the amount of sodium in foods. Choose foods that have less than 300 milligrams (mg) per serving.  Check the ingredient list for phosphorus or potassium-based additives or preservatives.  Check the amount of saturated and trans fat. Limit or avoid these fats as told by your dietitian. Shopping  Avoid buying foods that are: ? Processed, frozen, or prepackaged. ? Calcium-enriched or fortified.  Do not buy foods that have salt or sodium listed among the first five ingredients.  Do not buy canned vegetables. Cooking  Replace animal proteins, such as meat, fish, eggs, or dairy, with plant proteins from beans, nuts, and soy. ? Use soy milk instead of cow's milk. ? Add beans or tofu to soups, casseroles, or pasta dishes instead of meat.  Soak vegetables, such as potatoes, before cooking to reduce potassium. To do this: ? Peel and cut into small pieces. ? Soak in warm water for at least 2 hours. For every 1 cup of vegetables, use 10 cups of water. ? Drain and rinse with warm water. ? Boil for at least 5 minutes. Meal planning  Limit the amount of protein from plant and animal sources you eat each day.  Do not add salt to food when cooking or before eating.  Eat meals and snacks at around the same time each day. If you have diabetes:  If you have diabetes (diabetes mellitus) and chronic kidney disease, it is important to keep your blood glucose in the target range recommended by your health care provider. Follow your diabetes management plan. This may include: ? Checking your blood glucose regularly. ? Taking oral medicines, insulin, or both. ? Exercising for at least 30 minutes on 5 or more days each week, or as told by your health care provider. ? Tracking how many servings of carbohydrates you eat at each meal.  You may be given  specific guidelines on how much of certain foods and nutrients you may eat, depending on your stage of kidney disease and whether you have high blood pressure (hypertension). Follow your meal plan as told by your dietitian. What nutrients should be limited? The items listed are not a complete list. Talk with your dietitian about what dietary choices are best for you. Potassium Potassium affects how steadily your heart beats. If too much potassium builds up in your blood, it can cause an irregular heartbeat or even a heart attack. You may need to eat less potassium, depending on your blood potassium levels and the stage of kidney disease. Talk to your dietitian about how much potassium you may have each day. You may need to limit or avoid foods that are high in potassium, such as:  Milk and soy milk.  Fruits, such as bananas, papaya, apricots, nectarines, melon, prunes, raisins, kiwi, and oranges.  Vegetables, such as  potatoes, sweet potatoes, yams, tomatoes, leafy greens, beets, okra, avocado, pumpkin, and winter squash.  White and lima beans.  Phosphorus Phosphorus is a mineral found in your bones. A balance between calcium and phosphorous is needed to build and maintain healthy bones. Too much phosphorus pulls calcium from your bones. This can make your bones weak and more likely to break. Too much phosphorus can also make your skin itch. You may need to eat less phosphorus depending on your blood phosphorus levels and the stage of kidney disease. Talk to your dietitian about how much potassium you may have each day. You may need to take medicine to lower your blood phosphorus levels if diet changes do not help. You may need to limit or avoid foods that are high in phosphorus, such as:  Milk and dairy products.  Dried beans and peas.  Tofu, soy milk, and other soy-based meat replacements.  Colas.  Nuts and peanut butter.  Meat, poultry, and fish.  Bran cereals and  oatmeals.  Protein Protein helps you to make and keep muscle. It also helps in the repair of your body's cells and tissues. One of the natural breakdown products of protein is a waste product called urea. When your kidneys are not working properly, they cannot remove wastes, such as urea, like they did before you developed chronic kidney disease. Reducing how much protein you eat can help prevent a buildup of urea in your blood. Depending on your stage of kidney disease, you may need to limit foods that are high in protein. Sources of animal protein include:  Meat (all types).  Fish and seafood.  Poultry.  Eggs.  Dairy.  Other protein foods include:  Beans and legumes.  Nuts and nut butter.  Soy and tofu.  Sodium Sodium, which is found in salt, helps maintain a healthy balance of fluids in your body. Too much sodium can increase your blood pressure and have a negative effect on the function of your heart and lungs. Too much sodium can also cause your body to retain too much fluid, making your kidneys work harder. Most people should have less than 2,300 milligrams (mg) of sodium each day. If you have hypertension, you may need to limit your sodium to 1,500 mg each day. Talk to your dietitian about how much sodium you may have each day. You may need to limit or avoid foods that are high in sodium, such as:  Salt seasonings.  Soy sauce.  Cured and processed meats.  Salted crackers and snack foods.  Fast food.  Canned soups and most canned foods.  Pickled foods.  Vegetable juice.  Boxed mixes or ready-to-eat boxed meals and side dishes.  Bottled dressings, sauces, and marinades.  Summary  Chronic kidney disease can lead to a buildup and imbalance of waste and excess substances in the body. Certain foods lead to a buildup of these substances. By adjusting your intake of these foods, you could help prevent more kidney damage and delay or prevent the need for  dialysis.  Food adjustments are different for each person with chronic kidney disease. Work with a dietitian to set up nutrient goals and a meal plan that is right for you.  If you have diabetes and chronic kidney disease, it is important to keep your blood glucose in the target range recommended by your health care provider. This information is not intended to replace advice given to you by your health care provider. Make sure you discuss any questions you have  with your health care provider. Document Released: 10/25/2002 Document Revised: 07/30/2016 Document Reviewed: 07/30/2016 Elsevier Interactive Patient Education  2018 [place] for Chronic Kidney Disease When your kidneys are not working well, they cannot remove waste and excess substances from your blood as effectively as they did before. This can lead to a buildup and imbalance of these substances, which can worsen kidney damage and affect how your body functions. Certain foods lead to a buildup of these substances in the body. By changing your diet as recommended by your diet and nutrition specialist (dietitian) or health care provider, you could help prevent further kidney damage and delay or prevent the need for dialysis. What are tips for following this plan? General instructions  Work with your health care provider and dietitian to develop a meal plan that is right for you. Foods you can eat, limit, or avoid will be different for each person depending on the stage of kidney disease and any other existing health conditions.  Talk with your health care provider about whether you should take a vitamin and mineral supplement.  Use standard measuring cups and spoons to measure servings of foods. Use a kitchen scale to measure portions of protein foods.  If directed by your health care provider, avoid drinking too much fluid. Measure and count all liquids, including water, ice, soups, flavored gelatin, and frozen  desserts such as popsicles or ice cream. Reading food labels  Check the amount of sodium in foods. Choose foods that have less than 300 milligrams (mg) per serving.  Check the ingredient list for phosphorus or potassium-based additives or preservatives.  Check the amount of saturated and trans fat. Limit or avoid these fats as told by your dietitian. Shopping  Avoid buying foods that are: ? Processed, frozen, or prepackaged. ? Calcium-enriched or fortified.  Do not buy foods that have salt or sodium listed among the first five ingredients.  Do not buy canned vegetables. Cooking  Replace animal proteins, such as meat, fish, eggs, or dairy, with plant proteins from beans, nuts, and soy. ? Use soy milk instead of cow's milk. ? Add beans or tofu to soups, casseroles, or pasta dishes instead of meat.  Soak vegetables, such as potatoes, before cooking to reduce potassium. To do this: ? Peel and cut into small pieces. ? Soak in warm water for at least 2 hours. For every 1 cup of vegetables, use 10 cups of water. ? Drain and rinse with warm water. ? Boil for at least 5 minutes. Meal planning  Limit the amount of protein from plant and animal sources you eat each day.  Do not add salt to food when cooking or before eating.  Eat meals and snacks at around the same time each day. If you have diabetes:  If you have diabetes (diabetes mellitus) and chronic kidney disease, it is important to keep your blood glucose in the target range recommended by your health care provider. Follow your diabetes management plan. This may include: ? Checking your blood glucose regularly. ? Taking oral medicines, insulin, or both. ? Exercising for at least 30 minutes on 5 or more days each week, or as told by your health care provider. ? Tracking how many servings of carbohydrates you eat at each meal.  You may be given specific guidelines on how much of certain foods and nutrients you may eat, depending  on your stage of kidney disease and whether you have high blood pressure (hypertension). Follow your  meal plan as told by your dietitian. What nutrients should be limited? The items listed are not a complete list. Talk with your dietitian about what dietary choices are best for you. Potassium Potassium affects how steadily your heart beats. If too much potassium builds up in your blood, it can cause an irregular heartbeat or even a heart attack. You may need to eat less potassium, depending on your blood potassium levels and the stage of kidney disease. Talk to your dietitian about how much potassium you may have each day. You may need to limit or avoid foods that are high in potassium, such as:  Milk and soy milk.  Fruits, such as bananas, papaya, apricots, nectarines, melon, prunes, raisins, kiwi, and oranges.  Vegetables, such as potatoes, sweet potatoes, yams, tomatoes, leafy greens, beets, okra, avocado, pumpkin, and winter squash.  White and lima beans.  Phosphorus Phosphorus is a mineral found in your bones. A balance between calcium and phosphorous is needed to build and maintain healthy bones. Too much phosphorus pulls calcium from your bones. This can make your bones weak and more likely to break. Too much phosphorus can also make your skin itch. You may need to eat less phosphorus depending on your blood phosphorus levels and the stage of kidney disease. Talk to your dietitian about how much potassium you may have each day. You may need to take medicine to lower your blood phosphorus levels if diet changes do not help. You may need to limit or avoid foods that are high in phosphorus, such as:  Milk and dairy products.  Dried beans and peas.  Tofu, soy milk, and other soy-based meat replacements.  Colas.  Nuts and peanut butter.  Meat, poultry, and fish.  Bran cereals and oatmeals.  Protein Protein helps you to make and keep muscle. It also helps in the repair of your  body's cells and tissues. One of the natural breakdown products of protein is a waste product called urea. When your kidneys are not working properly, they cannot remove wastes, such as urea, like they did before you developed chronic kidney disease. Reducing how much protein you eat can help prevent a buildup of urea in your blood. Depending on your stage of kidney disease, you may need to limit foods that are high in protein. Sources of animal protein include:  Meat (all types).  Fish and seafood.  Poultry.  Eggs.  Dairy.  Other protein foods include:  Beans and legumes.  Nuts and nut butter.  Soy and tofu.  Sodium Sodium, which is found in salt, helps maintain a healthy balance of fluids in your body. Too much sodium can increase your blood pressure and have a negative effect on the function of your heart and lungs. Too much sodium can also cause your body to retain too much fluid, making your kidneys work harder. Most people should have less than 2,300 milligrams (mg) of sodium each day. If you have hypertension, you may need to limit your sodium to 1,500 mg each day. Talk to your dietitian about how much sodium you may have each day. You may need to limit or avoid foods that are high in sodium, such as:  Salt seasonings.  Soy sauce.  Cured and processed meats.  Salted crackers and snack foods.  Fast food.  Canned soups and most canned foods.  Pickled foods.  Vegetable juice.  Boxed mixes or ready-to-eat boxed meals and side dishes.  Bottled dressings, sauces, and marinades.  Summary  Chronic  kidney disease can lead to a buildup and imbalance of waste and excess substances in the body. Certain foods lead to a buildup of these substances. By adjusting your intake of these foods, you could help prevent more kidney damage and delay or prevent the need for dialysis.  Food adjustments are different for each person with chronic kidney disease. Work with a dietitian to  set up nutrient goals and a meal plan that is right for you.  If you have diabetes and chronic kidney disease, it is important to keep your blood glucose in the target range recommended by your health care provider. This information is not intended to replace advice given to you by your health care provider. Make sure you discuss any questions you have with your health care provider. Document Released: 10/25/2002 Document Revised: 07/30/2016 Document Reviewed: 07/30/2016 Elsevier Interactive Patient Education  [employer].

## 2018-05-18 NOTE — Progress Notes (Signed)
Patient ID: Ashley [name], female    DOB: 07-03-1946, 71 y.o.   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 Apparently, she remains poor compliant in diet and diabetic monitoring.  Despite this, she denies any sx's of diabetic poly, paresthesias or visual blurring. Her GERD is controlled with her meds.  Patient has Bipolar Manic Depressive Disorder & is followed by Dr [name].  Medication Sig  . aspirin EC 81 MG tablet Take  daily.  Marland Kitchen atorvastatin  40 MG tablet Take 1 tablet daily  . CINNAMON PO Take 1 capsule  daily.  . clonazePAM  1 MG tablet Take 1 mg 2 (two) times daily as needed   . B-12 2500 MCG TABS Take 1 tablet  daily.  . fenofibrate  145 MG tablet Take 1 tablet  daily.  Marland Kitchen FLUoxetine40 MG capsule TAKE ONE CAPSULE  EVERY DAY  . Gabapentin 100 MG caps Take n1 capsule 3 to 4 x /day as needed for Neuropathy Pain  . gabapentin  300 MG caps TAKE ONE CAPSULE  IN THE EVENING  . glipiZIDE  5 MG tablet Take 1 tablet 3 x /day with meals  . hctz 25 MG tablet TAKE 1 TABLET  EVERY OTHER DAY  . ATROVENT 0.06 % nasal spr Use 1 to 2 sprays each nostril 2 to 3 x / day as needed  . levothyroxine  100 MCG TAKE 1 TABLET  EVERY DAY  . losartan  100 MG tablet TAKE 1 TABLET  EVERY DAY   . metFORMIN-XR 500 MG  TAKE 2 TABLETS BY MOUTH TWICE A DAY  . NOVOLIN N  injec INJECT 50 UNITS MORNING AND 25 UNITS EVENING  . Takes Biotin 1 tablet daily.   . ranitidine  300 MG tablet TAKE 1 TABLET  2 TIMES DAILY.  Marland Kitchen Zinc 50 MG CAPS Take 1 capsule   . allopurinol  300 MG tablet Take 1 tablet daily  (Patient not taking: Reported  05/18/2018)  . ciprofloxacin  500 MG tablet Take 1 tablet 2 x /day with food for Bladder Infection  . phentermine  37.5 MG tablet TAKE 1 TABLET  EVERY DAY   No facility-administered medications prior to visit.    Allergies  Allergen Reactions  . Sulfa Antibiotics Other (See Comments)    Pt  states she had "extreme pain"  . Sulfacetamide Sodium    Past Medical History:  Diagnosis Date  . Anxiety disorder   . Arthritis LOWER BACK  . Asthma   . Bipolar 1 disorder ([place])   . Carpal tunnel syndrome   . Chronic kidney disease    stage 3  . Depression   . Diabetes mellitus    type 2  . Dyslipidemia   . Gait disorder   . GERD (gastroesophageal reflux disease)   . Gout LEFT FOOT-  STABLE  . H/O hiatal hernia   . History of CVA (cerebrovascular accident) FOUND PER MRI 1994--  RESIDUAL MEMORY IMPAIRED  . Hypercholesteremia   . Hypertension   . Memory difficulty 08/14/2016  . Neuropathy of lower extremity   . OCD (obsessive compulsive disorder)   . Peripheral neuropathy   . SOB (shortness of breath) on exertion   . Stroke ([place])    2000 memory loss   . SUI (stress urinary incontinence, female)   . Vulva cancer ([place])  07/07/2012  . Vulvar lesion    Review of Systems    10 point systems review negative except as above.    Objective:   Physical Exam  BP 134/84   Pulse 88   Temp (!) 97.3 F (36.3 C)   Resp 16   Ht 5' 3.5" (1.613 m)   Wt 168 lb 12.8 oz (76.6 kg)   BMI 29.43 kg/m   HEENT - WNL. Neck - supple.  Chest - Clear equal BS. Cor - Nl HS. RRR w/o sig MGR. PP 1(+). No edema. MS- FROM w/o deformities.  Gait Nl. Neuro -  Nl w/o focal abnormalities with decreased distal sensory in LE's.     Assessment & Plan:   1. Essential hypertension  - COMPLETE METABOLIC PANEL WITH GFR  2. Type 2 diabetes mellitus with stage 3 chronic kidney disease, with long-term current use of insulin (HCC)  Strongly encouraged better compliance and CBG monitoring  - COMPLETE METABOLIC PANEL WITH GFR  3. Medication management  - COMPLETE METABOLIC PANEL WITH GFR  - CINNAMON PO; Take 1 capsule by mouth daily.  - Takes Biotin 1 tablet daily.          Marland Kitchen

## 2018-05-23 ENCOUNTER — Encounter: Payer: Self-pay | Admitting: Internal Medicine

## 2018-06-04 ENCOUNTER — Other Ambulatory Visit: Payer: Self-pay | Admitting: Internal Medicine

## 2018-07-12 ENCOUNTER — Other Ambulatory Visit: Payer: Self-pay | Admitting: Physician Assistant

## 2018-07-20 ENCOUNTER — Ambulatory Visit: Payer: Self-pay | Admitting: Adult Health

## 2018-07-20 ENCOUNTER — Encounter: Payer: Self-pay | Admitting: Adult Health Nurse Practitioner

## 2018-07-20 ENCOUNTER — Ambulatory Visit (INDEPENDENT_AMBULATORY_CARE_PROVIDER_SITE_OTHER): Payer: PPO | Admitting: Adult Health Nurse Practitioner

## 2018-07-20 VITALS — BP 144/78 | HR 78 | Temp 97.5°F | Ht 63.5 in | Wt 168.0 lb

## 2018-07-20 DIAGNOSIS — K219 Gastro-esophageal reflux disease without esophagitis: Secondary | ICD-10-CM | POA: Diagnosis not present

## 2018-07-20 DIAGNOSIS — D692 Other nonthrombocytopenic purpura: Secondary | ICD-10-CM | POA: Diagnosis not present

## 2018-07-20 DIAGNOSIS — Z79899 Other long term (current) drug therapy: Secondary | ICD-10-CM

## 2018-07-20 DIAGNOSIS — I739 Peripheral vascular disease, unspecified: Secondary | ICD-10-CM | POA: Diagnosis not present

## 2018-07-20 DIAGNOSIS — R413 Other amnesia: Secondary | ICD-10-CM

## 2018-07-20 DIAGNOSIS — E1169 Type 2 diabetes mellitus with other specified complication: Secondary | ICD-10-CM | POA: Diagnosis not present

## 2018-07-20 DIAGNOSIS — G63 Polyneuropathy in diseases classified elsewhere: Secondary | ICD-10-CM

## 2018-07-20 DIAGNOSIS — D518 Other vitamin B12 deficiency anemias: Secondary | ICD-10-CM

## 2018-07-20 DIAGNOSIS — E1122 Type 2 diabetes mellitus with diabetic chronic kidney disease: Secondary | ICD-10-CM

## 2018-07-20 DIAGNOSIS — M1000 Idiopathic gout, unspecified site: Secondary | ICD-10-CM

## 2018-07-20 DIAGNOSIS — I10 Essential (primary) hypertension: Secondary | ICD-10-CM | POA: Diagnosis not present

## 2018-07-20 DIAGNOSIS — R269 Unspecified abnormalities of gait and mobility: Secondary | ICD-10-CM | POA: Diagnosis not present

## 2018-07-20 DIAGNOSIS — E039 Hypothyroidism, unspecified: Secondary | ICD-10-CM | POA: Diagnosis not present

## 2018-07-20 DIAGNOSIS — Z1159 Encounter for screening for other viral diseases: Secondary | ICD-10-CM

## 2018-07-20 DIAGNOSIS — E559 Vitamin D deficiency, unspecified: Secondary | ICD-10-CM | POA: Diagnosis not present

## 2018-07-20 DIAGNOSIS — Z9114 Patient's other noncompliance with medication regimen: Secondary | ICD-10-CM

## 2018-07-20 DIAGNOSIS — F332 Major depressive disorder, recurrent severe without psychotic features: Secondary | ICD-10-CM

## 2018-07-20 DIAGNOSIS — Z0000 Encounter for general adult medical examination without abnormal findings: Secondary | ICD-10-CM

## 2018-07-20 DIAGNOSIS — L659 Nonscarring hair loss, unspecified: Secondary | ICD-10-CM

## 2018-07-20 DIAGNOSIS — Z1389 Encounter for screening for other disorder: Secondary | ICD-10-CM

## 2018-07-20 DIAGNOSIS — Z0001 Encounter for general adult medical examination with abnormal findings: Secondary | ICD-10-CM | POA: Diagnosis not present

## 2018-07-20 DIAGNOSIS — R6889 Other general symptoms and signs: Secondary | ICD-10-CM

## 2018-07-20 DIAGNOSIS — F509 Eating disorder, unspecified: Secondary | ICD-10-CM

## 2018-07-20 DIAGNOSIS — E1165 Type 2 diabetes mellitus with hyperglycemia: Secondary | ICD-10-CM | POA: Diagnosis not present

## 2018-07-20 DIAGNOSIS — N183 Chronic kidney disease, stage 3 unspecified: Secondary | ICD-10-CM

## 2018-07-20 DIAGNOSIS — Z91148 Patient's other noncompliance with medication regimen for other reason: Secondary | ICD-10-CM

## 2018-07-20 DIAGNOSIS — R296 Repeated falls: Secondary | ICD-10-CM

## 2018-07-20 DIAGNOSIS — E785 Hyperlipidemia, unspecified: Secondary | ICD-10-CM

## 2018-07-20 NOTE — Patient Instructions (Addendum)
We will contact you in 1-2 days with lab results    Ashley [name] , Thank you for taking time to come for your Medicare Wellness Visit. I appreciate your ongoing commitment to your health goals. Please review the following plan we discussed and let me know if I can assist you in the future.   This is a list of the screening recommended for you and due dates:  Health Maintenance  Topic Date Due  .  Hepatitis C: One time screening is recommended by Center for Disease Control  (CDC) for  adults born from [birth year] through 1964.   12-16-1945  . Flu Shot  03/18/2018  . Tetanus Vaccine  08/18/2018  . Hemoglobin A1C  10/16/2018  . Eye exam for diabetics  12/30/2018  . Complete foot exam   04/16/2019  . Mammogram  01/06/2020  . Colon Cancer Screening  10/01/2022  . DEXA scan (bone density measurement)  Completed  . Pneumonia vaccines  Completed      We Do NOT Approve of  [facility], [employer]  Our Patients  To Do Home Visits & We Do NOT Approve of [facility] > > > > > > > > > > > > > > > > > > > > > > > > > > > > > > > > > > > > > > >  Preventive Care for Adults  A healthy lifestyle and preventive care can promote health and wellness. Preventive health guidelines for women include the following key practices.  A routine yearly physical is a good way to check with your health care provider about your health and preventive screening. It is a chance to share any concerns and updates on your health and to receive a thorough exam.  Visit your dentist for a routine exam and preventive care every 6 months. Brush your teeth twice a day and floss once a day. Good oral hygiene prevents tooth decay and gum disease.  The frequency of eye exams is based on your age, health, family medical history, use of contact lenses, and other factors. Follow your health care provider's recommendations for frequency of eye exams.  Eat a healthy diet. Foods like vegetables, fruits, whole  grains, low-fat dairy products, and lean protein foods contain the nutrients you need without too many calories. Decrease your intake of foods high in solid fats, added sugars, and salt. Eat the right amount of calories for you. Get information about a proper diet from your health care provider, if necessary.  Regular physical exercise is one of the most important things you can do for your health. Most adults should get at least 150 minutes of moderate-intensity exercise (any activity that increases your heart rate and causes you to sweat) each week. In addition, most adults need muscle-strengthening exercises on 2 or more days a week.  Maintain a healthy weight. The body mass index (BMI) is a screening tool to identify possible weight problems. It provides an estimate of body fat based on height and weight. Your health care provider can find your BMI and can help you achieve or maintain a healthy weight. For adults 20 years and older:  A BMI below 18.5 is considered underweight.  A BMI of 18.5 to 24.9 is normal.  A BMI of 25 to 29.9 is considered overweight.  A BMI of 30 and above is considered obese.  Maintain normal blood lipids and cholesterol levels by exercising and minimizing your intake of saturated fat. Eat a  balanced diet with plenty of fruit and vegetables. If your lipid or cholesterol levels are high, you are over 50, or you are at high risk for heart disease, you may need your cholesterol levels checked more frequently. Ongoing high lipid and cholesterol levels should be treated with medicines if diet and exercise are not working.  If you smoke, find out from your health care provider how to quit. If you do not use tobacco, do not start.  Lung cancer screening is recommended for adults aged 71-80 years who are at high risk for developing lung cancer because of a history of smoking. A yearly low-dose CT scan of the lungs is recommended for people who have at least a 30-pack-year  history of smoking and are a current smoker or have quit within the past 15 years. A pack year of smoking is smoking an average of 1 pack of cigarettes a day for 1 year (for example: 1 pack a day for 30 years or 2 packs a day for 15 years). Yearly screening should continue until the smoker has stopped smoking for at least 15 years. Yearly screening should be stopped for people who develop a health problem that would prevent them from having lung cancer treatment.  Avoid use of street drugs. Do not share needles with anyone. Ask for help if you need support or instructions about stopping the use of drugs.  High blood pressure causes heart disease and increases the risk of stroke.  Ongoing high blood pressure should be treated with medicines if weight loss and exercise do not work.  If you are 29-65 years old, ask your health care provider if you should take aspirin to prevent strokes.  Diabetes screening involves taking a blood sample to check your fasting blood sugar level. This should be done once every 3 years, after age 48, if you are within normal weight and without risk factors for diabetes. Testing should be considered at a younger age or be carried out more frequently if you are overweight and have at least 1 risk factor for diabetes.  Breast cancer screening is essential preventive care for women. You should practice "breast self-awareness." This means understanding the normal appearance and feel of your breasts and may include breast self-examination. Any changes detected, no matter how small, should be reported to a health care provider. Women in their 7s and 30s should have a clinical breast exam (CBE) by a health care provider as part of a regular health exam every 1 to 3 years. After age 82, women should have a CBE every year. Starting at age 48, women should consider having a mammogram (breast X-ray test) every year. Women who have a family history of breast cancer should talk to their  health care provider about genetic screening. Women at a high risk of breast cancer should talk to their health care providers about having an MRI and a mammogram every year.  Breast cancer gene (BRCA)-related cancer risk assessment is recommended for women who have family members with BRCA-related cancers. BRCA-related cancers include breast, ovarian, tubal, and peritoneal cancers. Having family members with these cancers may be associated with an increased risk for harmful changes (mutations) in the breast cancer genes BRCA1 and BRCA2. Results of the assessment will determine the need for genetic counseling and BRCA1 and BRCA2 testing.  Routine pelvic exams to screen for cancer are no longer recommended for nonpregnant women who are considered low risk for cancer of the pelvic organs (ovaries, uterus, and vagina) and  who do not have symptoms. Ask your health care provider if a screening pelvic exam is right for you.  If you have had past treatment for cervical cancer or a condition that could lead to cancer, you need Pap tests and screening for cancer for at least 20 years after your treatment. If Pap tests have been discontinued, your risk factors (such as having a new sexual partner) need to be reassessed to determine if screening should be resumed. Some women have medical problems that increase the chance of getting cervical cancer. In these cases, your health care provider may recommend more frequent screening and Pap tests.    Colorectal cancer can be detected and often prevented. Most routine colorectal cancer screening begins at the age of 34 years and continues through age 34 years. However, your health care provider may recommend screening at an earlier age if you have risk factors for colon cancer. On a yearly basis, your health care provider may provide home test kits to check for hidden blood in the stool. Use of a small camera at the end of a tube, to directly examine the colon  (sigmoidoscopy or colonoscopy), can detect the earliest forms of colorectal cancer. Talk to your health care provider about this at age 28, when routine screening begins.  Direct exam of the colon should be repeated every 5-10 years through age 16 years, unless early forms of pre-cancerous polyps or small growths are found.  Osteoporosis is a disease in which the bones lose minerals and strength with aging. This can result in serious bone fractures or breaks. The risk of osteoporosis can be identified using a bone density scan. Women ages 51 years and over and women at risk for fractures or osteoporosis should discuss screening with their health care providers. Ask your health care provider whether you should take a calcium supplement or vitamin D to reduce the rate of osteoporosis.  Menopause can be associated with physical symptoms and risks. Hormone replacement therapy is available to decrease symptoms and risks. You should talk to your health care provider about whether hormone replacement therapy is right for you.  Use sunscreen. Apply sunscreen liberally and repeatedly throughout the day. You should seek shade when your shadow is shorter than you. Protect yourself by wearing long sleeves, pants, a wide-brimmed hat, and sunglasses year round, whenever you are outdoors.  Once a month, do a whole body skin exam, using a mirror to look at the skin on your back. Tell your health care provider of new moles, moles that have irregular borders, moles that are larger than a pencil eraser, or moles that have changed in shape or color.  Stay current with required vaccines (immunizations).  Influenza vaccine. All adults should be immunized every year.  Tetanus, diphtheria, and acellular pertussis (Td, Tdap) vaccine. Pregnant women should receive 1 dose of Tdap vaccine during each pregnancy. The dose should be obtained regardless of the length of time since the last dose. Immunization is preferred during the  27th-36th week of gestation. An adult who has not previously received Tdap or who does not know her vaccine status should receive 1 dose of Tdap. This initial dose should be followed by tetanus and diphtheria toxoids (Td) booster doses every 10 years. Adults with an unknown or incomplete history of completing a 3-dose immunization series with Td-containing vaccines should begin or complete a primary immunization series including a Tdap dose. Adults should receive a Td booster every 10 years.    Zoster vaccine. One  dose is recommended for adults aged 57 years or older unless certain conditions are present.    Pneumococcal 13-valent conjugate (PCV13) vaccine. When indicated, a person who is uncertain of her immunization history and has no record of immunization should receive the PCV13 vaccine. An adult aged 102 years or older who has certain medical conditions and has not been previously immunized should receive 1 dose of PCV13 vaccine. This PCV13 should be followed with a dose of pneumococcal polysaccharide (PPSV23) vaccine. The PPSV23 vaccine dose should be obtained at least 1 or more year(s) after the dose of PCV13 vaccine. An adult aged 69 years or older who has certain medical conditions and previously received 1 or more doses of PPSV23 vaccine should receive 1 dose of PCV13. The PCV13 vaccine dose should be obtained 1 or more years after the last PPSV23 vaccine dose.    Pneumococcal polysaccharide (PPSV23) vaccine. When PCV13 is also indicated, PCV13 should be obtained first. All adults aged 85 years and older should be immunized. An adult younger than age 64 years who has certain medical conditions should be immunized. Any person who resides in a nursing home or long-term care facility should be immunized. An adult smoker should be immunized. People with an immunocompromised condition and certain other conditions should receive both PCV13 and PPSV23 vaccines. People with human immunodeficiency virus  (HIV) infection should be immunized as soon as possible after diagnosis. Immunization during chemotherapy or radiation therapy should be avoided. Routine use of PPSV23 vaccine is not recommended for American Indians, [place]atives, or people younger than 65 years unless there are medical conditions that require PPSV23 vaccine. When indicated, people who have unknown immunization and have no record of immunization should receive PPSV23 vaccine. One-time revaccination 5 years after the first dose of PPSV23 is recommended for people aged 19-64 years who have chronic kidney failure, nephrotic syndrome, asplenia, or immunocompromised conditions. People who received 1-2 doses of PPSV23 before age 13 years should receive another dose of PPSV23 vaccine at age 1 years or later if at least 5 years have passed since the previous dose. Doses of PPSV23 are not needed for people immunized with PPSV23 at or after age 47 years.   Preventive Services / Frequency  Ages 15 years and over  Blood pressure check.  Lipid and cholesterol check.  Lung cancer screening. / Every year if you are aged 30-80 years and have a 30-pack-year history of smoking and currently smoke or have quit within the past 15 years. Yearly screening is stopped once you have quit smoking for at least 15 years or develop a health problem that would prevent you from having lung cancer treatment.  Clinical breast exam.** / Every year after age 57 years.   BRCA-related cancer risk assessment.** / For women who have family members with a BRCA-related cancer (breast, ovarian, tubal, or peritoneal cancers).  Mammogram.** / Every year beginning at age 69 years and continuing for as long as you are in good health. Consult with your health care provider.  Pap test.** / Every 3 years starting at age 3 years through age 45 or 84 years with 3 consecutive normal Pap tests. Testing can be stopped between 65 and 70 years with 3 consecutive normal Pap tests  and no abnormal Pap or HPV tests in the past 10 years.  Fecal occult blood test (FOBT) of stool. / Every year beginning at age 67 years and continuing until age 77 years. You may not need to do this test  if you get a colonoscopy every 10 years.  Flexible sigmoidoscopy or colonoscopy.** / Every 5 years for a flexible sigmoidoscopy or every 10 years for a colonoscopy beginning at age 52 years and continuing until age 31 years.  Hepatitis C blood test.** / For all people born from [birth year] through 1964 and any individual with known risks for hepatitis C.  Osteoporosis screening.** / A one-time screening for women ages 75 years and over and women at risk for fractures or osteoporosis.  Skin self-exam. / Monthly.  Influenza vaccine. / Every year.  Tetanus, diphtheria, and acellular pertussis (Tdap/Td) vaccine.** / 1 dose of Td every 10 years.  Zoster vaccine.** / 1 dose for adults aged 61 years or older.  Pneumococcal 13-valent conjugate (PCV13) vaccine.** / Consult your health care provider.  Pneumococcal polysaccharide (PPSV23) vaccine.** / 1 dose for all adults aged 46 years and older. Screening for abdominal aortic aneurysm (AAA)  by ultrasound is recommended for people who have history of high blood pressure or who are current or former smokers. ++++++++++++++++++++ Recommend Adult Low Dose Aspirin or  coated  Aspirin 81 mg daily  To reduce risk of Colon Cancer 20 %,  Skin Cancer 26 % ,  Melanoma 46%  and  Pancreatic cancer 60% ++++++++++++++++++++ Vitamin D goal  is between 70-100.  Please make sure that you are taking your Vitamin D as directed.  It is very important as a natural anti-inflammatory  helping hair, skin, and nails, as well as reducing stroke and heart attack risk.  It helps your bones and helps with mood. It also decreases numerous cancer risks so please take it as directed.  Low Vit D is associated with a 200-300% higher risk for CANCER  and 200-300% higher risk  for HEART   ATTACK  &  STROKE.   .....................................Marland Kitchen It is also associated with higher death rate at younger ages,  autoimmune diseases like Rheumatoid arthritis, Lupus, Multiple Sclerosis.    Also many other serious conditions, like depression, Alzheimer's Dementia, infertility, muscle aches, fatigue, fibromyalgia - just to name a few. ++++++++++++++++++ Recommend the book "The END of DIETING" by Dr Excell Seltzer  & the book "The END of DIABETES " by Dr Excell Seltzer At [email or web address] - get book & Audio CD's    Being diabetic has a  300% increased risk for heart attack, stroke, cancer, and alzheimer- type vascular dementia. It is very important that you work harder with diet by avoiding all foods that are white. Avoid white rice (brown & wild rice is OK), white potatoes (sweetpotatoes in moderation is OK), White bread or wheat bread or anything made out of white flour like bagels, donuts, rolls, buns, biscuits, cakes, pastries, cookies, pizza crust, and pasta (made from white flour & egg whites) - vegetarian pasta or spinach or wheat pasta is OK. Multigrain breads like Arnold's or [employer], or multigrain sandwich thins or flatbreads.  Diet, exercise and weight loss can reverse and cure diabetes in the early stages.  Diet, exercise and weight loss is very important in the control and prevention of complications of diabetes which affects every system in your body, ie. Brain - dementia/stroke, eyes - glaucoma/blindness, heart - heart attack/heart failure, kidneys - dialysis, stomach - gastric paralysis, intestines - malabsorption, nerves - severe painful neuritis, circulation - gangrene & loss of a leg(s), and finally cancer and Alzheimers.    I recommend avoid fried & greasy foods,  sweets/candy, white rice (brown or wild rice or Quinoa is  OK), white potatoes (sweet potatoes are OK) - anything made from white flour - bagels, doughnuts, rolls, buns, biscuits,white and wheat breads,  pizza crust and traditional pasta made of white flour & egg white(vegetarian pasta or spinach or wheat pasta is OK).  Multi-grain bread is OK - like multi-grain flat bread or sandwich thins. Avoid alcohol in excess. Exercise is also important.    Eat all the vegetables you want - avoid meat, especially red meat and dairy - especially cheese.  Cheese is the most concentrated form of trans-fats which is the worst thing to clog up our arteries. Veggie cheese is OK which can be found in the fresh produce section at [employer] or [employer] or [employer]  +++++++++++++++++++ DASH Eating Plan  DASH stands for "Dietary Approaches to Stop Hypertension."   The DASH eating plan is a healthy eating plan that has been shown to reduce high blood pressure (hypertension). Additional health benefits may include reducing the risk of type 2 diabetes mellitus, heart disease, and stroke. The DASH eating plan may also help with weight loss. WHAT DO I NEED TO KNOW ABOUT THE DASH EATING PLAN? For the DASH eating plan, you will follow these general guidelines:  Choose foods with a percent daily value for sodium of less than 5% (as listed on the food label).  Use salt-free seasonings or herbs instead of table salt or sea salt.  Check with your health care provider or pharmacist before using salt substitutes.  Eat lower-sodium products, often labeled as "lower sodium" or "no salt added."  Eat fresh foods.  Eat more vegetables, fruits, and low-fat dairy products.  Choose whole grains. Look for the word "whole" as the first word in the ingredient list.  Choose fish   Limit sweets, desserts, sugars, and sugary drinks.  Choose heart-healthy fats.  Eat veggie cheese   Eat more home-cooked food and less restaurant, buffet, and fast food.  Limit fried foods.  Cook foods using methods other than frying.  Limit canned vegetables. If you do use them, rinse them well to decrease the sodium.  When eating at  a restaurant, ask that your food be prepared with less salt, or no salt if possible.                      WHAT FOODS CAN I EAT? Read Dr Fara Olden Fuhrman's books on The End of Dieting & The End of Diabetes  Grains Whole grain or whole wheat bread. Brown rice. Whole grain or whole wheat pasta. Quinoa, bulgur, and whole grain cereals. Low-sodium cereals. Corn or whole wheat flour tortillas. Whole grain cornbread. Whole grain crackers. Low-sodium crackers.  Vegetables Fresh or frozen vegetables (raw, steamed, roasted, or grilled). Low-sodium or reduced-sodium tomato and vegetable juices. Low-sodium or reduced-sodium tomato sauce and paste. Low-sodium or reduced-sodium canned vegetables.   Fruits All fresh, canned (in natural juice), or frozen fruits.  Protein Products  All fish and seafood.  Dried beans, peas, or lentils. Unsalted nuts and seeds. Unsalted canned beans.  Dairy Low-fat dairy products, such as skim or 1% milk, 2% or reduced-fat cheeses, low-fat ricotta or cottage cheese, or plain low-fat yogurt. Low-sodium or reduced-sodium cheeses.  Fats and Oils Tub margarines without trans fats. Light or reduced-fat mayonnaise and salad dressings (reduced sodium). Avocado. Safflower, olive, or canola oils. Natural peanut or almond butter.  Other Unsalted popcorn and pretzels. The items listed above may not be a complete list of recommended foods or beverages. Contact your  dietitian for more options.  +++++++++++++++  WHAT FOODS ARE NOT RECOMMENDED? Grains/ White flour or wheat flour White bread. White pasta. White rice. Refined cornbread. Bagels and croissants. Crackers that contain trans fat.  Vegetables  Creamed or fried vegetables. Vegetables in a . Regular canned vegetables. Regular canned tomato sauce and paste. Regular tomato and vegetable juices.  Fruits Dried fruits. Canned fruit in light or heavy syrup. Fruit juice.  Meat and Other Protein Products Meat in general - RED meat  & White meat.  Fatty cuts of meat. Ribs, chicken wings, all processed meats as bacon, sausage, bologna, salami, fatback, hot dogs, bratwurst and packaged luncheon meats.  Dairy Whole or 2% milk, cream, half-and-half, and cream cheese. Whole-fat or sweetened yogurt. Full-fat cheeses or blue cheese. Non-dairy creamers and whipped toppings. Processed cheese, cheese spreads, or cheese curds.  Condiments Onion and garlic salt, seasoned salt, table salt, and sea salt. Canned and packaged gravies. Worcestershire sauce. Tartar sauce. Barbecue sauce. Teriyaki sauce. Soy sauce, including reduced sodium. Steak sauce. Fish sauce. Oyster sauce. Cocktail sauce. Horseradish. Ketchup and mustard. Meat flavorings and tenderizers. Bouillon cubes. Hot sauce. Tabasco sauce. Marinades. Taco seasonings. Relishes.  Fats and Oils Butter, stick margarine, lard, shortening and bacon fat. Coconut, palm kernel, or palm oils. Regular salad dressings.  Pickles and olives. Salted popcorn and pretzels.  The items listed above may not be a complete list of foods and beverages to avoid.

## 2018-07-20 NOTE — Progress Notes (Signed)
MEDICARE ANNUAL WELLNESS VISIT AND FOLLOW UP  Assessment:   Ashley [name] was seen today for follow-up and medicare wellness.  Diagnoses and all orders for this visit:  Medicare annual wellness visit, subsequent Continue yearly  Essential hypertension Continue on current regiment Check blood pressure at home -     CBC with Differential/Platelet -     COMPLETE METABOLIC PANEL WITH GFR -     Magnesium -     Microalbumin / creatinine urine ratio  Senile purpura (HCC) Monitor skin for changes Discussed safety with ASA  Peripheral arterial disease ([place]) Doing well at this time  Gastroesophageal reflux disease, esophagitis presence not specified Doing well with zantac, continue with benefit -     Magnesium  Hypothyroidism, unspecified type Taking levothyroxine 133mcg daily -     Magnesium -     TSH  Diabetic CKD 3  (GFR 44 ml/min) (HCC) Increase fluids, avoid NSAIDS, monitor sugars, will monitor  Hyperlipidemia associated with type 2 diabetes mellitus ([place]) Continue current regiment Discussed dietary modifications, patient not open to this  Uncontrolled type 2 diabetes mellitus with hyperglycemia ([place]) Discussed dietary modifications to improve health and monitor diabetes STOP drinking 8 extra large cups of soda from [employer] daily! -     Hemoglobin A1c -     Insulin, random  Diabetic Peripheral Neuropathy Continue medications: Continue diet and exercise.  Perform daily foot/skin check, notify office of any concerning changes.  Check A1C  Severe episode of recurrent major depressive disorder, without psychotic features ([place]) Doing well at this time Taking prozac saily Other vitamin B12 deficiency anemia  Abnormality of gait Using four prong cane to assist with ambulation  Vitamin D deficiency Continue supplementation -     VITAMIN D 25 Hydroxy  Medication management -     CBC with Differential/Platelet -     COMPLETE METABOLIC PANEL WITH GFR -      Magnesium -     Lipid panel -     TSH -     Hemoglobin A1c -     Insulin, random -     VITAMIN D 25 Hydroxy (Vit-D Deficiency, Fractures) -     Vitamin B12 -     Microalbumin / creatinine urine ratio -     Urinalysis w microscopic + reflex cultur  Idiopathic gout, unspecified chronicity, unspecified site No medications at this time -     Uric acid  Non compliance w medication regimen Discussed this at length with patient Denies any difficultly with medications  Gluttomy (Compulsive Eating Behavior Disorder) Discussed dietary changes with patient -     TSH -     Hemoglobin A1c  Frequent falls Discussed safety for patient in and out of the home  Memory difficulty continue to monitor  Screening for blood or protein in urine -     Microalbumin / creatinine urine ratio -     Urinalysis w microscopic + reflex cultur  Thinning hair Will check TSH  Need for hepatitis C screening test -     Hepatitis C antibody  Other orders -     REFLEXIVE URINE CULTURE -     Urine Culture    Call or return with new or worsening symptoms as discussed in appointment.  May contact via office phone [phone] or  [place].   Over 40 minutes of exam, counseling, chart review and critical decision making was performed Future Appointments  Date Time Provider [place]  10/21/2018  3:30 PM Melford Aase,  Gwyndolyn Saxon, MD [facility] None  05/24/2019  3:00 PM Unk Pinto, MD [facility] None  07/26/2019  4:15 PM Garnet Sierras, NP [facility] None     Plan:   During the course of the visit the patient was educated and counseled about appropriate screening and preventive services including:    Pneumococcal vaccine   Prevnar 13  Influenza vaccine  Td vaccine  Screening electrocardiogram  Bone densitometry screening  Colorectal cancer screening  Diabetes screening  Glaucoma screening  Nutrition counseling   Advanced directives: requested   Subjective:  Ashley [name]  is a 72 y.o. female who presents for Medicare Annual Wellness Visit and 3 month follow up.    She is alert and oriented and applying make up with hand mirror when entering the room.  She does not stop to Regulatory affairs officer.  She is pleasant WF that reports she does not have any concerns today.  She is concentric with conversation and unable to give a direct answer to any type of questioning.  She has hisoty of HTN, HLD, DMII with nueropathy, hypothyroidism, depression, GERD, PAD, CKD, abnormal gait, vit D & B12 defciencies, and frequent falls.  She has had elevated blood pressure for 35 years. Her blood pressure has been controlled at home, today their BP is BP: (!) 144/78.  Reports she did not take her blood pressure medications today.  She does not workout. She denies chest pain, shortness of breath, dizziness.  She is on cholesterol medication and denies myalgias. Her cholesterol is not at goal. The cholesterol last visit was:   Lab Results  Component Value Date   CHOL 157 07/20/2018   HDL 41 (L) 07/20/2018   LDLCALC 85 07/20/2018   TRIG 221 (H) 07/20/2018   CHOLHDL 3.8 07/20/2018   She has had diabetes for 21 years. She has not been working on diet and exercise for diabetes and denies foot ulcerations, hyperglycemia, hypoglycemia , nausea, paresthesia of the feet, polydipsia and polyuria. Last A1C in the office was:  Lab Results  Component Value Date   HGBA1C 8.4 (H) 07/20/2018   Last GFR:   Lab Results  Component Value Date   GFRNONAA 42 (L) 07/20/2018   Lab Results  Component Value Date   GFRAA 48 (L) 07/20/2018   Patient is on Vitamin D supplement.   Lab Results  Component Value Date   VD25OH 51 07/20/2018      Medication Review: Current Outpatient Medications on File Prior to Visit  Medication Sig Dispense Refill  . aspirin EC 81 MG tablet Take 81 mg by mouth daily.    Marland Kitchen atorvastatin (LIPITOR) 40 MG tablet Take 1 tablet (40 mg total) daily by mouth. 90 tablet 1  .  CINNAMON PO Take 1 capsule by mouth daily.    . clonazePAM (KLONOPIN) 1 MG tablet Take 1 mg 2 (two) times daily as needed by mouth for anxiety.     . fenofibrate (TRICOR) 145 MG tablet Take 1 tablet (145 mg total) by mouth daily. 90 tablet 1  . FLUoxetine (PROZAC) 40 MG capsule TAKE ONE CAPSULE BY MOUTH EVERY DAY 90 capsule 3  . gabapentin (NEURONTIN) 100 MG capsule Take n1 capsule 3 to 4 x /day as needed for Neuropathy Pain 360 capsule 1  . gabapentin (NEURONTIN) 300 MG capsule TAKE ONE CAPSULE BY MOUTH IN THE EVENING 90 capsule 1  . glipiZIDE (GLUCOTROL) 5 MG tablet Take 1 tablet 3 x /day with meals 270 tablet 1  . glucose blood (ONE  TOUCH ULTRA TEST) test strip TEST BLOOD SUGAR 3 TIMES A DAY (DX E11.9) 300 each 1  . hydrochlorothiazide (HYDRODIURIL) 25 MG tablet TAKE 1 TABLET BY MOUTH EVERY OTHER DAY 90 tablet 1  . ipratropium (ATROVENT) 0.06 % nasal spray Use 1 to 2 sprays each nostril 2 to 3 x / day as needed 15 mL 11  . levothyroxine (SYNTHROID, LEVOTHROID) 100 MCG tablet TAKE 1 TABLET BY MOUTH EVERY DAY 90 tablet 1  . losartan (COZAAR) 100 MG tablet TAKE 1 TABLET BY MOUTH EVERY DAY FOR BLOOD PRESSURE AND KIDNEY PROTECTION 90 tablet 1  . metFORMIN (GLUCOPHAGE-XR) 500 MG 24 hr tablet TAKE 2 TABLETS BY MOUTH TWICE A DAY 360 tablet 0  . NOVOLIN N 100 UNIT/ML injection INJECT 50 UNITS EVERY MORNING AND 25 UNITS EVERY EVENING 70 mL 1  . OVER THE COUNTER MEDICATION Takes Biotin 1 tablet daily.    . ranitidine (ZANTAC) 300 MG tablet TAKE 1 TABLET (300 MG TOTAL) BY MOUTH 2 (TWO) TIMES DAILY. 180 tablet 3  . Zinc 50 MG CAPS Take 1 capsule by mouth.     No current facility-administered medications on file prior to visit.     Allergies  Allergen Reactions  . Sulfa Antibiotics Other (See Comments)    Pt states she had "extreme pain"  . Sulfacetamide Sodium     Current Problems (verified) Patient Active Problem List   Diagnosis Date Noted  . Obesity (BMI 30.0-34.9) 01/06/2018  . Diabetes  mellitus type 2, uncontrolled ([place]) 12/17/2017  . Leg swelling 10/20/2017  . Peripheral arterial disease ([place]) 10/20/2017  . Diarrhea   . Polyp of ascending colon   . Mild nonproliferative diabetic retinopathy of both eyes without macular edema associated with type 2 diabetes mellitus ([place]) 06/09/2017  . Senile purpura ([place]) 06/09/2017  . Hyperlipidemia associated with type 2 diabetes mellitus ([place]) 06/08/2017  . Memory difficulty 08/14/2016  . Frequent falls 04/08/2016  . Hypothyroidism 03/05/2016  . GERD (gastroesophageal reflux disease) 03/05/2016  . Diabetic CKD 3  (GFR 44 ml/min) (HCC) 03/05/2016  . 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 Gout 08/29/2015  . Non compliance w medication regimen 02/20/2015  . Bipolar depression ([place]) 11/06/2014  . Vitamin D deficiency 12/01/2013  . Medication management 12/01/2013  . Abnormality of gait 03/10/2013  . Other vitamin B12 deficiency anemia 10/25/2012  . Carpal tunnel syndrome 10/25/2012  . Diabetic Peripheral Neuropathy 10/25/2012  . Major depressive disorder, recurrent episode ([place]) 10/12/2009  . Essential hypertension 10/12/2009  . COPD 10/12/2009    Screening Tests Immunization History  Administered Date(s) Administered  . Pneumococcal Conjugate-13 07/26/2014  . Pneumococcal-Unspecified 11/16/2012  . Td 08/18/2008  . Zoster 10/03/2008    Preventative care: Last colonoscopy: 2019 Last mammogram: 2019 Last pap smear/pelvic exam: 2015   DEXA:2009  Prior vaccinations: TD or Tdap: 2010  Influenza: Declines Pneumococcal: 2014 Prevnar13: 2015 Shingles/Zostavax: Declined  Names of Other Physician/Practitioners you currently use: 1. [facility] here for primary care 2. Eye exam 2019 3. Dentist 2019 Patient Care Team: Unk Pinto, MD as PCP - General (Internal Medicine) Chucky May, MD as Consulting Physician (Psychiatry) [name], MD as  Attending Physician (Obstetrics and Gynecology) [name], MD as Attending Physician (Gastroenterology)  SURGICAL HISTORY She  has a past surgical history that includes Bladder suspension (1996); NASAL AND FACIAL SURGERY (1985); Vaginal hysterectomy (1985); Vulvectomy partial ([place]); Vulvar lesion removal (06/22/2012); and Colonoscopy with propofol (N/A, 10/01/2017). FAMILY HISTORY Her  family history includes Heart disease in her father; Heart failure in her father; Hyperlipidemia in her father and mother; Hypertension in her brother and father; Stroke in her father and mother. SOCIAL HISTORY She  reports that she has been smoking cigarettes. She has a 54.00 pack-year smoking history. She has never used smokeless tobacco. She reports that she does not drink alcohol or use drugs.   MEDICARE WELLNESS OBJECTIVES: Physical activity: Current Exercise Habits: Home exercise routine;The patient does not participate in regular exercise at present, Exercise limited by: psychological condition(s);orthopedic condition(s) Cardiac risk factors: Cardiac Risk Factors include: advanced age (>50men, >25 women);diabetes mellitus;dyslipidemia;hypertension;sedentary lifestyle;obesity (BMI >30kg/m2) Depression/mood screen:   Depression screen [facility] 2/9 07/20/2018  Decreased Interest 3  Down, Depressed, Hopeless 2  PHQ - 2 Score 5  Altered sleeping 2  Tired, decreased energy 2  Change in appetite 1  Feeling bad or failure about yourself  1  Trouble concentrating 3  Moving slowly or fidgety/restless 1  Suicidal thoughts 0  PHQ-9 Score 15  Difficult doing work/chores Not difficult at all  Some recent data might be hidden    ADLs:  In your present state of health, do you have any difficulty performing the following activities: 07/22/2018 07/22/2018  Hearing? N -  Vision? N -  Difficulty concentrating or making decisions? Y -  Walking or climbing stairs? N N  Dressing or bathing? N -  Doing errands,  shopping? Y -  Conservation officer, nature and eating ? Y -  Using the Toilet? N -  In the past six months, have you accidently leaked urine? Y -  Do you have problems with loss of bowel control? Y -  Managing your Medications? N -  Managing your Finances? N -  Housekeeping or managing your Housekeeping? Y -  Some recent data might be hidden     Cognitive Testing  Alert? Yes  Normal Appearance?Yes  Oriented to person? Yes  Place? Yes   Time? Yes  Recall of three objects?  Yes  Can perform simple calculations? Yes  Displays appropriate judgment?Yes  Can read the correct time from a watch face?Yes  EOL planning: Does Patient Have a Medical Advance Directive?: No Would patient like information on creating a medical advance directive?: No - Patient declined  Review of Systems  Constitutional: Negative for chills, diaphoresis, fever, malaise/fatigue and weight loss.  HENT: Negative for congestion, ear discharge, ear pain, hearing loss, nosebleeds, sinus pain, sore throat and tinnitus.   Eyes: Negative for blurred vision, double vision, photophobia, pain, discharge and redness.  Respiratory: Negative for cough, hemoptysis, sputum production, shortness of breath, wheezing and stridor.   Cardiovascular: Positive for leg swelling. Negative for chest pain, palpitations, orthopnea, claudication and PND.  Gastrointestinal: Negative for abdominal pain, blood in stool, constipation, diarrhea, heartburn, melena, nausea and vomiting.  Genitourinary: Positive for urgency. Negative for dysuria, flank pain, frequency and hematuria.       Notes incontinence of bladder with some bowel leakage.  No change from last visit.  Skin: Negative for itching and rash.     Objective:     Today's Vitals   07/20/18 1618  BP: (!) 144/78  Pulse: 78  Temp: (!) 97.5 F (36.4 C)  SpO2: 98%  Weight: 168 lb (76.2 kg)  Height: 5' 3.5" (1.613 m)  PainSc: 3   PainLoc: Rib Cage   Body mass index is 29.29 kg/m.  General  appearance: alert, no distress, WD/WN, female HEENT: normocephalic, sclerae anicteric, TMs pearly, nares patent,  no discharge or erythema, pharynx normal Oral cavity: MMM, no lesions Neck: supple, no lymphadenopathy, no thyromegaly, no masses Heart: RRR, normal S1, S2, no murmurs Lungs: CTA bilaterally, no wheezes, rhonchi, or rales Abdomen: +bs, soft, non tender, non distended, no masses, no hepatomegaly, no splenomegaly Musculoskeletal: nontender, no swelling, no obvious deformity Extremities: no edema, no cyanosis, no clubbing Pulses: 2+ symmetric, upper and lower extremities, normal cap refill Neurological: alert, oriented x 3, CN2-12 intact, strength normal upper extremities and lower extremities, sensation normal throughout, DTRs 2+ throughout, no cerebellar signs, gait normal Psychiatric: normal affect, behavior normal, pleasant   Medicare Attestation I have personally reviewed: The patient's medical and social history Their use of alcohol, tobacco or illicit drugs Their current medications and supplements The patient's functional ability including ADLs,fall risks, home safety risks, cognitive, and hearing and visual impairment Diet and physical activities Evidence for depression or mood disorders  The patient's weight, height, BMI, and visual acuity have been recorded in the chart.  I have made referrals, counseling, and provided education to the patient based on review of the above and I have provided the patient with a written personalized care plan for preventive services.     Garnet Sierras, NP   07/22/2018

## 2018-07-22 ENCOUNTER — Encounter: Payer: Self-pay | Admitting: Adult Health Nurse Practitioner

## 2018-07-22 DIAGNOSIS — R809 Proteinuria, unspecified: Secondary | ICD-10-CM | POA: Diagnosis not present

## 2018-07-22 DIAGNOSIS — I129 Hypertensive chronic kidney disease with stage 1 through stage 4 chronic kidney disease, or unspecified chronic kidney disease: Secondary | ICD-10-CM | POA: Diagnosis not present

## 2018-07-22 DIAGNOSIS — E1122 Type 2 diabetes mellitus with diabetic chronic kidney disease: Secondary | ICD-10-CM | POA: Diagnosis not present

## 2018-07-22 DIAGNOSIS — N183 Chronic kidney disease, stage 3 (moderate): Secondary | ICD-10-CM | POA: Diagnosis not present

## 2018-07-22 DIAGNOSIS — E785 Hyperlipidemia, unspecified: Secondary | ICD-10-CM | POA: Diagnosis not present

## 2018-07-22 DIAGNOSIS — M109 Gout, unspecified: Secondary | ICD-10-CM | POA: Diagnosis not present

## 2018-07-22 LAB — HEPATITIS C ANTIBODY
Hepatitis C Ab: NONREACTIVE
SIGNAL TO CUT-OFF: 0.01 (ref <1.00)

## 2018-07-22 LAB — MICROALBUMIN / CREATININE URINE RATIO
Creatinine, Urine: 54 mg/dL (ref 20–275)
Microalb Creat Ratio: 4541 mcg/mg creat — ABNORMAL HIGH (ref <30)
Microalb, Ur: 245.2 mg/dL

## 2018-07-22 LAB — CBC WITH DIFFERENTIAL/PLATELET
Basophils Absolute: 31 cells/uL (ref 0–200)
Basophils Relative: 0.4 %
Eosinophils Absolute: 140 cells/uL (ref 15–500)
Eosinophils Relative: 1.8 %
HCT: 35.7 % (ref 35.0–45.0)
Hemoglobin: 11.9 g/dL (ref 11.7–15.5)
Lymphs Abs: 1958 cells/uL (ref 850–3900)
MCH: 29.5 pg (ref 27.0–33.0)
MCHC: 33.3 g/dL (ref 32.0–36.0)
MCV: 88.6 fL (ref 80.0–100.0)
MPV: 10.9 fL (ref 7.5–12.5)
Monocytes Relative: 6.7 %
Neutro Abs: 5148 cells/uL (ref 1500–7800)
Neutrophils Relative %: 66 %
Platelets: 319 10*3/uL (ref 140–400)
RBC: 4.03 10*6/uL (ref 3.80–5.10)
RDW: 12.9 % (ref 11.0–15.0)
Total Lymphocyte: 25.1 %
WBC mixed population: 523 cells/uL (ref 200–950)
WBC: 7.8 10*3/uL (ref 3.8–10.8)

## 2018-07-22 LAB — URINALYSIS W MICROSCOPIC + REFLEX CULTURE
Bacteria, UA: NONE SEEN /HPF
Bilirubin Urine: NEGATIVE
Glucose, UA: NEGATIVE
Hyaline Cast: NONE SEEN /LPF
Ketones, ur: NEGATIVE
Nitrites, Initial: NEGATIVE
Specific Gravity, Urine: 1.012 (ref 1.001–1.03)
pH: 5.5 (ref 5.0–8.0)

## 2018-07-22 LAB — MAGNESIUM: Magnesium: 1.3 mg/dL — ABNORMAL LOW (ref 1.5–2.5)

## 2018-07-22 LAB — LIPID PANEL
Cholesterol: 157 mg/dL (ref <200)
HDL: 41 mg/dL — ABNORMAL LOW (ref >50)
LDL Cholesterol (Calc): 85 mg/dL (calc)
Non-HDL Cholesterol (Calc): 116 mg/dL (calc) (ref <130)
Total CHOL/HDL Ratio: 3.8 (calc) (ref <5.0)
Triglycerides: 221 mg/dL — ABNORMAL HIGH (ref <150)

## 2018-07-22 LAB — HEMOGLOBIN A1C
Hgb A1c MFr Bld: 8.4 % of total Hgb — ABNORMAL HIGH (ref <5.7)
Mean Plasma Glucose: 194 (calc)
eAG (mmol/L): 10.8 (calc)

## 2018-07-22 LAB — COMPLETE METABOLIC PANEL WITH GFR
AG Ratio: 1.6 (calc) (ref 1.0–2.5)
ALT: 13 U/L (ref 6–29)
AST: 16 U/L (ref 10–35)
Albumin: 3.8 g/dL (ref 3.6–5.1)
Alkaline phosphatase (APISO): 41 U/L (ref 33–130)
BUN/Creatinine Ratio: 17 (calc) (ref 6–22)
BUN: 22 mg/dL (ref 7–25)
CO2: 27 mmol/L (ref 20–32)
Calcium: 9.6 mg/dL (ref 8.6–10.4)
Chloride: 99 mmol/L (ref 98–110)
Creat: 1.29 mg/dL — ABNORMAL HIGH (ref 0.60–0.93)
GFR, Est African American: 48 mL/min/{1.73_m2} — ABNORMAL LOW (ref >=60)
GFR, Est Non African American: 42 mL/min/{1.73_m2} — ABNORMAL LOW (ref >=60)
Globulin: 2.4 g/dL (calc) (ref 1.9–3.7)
Glucose, Bld: 180 mg/dL — ABNORMAL HIGH (ref 65–99)
Potassium: 3.6 mmol/L (ref 3.5–5.3)
Sodium: 135 mmol/L (ref 135–146)
Total Bilirubin: 0.3 mg/dL (ref 0.2–1.2)
Total Protein: 6.2 g/dL (ref 6.1–8.1)

## 2018-07-22 LAB — URIC ACID: Uric Acid, Serum: 4.2 mg/dL (ref 2.5–7.0)

## 2018-07-22 LAB — URINE CULTURE
MICRO NUMBER:: 91451646
SPECIMEN QUALITY:: ADEQUATE

## 2018-07-22 LAB — VITAMIN B12: Vitamin B-12: 1471 pg/mL — ABNORMAL HIGH (ref 200–1100)

## 2018-07-22 LAB — CULTURE INDICATED

## 2018-07-22 LAB — INSULIN, RANDOM: Insulin: 22.7 u[IU]/mL — ABNORMAL HIGH (ref 2.0–19.6)

## 2018-07-22 LAB — VITAMIN D 25 HYDROXY (VIT D DEFICIENCY, FRACTURES): Vit D, 25-Hydroxy: 51 ng/mL (ref 30–100)

## 2018-07-22 LAB — TSH: TSH: 2.41 mIU/L (ref 0.40–4.50)

## 2018-09-03 ENCOUNTER — Other Ambulatory Visit: Payer: Self-pay | Admitting: Internal Medicine

## 2018-09-05 ENCOUNTER — Other Ambulatory Visit: Payer: Self-pay | Admitting: Internal Medicine

## 2018-09-05 DIAGNOSIS — E1140 Type 2 diabetes mellitus with diabetic neuropathy, unspecified: Secondary | ICD-10-CM

## 2018-09-29 ENCOUNTER — Other Ambulatory Visit: Payer: Self-pay | Admitting: Internal Medicine

## 2018-09-29 DIAGNOSIS — I10 Essential (primary) hypertension: Secondary | ICD-10-CM

## 2018-10-01 ENCOUNTER — Other Ambulatory Visit: Payer: Self-pay | Admitting: Pharmacist

## 2018-10-01 NOTE — Patient Outreach (Signed)
[facility]) Care Management  [place] - Medication Adherence   10/01/2018  Riniyah Speich [place] July 11, 1946 [id number]  [facility] Medication: losartan 100 mg Date & Supply of last refill: 07/04/18, 90 day supply Current insurance:[employer]   Outreach:  Incoming call from [employer] in response to the EMMI Medication Adherence Campaign. Speak with patient. HIPAA identifiers verified.  Subjective:  Ms. Dorado reports that she takes her losartan 100 mg once daily as directed. Denies any current missed doses or barriers to adherence. However, reports that earlier in the year she had accidentally stopped taking it because she misplaced the bottle. Reports that she resumed taking the losartan when she realized that she was missing it. Counsel patient on the importance of medication adherence. Patient reports that she currently uses a weekly pillbox to organize her medications. Counsel patient to also use the medication list from her most recent after visit summary to assist her when filling her pill box each week, to ensure that no medications are left off. Patient states that she will start doing this.  Patient states that she has a request for her PCP about a change that she would like made to her gabapentin prescription. Offer to assist patient with contacting the office to assist her, but patient declines, stating that it will be just as easy for her to communicate with the office directly.   Patient denies any further medication questions/concerns at this time.    Objective: Lab Results  Component Value Date   CREATININE 1.29 (H) 07/20/2018   CREATININE 1.43 (H) 05/18/2018   CREATININE 1.49 (H) 05/05/2018    Lab Results  Component Value Date   HGBA1C 8.4 (H) 07/20/2018    Lipid Panel     Component Value Date/Time   CHOL 157 07/20/2018 1729   TRIG 221 (H) 07/20/2018 1729   HDL 41 (L) 07/20/2018 1729   CHOLHDL 3.8 07/20/2018 1729   VLDL 36 (H)  01/15/2017 1619   LDLCALC 85 07/20/2018 1729    BP Readings from Last 3 Encounters:  07/20/18 (!) 144/78  05/18/18 134/84  04/15/18 130/66    Allergies  Allergen Reactions  . Sulfa Antibiotics Other (See Comments)    Pt states she had "extreme pain"  . Sulfacetamide Sodium      Assessment:  Barrier identified: . Medication previously misplaced. Patient counseled on adherence strategy   Plan:  Will close pharmacy episode.   Harlow Asa, PharmD, [place] [facility] [phone]

## 2018-10-13 ENCOUNTER — Other Ambulatory Visit: Payer: Self-pay | Admitting: Internal Medicine

## 2018-10-13 ENCOUNTER — Other Ambulatory Visit: Payer: Self-pay | Admitting: Adult Health

## 2018-10-13 DIAGNOSIS — E039 Hypothyroidism, unspecified: Secondary | ICD-10-CM

## 2018-10-21 ENCOUNTER — Ambulatory Visit: Payer: Self-pay | Admitting: Internal Medicine

## 2018-11-02 ENCOUNTER — Ambulatory Visit: Payer: Self-pay | Admitting: Internal Medicine

## 2018-11-16 ENCOUNTER — Ambulatory Visit: Payer: Self-pay | Admitting: Internal Medicine

## 2018-11-16 ENCOUNTER — Other Ambulatory Visit: Payer: Self-pay

## 2018-11-16 ENCOUNTER — Encounter: Payer: Self-pay | Admitting: Internal Medicine

## 2018-11-16 ENCOUNTER — Ambulatory Visit: Payer: PPO | Admitting: Internal Medicine

## 2018-11-16 DIAGNOSIS — E782 Mixed hyperlipidemia: Secondary | ICD-10-CM | POA: Diagnosis not present

## 2018-11-16 DIAGNOSIS — Z794 Long term (current) use of insulin: Secondary | ICD-10-CM

## 2018-11-16 DIAGNOSIS — N183 Chronic kidney disease, stage 3 unspecified: Secondary | ICD-10-CM

## 2018-11-16 DIAGNOSIS — E559 Vitamin D deficiency, unspecified: Secondary | ICD-10-CM

## 2018-11-16 DIAGNOSIS — E039 Hypothyroidism, unspecified: Secondary | ICD-10-CM | POA: Diagnosis not present

## 2018-11-16 DIAGNOSIS — K219 Gastro-esophageal reflux disease without esophagitis: Secondary | ICD-10-CM

## 2018-11-16 DIAGNOSIS — I10 Essential (primary) hypertension: Secondary | ICD-10-CM | POA: Diagnosis not present

## 2018-11-16 DIAGNOSIS — E1122 Type 2 diabetes mellitus with diabetic chronic kidney disease: Secondary | ICD-10-CM | POA: Diagnosis not present

## 2018-11-16 DIAGNOSIS — M1000 Idiopathic gout, unspecified site: Secondary | ICD-10-CM | POA: Diagnosis not present

## 2018-11-16 MED ORDER — GABAPENTIN 600 MG PO TABS: ORAL_TABLET | ORAL | 3 refills | Status: DC

## 2018-11-16 NOTE — Patient Instructions (Signed)

## 2018-11-16 NOTE — Progress Notes (Signed)
THIS ENCOUNTER IS A VIRTUAL VISIT DUE TO COVID-19 - PATIENT WAS NOT SEEN IN THE OFFICE.  PATIENT HAS CONSENTED TO VIRTUAL VISIT / TELEMEDICINE VISIT   Virtual Visit via telephone Note  I connected with Ashley [name] on 11/16/18  by telephone.  I verified that I am speaking with the correct person using two identifiers.    I discussed the limitations of evaluation and management by telemedicine and the availability of in person appointments. The patient expressed understanding and agreed to proceed.  History of Present Illness:     This very nice 73 y.o. MWF presents for 6 month follow up with HTN, HLD, T2_NIDDM, Gout and Vitamin D Deficiency. Patient has  controlled on her meds. She also has GERD likewise contolled with her meds. Patient has hx/o Bipolar Manic Depressive Disorder and is followed by Dr [name].      Patient is treated for HTN (1984)  & BP has been controlled at home.  Patient has had no complaints of any cardiac type chest pain, palpitations, dyspnea / orthopnea / PND, dizziness, claudication, or dependent edema.     Hyperlipidemia is controlled with diet & meds. Patient denies myalgias or other med SEs. Last Lipids were at goal albeit elevated Trig's: Lab Results  Component Value Date   CHOL 157 07/20/2018   HDL 41 (L) 07/20/2018   LDLCALC 85 07/20/2018   TRIG 221 (H) 07/20/2018   CHOLHDL 3.8 07/20/2018      Also, the patient has history of T2_NIDDM since 1998 and has associated CKD3, peripheral sensory Neuropathy and hx/o mild retinopathy. She has had no symptoms of reactive hypoglycemia, diabetic polys, paresthesias or visual blurring.   Patient has compulsive overeating or Gluttonous eating and her husband is her enabeler frequently bring her home various fast foods, sweets and other foods that she should not be eating.  Her last A1c was not at goal and she acts very indifferent or cavalier to the fact that it is so out of control: Lab Results  Component Value Date     HGBA1C 8.4 (H) 07/20/2018      Further, the patient also has history of Vitamin D Deficiency ("30" / 2012) and supplements vitamin D without any suspected side-effects. Last vitamin D was still not at goal (70-100): Lab Results  Component Value Date   VD25OH 51 07/20/2018   Medications    glipiZIDE 5 MG tablet, TAKE 1 TABLET  3 TIMES A DAY WITH MEALS   levothyroxine  100 MCG tablet, TAKE 1 TABLET  EVERY DAY   metFORMIN-XR 500 MG 24 hr tablet, TAKE 2 TABLETS  TWICE A DAY   NOVOLIN N 100 UNIT/ML , INJECT 50 UNITS MORNING AND 25 UNITS  EVENING   atorvastatin 40 MG tablet, Take 1 tablet  daily.   fenofibrate  145 MG tablet, Take 1 tabletdaily.   hydrochlorothiazide  12.5 MG capsule, Take  daily.   losartan  100 MG tablet, TAKE 1 TABLET  EVERY DAY    ipratropium 0.06 % nasal spray, Use 1 to 2 sprays each nostril 2 to 3 x / day as needed   aspirin EC 81 MG tablet, Take  daily.   clonazePAM 1 MG tablet, Take 1 mg 2  times daily as needed  for anxiety.    FLUoxetine  40 MG capsule, TAKE ONE CAPSULE EVERY DAY   gabapentin (NEURONTIN) 600 MG tablet, Take 1/2 to 1 tablet 1 hour before Bedtime   OVER THE COUNTER MEDICATION,  Takes Biotin 1 tablet daily.   ranitidine  300 MG tablet, TAKE 1 TABLET2 (TWO) TIMES DAILY.   Zinc 50 MG CAPS, Take 1 capsule daily   CINNAMON PO, Take 1 capsule  daily.  Problem list She has Major depressive disorder, recurrent episode ([place]); Essential hypertension; COPD; Other vitamin B12 deficiency anemia; Carpal tunnel syndrome; Diabetic Peripheral Neuropathy; Abnormality of gait; Vitamin D deficiency; Medication management; Bipolar depression ([place]); Non compliance w medication regimen; Gout; Hypothyroidism; GERD (gastroesophageal reflux disease); Diabetic CKD 3  (GFR 44 ml/min) ([place]); Gluttomy (Compulsive Eating Behavior Disorder); Frequent falls; Memory difficulty; Hyperlipidemia associated with type 2 diabetes mellitus ([place]); Mild nonproliferative  diabetic retinopathy of both eyes without macular edema associated with type 2 diabetes mellitus ([place]); Senile purpura ([place]); Diarrhea; Polyp of ascending colon; Leg swelling; Peripheral arterial disease ([place]); Diabetes mellitus type 2, uncontrolled ([place]); and Obesity (BMI 30.0-34.9) on their problem list.   Observations/Objective:  Speech was fluent & clear talking in full sentences w/o halting or pause and no sounds of wheezing , cough, jhoarseness or distress.  Assessment and Plan:  1. Essential hypertension  2. Hyperlipidemia, mixed  3. Type 2 diabetes mellitus with stage 3 chronic kidney disease, with long-term current use of insulin ([place])  4. Vitamin D deficiency  5. Hypothyroidism  6. Gastroesophageal reflux disease  7. Idiopathic gout  Follow Up Instructions:   Discussed  regular exercise, BP monitoring, weight control to achieve/maintain BMI less than 25 and discussed med and SE's. Recommended labs to assess and monitor clinical status with further disposition pending results of labs.   I discussed the assessment and treatment plan with the patient. The patient was provided an opportunity to ask questions and all were answered. The patient agreed with the plan and demonstrated an understanding of the instructions.   The patient was advised to call back or seek an in-person evaluation if the symptoms worsen or if the condition fails to improve as anticipated. Patient was advised to obtain a weight scale & BP cuff to monitor Vital signs for next tele Visit.   I provided 25 minutes of non-face-to-face time during this encounter and  over 35 minutes of exam, counseling, chart review was performed.   Kirtland Bouchard, MD

## 2018-12-01 ENCOUNTER — Other Ambulatory Visit: Payer: Self-pay | Admitting: Internal Medicine

## 2018-12-01 MED ORDER — FAMOTIDINE 20 MG PO TABS: ORAL_TABLET | ORAL | 3 refills | Status: DC

## 2018-12-06 ENCOUNTER — Other Ambulatory Visit: Payer: Self-pay | Admitting: Internal Medicine

## 2018-12-20 ENCOUNTER — Other Ambulatory Visit: Payer: Self-pay | Admitting: Internal Medicine

## 2018-12-20 ENCOUNTER — Telehealth: Payer: Self-pay | Admitting: Physician Assistant

## 2018-12-20 NOTE — Telephone Encounter (Signed)
-----   Message from Elenor Quinones, [place] sent at 12/20/2018 10:07 AM EDT ----- Regarding: recall Per PHARMACY......[facility] [address]   Zantac has been pulled off the market. Please fax a NEW rx for alternative medication. Please & thank you!

## 2019-01-19 ENCOUNTER — Other Ambulatory Visit: Payer: Self-pay | Admitting: Physician Assistant

## 2019-01-25 ENCOUNTER — Other Ambulatory Visit: Payer: Self-pay | Admitting: Internal Medicine

## 2019-01-25 DIAGNOSIS — E119 Type 2 diabetes mellitus without complications: Secondary | ICD-10-CM

## 2019-01-25 DIAGNOSIS — Z794 Long term (current) use of insulin: Secondary | ICD-10-CM

## 2019-02-17 ENCOUNTER — Ambulatory Visit: Payer: PPO | Admitting: Physician Assistant

## 2019-02-21 DIAGNOSIS — Z794 Long term (current) use of insulin: Secondary | ICD-10-CM | POA: Insufficient documentation

## 2019-02-21 NOTE — Progress Notes (Addendum)
THIS ENCOUNTER IS A VIRTUAL/TELEVIDEO VISIT DUE TO COVID-19 - PATIENT WAS NOT SEEN IN THE OFFICE.  PATIENT HAS CONSENTED TO VIRTUAL VISIT / TELEVIDEO VISIT  This provider placed a call to [employer], her appointment was changed to a virtual office visit to reduce the risk of exposure to the COVID-19 virus and to help Charlann L Haseman remain healthy and safe. The virtual visit will also provide continuity of care. She verbalizes understanding.    MEDICARE ANNUAL WELLNESS VISIT AND FOLLOW UP  Assessment:    Medicare annual wellness visit, subsequent Continue yearly  Essential hypertension Continue on current regiment Check blood pressure at home  Senile purpura ([place]) Monitor skin for changes Discussed safety with ASA  Peripheral arterial disease ([place]) Doing well at this time LONG discussion need to control sugars but patient has very poor insight to disease  Gastroesophageal reflux disease, esophagitis presence not specified Doing well   Hypothyroidism, unspecified type Taking levothyroxine 187mcg daily  Diabetic CKD 3  (GFR 44 ml/min) (HCC) Increase fluids, avoid NSAIDS, monitor sugars, will monitor  Hyperlipidemia associated with type 2 diabetes mellitus ([place]) Continue current regiment Discussed dietary modifications, patient not open to this  Uncontrolled type 2 diabetes mellitus with hyperglycemia ([place]) Discussed dietary modifications to improve health and monitor diabetes STOP drinking 8 extra large cups of soda from [employer] daily!  Diabetic Peripheral Neuropathy Continue medications: Continue diet and exercise.  Perform daily foot/skin check, notify office of any concerning changes.   Severe episode of recurrent major depressive disorder, without psychotic features ([place]) Doing well at this time Taking prozac saily  Other vitamin B12 deficiency anemia Continue supplement  Abnormality of gait Using four prong cane to assist with ambulation  Vitamin D  deficiency Continue supplementation  Medication management  Idiopathic gout, unspecified chronicity, unspecified site No medications at this time  Non compliance w medication regimen Discussed this at length with patient Denies any difficultly with medications  Gluttomy (Compulsive Eating Behavior Disorder) Discussed dietary changes with patient  Frequent falls Discussed safety for patient in and out of the home  Memory difficulty continue to monitor   LAB only will schedule Over 40 minutes of exam, counseling, chart review and critical decision making was performed Future Appointments  Date Time Provider [place]  05/24/2019  3:00 PM Unk Pinto, MD [facility] None  07/26/2019  4:15 PM Garnet Sierras, NP [facility] None     Plan:   During the course of the visit the patient was educated and counseled about appropriate screening and preventive services including:    Pneumococcal vaccine   Prevnar 13  Influenza vaccine  Td vaccine  Screening electrocardiogram  Bone densitometry screening  Colorectal cancer screening  Diabetes screening  Glaucoma screening  Nutrition counseling   Advanced directives: requested   Subjective:  Ashley [name] is a 73 y.o. female who presents for Medicare Annual Wellness Visit and 3 month follow up.    She is concentric with conversation and unable to give a direct answer to any type of questioning. he has hisoty of HTN, HLD, DMII with nueropathy, hypothyroidism, depression, GERD, PAD, CKD, abnormal gait, vit D & B12 defciencies, and frequent falls. Lab Results  Component Value Date   [id number] 1,448 (H) 07/20/2018   She has had elevated blood pressure for 35 years. Her blood pressure has been controlled at home, today their BP is  .  Reports she did not take her blood pressure medications today. BMI is Body mass index  is 29.29 kg/m., she is working on diet and exercise. Wt Readings from Last 3 Encounters:   07/20/18 168 lb (76.2 kg)  05/18/18 168 lb 12.8 oz (76.6 kg)  04/15/18 168 lb (76.2 kg)   She follows with Dr. [name] for bipolar manic depressive disorder, follows every 6 months. She has not been out of her house since March, normally she would go to eat with her girlfriends and go shopping. She is no longer driving x 2 years.  She uses a cane. Does her own meds other than the insulin.  She does not cook due to back pain, mainly eats out.   She does not workout. She denies chest pain, shortness of breath, dizziness.  She is on cholesterol medication and denies myalgias. Her cholesterol is not at goal. The cholesterol last visit was:   Lab Results  Component Value Date   CHOL 157 07/20/2018   HDL 41 (L) 07/20/2018   LDLCALC 85 07/20/2018   TRIG 221 (H) 07/20/2018   CHOLHDL 3.8 07/20/2018   She has had diabetes x 1998 CKD stage 3 she is on ARB Hyperlipemia on lipitor Neuropathy gabapentin retinopathy On insulin novlin 50/25- husband has to do insulin shots for her but she  manages the medication- does "sliding scale" She is on metformin.  She states morning sugars range from 120 to 200.    She has not been working on diet and exercise for diabetes, she continue to drink soda and eat fast food, she has very poor insight to disease process and very little motivation and denies foot ulcerations, hyperglycemia, hypoglycemia , nausea, paresthesia of the feet, polydipsia and polyuria.  Last A1C in the office was:  Lab Results  Component Value Date   HGBA1C 8.4 (H) 07/20/2018   Last GFR (still on metformin) Lab Results  Component Value Date   GFRNONAA 42 (L) 07/20/2018   Patient is on Vitamin D supplement.   Lab Results  Component Value Date   VD25OH 51 07/20/2018     She is on thyroid medication. Her medication was not changed last visit.   Lab Results  Component Value Date   TSH 2.41 07/20/2018  .   Medication Review: Current Outpatient Medications on File Prior to Visit   Medication Sig Dispense Refill  . aspirin EC 81 MG tablet Take 81 mg by mouth daily.    Marland Kitchen atorvastatin (LIPITOR) 40 MG tablet TAKE 1 TABLET DAILY BY MOUTH. 90 tablet 1  . CINNAMON PO Take 1 capsule by mouth daily.    . clonazePAM (KLONOPIN) 1 MG tablet Take 1 mg 2 (two) times daily as needed by mouth for anxiety.     . famotidine (PEPCID) 20 MG tablet Take 1 tablet 2 x /day with meals for Acid Indigestion 180 tablet 3  . fenofibrate (TRICOR) 145 MG tablet Take 1 tablet (145 mg total) by mouth daily. 90 tablet 1  . FLUoxetine (PROZAC) 40 MG capsule TAKE ONE CAPSULE BY MOUTH EVERY DAY 90 capsule 3  . gabapentin (NEURONTIN) 600 MG tablet Take 1/2- 1 tablet 2 x /day at Breakfast & Bedtime for Diabetic Neuropathy pains 180 tablet 3  . glipiZIDE (GLUCOTROL) 5 MG tablet TAKE 1 TABLET BY MOUTH 3 TIMES A DAY WITH MEALS 270 tablet 1  . glucose blood (ONE TOUCH ULTRA TEST) test strip TEST BLOOD SUGAR 3 TIMES A DAY (DX E11.9) 300 each 1  . hydrochlorothiazide (MICROZIDE) 12.5 MG capsule Take 12.5 mg by mouth daily.    Marland Kitchen  ipratropium (ATROVENT) 0.06 % nasal spray Use 1 to 2 sprays each nostril 2 to 3 x / day as needed 15 mL 11  . levothyroxine (SYNTHROID, LEVOTHROID) 100 MCG tablet TAKE 1 TABLET BY MOUTH EVERY DAY 90 tablet 1  . losartan (COZAAR) 100 MG tablet TAKE 1 TABLET BY MOUTH EVERY DAY FOR BLOOD PRESSURE AND KIDNEY PROTECTION 90 tablet 1  . metFORMIN (GLUCOPHAGE-XR) 500 MG 24 hr tablet TAKE 2 TABLETS BY MOUTH TWICE A DAY 360 tablet 1  . NOVOLIN N 100 UNIT/ML injection INJECT 50 UNITS EVERY MORNING AND 25 UNITS EVERY EVENING 30 mL 4  . OVER THE COUNTER MEDICATION Takes Biotin 1 tablet daily.    . ranitidine (ZANTAC) 300 MG tablet TAKE 1 TABLET (300 MG TOTAL) BY MOUTH 2 (TWO) TIMES DAILY. 180 tablet 3  . Zinc 50 MG CAPS Take 1 capsule by mouth.     No current facility-administered medications on file prior to visit.     Allergies  Allergen Reactions  . Sulfa Antibiotics Other (See Comments)    Pt  states she had "extreme pain"  . Sulfacetamide Sodium     Current Problems (verified) Patient Active Problem List   Diagnosis Date Noted  . Long term (current) use of insulin ([place]) 02/21/2019  . Obesity (BMI 30.0-34.9) 01/06/2018  . Diabetes mellitus type 2, uncontrolled ([place]) 12/17/2017  . Peripheral arterial disease ([place]) 10/20/2017  . Polyp of ascending colon   . Mild nonproliferative diabetic retinopathy of both eyes without macular edema associated with type 2 diabetes mellitus ([place]) 06/09/2017  . Senile purpura ([place]) 06/09/2017  . Hyperlipidemia associated with type 2 diabetes mellitus ([place]) 06/08/2017  . Memory difficulty 08/14/2016  . Frequent falls 04/08/2016  . Hypothyroidism 03/05/2016  . GERD (gastroesophageal reflux disease) 03/05/2016  . Diabetic CKD 3  (GFR 44 ml/min) (HCC) 03/05/2016  . Gout 08/29/2015  . Non compliance w medication regimen 02/20/2015  . Bipolar depression ([place]) 11/06/2014  . Vitamin D deficiency 12/01/2013  . Medication management 12/01/2013  . Abnormality of gait 03/10/2013  . Other vitamin B12 deficiency anemia 10/25/2012  . Carpal tunnel syndrome 10/25/2012  . Diabetic Peripheral Neuropathy 10/25/2012  . Major depressive disorder, recurrent episode ([place]) 10/12/2009  . Essential hypertension 10/12/2009  . COPD 10/12/2009    Screening Tests Immunization History  Administered Date(s) Administered  . Pneumococcal Conjugate-13 07/26/2014  . Pneumococcal-Unspecified 11/16/2012  . Td 08/18/2008  . Zoster 10/03/2008   Preventative care: Last colonoscopy: 2019 Last mammogram: 01/05/2018 Last pap smear/pelvic exam: 2015   DEXA:2009 MRI/MRA: 2017 US soft tissue head: 2016  Prior vaccinations: TD or Tdap: 2010  Influenza: Declines Pneumococcal: 2014 Prevnar13: 2015 Shingles/Zostavax: Declined  Names of Other Physician/Practitioners you currently use: 1. [facility] here for primary care 2. Eye  exam 12/2017 3. Dentist 2019 Patient Care Team: Unk Pinto, MD as PCP - General (Internal Medicine) Chucky May, MD as Consulting Physician (Psychiatry) [name], MD as Attending Physician (Obstetrics and Gynecology) [name], MD as Attending Physician (Gastroenterology)  SURGICAL HISTORY She  has a past surgical history that includes Bladder suspension (1996); NASAL AND FACIAL SURGERY (1985); Vaginal hysterectomy (1985); Vulvectomy partial ([place]); Vulvar lesion removal (06/22/2012); and Colonoscopy with propofol (N/A, 10/01/2017). FAMILY HISTORY Her family history includes Heart disease in her father; Heart failure in her father; Hyperlipidemia in her father and mother; Hypertension in her brother and father; Stroke in her father and mother. SOCIAL HISTORY She  reports that she  has been smoking cigarettes. She has a 54.00 pack-year smoking history. She has never used smokeless tobacco. She reports that she does not drink alcohol or use drugs.   MEDICARE WELLNESS OBJECTIVES: Physical activity: Current Exercise Habits: The patient does not participate in regular exercise at present, Exercise limited by: orthopedic condition(s) Cardiac risk factors: Cardiac Risk Factors include: advanced age (>15men, >67 women);diabetes mellitus;dyslipidemia;hypertension;obesity (BMI >30kg/m2);sedentary lifestyle;smoking/ tobacco exposure Depression/mood screen:   Depression screen [facility] 2/9 02/22/2019  Decreased Interest 0  Down, Depressed, Hopeless 0  PHQ - 2 Score 0  Altered sleeping -  Tired, decreased energy -  Change in appetite -  Feeling bad or failure about yourself  -  Trouble concentrating -  Moving slowly or fidgety/restless -  Suicidal thoughts -  PHQ-9 Score -  Difficult doing work/chores -  Some recent data might be hidden    ADLs:  In your present state of health, do you have any difficulty performing the following activities: 02/22/2019 02/22/2019  Hearing? N N   Vision? N N  Difficulty concentrating or making decisions? Tempie Donning  Walking or climbing stairs? Y Y  Dressing or bathing? N N  Doing errands, shopping? Tempie Donning  Preparing Food and eating ? Tempie Donning  Comment states can not stand for long periods of time -  Using the Toilet? N N  In the past six months, have you accidently leaked urine? Y Y  Do you have problems with loss of bowel control? N N  Managing your Medications? N N  Managing your Finances? N N  Housekeeping or managing your Housekeeping? Y Y  Some recent data might be hidden     Cognitive Testing  Alert? Yes  Normal Appearance?Yes  Oriented to person? Yes  Place? Yes   Time? Yes  Recall of three objects?  1/3  EOL planning: Does Patient Have a Medical Advance Directive?: Yes Type of Advance Directive: Living will, Healthcare Power of Attorney Does patient want to make changes to medical advance directive?: No - Patient declined Copy of [place] in Chart?: No - copy requested  Review of Systems  Constitutional: Negative for chills, diaphoresis, fever, malaise/fatigue and weight loss.  HENT: Negative for congestion, ear discharge, ear pain, hearing loss, nosebleeds, sinus pain, sore throat and tinnitus.   Eyes: Negative for blurred vision, double vision, photophobia, pain, discharge and redness.  Respiratory: Negative for cough, hemoptysis, sputum production, shortness of breath, wheezing and stridor.   Cardiovascular: Positive for leg swelling. Negative for chest pain, palpitations, orthopnea, claudication and PND.  Gastrointestinal: Negative for abdominal pain, blood in stool, constipation, diarrhea, heartburn, melena, nausea and vomiting.  Genitourinary: Negative for dysuria, flank pain, frequency, hematuria and urgency.       Notes incontinence of bladder with some bowel leakage.  No change from last visit.  Musculoskeletal: Positive for back pain, falls, joint pain and myalgias. Negative for neck pain.  Skin:  Negative for itching and rash.  Neurological: Positive for sensory change. Negative for dizziness, tingling, tremors, speech change, focal weakness, seizures, loss of consciousness, weakness and headaches.  Psychiatric/Behavioral: Positive for memory loss. Negative for depression, hallucinations, substance abuse and suicidal ideas. The patient is not nervous/anxious and does not have insomnia.      Objective:     Today's Vitals   02/22/19 1456  Temp: (!) 97 F (36.1 C)  Height: 5' 3.5" (1.613 m)  PainSc: 4   PainLoc: Leg   Body mass index is 29.29 kg/m.  General Appearance:Well sounding, in no apparent distress.  ENT/Mouth: No hoarseness, No cough for duration of visit.  Respiratory: completing full sentences without distress, without audible wheeze Neuro: Awake and oriented X 3,  Psych:  Insight and Judgment appropriate.    Medicare Attestation I have personally reviewed: The patient's medical and social history Their use of alcohol, tobacco or illicit drugs Their current medications and supplements The patient's functional ability including ADLs,fall risks, home safety risks, cognitive, and hearing and visual impairment Diet and physical activities Evidence for depression or mood disorders  The patient's weight, height, BMI, and visual acuity have been recorded in the chart.  I have made referrals, counseling, and provided education to the patient based on review of the above and I have provided the patient with a written personalized care plan for preventive services.     Vicie Mutters, PA-C   02/22/2019

## 2019-02-22 ENCOUNTER — Encounter: Payer: Self-pay | Admitting: Physician Assistant

## 2019-02-22 ENCOUNTER — Ambulatory Visit: Payer: PPO | Admitting: Physician Assistant

## 2019-02-22 ENCOUNTER — Other Ambulatory Visit: Payer: Self-pay

## 2019-02-22 VITALS — Temp 97.0°F | Ht 63.5 in

## 2019-02-22 DIAGNOSIS — E559 Vitamin D deficiency, unspecified: Secondary | ICD-10-CM

## 2019-02-22 DIAGNOSIS — E1169 Type 2 diabetes mellitus with other specified complication: Secondary | ICD-10-CM | POA: Diagnosis not present

## 2019-02-22 DIAGNOSIS — D122 Benign neoplasm of ascending colon: Secondary | ICD-10-CM

## 2019-02-22 DIAGNOSIS — Z0001 Encounter for general adult medical examination with abnormal findings: Secondary | ICD-10-CM

## 2019-02-22 DIAGNOSIS — R269 Unspecified abnormalities of gait and mobility: Secondary | ICD-10-CM

## 2019-02-22 DIAGNOSIS — E039 Hypothyroidism, unspecified: Secondary | ICD-10-CM

## 2019-02-22 DIAGNOSIS — D692 Other nonthrombocytopenic purpura: Secondary | ICD-10-CM

## 2019-02-22 DIAGNOSIS — E1122 Type 2 diabetes mellitus with diabetic chronic kidney disease: Secondary | ICD-10-CM | POA: Diagnosis not present

## 2019-02-22 DIAGNOSIS — F319 Bipolar disorder, unspecified: Secondary | ICD-10-CM | POA: Diagnosis not present

## 2019-02-22 DIAGNOSIS — Z91148 Patient's other noncompliance with medication regimen for other reason: Secondary | ICD-10-CM

## 2019-02-22 DIAGNOSIS — E1165 Type 2 diabetes mellitus with hyperglycemia: Secondary | ICD-10-CM

## 2019-02-22 DIAGNOSIS — N183 Chronic kidney disease, stage 3 (moderate): Secondary | ICD-10-CM

## 2019-02-22 DIAGNOSIS — E669 Obesity, unspecified: Secondary | ICD-10-CM

## 2019-02-22 DIAGNOSIS — R6889 Other general symptoms and signs: Secondary | ICD-10-CM

## 2019-02-22 DIAGNOSIS — R413 Other amnesia: Secondary | ICD-10-CM

## 2019-02-22 DIAGNOSIS — Z794 Long term (current) use of insulin: Secondary | ICD-10-CM | POA: Diagnosis not present

## 2019-02-22 DIAGNOSIS — D518 Other vitamin B12 deficiency anemias: Secondary | ICD-10-CM

## 2019-02-22 DIAGNOSIS — E66811 Obesity, class 1: Secondary | ICD-10-CM

## 2019-02-22 DIAGNOSIS — Z9114 Patient's other noncompliance with medication regimen: Secondary | ICD-10-CM

## 2019-02-22 DIAGNOSIS — E113293 Type 2 diabetes mellitus with mild nonproliferative diabetic retinopathy without macular edema, bilateral: Secondary | ICD-10-CM

## 2019-02-22 DIAGNOSIS — F332 Major depressive disorder, recurrent severe without psychotic features: Secondary | ICD-10-CM

## 2019-02-22 DIAGNOSIS — M1000 Idiopathic gout, unspecified site: Secondary | ICD-10-CM

## 2019-02-22 DIAGNOSIS — J438 Other emphysema: Secondary | ICD-10-CM

## 2019-02-22 DIAGNOSIS — G63 Polyneuropathy in diseases classified elsewhere: Secondary | ICD-10-CM

## 2019-02-22 DIAGNOSIS — R296 Repeated falls: Secondary | ICD-10-CM

## 2019-02-22 DIAGNOSIS — I10 Essential (primary) hypertension: Secondary | ICD-10-CM

## 2019-02-22 DIAGNOSIS — K219 Gastro-esophageal reflux disease without esophagitis: Secondary | ICD-10-CM

## 2019-02-22 DIAGNOSIS — E785 Hyperlipidemia, unspecified: Secondary | ICD-10-CM

## 2019-02-22 DIAGNOSIS — I739 Peripheral vascular disease, unspecified: Secondary | ICD-10-CM | POA: Diagnosis not present

## 2019-02-22 DIAGNOSIS — K635 Polyp of colon: Secondary | ICD-10-CM

## 2019-02-22 DIAGNOSIS — Z79899 Other long term (current) drug therapy: Secondary | ICD-10-CM

## 2019-02-22 DIAGNOSIS — Z0000 Encounter for general adult medical examination without abnormal findings: Secondary | ICD-10-CM

## 2019-03-09 ENCOUNTER — Other Ambulatory Visit: Payer: Self-pay | Admitting: Internal Medicine

## 2019-03-10 ENCOUNTER — Other Ambulatory Visit: Payer: Self-pay

## 2019-03-10 ENCOUNTER — Ambulatory Visit (INDEPENDENT_AMBULATORY_CARE_PROVIDER_SITE_OTHER): Payer: PPO

## 2019-03-10 VITALS — BP 138/74 | HR 100 | Temp 97.3°F | Wt 183.6 lb

## 2019-03-10 DIAGNOSIS — I739 Peripheral vascular disease, unspecified: Secondary | ICD-10-CM

## 2019-03-10 DIAGNOSIS — D518 Other vitamin B12 deficiency anemias: Secondary | ICD-10-CM | POA: Diagnosis not present

## 2019-03-10 DIAGNOSIS — N183 Chronic kidney disease, stage 3 (moderate): Secondary | ICD-10-CM | POA: Diagnosis not present

## 2019-03-10 DIAGNOSIS — E559 Vitamin D deficiency, unspecified: Secondary | ICD-10-CM | POA: Diagnosis not present

## 2019-03-10 DIAGNOSIS — I10 Essential (primary) hypertension: Secondary | ICD-10-CM | POA: Diagnosis not present

## 2019-03-10 DIAGNOSIS — J438 Other emphysema: Secondary | ICD-10-CM

## 2019-03-10 DIAGNOSIS — E1122 Type 2 diabetes mellitus with diabetic chronic kidney disease: Secondary | ICD-10-CM | POA: Diagnosis not present

## 2019-03-10 DIAGNOSIS — Z79899 Other long term (current) drug therapy: Secondary | ICD-10-CM

## 2019-03-10 DIAGNOSIS — E1169 Type 2 diabetes mellitus with other specified complication: Secondary | ICD-10-CM

## 2019-03-10 DIAGNOSIS — E039 Hypothyroidism, unspecified: Secondary | ICD-10-CM | POA: Diagnosis not present

## 2019-03-10 DIAGNOSIS — E785 Hyperlipidemia, unspecified: Secondary | ICD-10-CM

## 2019-03-10 NOTE — Progress Notes (Signed)
Patient reports for blood work Labs were entered into epic by provider. Vitals entered as well

## 2019-03-11 LAB — COMPLETE METABOLIC PANEL WITH GFR
AG Ratio: 1.5 (calc) (ref 1.0–2.5)
ALT: 24 U/L (ref 6–29)
AST: 25 U/L (ref 10–35)
Albumin: 3.6 g/dL (ref 3.6–5.1)
Alkaline phosphatase (APISO): 27 U/L — ABNORMAL LOW (ref 37–153)
BUN/Creatinine Ratio: 12 (calc) (ref 6–22)
BUN: 22 mg/dL (ref 7–25)
CO2: 24 mmol/L (ref 20–32)
Calcium: 9.7 mg/dL (ref 8.6–10.4)
Chloride: 99 mmol/L (ref 98–110)
Creat: 1.79 mg/dL — ABNORMAL HIGH (ref 0.60–0.93)
GFR, Est African American: 32 mL/min/{1.73_m2} — ABNORMAL LOW (ref >=60)
GFR, Est Non African American: 28 mL/min/{1.73_m2} — ABNORMAL LOW (ref >=60)
Globulin: 2.4 g/dL (calc) (ref 1.9–3.7)
Glucose, Bld: 203 mg/dL — ABNORMAL HIGH (ref 65–99)
Potassium: 4 mmol/L (ref 3.5–5.3)
Sodium: 136 mmol/L (ref 135–146)
Total Bilirubin: 0.3 mg/dL (ref 0.2–1.2)
Total Protein: 6 g/dL — ABNORMAL LOW (ref 6.1–8.1)

## 2019-03-11 LAB — CBC WITH DIFFERENTIAL/PLATELET
Absolute Monocytes: 433 cells/uL (ref 200–950)
Basophils Absolute: 31 cells/uL (ref 0–200)
Basophils Relative: 0.5 %
Eosinophils Absolute: 61 cells/uL (ref 15–500)
Eosinophils Relative: 1 %
HCT: 32.1 % — ABNORMAL LOW (ref 35.0–45.0)
Hemoglobin: 10.6 g/dL — ABNORMAL LOW (ref 11.7–15.5)
Lymphs Abs: 1482 cells/uL (ref 850–3900)
MCH: 29.4 pg (ref 27.0–33.0)
MCHC: 33 g/dL (ref 32.0–36.0)
MCV: 88.9 fL (ref 80.0–100.0)
MPV: 10.5 fL (ref 7.5–12.5)
Monocytes Relative: 7.1 %
Neutro Abs: 4093 cells/uL (ref 1500–7800)
Neutrophils Relative %: 67.1 %
Platelets: 350 10*3/uL (ref 140–400)
RBC: 3.61 10*6/uL — ABNORMAL LOW (ref 3.80–5.10)
RDW: 13.1 % (ref 11.0–15.0)
Total Lymphocyte: 24.3 %
WBC: 6.1 10*3/uL (ref 3.8–10.8)

## 2019-03-11 LAB — LIPID PANEL
Cholesterol: 167 mg/dL (ref <200)
HDL: 32 mg/dL — ABNORMAL LOW (ref >=50)
LDL Cholesterol (Calc): 87 mg/dL (calc)
Non-HDL Cholesterol (Calc): 135 mg/dL (calc) — ABNORMAL HIGH (ref <130)
Total CHOL/HDL Ratio: 5.2 (calc) — ABNORMAL HIGH (ref <5.0)
Triglycerides: 357 mg/dL — ABNORMAL HIGH (ref <150)

## 2019-03-11 LAB — TSH: TSH: 8.27 mIU/L — ABNORMAL HIGH (ref 0.40–4.50)

## 2019-03-11 LAB — HEMOGLOBIN A1C
Hgb A1c MFr Bld: 9.2 % of total Hgb — ABNORMAL HIGH (ref <5.7)
Mean Plasma Glucose: 217 (calc)
eAG (mmol/L): 12 (calc)

## 2019-03-11 LAB — VITAMIN B12: Vitamin B-12: 524 pg/mL (ref 200–1100)

## 2019-03-11 LAB — VITAMIN D 25 HYDROXY (VIT D DEFICIENCY, FRACTURES): Vit D, 25-Hydroxy: 46 ng/mL (ref 30–100)

## 2019-03-11 LAB — MAGNESIUM: Magnesium: 1.3 mg/dL — ABNORMAL LOW (ref 1.5–2.5)

## 2019-03-17 ENCOUNTER — Telehealth: Payer: Self-pay

## 2019-03-17 NOTE — Telephone Encounter (Signed)
-----   Message from Vicie Mutters, [place] sent at 03/17/2019  1:29 PM EDT ----- Regarding: RE: [place]: [phone] Suggest phone or office visit.  Does she see GYN anymore? Can go there as well with history Estill Bamberg ----- Message ----- From: Elenor Quinones, CMA Sent: 03/17/2019  10:12 AM EDT To: Vicie Mutters, PA-C Subject: NEW SXS                                        HAS had "NEW" vaginal odor for approx 2 mths  Doesn't smell like a yeast infection.  Wears depends 24/7  Odor doesn't smell like urine  Denies itching, burning, frequency.  Patient would like something called into pharmacy, schedule a office visit, or phone visit

## 2019-03-17 NOTE — Telephone Encounter (Signed)
Will schedule phone visit

## 2019-03-21 ENCOUNTER — Ambulatory Visit (INDEPENDENT_AMBULATORY_CARE_PROVIDER_SITE_OTHER): Payer: PPO | Admitting: Physician Assistant

## 2019-03-21 ENCOUNTER — Other Ambulatory Visit: Payer: Self-pay

## 2019-03-21 ENCOUNTER — Encounter: Payer: Self-pay | Admitting: Physician Assistant

## 2019-03-21 DIAGNOSIS — E039 Hypothyroidism, unspecified: Secondary | ICD-10-CM

## 2019-03-21 DIAGNOSIS — N898 Other specified noninflammatory disorders of vagina: Secondary | ICD-10-CM

## 2019-03-21 DIAGNOSIS — D649 Anemia, unspecified: Secondary | ICD-10-CM | POA: Diagnosis not present

## 2019-03-21 MED ORDER — LEVOTHYROXINE SODIUM 100 MCG PO TABS: 100.0000 ug | ORAL_TABLET | Freq: Every day | ORAL | 1 refills | Status: DC

## 2019-03-21 MED ORDER — FLUCONAZOLE 150 MG PO TABS: 150.0000 mg | ORAL_TABLET | Freq: Once | ORAL | 0 refills | Status: AC

## 2019-03-21 MED ORDER — METRONIDAZOLE 500 MG PO TABS: 500.0000 mg | ORAL_TABLET | Freq: Two times a day (BID) | ORAL | 0 refills | Status: AC

## 2019-03-21 MED ORDER — FERROUS SULFATE 325 (65 FE) MG PO TBEC: DELAYED_RELEASE_TABLET | ORAL | 1 refills | Status: DC

## 2019-03-21 NOTE — Progress Notes (Signed)
THIS ENCOUNTER IS A VIRTUAL VISIT DUE TO COVID-19 - PATIENT WAS NOT SEEN IN THE OFFICE.  PATIENT HAS CONSENTED TO VIRTUAL VISIT / TELEMEDICINE VISIT   Virtual Visit via telephone Note  I connected with Ashley [name] on 03/21/2019 by telephone.  I verified that I am speaking with the correct person using two identifiers.    I discussed the limitations of evaluation and management by telemedicine and the availability of in person appointments. The patient expressed understanding and agreed to proceed.  History of Present Illness: 73 y.o. obese WF with history of uncontrolled DM due to non compliance calls with vaginal discharge with an odor x 1 month.   She denies any new soaps or detergents. She wears depends 24/7.  Last saw Dr. Skeet Latch 5 years ago.   She HAS NOT BEEN ON thyroid medication. Her medication was changed last visit, IT WAS SENT IN FOR HER.  Lab Results  Component Value Date   TSH 8.27 (H) 03/10/2019  .   She had anemia, she is going to get on thyroid.    She states her teeth are bad, and she can not afford to get it taken care of, may need them removed due to medical reasons.  Medications  Current Outpatient Medications (Endocrine & Metabolic):  .  glipiZIDE (GLUCOTROL) 5 MG tablet, TAKE 1 TABLET BY MOUTH 3 TIMES A DAY WITH MEALS .  levothyroxine (SYNTHROID, LEVOTHROID) 100 MCG tablet, TAKE 1 TABLET BY MOUTH EVERY DAY .  metFORMIN (GLUCOPHAGE-XR) 500 MG 24 hr tablet, TAKE 2 TABLETS BY MOUTH TWICE A DAY .  NOVOLIN N 100 UNIT/ML injection, INJECT 50 UNITS EVERY MORNING AND 25 UNITS EVERY EVENING  Current Outpatient Medications (Cardiovascular):  .  atorvastatin (LIPITOR) 40 MG tablet, TAKE 1 TABLET DAILY BY MOUTH. .  fenofibrate (TRICOR) 145 MG tablet, Take 1 tablet Daily for Triglycerides (Blood Fats) .  hydrochlorothiazide (MICROZIDE) 12.5 MG capsule, Take 12.5 mg by mouth daily. Marland Kitchen  losartan (COZAAR) 100 MG tablet, TAKE 1 TABLET BY MOUTH EVERY DAY FOR BLOOD PRESSURE AND  KIDNEY PROTECTION  Current Outpatient Medications (Respiratory):  .  ipratropium (ATROVENT) 0.06 % nasal spray, Use 1 to 2 sprays each nostril 2 to 3 x / day as needed  Current Outpatient Medications (Analgesics):  .  aspirin EC 81 MG tablet, Take 81 mg by mouth daily.   Current Outpatient Medications (Other):  Marland Kitchen  CINNAMON PO, Take 1 capsule by mouth daily. .  clonazePAM (KLONOPIN) 1 MG tablet, Take 1 mg 2 (two) times daily as needed by mouth for anxiety.  .  famotidine (PEPCID) 20 MG tablet, Take 1 tablet 2 x /day with meals for Acid Indigestion .  FLUoxetine (PROZAC) 40 MG capsule, TAKE ONE CAPSULE BY MOUTH EVERY DAY .  gabapentin (NEURONTIN) 600 MG tablet, Take 1/2- 1 tablet 2 x /day at Breakfast & Bedtime for Diabetic Neuropathy pains .  glucose blood (ONE TOUCH ULTRA TEST) test strip, TEST BLOOD SUGAR 3 TIMES A DAY (DX E11.9) .  OVER THE COUNTER MEDICATION, Takes Biotin 1 tablet daily. .  ranitidine (ZANTAC) 300 MG tablet, TAKE 1 TABLET (300 MG TOTAL) BY MOUTH 2 (TWO) TIMES DAILY. Marland Kitchen  Zinc 50 MG CAPS, Take 1 capsule by mouth.  Problem list She has Major depressive disorder, recurrent episode ([place]); Essential hypertension; COPD; Other vitamin B12 deficiency anemia; Carpal tunnel syndrome; Diabetic Peripheral Neuropathy; Abnormality of gait; Vitamin D deficiency; Medication management; Bipolar depression ([place]); Non compliance w medication regimen; Gout; Hypothyroidism;  GERD (gastroesophageal reflux disease); Diabetic CKD 3  (GFR 44 ml/min) ([place]); Frequent falls; Memory difficulty; Hyperlipidemia associated with type 2 diabetes mellitus ([place]); Mild nonproliferative diabetic retinopathy of both eyes without macular edema associated with type 2 diabetes mellitus ([place]); Senile purpura ([place]); Polyp of ascending colon; Peripheral arterial disease ([place]); Diabetes mellitus type 2, uncontrolled ([place]); Obesity (BMI 30.0-34.9); and Long term (current) use of insulin ([place]) on their problem list.    Observations/Objective: General Appearance:Well sounding, in no apparent distress.  ENT/Mouth: No hoarseness, No cough for duration of visit.  Respiratory: completing full sentences without distress, without audible wheeze Neuro: Awake and oriented X 3,  Psych:  Insight and Judgment appropriate.    Assessment and Plan:  Vaginal discharge If not better will refer back to GYN Had vulva surgery 5 years ago with Dr. Skeet Latch -     fluconazole (DIFLUCAN) 150 MG tablet; Take 1 tablet (150 mg total) by mouth once for 1 dose. -     metroNIDAZOLE (FLAGYL) 500 MG tablet; Take 1 tablet (500 mg total) by mouth 2 (two) times daily for 7 days.  Hypothyroidism -     levothyroxine (SYNTHROID) 100 MCG tablet; Take 1 tablet (100 mcg total) by mouth daily. Hypothyroidism-check TSH level, continue medications the same, reminded to take on an empty stomach 30-83mins before food.  GET BACK ON THYROID MEDICATION  Anemia, unspecified type -     ferrous sulfate 325 (65 FE) MG EC tablet; Two times daily with Vitamin C GET ON IRON, AND VITAMIN C  HAS CLOSE FOLLOW UP AUGUST    Follow Up Instructions:  I discussed the assessment and treatment plan with the patient. The patient was provided an opportunity to ask questions and all were answered. The patient agreed with the plan and demonstrated an understanding of the instructions.   The patient was advised to call back or seek an in-person evaluation if the symptoms worsen or if the condition fails to improve as anticipated.  I provided 20 minutes of non-face-to-face time during this encounter.  Future Appointments  Date Time Provider [place]  04/07/2019  2:00 PM Liane Comber, NP [facility] None  05/24/2019  3:00 PM Unk Pinto, MD [facility] None  07/26/2019  4:15 PM Garnet Sierras, NP [facility] None  03/06/2020  3:00 PM Garnet Sierras, NP [facility] None    Vicie Mutters, PA-C

## 2019-03-23 ENCOUNTER — Telehealth: Payer: Self-pay | Admitting: Physician Assistant

## 2019-03-23 NOTE — Telephone Encounter (Signed)
-----   Message from Elenor Quinones, [place] sent at 03/23/2019 10:14 AM EDT ----- Regarding: IRON PILL Contact: [phone] Patient wants to know if there is a pill she can take other then the one she is taking. She reports it taste & smell awful. Also she needs clarification on her medication...Marland KitchenEXACTLY when & what she should take.   Per office note

## 2019-03-23 NOTE — Telephone Encounter (Signed)
Patient has been made aware but states she will call back later today with medication questions

## 2019-04-05 ENCOUNTER — Telehealth: Payer: Self-pay

## 2019-04-05 ENCOUNTER — Telehealth: Payer: Self-pay | Admitting: Adult Health

## 2019-04-05 NOTE — Telephone Encounter (Signed)
Spoke with Ashley [name] & we went over MEDICATIONS.  She was unsure on how to take IRON pill. But I spoke with Ashley [name] on the 5th of Aug and patient was informed again from our phone call on the 5th of Aug. Patient states she forgot & also needed to cancel her appt with [facility] on the 20th of Aug.  I transferred her to the front office in order to cancel & reschedule her office visit.

## 2019-04-05 NOTE — Telephone Encounter (Signed)
patient called, does not know how or when to take certain medicines, needs assistance.  Wants to cancel ov due to not taking medicines accurately. Please have nurse call and advise your recommendations.

## 2019-04-05 NOTE — Telephone Encounter (Signed)
Patient has been informed on how to take [place]

## 2019-04-05 NOTE — Telephone Encounter (Signed)
MESSAGE sent to provider to get advise on how to take MEDS

## 2019-04-07 ENCOUNTER — Ambulatory Visit: Payer: PPO | Admitting: Adult Health

## 2019-04-09 ENCOUNTER — Other Ambulatory Visit: Payer: Self-pay | Admitting: Internal Medicine

## 2019-04-09 DIAGNOSIS — I10 Essential (primary) hypertension: Secondary | ICD-10-CM

## 2019-04-16 ENCOUNTER — Other Ambulatory Visit: Payer: Self-pay | Admitting: Internal Medicine

## 2019-04-20 ENCOUNTER — Telehealth: Payer: Self-pay | Admitting: *Deleted

## 2019-04-20 NOTE — Telephone Encounter (Signed)
Patient called with conecrns about continuing Metformin, for her and her husband.  Per Dr Melford Aase and the her [facility] pharmacist, there are only certain lots that are affected.  The pharmacist states his store has not had any of the affected lots.  The patient was advised to continue the medication and she indicated she would.

## 2019-04-27 ENCOUNTER — Telehealth: Payer: Self-pay

## 2019-04-27 NOTE — Telephone Encounter (Signed)
-----   Message from Vicie Mutters, [place] sent at 04/27/2019  8:49 AM EDT ----- Regarding: RE: stomach issues/OFFICE NOTE Contact: [phone] Needs to come into the office, labs were bad. If she is not willing to come into the office and is going against medical advise, we will advise her to find another physician.  Estill Bamberg ----- Message ----- From: Elenor Quinones, CMA Sent: 04/26/2019  10:08 AM EDT To: Vicie Mutters, PA-C Subject: stomach issues/OFFICE NOTE                     Per office note:  Patient does not want to come in but she does states she has an appt on 9/15.

## 2019-04-27 NOTE — Telephone Encounter (Signed)
PATIENT has scheduled an appt for her stomach concerns.  Appt on 04/28/2019 at 2:30pm
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
## 2019-04-28 ENCOUNTER — Other Ambulatory Visit: Payer: Self-pay

## 2019-04-28 ENCOUNTER — Ambulatory Visit (INDEPENDENT_AMBULATORY_CARE_PROVIDER_SITE_OTHER): Payer: PPO | Admitting: Internal Medicine

## 2019-04-28 VITALS — BP 152/96 | HR 84 | Temp 97.6°F | Resp 16 | Ht 63.5 in | Wt 184.6 lb

## 2019-04-28 DIAGNOSIS — E039 Hypothyroidism, unspecified: Secondary | ICD-10-CM | POA: Diagnosis not present

## 2019-04-28 DIAGNOSIS — E1122 Type 2 diabetes mellitus with diabetic chronic kidney disease: Secondary | ICD-10-CM | POA: Diagnosis not present

## 2019-04-28 DIAGNOSIS — I10 Essential (primary) hypertension: Secondary | ICD-10-CM

## 2019-04-28 DIAGNOSIS — N183 Chronic kidney disease, stage 3 unspecified: Secondary | ICD-10-CM

## 2019-04-28 DIAGNOSIS — N189 Chronic kidney disease, unspecified: Secondary | ICD-10-CM

## 2019-04-28 DIAGNOSIS — Z794 Long term (current) use of insulin: Secondary | ICD-10-CM

## 2019-04-28 DIAGNOSIS — D631 Anemia in chronic kidney disease: Secondary | ICD-10-CM | POA: Diagnosis not present

## 2019-04-28 DIAGNOSIS — Z9119 Patient's noncompliance with other medical treatment and regimen: Secondary | ICD-10-CM

## 2019-04-28 DIAGNOSIS — Z79899 Other long term (current) drug therapy: Secondary | ICD-10-CM | POA: Diagnosis not present

## 2019-04-28 DIAGNOSIS — Z91199 Patient's noncompliance with other medical treatment and regimen due to unspecified reason: Secondary | ICD-10-CM

## 2019-04-28 NOTE — Patient Instructions (Signed)

## 2019-04-29 ENCOUNTER — Encounter: Payer: Self-pay | Admitting: Internal Medicine

## 2019-05-03 ENCOUNTER — Ambulatory Visit: Payer: PPO | Admitting: Adult Health Nurse Practitioner

## 2019-05-15 ENCOUNTER — Emergency Department (HOSPITAL_COMMUNITY): Payer: PPO

## 2019-05-15 ENCOUNTER — Emergency Department (HOSPITAL_COMMUNITY)
Admission: EM | Admit: 2019-05-15 | Discharge: 2019-05-15 | Disposition: A | Discharge status: Home / Self Care | Payer: PPO | Attending: Emergency Medicine | Admitting: Emergency Medicine

## 2019-05-15 ENCOUNTER — Other Ambulatory Visit: Payer: Self-pay

## 2019-05-15 DIAGNOSIS — E860 Dehydration: Secondary | ICD-10-CM | POA: Insufficient documentation

## 2019-05-15 DIAGNOSIS — I129 Hypertensive chronic kidney disease with stage 1 through stage 4 chronic kidney disease, or unspecified chronic kidney disease: Secondary | ICD-10-CM | POA: Diagnosis not present

## 2019-05-15 DIAGNOSIS — N183 Chronic kidney disease, stage 3 (moderate): Secondary | ICD-10-CM | POA: Diagnosis not present

## 2019-05-15 DIAGNOSIS — R1084 Generalized abdominal pain: Secondary | ICD-10-CM | POA: Diagnosis not present

## 2019-05-15 DIAGNOSIS — Z8673 Personal history of transient ischemic attack (TIA), and cerebral infarction without residual deficits: Secondary | ICD-10-CM | POA: Diagnosis not present

## 2019-05-15 DIAGNOSIS — R112 Nausea with vomiting, unspecified: Secondary | ICD-10-CM | POA: Diagnosis not present

## 2019-05-15 DIAGNOSIS — F17210 Nicotine dependence, cigarettes, uncomplicated: Secondary | ICD-10-CM | POA: Diagnosis not present

## 2019-05-15 DIAGNOSIS — N179 Acute kidney failure, unspecified: Secondary | ICD-10-CM

## 2019-05-15 DIAGNOSIS — Z86002 Personal history of in-situ neoplasm of other and unspecified genital organs: Secondary | ICD-10-CM | POA: Diagnosis not present

## 2019-05-15 DIAGNOSIS — I10 Essential (primary) hypertension: Secondary | ICD-10-CM | POA: Diagnosis not present

## 2019-05-15 DIAGNOSIS — Z79899 Other long term (current) drug therapy: Secondary | ICD-10-CM | POA: Insufficient documentation

## 2019-05-15 DIAGNOSIS — J45909 Unspecified asthma, uncomplicated: Secondary | ICD-10-CM | POA: Diagnosis not present

## 2019-05-15 DIAGNOSIS — Z7982 Long term (current) use of aspirin: Secondary | ICD-10-CM | POA: Insufficient documentation

## 2019-05-15 DIAGNOSIS — R1111 Vomiting without nausea: Secondary | ICD-10-CM | POA: Diagnosis not present

## 2019-05-15 LAB — URINALYSIS, ROUTINE W REFLEX MICROSCOPIC
Bacteria, UA: NONE SEEN
Bilirubin Urine: NEGATIVE
Glucose, UA: 150 mg/dL — AB
Hgb urine dipstick: NEGATIVE
Ketones, ur: NEGATIVE mg/dL
Leukocytes,Ua: NEGATIVE
Nitrite: NEGATIVE
Protein, ur: >=300 mg/dL — AB
Specific Gravity, Urine: 1.026 (ref 1.005–1.030)
pH: 6 (ref 5.0–8.0)

## 2019-05-15 LAB — CBC WITH DIFFERENTIAL/PLATELET
Abs Immature Granulocytes: 0.04 10*3/uL (ref 0.00–0.07)
Basophils Absolute: 0 10*3/uL (ref 0.0–0.1)
Basophils Relative: 0 %
Eosinophils Absolute: 0 10*3/uL (ref 0.0–0.5)
Eosinophils Relative: 0 %
HCT: 40.8 % (ref 36.0–46.0)
Hemoglobin: 13.5 g/dL (ref 12.0–15.0)
Immature Granulocytes: 1 %
Lymphocytes Relative: 11 %
Lymphs Abs: 0.9 10*3/uL (ref 0.7–4.0)
MCH: 29.1 pg (ref 26.0–34.0)
MCHC: 33.1 g/dL (ref 30.0–36.0)
MCV: 87.9 fL (ref 80.0–100.0)
Monocytes Absolute: 0.5 10*3/uL (ref 0.1–1.0)
Monocytes Relative: 6 %
Neutro Abs: 6.9 10*3/uL (ref 1.7–7.7)
Neutrophils Relative %: 82 %
Platelets: 365 10*3/uL (ref 150–400)
RBC: 4.64 MIL/uL (ref 3.87–5.11)
RDW: 13.6 % (ref 11.5–15.5)
WBC: 8.4 10*3/uL (ref 4.0–10.5)
nRBC: 0 % (ref 0.0–0.2)

## 2019-05-15 LAB — COMPREHENSIVE METABOLIC PANEL
ALT: 29 U/L (ref 0–44)
AST: 48 U/L — ABNORMAL HIGH (ref 15–41)
Albumin: 3 g/dL — ABNORMAL LOW (ref 3.5–5.0)
Alkaline Phosphatase: 45 U/L (ref 38–126)
Anion gap: 12 (ref 5–15)
BUN: 31 mg/dL — ABNORMAL HIGH (ref 8–23)
CO2: 25 mmol/L (ref 22–32)
Calcium: 9.8 mg/dL (ref 8.9–10.3)
Chloride: 98 mmol/L (ref 98–111)
Creatinine, Ser: 2.2 mg/dL — ABNORMAL HIGH (ref 0.44–1.00)
GFR calc Af Amer: 25 mL/min — ABNORMAL LOW (ref >60)
GFR calc non Af Amer: 22 mL/min — ABNORMAL LOW (ref >60)
Glucose, Bld: 190 mg/dL — ABNORMAL HIGH (ref 70–99)
Potassium: 3.8 mmol/L (ref 3.5–5.1)
Sodium: 135 mmol/L (ref 135–145)
Total Bilirubin: 1 mg/dL (ref 0.3–1.2)
Total Protein: 6.7 g/dL (ref 6.5–8.1)

## 2019-05-15 LAB — LIPASE, BLOOD: Lipase: 24 U/L (ref 11–51)

## 2019-05-15 MED ORDER — SODIUM CHLORIDE 0.9 % IV SOLN
INTRAVENOUS | Status: DC
  Administered 2019-05-15: 17:00:00 via INTRAVENOUS

## 2019-05-15 MED ORDER — AMLODIPINE BESYLATE 5 MG PO TABS
5.0000 mg | ORAL_TABLET | Freq: Once | ORAL | Status: AC
  Administered 2019-05-15: 5 mg via ORAL
  Filled 2019-05-15: qty 1

## 2019-05-15 MED ORDER — LABETALOL HCL 5 MG/ML IV SOLN
20.0000 mg | Freq: Once | INTRAVENOUS | Status: AC
  Administered 2019-05-15: 20 mg via INTRAVENOUS
  Filled 2019-05-15: qty 4

## 2019-05-15 MED ORDER — ONDANSETRON 8 MG PO TBDP: 8.0000 mg | ORAL_TABLET | Freq: Three times a day (TID) | ORAL | 0 refills | Status: DC | PRN

## 2019-05-15 MED ORDER — SODIUM CHLORIDE 0.9 % IV BOLUS
500.0000 mL | Freq: Once | INTRAVENOUS | Status: AC
  Administered 2019-05-15: 14:00:00 500 mL via INTRAVENOUS

## 2019-05-15 MED ORDER — LABETALOL HCL 5 MG/ML IV SOLN
10.0000 mg | Freq: Once | INTRAVENOUS | Status: AC
  Administered 2019-05-15: 10 mg via INTRAVENOUS
  Filled 2019-05-15: qty 4

## 2019-05-15 MED ORDER — AMLODIPINE BESYLATE 5 MG PO TABS: 5.0000 mg | ORAL_TABLET | Freq: Every day | ORAL | 0 refills | Status: DC

## 2019-05-15 NOTE — ED Notes (Signed)
Patient verbalizes understanding of discharge instructions. Opportunity for questioning and answers were provided. pt discharged from ED with husband.

## 2019-05-15 NOTE — ED Triage Notes (Signed)
Patient arrives via POV with c/o of loss of appetite x3 days. Pt also reports some vomiting, "spitting up", and nausea as well. Abdominal pain is ongoing with some loose bowel movements.
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
## 2019-05-15 NOTE — Discharge Instructions (Addendum)
Stop taking your cozaar.  Start taking the norvasc instead.  Follow up with your doctor to recheck your kidney function and to discuss your blood pressure medications

## 2019-05-23 ENCOUNTER — Encounter: Payer: Self-pay | Admitting: Internal Medicine

## 2019-05-23 NOTE — Patient Instructions (Signed)
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
## 2019-05-24 ENCOUNTER — Ambulatory Visit (INDEPENDENT_AMBULATORY_CARE_PROVIDER_SITE_OTHER): Payer: PPO | Admitting: Internal Medicine

## 2019-05-24 ENCOUNTER — Other Ambulatory Visit: Payer: Self-pay

## 2019-05-24 VITALS — BP 136/86 | HR 80 | Temp 97.1°F | Resp 16 | Ht 63.5 in | Wt 170.0 lb

## 2019-05-24 DIAGNOSIS — N1831 Chronic kidney disease, stage 3a: Secondary | ICD-10-CM

## 2019-05-24 DIAGNOSIS — E039 Hypothyroidism, unspecified: Secondary | ICD-10-CM

## 2019-05-24 DIAGNOSIS — F17200 Nicotine dependence, unspecified, uncomplicated: Secondary | ICD-10-CM

## 2019-05-24 DIAGNOSIS — M1000 Idiopathic gout, unspecified site: Secondary | ICD-10-CM

## 2019-05-24 DIAGNOSIS — Z0001 Encounter for general adult medical examination with abnormal findings: Secondary | ICD-10-CM

## 2019-05-24 DIAGNOSIS — Z136 Encounter for screening for cardiovascular disorders: Secondary | ICD-10-CM

## 2019-05-24 DIAGNOSIS — Z8249 Family history of ischemic heart disease and other diseases of the circulatory system: Secondary | ICD-10-CM

## 2019-05-24 DIAGNOSIS — Z0000 Encounter for general adult medical examination without abnormal findings: Secondary | ICD-10-CM

## 2019-05-24 DIAGNOSIS — E1122 Type 2 diabetes mellitus with diabetic chronic kidney disease: Secondary | ICD-10-CM

## 2019-05-24 DIAGNOSIS — Z91199 Patient's noncompliance with other medical treatment and regimen due to unspecified reason: Secondary | ICD-10-CM

## 2019-05-24 DIAGNOSIS — J438 Other emphysema: Secondary | ICD-10-CM

## 2019-05-24 DIAGNOSIS — F319 Bipolar disorder, unspecified: Secondary | ICD-10-CM

## 2019-05-24 DIAGNOSIS — R911 Solitary pulmonary nodule: Secondary | ICD-10-CM

## 2019-05-24 DIAGNOSIS — I10 Essential (primary) hypertension: Secondary | ICD-10-CM | POA: Diagnosis not present

## 2019-05-24 DIAGNOSIS — Z794 Long term (current) use of insulin: Secondary | ICD-10-CM

## 2019-05-24 DIAGNOSIS — G63 Polyneuropathy in diseases classified elsewhere: Secondary | ICD-10-CM

## 2019-05-24 DIAGNOSIS — E1121 Type 2 diabetes mellitus with diabetic nephropathy: Secondary | ICD-10-CM | POA: Diagnosis not present

## 2019-05-24 DIAGNOSIS — E785 Hyperlipidemia, unspecified: Secondary | ICD-10-CM

## 2019-05-24 DIAGNOSIS — E1169 Type 2 diabetes mellitus with other specified complication: Secondary | ICD-10-CM

## 2019-05-24 DIAGNOSIS — Z79899 Other long term (current) drug therapy: Secondary | ICD-10-CM

## 2019-05-24 DIAGNOSIS — Z1211 Encounter for screening for malignant neoplasm of colon: Secondary | ICD-10-CM

## 2019-05-24 DIAGNOSIS — Z9119 Patient's noncompliance with other medical treatment and regimen: Secondary | ICD-10-CM

## 2019-05-24 DIAGNOSIS — E559 Vitamin D deficiency, unspecified: Secondary | ICD-10-CM

## 2019-05-25 LAB — LIPID PANEL
Cholesterol: 155 mg/dL (ref <200)
HDL: 43 mg/dL — ABNORMAL LOW (ref >=50)
LDL Cholesterol (Calc): 73 mg/dL (calc)
Non-HDL Cholesterol (Calc): 112 mg/dL (calc) (ref <130)
Total CHOL/HDL Ratio: 3.6 (calc) (ref <5.0)
Triglycerides: 323 mg/dL — ABNORMAL HIGH (ref <150)

## 2019-05-25 LAB — CBC WITH DIFFERENTIAL/PLATELET
Absolute Monocytes: 525 cells/uL (ref 200–950)
Basophils Absolute: 19 cells/uL (ref 0–200)
Basophils Relative: 0.3 %
Eosinophils Absolute: 83 cells/uL (ref 15–500)
Eosinophils Relative: 1.3 %
HCT: 35 % (ref 35.0–45.0)
Hemoglobin: 11.4 g/dL — ABNORMAL LOW (ref 11.7–15.5)
Lymphs Abs: 1030 cells/uL (ref 850–3900)
MCH: 28.8 pg (ref 27.0–33.0)
MCHC: 32.6 g/dL (ref 32.0–36.0)
MCV: 88.4 fL (ref 80.0–100.0)
MPV: 10.2 fL (ref 7.5–12.5)
Monocytes Relative: 8.2 %
Neutro Abs: 4742 cells/uL (ref 1500–7800)
Neutrophils Relative %: 74.1 %
Platelets: 342 10*3/uL (ref 140–400)
RBC: 3.96 10*6/uL (ref 3.80–5.10)
RDW: 12.8 % (ref 11.0–15.0)
Total Lymphocyte: 16.1 %
WBC: 6.4 10*3/uL (ref 3.8–10.8)

## 2019-05-25 LAB — URINALYSIS, ROUTINE W REFLEX MICROSCOPIC
Bacteria, UA: NONE SEEN /HPF
Bilirubin Urine: NEGATIVE
Hyaline Cast: NONE SEEN /LPF
Ketones, ur: NEGATIVE
Nitrite: NEGATIVE
RBC / HPF: NONE SEEN /HPF (ref 0–2)
Specific Gravity, Urine: 1.011 (ref 1.001–1.03)
pH: 6.5 (ref 5.0–8.0)

## 2019-05-25 LAB — PTH, INTACT AND CALCIUM
Calcium: 9.3 mg/dL (ref 8.6–10.4)
PTH: 63 pg/mL (ref 14–64)

## 2019-05-25 LAB — MAGNESIUM: Magnesium: 1.6 mg/dL (ref 1.5–2.5)

## 2019-05-25 LAB — COMPLETE METABOLIC PANEL WITH GFR
AG Ratio: 1.2 (calc) (ref 1.0–2.5)
ALT: 21 U/L (ref 6–29)
AST: 22 U/L (ref 10–35)
Albumin: 3.1 g/dL — ABNORMAL LOW (ref 3.6–5.1)
Alkaline phosphatase (APISO): 47 U/L (ref 37–153)
BUN/Creatinine Ratio: 11 (calc) (ref 6–22)
BUN: 20 mg/dL (ref 7–25)
CO2: 28 mmol/L (ref 20–32)
Calcium: 9.3 mg/dL (ref 8.6–10.4)
Chloride: 100 mmol/L (ref 98–110)
Creat: 1.83 mg/dL — ABNORMAL HIGH (ref 0.60–0.93)
GFR, Est African American: 31 mL/min/{1.73_m2} — ABNORMAL LOW (ref >=60)
GFR, Est Non African American: 27 mL/min/{1.73_m2} — ABNORMAL LOW (ref >=60)
Globulin: 2.5 g/dL (calc) (ref 1.9–3.7)
Glucose, Bld: 224 mg/dL — ABNORMAL HIGH (ref 65–99)
Potassium: 3.8 mmol/L (ref 3.5–5.3)
Sodium: 138 mmol/L (ref 135–146)
Total Bilirubin: 0.2 mg/dL (ref 0.2–1.2)
Total Protein: 5.6 g/dL — ABNORMAL LOW (ref 6.1–8.1)

## 2019-05-25 LAB — URIC ACID: Uric Acid, Serum: 4.9 mg/dL (ref 2.5–7.0)

## 2019-05-25 LAB — HEMOGLOBIN A1C
Hgb A1c MFr Bld: 7.7 % of total Hgb — ABNORMAL HIGH (ref <5.7)
Mean Plasma Glucose: 174 (calc)
eAG (mmol/L): 9.7 (calc)

## 2019-05-25 LAB — MICROALBUMIN / CREATININE URINE RATIO
Creatinine, Urine: 47 mg/dL (ref 20–275)
Microalb Creat Ratio: 7153 mcg/mg creat — ABNORMAL HIGH (ref <30)
Microalb, Ur: 336.2 mg/dL

## 2019-05-25 LAB — TSH: TSH: 1.6 mIU/L (ref 0.40–4.50)

## 2019-05-25 LAB — VITAMIN D 25 HYDROXY (VIT D DEFICIENCY, FRACTURES): Vit D, 25-Hydroxy: 16 ng/mL — ABNORMAL LOW (ref 30–100)

## 2019-05-28 ENCOUNTER — Encounter: Payer: Self-pay | Admitting: Internal Medicine

## 2019-06-02 ENCOUNTER — Other Ambulatory Visit: Payer: Self-pay | Admitting: Internal Medicine

## 2019-06-02 ENCOUNTER — Ambulatory Visit
Admission: RE | Admit: 2019-06-02 | Discharge: 2019-06-02 | Disposition: A | Discharge status: Home / Self Care | Payer: PPO | Source: Ambulatory Visit | Attending: Internal Medicine | Admitting: Internal Medicine

## 2019-06-02 DIAGNOSIS — R911 Solitary pulmonary nodule: Secondary | ICD-10-CM

## 2019-06-02 DIAGNOSIS — J439 Emphysema, unspecified: Secondary | ICD-10-CM | POA: Diagnosis not present

## 2019-06-04 ENCOUNTER — Other Ambulatory Visit: Payer: Self-pay | Admitting: Internal Medicine

## 2019-06-04 DIAGNOSIS — R911 Solitary pulmonary nodule: Secondary | ICD-10-CM

## 2019-06-07 ENCOUNTER — Telehealth: Payer: Self-pay

## 2019-06-07 NOTE — Telephone Encounter (Signed)
ATC Patient.  LMTCB to schedule consult with Dr. Valeta Harms.

## 2019-06-07 NOTE — Telephone Encounter (Signed)
LMTCB.   Please schedule consult appt with BI. Thanks.

## 2019-06-08 NOTE — Telephone Encounter (Signed)
Consult appt has been made.  Nothing further needed at this time.

## 2019-06-09 ENCOUNTER — Encounter: Payer: Self-pay | Admitting: *Deleted

## 2019-06-09 NOTE — Progress Notes (Signed)
Oncology Nurse Navigator Documentation  Oncology Nurse Navigator Flowsheets 06/09/2019  Navigator Location [facility]  Navigator Encounter Type Other/I followed up on Ashley [name] schedule. She is set up to be seen with pulmonary team per Dr. Julien Nordmann.   Barriers/Navigation Needs No Barriers At This Time  Acuity Level 1-No Barriers  Time Spent with Patient 15

## 2019-06-13 ENCOUNTER — Other Ambulatory Visit: Payer: Self-pay

## 2019-06-13 ENCOUNTER — Other Ambulatory Visit: Payer: Self-pay | Admitting: Internal Medicine

## 2019-06-13 DIAGNOSIS — Z1211 Encounter for screening for malignant neoplasm of colon: Secondary | ICD-10-CM

## 2019-06-13 LAB — POC HEMOCCULT BLD/STL (HOME/3-CARD/SCREEN)
Card #2 Fecal Occult Blod, POC: NEGATIVE
Card #3 Fecal Occult Blood, POC: NEGATIVE
Fecal Occult Blood, POC: NEGATIVE

## 2019-06-14 ENCOUNTER — Other Ambulatory Visit: Payer: Self-pay

## 2019-06-14 ENCOUNTER — Encounter (HOSPITAL_COMMUNITY)
Admission: RE | Admit: 2019-06-14 | Discharge: 2019-06-14 | Disposition: A | Discharge status: Home / Self Care | Payer: PPO | Source: Ambulatory Visit | Attending: Internal Medicine | Admitting: Internal Medicine

## 2019-06-14 DIAGNOSIS — N200 Calculus of kidney: Secondary | ICD-10-CM | POA: Diagnosis not present

## 2019-06-14 DIAGNOSIS — I2510 Atherosclerotic heart disease of native coronary artery without angina pectoris: Secondary | ICD-10-CM | POA: Insufficient documentation

## 2019-06-14 DIAGNOSIS — R911 Solitary pulmonary nodule: Secondary | ICD-10-CM | POA: Diagnosis not present

## 2019-06-14 DIAGNOSIS — Z1212 Encounter for screening for malignant neoplasm of rectum: Secondary | ICD-10-CM | POA: Diagnosis not present

## 2019-06-14 DIAGNOSIS — I700 Atherosclerosis of aorta: Secondary | ICD-10-CM | POA: Diagnosis not present

## 2019-06-14 LAB — GLUCOSE, CAPILLARY: Glucose-Capillary: 150 mg/dL — ABNORMAL HIGH (ref 70–99)

## 2019-06-14 MED ORDER — FLUDEOXYGLUCOSE F - 18 (FDG) INJECTION
8.3000 | Freq: Once | INTRAVENOUS | Status: AC | PRN
  Administered 2019-06-14: 8.3 via INTRAVENOUS

## 2019-06-15 ENCOUNTER — Ambulatory Visit (HOSPITAL_COMMUNITY): Payer: PPO
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
## 2019-06-16 ENCOUNTER — Telehealth: Payer: Self-pay

## 2019-06-16 ENCOUNTER — Encounter: Payer: Self-pay | Admitting: Pulmonary Disease

## 2019-06-16 ENCOUNTER — Ambulatory Visit: Payer: PPO | Admitting: Pulmonary Disease

## 2019-06-16 ENCOUNTER — Other Ambulatory Visit: Payer: Self-pay

## 2019-06-16 VITALS — BP 142/80 | HR 81 | Temp 97.5°F | Ht 63.0 in | Wt 168.8 lb

## 2019-06-16 DIAGNOSIS — F17200 Nicotine dependence, unspecified, uncomplicated: Secondary | ICD-10-CM | POA: Diagnosis not present

## 2019-06-16 DIAGNOSIS — R942 Abnormal results of pulmonary function studies: Secondary | ICD-10-CM | POA: Diagnosis not present

## 2019-06-16 DIAGNOSIS — R911 Solitary pulmonary nodule: Secondary | ICD-10-CM

## 2019-06-16 NOTE — Patient Instructions (Addendum)
Thank you for visiting Dr. Valeta Harms at [facility]. Today we recommend the following:  Orders Placed This Encounter  Procedures  . Ambulatory referral to Pulmonology  . Pulmonary Function Test   We will schedule your procedure for next week.  Please expect a call with directions regarding preop evaluation and covid testing.   Return in about 4 weeks (around 07/14/2019).    Please do your part to reduce the spread of COVID-19.

## 2019-06-16 NOTE — Telephone Encounter (Signed)
Call made to [facility], spoke with Juliann Pulse at [facility], made aware we are needing this patient's CT converted to a Super D disc. She is going to look into this and give me a call back. Will route message to triage so message can be tracked. Will await a call back.   Ladies routing to triage, there is nothing to do. Just needed the message to be able to be tracked.

## 2019-06-16 NOTE — Telephone Encounter (Signed)
LMTCB with patient.   Bronch 11/6 at 730 am @ [facility]. Arrive by 615 am.   Covid Test is 11/3 at 8:15am for [place] location.   Will route to triage in case I am not available at the time they call back as well.

## 2019-06-16 NOTE — Telephone Encounter (Signed)
Call returned from [place] with [place], she is going to inter-office the super d disc.   Nothing further needed at this time.

## 2019-06-17 NOTE — Telephone Encounter (Signed)
Call made to patient, confirmed Bronch date, time, and location as well covid test date time and location as well. Nothing further needed at this time.

## 2019-06-21 ENCOUNTER — Telehealth: Payer: Self-pay | Admitting: Pulmonary Disease

## 2019-06-21 ENCOUNTER — Other Ambulatory Visit (HOSPITAL_COMMUNITY)
Admission: RE | Admit: 2019-06-21 | Discharge: 2019-06-21 | Disposition: A | Discharge status: Home / Self Care | Payer: PPO | Source: Ambulatory Visit | Attending: Pulmonary Disease | Admitting: Pulmonary Disease

## 2019-06-21 DIAGNOSIS — Z01812 Encounter for preprocedural laboratory examination: Secondary | ICD-10-CM | POA: Insufficient documentation

## 2019-06-21 DIAGNOSIS — Z20828 Contact with and (suspected) exposure to other viral communicable diseases: Secondary | ICD-10-CM | POA: Diagnosis not present

## 2019-06-21 NOTE — Telephone Encounter (Signed)
Spoke with Brother per DPR Let him know to call the hospital.  Patient having at [facility] gave him the main number and told him to let operator know and they could transfer him to where he needed to be or answer question about visitor for them.  Nothing further needed at this time

## 2019-06-21 NOTE — Telephone Encounter (Signed)
Pt brother Fritz Pickerel returning phone call you can reach him at [phone]

## 2019-06-21 NOTE — Telephone Encounter (Signed)
lmtcb for Ashley [name].

## 2019-06-22 LAB — NOVEL CORONAVIRUS, NAA (HOSP ORDER, SEND-OUT TO REF LAB; TAT 18-24 HRS): SARS-CoV-2, NAA: NOT DETECTED

## 2019-06-23 ENCOUNTER — Other Ambulatory Visit: Payer: Self-pay

## 2019-06-23 ENCOUNTER — Encounter (HOSPITAL_COMMUNITY): Payer: Self-pay | Admitting: *Deleted

## 2019-06-23 NOTE — Progress Notes (Addendum)
Ashley [name] denies chest pain or shortness of breath. Patient tested negative 11/3 and has been in quarantine with her  husband.  Ashley [name] is alert and oriented but frequently asks the same questions.   Ashley [name] has type II diabetes, patient's husband checks CBG and administers insulin.  I instructed Mr. Plyler to give patient 1/2 dose of Novolin N tonight.  I instructed Mr Teed to check CBG in am and if it is > 70 to give 1/2 dose of scheduled Novolin N, Mr Gose said that he does not give Insulin unless CBG is greater than 80, I said if > 80 give 1/2 of scheduled dose. I instructed patient and her husband to not take any pills for diabetes in am.

## 2019-06-23 NOTE — Anesthesia Preprocedure Evaluation (Addendum)
Anesthesia Evaluation  Patient identified by MRN, date of birth, ID band Patient awake    Reviewed: Allergy & Precautions, NPO status , Patient's Chart, lab work & pertinent test results  Airway Mallampati: II  TM Distance: >3 FB Neck ROM: Full    Dental no notable dental hx. (+) Teeth Intact   Pulmonary COPD, Current Smoker and Patient abstained from smoking.,    Pulmonary exam normal breath sounds clear to auscultation       Cardiovascular hypertension, Pt. on medications + Peripheral Vascular Disease  Normal cardiovascular exam Rhythm:Regular Rate:Normal     Neuro/Psych PSYCHIATRIC DISORDERS Bipolar Disorder Uses walker unsteady gait CVA    GI/Hepatic Neg liver ROS, GERD  ,  Endo/Other  diabetes, Type 2  Renal/GU      Musculoskeletal negative musculoskeletal ROS (+)   Abdominal   Peds  Hematology   Anesthesia Other Findings   Reproductive/Obstetrics                           Anesthesia Physical Anesthesia Plan  ASA: III  Anesthesia Plan: General   Post-op Pain Management:    Induction: Intravenous  PONV Risk Score and Plan: 3 and Treatment may vary due to age or medical condition, Ondansetron, Dexamethasone and Midazolam  Airway Management Planned: Oral ETT  Additional Equipment: None  Intra-op Plan:   Post-operative Plan: Extubation in OR  Informed Consent: I have reviewed the patients History and Physical, chart, labs and discussed the procedure including the risks, benefits and alternatives for the proposed anesthesia with the patient or authorized representative who has indicated his/her understanding and acceptance.     Dental advisory given  Plan Discussed with: CRNA  Anesthesia Plan Comments:       Anesthesia Quick Evaluation

## 2019-06-24 ENCOUNTER — Encounter (HOSPITAL_COMMUNITY)
Admission: RE | Disposition: A | Discharge status: Home / Self Care | Payer: Self-pay | Source: Home / Self Care | Attending: Pulmonary Disease

## 2019-06-24 ENCOUNTER — Other Ambulatory Visit: Payer: Self-pay

## 2019-06-24 ENCOUNTER — Ambulatory Visit (HOSPITAL_COMMUNITY)
Admission: RE | Admit: 2019-06-24 | Discharge: 2019-06-24 | Disposition: A | Discharge status: Home / Self Care | Payer: PPO | Attending: Pulmonary Disease | Admitting: Pulmonary Disease

## 2019-06-24 ENCOUNTER — Other Ambulatory Visit: Payer: Self-pay | Admitting: Internal Medicine

## 2019-06-24 ENCOUNTER — Ambulatory Visit (HOSPITAL_COMMUNITY): Payer: PPO | Admitting: Certified Registered Nurse Anesthetist

## 2019-06-24 ENCOUNTER — Ambulatory Visit (HOSPITAL_COMMUNITY): Payer: PPO

## 2019-06-24 ENCOUNTER — Encounter (HOSPITAL_COMMUNITY): Payer: Self-pay | Admitting: *Deleted

## 2019-06-24 ENCOUNTER — Telehealth: Payer: Self-pay | Admitting: Pulmonary Disease

## 2019-06-24 DIAGNOSIS — J449 Chronic obstructive pulmonary disease, unspecified: Secondary | ICD-10-CM | POA: Insufficient documentation

## 2019-06-24 DIAGNOSIS — J984 Other disorders of lung: Secondary | ICD-10-CM | POA: Diagnosis not present

## 2019-06-24 DIAGNOSIS — N1832 Chronic kidney disease, stage 3b: Secondary | ICD-10-CM | POA: Diagnosis not present

## 2019-06-24 DIAGNOSIS — R911 Solitary pulmonary nodule: Secondary | ICD-10-CM | POA: Insufficient documentation

## 2019-06-24 DIAGNOSIS — E7800 Pure hypercholesterolemia, unspecified: Secondary | ICD-10-CM | POA: Diagnosis not present

## 2019-06-24 DIAGNOSIS — F429 Obsessive-compulsive disorder, unspecified: Secondary | ICD-10-CM | POA: Diagnosis not present

## 2019-06-24 DIAGNOSIS — K219 Gastro-esophageal reflux disease without esophagitis: Secondary | ICD-10-CM | POA: Insufficient documentation

## 2019-06-24 DIAGNOSIS — F319 Bipolar disorder, unspecified: Secondary | ICD-10-CM | POA: Diagnosis not present

## 2019-06-24 DIAGNOSIS — Z419 Encounter for procedure for purposes other than remedying health state, unspecified: Secondary | ICD-10-CM

## 2019-06-24 DIAGNOSIS — E1122 Type 2 diabetes mellitus with diabetic chronic kidney disease: Secondary | ICD-10-CM | POA: Diagnosis not present

## 2019-06-24 DIAGNOSIS — I10 Essential (primary) hypertension: Secondary | ICD-10-CM

## 2019-06-24 DIAGNOSIS — Z882 Allergy status to sulfonamides status: Secondary | ICD-10-CM | POA: Diagnosis not present

## 2019-06-24 DIAGNOSIS — Z7982 Long term (current) use of aspirin: Secondary | ICD-10-CM | POA: Insufficient documentation

## 2019-06-24 DIAGNOSIS — M1990 Unspecified osteoarthritis, unspecified site: Secondary | ICD-10-CM | POA: Diagnosis not present

## 2019-06-24 DIAGNOSIS — Z79899 Other long term (current) drug therapy: Secondary | ICD-10-CM

## 2019-06-24 DIAGNOSIS — R918 Other nonspecific abnormal finding of lung field: Secondary | ICD-10-CM | POA: Diagnosis not present

## 2019-06-24 DIAGNOSIS — Z794 Long term (current) use of insulin: Secondary | ICD-10-CM | POA: Insufficient documentation

## 2019-06-24 DIAGNOSIS — R2681 Unsteadiness on feet: Secondary | ICD-10-CM | POA: Insufficient documentation

## 2019-06-24 DIAGNOSIS — F419 Anxiety disorder, unspecified: Secondary | ICD-10-CM | POA: Diagnosis not present

## 2019-06-24 DIAGNOSIS — F17210 Nicotine dependence, cigarettes, uncomplicated: Secondary | ICD-10-CM | POA: Insufficient documentation

## 2019-06-24 DIAGNOSIS — N183 Chronic kidney disease, stage 3 unspecified: Secondary | ICD-10-CM | POA: Diagnosis not present

## 2019-06-24 DIAGNOSIS — Z79890 Hormone replacement therapy (postmenopausal): Secondary | ICD-10-CM | POA: Diagnosis not present

## 2019-06-24 DIAGNOSIS — I129 Hypertensive chronic kidney disease with stage 1 through stage 4 chronic kidney disease, or unspecified chronic kidney disease: Secondary | ICD-10-CM | POA: Diagnosis not present

## 2019-06-24 DIAGNOSIS — E876 Hypokalemia: Secondary | ICD-10-CM

## 2019-06-24 DIAGNOSIS — E785 Hyperlipidemia, unspecified: Secondary | ICD-10-CM | POA: Insufficient documentation

## 2019-06-24 DIAGNOSIS — I69311 Memory deficit following cerebral infarction: Secondary | ICD-10-CM | POA: Insufficient documentation

## 2019-06-24 DIAGNOSIS — E1142 Type 2 diabetes mellitus with diabetic polyneuropathy: Secondary | ICD-10-CM | POA: Insufficient documentation

## 2019-06-24 DIAGNOSIS — E1151 Type 2 diabetes mellitus with diabetic peripheral angiopathy without gangrene: Secondary | ICD-10-CM | POA: Diagnosis not present

## 2019-06-24 DIAGNOSIS — N179 Acute kidney failure, unspecified: Secondary | ICD-10-CM

## 2019-06-24 DIAGNOSIS — Z9889 Other specified postprocedural states: Secondary | ICD-10-CM

## 2019-06-24 HISTORY — PX: VIDEO BRONCHOSCOPY WITH ENDOBRONCHIAL NAVIGATION: SHX6175

## 2019-06-24 HISTORY — PX: FUDUCIAL PLACEMENT: SHX5083

## 2019-06-24 HISTORY — DX: Dyspnea, unspecified: R06.00

## 2019-06-24 LAB — COMPREHENSIVE METABOLIC PANEL
ALT: 17 U/L (ref 0–44)
AST: 18 U/L (ref 15–41)
Albumin: 2.7 g/dL — ABNORMAL LOW (ref 3.5–5.0)
Alkaline Phosphatase: 42 U/L (ref 38–126)
Anion gap: 12 (ref 5–15)
BUN: 18 mg/dL (ref 8–23)
CO2: 26 mmol/L (ref 22–32)
Calcium: 9 mg/dL (ref 8.9–10.3)
Chloride: 101 mmol/L (ref 98–111)
Creatinine, Ser: 2.43 mg/dL — ABNORMAL HIGH (ref 0.44–1.00)
GFR calc Af Amer: 22 mL/min — ABNORMAL LOW (ref >60)
GFR calc non Af Amer: 19 mL/min — ABNORMAL LOW (ref >60)
Glucose, Bld: 137 mg/dL — ABNORMAL HIGH (ref 70–99)
Potassium: 2.4 mmol/L — CL (ref 3.5–5.1)
Sodium: 139 mmol/L (ref 135–145)
Total Bilirubin: 0.4 mg/dL (ref 0.3–1.2)
Total Protein: 5.7 g/dL — ABNORMAL LOW (ref 6.5–8.1)

## 2019-06-24 LAB — CBC
HCT: 33.8 % — ABNORMAL LOW (ref 36.0–46.0)
Hemoglobin: 11.1 g/dL — ABNORMAL LOW (ref 12.0–15.0)
MCH: 28.5 pg (ref 26.0–34.0)
MCHC: 32.8 g/dL (ref 30.0–36.0)
MCV: 86.9 fL (ref 80.0–100.0)
Platelets: 360 10*3/uL (ref 150–400)
RBC: 3.89 MIL/uL (ref 3.87–5.11)
RDW: 13.8 % (ref 11.5–15.5)
WBC: 8 10*3/uL (ref 4.0–10.5)
nRBC: 0 % (ref 0.0–0.2)

## 2019-06-24 LAB — PROTIME-INR
INR: 1 (ref 0.8–1.2)
Prothrombin Time: 12.7 seconds (ref 11.4–15.2)

## 2019-06-24 LAB — GLUCOSE, CAPILLARY
Glucose-Capillary: 129 mg/dL — ABNORMAL HIGH (ref 70–99)
Glucose-Capillary: 143 mg/dL — ABNORMAL HIGH (ref 70–99)

## 2019-06-24 LAB — APTT: aPTT: 28 seconds (ref 24–36)

## 2019-06-24 SURGERY — VIDEO BRONCHOSCOPY WITH ENDOBRONCHIAL NAVIGATION
Anesthesia: General

## 2019-06-24 MED ORDER — LABETALOL HCL 5 MG/ML IV SOLN
INTRAVENOUS | Status: DC | PRN
  Administered 2019-06-24 (×2): 5 mg via INTRAVENOUS

## 2019-06-24 MED ORDER — ROCURONIUM BROMIDE 10 MG/ML (PF) SYRINGE
PREFILLED_SYRINGE | INTRAVENOUS | Status: AC
  Filled 2019-06-24: qty 10

## 2019-06-24 MED ORDER — SUCCINYLCHOLINE CHLORIDE 200 MG/10ML IV SOSY
PREFILLED_SYRINGE | INTRAVENOUS | Status: AC
  Filled 2019-06-24: qty 10

## 2019-06-24 MED ORDER — LACTATED RINGERS IV SOLN
INTRAVENOUS | Status: DC | PRN
  Administered 2019-06-24 (×2): via INTRAVENOUS

## 2019-06-24 MED ORDER — 0.9 % SODIUM CHLORIDE (POUR BTL) OPTIME
TOPICAL | Status: DC | PRN
  Administered 2019-06-24: 1000 mL

## 2019-06-24 MED ORDER — DEXAMETHASONE SODIUM PHOSPHATE 10 MG/ML IJ SOLN
INTRAMUSCULAR | Status: DC | PRN
  Administered 2019-06-24: 4 mg via INTRAVENOUS

## 2019-06-24 MED ORDER — ONDANSETRON HCL 4 MG/2ML IJ SOLN
INTRAMUSCULAR | Status: DC | PRN
  Administered 2019-06-24: 4 mg via INTRAVENOUS

## 2019-06-24 MED ORDER — SUGAMMADEX SODIUM 200 MG/2ML IV SOLN
INTRAVENOUS | Status: DC | PRN
  Administered 2019-06-24: 200 mg via INTRAVENOUS

## 2019-06-24 MED ORDER — PROPOFOL 10 MG/ML IV BOLUS
INTRAVENOUS | Status: AC
  Filled 2019-06-24: qty 20

## 2019-06-24 MED ORDER — LIDOCAINE 20MG/ML (2%) 15 ML SYRINGE OPTIME
INTRAMUSCULAR | Status: DC | PRN
  Administered 2019-06-24: 100 mg via INTRAVENOUS

## 2019-06-24 MED ORDER — LIDOCAINE 2% (20 MG/ML) 5 ML SYRINGE
INTRAMUSCULAR | Status: AC
  Filled 2019-06-24: qty 5

## 2019-06-24 MED ORDER — EPHEDRINE 5 MG/ML INJ
INTRAVENOUS | Status: AC
  Filled 2019-06-24: qty 10

## 2019-06-24 MED ORDER — MIDAZOLAM HCL 2 MG/2ML IJ SOLN
INTRAMUSCULAR | Status: AC
  Filled 2019-06-24: qty 2

## 2019-06-24 MED ORDER — MIDAZOLAM HCL 5 MG/5ML IJ SOLN
INTRAMUSCULAR | Status: DC | PRN
  Administered 2019-06-24: 2 mg via INTRAVENOUS

## 2019-06-24 MED ORDER — PROPOFOL 10 MG/ML IV BOLUS
INTRAVENOUS | Status: DC | PRN
  Administered 2019-06-24: 90 mg via INTRAVENOUS
  Administered 2019-06-24: 20 mg via INTRAVENOUS

## 2019-06-24 MED ORDER — DEXAMETHASONE SODIUM PHOSPHATE 10 MG/ML IJ SOLN
INTRAMUSCULAR | Status: AC
  Filled 2019-06-24: qty 1

## 2019-06-24 MED ORDER — PHENYLEPHRINE 40 MCG/ML (10ML) SYRINGE FOR IV PUSH (FOR BLOOD PRESSURE SUPPORT)
PREFILLED_SYRINGE | INTRAVENOUS | Status: AC
  Filled 2019-06-24: qty 10

## 2019-06-24 MED ORDER — FENTANYL CITRATE (PF) 250 MCG/5ML IJ SOLN
INTRAMUSCULAR | Status: AC
  Filled 2019-06-24: qty 5

## 2019-06-24 MED ORDER — ONDANSETRON HCL 4 MG/2ML IJ SOLN
INTRAMUSCULAR | Status: AC
  Filled 2019-06-24: qty 2

## 2019-06-24 MED ORDER — FENTANYL CITRATE (PF) 100 MCG/2ML IJ SOLN
INTRAMUSCULAR | Status: DC | PRN
  Administered 2019-06-24 (×2): 50 ug via INTRAVENOUS

## 2019-06-24 MED ORDER — PHENYLEPHRINE HCL-NACL 10-0.9 MG/250ML-% IV SOLN
INTRAVENOUS | Status: DC | PRN
  Administered 2019-06-24: 25 ug/min via INTRAVENOUS

## 2019-06-24 MED ORDER — LABETALOL HCL 5 MG/ML IV SOLN: INTRAVENOUS | Status: DC | PRN

## 2019-06-24 MED ORDER — SUCCINYLCHOLINE CHLORIDE 200 MG/10ML IV SOSY
PREFILLED_SYRINGE | INTRAVENOUS | Status: DC | PRN
  Administered 2019-06-24: 100 mg via INTRAVENOUS

## 2019-06-24 MED ORDER — ROCURONIUM BROMIDE 10 MG/ML (PF) SYRINGE
PREFILLED_SYRINGE | INTRAVENOUS | Status: DC | PRN
  Administered 2019-06-24: 50 mg via INTRAVENOUS

## 2019-06-24 MED ORDER — POTASSIUM CHLORIDE CRYS ER 10 MEQ PO TBCR: EXTENDED_RELEASE_TABLET | ORAL | 0 refills | Status: DC

## 2019-06-24 SURGICAL SUPPLY — 46 items
ADAPTER BRONCHOSCOPE OLYMP 190 (ADAPTER) ×2 IMPLANT
ADAPTER BRONCHOSCOPE OLYMPUS (ADAPTER) ×2 IMPLANT
ADAPTER VALVE BIOPSY EBUS (MISCELLANEOUS) IMPLANT
ADPR BSCP OLMPS EDG (ADAPTER) ×1
ADPTR VALVE BIOPSY EBUS (MISCELLANEOUS)
BRUSH BIOPSY BRONCH 10 SDTNB (MISCELLANEOUS) ×2 IMPLANT
BRUSH CYTOL CELLEBRITY 1.5X140 (MISCELLANEOUS) ×2 IMPLANT
BRUSH SUPERTRAX BIOPSY (INSTRUMENTS) ×2 IMPLANT
BRUSH SUPERTRAX NDL-TIP CYTO (INSTRUMENTS) ×2 IMPLANT
CANISTER SUCT 3000ML PPV (MISCELLANEOUS) ×2 IMPLANT
CHANNEL WORK EXTEND EDGE 180 (KITS) IMPLANT
CHANNEL WORK EXTEND EDGE 90 (KITS) IMPLANT
CONT SPEC 4OZ CLIKSEAL STRL BL (MISCELLANEOUS) ×4 IMPLANT
COVER BACK TABLE 60X90IN (DRAPES) ×2 IMPLANT
COVER WAND RF STERILE (DRAPES) ×2 IMPLANT
FILTER STRAW FLUID ASPIR (MISCELLANEOUS) IMPLANT
FORCEPS BIOP SUPERTRX PREMAR (INSTRUMENTS) ×2 IMPLANT
GAUZE SPONGE 4X4 12PLY STRL (GAUZE/BANDAGES/DRESSINGS) ×2 IMPLANT
GLOVE SURG SS PI 7.5 STRL IVOR (GLOVE) ×2 IMPLANT
GOWN STRL REUS W/ TWL LRG LVL3 (GOWN DISPOSABLE) ×2 IMPLANT
GOWN STRL REUS W/TWL LRG LVL3 (GOWN DISPOSABLE) ×4
KIT CLEAN ENDO COMPLIANCE (KITS) ×2 IMPLANT
KIT ILLUMISITE 90 PROCEDURE (KITS) ×2 IMPLANT
KIT LOCATABLE GUIDE (CANNULA) IMPLANT
KIT MARKER FIDUCIAL DELIVERY (KITS) ×2 IMPLANT
KIT PROCEDURE EDGE 180 (KITS) IMPLANT
KIT PROCEDURE EDGE 90 (KITS) IMPLANT
KIT TURNOVER KIT B (KITS) ×2 IMPLANT
MARKER FIDUCIAL SL NIT COIL (Implant Marker) ×6 IMPLANT
MARKER SKIN DUAL TIP RULER LAB (MISCELLANEOUS) ×2 IMPLANT
NEEDLE SUPERTRX PREMARK BIOPSY (NEEDLE) ×2 IMPLANT
NS IRRIG 1000ML POUR BTL (IV SOLUTION) ×2 IMPLANT
OIL SILICONE PENTAX (PARTS (SERVICE/REPAIRS)) ×2 IMPLANT
PAD ARMBOARD 7.5X6 YLW CONV (MISCELLANEOUS) IMPLANT
PATCHES PATIENT (LABEL) ×6 IMPLANT
SYR 20ML ECCENTRIC (SYRINGE) ×2 IMPLANT
SYR 20ML LL LF (SYRINGE) ×4 IMPLANT
SYR 50ML SLIP (SYRINGE) ×2 IMPLANT
TOWEL GREEN STERILE FF (TOWEL DISPOSABLE) ×2 IMPLANT
TRAP SPECIMEN MUCOUS 40CC (MISCELLANEOUS) IMPLANT
TUBE CONNECTING 20X1/4 (TUBING) ×2 IMPLANT
UNDERPAD 30X30 (UNDERPADS AND DIAPERS) ×2 IMPLANT
VALVE BIOPSY  SINGLE USE (MISCELLANEOUS) ×1
VALVE BIOPSY SINGLE USE (MISCELLANEOUS) ×1 IMPLANT
VALVE SUCTION BRONCHIO DISP (MISCELLANEOUS) ×2 IMPLANT
WATER STERILE IRR 1000ML POUR (IV SOLUTION) ×2 IMPLANT

## 2019-06-24 NOTE — Telephone Encounter (Signed)
Noted. Will route to BI as FYI.   Nothing further needed at this time.   Thanks

## 2019-06-24 NOTE — Op Note (Signed)
Video Bronchoscopy with Electromagnetic Navigation and Fiducial Placement Procedure Note  Date of Operation: 06/24/2019  Pre-op Diagnosis: Right lower lobe PET avid lung nodule  Post-op Diagnosis: Right lower lobe PET avid lung nodule  Surgeon: Garner Nash, DO   Assistants: None   Anesthesia: General endotracheal anesthesia  Operation: Flexible video fiberoptic bronchoscopy with electromagnetic navigation and biopsies.  Estimated Blood Loss: Minimal, <4RD   Complications: None   Indications and History: Ashley [name] is a 73 y.o. female with right lower lobe PET avid lung nodule, 16.5 x 15.5 x 8.0 mm.  The risks, benefits, complications, treatment options and expected outcomes were discussed with the patient.  The possibilities of pneumothorax, pneumonia, reaction to medication, pulmonary aspiration, perforation of a viscus, bleeding, failure to diagnose a condition and creating a complication requiring transfusion or operation were discussed with the patient who freely signed the consent.    Description of Procedure: The patient was seen in the Preoperative Area, was examined and was deemed appropriate to proceed.  The patient was taken to [facility] OR 10, identified as Ashley [name] and the procedure verified as Flexible Video Fiberoptic Bronchoscopy.  A Time Out was held and the above information confirmed.   Prior to the date of the procedure a high-resolution CT scan of the chest was performed. Utilizing [employer] a virtual tracheobronchial tree was generated to allow the creation of distinct navigation pathways to the patient's parenchymal abnormalities. After being taken to the operating room general anesthesia was initiated and the patient  was orally intubated. The video fiberoptic bronchoscope was introduced via the endotracheal tube and a general inspection was performed which showed normal right and left lung anatomy evidence of bronchial pitting some striations no  evidence of endobronchial disease scattered mucous within the distal bilateral lower lobes both suctioned and cleared with bronchoscope. The extendable working channel and locator guide were introduced into the bronchoscope.  Following airway inspection we used 2 separate brush specimens from the right mainstem anterior wall and posterior wall that were collected for precepta testing. The distinct navigation pathways prepared prior to this procedure were then utilized to navigate to within 1.5 cm of patient's lesion(s) identified on CT scan. The extendable working channel was secured into place and the locator guide was withdrawn.  We completed a fluoroscopy suite for local registration with a breath-hold at 25 APL.  These fluoroscopic images were used for image guided adjustment within the computer software.  Under fluoroscopic guidance transbronchial needle brushings, transbronchial needle aspiration biopsies, and transbronchial forceps biopsies, triple brush specimens were performed to be sent for cytology and pathology. A bronchioalveolar lavage was performed in the right lower lobe and sent for cytology and microbiology (bacterial, fungal, AFB smears and cultures).  At the end of the specimen collection we used the navigational catheter to go within 3 separate planes of the original target lesion and all within 3 cm of the lesion to place 3 separate gold fiducial markers under fluoroscopic guidance. At the end of the procedure a general airway inspection was performed and there was no evidence of active bleeding. The bronchoscope was removed.  The patient tolerated the procedure well. There was no significant blood loss and there were no obvious complications. A post-procedural chest x-ray is pending.  Samples: 1. Transbronchial needle brushings from right lower lobe 2. Transbronchial needle aspiration biopsies from right lower lobe 3. Transbronchial forceps biopsies from right lower lobe 4. Triple brush  specimens from right lower lobe 5. Bronchoalveolar  lavage from right lower lobe 6. Right mainstem brush specimens anterior wall posterior wall for precepta testing.  Plans:  The patient will be discharged from the PACU to home when recovered from anesthesia and after chest x-ray is reviewed. We will review the cytology, pathology and microbiology results with the patient when they become available. Outpatient followup will be with Garner Nash, DO.   Garner Nash, DO [facility] 06/24/2019 9:48 AM

## 2019-06-24 NOTE — Anesthesia Postprocedure Evaluation (Signed)
Anesthesia Post Note  Patient: Ashley [name]  Procedure(s) Performed: VIDEO BRONCHOSCOPY WITH ENDOBRONCHIAL NAVIGATION (N/A ) PLACEMENT OF FUDUCIAL MARKERS (N/A )     Patient location during evaluation: PACU Anesthesia Type: General Level of consciousness: awake and alert Pain management: pain level controlled Vital Signs Assessment: post-procedure vital signs reviewed and stable Respiratory status: spontaneous breathing, nonlabored ventilation, respiratory function stable and patient connected to nasal cannula oxygen Cardiovascular status: blood pressure returned to baseline and stable Postop Assessment: no apparent nausea or vomiting Anesthetic complications: no    Last Vitals:  Vitals:   06/24/19 1100 06/24/19 1115  BP: (!) 145/58 (!) 150/70  Pulse: 74 78  Resp: 19 17  Temp:  36.7 C  SpO2: (!) 86% 97%    Last Pain:  Vitals:   06/24/19 1115  TempSrc:   PainSc: 0-No pain                 Barnet Glasgow

## 2019-06-24 NOTE — Transfer of Care (Signed)
Immediate Anesthesia Transfer of Care Note  Patient: Ashley [name]  Procedure(s) Performed: VIDEO BRONCHOSCOPY WITH ENDOBRONCHIAL NAVIGATION (N/A ) PLACEMENT OF FUDUCIAL MARKERS (N/A )  Patient Location: PACU  Anesthesia Type:General  Level of Consciousness: awake and patient cooperative  Airway & Oxygen Therapy: Patient Spontanous Breathing and Patient connected to face mask oxygen  Post-op Assessment: Report given to RN, Post -op Vital signs reviewed and stable and Patient moving all extremities X 4  Post vital signs: Reviewed and stable  Last Vitals:  Vitals Value Taken Time  BP 166/68 06/24/19 0958  Temp    Pulse 76 06/24/19 1000  Resp 18 06/24/19 1000  SpO2 99 % 06/24/19 1000  Vitals shown include unvalidated device data.  Last Pain:  Vitals:   06/24/19 0649  TempSrc:   PainSc: 0-No pain      Patients Stated Pain Goal: 6 (00/34/91 7915)  Complications: No apparent anesthesia complications

## 2019-06-24 NOTE — Progress Notes (Signed)
Notified Dr. Valma Cava of potassium 2.4

## 2019-06-24 NOTE — Interval H&P Note (Signed)
History and Physical Interval Note:  06/24/2019 7:26 AM  Ashley [name]  has presented today for surgery, with the diagnosis of LUNG NODULE.  The various methods of treatment have been discussed with the patient and family. After consideration of risks, benefits and other options for treatment, the patient has consented to  Procedure(s): [place] (N/A) POSSIBLE PLACEMENT OF FUDUCIAL (N/A) as a surgical intervention.  The patient's history has been reviewed, patient examined, no change in status, stable for surgery.  I have reviewed the patient's chart and labs.  Questions were answered to the patient's satisfaction.    Patient seen in pre-op. Discussed risks, benefits and alternatives. No barriers to proceed. Discussed risks of bleeding and pneumothorax.   [place]

## 2019-06-24 NOTE — Anesthesia Procedure Notes (Signed)
Procedure Name: Intubation Date/Time: 06/24/2019 8:06 AM Performed by: Lowella Dell, CRNA Pre-anesthesia Checklist: Patient identified, Emergency Drugs available, Suction available and Patient being monitored Patient Re-evaluated:Patient Re-evaluated prior to induction Oxygen Delivery Method: Circle System Utilized Preoxygenation: Pre-oxygenation with 100% oxygen Induction Type: IV induction Ventilation: Mask ventilation without difficulty Laryngoscope Size: Miller and 2 Grade View: Grade II Tube type: Oral Tube size: 8.5 mm Number of attempts: 1 Airway Equipment and Method: Stylet and Oral airway Placement Confirmation: ETT inserted through vocal cords under direct vision,  positive ETCO2 and breath sounds checked- equal and bilateral Secured at: 22 cm Tube secured with: Tape Dental Injury: Teeth and Oropharynx as per pre-operative assessment  Comments: Airway by [place]

## 2019-06-24 NOTE — Discharge Instructions (Signed)
Flexible Bronchoscopy, Care After This sheet gives you information about how to care for yourself after your test. Your doctor may also give you more specific instructions. If you have problems or questions, contact your doctor. Follow these instructions at home: Eating and drinking  The day after the test, go back to your normal diet. Driving  Do not drive for 24 hours if you were given a medicine to help you relax (sedative).  Do not drive or use heavy machinery while taking prescription pain medicine. General instructions   Take over-the-counter and prescription medicines only as told by your doctor.  Return to your normal activities as told. Ask what activities are safe for you.  Do not use any products that have nicotine or tobacco in them. This includes cigarettes and e-cigarettes. If you need help quitting, ask your doctor.  Keep all follow-up visits as told by your doctor. This is important. It is very important if you had a tissue sample (biopsy) taken. Get help right away if:  You have shortness of breath that gets worse.  You get light-headed.  You feel like you are going to pass out (faint).  You have chest pain.  You cough up: ? More than a little blood. ? More blood than before. Summary  Do not eat or drink anything (not even water) for 2 hours after your test, or until your numbing medicine wears off.  Do not use cigarettes. Do not use e-cigarettes.  Get help right away if you have chest pain. This information is not intended to replace advice given to you by your health care provider. Make sure you discuss any questions you have with your health care provider. Document Released: 06/01/2009 Document Revised: 07/17/2017 Document Reviewed: 08/22/2016 Elsevier Patient Education  2020 [employer].

## 2019-06-24 NOTE — Telephone Encounter (Signed)
Thanks Garner Nash, DO [facility] 06/24/2019 3:41 PM

## 2019-06-26 LAB — CULTURE, RESPIRATORY W GRAM STAIN: Culture: NORMAL

## 2019-06-27 ENCOUNTER — Ambulatory Visit: Payer: PPO | Admitting: Radiation Oncology

## 2019-06-27 ENCOUNTER — Encounter (HOSPITAL_COMMUNITY): Payer: Self-pay | Admitting: Pulmonary Disease

## 2019-06-27 ENCOUNTER — Ambulatory Visit: Payer: PPO

## 2019-06-27 LAB — SURGICAL PATHOLOGY

## 2019-06-27 LAB — CYTOLOGY - NON PAP

## 2019-06-28 ENCOUNTER — Other Ambulatory Visit: Payer: Self-pay | Admitting: Internal Medicine

## 2019-06-28 NOTE — Progress Notes (Signed)
Thoracic Location of Tumor / Histology: right lower lobe PET avid lung nodule, 16.5 x 15.5 x 8.0 mm.   Patient presented with symptoms of: This is a 74 year old female that likely has underlying COPD.  Longstanding history of smoking.  No formal PFTs in the past.  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  Biopsies revealed: 06/24/19: FINAL MICROSCOPIC DIAGNOSIS:   A. LUNG, RIGHT LOWER LOBE NEEDLE BRUSHING:  - No malignant cells identified   B. LUNG, RIGHT LOWER LOBE NEEDLE ASPIRATION:  - No malignant cells identified  FINAL MICROSCOPIC DIAGNOSIS:   A. LUNG, RIGHT LOWER LOBE, BIOPSY:  - Benign lung tissue.   Tobacco/Marijuana/Snuff/ETOH use:  Tobacco Use  . Smoking status: Current Every Day Smoker    Packs/day: 2.00    Years: 27.00    Pack years: 54.00    Types: Cigarettes  . Smokeless tobacco: Never Used  . Tobacco comment: Information on smoking cessation offered, pt refused information at this time  Substance and Sexual Activity  . Alcohol use: No    Alcohol/week: 0.0 standard drinks  . Drug use: No     Past/Anticipated interventions by cardiothoracic surgery, if any: None at this time.  Past/Anticipated interventions by medical oncology, if any: None at this time.  Signs/Symptoms Denies weight loss denies fevers chills or hemoptysis Weight changes, if any:  Wt Readings from Last 3 Encounters:  06/24/19 168 lb 12.8 oz (76.6 kg)  06/16/19 168 lb 12.8 oz (76.6 kg)  05/24/19 170 lb (77.1 kg)       Respiratory complaints, if any: pt reports cough, clear sputum. Pt reports SOB with exertion.  Hemoptysis, if any: pt denies hemoptysis  Pain issues, if any: pt reports neuropathy pain in bilateral legs/feet. Pt reports shoulder pain, RIGHT, and is unable to state how long pain has been present. Pt is unable to accurately  rate pain on 0-10 pain scale.   SAFETY ISSUES:  Prior radiation? No  Pacemaker/ICD? No  Possible current pregnancy? No  Is the patient on methotrexate? No  Current Complaints / other details:  Pt presents today for initial consult with Dr. [name] for Radiation Oncology. Pt is accompanied by husband. Pt ruminating about constellation of symptoms and when symptoms began. Pt is unable to stay on topic with interview.   BP (!) 153/86 (BP Location: Left Arm, Patient Position: Sitting)   Pulse 79   Temp 98.9 F (37.2 C) (Temporal)   Resp 18   Ht 5\' 3"  (1.6 m)   SpO2 98%   BMI 29.90 kg/m   Wt Readings from Last 3 Encounters:  06/24/19 168 lb 12.8 oz (76.6 kg)  06/16/19 168 lb 12.8 oz (76.6 kg)  05/24/19 170 lb (77.1 kg)   Loma Sousa, RN BSN

## 2019-06-29 ENCOUNTER — Encounter: Payer: Self-pay | Admitting: Radiation Oncology

## 2019-06-29 ENCOUNTER — Ambulatory Visit: Payer: PPO

## 2019-06-29 ENCOUNTER — Ambulatory Visit
Admission: RE | Admit: 2019-06-29 | Discharge: 2019-06-29 | Disposition: A | Discharge status: Home / Self Care | Payer: PPO | Source: Ambulatory Visit | Attending: Radiation Oncology | Admitting: Radiation Oncology

## 2019-06-29 ENCOUNTER — Telehealth: Payer: Self-pay | Admitting: *Deleted

## 2019-06-29 ENCOUNTER — Other Ambulatory Visit: Payer: Self-pay

## 2019-06-29 VITALS — BP 153/86 | HR 79 | Temp 98.9°F | Resp 18 | Ht 63.0 in

## 2019-06-29 DIAGNOSIS — R911 Solitary pulmonary nodule: Secondary | ICD-10-CM

## 2019-06-29 DIAGNOSIS — J95811 Postprocedural pneumothorax: Secondary | ICD-10-CM | POA: Diagnosis not present

## 2019-06-29 DIAGNOSIS — F17210 Nicotine dependence, cigarettes, uncomplicated: Secondary | ICD-10-CM | POA: Diagnosis not present

## 2019-06-29 DIAGNOSIS — J939 Pneumothorax, unspecified: Secondary | ICD-10-CM | POA: Diagnosis not present

## 2019-06-29 NOTE — Telephone Encounter (Signed)
Returned patient's phone call, spoke with patient 

## 2019-06-29 NOTE — Patient Instructions (Signed)
Coronavirus (COVID-19) Are you at risk?  Are you at risk for the Coronavirus (COVID-19)?  To be considered HIGH RISK for Coronavirus (COVID-19), you have to meet the following criteria:  . Traveled to [place], [place], [place], [place] or [place]; or in the [place] to [place], [place], [place], or [place]; and have fever, cough, and shortness of breath within the last 2 weeks of travel OR . Been in close contact with a person diagnosed with COVID-19 within the last 2 weeks and have fever, cough, and shortness of breath . IF YOU DO NOT MEET THESE CRITERIA, YOU ARE CONSIDERED LOW RISK FOR COVID-19.  What to do if you are HIGH RISK for COVID-19?  . If you are having a medical emergency, call 911. . Seek medical care right away. Before you go to a doctor's office, urgent care or emergency department, call ahead and tell them about your recent travel, contact with someone diagnosed with COVID-19, and your symptoms. You should receive instructions from your physician's office regarding next steps of care.  . When you arrive at healthcare provider, tell the healthcare staff immediately you have returned from visiting [place], [place], [place], [place] or [place]; or traveled in the [place] to [place], [place], [place], or [place]; in the last two weeks or you have been in close contact with a person diagnosed with COVID-19 in the last 2 weeks.   . Tell the health care staff about your symptoms: fever, cough and shortness of breath. . After you have been seen by a medical provider, you will be either: o Tested for (COVID-19) and discharged home on quarantine except to seek medical care if symptoms worsen, and asked to  - Stay home and avoid contact with others until you get your results (4-5 days)  - Avoid travel on public transportation if possible (such as bus, train, or airplane) or o Sent to the Emergency Department by EMS for evaluation, COVID-19 testing, and possible  admission depending on your condition and test results.  What to do if you are LOW RISK for COVID-19?  Reduce your risk of any infection by using the same precautions used for avoiding the common cold or flu:  . Wash your hands often with soap and warm water for at least 20 seconds.  If soap and water are not readily available, use an alcohol-based hand sanitizer with at least 60% alcohol.  . If coughing or sneezing, cover your mouth and nose by coughing or sneezing into the elbow areas of your shirt or coat, into a tissue or into your sleeve (not your hands). . Avoid shaking hands with others and consider head nods or verbal greetings only. . Avoid touching your eyes, nose, or mouth with unwashed hands.  . Avoid close contact with people who are sick. . Avoid places or events with large numbers of people in one location, like concerts or sporting events. . Carefully consider travel plans you have or are making. . If you are planning any travel outside or inside the [place], visit the CDC's Travelers' Health webpage for the latest health notices. . If you have some symptoms but not all symptoms, continue to monitor at home and seek medical attention if your symptoms worsen. . If you are having a medical emergency, call 911.   ADDITIONAL HEALTHCARE OPTIONS FOR PATIENTS  [facility] / e-Visit: [email or web address]         [facility]: [phone]     Urgent Care: [phone]                   [facility]: [phone]   

## 2019-06-29 NOTE — Progress Notes (Signed)
Radiation Oncology         [phone] ________________________________  Initial Inpatient Consultation  Name: Ashley [name] MRN: [id number]  Date: 06/29/2019  DOB: [birth year]  CC:Unk Pinto, MD  Unk Pinto, MD   REFERRING PHYSICIAN: Unk Pinto, MD  DIAGNOSIS: The encounter diagnosis was Right lower lobe pulmonary nodule.  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 female who is accompanied by husband. The patient presented to the ED on 05/15/2019 with complaints of nausea and vomiting. CT of abdomen/pelvis showed indeterminate 13.5 mm right lower lobe pulmonary nodule.  CT of chest on 06/02/2019 showed 16.5 x 15.5 x 8.0 mm right lower lobe pulmonary lesion, quite worrisome for primary lung neoplasm. It also showed two small subpleural nodules that are likely benign but will require surveillance. No mediastinal or hilar lymphadenopathy.  PET scan on 06/14/2019 showed hypermetabolic anterior right lower lobe pulmonary nodule, consistent with primary bronchogenic carcinoma. No thoracic nodal or extrathoracic metastasis identified.   She had an appointment with Dr. Valeta Harms, pulmonology, on 06/16/2019. They discussed resection, which the patient declined due to her recent decline in functional status. Patient was kindly referred here to consider definitive treatment with stereotactic body radiation therapy.  The patient underwent a bronchoschopy and biopsy on 06/24/2019. Biopsy of right lower lobe showed benign lung tissue. Needle brushing and aspiration of right lower lobe showed no malignant cells.  PREVIOUS RADIATION THERAPY: No  PAST MEDICAL HISTORY:  Past Medical History:  Diagnosis Date   Anxiety disorder    Arthritis LOWER BACK   Asthma    Bipolar 1 disorder ([place])    Carpal tunnel syndrome    Chronic kidney disease    stage 3     Depression    Diabetes mellitus    type 2   Dyslipidemia    Dyspnea    with much activity   Gait disorder    GERD (gastroesophageal reflux disease)    Gout LEFT FOOT-  STABLE   H/O hiatal hernia    History of CVA (cerebrovascular accident) FOUND PER MRI 1994--  RESIDUAL MEMORY IMPAIRED   Hypercholesteremia    Hypertension    Memory difficulty 08/14/2016   Neuropathy of lower extremity    OCD (obsessive compulsive disorder)    Peripheral neuropathy    SOB (shortness of breath) on exertion    Stroke ([place])    2000 memory loss    SUI (stress urinary incontinence, female)    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   Vulvar lesion     PAST SURGICAL HISTORY: Past Surgical History:  Procedure Laterality Date   BLADDER SUSPENSION  1996   COLONOSCOPY WITH PROPOFOL N/A 10/01/2017   Procedure: COLONOSCOPY WITH PROPOFOL;  Surgeon: [name], MD;  Location: [facility];  Service: Endoscopy;  Laterality: N/A;   FUDUCIAL PLACEMENT N/A 06/24/2019   Procedure: PLACEMENT OF FUDUCIAL MARKERS;  Surgeon: Garner Nash, DO;  Location: [facility];  Service: Thoracic;  Laterality: N/A;   [place]   VIDEO BRONCHOSCOPY WITH ENDOBRONCHIAL NAVIGATION N/A 06/24/2019   Procedure: VIDEO BRONCHOSCOPY WITH ENDOBRONCHIAL NAVIGATION;  Surgeon: Garner Nash, DO;  Location: [facility];  Service: Thoracic;  Laterality: N/A;   VULVAR LESION REMOVAL  06/22/2012   Procedure: VULVAR LESION;  Surgeon: Selinda Orion, MD;  Location: [facility];  Service: Gynecology;  Laterality: N/A;  WIDE EXCISION VULVAR LESION    VULVECTOMY PARTIAL  DEC 1999    FAMILY HISTORY:  Family History  Problem Relation Age of Onset   Stroke Father    Heart failure Father    Heart disease Father    Hyperlipidemia Father    Hypertension Father    Hypertension Brother    Hyperlipidemia Mother    Stroke Mother    Colon  cancer Neg Hx     SOCIAL HISTORY:  Social History   Tobacco Use   Smoking status: Current Every Day Smoker    Packs/day: 2.00    Years: 27.00    Pack years: 54.00    Types: Cigarettes   Smokeless tobacco: Never Used   Tobacco comment: Information on smoking cessation offered, pt refused information at this time  Substance Use Topics   Alcohol use: No    Alcohol/week: 0.0 standard drinks   Drug use: No    ALLERGIES:  Allergies  Allergen Reactions   Sulfa Antibiotics Other (See Comments)    Pt states she had "extreme pain"   Sulfacetamide Sodium Other (See Comments)    Pain    MEDICATIONS:  Current Outpatient Medications  Medication Sig Dispense Refill   acetaminophen (TYLENOL) 500 MG tablet Take 1,000 mg by mouth every 6 (six) hours as needed for mild pain or moderate pain.     amLODipine (NORVASC) 5 MG tablet Take 1 tablet Daily for BP & Heart (Patient taking differently: Take 5 mg by mouth daily. ) 90 tablet 1   aspirin EC 81 MG tablet Take 81 mg by mouth daily.     atorvastatin (LIPITOR) 40 MG tablet TAKE 1 TABLET DAILY BY MOUTH. (Patient taking differently: Take 40 mg by mouth daily. ) 90 tablet 1   Cholecalciferol (VITAMIN D3) 125 MCG/0.5ML LIQD Take 10,000 Units by mouth daily.     Cinnamon 500 MG TABS Take 500 mg by mouth daily.      clonazePAM (KLONOPIN) 1 MG tablet Take 1 mg by mouth at bedtime.      famotidine (PEPCID) 20 MG tablet Take 1 tablet 2 x /day with meals for Acid Indigestion (Patient taking differently: Take 20 mg by mouth 2 (two) times daily. with meals for Acid Indigestion) 180 tablet 3   fenofibrate (TRICOR) 145 MG tablet Take 1 tablet Daily for Triglycerides (Blood Fats) (Patient taking differently: Take 145 mg by mouth daily. for Triglycerides (Blood Fats)) 90 tablet 1   ferrous sulfate 325 (65 FE) MG EC tablet Two times daily with Vitamin C (Patient taking differently: Take 325 mg by mouth daily. ) 180 tablet 1   FLUoxetine  (PROZAC) 40 MG capsule TAKE ONE CAPSULE BY MOUTH EVERY DAY (Patient taking differently: Take 40 mg by mouth daily. ) 90 capsule 3   gabapentin (NEURONTIN) 600 MG tablet Take 1/2- 1 tablet 2 x /day at Breakfast & Bedtime for Diabetic Neuropathy pains (Patient taking differently: Take 1,200 mg by mouth 2 (two) times daily. Breakfast & Bedtime for Diabetic Neuropathy pains) 180 tablet 3   glipiZIDE (GLUCOTROL) 5 MG tablet Take 1 tablet 3 x  /day with Meals for Diabetes (Patient taking differently: Take 5 mg by mouth 3 (three) times daily with meals. ) 270 tablet 1   glucose blood (ONE TOUCH ULTRA TEST) test strip TEST BLOOD SUGAR 3 TIMES A DAY (DX E11.9) 300 each 1   hydrochlorothiazide (MICROZIDE) 12.5 MG capsule Take 12.5  mg by mouth daily.     ipratropium (ATROVENT) 0.06 % nasal spray Use 1 to 2 sprays each nostril 2 to 3 x / day as needed (Patient taking differently: Place 2 sprays into both nostrils daily as needed for rhinitis. ) 15 mL 11   levothyroxine (SYNTHROID) 100 MCG tablet Take 1 tablet (100 mcg total) by mouth daily. 90 tablet 1   Magnesium 400 MG TABS Take 400 mg by mouth 3 (three) times daily with meals.      metFORMIN (GLUCOPHAGE-XR) 500 MG 24 hr tablet Take 2 tablets 2 x /day with Meals for Diabetes 360 tablet 3   Multiple Vitamins-Minerals (MULTIVITAMIN WITH MINERALS) tablet Take 1 tablet by mouth daily.     NOVOLIN N 100 UNIT/ML injection INJECT 50 UNITS EVERY MORNING AND 25 UNITS EVERY EVENING (Patient taking differently: Inject 30-50 Units into the skin daily before breakfast. 130 or higher 30 units 200- 50 units) 30 mL 4   ondansetron (ZOFRAN ODT) 8 MG disintegrating tablet Take 1 tablet (8 mg total) by mouth every 8 (eight) hours as needed for nausea or vomiting. 12 tablet 0   potassium chloride (KLOR-CON M10) 10 MEQ tablet Take 1 tablet 3 x /day for Low Potassium 90 tablet 0   vitamin C (ASCORBIC ACID) 500 MG tablet Take 500 mg by mouth daily.     Zinc 50 MG CAPS  Take 50 mg by mouth daily.      No current facility-administered medications for this encounter.     REVIEW OF SYSTEMS:  A 10+ POINT REVIEW OF SYSTEMS WAS OBTAINED including neurology, dermatology, psychiatry, cardiac, respiratory, lymph, extremities, GI, GU, musculoskeletal, constitutional, reproductive, HEENT.  The patient reports chronic swelling in her right lower extremity.  She denies any pain within the chest area significant cough or hemoptysis.  She reports cutting back on her smoking but continues to smoke.   PHYSICAL EXAM:  height is 5\' 3"  (1.6 m). Her temporal temperature is 98.9 F (37.2 C). Her blood pressure is 153/86 (abnormal) and her pulse is 79. Her respiration is 18 and oxygen saturation is 98%.   General: Alert and oriented, in no acute distress HEENT: Head is normocephalic. Extraocular movements are intact. Oropharynx is clear. Neck: Neck is supple, no palpable cervical or supraclavicular lymphadenopathy. Heart: Regular in rate and rhythm with no murmurs, rubs, or gallops. Chest: Clear to auscultation bilaterally, with no rhonchi, wheezes, or rales. Abdomen: Soft, nontender, nondistended, with no rigidity or guarding. Extremities: No cyanosis or edema. Lymphatics: see Neck Exam Skin: No concerning lesions. Musculoskeletal: symmetric strength and muscle tone throughout. Neurologic: Cranial nerves II through XII are grossly intact. No obvious focalities. Speech is fluent. Coordination is intact. Psychiatric: Judgment and insight are intact. Affect is appropriate. Remains in wheelchair due to her overall performance status  ECOG = 2  0 - Asymptomatic (Fully active, able to carry on all predisease activities without restriction)  1 - Symptomatic but completely ambulatory (Restricted in physically strenuous activity but ambulatory and able to carry out work of a light or sedentary nature. For example, light housework, office work)  2 - Symptomatic, <50% in bed during the  day (Ambulatory and capable of all self care but unable to carry out any work activities. Up and about more than 50% of waking hours)  3 - Symptomatic, >50% in bed, but not bedbound (Capable of only limited self-care, confined to bed or chair 50% or more of waking hours)  4 - Bedbound (Completely disabled. Cannot  carry on any self-care. Totally confined to bed or chair)  5 - Death   Eustace Pen MM, Creech RH, Tormey DC, et al. [phone]). "Toxicity and response criteria of the [facility]". [place] Oncol. 5 (6): 649-55  LABORATORY DATA:  Lab Results  Component Value Date   WBC 8.0 06/24/2019   HGB 11.1 (L) 06/24/2019   HCT 33.8 (L) 06/24/2019   MCV 86.9 06/24/2019   PLT 360 06/24/2019   NEUTROABS 4,742 05/24/2019   Lab Results  Component Value Date   NA 139 06/24/2019   K 2.4 (LL) 06/24/2019   CL 101 06/24/2019   CO2 26 06/24/2019   GLUCOSE 137 (H) 06/24/2019   CREATININE 2.43 (H) 06/24/2019   CALCIUM 9.0 06/24/2019      RADIOGRAPHY: Ct Chest Wo Contrast  Result Date: 06/03/2019 CLINICAL DATA:  Followup pulmonary nodule seen on recent abdominal CT scan. EXAM: CT CHEST WITHOUT CONTRAST TECHNIQUE: Multidetector CT imaging of the chest was performed following the standard protocol without IV contrast. COMPARISON:  CT abdomen/pelvis 05/15/2019 FINDINGS: Cardiovascular: The heart is normal in size. Mild areas of pericardial thickening could reflect scar tissue. No pericardial effusion. There is mild tortuosity and moderate atherosclerotic calcifications involving the thoracic aorta but no aneurysm. Extensive three-vessel coronary artery calcifications are noted. Mediastinum/Nodes: No enlarged mediastinal or hilar lymph nodes. The esophagus is unremarkable. The thyroid gland is grossly normal. Small calcifications are noted in the right lobe. Lungs/Pleura: There are moderate emphysematous changes. No acute pulmonary findings. In the right lower lobe adjacent to the major  fissure there is a 16.5 x 15.5 x 8.0 mm lesion which has irregular and spiculated margins. It is also mildly tenting the major fissure. Findings are very worrisome for a primary lung neoplasm. PET-CT and referral to thoracic multidisciplinary clinic is recommended. 3.5 mm subpleural nodule in the left lower lobe on image number 63 could be a benign lymph node. Slightly nodular triangular density at the right lung base has the appearance of subpleural scarring change. No other worrisome pulmonary findings. Upper Abdomen: No significant findings. Moderate atherosclerotic calcifications involving the abdominal aorta and branch vessels. No worrisome hepatic or adrenal gland lesions. Musculoskeletal: No breast masses, supraclavicular or axillary lymphadenopathy. The bony structures are unremarkable. No acute bony findings or worrisome bone lesions. IMPRESSION: 1. 16.5 x 15.5 x 8.0 mm right lower lobe pulmonary lesion, quite worrisome for primary lung neoplasm. Recommend PET-CT and referral to thoracic multidisciplinary Clinic. 2. No mediastinal or hilar lymphadenopathy. 3. Emphysematous changes but no acute pulmonary findings. 4. 2 small subpleural nodules are likely benign but will require surveillance. 5. Advanced three-vessel coronary artery calcifications. 6. No significant upper abdominal findings. Aortic Atherosclerosis (ICD10-I70.0) and Emphysema (ICD10-J43.9). Electronically Signed   By: Marijo Sanes M.D.   On: 06/03/2019 07:57   Nm Pet Image Initial (pi) Skull Base To Thigh  Result Date: 06/14/2019 CLINICAL DATA:  Initial treatment strategy for right lower lobe pulmonary nodule. EXAM: NUCLEAR MEDICINE PET SKULL BASE TO THIGH TECHNIQUE: 8.3 mCi F-18 FDG was injected intravenously. Full-ring PET imaging was performed from the skull base to thigh after the radiotracer. CT data was obtained and used for attenuation correction and anatomic localization. Fasting blood glucose: 150 mg/dl COMPARISON:  Chest CT  06/02/2019.  Abdominopelvic CT 05/15/2019. FINDINGS: Mediastinal blood pool activity: SUV max 2.7 Liver activity: SUV max NA NECK: No areas of abnormal hypermetabolism. Incidental CT findings: No cervical adenopathy. Bilateral carotid atherosclerosis. CHEST: Hypermetabolism corresponding to the anterior right  lower lobe pulmonary nodule. This measures maximally 1.5 cm and a S.U.V. max of 2.8 on image 38/8.3 No thoracic nodal hypermetabolism. Incidental CT findings: Aortic and coronary artery atherosclerosis. Mild cardiomegaly. ABDOMEN/PELVIS: No abdominopelvic parenchymal or nodal hypermetabolism. Incidental CT findings: Punctate right renal collecting system calculi. Abdominal aortic atherosclerosis. Mild left adrenal nodularity, likely due to a subcentimeter adenoma. Hysterectomy. SKELETON: No abnormal marrow activity. Incidental CT findings: none IMPRESSION: 1. Hypermetabolic anterior right lower lobe pulmonary nodule, consistent with primary bronchogenic carcinoma. No thoracic nodal or extrathoracic metastasis identified. Presuming non-small-cell histology, T1bN0M0 or stage IA. 2. Coronary artery atherosclerosis. Aortic Atherosclerosis (ICD10-I70.0). 3. Right nephrolithiasis. Electronically Signed   By: Abigail Miyamoto M.D.   On: 06/14/2019 11:56   Dg Chest Port 1 View  Result Date: 06/24/2019 CLINICAL DATA:  Post bronchoscopy with biopsy. EXAM: PORTABLE CHEST 1 VIEW COMPARISON:  08/16/2011 and chest CT 06/02/2019 FINDINGS: Lungs are adequately inflated and demonstrate subtle hazy density over the right mid to lower lung which may be due to atelectasis/hemorrhage related to recent bronchoscopy with biopsies. There are a few surgical clips over the right lung base. No evidence of pneumothorax. Cardiomediastinal silhouette and remainder of the exam is unchanged. Left lung is clear. IMPRESSION: Subtle hazy density over the right mid to lower lung likely related patient's recent bronchoscopy/lung biopsy. No  pneumothorax. Electronically Signed   By: Marin Olp M.D.   On: 06/24/2019 10:17   Dg C-arm Bronchoscopy  Result Date: 06/24/2019 C-ARM BRONCHOSCOPY: Fluoroscopy was utilized by the requesting physician.  No radiographic interpretation.      IMPRESSION: Presumptive non-small cell right lung cancer (T1b, N0, M0) stage 1A presenting in the right lower lobe of the lung.  As above the patient underwent navigational bronchoscopy and biopsy but unfortunately tissue was nondiagnostic for malignancy.  Discussed options for management this time.  We discussed potential repeat navigational bronchoscopy and biopsy which the patient does not wish to consider.  We also talked about consideration for CT-guided biopsy but given the patient's overall lung status she does not wish to consider this procedure.  We also discussed consideration for treatment without tissue diagnosis realizing that imaging is very concerning for early stage primary lung cancer.  After careful consideration the patient would like to proceed with stereotactic body radiation therapy without tissue confirmation of malignancy.  Today, I talked to the patient and husband about the findings and work-up thus far.  We discussed the natural history of lung cancer and general treatment, highlighting the role of radiotherapy (SBRT) in the management.  We discussed the available radiation techniques, and focused on the details of logistics and delivery.  We reviewed the anticipated acute and late sequelae associated with radiation in this setting.  The patient was encouraged to ask questions that I answered to the best of my ability.  A patient consent form was discussed and signed.  We retained a copy for our records.  The patient would like to proceed with radiation and will be scheduled for CT simulation.  PLAN: The patient is scheduled for stereotactic body radiation therapy simulation tomorrow at 1 PM with treatments to begin approximately a week  later.  Anticipate three treatments directed at the PET positive right lower lobe pulmonary nodule.    ------------------------------------------------  Blair Promise, PhD, MD  This document serves as a record of services personally performed by Gery Pray, MD. It was created on his behalf by Clerance Lav, a trained medical scribe. The creation of this record is  based on the scribe's personal observations and the provider's statements to them. This document has been checked and approved by the attending provider.

## 2019-06-30 ENCOUNTER — Emergency Department
Admit: 2019-06-30 | Discharge: 2019-06-30 | Disposition: A | Discharge status: Home / Self Care | Payer: Self-pay | Attending: Radiation Oncology | Admitting: Radiation Oncology

## 2019-06-30 ENCOUNTER — Emergency Department (HOSPITAL_COMMUNITY): Payer: PPO

## 2019-06-30 ENCOUNTER — Other Ambulatory Visit: Payer: Self-pay

## 2019-06-30 ENCOUNTER — Other Ambulatory Visit: Payer: Self-pay | Admitting: Radiation Oncology

## 2019-06-30 ENCOUNTER — Ambulatory Visit: Payer: PPO

## 2019-06-30 ENCOUNTER — Inpatient Hospital Stay (HOSPITAL_COMMUNITY)
Admission: EM | Admit: 2019-06-30 | Discharge: 2019-07-03 | DRG: 200 | Disposition: A | Discharge status: Home / Self Care | Payer: PPO | Source: Ambulatory Visit | Attending: Family Medicine | Admitting: Family Medicine

## 2019-06-30 ENCOUNTER — Encounter (HOSPITAL_COMMUNITY): Payer: Self-pay | Admitting: Obstetrics and Gynecology

## 2019-06-30 ENCOUNTER — Ambulatory Visit
Admission: RE | Admit: 2019-06-30 | Discharge: 2019-06-30 | Disposition: A | Discharge status: Home / Self Care | Payer: PPO | Source: Ambulatory Visit | Attending: Radiation Oncology | Admitting: Radiation Oncology

## 2019-06-30 ENCOUNTER — Observation Stay (HOSPITAL_COMMUNITY): Payer: PPO

## 2019-06-30 DIAGNOSIS — C801 Malignant (primary) neoplasm, unspecified: Secondary | ICD-10-CM

## 2019-06-30 DIAGNOSIS — E669 Obesity, unspecified: Secondary | ICD-10-CM | POA: Diagnosis not present

## 2019-06-30 DIAGNOSIS — F419 Anxiety disorder, unspecified: Secondary | ICD-10-CM | POA: Diagnosis present

## 2019-06-30 DIAGNOSIS — R079 Chest pain, unspecified: Secondary | ICD-10-CM | POA: Diagnosis not present

## 2019-06-30 DIAGNOSIS — D631 Anemia in chronic kidney disease: Secondary | ICD-10-CM | POA: Diagnosis not present

## 2019-06-30 DIAGNOSIS — Z79890 Hormone replacement therapy (postmenopausal): Secondary | ICD-10-CM

## 2019-06-30 DIAGNOSIS — Z20828 Contact with and (suspected) exposure to other viral communicable diseases: Secondary | ICD-10-CM | POA: Diagnosis present

## 2019-06-30 DIAGNOSIS — E1165 Type 2 diabetes mellitus with hyperglycemia: Secondary | ICD-10-CM | POA: Diagnosis not present

## 2019-06-30 DIAGNOSIS — I10 Essential (primary) hypertension: Secondary | ICD-10-CM | POA: Diagnosis not present

## 2019-06-30 DIAGNOSIS — E1122 Type 2 diabetes mellitus with diabetic chronic kidney disease: Secondary | ICD-10-CM | POA: Diagnosis not present

## 2019-06-30 DIAGNOSIS — F319 Bipolar disorder, unspecified: Secondary | ICD-10-CM | POA: Diagnosis not present

## 2019-06-30 DIAGNOSIS — Z823 Family history of stroke: Secondary | ICD-10-CM

## 2019-06-30 DIAGNOSIS — Z8544 Personal history of malignant neoplasm of other female genital organs: Secondary | ICD-10-CM | POA: Diagnosis not present

## 2019-06-30 DIAGNOSIS — J930 Spontaneous tension pneumothorax: Secondary | ICD-10-CM | POA: Diagnosis not present

## 2019-06-30 DIAGNOSIS — F3130 Bipolar disorder, current episode depressed, mild or moderate severity, unspecified: Secondary | ICD-10-CM | POA: Diagnosis not present

## 2019-06-30 DIAGNOSIS — I129 Hypertensive chronic kidney disease with stage 1 through stage 4 chronic kidney disease, or unspecified chronic kidney disease: Secondary | ICD-10-CM | POA: Diagnosis present

## 2019-06-30 DIAGNOSIS — E1151 Type 2 diabetes mellitus with diabetic peripheral angiopathy without gangrene: Secondary | ICD-10-CM | POA: Diagnosis present

## 2019-06-30 DIAGNOSIS — N184 Chronic kidney disease, stage 4 (severe): Secondary | ICD-10-CM | POA: Diagnosis not present

## 2019-06-30 DIAGNOSIS — E039 Hypothyroidism, unspecified: Secondary | ICD-10-CM | POA: Diagnosis not present

## 2019-06-30 DIAGNOSIS — Z794 Long term (current) use of insulin: Secondary | ICD-10-CM | POA: Diagnosis not present

## 2019-06-30 DIAGNOSIS — E876 Hypokalemia: Secondary | ICD-10-CM | POA: Diagnosis not present

## 2019-06-30 DIAGNOSIS — R0602 Shortness of breath: Secondary | ICD-10-CM | POA: Diagnosis not present

## 2019-06-30 DIAGNOSIS — I739 Peripheral vascular disease, unspecified: Secondary | ICD-10-CM | POA: Diagnosis not present

## 2019-06-30 DIAGNOSIS — E785 Hyperlipidemia, unspecified: Secondary | ICD-10-CM | POA: Diagnosis present

## 2019-06-30 DIAGNOSIS — J90 Pleural effusion, not elsewhere classified: Secondary | ICD-10-CM | POA: Diagnosis not present

## 2019-06-30 DIAGNOSIS — Z8673 Personal history of transient ischemic attack (TIA), and cerebral infarction without residual deficits: Secondary | ICD-10-CM

## 2019-06-30 DIAGNOSIS — E038 Other specified hypothyroidism: Secondary | ICD-10-CM | POA: Diagnosis not present

## 2019-06-30 DIAGNOSIS — Z7982 Long term (current) use of aspirin: Secondary | ICD-10-CM

## 2019-06-30 DIAGNOSIS — J95811 Postprocedural pneumothorax: Secondary | ICD-10-CM | POA: Diagnosis not present

## 2019-06-30 DIAGNOSIS — J939 Pneumothorax, unspecified: Secondary | ICD-10-CM | POA: Diagnosis present

## 2019-06-30 DIAGNOSIS — E1141 Type 2 diabetes mellitus with diabetic mononeuropathy: Secondary | ICD-10-CM | POA: Diagnosis present

## 2019-06-30 DIAGNOSIS — Z6829 Body mass index (BMI) 29.0-29.9, adult: Secondary | ICD-10-CM | POA: Diagnosis not present

## 2019-06-30 DIAGNOSIS — Z8249 Family history of ischemic heart disease and other diseases of the circulatory system: Secondary | ICD-10-CM

## 2019-06-30 DIAGNOSIS — I160 Hypertensive urgency: Secondary | ICD-10-CM | POA: Diagnosis not present

## 2019-06-30 DIAGNOSIS — Z8349 Family history of other endocrine, nutritional and metabolic diseases: Secondary | ICD-10-CM

## 2019-06-30 DIAGNOSIS — F17210 Nicotine dependence, cigarettes, uncomplicated: Secondary | ICD-10-CM | POA: Diagnosis present

## 2019-06-30 DIAGNOSIS — IMO0002 Reserved for concepts with insufficient information to code with codable children: Secondary | ICD-10-CM | POA: Diagnosis present

## 2019-06-30 DIAGNOSIS — Y848 Other medical procedures as the cause of abnormal reaction of the patient, or of later complication, without mention of misadventure at the time of the procedure: Secondary | ICD-10-CM | POA: Diagnosis present

## 2019-06-30 LAB — I-STAT CHEM 8, ED
BUN: 28 mg/dL — ABNORMAL HIGH (ref 8–23)
Calcium, Ion: 1.26 mmol/L (ref 1.15–1.40)
Chloride: 102 mmol/L (ref 98–111)
Creatinine, Ser: 2.8 mg/dL — ABNORMAL HIGH (ref 0.44–1.00)
Glucose, Bld: 190 mg/dL — ABNORMAL HIGH (ref 70–99)
HCT: 32 % — ABNORMAL LOW (ref 36.0–46.0)
Hemoglobin: 10.9 g/dL — ABNORMAL LOW (ref 12.0–15.0)
Potassium: 2.9 mmol/L — ABNORMAL LOW (ref 3.5–5.1)
Sodium: 141 mmol/L (ref 135–145)
TCO2: 26 mmol/L (ref 22–32)

## 2019-06-30 LAB — CBC WITH DIFFERENTIAL/PLATELET
Abs Immature Granulocytes: 0.04 10*3/uL (ref 0.00–0.07)
Basophils Absolute: 0 10*3/uL (ref 0.0–0.1)
Basophils Relative: 1 %
Eosinophils Absolute: 0.1 10*3/uL (ref 0.0–0.5)
Eosinophils Relative: 2 %
HCT: 34.6 % — ABNORMAL LOW (ref 36.0–46.0)
Hemoglobin: 11.1 g/dL — ABNORMAL LOW (ref 12.0–15.0)
Immature Granulocytes: 1 %
Lymphocytes Relative: 19 %
Lymphs Abs: 1 10*3/uL (ref 0.7–4.0)
MCH: 28.8 pg (ref 26.0–34.0)
MCHC: 32.1 g/dL (ref 30.0–36.0)
MCV: 89.9 fL (ref 80.0–100.0)
Monocytes Absolute: 0.5 10*3/uL (ref 0.1–1.0)
Monocytes Relative: 8 %
Neutro Abs: 3.8 10*3/uL (ref 1.7–7.7)
Neutrophils Relative %: 69 %
Platelets: 329 10*3/uL (ref 150–400)
RBC: 3.85 MIL/uL — ABNORMAL LOW (ref 3.87–5.11)
RDW: 13.9 % (ref 11.5–15.5)
WBC: 5.5 10*3/uL (ref 4.0–10.5)
nRBC: 0 % (ref 0.0–0.2)

## 2019-06-30 LAB — MAGNESIUM: Magnesium: 2 mg/dL (ref 1.7–2.4)

## 2019-06-30 LAB — GLUCOSE, CAPILLARY: Glucose-Capillary: 158 mg/dL — ABNORMAL HIGH (ref 70–99)

## 2019-06-30 LAB — I-STAT BETA HCG BLOOD, ED (MC, WL, AP ONLY): I-stat hCG, quantitative: <5 m[IU]/mL (ref <5)

## 2019-06-30 MED ORDER — FENOFIBRATE 54 MG PO TABS
54.0000 mg | ORAL_TABLET | Freq: Every day | ORAL | Status: DC
  Administered 2019-07-01 – 2019-07-03 (×3): 54 mg via ORAL
  Filled 2019-06-30 (×3): qty 1

## 2019-06-30 MED ORDER — GABAPENTIN 400 MG PO CAPS
1200.0000 mg | ORAL_CAPSULE | Freq: Two times a day (BID) | ORAL | Status: DC
  Administered 2019-06-30 – 2019-07-01 (×2): 1200 mg via ORAL
  Filled 2019-06-30 (×2): qty 3

## 2019-06-30 MED ORDER — POTASSIUM CHLORIDE CRYS ER 20 MEQ PO TBCR
40.0000 meq | EXTENDED_RELEASE_TABLET | Freq: Two times a day (BID) | ORAL | Status: AC
  Administered 2019-06-30 – 2019-07-01 (×2): 40 meq via ORAL
  Filled 2019-06-30 (×2): qty 2

## 2019-06-30 MED ORDER — INSULIN ASPART 100 UNIT/ML ~~LOC~~ SOLN
0.0000 [IU] | Freq: Every day | SUBCUTANEOUS | Status: DC
  Administered 2019-07-01: 3 [IU] via SUBCUTANEOUS
  Administered 2019-07-02: 2 [IU] via SUBCUTANEOUS

## 2019-06-30 MED ORDER — ACETAMINOPHEN 325 MG PO TABS: 650.0000 mg | ORAL_TABLET | Freq: Four times a day (QID) | ORAL | Status: DC | PRN

## 2019-06-30 MED ORDER — LABETALOL HCL 5 MG/ML IV SOLN
20.0000 mg | Freq: Once | INTRAVENOUS | Status: AC
  Administered 2019-06-30: 20 mg via INTRAVENOUS
  Filled 2019-06-30: qty 4

## 2019-06-30 MED ORDER — POTASSIUM CHLORIDE CRYS ER 20 MEQ PO TBCR
40.0000 meq | EXTENDED_RELEASE_TABLET | Freq: Once | ORAL | Status: AC
  Administered 2019-06-30: 40 meq via ORAL
  Filled 2019-06-30: qty 2

## 2019-06-30 MED ORDER — ASPIRIN EC 81 MG PO TBEC
81.0000 mg | DELAYED_RELEASE_TABLET | Freq: Every day | ORAL | Status: DC
  Administered 2019-07-01 – 2019-07-03 (×3): 81 mg via ORAL
  Filled 2019-06-30 (×3): qty 1

## 2019-06-30 MED ORDER — ONDANSETRON HCL 4 MG PO TABS: 4.0000 mg | ORAL_TABLET | Freq: Four times a day (QID) | ORAL | Status: DC | PRN

## 2019-06-30 MED ORDER — LEVOTHYROXINE SODIUM 100 MCG PO TABS
100.0000 ug | ORAL_TABLET | Freq: Every day | ORAL | Status: DC
  Administered 2019-07-01 – 2019-07-03 (×3): 100 ug via ORAL
  Filled 2019-06-30 (×3): qty 1

## 2019-06-30 MED ORDER — ONDANSETRON HCL 4 MG/2ML IJ SOLN: 4.0000 mg | Freq: Four times a day (QID) | INTRAMUSCULAR | Status: DC | PRN

## 2019-06-30 MED ORDER — ATORVASTATIN CALCIUM 40 MG PO TABS
40.0000 mg | ORAL_TABLET | Freq: Every day | ORAL | Status: DC
  Administered 2019-07-01 – 2019-07-03 (×3): 40 mg via ORAL
  Filled 2019-06-30 (×3): qty 1

## 2019-06-30 MED ORDER — FLUOXETINE HCL 20 MG PO CAPS
40.0000 mg | ORAL_CAPSULE | Freq: Every day | ORAL | Status: DC
  Administered 2019-07-01 – 2019-07-03 (×3): 40 mg via ORAL
  Filled 2019-06-30 (×3): qty 2

## 2019-06-30 MED ORDER — CLONAZEPAM 1 MG PO TABS
1.0000 mg | ORAL_TABLET | Freq: Every day | ORAL | Status: DC
  Administered 2019-06-30 – 2019-07-02 (×3): 1 mg via ORAL
  Filled 2019-06-30 (×3): qty 1

## 2019-06-30 MED ORDER — INSULIN ASPART 100 UNIT/ML ~~LOC~~ SOLN
0.0000 [IU] | Freq: Three times a day (TID) | SUBCUTANEOUS | Status: DC
  Administered 2019-07-01: 3 [IU] via SUBCUTANEOUS
  Administered 2019-07-01: 7 [IU] via SUBCUTANEOUS
  Administered 2019-07-02: 3 [IU] via SUBCUTANEOUS
  Administered 2019-07-02: 7 [IU] via SUBCUTANEOUS
  Administered 2019-07-02: 4 [IU] via SUBCUTANEOUS
  Administered 2019-07-03: 11 [IU] via SUBCUTANEOUS
  Administered 2019-07-03: 4 [IU] via SUBCUTANEOUS
  Administered 2019-07-03: 7 [IU] via SUBCUTANEOUS

## 2019-06-30 MED ORDER — HYDRALAZINE HCL 20 MG/ML IJ SOLN: 5.0000 mg | Freq: Once | INTRAMUSCULAR | Status: DC

## 2019-06-30 MED ORDER — HYDRALAZINE HCL 25 MG PO TABS
25.0000 mg | ORAL_TABLET | Freq: Four times a day (QID) | ORAL | Status: DC | PRN
  Administered 2019-06-30 – 2019-07-03 (×2): 25 mg via ORAL
  Filled 2019-06-30: qty 3
  Filled 2019-06-30 (×2): qty 1

## 2019-06-30 MED ORDER — LORAZEPAM 1 MG PO TABS
1.0000 mg | ORAL_TABLET | Freq: Once | ORAL | Status: AC
  Administered 2019-06-30: 1 mg via ORAL
  Filled 2019-06-30: qty 1

## 2019-06-30 MED ORDER — ACETAMINOPHEN 650 MG RE SUPP: 650.0000 mg | Freq: Four times a day (QID) | RECTAL | Status: DC | PRN

## 2019-06-30 MED ORDER — LABETALOL HCL 5 MG/ML IV SOLN
10.0000 mg | Freq: Four times a day (QID) | INTRAVENOUS | Status: DC | PRN
  Administered 2019-07-01 – 2019-07-03 (×2): 10 mg via INTRAVENOUS
  Filled 2019-06-30 (×2): qty 4

## 2019-06-30 MED ORDER — AMLODIPINE BESYLATE 5 MG PO TABS: 5.0000 mg | ORAL_TABLET | Freq: Every day | ORAL | Status: DC

## 2019-06-30 MED ORDER — HYDROCHLOROTHIAZIDE 12.5 MG PO CAPS: 12.5000 mg | ORAL_CAPSULE | Freq: Every day | ORAL | Status: DC

## 2019-06-30 NOTE — ED Triage Notes (Signed)
Patient reports to the ED for assessment of a pneumothorax. Patient was in [place] and was it was seen on the CT scan for planning. Images will not cross from Rad-Onc to Epic.
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
## 2019-06-30 NOTE — Progress Notes (Signed)
Per Dr. [name], pt with pneumothorax appreciated on CT simulation scan. Dr. [name] spoke with pt and decision was made to present to [facility] ED. This RN attempted contact with charge RN x2. Per Dr. [name], this RN to expedite pt taken to ED. This RN transported pt to [facility] ED via [place]. Pt settled in ED bay on stretcher x1 assist. Report given to Laqueta Carina, RN. Husband at bedside with pt cane. Loma Sousa, RN BSN

## 2019-06-30 NOTE — ED Notes (Signed)
Attempted to give report to 4th floor nurse. Was told by the floor nurse to call back at [place], because the nurse was getting report.

## 2019-06-30 NOTE — ED Notes (Signed)
ED Provider at bedside. 

## 2019-06-30 NOTE — Progress Notes (Signed)
Formal consult in AM. Case discussed with ER physician and Dr. Rutha Bouchard PTX post bronch, delayed, mostly asymtpomatic Would obs overnight with 6L O2 to try to reduce size of PTX If still present in AM can do thora catheter to drain out air vs. pigtail catheter, will notify Kary Kos and [place]. Reason to drain the air is mostly to expedite radiation planning and treatment.  Erskine Emery MD  PCCM
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
## 2019-07-01 ENCOUNTER — Observation Stay (HOSPITAL_COMMUNITY): Payer: PPO

## 2019-07-01 DIAGNOSIS — E1141 Type 2 diabetes mellitus with diabetic mononeuropathy: Secondary | ICD-10-CM | POA: Diagnosis present

## 2019-07-01 DIAGNOSIS — D631 Anemia in chronic kidney disease: Secondary | ICD-10-CM | POA: Diagnosis present

## 2019-07-01 DIAGNOSIS — Z7982 Long term (current) use of aspirin: Secondary | ICD-10-CM | POA: Diagnosis not present

## 2019-07-01 DIAGNOSIS — Z8544 Personal history of malignant neoplasm of other female genital organs: Secondary | ICD-10-CM | POA: Diagnosis not present

## 2019-07-01 DIAGNOSIS — E039 Hypothyroidism, unspecified: Secondary | ICD-10-CM | POA: Diagnosis present

## 2019-07-01 DIAGNOSIS — Z8349 Family history of other endocrine, nutritional and metabolic diseases: Secondary | ICD-10-CM | POA: Diagnosis not present

## 2019-07-01 DIAGNOSIS — Z6829 Body mass index (BMI) 29.0-29.9, adult: Secondary | ICD-10-CM | POA: Diagnosis not present

## 2019-07-01 DIAGNOSIS — F3130 Bipolar disorder, current episode depressed, mild or moderate severity, unspecified: Secondary | ICD-10-CM | POA: Diagnosis present

## 2019-07-01 DIAGNOSIS — J95811 Postprocedural pneumothorax: Principal | ICD-10-CM

## 2019-07-01 DIAGNOSIS — F17210 Nicotine dependence, cigarettes, uncomplicated: Secondary | ICD-10-CM | POA: Diagnosis present

## 2019-07-01 DIAGNOSIS — E1122 Type 2 diabetes mellitus with diabetic chronic kidney disease: Secondary | ICD-10-CM | POA: Diagnosis present

## 2019-07-01 DIAGNOSIS — E1151 Type 2 diabetes mellitus with diabetic peripheral angiopathy without gangrene: Secondary | ICD-10-CM | POA: Diagnosis present

## 2019-07-01 DIAGNOSIS — E785 Hyperlipidemia, unspecified: Secondary | ICD-10-CM | POA: Diagnosis present

## 2019-07-01 DIAGNOSIS — N184 Chronic kidney disease, stage 4 (severe): Secondary | ICD-10-CM | POA: Diagnosis present

## 2019-07-01 DIAGNOSIS — E038 Other specified hypothyroidism: Secondary | ICD-10-CM | POA: Diagnosis not present

## 2019-07-01 DIAGNOSIS — E669 Obesity, unspecified: Secondary | ICD-10-CM | POA: Diagnosis present

## 2019-07-01 DIAGNOSIS — I160 Hypertensive urgency: Secondary | ICD-10-CM | POA: Diagnosis present

## 2019-07-01 DIAGNOSIS — Z823 Family history of stroke: Secondary | ICD-10-CM | POA: Diagnosis not present

## 2019-07-01 DIAGNOSIS — I129 Hypertensive chronic kidney disease with stage 1 through stage 4 chronic kidney disease, or unspecified chronic kidney disease: Secondary | ICD-10-CM | POA: Diagnosis present

## 2019-07-01 DIAGNOSIS — Z79890 Hormone replacement therapy (postmenopausal): Secondary | ICD-10-CM | POA: Diagnosis not present

## 2019-07-01 DIAGNOSIS — J939 Pneumothorax, unspecified: Secondary | ICD-10-CM | POA: Diagnosis not present

## 2019-07-01 DIAGNOSIS — Y848 Other medical procedures as the cause of abnormal reaction of the patient, or of later complication, without mention of misadventure at the time of the procedure: Secondary | ICD-10-CM | POA: Diagnosis present

## 2019-07-01 DIAGNOSIS — Z794 Long term (current) use of insulin: Secondary | ICD-10-CM | POA: Diagnosis not present

## 2019-07-01 DIAGNOSIS — Z8249 Family history of ischemic heart disease and other diseases of the circulatory system: Secondary | ICD-10-CM | POA: Diagnosis not present

## 2019-07-01 DIAGNOSIS — Z20828 Contact with and (suspected) exposure to other viral communicable diseases: Secondary | ICD-10-CM | POA: Diagnosis present

## 2019-07-01 DIAGNOSIS — E876 Hypokalemia: Secondary | ICD-10-CM | POA: Diagnosis present

## 2019-07-01 DIAGNOSIS — F419 Anxiety disorder, unspecified: Secondary | ICD-10-CM | POA: Diagnosis present

## 2019-07-01 LAB — BASIC METABOLIC PANEL
Anion gap: 8 (ref 5–15)
BUN: 33 mg/dL — ABNORMAL HIGH (ref 8–23)
CO2: 25 mmol/L (ref 22–32)
Calcium: 9 mg/dL (ref 8.9–10.3)
Chloride: 108 mmol/L (ref 98–111)
Creatinine, Ser: 2.78 mg/dL — ABNORMAL HIGH (ref 0.44–1.00)
GFR calc Af Amer: 19 mL/min — ABNORMAL LOW (ref >60)
GFR calc non Af Amer: 16 mL/min — ABNORMAL LOW (ref >60)
Glucose, Bld: 148 mg/dL — ABNORMAL HIGH (ref 70–99)
Potassium: 3.6 mmol/L (ref 3.5–5.1)
Sodium: 141 mmol/L (ref 135–145)

## 2019-07-01 LAB — GLUCOSE, CAPILLARY
Glucose-Capillary: 124 mg/dL — ABNORMAL HIGH (ref 70–99)
Glucose-Capillary: 105 mg/dL — ABNORMAL HIGH (ref 70–99)
Glucose-Capillary: 207 mg/dL — ABNORMAL HIGH (ref 70–99)
Glucose-Capillary: 286 mg/dL — ABNORMAL HIGH (ref 70–99)

## 2019-07-01 LAB — CBC
HCT: 30.2 % — ABNORMAL LOW (ref 36.0–46.0)
Hemoglobin: 9.6 g/dL — ABNORMAL LOW (ref 12.0–15.0)
MCH: 28.7 pg (ref 26.0–34.0)
MCHC: 31.8 g/dL (ref 30.0–36.0)
MCV: 90.1 fL (ref 80.0–100.0)
Platelets: 283 10*3/uL (ref 150–400)
RBC: 3.35 MIL/uL — ABNORMAL LOW (ref 3.87–5.11)
RDW: 14 % (ref 11.5–15.5)
WBC: 5.6 10*3/uL (ref 4.0–10.5)
nRBC: 0 % (ref 0.0–0.2)

## 2019-07-01 LAB — HEMOGLOBIN A1C
Hgb A1c MFr Bld: 7.5 % — ABNORMAL HIGH (ref 4.8–5.6)
Mean Plasma Glucose: 168.55 mg/dL

## 2019-07-01 LAB — SARS CORONAVIRUS 2 (TAT 6-24 HRS): SARS Coronavirus 2: NEGATIVE

## 2019-07-01 MED ORDER — GABAPENTIN 300 MG PO CAPS
300.0000 mg | ORAL_CAPSULE | Freq: Two times a day (BID) | ORAL | Status: DC
  Administered 2019-07-01 – 2019-07-03 (×4): 300 mg via ORAL
  Filled 2019-07-01 (×4): qty 1

## 2019-07-01 MED ORDER — HYDROCHLOROTHIAZIDE 12.5 MG PO CAPS
25.0000 mg | ORAL_CAPSULE | Freq: Every day | ORAL | Status: DC
  Administered 2019-07-01 – 2019-07-03 (×3): 25 mg via ORAL
  Filled 2019-07-01 (×3): qty 2

## 2019-07-01 MED ORDER — AMLODIPINE BESYLATE 10 MG PO TABS
10.0000 mg | ORAL_TABLET | Freq: Every day | ORAL | Status: DC
  Administered 2019-07-01 – 2019-07-03 (×3): 10 mg via ORAL
  Filled 2019-07-01 (×3): qty 1

## 2019-07-01 NOTE — Progress Notes (Signed)
MEWS score yellow due to BP: 214/96. Labetalol 10 mg IV given @ 0526. Yellow MEWS score protocol implemented. [facility] floor coverage Rufina Falco, NP and Jennye Moccasin, Rapid Response RN made aware). No new orders at this time. Will re-assess patient's BP in 1 hour and give Hydralazine if systolic number still > 159.

## 2019-07-01 NOTE — Care Management Obs Status (Signed)
[place] NOTIFICATION   Patient Details  Name: DAREN DOSWELL MRN: [id number] Date of Birth: 24-Feb-1946   Medicare Observation Status Notification Given:  Yes    MahabirJuliann Pulse, RN 07/01/2019, 2:57 PM

## 2019-07-01 NOTE — Progress Notes (Addendum)
PROGRESS NOTE    Shuronda Santino Marsala  WUG:[id number] DOB: [birth year] DOA: 06/30/2019 PCP: Unk Pinto, MD      Brief Narrative:  Mrs. Udell is a 73 y.o. F with hx T1b RLL lung cancer, hypothyroidism, depression/anxiety, HTN, DM, and CKD IV Baseline creatinine 1.8-2.4 who presents with incidental finding of pneumothorax.  Patient underwent bronchoscopic biopsy 1 week ago.  Post bronc chest x-ray showed no pneumothorax, and the patient's been asymptomatic without chest pain or dyspnea in the last week, but today she was at radiation oncology clinic, and her routine CT scan showed a small right apical pneumothorax.  In the ER, the patient was severely hypertensive, but her pulse ox was normal on room air and she was asymptomatic.  K2.9, creatinine 2.8 (baseline 1.8-2.4), WBC 5.5K.  Case was discussed with pulmonology, who recommended 6 L oxygen and observation overnight.         Assessment & Plan:  Pneumothorax, right apical Pulmonology were consulted recommended 6 L supplemental oxygen overnight observation.   -Pulmonology consult pending, appreciate expert insights -Defer repeat imaging to pulmonology  Addendum: Personally reviewed CXR, the pneumothorax appears slightly more prominent on this 2 view.  Discussed with pulmonology, they recommend: -Continue supplemental oxygen -Repeat chest x-ray tomorrow -We will defer decision re: chest tube for now         Hypertensive urgency Essential hypertension Patient presented with blood pressure greater than 240/110.  Without encephalopathy, chest pain, renal failure, pulmonary edema.  Pressure reduced by 25% overnight, will now start to augment blood pressure control. -Continue amlodipine, HCTZ, increased dose as above -Continue IV labetalol and p.o. hydralazine as needed for severe range pressures -Question whether HCTZ should be transitioned to furosemide given renal function -If no improvement by this afternoon, we  will add a third agent   Hypokalemia Magnesium normal.  K normalized with supplementation.  Diabetes Glucoses well controlled -Hold home glipizide and Metformin -Continue sliding scale correction insulin  -Continue home aspirin, atorvastatin  Diabetic neuropathy -Reduce gabapentin dose given advanced CKD to 300 BID  Hypothyroidism -Continue home levothyroxine  CKD stage IV Slightly elevated relative to baseline, do not think this is AKI.  Creatinine down to 2.7 today.  Baseline is around 2.4. 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  Medication management as above.  The patient was admitted with small right pneumothorax.  Pulmonology were consulted, and we will repeat imaging today.  If imaging is normal, likely home later this afternoon.  If the pneumothorax is still present, defer management to pulmonology, although this will likely require inpatient treatment.       DVT prophylaxis: SCDs Code Status: Full code Family Communication:     Consultants:   Pulmonology  Procedures:     Antimicrobials:       Subjective: She has had no headache, dizziness, confusion, chest pain, trouble breathing, orthopnea, swelling.  She has no cough or hemoptysis, no fever.  She is mostly just sleeping not well rested as a result of people being allowed outside of her ER last night.  Objective: Vitals:   06/30/19 1900 06/30/19 2020 07/01/19 0513 07/01/19 0647  BP: (!) 158/109 (!) 200/89 (!) 214/96 (!) 165/94  Pulse: 71 74 76 69  Resp: 19 16 18 14   Temp:  97.8 F (36.6 C) 99 F (37.2 C) 99.1 F (37.3 C)  TempSrc:  Oral Oral Oral  SpO2: 100% 100% 100% 100%  Height:  5\' 3"  (1.6 m)     No intake or output data in the  24 hours ending 07/01/19 0750 There were no vitals filed for this visit.  Examination: General appearance:  adult female, drowsy but awake and in no acute distress.   HEENT: Anicteric, conjunctiva pink, lids and lashes normal. No nasal deformity, discharge, epistaxis.  Lips moist, dentition normal, oropharynx moist, no oral lesions.   Skin: Warm and dry.   No suspicious rashes or lesions. Cardiac: RRR, nl S1-S2, no murmurs appreciated.  Capillary refill is brisk.  JVP not visible.  No LE edema.  Radial  pulses 2+ and symmetric. Respiratory: Normal respiratory rate and rhythm.  CTAB without rales or wheezes. Abdomen: Abdomen soft.  No TTP or guarding. No ascites, distension, hepatosplenomegaly.   MSK: No deformities or effusions. Neuro: Awake and and drowsy but alert to me.  EOMI, moves all extremities. Speech fluent.    Psych: Sensorium intact and responding to questions, attention normal. Affect blunted.  Judgment and insight appear normal.    Data Reviewed: I have personally reviewed following labs and imaging studies:  CBC: Recent Labs  Lab 06/30/19 1700 06/30/19 1715 07/01/19 0532  WBC 5.5  --  5.6  NEUTROABS 3.8  --   --   HGB 11.1* 10.9* 9.6*  HCT 34.6* 32.0* 30.2*  MCV 89.9  --  90.1  PLT 329  --  350   Basic Metabolic Panel: Recent Labs  Lab 06/30/19 1700 06/30/19 1715 07/01/19 0532  NA  --  141 141  K  --  2.9* 3.6  CL  --  102 108  CO2  --   --  25  GLUCOSE  --  190* 148*  BUN  --  28* 33*  CREATININE  --  2.80* 2.78*  CALCIUM  --   --  9.0  MG 2.0  --   --    GFR: Estimated Creatinine Clearance: 17.9 mL/min (A) (by C-G formula based on SCr of 2.78 mg/dL (H)). Liver Function Tests: No results for input(s): AST, ALT, ALKPHOS, BILITOT, PROT, ALBUMIN in the last 168 hours. No results for input(s): LIPASE, AMYLASE in the last 168 hours. No results for input(s): AMMONIA in the last 168 hours. Coagulation Profile: No results for input(s): INR, PROTIME in the  last 168 hours. Cardiac Enzymes: No results for input(s): CKTOTAL, CKMB, CKMBINDEX, TROPONINI in the last 168 hours. BNP (last 3 results) No results for input(s): PROBNP in the last 8760 hours. HbA1C: No results for input(s): HGBA1C in the last 72 hours. CBG: Recent Labs  Lab 06/24/19 1007 06/30/19 2112  GLUCAP 143* 158*   Lipid Profile: No results for input(s): CHOL, HDL, LDLCALC, TRIG, CHOLHDL, LDLDIRECT in the last 72 hours. Thyroid Function Tests: No results for input(s): TSH, T4TOTAL, FREET4, T3FREE, THYROIDAB in the last 72 hours. Anemia Panel: No results for input(s): VITAMINB12, FOLATE, FERRITIN, TIBC, IRON, RETICCTPCT in the last 72 hours. Urine analysis:    Component Value Date/Time   COLORURINE YELLOW 05/24/2019 1454   APPEARANCEUR CLEAR 05/24/2019 1454   LABSPEC 1.011 05/24/2019 1454   PHURINE 6.5 05/24/2019 1454   GLUCOSEU TRACE (A) 05/24/2019 1454   HGBUR TRACE (A) 05/24/2019 1454   BILIRUBINUR NEGATIVE 05/15/2019 1748   KETONESUR NEGATIVE 05/24/2019 1454   PROTEINUR 3+ (A) 05/24/2019 1454   UROBILINOGEN 0.2  10/26/2014 1610   NITRITE NEGATIVE 05/24/2019 1454   LEUKOCYTESUR TRACE (A) 05/24/2019 1454   Sepsis Labs: @LABRCNTIP (procalcitonin:4,lacticacidven:4)  ) Recent Results (from the past 240 hour(s))  Novel Coronavirus, NAA (Hosp order, Send-out to Ref Lab; TAT 18-24 hrs     Status: None   Collection Time: 06/21/19  8:10 AM   Specimen: Nasopharyngeal Swab; Respiratory  Result Value Ref Range Status   SARS-CoV-2, NAA NOT DETECTED NOT DETECTED Final    Comment: (NOTE) This nucleic acid amplification test was developed and its performance characteristics determined by [employer]. Nucleic acid amplification tests include PCR and TMA. This test has not been FDA cleared or approved. This test has been authorized by FDA under an Emergency Use Authorization (EUA). This test is only authorized for the duration of time the declaration that  circumstances exist justifying the authorization of the emergency use of in vitro diagnostic tests for detection of SARS-CoV-2 virus and/or diagnosis of COVID-19 infection under section 564(b)(1) of the Act, 21 U.S.C. 283MOQ-9(U) (1), unless the authorization is terminated or revoked sooner. When diagnostic testing is negative, the possibility of a false negative result should be considered in the context of a patient's recent exposures and the presence of clinical signs and symptoms consistent with COVID-19. An individual without symptoms of COVID- 19 and who is not shedding SARS-CoV-2 vi rus would expect to have a negative (not detected) result in this assay. Performed At: [facility] [address] [place], [place] [id number] Rush Farmer MD WS:[id number]    [place]  Final    Comment: Performed at [facility], [place] [address] [place], [place] [address]  Culture, respiratory     Status: None   Collection Time: 06/24/19  9:27 AM   Specimen: Bronchial Alveolar Lavage; Respiratory  Result Value Ref Range Status   Specimen Description BRONCHIAL ALVEOLAR LAVAGE  Final   Special Requests NONE  Final   Gram Stain   Final    RARE WBC PRESENT, PREDOMINANTLY PMN RARE GRAM POSITIVE COCCI IN PAIRS    Culture   Final    Consistent with normal respiratory flora. Performed at [facility], [place] [address], [place], [place] [address]    Report Status 06/26/2019 FINAL  Final  Acid Fast Smear (AFB)     Status: None   Collection Time: 06/24/19  9:27 AM   Specimen: Bronchial Alveolar Lavage; Respiratory  Result Value Ref Range Status   AFB Specimen Processing Concentration  Final   Acid Fast Smear Negative  Final    Comment: (NOTE) Performed At: [facility] [place], [place] [id number] Rush Farmer MD ZL:[id number]    Source (AFB) BRONCHIAL ALVEOLAR LAVAGE  Final    Comment: Performed at [facility],  [place] [address], [place], [place] [address]  Fungus Culture With Stain     Status: None (Preliminary result)   Collection Time: 06/24/19  9:27 AM  Result Value Ref Range Status   Fungus Stain Final report  Final    Comment: (NOTE) Performed At: [facility] [place], [place] [id number] Rush Farmer MD [id number]:[id number]    Fungus (Mycology) Culture PENDING  Incomplete   Fungal Source BRONCHIAL ALVEOLAR LAVAGE  Final    Comment: Performed at [facility], [place] [address] [place], [place] [address]  Fungus Culture Result     Status: None   Collection Time: 06/24/19  9:27 AM  Result Value Ref Range Status  Result 1 Comment  Final    Comment: (NOTE) KOH/Calcofluor preparation:  no fungus observed. Performed At: [facility] [place], [place] [id number] Rush Farmer MD [id number]   SARS CORONAVIRUS 2 (TAT 6-24 HRS) Nasopharyngeal Nasopharyngeal Swab     Status: None   Collection Time: 06/30/19  5:16 PM   Specimen: Nasopharyngeal Swab  Result Value Ref Range Status   SARS Coronavirus 2 NEGATIVE NEGATIVE Final    Comment: (NOTE) SARS-CoV-2 target nucleic acids are NOT DETECTED. The SARS-CoV-2 RNA is generally detectable in upper and lower respiratory specimens during the acute phase of infection. Negative results do not preclude SARS-CoV-2 infection, do not rule out co-infections with other pathogens, and should not be used as the sole basis for treatment or other patient management decisions. Negative results must be combined with clinical observations, patient history, and epidemiological information. The expected result is Negative. Fact Sheet for Patients: SugarRoll.be Fact Sheet for Healthcare Providers: https://www.woods-mathews.com/ This test is not yet approved or cleared by the [place] FDA and  has been authorized for detection and/or diagnosis of SARS-CoV-2 by FDA under an  Emergency Use Authorization (EUA). This EUA will remain  in effect (meaning this test can be used) for the duration of the COVID-19 declaration under Section 56 4(b)(1) of the Act, 21 U.S.C. section 360bbb-3(b)(1), unless the authorization is terminated or revoked sooner. Performed at [facility], [place] [address], [place], [place] [address]          Radiology Studies: Dg Chest 1 View  Result Date: 06/30/2019 CLINICAL DATA:  Shortness of breath. EXAM: CHEST  1 VIEW COMPARISON:  June 30, 2019 at 4:22 p.m. FINDINGS: There is a persistent small right-sided pneumothorax which has decreased in size from prior study. There are multiple fiducial markers overlying the right lower lung zone. There is a small right-sided pleural effusion. The left lung field is clear without evidence for pneumothorax. The heart size is stable. Aortic calcifications are noted. IMPRESSION: 1. Small right-sided pneumothorax, decreased from recent prior chest x-ray. 2. Small right-sided pleural effusion. 3. Fiducial markers at the right lung base. Known right-sided mass is better visualized on prior CTs. Electronically Signed   By: Constance Holster M.D.   On: 06/30/2019 18:04   Dg Chest Port 1 View  Result Date: 06/30/2019 CLINICAL DATA:  Pneumothorax on radiation planning CT EXAM: PORTABLE CHEST 1 VIEW COMPARISON:  June 24, 2019 FINDINGS: Small to moderate right apical pneumothorax. There is no mediastinal shift. Small right pleural effusion and adjacent atelectasis. Left lung is clear. Normal heart size. Calcified plaque is present along the aortic arch. Surgical clips overlie the right lung base. IMPRESSION: Small to moderate right apical pneumothorax. Small right pleural effusion and adjacent atelectasis. Electronically Signed   By: Macy Mis M.D.   On: 06/30/2019 16:31   Ct Outside Films Chest  Result Date: 06/30/2019 This examination belongs to an outside facility and is stored here for  comparison purposes only.  Contact the originating outside institution for any associated report or interpretation.       Scheduled Meds: . amLODipine  10 mg Oral Daily  . aspirin EC  81 mg Oral Daily  . atorvastatin  40 mg Oral Daily  . clonazePAM  1 mg Oral QHS  . fenofibrate  54 mg Oral Daily  . FLUoxetine  40 mg Oral Daily  . gabapentin  1,200 mg Oral BID  . hydrochlorothiazide  25 mg Oral Daily  . insulin aspart  0-20 Units Subcutaneous TID WC  . insulin aspart  0-5 Units Subcutaneous QHS  . levothyroxine  100 mcg Oral Q0600  . potassium chloride  40 mEq Oral BID   Continuous Infusions:   LOS: 0 days    Time spent: 25 minutes    Edwin Dada, MD [facility] 07/01/2019, 7:50 AM     Please page though [place] or Epic secure chat:  For [employer], Adult nurse

## 2019-07-01 NOTE — Consult Note (Signed)
@LOGO @  Ashley [name], female    DOB: November 11, 1945, 73 y.o.   MRN: [id number]   Brief patient profile:  74 yowf smoker severely debilitated with incidental RLL nodule with ?SUV 2.8 > navigational tbbx done 06/24/2019 non-dx and referred to RT where CT chest done for planning and found to have R ptx so sent to er.  Pt remembers bad cough p procedure but stopped w/iin 24 h and none since, nor sob (though basically housebound/ extremely sedentary spending most of her time in bed x one year) nor cp.    History of Present Illness  06/30/2019  Pulmonary/ 1st office eval/Casi Westerfeld  Chief Complaint  Patient presents with  . Chest Injury    Pneumothorax   Dyspnea:  denies Cough: none Sleep: ok  1-2 pillows  SABA use: none  No obvious day to day or daytime variability or assoc excess/ purulent sputum or mucus plugs or hemoptysis or cp or chest tightness, subjective wheeze or overt sinus or hb symptoms.   Sleeping  without nocturnal  or early am exacerbation  of respiratory  c/o's or need for noct saba. Also denies any obvious fluctuation of symptoms with weather or environmental changes or other aggravating or alleviating factors except as outlined above   No unusual exposure hx or h/o childhood pna/ asthma or knowledge of premature birth.  Current Allergies, Complete Past Medical History, Past Surgical History, Family History, and Social History were reviewed in [employer] record.  ROS  The following are not active complaints unless bolded Hoarseness, sore throat, dysphagia, dental problems, itching, sneezing,  nasal congestion or discharge of excess mucus or purulent secretions, ear ache,   fever, chills, sweats, unintended wt loss or wt gain, classically pleuritic or exertional cp,  orthopnea pnd or arm/hand swelling  or leg swelling, presyncope, palpitations, abdominal pain, anorexia, nausea, vomiting, diarrhea  or change in bowel habits or change in bladder habits, change in  stools or change in urine, dysuria, hematuria,  rash, arthralgias, visual complaints, headache, numbness, weakness or ataxia or problems with walking or coordination,  change in mood or  memory.           Past Medical History:  Diagnosis Date  . Anxiety disorder   . Arthritis LOWER BACK  . Asthma   . Bipolar 1 disorder ([place])   . Carpal tunnel syndrome   . Chronic kidney disease    stage 3  . Depression   . Diabetes mellitus    type 2  . Dyslipidemia   . Dyspnea    with much activity  . Gait disorder   . GERD (gastroesophageal reflux disease)   . Gout LEFT FOOT-  STABLE  . H/O hiatal hernia   . History of CVA (cerebrovascular accident) FOUND PER MRI 1994--  RESIDUAL MEMORY IMPAIRED  . Hypercholesteremia   . Hypertension   . Memory difficulty 08/14/2016  . Neuropathy of lower extremity   . OCD (obsessive compulsive disorder)   . Peripheral neuropathy   . SOB (shortness of breath) on exertion   . Stroke ([place])    2000 memory loss   . SUI (stress urinary incontinence, female)   . Vulva cancer ([place]) 07/07/2012  . Vulvar lesion       No current facility-administered medications on file prior to encounter.    Current Outpatient Medications on File Prior to Encounter  Medication Sig Dispense Refill  . acetaminophen (TYLENOL) 500 MG tablet Take 1,000 mg by mouth every 6 (six) hours  as needed for mild pain or moderate pain.    Marland Kitchen amLODipine (NORVASC) 5 MG tablet Take 1 tablet Daily for BP & Heart (Patient taking differently: Take 5 mg by mouth daily. ) 90 tablet 1  . aspirin EC 81 MG tablet Take 81 mg by mouth daily.    Marland Kitchen atorvastatin (LIPITOR) 40 MG tablet TAKE 1 TABLET DAILY BY MOUTH. (Patient taking differently: Take 40 mg by mouth daily. ) 90 tablet 1  . Cholecalciferol (VITAMIN D3) 125 MCG/0.5ML LIQD Take 10,000 Units by mouth daily.    . Cinnamon 500 MG TABS Take 500 mg by mouth daily.     . clonazePAM (KLONOPIN) 1 MG tablet Take 1 mg by mouth at bedtime.     . famotidine  (PEPCID) 20 MG tablet Take 1 tablet 2 x /day with meals for Acid Indigestion (Patient taking differently: Take 20 mg by mouth 2 (two) times daily. with meals for Acid Indigestion) 180 tablet 3  . fenofibrate (TRICOR) 145 MG tablet Take 1 tablet Daily for Triglycerides (Blood Fats) (Patient taking differently: Take 145 mg by mouth daily. for Triglycerides (Blood Fats)) 90 tablet 1  . ferrous sulfate 325 (65 FE) MG EC tablet Two times daily with Vitamin C (Patient taking differently: Take 325 mg by mouth daily. ) 180 tablet 1  . FLUoxetine (PROZAC) 40 MG capsule TAKE ONE CAPSULE BY MOUTH EVERY DAY (Patient taking differently: Take 40 mg by mouth daily. ) 90 capsule 3  . gabapentin (NEURONTIN) 600 MG tablet Take 1/2- 1 tablet 2 x /day at Breakfast & Bedtime for Diabetic Neuropathy pains (Patient taking differently: Take 1,200 mg by mouth 2 (two) times daily. Breakfast & Bedtime for Diabetic Neuropathy pains) 180 tablet 3  . glipiZIDE (GLUCOTROL) 5 MG tablet Take 1 tablet 3 x  /day with Meals for Diabetes (Patient taking differently: Take 5 mg by mouth 3 (three) times daily with meals. ) 270 tablet 1  . hydrochlorothiazide (MICROZIDE) 12.5 MG capsule Take 12.5 mg by mouth daily.    Marland Kitchen ipratropium (ATROVENT) 0.06 % nasal spray Use 1 to 2 sprays each nostril 2 to 3 x / day as needed (Patient taking differently: Place 2 sprays into both nostrils daily as needed for rhinitis. ) 15 mL 11  . levothyroxine (SYNTHROID) 100 MCG tablet Take 1 tablet (100 mcg total) by mouth daily. 90 tablet 1  . Magnesium 400 MG TABS Take 400 mg by mouth 3 (three) times daily with meals.     . metFORMIN (GLUCOPHAGE-XR) 500 MG 24 hr tablet Take 2 tablets 2 x /day with Meals for Diabetes 360 tablet 3  . Multiple Vitamins-Minerals (MULTIVITAMIN WITH MINERALS) tablet Take 1 tablet by mouth daily.    Marland Kitchen NOVOLIN N 100 UNIT/ML injection INJECT 50 UNITS EVERY MORNING AND 25 UNITS EVERY EVENING (Patient taking differently: Inject 30-50 Units  into the skin daily before breakfast. 130 or higher 30 units 200- 50 units) 30 mL 4  . ondansetron (ZOFRAN ODT) 8 MG disintegrating tablet Take 1 tablet (8 mg total) by mouth every 8 (eight) hours as needed for nausea or vomiting. 12 tablet 0  . potassium chloride (KLOR-CON M10) 10 MEQ tablet Take 1 tablet 3 x /day for Low Potassium 90 tablet 0  . vitamin C (ASCORBIC ACID) 500 MG tablet Take 500 mg by mouth daily.    . Zinc 50 MG CAPS Take 50 mg by mouth daily.     Marland Kitchen glucose blood (ONE TOUCH ULTRA TEST) test strip  TEST BLOOD SUGAR 3 TIMES A DAY (DX E11.9) 300 each 1  . [DISCONTINUED] losartan (COZAAR) 100 MG tablet Take 1 tablet Daily for BP & Diabetic Kidney Protection 90 tablet 1     Objective:     BP 130/64 (BP Location: Right Arm)   Pulse 72   Temp 98 F (36.7 C) (Oral)   Resp 20   Ht 5\' 3"  (1.6 m)   SpO2 100%   BMI 29.90 kg/m   SpO2: 100 % O2 Flow Rate (L/min): 5 L/min   Obese wf nad   HEENT : orophx clear   NECK :  without JVD/Nodes/TM/ nl carotid upstrokes bilaterally   LUNGS: no acc muscle use,  Min barrel  contour chest wall with bilateral  slightly decreased bs s audible wheeze and  without cough on insp or exp maneuvers and min  Hyperresonant  to  percussion bilaterally     CV:  RRR  no s3 or murmur or increase in P2, and no edema   ABD:  soft and nontender with pos end  insp Hoover's  in the supine position. No bruits or organomegaly appreciated, bowel sounds nl  MS:   Nl gait/  ext warm without deformities, calf tenderness, cyanosis or clubbing No obvious joint restrictions   SKIN: warm and dry without lesions    NEURO:  alert, approp, nl sensorium with  no motor or cerebellar deficits apparent.        I personally reviewed images and agree with radiology impression as follows:  pCXR:   06/30/2019  1. Small right-sided pneumothorax, decreased from recent prior chest x-ray. 2. Small right-sided pleural effusion. 3. Fiducial markers at the right lung  base. Known right-sided mass is better visualized on prior CTs.       Assessment   1)  Small asymptomatic R PTX p navigational tbbx  06/24/2019 on 6lpm over night to see if trending toward reabsorption which would indicate should heal s intervention.  rec repeat cxr pa and lateral today and if ok then d/c 02 and ok to d/c when [facility]ad ready but don't do RT f/u for 2 weeks as ideally need 100% resolution of ptx before targeting can be accurate.   2) Active cig smoking > says has quit but has big urge > reinforced with plan to f/u with PCP as no evidence of signicant copd clinically (PFTs ordered by not done yet)  >>> As I explained to this patient in detail:  although there may be copd present, it may not be clinically relevant:   it does not appear to be limiting activity tolerance any more than a set of worn tires limits someone from driving a car  around a parking lot.  A new set of Michelins might look good but would have no perceived impact on the performance of the car and would not be worth the cost.  That is to say:   this pt is so sedentary I don't recommend aggressive pulmonary rx at this point unless limiting symptoms arise or acute exacerbations become as issue, neither of which is the case now.  I asked the patient to contact Dr Windell Norfolk at any time in the future should either of these problems arise.      Christinia Gully, MD Pulmonary and [place] [phone] After 5:30 PM or weekends, use [facility] [phone]

## 2019-07-01 NOTE — Progress Notes (Signed)
PA and lateral shows very small apical ptx which is really not changed considering comparison to ct and pCXR which is apples to oranges to tangerines issue.  She is completely asymptomatic and this should heal on its own with use of 02 to help with reabsorption by dropping the venous nitrogen level below whatever is in the ptx and if there is still a leak the higher the 02 content in the leak the quicker it will resolve.     Christinia Gully, MD Pulmonary and [place] [phone] After 5:30 PM or weekends, use [facility] [phone]

## 2019-07-01 NOTE — Progress Notes (Signed)
MEWS score now green.  

## 2019-07-01 NOTE — TOC Initial Note (Signed)
Transition of Care [facility]) - Initial/Assessment Note    Patient Details  Name: Ashley [name] MRN: [id number] Date of Birth: [birth year]  Transition of Care [facility]) CM/SW Contact:    Dessa Phi, RN Phone Number: 07/01/2019, 2:59 PM  Clinical Narrative: [place] d/c plans home. Noted on 02-will monitor if needed @ home.                  Expected Discharge Plan: Home/Self Care Barriers to Discharge: Continued Medical Work up   Patient Goals and CMS Choice        Expected Discharge Plan and Services Expected Discharge Plan: Home/Self Care   Discharge Planning Services: CM Consult   Living arrangements for the past 2 months: Single Family Home                                      Prior Living Arrangements/Services Living arrangements for the past 2 months: Single Family Home Lives with:: Spouse Patient language and need for interpreter reviewed:: Yes Do you feel safe going back to the place where you live?: Yes      Need for Family Participation in Patient Care: No (Comment) Care giver support system in place?: Yes (comment)   Criminal Activity/Legal Involvement Pertinent to Current Situation/Hospitalization: No - Comment as needed  Activities of Daily Living Home Assistive Devices/Equipment: Eyeglasses, Cane (specify quad or straight), CBG Meter, Walker (specify type) ADL Screening (condition at time of admission) Patient's cognitive ability adequate to safely complete daily activities?: Yes Is the patient deaf or have difficulty hearing?: Yes Does the patient have difficulty seeing, even when wearing glasses/contacts?: Yes Does the patient have difficulty concentrating, remembering, or making decisions?: Yes Patient able to express need for assistance with ADLs?: Yes Does the patient have difficulty dressing or bathing?: No Independently performs ADLs?: No Communication: Independent Dressing (OT): Independent Grooming: Independent Feeding:  Independent Bathing: Independent Toileting: Independent with device (comment) In/Out Bed: Independent with device (comment) Walks in Home: Independent with device (comment) Does the patient have difficulty walking or climbing stairs?: Yes Weakness of Legs: Both Weakness of Arms/Hands: Both  Permission Sought/Granted Permission sought to share information with : Case Manager Permission granted to share information with : Yes, Verbal Permission Granted  Share Information with NAME: Hannah Beat RN [phone]           Emotional Assessment Appearance:: Appears stated age Attitude/Demeanor/Rapport: Gracious Affect (typically observed): Accepting Orientation: : Oriented to Self, Oriented to Place, Oriented to  Time, Oriented to Situation Alcohol / Substance Use: Not Applicable Psych Involvement: No (comment)  Admission diagnosis:  Pneumothorax [J93.9] Patient Active Problem List   Diagnosis Date Noted  . Pneumothorax 06/30/2019  . Hypokalemia 06/30/2019  . Hypertensive urgency 06/30/2019  . CKD (chronic kidney disease), stage IV ([place]) 06/30/2019  . Right lower lobe pulmonary nodule 06/24/2019  . Long term (current) use of insulin ([place]) 02/21/2019  . Obesity (BMI 30.0-34.9) 01/06/2018  . Diabetes mellitus type 2, uncontrolled ([place]) 12/17/2017  . Peripheral arterial disease ([place]) 10/20/2017  . Polyp of ascending colon   . Mild nonproliferative diabetic retinopathy of both eyes without macular edema associated with type 2 diabetes mellitus ([place]) 06/09/2017  . Senile purpura ([place]) 06/09/2017  . Hyperlipidemia associated with type 2 diabetes mellitus ([place]) 06/08/2017  . Memory difficulty 08/14/2016  . Frequent falls 04/08/2016  . Hypothyroidism 03/05/2016  . GERD (gastroesophageal reflux disease) 03/05/2016  .  Diabetic CKD 3  (GFR 44 ml/min) (HCC) 03/05/2016  . Gout 08/29/2015  . Non compliance w medication regimen 02/20/2015  . Bipolar depression ([place]) 11/06/2014  . Vitamin D  deficiency 12/01/2013  . Medication management 12/01/2013  . Abnormality of gait 03/10/2013  . Other vitamin B12 deficiency anemia 10/25/2012  . Carpal tunnel syndrome 10/25/2012  . Diabetic Peripheral Neuropathy 10/25/2012  . Major depressive disorder, recurrent episode ([place]) 10/12/2009  . Essential hypertension 10/12/2009  . COPD 10/12/2009   PCP:  Unk Pinto, MD Pharmacy:   [id number] - , [place] [address] Phone: [phone] Fax: [phone]     Social Determinants of Health (SDOH) Interventions    Readmission Risk Interventions No flowsheet data found.

## 2019-07-02 ENCOUNTER — Inpatient Hospital Stay (HOSPITAL_COMMUNITY): Payer: PPO

## 2019-07-02 DIAGNOSIS — E038 Other specified hypothyroidism: Secondary | ICD-10-CM

## 2019-07-02 DIAGNOSIS — J930 Spontaneous tension pneumothorax: Secondary | ICD-10-CM

## 2019-07-02 DIAGNOSIS — N184 Chronic kidney disease, stage 4 (severe): Secondary | ICD-10-CM

## 2019-07-02 DIAGNOSIS — E876 Hypokalemia: Secondary | ICD-10-CM

## 2019-07-02 DIAGNOSIS — I160 Hypertensive urgency: Secondary | ICD-10-CM

## 2019-07-02 LAB — GLUCOSE, CAPILLARY
Glucose-Capillary: 130 mg/dL — ABNORMAL HIGH (ref 70–99)
Glucose-Capillary: 155 mg/dL — ABNORMAL HIGH (ref 70–99)
Glucose-Capillary: 235 mg/dL — ABNORMAL HIGH (ref 70–99)
Glucose-Capillary: 205 mg/dL — ABNORMAL HIGH (ref 70–99)

## 2019-07-02 NOTE — Progress Notes (Addendum)
PROGRESS NOTE  Ashley [name] JKD:[id number] DOB: 19-Oct-1945 DOA: 06/30/2019 PCP: Unk Pinto, MD  HPI/Recap of past 24 hours: Patient seen and examined at bedside she is disappointed that she could not go home today.  Still complaining of very fatigued and dyspnea on exertion  Assessment/Plan: Active Problems:   Bipolar depression (HCC)   Hypothyroidism   Peripheral arterial disease (HCC)   Diabetes mellitus type 2, uncontrolled (HCC)   Obesity (BMI 30.0-34.9)   Pneumothorax   Hypokalemia   Hypertensive urgency   CKD (chronic kidney disease), stage IV ([place])  #1 asymptomatic right pneumothorax conservative treatment.  Patient is completely asymptomatic and per critical care this should heal on its own with use of oxygen to help with reabsorption by dropping the venous nitrogen level below what ever is the pneumothorax and and she could go home tomorrow because the pneumothorax is so tiny and no need to repeat CT for 2 weeks since no need to do immediate RT anyway 2.  Hypertensive urgency/essential hypertension patient presented with high blood pressure greater than 240/110 without encephalopathy.  Blood pressure was reduced by 25%.  She has better blood pressure but still not well controlled.  We will continue amlodipine, and hydrochlorothiazide, as needed IV labetalol and p.o. hydralazine as needed for out of range blood pressures  3.  Diabetes well controlled continue sliding scale with insulin her glipizide and Metformin are on hold  4.  Hyperlipidemia continue atorvastatin and aspirin  5.  Diabetic neuropathy her gabapentin was reduced because of her chronic kidney disease  6.  Chronic kidney disease stage IV.  Continue to monitor creatinine baseline is around 2.4  7.  Hypothyroidism continue levothyroxine  8.  Depression/anxiety continue home clonazepam and Prozac  9.  Anemia of chronic disease due to chronic kidney disease stage IV we will continue to monitor H&H       Code Status: Full  Severity of Illness: The appropriate patient status for this patient is INPATIENT. Inpatient status is judged to be reasonable and necessary in order to provide the required intensity of service to ensure the patient's safety. The patient's presenting symptoms, physical exam findings, and initial radiographic and laboratory data in the context of their chronic comorbidities is felt to place them at high risk for further clinical deterioration. Furthermore, it is not anticipated that the patient will be medically stable for discharge from the hospital within 2 midnights of admission. The following factors support the patient status of inpatient.   " The patient's presenting symptoms include respiratory failure. " The worrisome physical exam findings include respiratory failure fatigue.  Uncontrolled hypertension " The initial radiographic and laboratory data are worrisome because of respiratory failure. " The chronic co-morbidities include tobacco abuse chronic respiratory failure.   * I certify that at the point of admission it is my clinical judgment that the patient will require inpatient hospital care spanning beyond 2 midnights from the point of admission due to high intensity of service, high risk for further deterioration and high frequency of surveillance required.*    Family Communication: Patient  Disposition Plan: Home in the morning   Consultants:  Critical care pulmonology  Procedures:  None  Antimicrobials:  None  DVT prophylaxis: SCD   Objective: Vitals:   07/01/19 1801 07/01/19 2059 07/02/19 0107 07/02/19 0444  BP: (!) 176/78 (!) 164/77 (!) 181/85 (!) 186/83  Pulse: 70 79 76 76  Resp: 16 18 18 18   Temp: 97.7 F (36.5 C) 98.7 F (37.1  C) 98.7 F (37.1 C) 98 F (36.7 C)  TempSrc: Oral Oral Oral Oral  SpO2: 100% 100% 100% 98%  Height:       No intake or output data in the 24 hours ending 07/02/19 0918 There were no vitals filed  for this visit. Body mass index is 29.9 kg/m.  Exam:  . General: 73 y.o. year-old female well developed well nourished in no acute distress.  Alert and oriented x3.  Is not no respiratory distress . Cardiovascular: Regular rate and rhythm with no rubs or gallops.  No thyromegaly or JVD noted.   Marland Kitchen Respiratory: Expiratory wheeze good inspiratory effort. . Abdomen: Soft nontender nondistended with normal bowel sounds x4 quadrants. . Musculoskeletal: No lower extremity edema. 2/4 pulses in all 4 extremities. . Skin: No ulcerative lesions noted or rashes, . Psychiatry: Mood is appropriate for condition and setting    Data Reviewed: CBC: Recent Labs  Lab 06/30/19 1700 06/30/19 1715 07/01/19 0532  WBC 5.5  --  5.6  NEUTROABS 3.8  --   --   HGB 11.1* 10.9* 9.6*  HCT 34.6* 32.0* 30.2*  MCV 89.9  --  90.1  PLT 329  --  017   Basic Metabolic Panel: Recent Labs  Lab 06/30/19 1700 06/30/19 1715 07/01/19 0532  NA  --  141 141  K  --  2.9* 3.6  CL  --  102 108  CO2  --   --  25  GLUCOSE  --  190* 148*  BUN  --  28* 33*  CREATININE  --  2.80* 2.78*  CALCIUM  --   --  9.0  MG 2.0  --   --    GFR: Estimated Creatinine Clearance: 17.9 mL/min (A) (by C-G formula based on SCr of 2.78 mg/dL (H)). Liver Function Tests: No results for input(s): AST, ALT, ALKPHOS, BILITOT, PROT, ALBUMIN in the last 168 hours. No results for input(s): LIPASE, AMYLASE in the last 168 hours. No results for input(s): AMMONIA in the last 168 hours. Coagulation Profile: No results for input(s): INR, PROTIME in the last 168 hours. Cardiac Enzymes: No results for input(s): CKTOTAL, CKMB, CKMBINDEX, TROPONINI in the last 168 hours. BNP (last 3 results) No results for input(s): PROBNP in the last 8760 hours. HbA1C: Recent Labs    07/01/19 0532  HGBA1C 7.5*   CBG: Recent Labs  Lab 07/01/19 0753 07/01/19 1110 07/01/19 1623 07/01/19 2058 07/02/19 0843  GLUCAP 124* 105* 207* 286* 130*   Lipid  Profile: No results for input(s): CHOL, HDL, LDLCALC, TRIG, CHOLHDL, LDLDIRECT in the last 72 hours. Thyroid Function Tests: No results for input(s): TSH, T4TOTAL, FREET4, T3FREE, THYROIDAB in the last 72 hours. Anemia Panel: No results for input(s): VITAMINB12, FOLATE, FERRITIN, TIBC, IRON, RETICCTPCT in the last 72 hours. Urine analysis:    Component Value Date/Time   COLORURINE YELLOW 05/24/2019 1454   APPEARANCEUR CLEAR 05/24/2019 1454   LABSPEC 1.011 05/24/2019 1454   PHURINE 6.5 05/24/2019 1454   GLUCOSEU TRACE (A) 05/24/2019 1454   HGBUR TRACE (A) 05/24/2019 1454   BILIRUBINUR NEGATIVE 05/15/2019 1748   KETONESUR NEGATIVE 05/24/2019 1454   PROTEINUR 3+ (A) 05/24/2019 1454   UROBILINOGEN 0.2 10/26/2014 1610   NITRITE NEGATIVE 05/24/2019 1454   LEUKOCYTESUR TRACE (A) 05/24/2019 1454   Sepsis Labs: @LABRCNTIP (procalcitonin:4,lacticidven:4)  ) Recent Results (from the past 240 hour(s))  Culture, respiratory     Status: None   Collection Time: 06/24/19  9:27 AM   Specimen: Bronchial Alveolar Lavage;  Respiratory  Result Value Ref Range Status   Specimen Description BRONCHIAL ALVEOLAR LAVAGE  Final   Special Requests NONE  Final   Gram Stain   Final    RARE WBC PRESENT, PREDOMINANTLY PMN RARE GRAM POSITIVE COCCI IN PAIRS    Culture   Final    Consistent with normal respiratory flora. Performed at [facility], [place] [address], [place], [place] [address]    Report Status 06/26/2019 FINAL  Final  Acid Fast Smear (AFB)     Status: None   Collection Time: 06/24/19  9:27 AM   Specimen: Bronchial Alveolar Lavage; Respiratory  Result Value Ref Range Status   AFB Specimen Processing Concentration  Final   Acid Fast Smear Negative  Final    Comment: (NOTE) Performed At: [facility] [place], [place] [id number] Rush Farmer MD SE:[id number]    Source (AFB) BRONCHIAL ALVEOLAR LAVAGE  Final    Comment: Performed at [facility], [place] [address], [place], [place] [address]  Fungus Culture With Stain     Status: None (Preliminary result)   Collection Time: 06/24/19  9:27 AM  Result Value Ref Range Status   Fungus Stain Final report  Final    Comment: (NOTE) Performed At: [facility] [place], [place] [id number] Rush Farmer MD EV:[id number]    Fungus (Mycology) Culture PENDING  Incomplete   Fungal Source BRONCHIAL ALVEOLAR LAVAGE  Final    Comment: Performed at [facility], [place] [address], [place], [place] [address]  Fungus Culture Result     Status: None   Collection Time: 06/24/19  9:27 AM  Result Value Ref Range Status   Result 1 Comment  Final    Comment: (NOTE) KOH/Calcofluor preparation:  no fungus observed. Performed At: [facility] [place], [place] [id number] Rush Farmer MD BO:[id number]   SARS CORONAVIRUS 2 (TAT 6-24 HRS) Nasopharyngeal Nasopharyngeal Swab     Status: None   Collection Time: 06/30/19  5:16 PM   Specimen: Nasopharyngeal Swab  Result Value Ref Range Status   SARS Coronavirus 2 NEGATIVE NEGATIVE Final    Comment: (NOTE) SARS-CoV-2 target nucleic acids are NOT DETECTED. The SARS-CoV-2 RNA is generally detectable in upper and lower respiratory specimens during the acute phase of infection. Negative results do not preclude SARS-CoV-2 infection, do not rule out co-infections with other pathogens, and should not be used as the sole basis for treatment or other patient management decisions. Negative results must be combined with clinical observations, patient history, and epidemiological information. The expected result is Negative. Fact Sheet for Patients: SugarRoll.be Fact Sheet for Healthcare Providers: https://www.woods-mathews.com/ This test is not yet approved or cleared by the [place] FDA and  has been authorized for detection and/or diagnosis of SARS-CoV-2 by FDA under an Emergency  Use Authorization (EUA). This EUA will remain  in effect (meaning this test can be used) for the duration of the COVID-19 declaration under Section 56 4(b)(1) of the Act, 21 U.S.C. section 360bbb-3(b)(1), unless the authorization is terminated or revoked sooner. Performed at [facility], [place] [address] [place], [place] [address]       Studies: Dg Chest 2 View  Result Date: 07/01/2019 CLINICAL DATA:  Pneumothorax. EXAM: CHEST - 2 VIEW COMPARISON:  June 30, 2019. FINDINGS: The heart size and mediastinal contours are within normal limits. Left lung is clear. Small right apical pneumothorax is noted which is slightly in large compared to  prior exam. Mild right basilar opacity is noted with surgical markers with small right pleural effusion. The visualized skeletal structures are unremarkable. IMPRESSION: Small right apical pneumothorax is noted which is slightly enlarged compared to prior exam. Mild right basilar opacity is noted with small right pleural effusion. Aortic Atherosclerosis (ICD10-I70.0). Electronically Signed   By: Marijo Conception M.D.   On: 07/01/2019 14:09    Scheduled Meds: . amLODipine  10 mg Oral Daily  . aspirin EC  81 mg Oral Daily  . atorvastatin  40 mg Oral Daily  . clonazePAM  1 mg Oral QHS  . fenofibrate  54 mg Oral Daily  . FLUoxetine  40 mg Oral Daily  . gabapentin  300 mg Oral BID  . hydrochlorothiazide  25 mg Oral Daily  . insulin aspart  0-20 Units Subcutaneous TID WC  . insulin aspart  0-5 Units Subcutaneous QHS  . levothyroxine  100 mcg Oral Q0600    Continuous Infusions:   LOS: 1 day     Cristal Deer, MD [facility]  To reach me or the doctor on call, go to: [email or web address] Password [email or web address]  07/02/2019, 9:18 AM

## 2019-07-02 NOTE — Progress Notes (Signed)
@  LOGO@  Ashley [name], female    DOB: 10-17-1945, 73 y.o.   MRN: [id number]   Brief patient profile:  70 yowf smoker severely debilitated with incidental RLL nodule with  SUV 2.8 > navigational tbbx done 06/24/2019 non-dx and referred to RT where CT chest done for planning >>  Small  R ptx so sent to er.  Pt remembers bad cough p procedure but resolved w/in 24 h and none since, nor sob (though basically housebound/ extremely sedentary spending most of her time in bed x one year) nor cp.  rec was 02  6lpm / continue bedrest    07/02/2019 No cp/ sob          Objective:     BP (!) 174/77 (BP Location: Right Arm)   Pulse 75   Temp 98 F (36.7 C) (Oral)   Resp 18   Ht 5\' 3"  (1.6 m)   SpO2 98%   BMI 29.90 kg/m   SpO2: 98 % O2 Flow Rate (L/min): 6 L/min     HEENT : pt wearing mask not removed for exam due to covid - 19 concerns.   NECK :  without JVD/Nodes/TM/ nl carotid upstrokes bilaterally   LUNGS: no acc muscle use,  Min barrel  contour chest wall with bilateral  slightly decreased bs s audible wheeze and  without cough on insp or exp maneuvers and min  Hyperresonant  to  percussion bilaterally     CV:  RRR  no s3 or murmur or increase in P2, and no edema   ABD:  soft and nontender with pos end  insp Hoover's  in the supine position. No bruits or organomegaly appreciated, bowel sounds nl  MS:    ext warm without deformities, calf tenderness, cyanosis or clubbing No obvious joint restrictions   SKIN: warm and dry without lesions    NEURO:  alert, approp, nl sensorium with  no motor or cerebellar deficits apparent.        I personally reviewed images and agree with radiology impression as follows:  pCXR:  11/14 No def ptx         Assessment   1)  Small asymptomatic R PTX p navigational tbbx  06/24/2019 on 6lpm over night ? Resolved as of am 11/14  Could probably go home in am as the ptx is tiny if still present but no need to repeat ct chest for 2 weeks since no  need to do immediate RT anyway      2) Active cig smoking > reinforced need to quit     Christinia Gully, MD [facility] [place] [facility] [phone] After 5:30 PM or weekends, use Beeper [phone]

## 2019-07-03 DIAGNOSIS — E038 Other specified hypothyroidism: Secondary | ICD-10-CM

## 2019-07-03 DIAGNOSIS — J930 Spontaneous tension pneumothorax: Secondary | ICD-10-CM

## 2019-07-03 DIAGNOSIS — I160 Hypertensive urgency: Secondary | ICD-10-CM

## 2019-07-03 DIAGNOSIS — E876 Hypokalemia: Secondary | ICD-10-CM

## 2019-07-03 DIAGNOSIS — N184 Chronic kidney disease, stage 4 (severe): Secondary | ICD-10-CM

## 2019-07-03 LAB — GLUCOSE, CAPILLARY
Glucose-Capillary: 178 mg/dL — ABNORMAL HIGH (ref 70–99)
Glucose-Capillary: 212 mg/dL — ABNORMAL HIGH (ref 70–99)
Glucose-Capillary: 273 mg/dL — ABNORMAL HIGH (ref 70–99)

## 2019-07-03 MED ORDER — HYDROCHLOROTHIAZIDE 12.5 MG PO CAPS: 25.0000 mg | ORAL_CAPSULE | Freq: Every day | ORAL | 0 refills | Status: DC

## 2019-07-03 MED ORDER — HYDRALAZINE HCL 10 MG PO TABS
10.0000 mg | ORAL_TABLET | Freq: Three times a day (TID) | ORAL | Status: DC
  Administered 2019-07-03 (×2): 10 mg via ORAL
  Filled 2019-07-03 (×2): qty 1

## 2019-07-03 MED ORDER — HYDRALAZINE HCL 10 MG PO TABS: 10.0000 mg | ORAL_TABLET | Freq: Three times a day (TID) | ORAL | 0 refills | Status: DC

## 2019-07-03 MED ORDER — AMLODIPINE BESYLATE 10 MG PO TABS: 10.0000 mg | ORAL_TABLET | Freq: Every day | ORAL | 0 refills | Status: AC

## 2019-07-03 NOTE — Progress Notes (Signed)
AVS given to patient and explained at the bedside. Medications and follow up appointments have been explained with pt verbalizing understanding. No further interventions ordered from MD, ok to proceed with discharge.

## 2019-07-03 NOTE — Discharge Summary (Signed)
Discharge Summary  Ashley [name] ELF:[id number] DOB: 16-Jun-1946  PCP: Unk Pinto, MD  Admit date: 06/30/2019 Discharge date: 07/03/2019  Time spent: More than 35 minutes  Recommendations for Outpatient Follow-up:  1. Follow-up with outpatient primary care  Discharge Diagnoses:  Active Hospital Problems   Diagnosis Date Noted   Pneumothorax 06/30/2019   Hypokalemia 06/30/2019   Hypertensive urgency 06/30/2019   CKD (chronic kidney disease), stage IV ([place]) 06/30/2019   Obesity (BMI 30.0-34.9) 01/06/2018   Diabetes mellitus type 2, uncontrolled ([place]) 12/17/2017   Peripheral arterial disease ([place]) 10/20/2017   Hypothyroidism 03/05/2016   Bipolar depression ([place]) 11/06/2014    Resolved Hospital Problems  No resolved problems to display.    Discharge Condition: Improved  Diet recommendation: Renal  Vitals:   07/03/19 1423 07/03/19 1543  BP:  (!) 146/72  Pulse:  81  Resp:    Temp:    SpO2: 92%     History of present illness:  #1 asymptomatic right pneumothorax conservative treatment.  Patient is completely asymptomatic and per critical care this should heal on its own with use of oxygen to help with reabsorption by dropping the venous nitrogen level below what ever is the pneumothorax and and she could go home tomorrow because the pneumothorax is so tiny and no need to repeat CT for 2 weeks since no need to do immediate RT anyway 2.  Hypertensive urgency/essential hypertension patient presented with high blood pressure greater than 240/110 without encephalopathy.  Blood pressure was reduced by 25%.  She has better blood pressure but still not well controlled.  We will continue amlodipine, and hydrochlorothiazide, as needed IV labetalol and p.o. hydralazine as needed for out of range blood pressures  3.  Diabetes well controlled continue sliding scale with insulin her glipizide and Metformin are on hold  4.  Hyperlipidemia continue atorvastatin and  aspirin  5.  Diabetic neuropathy her gabapentin was reduced because of her chronic kidney disease  6.  Chronic kidney disease stage IV.  Continue to monitor creatinine baseline is around 2.4  7.  Hypothyroidism continue levothyroxine  8.  Depression/anxiety continue home clonazepam and Prozac  9.  Anemia of chronic disease due to chronic kidney disease stage IV we will continue to monitor H&H  Hospital Course:  Active Problems:   Bipolar depression ([place])   Hypothyroidism   Peripheral arterial disease ([place])   Diabetes mellitus type 2, uncontrolled ([place])   Obesity (BMI 30.0-34.9)   Pneumothorax   Hypokalemia   Hypertensive urgency   CKD (chronic kidney disease), stage IV ([place])  #1 asymptomatic right pneumothorax conservative treatment.  Patient is completely asymptomatic and per critical care this should heal on its own with use of oxygen to help with reabsorption by dropping the venous nitrogen level below what ever is the pneumothorax and and she could go home tomorrow because the pneumothorax is so tiny and no need to repeat CT for 2 weeks since no need to do immediate RT anyway 2.  Hypertensive urgency/essential hypertension patient presented with high blood pressure greater than 240/110 without encephalopathy.  Blood pressure was reduced by 25%.  She has better blood pressure but still not well controlled.  We will continue amlodipine, and hydrochlorothiazide, as needed IV labetalol and p.o. hydralazine as needed for out of range blood pressures  3.  Diabetes well controlled continue sliding scale with insulin her glipizide and Metformin are on hold  4.  Hyperlipidemia continue atorvastatin and aspirin  5.  Diabetic neuropathy her gabapentin  was reduced because of her chronic kidney disease  6.  Chronic kidney disease stage IV.  Continue to monitor creatinine baseline is around 2.4  7.  Hypothyroidism continue levothyroxine  8.  Depression/anxiety continue home  clonazepam and Prozac  9.  Anemia of chronic disease due to chronic kidney disease stage IV we will continue to monitor H&H   1. asymptomatic right pneumothorax conservative treatment.  Patient is completely asymptomatic and per critical care this should heal on its own with use of oxygen to help with reabsorption by dropping the venous nitrogen level below what ever is the pneumothorax and and she could go home tomorrow because the pneumothorax is so tiny and no need to repeat CT for 2 weeks since no need to do immediate RT anyway 2.  Hypertensive urgency/essential hypertension patient presented with high blood pressure greater than 240/110 without encephalopathy.  Blood pressure was reduced by 25%.  She has better blood pressure but still not well controlled.  We will continue amlodipine, and hydrochlorothiazide, as needed IV labetalol and p.o. hydralazine as needed for out of range blood pressures  3.  Diabetes well controlled continue sliding scale with insulin her glipizide and Metformin are on hold  4.  Hyperlipidemia continue atorvastatin and aspirin  5.  Diabetic neuropathy her gabapentin was reduced because of her chronic kidney disease  6.  Chronic kidney disease stage IV.  Continue to monitor creatinine baseline is around 2.4  7.  Hypothyroidism continue levothyroxine  8.  Depression/anxiety continue home clonazepam and Prozac  9.  Anemia of chronic disease due to chronic kidney disease stage IV we will continue to monitor H&H  10.  Patient had a fall in her room as she was ready to be discharged she slid down on the floor without hitting her head or anywhere  Procedures:  None  Consultations:  Pulmonary critical care  Discharge Exam: BP (!) 146/72    Pulse 81    Temp 98.3 F (36.8 C) (Oral)    Resp 16    Ht 5\' 3"  (1.6 m)    SpO2 92%    BMI 29.90 kg/m   General: Alert oriented x3 well-nourished Cardiovascular: Regular rate and rhythm Respiratory: Unlabored  no rales Extremity with no edema  Discharge Instructions You were cared for by a hospitalist during your hospital stay. If you have any questions about your discharge medications or the care you received while you were in the hospital after you are discharged, you can call the unit and asked to speak with the hospitalist on call if the hospitalist that took care of you is not available. Once you are discharged, your primary care physician will handle any further medical issues. Please note that NO REFILLS for any discharge medications will be authorized once you are discharged, as it is imperative that you return to your primary care physician (or establish a relationship with a primary care physician if you do not have one) for your aftercare needs so that they can reassess your need for medications and monitor your lab values.  Discharge Instructions    Call MD for:  difficulty breathing, headache or visual disturbances   Complete by: As directed    Diet - low sodium heart healthy   Complete by: As directed    Discharge instructions   Complete by: As directed    Follow-up pulmonary or primary care provider to repeat CT chest in 2 weeks Could probably go home in am as the ptx is tiny  if still present but no need to repeat ct chest for 2 weeks since no need to do immediate RT anyway     Increase activity slowly   Complete by: As directed      Allergies as of 07/03/2019      Reactions   Sulfa Antibiotics Other (See Comments)   Pt states she had "extreme pain"   Sulfacetamide Sodium Other (See Comments)   Pain      Medication List    TAKE these medications   acetaminophen 500 MG tablet Commonly known as: TYLENOL Take 1,000 mg by mouth every 6 (six) hours as needed for mild pain or moderate pain.   amLODipine 10 MG tablet Commonly known as: NORVASC Take 1 tablet (10 mg total) by mouth daily. Start taking on: July 04, 2019 What changed:   medication strength  how much to  take  how to take this  when to take this  additional instructions   aspirin EC 81 MG tablet Take 81 mg by mouth daily.   atorvastatin 40 MG tablet Commonly known as: LIPITOR TAKE 1 TABLET DAILY BY MOUTH.   Cinnamon 500 MG Tabs Take 500 mg by mouth daily.   clonazePAM 1 MG tablet Commonly known as: KLONOPIN Take 1 mg by mouth at bedtime.   famotidine 20 MG tablet Commonly known as: PEPCID Take 1 tablet 2 x /day with meals for Acid Indigestion What changed:   how much to take  how to take this  when to take this  additional instructions   fenofibrate 145 MG tablet Commonly known as: TRICOR Take 1 tablet Daily for Triglycerides (Blood Fats) What changed:   how much to take  how to take this  when to take this  additional instructions   ferrous sulfate 325 (65 FE) MG EC tablet Two times daily with Vitamin C What changed:   how much to take  how to take this  when to take this  additional instructions   FLUoxetine 40 MG capsule Commonly known as: PROZAC TAKE ONE CAPSULE BY MOUTH EVERY DAY   gabapentin 600 MG tablet Commonly known as: NEURONTIN Take 1/2- 1 tablet 2 x /day at Breakfast & Bedtime for Diabetic Neuropathy pains What changed:   how much to take  how to take this  when to take this  additional instructions   glipiZIDE 5 MG tablet Commonly known as: GLUCOTROL Take 1 tablet 3 x  /day with Meals for Diabetes What changed:   how much to take  how to take this  when to take this  additional instructions   glucose blood test strip Commonly known as: ONE TOUCH ULTRA TEST TEST BLOOD SUGAR 3 TIMES A DAY (DX E11.9)   hydrALAZINE 10 MG tablet Commonly known as: APRESOLINE Take 1 tablet (10 mg total) by mouth 3 (three) times daily.   hydrochlorothiazide 12.5 MG capsule Commonly known as: MICROZIDE Take 2 capsules (25 mg total) by mouth daily. Start taking on: July 04, 2019 What changed: how much to take    ipratropium 0.06 % nasal spray Commonly known as: ATROVENT Use 1 to 2 sprays each nostril 2 to 3 x / day as needed What changed:   how much to take  how to take this  when to take this  reasons to take this  additional instructions   levothyroxine 100 MCG tablet Commonly known as: SYNTHROID Take 1 tablet (100 mcg total) by mouth daily.   Magnesium 400 MG Tabs Take 400  mg by mouth 3 (three) times daily with meals.   metFORMIN 500 MG 24 hr tablet Commonly known as: GLUCOPHAGE-XR Take 2 tablets 2 x /day with Meals for Diabetes   multivitamin with minerals tablet Take 1 tablet by mouth daily.   NovoLIN N 100 UNIT/ML injection Generic drug: insulin NPH Human INJECT 50 UNITS EVERY MORNING AND 25 UNITS EVERY EVENING What changed: See the new instructions.   ondansetron 8 MG disintegrating tablet Commonly known as: Zofran ODT Take 1 tablet (8 mg total) by mouth every 8 (eight) hours as needed for nausea or vomiting.   potassium chloride 10 MEQ tablet Commonly known as: Klor-Con M10 Take 1 tablet 3 x /day for Low Potassium   vitamin C 500 MG tablet Commonly known as: ASCORBIC ACID Take 500 mg by mouth daily.   Vitamin D3 125 MCG/0.5ML Liqd Take 10,000 Units by mouth daily.   Zinc 50 MG Caps Take 50 mg by mouth daily.      Allergies  Allergen Reactions   Sulfa Antibiotics Other (See Comments)    Pt states she had "extreme pain"   Sulfacetamide Sodium Other (See Comments)    Pain      The results of significant diagnostics from this hospitalization (including imaging, microbiology, ancillary and laboratory) are listed below for reference.    Significant Diagnostic Studies: Dg Chest 1 View  Result Date: 06/30/2019 CLINICAL DATA:  Shortness of breath. EXAM: CHEST  1 VIEW COMPARISON:  June 30, 2019 at 4:22 p.m. FINDINGS: There is a persistent small right-sided pneumothorax which has decreased in size from prior study. There are multiple fiducial markers  overlying the right lower lung zone. There is a small right-sided pleural effusion. The left lung field is clear without evidence for pneumothorax. The heart size is stable. Aortic calcifications are noted. IMPRESSION: 1. Small right-sided pneumothorax, decreased from recent prior chest x-ray. 2. Small right-sided pleural effusion. 3. Fiducial markers at the right lung base. Known right-sided mass is better visualized on prior CTs. Electronically Signed   By: Constance Holster M.D.   On: 06/30/2019 18:04   Dg Chest 2 View  Result Date: 07/02/2019 CLINICAL DATA:  Chest pain and shortness of breath. EXAM: CHEST - 2 VIEW COMPARISON:  07/01/2019 FINDINGS: The heart remains normal in size. The aorta remains mildly tortuous and calcified. Clear lungs with normal vascularity. Stable right basilar surgical clips and old, healed left 7th rib fracture. IMPRESSION: No acute abnormality. Electronically Signed   By: Claudie Revering M.D.   On: 07/02/2019 11:53   Dg Chest 2 View  Result Date: 07/01/2019 CLINICAL DATA:  Pneumothorax. EXAM: CHEST - 2 VIEW COMPARISON:  June 30, 2019. FINDINGS: The heart size and mediastinal contours are within normal limits. Left lung is clear. Small right apical pneumothorax is noted which is slightly in large compared to prior exam. Mild right basilar opacity is noted with surgical markers with small right pleural effusion. The visualized skeletal structures are unremarkable. IMPRESSION: Small right apical pneumothorax is noted which is slightly enlarged compared to prior exam. Mild right basilar opacity is noted with small right pleural effusion. Aortic Atherosclerosis (ICD10-I70.0). Electronically Signed   By: Marijo Conception M.D.   On: 07/01/2019 14:09   Nm Pet Image Initial (pi) Skull Base To Thigh  Result Date: 06/14/2019 CLINICAL DATA:  Initial treatment strategy for right lower lobe pulmonary nodule. EXAM: NUCLEAR MEDICINE PET SKULL BASE TO THIGH TECHNIQUE: 8.3 mCi F-18 FDG  was injected intravenously. Full-ring PET imaging  was performed from the skull base to thigh after the radiotracer. CT data was obtained and used for attenuation correction and anatomic localization. Fasting blood glucose: 150 mg/dl COMPARISON:  Chest CT 06/02/2019.  Abdominopelvic CT 05/15/2019. FINDINGS: Mediastinal blood pool activity: SUV max 2.7 Liver activity: SUV max NA NECK: No areas of abnormal hypermetabolism. Incidental CT findings: No cervical adenopathy. Bilateral carotid atherosclerosis. CHEST: Hypermetabolism corresponding to the anterior right lower lobe pulmonary nodule. This measures maximally 1.5 cm and a S.U.V. max of 2.8 on image 38/8.3 No thoracic nodal hypermetabolism. Incidental CT findings: Aortic and coronary artery atherosclerosis. Mild cardiomegaly. ABDOMEN/PELVIS: No abdominopelvic parenchymal or nodal hypermetabolism. Incidental CT findings: Punctate right renal collecting system calculi. Abdominal aortic atherosclerosis. Mild left adrenal nodularity, likely due to a subcentimeter adenoma. Hysterectomy. SKELETON: No abnormal marrow activity. Incidental CT findings: none IMPRESSION: 1. Hypermetabolic anterior right lower lobe pulmonary nodule, consistent with primary bronchogenic carcinoma. No thoracic nodal or extrathoracic metastasis identified. Presuming non-small-cell histology, T1bN0M0 or stage IA. 2. Coronary artery atherosclerosis. Aortic Atherosclerosis (ICD10-I70.0). 3. Right nephrolithiasis. Electronically Signed   By: Abigail Miyamoto M.D.   On: 06/14/2019 11:56   Dg Chest Port 1 View  Result Date: 06/30/2019 CLINICAL DATA:  Pneumothorax on radiation planning CT EXAM: PORTABLE CHEST 1 VIEW COMPARISON:  June 24, 2019 FINDINGS: Small to moderate right apical pneumothorax. There is no mediastinal shift. Small right pleural effusion and adjacent atelectasis. Left lung is clear. Normal heart size. Calcified plaque is present along the aortic arch. Surgical clips overlie the  right lung base. IMPRESSION: Small to moderate right apical pneumothorax. Small right pleural effusion and adjacent atelectasis. Electronically Signed   By: Macy Mis M.D.   On: 06/30/2019 16:31   Dg Chest Port 1 View  Result Date: 06/24/2019 CLINICAL DATA:  Post bronchoscopy with biopsy. EXAM: PORTABLE CHEST 1 VIEW COMPARISON:  08/16/2011 and chest CT 06/02/2019 FINDINGS: Lungs are adequately inflated and demonstrate subtle hazy density over the right mid to lower lung which may be due to atelectasis/hemorrhage related to recent bronchoscopy with biopsies. There are a few surgical clips over the right lung base. No evidence of pneumothorax. Cardiomediastinal silhouette and remainder of the exam is unchanged. Left lung is clear. IMPRESSION: Subtle hazy density over the right mid to lower lung likely related patient's recent bronchoscopy/lung biopsy. No pneumothorax. Electronically Signed   By: Marin Olp M.D.   On: 06/24/2019 10:17   Ct Outside Films Chest  Result Date: 06/30/2019 This examination belongs to an outside facility and is stored here for comparison purposes only.  Contact the originating outside institution for any associated report or interpretation.  Dg C-arm Bronchoscopy  Result Date: 06/24/2019 C-ARM BRONCHOSCOPY: Fluoroscopy was utilized by the requesting physician.  No radiographic interpretation.    Microbiology: Recent Results (from the past 240 hour(s))  Culture, respiratory     Status: None   Collection Time: 06/24/19  9:27 AM   Specimen: Bronchial Alveolar Lavage; Respiratory  Result Value Ref Range Status   Specimen Description BRONCHIAL ALVEOLAR LAVAGE  Final   Special Requests NONE  Final   Gram Stain   Final    RARE WBC PRESENT, PREDOMINANTLY PMN RARE GRAM POSITIVE COCCI IN PAIRS    Culture   Final    Consistent with normal respiratory flora. Performed at [facility], [place] [address] [place], [place] [address]    Report Status 06/26/2019  FINAL  Final  Acid Fast Smear (AFB)     Status: None  Collection Time: 06/24/19  9:27 AM   Specimen: Bronchial Alveolar Lavage; Respiratory  Result Value Ref Range Status   AFB Specimen Processing Concentration  Final   Acid Fast Smear Negative  Final    Comment: (NOTE) Performed At: [facility] [place], [place] [id number] Rush Farmer MD [id number]    Source (AFB) BRONCHIAL ALVEOLAR LAVAGE  Final    Comment: Performed at [facility], [place] [address] [place], [place] [address]  Fungus Culture With Stain     Status: None (Preliminary result)   Collection Time: 06/24/19  9:27 AM  Result Value Ref Range Status   Fungus Stain Final report  Final    Comment: (NOTE) Performed At: [facility] [place], [place] [id number] Rush Farmer MD PY:[id number]    Fungus (Mycology) Culture PENDING  Incomplete   Fungal Source BRONCHIAL ALVEOLAR LAVAGE  Final    Comment: Performed at [facility], [place] [address] [place], [place] [address]  Fungus Culture Result     Status: None   Collection Time: 06/24/19  9:27 AM  Result Value Ref Range Status   Result 1 Comment  Final    Comment: (NOTE) KOH/Calcofluor preparation:  no fungus observed. Performed At: [facility] [place], [place] [id number] Rush Farmer MD [id number]   SARS CORONAVIRUS 2 (TAT 6-24 HRS) Nasopharyngeal Nasopharyngeal Swab     Status: None   Collection Time: 06/30/19  5:16 PM   Specimen: Nasopharyngeal Swab  Result Value Ref Range Status   SARS Coronavirus 2 NEGATIVE NEGATIVE Final    Comment: (NOTE) SARS-CoV-2 target nucleic acids are NOT DETECTED. The SARS-CoV-2 RNA is generally detectable in upper and lower respiratory specimens during the acute phase of infection. Negative results do not preclude SARS-CoV-2 infection, do not rule out co-infections with other pathogens, and should not be used as the sole basis for treatment or  other patient management decisions. Negative results must be combined with clinical observations, patient history, and epidemiological information. The expected result is Negative. Fact Sheet for Patients: SugarRoll.be Fact Sheet for Healthcare Providers: https://www.woods-mathews.com/ This test is not yet approved or cleared by the [place] FDA and  has been authorized for detection and/or diagnosis of SARS-CoV-2 by FDA under an Emergency Use Authorization (EUA). This EUA will remain  in effect (meaning this test can be used) for the duration of the COVID-19 declaration under Section 56 4(b)(1) of the Act, 21 U.S.C. section 360bbb-3(b)(1), unless the authorization is terminated or revoked sooner. Performed at [facility], [place] [address], [place],  [address]      Labs: Basic Metabolic Panel: Recent Labs  Lab 06/30/19 1700 06/30/19 1715 07/01/19 0532  NA  --  141 141  K  --  2.9* 3.6  CL  --  102 108  CO2  --   --  25  GLUCOSE  --  190* 148*  BUN  --  28* 33*  CREATININE  --  2.80* 2.78*  CALCIUM  --   --  9.0  MG 2.0  --   --    Liver Function Tests: No results for input(s): AST, ALT, ALKPHOS, BILITOT, PROT, ALBUMIN in the last 168 hours. No results for input(s): LIPASE, AMYLASE in the last 168 hours. No results for input(s): AMMONIA in the last 168 hours. CBC: Recent Labs  Lab 06/30/19 1700 06/30/19 1715 07/01/19 0532  WBC 5.5  --  5.6  NEUTROABS 3.8  --   --  HGB 11.1* 10.9* 9.6*  HCT 34.6* 32.0* 30.2*  MCV 89.9  --  90.1  PLT 329  --  283   Cardiac Enzymes: No results for input(s): CKTOTAL, CKMB, CKMBINDEX, TROPONINI in the last 168 hours. BNP: BNP (last 3 results) No results for input(s): BNP in the last 8760 hours.  ProBNP (last 3 results) No results for input(s): PROBNP in the last 8760 hours.  CBG: Recent Labs  Lab 07/02/19 1256 07/02/19 1712 07/02/19 2042 07/03/19 0740  07/03/19 1126  GLUCAP 155* 235* 205* 178* 212*       Signed:  Cristal Deer, MD [facility] 07/03/2019, 4:43 PM

## 2019-07-03 NOTE — Progress Notes (Signed)
   07/03/19 1710  What Happened  Was fall witnessed? No  Was patient injured? No  Patient found on floor;in bathroom  Found by Staff-comment (nurse tech)  Stated prior activity bathroom-unassisted  Vitals  Temp (!) 97.5 F (36.4 C)  Temp Source Oral  BP (!) 176/68  MAP (mmHg) 94  BP Location Left Arm  BP Method Automatic  Patient Position (if appropriate) Sitting  Pulse Rate 83  Oxygen Therapy  SpO2 96 %  O2 Device Room Air   Patient found on floor in the bathroom after the nurse tech assisted the pt to the bathroom. Pt was instructed by the tech to pull her emergency cord when finished in the bathroom. The emergency cord was pulled and the tech responded promptly to the call. Pt was then found down in the bathroom by the nurse tech. Pt stated they were trying to clean the toilet when they lost their balance and slid to the floor with their back against the wall. Pt also states that they did not hit their head or injure any other part of their body. Upon assessment all skin is without injury and pt appears to have no visible injuries. VS are stable. Pt was assisted to the bed with assistance from this RN and the nurse tech. MD notified, awaiting response. Post fall bundle completed.

## 2019-07-03 NOTE — Progress Notes (Signed)
SATURATION QUALIFICATIONS: (This note is used to comply with regulatory documentation for home oxygen  Patient Saturations on Room Air at Rest = 91%  Patient Saturations on Room Air while Ambulating = 92%  Patient Saturations on 0 Liters of oxygen while Ambulating = 92%  Please briefly explain why patient needs home oxygen:

## 2019-07-04 ENCOUNTER — Telehealth: Payer: Self-pay | Admitting: *Deleted

## 2019-07-04 DIAGNOSIS — R911 Solitary pulmonary nodule: Secondary | ICD-10-CM | POA: Diagnosis not present

## 2019-07-04 NOTE — Telephone Encounter (Signed)
Called patient on 07/04/2019 , 2:00 PM in an attempt to reach the patient for a hospital follow up.   Admit date: 06/30/19 Discharge: 07/03/19   She does have any questions or concerns about medications from the hospital admission. The patient's medications were reviewed over the phone, they were counseled to bring in all current medications to the hospital follow up visit.   I advised the patient to call if any questions or concerns arise about the hospital admission or medications    Home health was not started in the hospital.  All questions were answered and a follow up appointment was made. Patient has an appointment 07/12/2019 with Garnet Sierras.  Prior to Admission medications   Medication Sig Start Date End Date Taking? Authorizing Provider  acetaminophen (TYLENOL) 500 MG tablet Take 1,000 mg by mouth every 6 (six) hours as needed for mild pain or moderate pain.    [provider]  amLODipine (NORVASC) 10 MG tablet Take 1 tablet (10 mg total) by mouth daily. 07/04/19   Cristal Deer, MD  aspirin EC 81 MG tablet Take 81 mg by mouth daily.    [provider]  atorvastatin (LIPITOR) 40 MG tablet TAKE 1 TABLET DAILY BY MOUTH. Patient taking differently: Take 40 mg by mouth daily.  01/19/19   Vicie Mutters, PA-C  Cholecalciferol (VITAMIN D3) 125 MCG/0.5ML LIQD Take 10,000 Units by mouth daily.    [provider]  Cinnamon 500 MG TABS Take 500 mg by mouth daily.     [provider]  clonazePAM (KLONOPIN) 1 MG tablet Take 1 mg by mouth at bedtime.     [provider]  famotidine (PEPCID) 20 MG tablet Take 1 tablet 2 x /day with meals for Acid Indigestion Patient taking differently: Take 20 mg by mouth 2 (two) times daily. with meals for Acid Indigestion 12/01/18   Unk Pinto, MD  fenofibrate (TRICOR) 145 MG tablet Take 1 tablet Daily for Triglycerides (Blood Fats) Patient taking differently: Take 145 mg by mouth daily. for Triglycerides  (Blood Fats) 03/09/19   Unk Pinto, MD  ferrous sulfate 325 (65 FE) MG EC tablet Two times daily with Vitamin C Patient taking differently: Take 325 mg by mouth daily.  03/21/19   Vicie Mutters, PA-C  FLUoxetine (PROZAC) 40 MG capsule TAKE ONE CAPSULE BY MOUTH EVERY DAY Patient taking differently: Take 40 mg by mouth daily.  10/15/16   Unk Pinto, MD  gabapentin (NEURONTIN) 600 MG tablet Take 1/2- 1 tablet 2 x /day at Breakfast & Bedtime for Diabetic Neuropathy pains Patient taking differently: Take 1,200 mg by mouth 2 (two) times daily. Breakfast & Bedtime for Diabetic Neuropathy pains 11/16/18   Unk Pinto, MD  glipiZIDE (GLUCOTROL) 5 MG tablet Take 1 tablet 3 x  /day with Meals for Diabetes Patient taking differently: Take 5 mg by mouth 3 (three) times daily with meals.  04/16/19   Unk Pinto, MD  glucose blood (ONE TOUCH ULTRA TEST) test strip TEST BLOOD SUGAR 3 TIMES A DAY (DX E11.9) 01/04/18   Unk Pinto, MD  hydrALAZINE (APRESOLINE) 10 MG tablet Take 1 tablet (10 mg total) by mouth 3 (three) times daily. 07/03/19   Cristal Deer, MD  hydrochlorothiazide (MICROZIDE) 12.5 MG capsule Take 2 capsules (25 mg total) by mouth daily. 07/04/19   Cristal Deer, MD  ipratropium (ATROVENT) 0.06 % nasal spray Use 1 to 2 sprays each nostril 2 to 3 x / day as needed Patient taking differently: Place 2 sprays into  both nostrils daily as needed for rhinitis.  11/25/17   Unk Pinto, MD  levothyroxine (SYNTHROID) 100 MCG tablet Take 1 tablet (100 mcg total) by mouth daily. 03/21/19   Vicie Mutters, PA-C  Magnesium 400 MG TABS Take 400 mg by mouth 3 (three) times daily with meals.     [provider]  metFORMIN (GLUCOPHAGE-XR) 500 MG 24 hr tablet Take 2 tablets 2 x /day with Meals for Diabetes 06/28/19   Unk Pinto, MD  Multiple Vitamins-Minerals (MULTIVITAMIN WITH MINERALS) tablet Take 1 tablet by mouth daily.    [provider]  NOVOLIN N 100 UNIT/ML  injection INJECT 50 UNITS EVERY MORNING AND 25 UNITS EVERY EVENING Patient taking differently: Inject 30-50 Units into the skin daily before breakfast. 130 or higher 30 units 200- 50 units 01/25/19   Unk Pinto, MD  ondansetron (ZOFRAN ODT) 8 MG disintegrating tablet Take 1 tablet (8 mg total) by mouth every 8 (eight) hours as needed for nausea or vomiting. 05/15/19   Dorie Rank, MD  potassium chloride (KLOR-CON M10) 10 MEQ tablet Take 1 tablet 3 x /day for Low Potassium 06/24/19   Unk Pinto, MD  vitamin C (ASCORBIC ACID) 500 MG tablet Take 500 mg by mouth daily.    [provider]  Zinc 50 MG CAPS Take 50 mg by mouth daily.     [provider]  losartan (COZAAR) 100 MG tablet Take 1 tablet Daily for BP & Diabetic Kidney Protection 04/09/19 05/15/19  Unk Pinto, MD

## 2019-07-05 ENCOUNTER — Ambulatory Visit: Payer: PPO

## 2019-07-06 ENCOUNTER — Institutional Professional Consult (permissible substitution): Payer: PPO | Admitting: Radiation Oncology

## 2019-07-07 ENCOUNTER — Ambulatory Visit: Payer: PPO | Admitting: Radiation Oncology

## 2019-07-08 ENCOUNTER — Ambulatory Visit: Payer: PPO

## 2019-07-11 ENCOUNTER — Ambulatory Visit: Payer: PPO | Admitting: Radiation Oncology

## 2019-07-11 NOTE — Progress Notes (Signed)
NO SHOW / CANCEL   This note is not being shared with the patient for the following reason: To respect privacy (The patient or proxy has requested that the information not be shared).

## 2019-07-12 ENCOUNTER — Ambulatory Visit: Payer: PPO | Admitting: Adult Health Nurse Practitioner

## 2019-07-12 ENCOUNTER — Ambulatory Visit: Payer: PPO

## 2019-07-12 ENCOUNTER — Ambulatory Visit (INDEPENDENT_AMBULATORY_CARE_PROVIDER_SITE_OTHER): Payer: PPO | Admitting: Adult Health Nurse Practitioner

## 2019-07-12 DIAGNOSIS — Z5329 Procedure and treatment not carried out because of patient's decision for other reasons: Secondary | ICD-10-CM

## 2019-07-13 ENCOUNTER — Telehealth: Payer: Self-pay | Admitting: Pulmonary Disease

## 2019-07-13 ENCOUNTER — Ambulatory Visit: Payer: PPO | Admitting: Radiation Oncology

## 2019-07-13 NOTE — Telephone Encounter (Signed)
PCCM:  Results of Percepta genomic sequencing classifier which was obtained via brush specimens during bronchoscopy.  Increased positive predictive value of malignancy to 91% based on RNA at whole transcript do not sequencing for risk of primary lung cancer.  Full report will be scanned into epic.  Garner Nash, DO [facility] 07/13/2019 12:53 PM

## 2019-07-16 ENCOUNTER — Other Ambulatory Visit: Payer: Self-pay | Admitting: Internal Medicine

## 2019-07-16 DIAGNOSIS — E876 Hypokalemia: Secondary | ICD-10-CM

## 2019-07-16 DIAGNOSIS — Z79899 Other long term (current) drug therapy: Secondary | ICD-10-CM

## 2019-07-20 ENCOUNTER — Other Ambulatory Visit: Payer: Self-pay

## 2019-07-20 ENCOUNTER — Ambulatory Visit (INDEPENDENT_AMBULATORY_CARE_PROVIDER_SITE_OTHER): Payer: PPO | Admitting: Pulmonary Disease

## 2019-07-20 ENCOUNTER — Encounter: Payer: Self-pay | Admitting: Pulmonary Disease

## 2019-07-20 DIAGNOSIS — J95811 Postprocedural pneumothorax: Secondary | ICD-10-CM

## 2019-07-20 DIAGNOSIS — R911 Solitary pulmonary nodule: Secondary | ICD-10-CM

## 2019-07-20 NOTE — Patient Instructions (Signed)
Thank you for visiting Dr. Valeta Harms at [facility]. Today we recommend the following:  Return if symptoms worsen or fail to improve, for with APP or Dr. Valeta Harms.    Please do your part to reduce the spread of COVID-19.

## 2019-07-20 NOTE — Progress Notes (Signed)
Virtual Visit via Telephone Note  I connected with Jayma Volpi Conklin on 07/20/19 at  2:00 PM EST by telephone and verified that I am speaking with the correct person using two identifiers.  Location: Patient: [name]  Provider: Garner Nash, DO    I discussed the limitations, risks, security and privacy concerns of performing an evaluation and management service by telephone and the availability of in person appointments. I also discussed with the patient that there may be a patient responsible charge related to this service. The patient expressed understanding and agreed to proceed.  History of Present Illness:  OV 06/16/2019: This is a 73 year old female with a past medical history of CVA, bipolar disorder, gait disturbance, history of frequent falls, has difficulty with ambulation, spends most of her time sedentary.  Per husband has had a significant decline in her functional status over the past several months.  Unfortunately he was found to have a lung nodule on CT imaging and subsequently PET scan imaging on June 14, 2019 which revealed hypermetabolism within a 1.5 cm right lower lobe pulmonary nodule with a max SUV of 2.8 concerning for a stage T1b N0 M0 stage Ia lung cancer.  Patient is a longstanding history of smoking for the past 50+ years.  Smoking at her highest 2 packs/day.  Now only smoking approximately 1 pack/day.  Otherwise she is very anxious about her recent diagnosis.  Denies weight loss denies fevers chills or hemoptysis.  Telephone visit: 07/20/2019: Called and spoke with the patient today regarding her recent hospitalization.  She had a delayed pneumothorax following navigational bronchoscopy she was several days out from procedure but developed shortness of breath and presented to the radiation oncology department for CT planning for SBRT which revealed a new right-sided pneumothorax.  She was treated conservatively with oxygen therapy.  And she was successfully  discharged home.  Talking to her today she otherwise is doing well.  She has no complaints.  She is anxious about when she may receive radiation treatments.  She did let me know that the markers they had placed on her skin from planning have washed off.  I explained I would reach out to radiation oncology and let her them know that she is home.  We also reviewed her [place].  This revealed a greater than 90% positive predictive value for malignancy based on genetic testing from the right mainstem bronchial brushings.  Observations/Objective: Nonlabored breathing able to complete full sentences.  Assessment and Plan: Right lower lobe lung nodule Secondary pneumothorax Status post navigational bronchoscopy with fiducial placement  Plan: Follow-up to be scheduled with radiation oncology.  I will send Dr. Clabe Seal office message.  Follow Up Instructions:  Patient to follow-up as needed   I discussed the assessment and treatment plan with the patient. The patient was provided an opportunity to ask questions and all were answered. The patient agreed with the plan and demonstrated an understanding of the instructions.   The patient was advised to call back or seek an in-person evaluation if the symptoms worsen or if the condition fails to improve as anticipated.  I provided 17 minutes of non-face-to-face time during this encounter.   Garner Nash, DO

## 2019-07-26 ENCOUNTER — Ambulatory Visit: Payer: Self-pay | Admitting: Adult Health Nurse Practitioner

## 2019-08-02 ENCOUNTER — Other Ambulatory Visit: Payer: Self-pay | Admitting: Internal Medicine

## 2019-08-02 ENCOUNTER — Other Ambulatory Visit: Payer: Self-pay | Admitting: Physician Assistant

## 2019-08-04 ENCOUNTER — Other Ambulatory Visit: Payer: Self-pay

## 2019-08-04 ENCOUNTER — Ambulatory Visit
Admission: RE | Admit: 2019-08-04 | Discharge: 2019-08-04 | Disposition: A | Discharge status: Home / Self Care | Payer: PPO | Source: Ambulatory Visit | Attending: Radiation Oncology | Admitting: Radiation Oncology

## 2019-08-04 DIAGNOSIS — Z510 Encounter for antineoplastic radiation therapy: Secondary | ICD-10-CM | POA: Diagnosis not present

## 2019-08-04 DIAGNOSIS — R911 Solitary pulmonary nodule: Secondary | ICD-10-CM | POA: Diagnosis not present

## 2019-08-09 ENCOUNTER — Encounter: Payer: Self-pay | Admitting: Adult Health

## 2019-08-09 ENCOUNTER — Ambulatory Visit (INDEPENDENT_AMBULATORY_CARE_PROVIDER_SITE_OTHER): Payer: PPO | Admitting: Adult Health

## 2019-08-09 ENCOUNTER — Other Ambulatory Visit: Payer: Self-pay

## 2019-08-09 VITALS — BP 140/80 | HR 89 | Temp 97.5°F | Wt 172.0 lb

## 2019-08-09 DIAGNOSIS — N183 Chronic kidney disease, stage 3 unspecified: Secondary | ICD-10-CM | POA: Diagnosis not present

## 2019-08-09 DIAGNOSIS — E1122 Type 2 diabetes mellitus with diabetic chronic kidney disease: Secondary | ICD-10-CM | POA: Diagnosis not present

## 2019-08-09 DIAGNOSIS — E1142 Type 2 diabetes mellitus with diabetic polyneuropathy: Secondary | ICD-10-CM | POA: Diagnosis not present

## 2019-08-09 DIAGNOSIS — Z9114 Patient's other noncompliance with medication regimen: Secondary | ICD-10-CM

## 2019-08-09 DIAGNOSIS — N184 Chronic kidney disease, stage 4 (severe): Secondary | ICD-10-CM

## 2019-08-09 DIAGNOSIS — D649 Anemia, unspecified: Secondary | ICD-10-CM

## 2019-08-09 DIAGNOSIS — R911 Solitary pulmonary nodule: Secondary | ICD-10-CM | POA: Diagnosis not present

## 2019-08-09 DIAGNOSIS — N289 Disorder of kidney and ureter, unspecified: Secondary | ICD-10-CM

## 2019-08-09 DIAGNOSIS — Z91148 Patient's other noncompliance with medication regimen for other reason: Secondary | ICD-10-CM

## 2019-08-09 DIAGNOSIS — I10 Essential (primary) hypertension: Secondary | ICD-10-CM

## 2019-08-09 DIAGNOSIS — Z510 Encounter for antineoplastic radiation therapy: Secondary | ICD-10-CM | POA: Diagnosis not present

## 2019-08-09 DIAGNOSIS — Z79899 Other long term (current) drug therapy: Secondary | ICD-10-CM

## 2019-08-09 MED ORDER — FERROUS SULFATE 325 (65 FE) MG PO TBEC: DELAYED_RELEASE_TABLET | ORAL | Status: DC

## 2019-08-09 MED ORDER — GABAPENTIN 600 MG PO TABS: ORAL_TABLET | ORAL | 3 refills | Status: DC

## 2019-08-09 NOTE — Progress Notes (Signed)
Assessment and Plan:  Corynn was seen today for medication management.  Diagnoses and all orders for this visit:  Poor compliance with medication Reviewed all medications at length today Encouraged her to switch from BID dosing pill box to TID dosing pill box Options reviewed with patient and husband  Close follow up in 2-3 weeks; bring pill box and all medications at that time Sooner if glucose trending up with cessation of metformin   Anemia, unspecified type Taking iron supplement with vitamin C; no recent check, will add on iron panel  Normocytic anemia last 6 months corresponding with declining renal functions Normal B12 since onset Most likely this is anemia of chronic disease r/t renal functions Check iron and will discontinue iron supplement if WNL -     Adding on Iron/TIBC/ferritin panel  -     CBC with Diff  Essential hypertension Continue medications Monitor blood pressure at home; call if consistently over 130/80 Continue DASH diet.   Reminder to go to the ER if any CP, SOB, nausea, dizziness, severe HA, changes vision/speech, left arm numbness and tingling and jaw pain. -     COMPLETE METABOLIC PANEL WITH GFR -     Magnesium  CKD stage 4 due to type 2 diabetes mellitus (HCC)/Deterioration in renal function STOP metformin, decrease gabapentin per instruction Will have her follow up with [place] [facility] Dr. Christie Nottingham urgently -     COMPLETE METABOLIC PANEL WITH GFR -     Magnesium  Diabetic polyneuropathy associated with type 2 diabetes mellitus (HCC) Cut back from 1200 mg BID to 600 mg BID x 1 week then 300 mg BID -     gabapentin (NEURONTIN) 600 MG tablet; Take 1/2 tablet 2 x /day at Breakfast & Bedtime for Diabetic Neuropathy pains   KEEP CLOSE FOLLOW UP IN JAN 2021  Further disposition pending results of labs. Discussed med's effects and SE's.   Over 30 minutes of exam, counseling, chart review, and critical decision making was performed.   Future Appointments   Date Time Provider [place]  08/15/2019  1:15 PM Gery Pray, MD [facility] None  08/17/2019  2:30 PM Gery Pray, MD [facility] None  08/22/2019  1:45 PM Gery Pray, MD [facility] None  09/06/2019 11:15 AM Garnet Sierras, NP [facility] None  11/29/2019  2:30 PM Unk Pinto, MD [facility] None  03/06/2020  3:00 PM Garnet Sierras, NP [facility] None  06/19/2020  3:00 PM Unk Pinto, MD [facility] None    ------------------------------------------------------------------------------------------------------------------   HPI BP 140/80   Pulse 89   Temp (!) 97.5 F (36.4 C)   Wt 172 lb (78 kg)   SpO2 94%   BMI 30.47 kg/m   73 y.o.female with bipolar depression (managed by Dr. [name]), ongoing memory difficulty, poorly controlled T2DM with CKD, recently diagnosed with RLL lung cancer (followed by pulm/onc, will be undergoing radiation therapy), presents for medication management due to problems with medication compliance. Accompanied by her husband.   Reviewed all medications with patient at length today. Poor insight in medications and frequently contradicts previous statements of how she is taking meds.   It came to my attention that this patient has declining renal functions, has progressed from CKD III to IV in the past year with last GFR 16 but remains on metformin in addition to glipizide 5 mg TID with meals and novolin N variable dose. She was not aware of CKD 4 status.    She has not been working on diet and exercise for T2  diabetes with CKD 4, diaberic retinopathy, neuropathy, and denies foot ulcerations, hypoglycemia , increased appetite, polydipsia, polyuria, visual disturbances and vomiting. Last A1C in the office was:  Lab Results  Component Value Date   HGBA1C 7.5 (H) 07/01/2019    She also reports taking gabapentin 1200 mg BID for neuropathy.  Losartan was apparently recently discontinued and transitioned to amlodipine.  She is aware to avoid  NSAIDs and denies taking this.  Does admit to poor water intake, admits to 4+ dark sodas daily.  She has seen Dr. Everardo Pacific at [place] in 07/2018, was apparently advised 6 month follow up but never did so due to pandemic.    Lab Results  Component Value Date   GFRNONAA 16 (L) 07/01/2019   GFRNONAA 19 (L) 06/24/2019   GFRNONAA 27 (L) 05/24/2019   Lab Results  Component Value Date   CREATININE 2.78 (H) 07/01/2019   CREATININE 2.80 (H) 06/30/2019   CREATININE 2.43 (H) 06/24/2019    She has hemoglobin trending down, intermittent normocytic anemia ongoing since 02/2019 on review:  CBC Latest Ref Rng & Units 07/01/2019 06/30/2019 06/30/2019  WBC 4.0 - 10.5 K/uL 5.6 - 5.5  Hemoglobin 12.0 - 15.0 g/dL 9.6(L) 10.9(L) 11.1(L)  Hematocrit 36.0 - 46.0 % 30.2(L) 32.0(L) 34.6(L)  Platelets 150 - 400 K/uL 283 - 329   She is on an iron supplement BID with vitamin C though no recent checks from what I can tell, more likely she has anemia of chronic disease related to CKD.  Lab Results  Component Value Date   IRON 49 08/29/2015   TIBC 284 08/29/2015   She does have documented history of B12 def anemia though recent check since onset of anemia was WNL:  Lab Results  Component Value Date   [id number] 03/10/2019   No results found for: RETICCTPCT    Past Medical History:  Diagnosis Date  . Anxiety disorder   . Arthritis LOWER BACK  . Asthma   . Bipolar 1 disorder ([place])   . Carpal tunnel syndrome   . Chronic kidney disease    stage 3  . Depression   . Diabetes mellitus    type 2  . Dyslipidemia   . Dyspnea    with much activity  . Gait disorder   . GERD (gastroesophageal reflux disease)   . Gout LEFT FOOT-  STABLE  . H/O hiatal hernia   . History of CVA (cerebrovascular accident) FOUND PER MRI 1994--  RESIDUAL MEMORY IMPAIRED  . Hypercholesteremia   . Hypertension   . Memory difficulty 08/14/2016  . Neuropathy of lower extremity   . OCD (obsessive  compulsive disorder)   . Peripheral neuropathy   . SOB (shortness of breath) on exertion   . Stroke ([place])    2000 memory loss   . SUI (stress urinary incontinence, female)   . Vulva cancer ([place]) 07/07/2012  . Vulvar lesion      Allergies  Allergen Reactions  . Sulfa Antibiotics Other (See Comments)    Pt states she had "extreme pain"  . Sulfacetamide Sodium Other (See Comments)    Pain    Current Outpatient Medications on File Prior to Visit  Medication Sig  . amLODipine (NORVASC) 10 MG tablet Take 1 tablet (10 mg total) by mouth daily.  Marland Kitchen aspirin EC 81 MG tablet Take 81 mg by mouth daily.  Marland Kitchen atorvastatin (LIPITOR) 40 MG tablet Take 1 tablet Daily for Cholesterol  . Cholecalciferol (VITAMIN D3) 25 MCG (  1000 UT) CAPS Take by mouth.  . Cinnamon 500 MG TABS Take 500 mg by mouth daily.   . clonazePAM (KLONOPIN) 1 MG tablet Take 1 mg by mouth at bedtime.   . famotidine (PEPCID) 20 MG tablet Take 1 tablet 2 x /day with meals for Acid Indigestion (Patient taking differently: Take 20 mg by mouth 2 (two) times daily. with meals for Acid Indigestion)  . fenofibrate (TRICOR) 145 MG tablet Take 1 tablet Daily for Triglycerides (Blood Fats) (Patient taking differently: Take 145 mg by mouth daily. for Triglycerides (Blood Fats))  . FLUoxetine (PROZAC) 40 MG capsule TAKE ONE CAPSULE BY MOUTH EVERY DAY (Patient taking differently: Take 40 mg by mouth daily. )  . glipiZIDE (GLUCOTROL) 5 MG tablet Take 1 tablet 3 x  /day with Meals for Diabetes (Patient taking differently: Take 5 mg by mouth 3 (three) times daily with meals. )  . hydrALAZINE (APRESOLINE) 10 MG tablet Take 1 tablet (10 mg total) by mouth 3 (three) times daily.  . hydrochlorothiazide (MICROZIDE) 12.5 MG capsule Take 2 capsules (25 mg total) by mouth daily.  Marland Kitchen levothyroxine (SYNTHROID) 100 MCG tablet Take 1 tablet (100 mcg total) by mouth daily.  . Magnesium 400 MG TABS Take 400 mg by mouth 3 (three) times daily with meals.   . Multiple  Vitamins-Minerals (MULTIVITAMIN WITH MINERALS) tablet Take 1 tablet by mouth daily.  Marland Kitchen NOVOLIN N 100 UNIT/ML injection INJECT 50 UNITS EVERY MORNING AND 25 UNITS EVERY EVENING (Patient taking differently: Inject 30-50 Units into the skin daily before breakfast. 130 or higher 30 units 200- 50 units)  . ondansetron (ZOFRAN ODT) 8 MG disintegrating tablet Take 1 tablet (8 mg total) by mouth every 8 (eight) hours as needed for nausea or vomiting.  Glory Rosebush ULTRA test strip TEST BLOOD SUGAR 3 TIMES A DAY  . potassium chloride (KLOR-CON M10) 10 MEQ tablet Take 1 tablet 3 x /day with Meals for Potassium  . vitamin C (ASCORBIC ACID) 500 MG tablet Take 500 mg by mouth daily. Take with iron/ferrous sulfate  . Zinc 50 MG CAPS Take 50 mg by mouth daily.   Marland Kitchen acetaminophen (TYLENOL) 500 MG tablet Take 1,000 mg by mouth every 6 (six) hours as needed for mild pain or moderate pain.  . [DISCONTINUED] losartan (COZAAR) 100 MG tablet Take 1 tablet Daily for BP & Diabetic Kidney Protection   No current facility-administered medications on file prior to visit.    ROS: all negative except above.   Physical Exam:  BP 140/80   Pulse 89   Temp (!) 97.5 F (36.4 C)   Wt 172 lb (78 kg)   SpO2 94%   BMI 30.47 kg/m   General Appearance: Well nourished, well dressed elderly female in no apparent distress. Eyes: PERRLA, EOMs, conjunctiva no swelling or erythema ENT/Mouth: Mask in place; oral exam deferred. Hearing normal.  Neck: Supple Respiratory: Respiratory effort normal, BS equal bilaterally without rales, rhonchi, wheezing or stridor.  Cardio: RRR with no MRGs. Intact peripheral pulses with bilateral ankle and pedal edema.  Abdomen: Soft, + BS.  Non tender, no guarding, rebound, hernias, masses. Lymphatics: Non tender without lymphadenopathy.  Musculoskeletal: No obvious deformity; in wheelchair, gait not assessed Skin: Warm, dry without rashes, lesions, ecchymosis.  Neuro:  Normal muscle tone,  Sensation intact.  Psych: Awake and oriented X 3, normal affect, wandering through process, Insight and Judgment is poor    Izora Ribas, NP 5:47 PM [facility]

## 2019-08-09 NOTE — Patient Instructions (Addendum)
Please pick up a 3 times a day pill sorter for medications - bring ALL of your supplements and meds with you next visit  I see no reason why you cannot take tylenol - please call the cancer doctor to clarify whether tylenol is ok to take  STOP metformin - keep a close eye on sugars at home (check at least twice a day - before breakfast and dinner and write them down   Call if your blood sugars are shooting up (150+ in the morning, 200+ before dinner)  Please cut back on gabapentin - 1/2 tab (300 mg) twice daily   Take iron supplement (ferrous sulfate) twice a day WITH vitamin C      Chronic Kidney Disease, Adult Chronic kidney disease (CKD) happens when the kidneys are damaged over a long period of time. The kidneys are two organs that help with:  Getting rid of waste and extra fluid from the blood.  Making hormones that maintain the amount of fluid in your tissues and blood vessels.  Making sure that the body has the right amount of fluids and chemicals. Most of the time, CKD does not go away, but it can usually be controlled. Steps must be taken to slow down the kidney damage or to stop it from getting worse. If this is not done, the kidneys may stop working. Follow these instructions at home: Medicines  Take over-the-counter and prescription medicines only as told by your doctor. You may need to change the amount of medicines you take.  Do not take any new medicines unless your doctor says it is okay. Many medicines can make your kidney damage worse.  Do not take any vitamin and supplements unless your doctor says it is okay. Many vitamins and supplements can make your kidney damage worse. General instructions  Follow a diet as told by your doctor. You may need to stay away from: ? Alcohol. ? Salty foods. ? Foods that are high in:  Potassium.  Calcium.  Protein.  Do not use any products that contain nicotine or tobacco, such as cigarettes and e-cigarettes. If you need  help quitting, ask your doctor.  Keep track of your blood pressure at home. Tell your doctor about any changes.  If you have diabetes, keep track of your blood sugar as told by your doctor.  Try to stay at a healthy weight. If you need help, ask your doctor.  Exercise at least 30 minutes a day, 5 days a week.  Stay up-to-date with your shots (immunizations) as told by your doctor.  Keep all follow-up visits as told by your doctor. This is important. Contact a doctor if:  Your symptoms get worse.  You have new symptoms. Get help right away if:  You have symptoms of end-stage kidney disease. These may include: ? Headaches. ? Numbness in your hands or feet. ? Easy bruising. ? Having hiccups often. ? Chest pain. ? Shortness of breath. ? Stopping of menstrual periods in women.  You have a fever.  You have very little pee (urine).  You have pain or bleeding when you pee. Summary  Chronic kidney disease (CKD) happens when the kidneys are damaged over a long period of time.  Most of the time, this condition does not go away, but it can usually be controlled. Steps must be taken to slow down the kidney damage or to stop it from getting worse.  Treatment may include a combination of medicines and lifestyle changes. This information is not  intended to replace advice given to you by your health care provider. Make sure you discuss any questions you have with your health care provider. Document Released: 10/29/2009 Document Revised: 07/17/2017 Document Reviewed: 09/08/2016 Elsevier Patient Education  2020 [employer].

## 2019-08-09 NOTE — Progress Notes (Deleted)
Assessment and Plan:  Serenitie was seen today for medication management.  Diagnoses and all orders for this visit:  Poor compliance with medication Reviewed all medications at length today Encouraged her to switch from BID dosing pill box to TID dosing pill box Options reviewed with patient and husband  Close follow up in 2-3 weeks; bring pill box and all medications at that time  Anemia, unspecified type Taking iron supplement with vitamin C; no recent check, will add on iron panel  Normocytic anemia last 6 months corresponding with declining renal functions Normal B12 since onset Most likely this is anemia of chronic disease r/t renal functions Check iron and will discontinue iron supplement if WNL -     Adding on Iron/TIBC/ferritin panel  -     CBC with Diff  Essential hypertension Continue medication: Monitor blood pressure at home; call if consistently over 130/80 Continue DASH diet.   Reminder to go to the ER if any CP, SOB, nausea, dizziness, severe HA, changes vision/speech, left arm numbness and tingling and jaw pain. -     COMPLETE METABOLIC PANEL WITH GFR -     Magnesium  CKD stage 4 due to type 2 diabetes mellitus (HCC)/Deterioration in renal function STOP metformin, decrease gabapentin per instruction Will have her follow up with [facility] -     COMPLETE METABOLIC PANEL WITH GFR -     Magnesium  Diabetic polyneuropathy associated with type 2 diabetes mellitus (HCC) Cut back from 1200 mg BID to 600 mg BID x 1 week then 300 mg BID -     gabapentin (NEURONTIN) 600 MG tablet; Take 1/2 tablet 2 x /day at Breakfast & Bedtime for Diabetic Neuropathy pains   Further disposition pending results of labs. Discussed med's effects and SE's.   Over 30 minutes of exam, counseling, chart review, and critical decision making was performed.   Future Appointments  Date Time Provider [place]  08/15/2019  1:15 PM Gery Pray, MD [facility] None  08/17/2019  2:30  PM Gery Pray, MD [facility] None  08/22/2019  1:45 PM Gery Pray, MD [facility] None  09/06/2019 11:15 AM Garnet Sierras, NP [facility] None  11/29/2019  2:30 PM Unk Pinto, MD [facility] None  03/06/2020  3:00 PM Garnet Sierras, NP [facility] None  06/19/2020  3:00 PM Unk Pinto, MD [facility] None    ------------------------------------------------------------------------------------------------------------------   HPI BP 140/80   Pulse 89   Temp (!) 97.5 F (36.4 C)   Wt 172 lb (78 kg)   SpO2 94%   BMI 30.47 kg/m   73 y.o.female with bipolar depression (managed by Dr. [name]), ongoing memory difficulty, poorly controlled T2DM with CKD, recently diagnosed with RLL lung cancer (followed by pulm/onc, will be undergoing radiation therapy), presents for medication management due to problems with medication compliance.   Reviewed all medications with patient at length today.   It came to my attention that this patient has declining renal functions, has progressed from CKD III to IV in the past year with last GFR 16 but remains on metformin in addition to glipizide 5 mg TID with meals and novolin N variable dose.    She has not been working on diet and exercise for T2 diabetes with CKD 4, diaberic retinopathy, neuropathy, and denies foot ulcerations, hypoglycemia , increased appetite, polydipsia, polyuria, visual disturbances and vomiting. Last A1C in the office was:  Lab Results  Component Value Date   HGBA1C 7.5 (H) 07/01/2019    She also reports taking gabapentin  1200 mg BID for neuropathy.  Losartan was apparently recently discontinued and transitioned to amlodipine.  She is aware to avoid NSAIDs and denies taking this.  Does admit to poor water intake, admits to 4+ dark sodas daily.  She has seen Dr. Everardo Pacific at [facility] in 07/2018, was apparently advised 6 month follow up but never did so due to pandemic.    Lab Results  Component  Value Date   GFRNONAA 16 (L) 07/01/2019   GFRNONAA 19 (L) 06/24/2019   GFRNONAA 27 (L) 05/24/2019   Lab Results  Component Value Date   CREATININE 2.78 (H) 07/01/2019   CREATININE 2.80 (H) 06/30/2019   CREATININE 2.43 (H) 06/24/2019    She has hemoglobin trending down, intermittent normocytic anemia ongoing since 02/2019 on review:  CBC Latest Ref Rng & Units 07/01/2019 06/30/2019 06/30/2019  WBC 4.0 - 10.5 K/uL 5.6 - 5.5  Hemoglobin 12.0 - 15.0 g/dL 9.6(L) 10.9(L) 11.1(L)  Hematocrit 36.0 - 46.0 % 30.2(L) 32.0(L) 34.6(L)  Platelets 150 - 400 K/uL 283 - 329   She is on an iron supplement BID with vitamin C though no recent checks from what I can tell, more likely she has anemia of chronic disease related to CKD.  Lab Results  Component Value Date   IRON 49 08/29/2015   TIBC 284 08/29/2015   She does have documented history of B12 def anemia though recent check since onset of anemia was WNL:  Lab Results  Component Value Date   [id number] 299 03/10/2019   No results found for: RETICCTPCT    Past Medical History:  Diagnosis Date  . Anxiety disorder   . Arthritis LOWER BACK  . Asthma   . Bipolar 1 disorder ([place])   . Carpal tunnel syndrome   . Chronic kidney disease    stage 3  . Depression   . Diabetes mellitus    type 2  . Dyslipidemia   . Dyspnea    with much activity  . Gait disorder   . GERD (gastroesophageal reflux disease)   . Gout LEFT FOOT-  STABLE  . H/O hiatal hernia   . History of CVA (cerebrovascular accident) FOUND PER MRI 1994--  RESIDUAL MEMORY IMPAIRED  . Hypercholesteremia   . Hypertension   . Memory difficulty 08/14/2016  . Neuropathy of lower extremity   . OCD (obsessive compulsive disorder)   . Peripheral neuropathy   . SOB (shortness of breath) on exertion   . Stroke ([place])    2000 memory loss   . SUI (stress urinary incontinence, female)   . 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 Vulvar lesion      Allergies  Allergen Reactions  . Sulfa  Antibiotics Other (See Comments)    Pt states she had "extreme pain"  . Sulfacetamide Sodium Other (See Comments)    Pain    Current Outpatient Medications on File Prior to Visit  Medication Sig  . amLODipine (NORVASC) 10 MG tablet Take 1 tablet (10 mg total) by mouth daily.  Marland Kitchen aspirin EC 81 MG tablet Take 81 mg by mouth daily.  Marland Kitchen atorvastatin (LIPITOR) 40 MG tablet Take 1 tablet Daily for Cholesterol  . Cholecalciferol (VITAMIN D3) 25 MCG (1000 UT) CAPS Take by mouth.  . Cinnamon 500 MG TABS Take 500 mg by mouth daily.   . clonazePAM (KLONOPIN) 1 MG tablet Take 1 mg by mouth at bedtime.   . famotidine (PEPCID) 20 MG tablet Take 1 tablet 2 x /day with  meals for Acid Indigestion (Patient taking differently: Take 20 mg by mouth 2 (two) times daily. with meals for Acid Indigestion)  . fenofibrate (TRICOR) 145 MG tablet Take 1 tablet Daily for Triglycerides (Blood Fats) (Patient taking differently: Take 145 mg by mouth daily. for Triglycerides (Blood Fats))  . FLUoxetine (PROZAC) 40 MG capsule TAKE ONE CAPSULE BY MOUTH EVERY DAY (Patient taking differently: Take 40 mg by mouth daily. )  . glipiZIDE (GLUCOTROL) 5 MG tablet Take 1 tablet 3 x  /day with Meals for Diabetes (Patient taking differently: Take 5 mg by mouth 3 (three) times daily with meals. )  . hydrALAZINE (APRESOLINE) 10 MG tablet Take 1 tablet (10 mg total) by mouth 3 (three) times daily.  . hydrochlorothiazide (MICROZIDE) 12.5 MG capsule Take 2 capsules (25 mg total) by mouth daily.  Marland Kitchen levothyroxine (SYNTHROID) 100 MCG tablet Take 1 tablet (100 mcg total) by mouth daily.  . Magnesium 400 MG TABS Take 400 mg by mouth 3 (three) times daily with meals.   . Multiple Vitamins-Minerals (MULTIVITAMIN WITH MINERALS) tablet Take 1 tablet by mouth daily.  Marland Kitchen NOVOLIN N 100 UNIT/ML injection INJECT 50 UNITS EVERY MORNING AND 25 UNITS EVERY EVENING (Patient taking differently: Inject 30-50 Units into the skin daily before breakfast. 130 or higher  30 units 200- 50 units)  . ondansetron (ZOFRAN ODT) 8 MG disintegrating tablet Take 1 tablet (8 mg total) by mouth every 8 (eight) hours as needed for nausea or vomiting.  Glory Rosebush ULTRA test strip TEST BLOOD SUGAR 3 TIMES A DAY  . potassium chloride (KLOR-CON M10) 10 MEQ tablet Take 1 tablet 3 x /day with Meals for Potassium  . vitamin C (ASCORBIC ACID) 500 MG tablet Take 500 mg by mouth daily. Take with iron/ferrous sulfate  . Zinc 50 MG CAPS Take 50 mg by mouth daily.   Marland Kitchen acetaminophen (TYLENOL) 500 MG tablet Take 1,000 mg by mouth every 6 (six) hours as needed for mild pain or moderate pain.  . [DISCONTINUED] losartan (COZAAR) 100 MG tablet Take 1 tablet Daily for BP & Diabetic Kidney Protection   No current facility-administered medications on file prior to visit.    ROS: all negative except above.   Physical Exam:  BP 140/80   Pulse 89   Temp (!) 97.5 F (36.4 C)   Wt 172 lb (78 kg)   SpO2 94%   BMI 30.47 kg/m   General Appearance: Well nourished, well dressed elderly female in no apparent distress. Eyes: PERRLA, EOMs, conjunctiva no swelling or erythema ENT/Mouth: Mask in place; oral exam deferred. Hearing normal.  Neck: Supple Respiratory: Respiratory effort normal, BS equal bilaterally without rales, rhonchi, wheezing or stridor.  Cardio: RRR with no MRGs. Intact peripheral pulses with bilateral ankle and pedal edema.  Abdomen: Soft, + BS.  Non tender, no guarding, rebound, hernias, masses. Lymphatics: Non tender without lymphadenopathy.  Musculoskeletal: No obvious deformity; in wheelchair, gait not assessed Skin: Warm, dry without rashes, lesions, ecchymosis.  Neuro:  Normal muscle tone, Sensation intact.  Psych: Awake and oriented X 3, normal affect, wandering through process, Insight and Judgment is poor    Izora Ribas, NP 5:47 PM [facility]

## 2019-08-10 ENCOUNTER — Telehealth: Payer: Self-pay | Admitting: Adult Health

## 2019-08-10 NOTE — Telephone Encounter (Signed)
Per Liane Comber faxed update on patient to patient's  nephrologist, Dr [name] @ [facility], requesting a follow up with Dr [name] or associate. Requested appointment date and time for close follow up.

## 2019-08-11 LAB — CBC WITH DIFFERENTIAL/PLATELET
Absolute Monocytes: 490 cells/uL (ref 200–950)
Basophils Absolute: 31 cells/uL (ref 0–200)
Basophils Relative: 0.5 %
Eosinophils Absolute: 99 cells/uL (ref 15–500)
Eosinophils Relative: 1.6 %
HCT: 30.6 % — ABNORMAL LOW (ref 35.0–45.0)
Hemoglobin: 9.9 g/dL — ABNORMAL LOW (ref 11.7–15.5)
Lymphs Abs: 1104 cells/uL (ref 850–3900)
MCH: 28.4 pg (ref 27.0–33.0)
MCHC: 32.4 g/dL (ref 32.0–36.0)
MCV: 87.7 fL (ref 80.0–100.0)
MPV: 10.2 fL (ref 7.5–12.5)
Monocytes Relative: 7.9 %
Neutro Abs: 4476 cells/uL (ref 1500–7800)
Neutrophils Relative %: 72.2 %
Platelets: 295 10*3/uL (ref 140–400)
RBC: 3.49 10*6/uL — ABNORMAL LOW (ref 3.80–5.10)
RDW: 13.6 % (ref 11.0–15.0)
Total Lymphocyte: 17.8 %
WBC: 6.2 10*3/uL (ref 3.8–10.8)

## 2019-08-11 LAB — COMPLETE METABOLIC PANEL WITH GFR
AG Ratio: 1.2 (calc) (ref 1.0–2.5)
ALT: 30 U/L — ABNORMAL HIGH (ref 6–29)
AST: 23 U/L (ref 10–35)
Albumin: 3.2 g/dL — ABNORMAL LOW (ref 3.6–5.1)
Alkaline phosphatase (APISO): 66 U/L (ref 37–153)
BUN/Creatinine Ratio: 13 (calc) (ref 6–22)
BUN: 34 mg/dL — ABNORMAL HIGH (ref 7–25)
CO2: 22 mmol/L (ref 20–32)
Calcium: 9.2 mg/dL (ref 8.6–10.4)
Chloride: 102 mmol/L (ref 98–110)
Creat: 2.61 mg/dL — ABNORMAL HIGH (ref 0.60–0.93)
GFR, Est African American: 20 mL/min/{1.73_m2} — ABNORMAL LOW (ref >=60)
GFR, Est Non African American: 18 mL/min/{1.73_m2} — ABNORMAL LOW (ref >=60)
Globulin: 2.6 g/dL (calc) (ref 1.9–3.7)
Glucose, Bld: 213 mg/dL — ABNORMAL HIGH (ref 65–99)
Potassium: 4.4 mmol/L (ref 3.5–5.3)
Sodium: 136 mmol/L (ref 135–146)
Total Bilirubin: 0.2 mg/dL (ref 0.2–1.2)
Total Protein: 5.8 g/dL — ABNORMAL LOW (ref 6.1–8.1)

## 2019-08-11 LAB — IRON,TIBC AND FERRITIN PANEL
%SAT: 18 % (calc) (ref 16–45)
Ferritin: 36 ng/mL (ref 16–288)
Iron: 49 ug/dL (ref 45–160)
TIBC: 266 mcg/dL (calc) (ref 250–450)

## 2019-08-11 LAB — TEST AUTHORIZATION

## 2019-08-11 LAB — MAGNESIUM: Magnesium: 1.8 mg/dL (ref 1.5–2.5)

## 2019-08-15 ENCOUNTER — Ambulatory Visit
Admission: RE | Admit: 2019-08-15 | Discharge: 2019-08-15 | Disposition: A | Discharge status: Home / Self Care | Payer: PPO | Source: Ambulatory Visit | Attending: Radiation Oncology | Admitting: Radiation Oncology

## 2019-08-15 DIAGNOSIS — R911 Solitary pulmonary nodule: Secondary | ICD-10-CM

## 2019-08-15 DIAGNOSIS — Z510 Encounter for antineoplastic radiation therapy: Secondary | ICD-10-CM | POA: Diagnosis not present

## 2019-08-15 NOTE — Progress Notes (Signed)
  Radiation Oncology         [phone] ________________________________  Name: Ashley [name] MRN: [id number]  Date: 08/17/2019  DOB: [birth year]  Stereotactic Body Radiotherapy Treatment Procedure Note  NARRATIVE:  Ashley [name] was brought to the stereotactic radiation treatment machine and placed supine on the CT couch. The patient was set up for stereotactic body radiotherapy on the body fix pillow.  3D TREATMENT PLANNING AND DOSIMETRY:  The patient's radiation plan was reviewed and approved prior to starting treatment.  It showed 3-dimensional radiation distributions overlaid onto the planning CT.  The Ashley [name] for the target structures as well as the organs at risk were reviewed. The documentation of this is filed in the radiation oncology EMR.  SIMULATION VERIFICATION:  The patient underwent CT imaging on the treatment unit.  These were carefully aligned to document that the ablative radiation dose would cover the target volume and maximally spare the nearby organs at risk according to the planned distribution.  SPECIAL TREATMENT PROCEDURE: Raneshia L Leece received high dose ablative stereotactic body radiotherapy to the planned target volume without unforeseen complications. Treatment was delivered uneventfully. The high doses associated with stereotactic body radiotherapy and the significant potential risks require careful treatment set up and patient monitoring constituting a special treatment procedure   STEREOTACTIC TREATMENT MANAGEMENT:  Following delivery, the patient was evaluated clinically. The patient tolerated treatment without significant acute effects, and was discharged to home in stable condition.    PLAN: Continue treatment as planned.  ________________________________  Blair Promise, PhD, MD  This document serves as a record of services personally performed by Gery Pray, MD. It was created on his behalf by Clerance Lav, a trained medical scribe. The creation of this  record is based on the scribe's personal observations and the provider's statements to them. This document has been checked and approved by the attending provider.

## 2019-08-15 NOTE — Progress Notes (Signed)
  Radiation Oncology         [phone] ________________________________  Name: Ashley [name] MRN: [id number]  Date: 08/15/2019  DOB: 07-30-1946  Stereotactic Body Radiotherapy Treatment Procedure Note  NARRATIVE:  Ashley [name] was brought to the stereotactic radiation treatment machine and placed supine on the CT couch. The patient was set up for stereotactic body radiotherapy on the body fix pillow.  3D TREATMENT PLANNING AND DOSIMETRY:  The patient's radiation plan was reviewed and approved prior to starting treatment.  It showed 3-dimensional radiation distributions overlaid onto the planning CT.  The [facility] for the target structures as well as the organs at risk were reviewed. The documentation of this is filed in the radiation oncology EMR.  SIMULATION VERIFICATION:  The patient underwent CT imaging on the treatment unit.  These were carefully aligned to document that the ablative radiation dose would cover the target volume and maximally spare the nearby organs at risk according to the planned distribution.  SPECIAL TREATMENT PROCEDURE: Ashley [name] received high dose ablative stereotactic body radiotherapy to the planned target volume without unforeseen complications. Treatment was delivered uneventfully. The high doses associated with stereotactic body radiotherapy and the significant potential risks require careful treatment set up and patient monitoring constituting a special treatment procedure   STEREOTACTIC TREATMENT MANAGEMENT:  Following delivery, the patient was evaluated clinically. The patient tolerated treatment without significant acute effects, and was discharged to home in stable condition.    PLAN: Continue treatment as planned.  ________________________________  Blair Promise, PhD, MD  This document serves as a record of services personally performed by Gery Pray, MD. It was created on his behalf by Clerance Lav, a trained medical scribe. The creation of this  record is based on the scribe's personal observations and the provider's statements to them. This document has been checked and approved by the attending provider.

## 2019-08-17 ENCOUNTER — Other Ambulatory Visit: Payer: Self-pay

## 2019-08-17 ENCOUNTER — Ambulatory Visit
Admission: RE | Admit: 2019-08-17 | Discharge: 2019-08-17 | Disposition: A | Discharge status: Home / Self Care | Payer: PPO | Source: Ambulatory Visit | Attending: Radiation Oncology | Admitting: Radiation Oncology

## 2019-08-17 DIAGNOSIS — Z510 Encounter for antineoplastic radiation therapy: Secondary | ICD-10-CM | POA: Diagnosis not present

## 2019-08-17 DIAGNOSIS — R911 Solitary pulmonary nodule: Secondary | ICD-10-CM

## 2019-08-18 ENCOUNTER — Encounter: Payer: Self-pay | Admitting: Adult Health Nurse Practitioner

## 2019-08-22 ENCOUNTER — Encounter: Payer: Self-pay | Admitting: Radiation Oncology

## 2019-08-22 ENCOUNTER — Other Ambulatory Visit: Payer: Self-pay

## 2019-08-22 ENCOUNTER — Ambulatory Visit
Admission: RE | Admit: 2019-08-22 | Discharge: 2019-08-22 | Disposition: A | Discharge status: Home / Self Care | Payer: HMO | Source: Ambulatory Visit | Attending: Radiation Oncology | Admitting: Radiation Oncology

## 2019-08-22 DIAGNOSIS — R911 Solitary pulmonary nodule: Secondary | ICD-10-CM

## 2019-08-22 DIAGNOSIS — Z510 Encounter for antineoplastic radiation therapy: Secondary | ICD-10-CM | POA: Diagnosis not present

## 2019-08-22 NOTE — Progress Notes (Signed)
  Radiation Oncology         [phone] ________________________________  Name: Ashley [name] MRN: [id number]  Date: 08/22/2019  DOB: 06/16/1946  Stereotactic Body Radiotherapy Treatment Procedure Note  NARRATIVE:  Ashley [name] was brought to the stereotactic radiation treatment machine and placed supine on the CT couch. The patient was set up for stereotactic body radiotherapy on the body fix pillow.  3D TREATMENT PLANNING AND DOSIMETRY:  The patient's radiation plan was reviewed and approved prior to starting treatment.  It showed 3-dimensional radiation distributions overlaid onto the planning CT.  The [facility] for the target structures as well as the organs at risk were reviewed. The documentation of this is filed in the radiation oncology EMR.  SIMULATION VERIFICATION:  The patient underwent CT imaging on the treatment unit.  These were carefully aligned to document that the ablative radiation dose would cover the target volume and maximally spare the nearby organs at risk according to the planned distribution.  SPECIAL TREATMENT PROCEDURE: Ashley [name] received high dose ablative stereotactic body radiotherapy to the planned target volume without unforeseen complications. Treatment was delivered uneventfully. The high doses associated with stereotactic body radiotherapy and the significant potential risks require careful treatment set up and patient monitoring constituting a special treatment procedure   STEREOTACTIC TREATMENT MANAGEMENT:  Following delivery, the patient was evaluated clinically. The patient tolerated treatment without significant acute effects, and was discharged to home in stable condition.    PLAN: Continue treatment as planned.  ________________________________  Blair Promise, PhD, MD  This document serves as a record of services personally performed by Gery Pray, MD. It was created on his behalf by Clerance Lav, a trained medical scribe. The creation of this  record is based on the scribe's personal observations and the provider's statements to them. This document has been checked and approved by the attending provider.

## 2019-08-24 NOTE — Progress Notes (Incomplete)
  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 Patient did report a cough, but only when smoking. She denied hemoptysis. No significant complaints or exam findings during treatment.  Plan: The patient will follow-up with radiation oncology in one month.  ________________________________________________   Blair Promise, PhD, MD  This document serves as a record of services personally performed by Gery Pray, MD. It was created on his behalf by Clerance Lav, a trained medical scribe. The creation of this record is based on the scribe's personal observations and the provider's statements to them. This document has been checked and approved by the attending provider.

## 2019-08-26 LAB — ACID FAST SMEAR (AFB, MYCOBACTERIA): Acid Fast Smear: NEGATIVE

## 2019-08-26 LAB — FUNGUS CULTURE WITH STAIN

## 2019-08-26 LAB — ACID FAST CULTURE WITH REFLEXED SENSITIVITIES (MYCOBACTERIA): Acid Fast Culture: NEGATIVE

## 2019-08-26 LAB — FUNGAL ORGANISM REFLEX

## 2019-08-26 LAB — FUNGUS CULTURE RESULT

## 2019-08-30 ENCOUNTER — Ambulatory Visit: Payer: Self-pay | Admitting: Adult Health Nurse Practitioner

## 2019-09-04 ENCOUNTER — Other Ambulatory Visit: Payer: Self-pay | Admitting: Internal Medicine

## 2019-09-06 ENCOUNTER — Ambulatory Visit: Payer: Self-pay | Admitting: Adult Health Nurse Practitioner

## 2019-09-06 NOTE — Progress Notes (Deleted)
Assessment and Plan:  Ashley [name] was seen today for medication management.  Diagnoses and all orders for this visit:  Poor compliance with medication Reviewed all medications at length today Encouraged her to switch from BID dosing pill box to TID dosing pill box Options reviewed with patient and husband  Close follow up in 2-3 weeks; bring pill box and all medications at that time Sooner if glucose trending up with cessation of metformin   Anemia, unspecified type Taking iron supplement with vitamin C; no recent check, will add on iron panel  Normocytic anemia last 6 months corresponding with declining renal functions Normal B12 since onset Most likely this is anemia of chronic disease r/t renal functions Check iron and will discontinue iron supplement if WNL -     Adding on Iron/TIBC/ferritin panel  -     CBC with Diff  Essential hypertension Continue medications Monitor blood pressure at home; call if consistently over 130/80 Continue DASH diet.   Reminder to go to the ER if any CP, SOB, nausea, dizziness, severe HA, changes vision/speech, left arm numbness and tingling and jaw pain. -     COMPLETE METABOLIC PANEL WITH GFR -     Magnesium  CKD stage 4 due to type 2 diabetes mellitus (HCC)/Deterioration in renal function STOP metformin, decrease gabapentin per instruction Will have her follow up with [place] [facility] Dr. Christie Nottingham urgently -     COMPLETE METABOLIC PANEL WITH GFR -     Magnesium  Diabetic polyneuropathy associated with type 2 diabetes mellitus (HCC) Cut back from 1200 mg BID to 600 mg BID x 1 week then 300 mg BID -     gabapentin (NEURONTIN) 600 MG tablet; Take 1/2 tablet 2 x /day at Breakfast & Bedtime for Diabetic Neuropathy pains   KEEP CLOSE FOLLOW UP IN JAN 2021  Further disposition pending results of labs. Discussed med's effects and SE's.   Over 30 minutes of exam, counseling, chart review, and critical decision making was performed.   Future  Appointments  Date Time Provider [place]  09/06/2019 11:15 AM Garnet Sierras, NP [facility] None  11/29/2019  2:30 PM Unk Pinto, MD [facility] None  03/06/2020  3:00 PM Garnet Sierras, NP [facility] None  06/19/2020  3:00 PM Unk Pinto, MD [facility] None    ------------------------------------------------------------------------------------------------------------------   HPI There were no vitals taken for this visit.  74 y.o.female with bipolar depression (managed by Dr. [name]), ongoing memory difficulty, poorly controlled T2DM with CKD, recently diagnosed with RLL lung cancer (followed by pulm/onc, will be undergoing radiation therapy) presents for medication management due to problems with medication compliance. Accompanied by her husband.    She had and telephone visit with Pulmonology 07/20/19: Called and spoke with the patient today regarding her recent hospitalization.  She had a delayed pneumothorax following navigational bronchoscopy she was several days out from procedure but developed shortness of breath and presented to the radiation oncology department for CT planning for SBRT which revealed a new right-sided pneumothorax.  She was treated conservatively with oxygen therapy.  And she was successfully discharged home.  Talking to her today she otherwise is doing well.  She has no complaints.  She is anxious about when she may receive radiation treatments.  She did let me know that the markers they had placed on her skin from planning have washed off.  I explained I would reach out to radiation oncology and let her them know that she is home.  We also reviewed her  Percept[employer] data.  This revealed a greater than 90% positive predictive value for malignancy based on genetic testing from the right mainstem bronchial brushings.  * Reviewed all medications with patient at length today. Poor insight in medications and frequently contradicts previous statements of how she  is taking meds.   It came to my attention that this patient has declining renal functions, has progressed from CKD III to IV in the past year with last GFR 16 but remains on metformin in addition to glipizide 5 mg TID with meals and novolin N variable dose. She was not aware of CKD 4 status.    She has not been working on diet and exercise for T2 diabetes with CKD 4, diaberic retinopathy, neuropathy, and denies foot ulcerations, hypoglycemia , increased appetite, polydipsia, polyuria, visual disturbances and vomiting. Last A1C in the office was:  Lab Results  Component Value Date   HGBA1C 7.5 (H) 07/01/2019    She also reports taking gabapentin 1200 mg BID for neuropathy.  Losartan was apparently recently discontinued and transitioned to amlodipine.  She is aware to avoid NSAIDs and denies taking this.  Does admit to poor water intake, admits to 4+ dark sodas daily.  She has seen Dr. Everardo Pacific at [facility] in 07/2018, was apparently advised 6 month follow up but never did so due to pandemic.    Lab Results  Component Value Date   GFRNONAA 18 (L) 08/09/2019   GFRNONAA 16 (L) 07/01/2019   GFRNONAA 19 (L) 06/24/2019   Lab Results  Component Value Date   CREATININE 2.61 (H) 08/09/2019   CREATININE 2.78 (H) 07/01/2019   CREATININE 2.80 (H) 06/30/2019    She has hemoglobin trending down, intermittent normocytic anemia ongoing since 02/2019 on review:  CBC Latest Ref Rng & Units 08/09/2019 07/01/2019 06/30/2019  WBC 3.8 - 10.8 Thousand/uL 6.2 5.6 -  Hemoglobin 11.7 - 15.5 g/dL 9.9(L) 9.6(L) 10.9(L)  Hematocrit 35.0 - 45.0 % 30.6(L) 30.2(L) 32.0(L)  Platelets 140 - 400 Thousand/uL 295 283 -   She is on an iron supplement BID with vitamin C though no recent checks from what I can tell, more likely she has anemia of chronic disease related to CKD.  Lab Results  Component Value Date   IRON 49 08/09/2019   TIBC 266 08/09/2019   FERRITIN 36 08/09/2019   She does  have documented history of B12 def anemia though recent check since onset of anemia was WNL:  Lab Results  Component Value Date   [id number] 010 03/10/2019   No results found for: RETICCTPCT    Past Medical History:  Diagnosis Date  . Anxiety disorder   . Arthritis LOWER BACK  . Asthma   . Bipolar 1 disorder ([place])   . Carpal tunnel syndrome   . Chronic kidney disease    stage 3  . Depression   . Diabetes mellitus    type 2  . Dyslipidemia   . Dyspnea    with much activity  . Gait disorder   . GERD (gastroesophageal reflux disease)   . Gout LEFT FOOT-  STABLE  . H/O hiatal hernia   . History of CVA (cerebrovascular accident) FOUND PER MRI 1994--  RESIDUAL MEMORY IMPAIRED  . Hypercholesteremia   . Hypertension   . Memory difficulty 08/14/2016  . Neuropathy of lower extremity   . OCD (obsessive compulsive disorder)   . Peripheral neuropathy   . SOB (shortness of breath) on exertion   . Stroke [facility])  2000 memory loss   . SUI (stress urinary incontinence, female)   . 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 Vulvar lesion      Allergies  Allergen Reactions  . Sulfa Antibiotics Other (See Comments)    Pt states she had "extreme pain"  . Sulfacetamide Sodium Other (See Comments)    Pain    Current Outpatient Medications on File Prior to Visit  Medication Sig  . acetaminophen (TYLENOL) 500 MG tablet Take 1,000 mg by mouth every 6 (six) hours as needed for mild pain or moderate pain.  Marland Kitchen amLODipine (NORVASC) 10 MG tablet Take 1 tablet (10 mg total) by mouth daily.  Marland Kitchen aspirin EC 81 MG tablet Take 81 mg by mouth daily.  Marland Kitchen atorvastatin (LIPITOR) 40 MG tablet Take 1 tablet Daily for Cholesterol  . Cholecalciferol (VITAMIN D3) 25 MCG (1000 UT) CAPS Take by mouth.  . Cinnamon 500 MG TABS Take 500 mg by mouth daily.   . clonazePAM (KLONOPIN) 1 MG tablet Take 1 mg by mouth at bedtime.   . famotidine (PEPCID) 20 MG tablet Take 1 tablet 2 x /day with meals for Acid Indigestion  (Patient taking differently: Take 20 mg by mouth 2 (two) times daily. with meals for Acid Indigestion)  . fenofibrate (TRICOR) 145 MG tablet TAKE 1 TABLET DAILY FOR TRIGLYCERIDES (BLOOD FATS)  . ferrous sulfate 325 (65 FE) MG EC tablet Take 1 tab of ferrous sulfate (iron) twice daily by mouth with vitamin C.  . FLUoxetine (PROZAC) 40 MG capsule TAKE ONE CAPSULE BY MOUTH EVERY DAY (Patient taking differently: Take 40 mg by mouth daily. )  . gabapentin (NEURONTIN) 600 MG tablet Take 1/2 tablet 2 x /day at Breakfast & Bedtime for Diabetic Neuropathy pains  . glipiZIDE (GLUCOTROL) 5 MG tablet Take 1 tablet 3 x  /day with Meals for Diabetes (Patient taking differently: Take 5 mg by mouth 3 (three) times daily with meals. )  . hydrALAZINE (APRESOLINE) 10 MG tablet Take 1 tablet (10 mg total) by mouth 3 (three) times daily.  . hydrochlorothiazide (MICROZIDE) 12.5 MG capsule Take 2 capsules (25 mg total) by mouth daily.  Marland Kitchen levothyroxine (SYNTHROID) 100 MCG tablet Take 1 tablet (100 mcg total) by mouth daily.  . Magnesium 400 MG TABS Take 400 mg by mouth 3 (three) times daily with meals.   . Multiple Vitamins-Minerals (MULTIVITAMIN WITH MINERALS) tablet Take 1 tablet by mouth daily.  Marland Kitchen NOVOLIN N 100 UNIT/ML injection INJECT 50 UNITS EVERY MORNING AND 25 UNITS EVERY EVENING (Patient taking differently: Inject 30-50 Units into the skin daily before breakfast. 130 or higher 30 units 200- 50 units)  . ondansetron (ZOFRAN ODT) 8 MG disintegrating tablet Take 1 tablet (8 mg total) by mouth every 8 (eight) hours as needed for nausea or vomiting.  Glory Rosebush ULTRA test strip TEST BLOOD SUGAR 3 TIMES A DAY  . potassium chloride (KLOR-CON M10) 10 MEQ tablet Take 1 tablet 3 x /day with Meals for Potassium  . vitamin C (ASCORBIC ACID) 500 MG tablet Take 500 mg by mouth daily. Take with iron/ferrous sulfate  . Zinc 50 MG CAPS Take 50 mg by mouth daily.   . [DISCONTINUED] losartan (COZAAR) 100 MG tablet Take 1 tablet  Daily for BP & Diabetic Kidney Protection   No current facility-administered medications on file prior to visit.    ROS: all negative except above.   Physical Exam:  There were no vitals taken for this visit.  General Appearance: Well nourished, well  dressed elderly female in no apparent distress. Eyes: PERRLA, EOMs, conjunctiva no swelling or erythema ENT/Mouth: Mask in place; oral exam deferred. Hearing normal.  Neck: Supple Respiratory: Respiratory effort normal, BS equal bilaterally without rales, rhonchi, wheezing or stridor.  Cardio: RRR with no MRGs. Intact peripheral pulses with bilateral ankle and pedal edema.  Abdomen: Soft, + BS.  Non tender, no guarding, rebound, hernias, masses. Lymphatics: Non tender without lymphadenopathy.  Musculoskeletal: No obvious deformity; in wheelchair, gait not assessed Skin: Warm, dry without rashes, lesions, ecchymosis.  Neuro:  Normal muscle tone, Sensation intact.  Psych: Awake and oriented X 3, normal affect, wandering through process, Insight and Judgment is poor    Garnet Sierras, NP 9:56 AM [facility]

## 2019-09-07 ENCOUNTER — Other Ambulatory Visit: Payer: Self-pay | Admitting: Internal Medicine

## 2019-09-07 DIAGNOSIS — N183 Chronic kidney disease, stage 3 unspecified: Secondary | ICD-10-CM

## 2019-09-08 ENCOUNTER — Other Ambulatory Visit: Payer: HMO

## 2019-09-09 ENCOUNTER — Telehealth: Payer: Self-pay | Admitting: *Deleted

## 2019-09-09 NOTE — Telephone Encounter (Signed)
CALLED PATIENT TO ASK ABOUT COMING IN FOR A FU, PATIENT AGREED TO COME IN @ 1:30 PM ON Thursday FEB. [place]

## 2019-09-13 ENCOUNTER — Other Ambulatory Visit: Payer: HMO

## 2019-09-20 ENCOUNTER — Ambulatory Visit
Admission: RE | Admit: 2019-09-20 | Discharge: 2019-09-20 | Disposition: A | Discharge status: Home / Self Care | Payer: HMO | Source: Ambulatory Visit | Attending: Internal Medicine | Admitting: Internal Medicine

## 2019-09-20 DIAGNOSIS — N183 Chronic kidney disease, stage 3 unspecified: Secondary | ICD-10-CM

## 2019-09-26 ENCOUNTER — Other Ambulatory Visit: Payer: Self-pay | Admitting: Physician Assistant

## 2019-09-26 DIAGNOSIS — E039 Hypothyroidism, unspecified: Secondary | ICD-10-CM

## 2019-09-29 NOTE — Progress Notes (Signed)
History of Present Illness:     This 74 yo MWF with HTN, HLD, T2_DM was dx'd with RLL Lung Ca in  Nov 2020. She had a brief hospitalization in Nov s/p Needle Bx and was treated conservatively. Her renal functions had worsened and her Metformin & Glipizide & also her Gabapentin were D/C'd and she was switched to Novolin BID with SS Insulin coverage. In late December, she underwent high dose ablative stereotactic Radiotherapy to the RLL nodule.      She presents today with concerns re: circulation to he feet & toes.   Medications  .  glipiZIDE (GLUCOTROL) 5 MG tablet, Take 1 tablet 3 x day .  Levothyroxine 100 MCG , Take 1 tablet daily  .  NOVOLIN N 1 Inject 30-50units  .  amLODipine  10 MG tablet, Take 1 tablet  daily. Marland Kitchen  atorvastatin  40 MG tablet, Take 1 tablet Daily for Cholesterol .  fenofibrate  145 MG tablet, TAKE 1 TABLET DAILY  .  hydrALAZINE  10 MG tablet, Take 1 tablet 3  times daily. .  hydrochlorothiazide  12.5 MG capsule, Take 2 capsules  daily. .  Acetaminophen 500 MG tablet, Take 1,000 mg  every 6  hours as needed for mild pain. Marland Kitchen  aspirin EC 81 MG tablet, Take  daily. .  ferrous sulfate 325 (65 FE) MG EC tablet, Take 1 tab  twice daily with vitamin C. .  VITAMIN D 25 MCG (1000 UT .  Cinnamon 500 MG TABS, Take 500 mg  daily.  .  clonazePAM  1 MG tablet, Take 1 mg  at bedtime.  .  famotidine  20 MG tablet, Take 1 tablet 2 x /day with meals for Acid Indigestion  .  FLUoxetine (PROZAC) 40 MG capsule, TAKE ONE CAPSULE EVERY DAY  .  Gabapentin 600 MG tablet, Take 1/2 tablet 2 x /day at Breakfast & Bedtime f .  Magnesium 400 MG TABS, Take  3  times daily with meals.  .  Multiple Vitamins-Minerals , Take 1 tablet  daily. .  ondansetron ODT 8 MG , Take 1 tablet  every 8  hours as needed for nausea  .  potassium chloride  10 MEQ tablet, Take 1 tablet 3 x /day  .  vitamin C 500 MG  Take 500 mg  daily with iron/ferrous sulfate .  Zinc 50 MG CAPS, Take 50 mg daily.   Problem  list She has Major depressive disorder, recurrent episode ([place]); Essential hypertension; COPD; Carpal tunnel syndrome; Diabetic neuropathy ([place]); Abnormality of gait; Vitamin D deficiency; Medication management; Bipolar depression ([place]); Non compliance w medication regimen; Gout; Hypothyroidism; GERD (gastroesophageal reflux disease); CKD stage 4 due to type 2 diabetes mellitus ([place]); Frequent falls; Memory difficulty; Hyperlipidemia associated with type 2 diabetes mellitus ([place]); Mild nonproliferative diabetic retinopathy of both eyes without macular edema associated with type 2 diabetes mellitus ([place]); Senile purpura ([place]); Polyp of ascending colon; Peripheral arterial disease ([place]); Diabetes mellitus type 2, uncontrolled ([place]); Obesity (BMI 30.0-34.9); Long term (current) use of insulin ([place]); Right lower lobe pulmonary nodule; Pneumothorax; Hypokalemia; Hypertensive urgency; and CKD (chronic kidney disease), stage IV (HCC) on their problem list.   Observations/Objective:  BP 120/88   Pulse 72   Temp (!) 97.5 F (36.4 C)   Resp 16   Ht 5' 3.5" (1.613 m)   Wt 154 lb 9.6 oz (70.1 kg)   BMI 26.96 kg/m   HEENT - WNL. Neck - supple.  Chest - Clear  equal BS. Cor - Nl HS. RRR w/o sig MGR. PP 1(+).  Nl capillary refill to feet. No edema. MS- FROM w/o deformities.  Gait Nl. Neuro -  Nl w/o focal abnormalities.  Assessment and Plan:  1. Labile hypertension  - CBC with Differential/Platelet - COMPLETE METABOLIC PANEL WITH GFR - Magnesium - TSH  2. Type 2 diabetes mellitus with stage 4 chronic kidney disease,  with long-term current use of insulin (HCC)  - Hemoglobin A1c  3. Hypothyroidism, unspecified type  - TSH  4. Vitamin D deficiency  - VITAMIN D 25 Hydroxy   5. Poor compliance with medication  6. Medication management  - CBC with Differential/Platelet - COMPLETE METABOLIC PANEL WITH GFR - Magnesium - Lipid panel - TSH - Hemoglobin A1c - VITAMIN D 25 Hydroxy   7.  Hyperlipidemia associated with type 2 diabetes mellitus ([place])  - Lipid panel      I discussed the assessment and treatment plan with the patient. The patient was provided an opportunity to ask questions and all were answered. The patient agreed with the plan and demonstrated an understanding of the instructions.  The patient was advised to call back or seek an in-person evaluation if the symptoms worsen or if the condition fails to improve as anticipated.   Kirtland Bouchard,  MD  To prevent harm (release of this note would result in harm to the life or physical safety of the patient or another).

## 2019-09-30 ENCOUNTER — Ambulatory Visit (INDEPENDENT_AMBULATORY_CARE_PROVIDER_SITE_OTHER): Payer: HMO | Admitting: Internal Medicine

## 2019-09-30 ENCOUNTER — Other Ambulatory Visit: Payer: Self-pay

## 2019-09-30 VITALS — BP 120/88 | HR 72 | Temp 97.5°F | Resp 16 | Ht 63.5 in | Wt 154.6 lb

## 2019-09-30 DIAGNOSIS — Z9114 Patient's other noncompliance with medication regimen: Secondary | ICD-10-CM

## 2019-09-30 DIAGNOSIS — E785 Hyperlipidemia, unspecified: Secondary | ICD-10-CM

## 2019-09-30 DIAGNOSIS — R0989 Other specified symptoms and signs involving the circulatory and respiratory systems: Secondary | ICD-10-CM | POA: Diagnosis not present

## 2019-09-30 DIAGNOSIS — E1169 Type 2 diabetes mellitus with other specified complication: Secondary | ICD-10-CM

## 2019-09-30 DIAGNOSIS — E559 Vitamin D deficiency, unspecified: Secondary | ICD-10-CM

## 2019-09-30 DIAGNOSIS — Z79899 Other long term (current) drug therapy: Secondary | ICD-10-CM

## 2019-09-30 DIAGNOSIS — E039 Hypothyroidism, unspecified: Secondary | ICD-10-CM

## 2019-09-30 DIAGNOSIS — Z794 Long term (current) use of insulin: Secondary | ICD-10-CM

## 2019-09-30 DIAGNOSIS — E1122 Type 2 diabetes mellitus with diabetic chronic kidney disease: Secondary | ICD-10-CM

## 2019-09-30 DIAGNOSIS — N184 Chronic kidney disease, stage 4 (severe): Secondary | ICD-10-CM

## 2019-09-30 NOTE — Patient Instructions (Signed)

## 2019-10-01 ENCOUNTER — Other Ambulatory Visit: Payer: Self-pay | Admitting: Internal Medicine

## 2019-10-01 DIAGNOSIS — E039 Hypothyroidism, unspecified: Secondary | ICD-10-CM

## 2019-10-01 LAB — CBC WITH DIFFERENTIAL/PLATELET
Absolute Monocytes: 521 cells/uL (ref 200–950)
Basophils Absolute: 33 cells/uL (ref 0–200)
Basophils Relative: 0.5 %
Eosinophils Absolute: 92 cells/uL (ref 15–500)
Eosinophils Relative: 1.4 %
HCT: 32.6 % — ABNORMAL LOW (ref 35.0–45.0)
Hemoglobin: 11.1 g/dL — ABNORMAL LOW (ref 11.7–15.5)
Lymphs Abs: 1089 cells/uL (ref 850–3900)
MCH: 30.1 pg (ref 27.0–33.0)
MCHC: 34 g/dL (ref 32.0–36.0)
MCV: 88.3 fL (ref 80.0–100.0)
MPV: 9.9 fL (ref 7.5–12.5)
Monocytes Relative: 7.9 %
Neutro Abs: 4864 cells/uL (ref 1500–7800)
Neutrophils Relative %: 73.7 %
Platelets: 282 10*3/uL (ref 140–400)
RBC: 3.69 10*6/uL — ABNORMAL LOW (ref 3.80–5.10)
RDW: 12.8 % (ref 11.0–15.0)
Total Lymphocyte: 16.5 %
WBC: 6.6 10*3/uL (ref 3.8–10.8)

## 2019-10-01 LAB — LIPID PANEL
Cholesterol: 157 mg/dL (ref <200)
HDL: 40 mg/dL — ABNORMAL LOW (ref >=50)
LDL Cholesterol (Calc): 88 mg/dL (calc)
Non-HDL Cholesterol (Calc): 117 mg/dL (calc) (ref <130)
Total CHOL/HDL Ratio: 3.9 (calc) (ref <5.0)
Triglycerides: 195 mg/dL — ABNORMAL HIGH (ref <150)

## 2019-10-01 LAB — MAGNESIUM: Magnesium: 2.5 mg/dL (ref 1.5–2.5)

## 2019-10-01 LAB — COMPLETE METABOLIC PANEL WITH GFR
AG Ratio: 1.3 (calc) (ref 1.0–2.5)
ALT: 17 U/L (ref 6–29)
AST: 20 U/L (ref 10–35)
Albumin: 3.3 g/dL — ABNORMAL LOW (ref 3.6–5.1)
Alkaline phosphatase (APISO): 50 U/L (ref 37–153)
BUN/Creatinine Ratio: 13 (calc) (ref 6–22)
BUN: 27 mg/dL — ABNORMAL HIGH (ref 7–25)
CO2: 27 mmol/L (ref 20–32)
Calcium: 9.3 mg/dL (ref 8.6–10.4)
Chloride: 102 mmol/L (ref 98–110)
Creat: 2.14 mg/dL — ABNORMAL HIGH (ref 0.60–0.93)
GFR, Est African American: 26 mL/min/{1.73_m2} — ABNORMAL LOW (ref >=60)
GFR, Est Non African American: 22 mL/min/{1.73_m2} — ABNORMAL LOW (ref >=60)
Globulin: 2.5 g/dL (calc) (ref 1.9–3.7)
Glucose, Bld: 164 mg/dL — ABNORMAL HIGH (ref 65–99)
Potassium: 4.8 mmol/L (ref 3.5–5.3)
Sodium: 136 mmol/L (ref 135–146)
Total Bilirubin: 0.2 mg/dL (ref 0.2–1.2)
Total Protein: 5.8 g/dL — ABNORMAL LOW (ref 6.1–8.1)

## 2019-10-01 LAB — HEMOGLOBIN A1C
Hgb A1c MFr Bld: 8 % of total Hgb — ABNORMAL HIGH (ref <5.7)
Mean Plasma Glucose: 183 (calc)
eAG (mmol/L): 10.1 (calc)

## 2019-10-01 LAB — VITAMIN D 25 HYDROXY (VIT D DEFICIENCY, FRACTURES): Vit D, 25-Hydroxy: 24 ng/mL — ABNORMAL LOW (ref 30–100)

## 2019-10-01 LAB — TSH: TSH: 0.02 mIU/L — ABNORMAL LOW (ref 0.40–4.50)

## 2019-10-01 MED ORDER — LEVOTHYROXINE SODIUM 88 MCG PO TABS: ORAL_TABLET | ORAL | 1 refills | Status: DC

## 2019-10-02 ENCOUNTER — Encounter: Payer: Self-pay | Admitting: Internal Medicine

## 2019-10-03 ENCOUNTER — Other Ambulatory Visit: Payer: Self-pay | Admitting: Internal Medicine

## 2019-10-03 DIAGNOSIS — B351 Tinea unguium: Secondary | ICD-10-CM

## 2019-10-03 MED ORDER — TERBINAFINE HCL 250 MG PO TABS: ORAL_TABLET | ORAL | 1 refills | Status: DC

## 2019-10-05 ENCOUNTER — Telehealth: Payer: Self-pay | Admitting: *Deleted

## 2019-10-05 NOTE — Telephone Encounter (Signed)
CALLED PATIENT TO INFORM OF FU BEING RESCHEDULED FOR 10-20-19 @ 10:45 AM, SPOKE WITH PATIENT AND SHE IS AWARE OF THIS APPT.

## 2019-10-06 ENCOUNTER — Ambulatory Visit: Payer: HMO | Admitting: Radiation Oncology

## 2019-10-20 ENCOUNTER — Other Ambulatory Visit: Payer: Self-pay

## 2019-10-20 ENCOUNTER — Ambulatory Visit
Admission: RE | Admit: 2019-10-20 | Discharge: 2019-10-20 | Disposition: A | Discharge status: Home / Self Care | Payer: HMO | Source: Ambulatory Visit | Attending: Radiation Oncology | Admitting: Radiation Oncology

## 2019-10-20 ENCOUNTER — Encounter: Payer: Self-pay | Admitting: Radiation Oncology

## 2019-10-20 VITALS — BP 162/76 | HR 80 | Temp 98.0°F | Resp 20 | Wt 156.6 lb

## 2019-10-20 DIAGNOSIS — Z923 Personal history of irradiation: Secondary | ICD-10-CM | POA: Insufficient documentation

## 2019-10-20 DIAGNOSIS — Z79899 Other long term (current) drug therapy: Secondary | ICD-10-CM | POA: Diagnosis not present

## 2019-10-20 DIAGNOSIS — R911 Solitary pulmonary nodule: Secondary | ICD-10-CM | POA: Diagnosis not present

## 2019-10-20 DIAGNOSIS — Z7984 Long term (current) use of oral hypoglycemic drugs: Secondary | ICD-10-CM | POA: Insufficient documentation

## 2019-10-20 NOTE — Progress Notes (Signed)
Radiation Oncology         [phone] ________________________________  Name: Ashley [name] MRN: [id number]  Date: 10/20/2019  DOB: 02-Jun-1946  Follow-Up Visit Note  CC: Unk Pinto, MD  Unk Pinto, MD    ICD-10-CM   1. Right lower lobe pulmonary nodule  R91.1 CT Chest Wo Contrast    Diagnosis:   Presumptivenon-small cell right lung cancer (T1b, N0, M0) stage 1A presentingin the right lower lobe of the lung  Interval Since Last Radiation:  2 months 08/15/2019 through 08/22/2019 Site Technique Total Dose (Gy) Dose per Fx (Gy) Completed Fx Beam Energies  Lung, Right: Lung_Rt IMRT 54/54 18 3/3 [id number]    Narrative:  The patient returns today for routine follow-up.    On review of systems, she denies any side effects related to radiation therapy. She reports chronic cough and shortness of breath related to smoking. She denies hemoptysis and difficulty swallowing.   ALLERGIES:  is allergic to sulfa antibiotics and sulfacetamide sodium.  Meds: Current Outpatient Medications  Medication Sig Dispense Refill  . acetaminophen (TYLENOL) 500 MG tablet Take 1,000 mg by mouth every 6 (six) hours as needed for mild pain or moderate pain.    Marland Kitchen amLODipine (NORVASC) 10 MG tablet Take 1 tablet (10 mg total) by mouth daily. 30 tablet 0  . aspirin EC 81 MG tablet Take 81 mg by mouth daily.    Marland Kitchen atorvastatin (LIPITOR) 40 MG tablet Take 1 tablet Daily for Cholesterol 90 tablet 3  . Cholecalciferol (VITAMIN D3) 25 MCG (1000 UT) CAPS Take by mouth.    . Cinnamon 500 MG TABS Take 500 mg by mouth daily.     . clonazePAM (KLONOPIN) 1 MG tablet Take 1 mg by mouth at bedtime.     . famotidine (PEPCID) 20 MG tablet Take 1 tablet 2 x /day with meals for Acid Indigestion (Patient taking differently: Take 20 mg by mouth 2 (two) times daily. with meals for Acid Indigestion) 180 tablet 3  . fenofibrate (TRICOR) 145 MG tablet TAKE 1 TABLET DAILY FOR TRIGLYCERIDES (BLOOD FATS) 90 tablet 1  . ferrous  sulfate 325 (65 FE) MG EC tablet Take 1 tab of ferrous sulfate (iron) twice daily by mouth with vitamin C.    . FLUoxetine (PROZAC) 40 MG capsule TAKE ONE CAPSULE BY MOUTH EVERY DAY (Patient taking differently: Take 40 mg by mouth daily. ) 90 capsule 3  . glipiZIDE (GLUCOTROL) 5 MG tablet Take 1 tablet 3 x  /day with Meals for Diabetes (Patient taking differently: Take 5 mg by mouth 3 (three) times daily with meals. ) 270 tablet 1  . hydrALAZINE (APRESOLINE) 10 MG tablet Take 1 tablet (10 mg total) by mouth 3 (three) times daily. 90 tablet 0  . hydrochlorothiazide (MICROZIDE) 12.5 MG capsule Take 2 capsules (25 mg total) by mouth daily. 60 capsule 0  . levothyroxine (SYNTHROID) 88 MCG tablet Take 1 tablet daily on an empty stomach with only water for 30 minutes & no Antacid meds, Calcium or Magnesium for 4 hours & avoid Biotin 90 tablet 1  . Magnesium 400 MG TABS Take 400 mg by mouth 3 (three) times daily with meals.     . Multiple Vitamins-Minerals (MULTIVITAMIN WITH MINERALS) tablet Take 1 tablet by mouth daily.    Marland Kitchen NOVOLIN N 100 UNIT/ML injection INJECT 50 UNITS EVERY MORNING AND 25 UNITS EVERY EVENING (Patient taking differently: Inject 30-50 Units into the skin daily before breakfast. 130 or higher 30 units 200- 50  units) 30 mL 4  . ondansetron (ZOFRAN ODT) 8 MG disintegrating tablet Take 1 tablet (8 mg total) by mouth every 8 (eight) hours as needed for nausea or vomiting. 12 tablet 0  . ONETOUCH ULTRA test strip TEST BLOOD SUGAR 3 TIMES A DAY 300 strip 1  . potassium chloride (KLOR-CON M10) 10 MEQ tablet Take 1 tablet 3 x /day with Meals for Potassium 270 tablet 1  . terbinafine (LAMISIL) 250 MG tablet Take 1 tablet Daily for Toenail Fungus 90 tablet 1  . vitamin C (ASCORBIC ACID) 500 MG tablet Take 500 mg by mouth daily. Take with iron/ferrous sulfate    . Zinc 50 MG CAPS Take 50 mg by mouth daily.     Marland Kitchen gabapentin (NEURONTIN) 600 MG tablet Take 1/2 tablet 2 x /day at Breakfast & Bedtime for  Diabetic Neuropathy pains (Patient not taking: Reported on 09/30/2019) 180 tablet 3   No current facility-administered medications for this encounter.    Physical Findings: The patient is in no acute distress. Patient is alert and oriented.  weight is 156 lb 9.6 oz (71 kg). Her temperature is 98 F (36.7 C). Her blood pressure is 162/76 (abnormal) and her pulse is 80. Her respiration is 20 and oxygen saturation is 100%. .  No significant changes. Lungs are clear to auscultation bilaterally. Heart has regular rate and rhythm. No palpable cervical, supraclavicular, or axillary adenopathy. Abdomen soft, non-tender, normal bowel sounds.   Lab Findings: Lab Results  Component Value Date   WBC 6.6 09/30/2019   HGB 11.1 (L) 09/30/2019   HCT 32.6 (L) 09/30/2019   MCV 88.3 09/30/2019   PLT 282 09/30/2019    Radiographic Findings: No results found.  Impression:  [place] [facility]  The patient overall tolerated her radiation therapy well  Plan: Patient will be  scheduled for a CT scan of the chest in 3 months and follow-up soon afterward.  ____________________________________ Gery Pray, MD    This document serves as a record of services personally performed by Gery Pray, MD. It was created on his behalf by Wilburn Mylar, a trained medical scribe. The creation of this record is based on the scribe's personal observations and the provider's statements to them. This document has been checked and approved by the attending provider.

## 2019-10-20 NOTE — Progress Notes (Signed)
Ashley [name] presents today for f/u with Dr. [name]. Pt denies any side effects associated with XRT. Pt ruminates and states "I didn't notice any unless they blended in with the rest of my sickness".  Pt reports only smokers' cough, non-productive. Pt denies hemoptysis. Pt denies difficulty swallowing. Pt reports "I've always had that (SOB)".  BP (!) 162/76 (BP Location: Left Arm, Patient Position: Sitting, Cuff Size: Normal)   Pulse 80   Temp 98 F (36.7 C)   Resp 20   Wt 156 lb 9.6 oz (71 kg)   SpO2 100%   BMI 27.31 kg/m   Wt Readings from Last 3 Encounters:  10/20/19 156 lb 9.6 oz (71 kg)  09/30/19 154 lb 9.6 oz (70.1 kg)  08/09/19 172 lb (78 kg)   Loma Sousa, RN BSN

## 2019-10-20 NOTE — Patient Instructions (Signed)
Coronavirus (COVID-19) Are you at risk?  Are you at risk for the Coronavirus (COVID-19)?  To be considered HIGH RISK for Coronavirus (COVID-19), you have to meet the following criteria:  . Traveled to [place], [place], [place], [place] or [place]; or in the [place] to [place], [place], [place], or [place]; and have fever, cough, and shortness of breath within the last 2 weeks of travel OR . Been in close contact with a person diagnosed with COVID-19 within the last 2 weeks and have fever, cough, and shortness of breath . IF YOU DO NOT MEET THESE CRITERIA, YOU ARE CONSIDERED LOW RISK FOR COVID-19.  What to do if you are HIGH RISK for COVID-19?  . If you are having a medical emergency, call 911. . Seek medical care right away. Before you go to a doctor's office, urgent care or emergency department, call ahead and tell them about your recent travel, contact with someone diagnosed with COVID-19, and your symptoms. You should receive instructions from your physician's office regarding next steps of care.  . When you arrive at healthcare provider, tell the healthcare staff immediately you have returned from visiting [place], [place], [place], [place] or [place]; or traveled in the [place] to [place], [place], [place], or [place]; in the last two weeks or you have been in close contact with a person diagnosed with COVID-19 in the last 2 weeks.   . Tell the health care staff about your symptoms: fever, cough and shortness of breath. . After you have been seen by a medical provider, you will be either: o Tested for (COVID-19) and discharged home on quarantine except to seek medical care if symptoms worsen, and asked to  - Stay home and avoid contact with others until you get your results (4-5 days)  - Avoid travel on public transportation if possible (such as bus, train, or airplane) or o Sent to the Emergency Department by EMS for evaluation, COVID-19 testing, and possible  admission depending on your condition and test results.  What to do if you are LOW RISK for COVID-19?  Reduce your risk of any infection by using the same precautions used for avoiding the common cold or flu:  . Wash your hands often with soap and warm water for at least 20 seconds.  If soap and water are not readily available, use an alcohol-based hand sanitizer with at least 60% alcohol.  . If coughing or sneezing, cover your mouth and nose by coughing or sneezing into the elbow areas of your shirt or coat, into a tissue or into your sleeve (not your hands). . Avoid shaking hands with others and consider head nods or verbal greetings only. . Avoid touching your eyes, nose, or mouth with unwashed hands.  . Avoid close contact with people who are sick. . Avoid places or events with large numbers of people in one location, like concerts or sporting events. . Carefully consider travel plans you have or are making. . If you are planning any travel outside or inside the [place], visit the CDC's Travelers' Health webpage for the latest health notices. . If you have some symptoms but not all symptoms, continue to monitor at home and seek medical attention if your symptoms worsen. . If you are having a medical emergency, call 911.   ADDITIONAL HEALTHCARE OPTIONS FOR PATIENTS  [facility] / e-Visit: [email or web address]         [facility]: [phone]  [facility]: [phone]                   [facility]: [phone]   

## 2019-10-31 ENCOUNTER — Other Ambulatory Visit: Payer: Self-pay | Admitting: Internal Medicine

## 2019-10-31 ENCOUNTER — Other Ambulatory Visit: Payer: Self-pay | Admitting: Physician Assistant

## 2019-10-31 DIAGNOSIS — D649 Anemia, unspecified: Secondary | ICD-10-CM

## 2019-10-31 DIAGNOSIS — I10 Essential (primary) hypertension: Secondary | ICD-10-CM

## 2019-11-02 ENCOUNTER — Other Ambulatory Visit: Payer: Self-pay | Admitting: General Practice

## 2019-11-02 NOTE — Patient Outreach (Signed)
Client is newly enrolled in the Special Needs Plan program with Type II Diabetes. Recent Hgb A1C is 8.0 %.  No Health Risk Assessment on file, Individualized Care Plan (ICP) completed from information in the EMR. Client also has a history of recently diagnosed Lung Cancer receiving radiation therapy, Hyperlipidemia and Major Depressive Disorder. Will send introductory letter with ICP to the primary provider and client, along with educational materials. Assigned RN Care Coordinator will follow up in 3 months.

## 2019-11-15 ENCOUNTER — Other Ambulatory Visit: Payer: Self-pay | Admitting: Internal Medicine

## 2019-11-15 DIAGNOSIS — R609 Edema, unspecified: Secondary | ICD-10-CM

## 2019-11-21 ENCOUNTER — Other Ambulatory Visit: Payer: HMO

## 2019-11-22 ENCOUNTER — Ambulatory Visit
Admission: RE | Admit: 2019-11-22 | Discharge: 2019-11-22 | Disposition: A | Discharge status: Home / Self Care | Payer: HMO | Source: Ambulatory Visit | Attending: Internal Medicine | Admitting: Internal Medicine

## 2019-11-22 ENCOUNTER — Emergency Department (HOSPITAL_COMMUNITY)
Admission: EM | Admit: 2019-11-22 | Discharge: 2019-11-22 | Disposition: A | Discharge status: Home / Self Care | Payer: HMO | Attending: Emergency Medicine | Admitting: Emergency Medicine

## 2019-11-22 ENCOUNTER — Encounter (HOSPITAL_COMMUNITY): Payer: Self-pay | Admitting: Emergency Medicine

## 2019-11-22 ENCOUNTER — Other Ambulatory Visit: Payer: Self-pay

## 2019-11-22 DIAGNOSIS — I129 Hypertensive chronic kidney disease with stage 1 through stage 4 chronic kidney disease, or unspecified chronic kidney disease: Secondary | ICD-10-CM | POA: Insufficient documentation

## 2019-11-22 DIAGNOSIS — J449 Chronic obstructive pulmonary disease, unspecified: Secondary | ICD-10-CM | POA: Diagnosis not present

## 2019-11-22 DIAGNOSIS — E1122 Type 2 diabetes mellitus with diabetic chronic kidney disease: Secondary | ICD-10-CM | POA: Insufficient documentation

## 2019-11-22 DIAGNOSIS — Z85118 Personal history of other malignant neoplasm of bronchus and lung: Secondary | ICD-10-CM | POA: Diagnosis not present

## 2019-11-22 DIAGNOSIS — Z923 Personal history of irradiation: Secondary | ICD-10-CM | POA: Diagnosis not present

## 2019-11-22 DIAGNOSIS — R609 Edema, unspecified: Secondary | ICD-10-CM

## 2019-11-22 DIAGNOSIS — Z79899 Other long term (current) drug therapy: Secondary | ICD-10-CM | POA: Diagnosis not present

## 2019-11-22 DIAGNOSIS — Z794 Long term (current) use of insulin: Secondary | ICD-10-CM | POA: Diagnosis not present

## 2019-11-22 DIAGNOSIS — E1140 Type 2 diabetes mellitus with diabetic neuropathy, unspecified: Secondary | ICD-10-CM | POA: Diagnosis not present

## 2019-11-22 DIAGNOSIS — Z8673 Personal history of transient ischemic attack (TIA), and cerebral infarction without residual deficits: Secondary | ICD-10-CM | POA: Insufficient documentation

## 2019-11-22 DIAGNOSIS — R2241 Localized swelling, mass and lump, right lower limb: Secondary | ICD-10-CM | POA: Diagnosis present

## 2019-11-22 DIAGNOSIS — I82411 Acute embolism and thrombosis of right femoral vein: Secondary | ICD-10-CM | POA: Insufficient documentation

## 2019-11-22 DIAGNOSIS — N184 Chronic kidney disease, stage 4 (severe): Secondary | ICD-10-CM

## 2019-11-22 DIAGNOSIS — Z7982 Long term (current) use of aspirin: Secondary | ICD-10-CM | POA: Diagnosis not present

## 2019-11-22 DIAGNOSIS — I10 Essential (primary) hypertension: Secondary | ICD-10-CM

## 2019-11-22 DIAGNOSIS — F17210 Nicotine dependence, cigarettes, uncomplicated: Secondary | ICD-10-CM | POA: Diagnosis not present

## 2019-11-22 LAB — CBC
HCT: 34.4 % — ABNORMAL LOW (ref 36.0–46.0)
Hemoglobin: 10.8 g/dL — ABNORMAL LOW (ref 12.0–15.0)
MCH: 29 pg (ref 26.0–34.0)
MCHC: 31.4 g/dL (ref 30.0–36.0)
MCV: 92.5 fL (ref 80.0–100.0)
Platelets: 356 10*3/uL (ref 150–400)
RBC: 3.72 MIL/uL — ABNORMAL LOW (ref 3.87–5.11)
RDW: 13.2 % (ref 11.5–15.5)
WBC: 7 10*3/uL (ref 4.0–10.5)
nRBC: 0 % (ref 0.0–0.2)

## 2019-11-22 LAB — BASIC METABOLIC PANEL
Anion gap: 10 (ref 5–15)
BUN: 47 mg/dL — ABNORMAL HIGH (ref 8–23)
CO2: 23 mmol/L (ref 22–32)
Calcium: 9 mg/dL (ref 8.9–10.3)
Chloride: 101 mmol/L (ref 98–111)
Creatinine, Ser: 3.05 mg/dL — ABNORMAL HIGH (ref 0.44–1.00)
GFR calc Af Amer: 17 mL/min — ABNORMAL LOW (ref >60)
GFR calc non Af Amer: 15 mL/min — ABNORMAL LOW (ref >60)
Glucose, Bld: 371 mg/dL — ABNORMAL HIGH (ref 70–99)
Potassium: 4.2 mmol/L (ref 3.5–5.1)
Sodium: 134 mmol/L — ABNORMAL LOW (ref 135–145)

## 2019-11-22 MED ORDER — APIXABAN 5 MG PO TABS
10.0000 mg | ORAL_TABLET | Freq: Two times a day (BID) | ORAL | Status: DC
  Administered 2019-11-22: 10 mg via ORAL
  Filled 2019-11-22: qty 2

## 2019-11-22 MED ORDER — APIXABAN (ELIQUIS) VTE STARTER PACK (10MG AND 5MG): ORAL_TABLET | ORAL | 0 refills | Status: DC

## 2019-11-22 MED ORDER — APIXABAN (ELIQUIS) EDUCATION KIT FOR DVT/PE PATIENTS
PACK | Freq: Once | Status: AC
  Filled 2019-11-22: qty 1

## 2019-11-22 MED ORDER — APIXABAN 5 MG PO TABS: 10.0000 mg | ORAL_TABLET | Freq: Once | ORAL | Status: DC

## 2019-11-22 MED ORDER — APIXABAN 5 MG PO TABS: 5.0000 mg | ORAL_TABLET | Freq: Two times a day (BID) | ORAL | Status: DC

## 2019-11-22 NOTE — ED Notes (Signed)
MD. Notified of elevated b/p. Patient is on b/p meds at home.

## 2019-11-22 NOTE — Progress Notes (Signed)
ANTICOAGULATION CONSULT NOTE - Initial Consult  Pharmacy Consult for Apixaban Indication: DVT  Allergies  Allergen Reactions  . Sulfa Antibiotics Other (See Comments)    Pt states she had "extreme pain"  . Sulfacetamide Sodium Other (See Comments)    Pain    Patient Measurements: Height: 5\' 3"  (160 cm) Weight: 73 kg (161 lb) IBW/kg (Calculated) : 52.4 Heparin Dosing Weight:   Vital Signs: Temp: 97.6 F (36.4 C) (04/06 2158) Temp Source: Oral (04/06 2158) BP: 215/85 (04/06 2158) Pulse Rate: 67 (04/06 2158)  Labs: Recent Labs    11/22/19 1603  HGB 10.8*  HCT 34.4*  PLT 356  CREATININE 3.05*    Estimated Creatinine Clearance: 16 mL/min (A) (by C-G formula based on SCr of 3.05 mg/dL (H)).   Medical History: Past Medical History:  Diagnosis Date  . Anxiety disorder   . Arthritis LOWER BACK  . Asthma   . Bipolar 1 disorder ([place])   . Carpal tunnel syndrome   . Chronic kidney disease    stage 3  . Depression   . Diabetes mellitus    type 2  . Dyslipidemia   . Dyspnea    with much activity  . Gait disorder   . GERD (gastroesophageal reflux disease)   . Gout LEFT FOOT-  STABLE  . H/O hiatal hernia   . History of CVA (cerebrovascular accident) FOUND PER MRI 1994--  RESIDUAL MEMORY IMPAIRED  . Hypercholesteremia   . Hypertension   . Memory difficulty 08/14/2016  . Neuropathy of lower extremity   . OCD (obsessive compulsive disorder)   . Peripheral neuropathy   . SOB (shortness of breath) on exertion   . Stroke ([place])    2000 memory loss   . SUI (stress urinary incontinence, female)   . Vulva cancer ([place]) 07/07/2012  . Vulvar lesion     Medications:  Scheduled:  . apixaban   Does not apply Once  . apixaban  10 mg Oral BID   Followed by  . [START ON 11/29/2019] apixaban  5 mg Oral BID    Assessment: Patient is a 5 yof that has a hx of CKD diagnosed with a new DVT. Pharmacy has been asked to dose Apixaban in this patient.   Goal of Therapy:    Monitor platelets by anticoagulation protocol: Yes   Plan:  - CrCl > 15, Scr slightly above baseline at this time  - Apixaban 10mg  PO BID x 7 days followed by Apixaban 5mg  PO BID  - Patient was educated about s/s of bleeding and how to take this medication  Duanne Limerick PharmD. BCPS  11/22/2019,10:55 PM

## 2019-11-22 NOTE — ED Triage Notes (Signed)
Pt arrives to ED from home with complaints of swelling in her right leg for months.Patient had a ultrasound on her leg today which showed "small amount of nonocclusive thrombus at distal RIGHT femoral vein." Patient denies SOB or CP. Pulses 2+ in both feet.
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
## 2019-11-29 ENCOUNTER — Ambulatory Visit: Payer: PPO | Admitting: Internal Medicine

## 2019-11-29 DIAGNOSIS — D6859 Other primary thrombophilia: Secondary | ICD-10-CM | POA: Insufficient documentation

## 2019-11-29 DIAGNOSIS — D6869 Other thrombophilia: Secondary | ICD-10-CM | POA: Insufficient documentation

## 2019-11-29 NOTE — Progress Notes (Signed)
RESCHEDULED

## 2019-11-30 ENCOUNTER — Other Ambulatory Visit: Payer: Self-pay | Admitting: Internal Medicine

## 2019-12-01 ENCOUNTER — Ambulatory Visit (INDEPENDENT_AMBULATORY_CARE_PROVIDER_SITE_OTHER): Payer: HMO | Admitting: Internal Medicine

## 2019-12-01 ENCOUNTER — Other Ambulatory Visit: Payer: Self-pay

## 2019-12-01 VITALS — BP 124/82 | HR 64 | Temp 96.3°F | Resp 16 | Ht 63.0 in | Wt 155.6 lb

## 2019-12-01 DIAGNOSIS — N184 Chronic kidney disease, stage 4 (severe): Secondary | ICD-10-CM

## 2019-12-01 DIAGNOSIS — Z79899 Other long term (current) drug therapy: Secondary | ICD-10-CM

## 2019-12-01 DIAGNOSIS — I10 Essential (primary) hypertension: Secondary | ICD-10-CM | POA: Diagnosis not present

## 2019-12-01 DIAGNOSIS — E559 Vitamin D deficiency, unspecified: Secondary | ICD-10-CM

## 2019-12-01 DIAGNOSIS — E785 Hyperlipidemia, unspecified: Secondary | ICD-10-CM

## 2019-12-01 DIAGNOSIS — E0822 Diabetes mellitus due to underlying condition with diabetic chronic kidney disease: Secondary | ICD-10-CM | POA: Diagnosis not present

## 2019-12-01 DIAGNOSIS — Z91199 Patient's noncompliance with other medical treatment and regimen due to unspecified reason: Secondary | ICD-10-CM

## 2019-12-01 DIAGNOSIS — I82411 Acute embolism and thrombosis of right femoral vein: Secondary | ICD-10-CM | POA: Diagnosis not present

## 2019-12-01 DIAGNOSIS — Z794 Long term (current) use of insulin: Secondary | ICD-10-CM

## 2019-12-01 DIAGNOSIS — E1169 Type 2 diabetes mellitus with other specified complication: Secondary | ICD-10-CM | POA: Diagnosis not present

## 2019-12-01 DIAGNOSIS — Z9119 Patient's noncompliance with other medical treatment and regimen: Secondary | ICD-10-CM

## 2019-12-01 DIAGNOSIS — E039 Hypothyroidism, unspecified: Secondary | ICD-10-CM

## 2019-12-01 NOTE — Patient Instructions (Signed)
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
## 2019-12-02 LAB — CBC WITH DIFFERENTIAL/PLATELET
Absolute Monocytes: 531 cells/uL (ref 200–950)
Basophils Absolute: 38 cells/uL (ref 0–200)
Basophils Relative: 0.6 %
Eosinophils Absolute: 262 cells/uL (ref 15–500)
Eosinophils Relative: 4.1 %
HCT: 33 % — ABNORMAL LOW (ref 35.0–45.0)
Hemoglobin: 10.3 g/dL — ABNORMAL LOW (ref 11.7–15.5)
Lymphs Abs: 1184 cells/uL (ref 850–3900)
MCH: 28.5 pg (ref 27.0–33.0)
MCHC: 31.2 g/dL — ABNORMAL LOW (ref 32.0–36.0)
MCV: 91.4 fL (ref 80.0–100.0)
MPV: 9.3 fL (ref 7.5–12.5)
Monocytes Relative: 8.3 %
Neutro Abs: 4384 cells/uL (ref 1500–7800)
Neutrophils Relative %: 68.5 %
Platelets: 363 10*3/uL (ref 140–400)
RBC: 3.61 10*6/uL — ABNORMAL LOW (ref 3.80–5.10)
RDW: 12.9 % (ref 11.0–15.0)
Total Lymphocyte: 18.5 %
WBC: 6.4 10*3/uL (ref 3.8–10.8)

## 2019-12-02 LAB — COMPLETE METABOLIC PANEL WITH GFR
AG Ratio: 1.2 (calc) (ref 1.0–2.5)
ALT: 11 U/L (ref 6–29)
AST: 13 U/L (ref 10–35)
Albumin: 3.2 g/dL — ABNORMAL LOW (ref 3.6–5.1)
Alkaline phosphatase (APISO): 42 U/L (ref 37–153)
BUN/Creatinine Ratio: 14 (calc) (ref 6–22)
BUN: 43 mg/dL — ABNORMAL HIGH (ref 7–25)
CO2: 28 mmol/L (ref 20–32)
Calcium: 9.2 mg/dL (ref 8.6–10.4)
Chloride: 101 mmol/L (ref 98–110)
Creat: 3.09 mg/dL — ABNORMAL HIGH (ref 0.60–0.93)
GFR, Est African American: 17 mL/min/{1.73_m2} — ABNORMAL LOW (ref >=60)
GFR, Est Non African American: 14 mL/min/{1.73_m2} — ABNORMAL LOW (ref >=60)
Globulin: 2.7 g/dL (calc) (ref 1.9–3.7)
Glucose, Bld: 202 mg/dL — ABNORMAL HIGH (ref 65–99)
Potassium: 4.7 mmol/L (ref 3.5–5.3)
Sodium: 135 mmol/L (ref 135–146)
Total Bilirubin: 0.2 mg/dL (ref 0.2–1.2)
Total Protein: 5.9 g/dL — ABNORMAL LOW (ref 6.1–8.1)

## 2019-12-02 LAB — LIPID PANEL
Cholesterol: 131 mg/dL (ref <200)
HDL: 41 mg/dL — ABNORMAL LOW (ref >=50)
LDL Cholesterol (Calc): 64 mg/dL (calc)
Non-HDL Cholesterol (Calc): 90 mg/dL (calc) (ref <130)
Total CHOL/HDL Ratio: 3.2 (calc) (ref <5.0)
Triglycerides: 190 mg/dL — ABNORMAL HIGH (ref <150)

## 2019-12-02 LAB — HEMOGLOBIN A1C
Hgb A1c MFr Bld: 8.2 % of total Hgb — ABNORMAL HIGH (ref <5.7)
Mean Plasma Glucose: 189 (calc)
eAG (mmol/L): 10.4 (calc)

## 2019-12-02 LAB — VITAMIN D 25 HYDROXY (VIT D DEFICIENCY, FRACTURES): Vit D, 25-Hydroxy: 26 ng/mL — ABNORMAL LOW (ref 30–100)

## 2019-12-02 LAB — TSH: TSH: 1.77 mIU/L (ref 0.40–4.50)

## 2019-12-02 LAB — INSULIN, RANDOM: Insulin: 19.5 u[IU]/mL

## 2019-12-02 LAB — MAGNESIUM: Magnesium: 3.4 mg/dL — ABNORMAL HIGH (ref 1.5–2.5)

## 2019-12-04 ENCOUNTER — Other Ambulatory Visit: Payer: Self-pay | Admitting: Internal Medicine

## 2019-12-04 DIAGNOSIS — N185 Chronic kidney disease, stage 5: Secondary | ICD-10-CM

## 2019-12-04 DIAGNOSIS — E1122 Type 2 diabetes mellitus with diabetic chronic kidney disease: Secondary | ICD-10-CM

## 2019-12-04 DIAGNOSIS — Z794 Long term (current) use of insulin: Secondary | ICD-10-CM

## 2019-12-05 ENCOUNTER — Encounter: Payer: Self-pay | Admitting: Internal Medicine

## 2019-12-14 ENCOUNTER — Other Ambulatory Visit: Payer: Self-pay | Admitting: *Deleted

## 2019-12-14 NOTE — Patient Outreach (Signed)
  [facility]) Care Management Chronic Special Needs Program    12/14/2019  Name: Ashley [name], DOB: November 12, 1945  MRN: [id number]   Ashley [name] is enrolled in a chronic special needs plan for Diabetes.  Outreach call to client for initial telephone assessment, no answer to [phone], left voicemail requesting return phone call, [phone], someone answered and did not speak.    PLAN Outreach client in 2-3 weeks  Jacqlyn Larsen [facility], [place] Coordinator [phone]

## 2019-12-22 ENCOUNTER — Other Ambulatory Visit: Payer: Self-pay | Admitting: Internal Medicine

## 2019-12-22 MED ORDER — APIXABAN 2.5 MG PO TABS: ORAL_TABLET | ORAL | 1 refills | Status: DC

## 2019-12-27 ENCOUNTER — Telehealth: Payer: Self-pay

## 2019-12-27 ENCOUNTER — Other Ambulatory Visit: Payer: Self-pay | Admitting: Physician Assistant

## 2019-12-27 ENCOUNTER — Other Ambulatory Visit: Payer: Self-pay | Admitting: *Deleted

## 2019-12-27 MED ORDER — POLYETHYLENE GLYCOL 3350 17 G PO PACK: 17.0000 g | PACK | Freq: Every day | ORAL | 3 refills | Status: DC

## 2019-12-27 MED ORDER — DOCUSATE SODIUM 100 MG PO CAPS: 100.0000 mg | ORAL_CAPSULE | Freq: Two times a day (BID) | ORAL | 1 refills | Status: DC

## 2019-12-27 NOTE — Telephone Encounter (Signed)
-----   Message from Vicie Mutters, [place] sent at 12/27/2019 12:50 PM EDT ----- Regarding: RE: OFFICE NOTE Contact: [phone] Suggest miralax daily and stool softner,  sent into pharmacy Please go to the ER if you have any severe AB pain, unable to hold down food/water, blood in stool or vomit, chest pain, shortness of breath, or any worsening symptoms.  ----- Message ----- From: Elenor Quinones, CMA Sent: 12/27/2019  12:35 PM EDT To: Vicie Mutters, PA-C Subject: OFFICE NOTE                                    TROUBLE w/having a BM & would like you send her something into her pharmacy.   [facility] [address]    Please & thank you

## 2019-12-27 NOTE — Patient Outreach (Signed)
[facility]) Care Management Chronic Special Needs Program  12/27/2019  Name: Ashley [name] DOB: 1946-02-16  MRN: [id number]  Ms. Ashley [name] is enrolled in a chronic special needs plan for Diabetes. Chronic Care Management Coordinator telephoned client to review health risk assessment and to develop individualized care plan.  Introduced the chronic care management program, importance of client participation, and taking their care plan to all provider appointments and inpatient facilities.  Reviewed the transition of care process and possible referral to community care management.  Subjective: Client states "I can't do this, I'm going on dialysis and this is all too much"  Client states she does not want any phone calls but is receptive to receiving care plan and education materials.  Client apologized and states she is too overwhelmed.  RN care manager unable to complete assessments and client did not state any personal goals.    Goals Addressed            This Visit's Progress   . Client understands the importance of follow-up with providers by attending scheduled visits       Review of medical record indicates client has completed  5 office visits with primary care provider in 2020 and 2 office visits in 2021 Call and schedule a yearly physical and follow up visit as recommended by your health care provider.     . Client will not report change from baseline and no repeated symptoms of stroke with in the next 6  months       Please call 911 for any signs/ symptoms of a stroke such as trouble walking, speaking and understanding, facial drooping, paralysis or numbness of the face, arm, or leg. Take your medication as prescribed and follow up with your doctor as recommended Use 24 hour nurse line at [phone] as needed RN care manager mailed EMMI education article "Preventing stroke and TIA"    . Client will report improved coping by continuing to take medications for  depression.       Continue to follow up with your doctor and talk with doctor about any worsening symptoms or overall management of depression Please take all medications as prescribed    . Client will report no fall or injuries in the next 6 months.       Please use assistive devices such as cane or walker as needed and per doctor recommendation Keep pathways clear, remove scatter rugs and any loose cords, etc. Always ask for assistance as needed RN care manager mailed EMMI education article "Preventing falls"    . Client will use Assistive Devices as needed and verbalize understanding of device use       Please use cane and walker as ordered RN care manager mailed EMMI education article " How to use a cane"  " How to use a walker"     . Client will verbalize knowledge of lung disease as evidenced by no ED visits or Inpatient stays related to lung disease       Follow up with Radiation Oncologist at the [facility] for scheduled appointments.      . Client will verbalize knowledge of self management of Hypertension as evidences by BP reading of 140/90 or less; or as defined by provider       Take blood pressure medications as prescribed. Plan to check blood pressure regularly and take results to your health care provider appointments. Plan to follow a low salt diet. Increase activity as tolerated. Follow  up with your health care provider as recommended. Please ask your health care provider " what is my target blood pressure range". Blood pressure 12/01/19  124/82    . HEMOGLOBIN A1C < 7.0       Per medical record review Hgb AIC completed on 09/30/19  8.0 Diabetes self management actions as follows- Continue to keep your follow up appointments with your provider and have lab work completed as recommended. Monitor glucose (blood sugar) per health care provider recommendation. Check feet daily Visit health care provider every 3-6 months as directed. It is important to have your  Hgb AIC checked every 3-6 months (every 6 months if you are at goal and every 3 months if you are not at goal). Eye exam yearly Carbohydrate controlled meal planning Take diabetes medications as prescribed by health care provider Physical activity RN care manager mailed EMMI education article "Diabetes and Diet"    . Maintain timely refills of diabetic medication as prescribed within the year .       Take medications as prescribed. Follow up with your health care provider if you have any questions. Please contact your assigned RN care manager if you have difficulty obtaining medications.     . Obtain annual  Lipid Profile, LDL-C       Per medical record review, Lipid profile completed on 09/30/19 LDL= 88 The goal for LDL is less than 70mg /dl as you are at high risk for complications. Try to avoid saturated fats, trans-fats and eat more fiber. Continue to follow up with your health care provider for any needed lab work.     . Obtain Annual Foot Exam   On track    Client states provider checks feet 2 times per year. Per medical record review, foot exam completed on 05/23/19 Your doctor should check your feet at least once a year. Plan to schedule a foot exam with your health care provider once every year. Check your skin and feet daily for cuts, bruises, redness, blisters or sore.     . Obtain annual screen for micro albuminuria (urine) , nephropathy (kidney problems)   On track    Per medical record review, microalbuminuria completed on 05/24/19 Continue to obtain yearly physicals and lab checks as recommended by your health care provider. It is important for your health care provider to check your urine for protein at least once every year.     . Obtain Hemoglobin A1C at least 2 times per year       Client reports AIC checked twice yearly    . Visit Primary Care Provider or Endocrinologist at least 2 times per year    On track    Client is seeing primary care provider regularly         Plan:  RN care manager faxed today's note with updated individualized care plan to primary care provider, mailed updated individualized care plan with EMMI education articles, HTA calendar and 24 hour nurse line magnet to client's home.  Chronic care management coordination will outreach in:  6 months     Kassie Mends Nursing/RN [place] Case Manager, C-SNP  [phone]

## 2019-12-27 NOTE — Telephone Encounter (Signed)
Patient has been made aware of Rx that was sent

## 2019-12-29 ENCOUNTER — Telehealth: Payer: Self-pay

## 2019-12-29 NOTE — Telephone Encounter (Signed)
Patient has been made aware of how to take [facility]. & pt voiced understanding & agreed

## 2019-12-29 NOTE — Telephone Encounter (Signed)
-----   Message from Vicie Mutters, [place] sent at 12/29/2019  8:08 AM EDT ----- Regarding: RE: OFFICE CALL Patient can take it. Mainly just watch out for dehydration if you get diarrhea.  Estill Bamberg ----- Message ----- From: Elenor Quinones, CMA Sent: 12/28/2019   9:12 AM EDT To: Vicie Mutters, PA-C Subject: OFFICE CALL                                    MIRALAX: states not to take if you have kidney failure.  Pt has stage 5 kidney failure & wants to know if you still want her to take it. I told the patient NOT to use at this time until I spoke with the provider.   Please & thank you

## 2020-01-08 ENCOUNTER — Other Ambulatory Visit: Payer: Self-pay | Admitting: Internal Medicine

## 2020-01-08 DIAGNOSIS — Z79899 Other long term (current) drug therapy: Secondary | ICD-10-CM

## 2020-01-08 DIAGNOSIS — E876 Hypokalemia: Secondary | ICD-10-CM

## 2020-01-20 ENCOUNTER — Telehealth: Payer: Self-pay | Admitting: *Deleted

## 2020-01-20 NOTE — Telephone Encounter (Signed)
CALLED PATIENT TO INFORM THAT FU APPT. HAS BEEN MOVED TO 02-02-20 DUE TO SCAN NOT BEING UNTIL 01-26-20, PATIENT AGREED TO NEW DATE AND TIME

## 2020-01-23 ENCOUNTER — Ambulatory Visit: Payer: HMO | Admitting: Radiation Oncology

## 2020-01-24 ENCOUNTER — Telehealth: Payer: Self-pay

## 2020-01-24 NOTE — Telephone Encounter (Signed)
Patient called asking where she is having her Chest CT on 6/10. Pt. Advised at [facility] and to go to the registration or information desk and they will check her in or send her to the correct desk. Advised to come a few min. early . Pt. Verbalized understanding.

## 2020-01-26 ENCOUNTER — Ambulatory Visit (HOSPITAL_COMMUNITY)
Admission: RE | Admit: 2020-01-26 | Discharge: 2020-01-26 | Disposition: A | Discharge status: Home / Self Care | Payer: HMO | Source: Ambulatory Visit | Attending: Radiation Oncology | Admitting: Radiation Oncology

## 2020-01-26 DIAGNOSIS — R911 Solitary pulmonary nodule: Secondary | ICD-10-CM | POA: Diagnosis not present

## 2020-01-27 ENCOUNTER — Other Ambulatory Visit: Payer: Self-pay | Admitting: Internal Medicine

## 2020-01-31 ENCOUNTER — Telehealth: Payer: Self-pay

## 2020-01-31 ENCOUNTER — Telehealth: Payer: Self-pay | Admitting: Radiation Oncology

## 2020-01-31 DIAGNOSIS — R911 Solitary pulmonary nodule: Secondary | ICD-10-CM

## 2020-01-31 NOTE — Telephone Encounter (Signed)
Patient informed of good results on recent chest CT scan; will order another chest CT scan for follow-up in 6 months.  Patient unable to come in light of her other medical issues.

## 2020-01-31 NOTE — Telephone Encounter (Signed)
Patient called stating she has had some phone problems. She would like Dr. [name] to  Call her back with the results of her CXR from 6/10.  Tel # [phone]

## 2020-01-31 NOTE — Telephone Encounter (Signed)
Patient called stating she cannot keep her appointment on 6/17 as she has an appointment re: her Cataracts. She states she would like Dr. [name] or his staff to call her with her CT results. Patient advised Dr. [name] will be advised of this and her prior call note is on his desk.

## 2020-02-01 ENCOUNTER — Other Ambulatory Visit: Payer: Self-pay | Admitting: Internal Medicine

## 2020-02-01 MED ORDER — ONDANSETRON 8 MG PO TBDP: ORAL_TABLET | ORAL | 0 refills | Status: DC

## 2020-02-02 ENCOUNTER — Ambulatory Visit: Payer: HMO | Admitting: Radiation Oncology

## 2020-02-07 ENCOUNTER — Other Ambulatory Visit: Payer: Self-pay | Admitting: Internal Medicine

## 2020-02-08 ENCOUNTER — Other Ambulatory Visit: Payer: Self-pay | Admitting: Internal Medicine

## 2020-02-08 DIAGNOSIS — I82409 Acute embolism and thrombosis of unspecified deep veins of unspecified lower extremity: Secondary | ICD-10-CM

## 2020-02-23 ENCOUNTER — Other Ambulatory Visit: Payer: HMO

## 2020-02-25 ENCOUNTER — Other Ambulatory Visit: Payer: Self-pay | Admitting: Physician Assistant

## 2020-02-27 ENCOUNTER — Telehealth: Payer: Self-pay | Admitting: *Deleted

## 2020-02-27 ENCOUNTER — Other Ambulatory Visit: Payer: Self-pay | Admitting: Internal Medicine

## 2020-02-27 MED ORDER — PROCHLORPERAZINE MALEATE 10 MG PO TABS: ORAL_TABLET | ORAL | 0 refills | Status: DC

## 2020-02-27 NOTE — Telephone Encounter (Signed)
Received a call from the patient's brother in regard to the patient's health. He asked that the patient receive a call from our office, since she refuses to call here. Spoke with the patient. She is having nausea, not relieved by Zofran. Dr Melford Aase will send a different RX for nausea. Also, she complained of constipation, even though she is taking Colace twice a day. She also states she is incontinent of liquid stools at times. She was advised to try a Dulcolax suppository and try a Fleets  Enema. She declined to schedule an office visit this week, but was strongly advised to keep her appointment 03/06/2020, with Irving Shows.

## 2020-03-05 ENCOUNTER — Other Ambulatory Visit: Payer: Self-pay | Admitting: Internal Medicine

## 2020-03-05 NOTE — Patient Instructions (Addendum)
Can do a nasal spary 1-2 sparys at night each nostril.  Remember to spray each nostril twice towards the outer part of your eye.   Do not sniff but instead pinch your nose and tilt your head back to help the medicine get into your sinuses not down your throat.   The best time to do this is at bedtime.  Stop if you get blurred vision or nose bleeds.   Stop the cinnamon Stop the zinc Stop the lamisil/terbinafine for now.   Stop these to see if this helps the nausea  Can use the nystatin cream for yeast or rash Can use triamcinolone for itching, irritation.   Please go to the ER if you have any severe AB pain, unable to hold down food/water, blood in stool or vomit, chest pain, shortness of breath, or any worsening symptoms.   If stomach not better may need to get CT scan.  The doxycycline may help the left arm but also may help your stomach.  Just wrap your arm as needed, wash with soap and water.    Increase water Get squatty potty Please do the following: Purchase a bottle of Miralax over the counter as well as a box of 5 mg dulcolax tablets. Take 4 dulcolax tablets. Wait 1 hour. You will then drink 6-8 capfuls of Miralax mixed in an adequate amount of water/juice/gatorade (you may choose which of these liquids to drink) over the next 2-3 hours. You should expect results within 1 to 6 hours after completing the bowel purge.  Then do on the miralax daily  Go to the er if you have severe AB pain, can not pass gas or stool in over 12 hours, can not hold down any food.  If not better follow up with GI doctor   Eye doctors that you can call, they are all very close to our office YOU ARE OVERDUE GET AN EYE EXAM- CAN BE WITH ANYONE  Dr. Katy Fitch [phone] Dr. Bing Plume [phone] Dr. Herbert Deaner [address]- this can be the last one  [facility]  7 a.m.-6:30 p.m., Monday 7 a.m.-5 p.m., Tuesday-Friday Schedule an  appointment by calling [phone].   Dining out: help on how to choose  It is better if you can meal plan and eat at your house but sometimes life happens so here are some tips to help you make healthier choices while eating out:  1) Ask for your server to pack up 1/2 of your meal to take home for lunch or later. Portion sizes are huge so if you don't have all of it in front of you, you are less likely to eat it all.    2) Ask if they have a smaller portion or lunch portion available, this is often cheaper as well!  3) Ask to not have the bread basket brought to the table  4) Ask for the dressing on the side.   5) Look at the nutrition menu BEFORE you go to a restaurant or fast food chain and have what you will eat in mind. You can also look it up on food tracking ups like Myfitness Pal or Lost it. However the web site for the restaurant is usually the best bet.   6) Don't forget that you are the costumer, it is okay to ask them to leave things out, ask about substituting, or ask them to cook with less oil!  Here is some more general information for you!

## 2020-03-05 NOTE — Progress Notes (Signed)
MEDICARE ANNUAL WELLNESS VISIT AND FOLLOW UP  Assessment:    COPD No triggers, well controlled symptoms, cont to monitor  Mild nonproliferative diabetic retinopathy of both eyes without macular edema associated with type 2 diabetes mellitus ([place]) Follow up eye doctor  Hyperlipidemia associated with type 2 diabetes mellitus ([place]) -     Lipid panel -     Hemoglobin A1c check lipids decrease fatty foods increase activity.   Diabetic polyneuropathy associated with type 2 diabetes mellitus (HCC) -     Hemoglobin A1c -     Ambulatory referral to Podiatry Will refer to podiatry for evaulation  Uncontrolled type 2 diabetes mellitus with hyperglycemia ([place]) -     Hemoglobin A1c Discussed general issues about diabetes pathophysiology and management., Educational material distributed., Suggested low cholesterol diet., Encouraged aerobic exercise., Discussed foot care., Reminded to get yearly retinal exam.  CKD stage 4 due to type 2 diabetes mellitus ([place]) -     COMPLETE METABOLIC PANEL WITH GFR -     Hemoglobin A1c -     Parathyroid Hormone, Intact w/Ca Continue follow up with nephrology  CKD (chronic kidney disease), stage IV (HCC) -     COMPLETE METABOLIC PANEL WITH GFR -     Parathyroid Hormone, Intact w/Ca Continue follow up with nephrology  Thrombophilia ([place]) -     CBC with Differential/Platelet monitor  Bipolar depression ([place]) Needs to follow up with Dr. [name] ASAP No SI/HI at this time  Severe episode of recurrent major depressive disorder, without psychotic features ([place]) Needs to follow up with Dr. [name] ASAP No SI/HI at this time  Long term (current) use of insulin ([place]) Monitor closley wit  Medicare annual wellness visit, subsequent Continue yearly  Essential hypertension Continue on current regiment Check blood pressure at home  Senile purpura ([place]) Monitor skin for changes Discussed safety with ASA  Peripheral arterial disease ([place]) Doing well at  this time LONG discussion need to control sugars but patient has very poor insight to disease  Gastroesophageal reflux disease, esophagitis presence not specified Doing well   Hypothyroidism, unspecified type Taking levothyroxine 13mcg daily  Other vitamin B12 deficiency anemia Continue supplement  Abnormality of gait Using four prong cane to assist with ambulation  Vitamin D deficiency Continue supplementation  Medication management  Idiopathic gout, unspecified chronicity, unspecified site No medications at this time  Non compliance w medication regimen Discussed this at length with patient Discussed that we can not help her if she is not willing to make it to appointments.  That her chronic conditions need to be addressed regularly and that we can not address every issue she has at one visit, she needs to come in more regularly   Frequent falls Discussed safety for patient in and out of the home  Memory difficulty continue to monitor  Skin abrasion -     doxycycline (VIBRAMYCIN) 100 MG capsule; Take 1 capsule twice daily with food Possible infection, will treat with ABX, dressed in the office Go to Er if symptoms worse  Constipation, unspecified constipation type -     polyethylene glycol (MIRALAX / GLYCOLAX) 17 g packet; Take 17 g by mouth daily. -     doxycycline (VIBRAMYCIN) 100 MG capsule; Take 1 capsule twice daily with food - need to increase water, move more, and follow up GI if not better  Rash -     nystatin cream (MYCOSTATIN); Apply 1 application topically 2 (two) times daily. -     triamcinolone  cream (KENALOG) 0.5 %; Apply 1 application topically 2 (two) times daily.  Toenail fungus -     Ambulatory referral to Podiatry - stop the lamisil- may be causing nausea  Urinary frequency -     Urinalysis, Routine w reflex microscopic -     Urine Culture  Over 60 minutes of exam, counseling, chart review and critical decision making was performed Future  Appointments  Date Time Provider [place]  06/05/2020  2:15 PM Kassie Mends, RN [facility] None  06/19/2020  3:00 PM Unk Pinto, MD [facility] None  08/02/2020  4:00 PM Gery Pray, MD [facility] None     Plan:   During the course of the visit the patient was educated and counseled about appropriate screening and preventive services including:    Pneumococcal vaccine   Prevnar 13  Influenza vaccine  Td vaccine  Screening electrocardiogram  Bone densitometry screening  Colorectal cancer screening  Diabetes screening  Glaucoma screening  Nutrition counseling   Advanced directives: requested   Subjective:  Ashley [name] is a 74 y.o. female who presents for Medicare Annual Wellness Visit and 3 month follow up.   She states she is unable to leave the house due to nausea and constipation. She states she has to strain with BM's, has had bleeding with it.   She admits to not drinking enough water and she does not move or walk.  She is off biotin,zinc.She is off magnesium.  Follows Dr. Ardis Hughs. Normal colonoscopy 09/2017  She has left toenail that is very long and thick, she is on lamisil for it on a month and off a month.   She had a fall backwards into a wall on Friday, no dizziness, just mechanical. She hit her left arm.    She is concentric with conversation and unable to give a direct answer to any type of questioning. She has history of HTN, HLD, DMII with nueropathy, hypothyroidism, depression, GERD, PAD, CKD, abnormal gait, vit D & B12 defciencies, and frequent falls. Lab Results  Component Value Date   [id number] 03/10/2019   She has had elevated blood pressure for 35 years. Her blood pressure has been controlled at home, today their BP is BP: 136/80.  Reports she did not take her blood pressure medications today. BMI is Body mass index is 27.1 kg/m., she is working on diet and exercise. Wt Readings from Last 3 Encounters:  03/06/20 153 lb  (69.4 kg)  12/01/19 155 lb 9.6 oz (70.6 kg)  11/22/19 161 lb (73 kg)   She follows with Dr. [name] for bipolar manic depressive disorder.  She does not leave her house, she is very non compliant with medications and lifestyle recommendations. She can not drive.  She uses a cane. Does her own meds other than the insulin.  She does not cook due to back pain, mainly eats out.   She does not workout. She denies chest pain, shortness of breath, dizziness.  She is on cholesterol medication and denies myalgias. Her cholesterol is not at goal. The cholesterol last visit was:   Lab Results  Component Value Date   CHOL 162 03/06/2020   HDL 61 03/06/2020   LDLCALC 79 03/06/2020   TRIG 121 03/06/2020   CHOLHDL 2.7 03/06/2020   She has had diabetes x 1998 CKD stage 4 she is on ARB- suppose to follow up with [place] [facility] Dr. Christie Nottingham  Hyperlipemia on lipitor Neuropathy has been off gabapentin retinopathy On insulin novlin 50/25- husband has  to do insulin shots for her but she manages the medication- does "sliding scale" She is off metformin due to kidney function She states morning sugars range from 120 to 200.    She has not been working on diet and exercise for diabetes, she continue to drink soda and eat fast food, she has very poor insight to disease process and very little motivation and denies foot ulcerations, hyperglycemia, hypoglycemia , nausea, paresthesia of the feet, polydipsia and polyuria.  Last A1C in the office was:  Lab Results  Component Value Date   HGBA1C 7.6 (H) 03/06/2020   Last GFR  Lab Results  Component Value Date   GFRNONAA 14 (L) 03/06/2020   Patient is on Vitamin D supplement.   Lab Results  Component Value Date   VD25OH 31 03/06/2020     She is on thyroid medication. Her medication was not changed last visit.   Lab Results  Component Value Date   TSH 1.37 03/06/2020  .   Medication Review:  Current Outpatient Medications (Endocrine & Metabolic):  .   glipiZIDE (GLUCOTROL) 5 MG tablet, TAKE 1 TABLET BY MOUTH 3 TIMES A DAY WITH MEALS FOR DIABETES .  levothyroxine (SYNTHROID) 88 MCG tablet, Take 1 tablet daily on an empty stomach with only water for 30 minutes & no Antacid meds, Calcium or Magnesium for 4 hours & avoid Biotin .  NOVOLIN N 100 UNIT/ML injection, INJECT 50 UNITS EVERY MORNING AND 25 UNITS EVERY EVENING (Patient taking differently: Inject 30-50 Units into the skin daily before breakfast. 130 or higher 30 units 200- 50 units)  Current Outpatient Medications (Cardiovascular):  .  amLODipine (NORVASC) 10 MG tablet, Take 1 tablet (10 mg total) by mouth daily. Marland Kitchen  atorvastatin (LIPITOR) 40 MG tablet, Take 1 tablet Daily for Cholesterol .  chlorthalidone (HYGROTON) 25 MG tablet, Take 25 mg by mouth daily. .  fenofibrate (TRICOR) 145 MG tablet, TAKE 1 TABLET DAILY FOR TRIGLYCERIDES (BLOOD FATS) .  hydrALAZINE (APRESOLINE) 10 MG tablet, Take 1 tablet (10 mg total) by mouth 3 (three) times daily. .  hydrochlorothiazide (MICROZIDE) 12.5 MG capsule, Take 2 capsules (25 mg total) by mouth daily.  Current Outpatient Medications (Respiratory):  .  azelastine (ASTELIN) 0.1 % nasal spray, Place 2 sprays into both nostrils 2 (two) times daily. Use in each nostril as directed  Current Outpatient Medications (Analgesics):  .  aspirin EC 81 MG tablet, Take 81 mg by mouth daily.  Current Outpatient Medications (Hematological):  .  apixaban (ELIQUIS) 2.5 MG TABS tablet, Take 1 tablet 2 x /day to Dissolve Blood Clots .  ferrous sulfate 325 (65 FE) MG EC tablet, Take 1 tab of ferrous sulfate (iron) twice daily by mouth with vitamin C. .  ferrous sulfate 325 (65 FE) MG tablet, TAKE 1 TABLET BY MOUTH TWICE A DAY WITH VITAMIN C  Current Outpatient Medications (Other):  Marland Kitchen  Cholecalciferol (VITAMIN D3) 25 MCG (1000 UT) CAPS, Take by mouth. .  clonazePAM (KLONOPIN) 1 MG tablet, Take 1 mg by mouth at bedtime.  .  docusate sodium (COLACE) 100 MG capsule,  TAKE 1 CAPSULE BY MOUTH TWICE A DAY .  famotidine (PEPCID) 20 MG tablet, TAKE 1 TABLET TWICE A DAY WITH MEALS FOR INDIGESTION. Marland Kitchen  FLUoxetine (PROZAC) 40 MG capsule, TAKE ONE CAPSULE BY MOUTH EVERY DAY (Patient taking differently: Take 40 mg by mouth daily. ) .  Multiple Vitamins-Minerals (MULTIVITAMIN WITH MINERALS) tablet, Take 1 tablet by mouth daily. .  ondansetron (ZOFRAN ODT)  8 MG disintegrating tablet, Dissolve 1/2 to 1 tablet under tongue every 6 hours as needed for Nausea or Vomitting .  ONETOUCH ULTRA test strip, TEST BLOOD SUGAR 3 TIMES A DAY .  polyethylene glycol (MIRALAX / GLYCOLAX) 17 g packet, Take 17 g by mouth daily. .  potassium chloride (KLOR-CON M10) 10 MEQ tablet, TAKE 1 TABLET 3 X /DAY WITH MEALS FOR POTASSIUM .  prochlorperazine (COMPAZINE) 10 MG tablet, Take 1 tablet every 4 to 6 hours (Maximum 3 tablets /day) if needed for Nausea or Vomiting .  vitamin C (ASCORBIC ACID) 500 MG tablet, Take 500 mg by mouth daily. Take with iron/ferrous sulfate .  doxycycline (VIBRAMYCIN) 100 MG capsule, Take 1 capsule twice daily with food .  nystatin cream (MYCOSTATIN), Apply 1 application topically 2 (two) times daily. Marland Kitchen  triamcinolone cream (KENALOG) 0.5 %, Apply 1 application topically 2 (two) times daily.   Allergies  Allergen Reactions  . Sulfa Antibiotics Other (See Comments)    Pt states she had "extreme pain"  . Sulfacetamide Sodium Other (See Comments)    Pain    Current Problems (verified) Patient Active Problem List   Diagnosis Date Noted  . Cancer ([place]) 03/07/2020  . History of lung cancer 03/07/2020  . Thrombophilia ([place]) 11/29/2019  . CKD (chronic kidney disease), stage IV ([place]) 06/30/2019  . Right lower lobe pulmonary nodule 06/24/2019  . Long term (current) use of insulin ([place]) 02/21/2019  . Obesity (BMI 30.0-34.9) 01/06/2018  . Diabetes mellitus type 2, uncontrolled ([place]) 12/17/2017  . Peripheral arterial disease ([place]) 10/20/2017  . Polyp of ascending colon    . Mild nonproliferative diabetic retinopathy of both eyes without macular edema associated with type 2 diabetes mellitus ([place]) 06/09/2017  . Senile purpura ([place]) 06/09/2017  . Hyperlipidemia associated with type 2 diabetes mellitus ([place]) 06/08/2017  . Memory difficulty 08/14/2016  . Frequent falls 04/08/2016  . Hypothyroidism 03/05/2016  . GERD (gastroesophageal reflux disease) 03/05/2016  . CKD stage 4 due to type 2 diabetes mellitus ([place]) 03/05/2016  . Gout 08/29/2015  . Non compliance w medication regimen 02/20/2015  . Bipolar depression ([place]) 11/06/2014  . Vitamin D deficiency 12/01/2013  . Medication management 12/01/2013  . Abnormality of gait 03/10/2013  . Carpal tunnel syndrome 10/25/2012  . Diabetic neuropathy ([place]) 10/25/2012  . Major depressive disorder, recurrent episode ([place]) 10/12/2009  . Essential hypertension 10/12/2009  . COPD 10/12/2009    Screening Tests Immunization History  Administered Date(s) Administered  . PFIZER SARS-COV-2 Vaccination 12/11/2019, 01/06/2020  . Pneumococcal Conjugate-13 07/26/2014  . Pneumococcal-Unspecified 11/16/2012  . Td 08/18/2008  . Zoster 10/03/2008   Health Maintenance  Topic Date Due  . TETANUS/TDAP  08/18/2018  . MAMMOGRAM  01/06/2020  . INFLUENZA VACCINE  03/18/2020  . FOOT EXAM  05/22/2020  . HEMOGLOBIN A1C  09/06/2020  . OPHTHALMOLOGY EXAM  03/06/2021  . COLONOSCOPY  10/01/2022  . DEXA SCAN  Completed  . COVID-19 Vaccine  Completed  . Hepatitis C Screening  Completed  . PNA vac Low Risk Adult  Completed    Preventative care: Last colonoscopy: 2019 Last mammogram: 01/05/2018 Last pap smear/pelvic exam: 2015   DEXA:2009 MRI/MRA: 2017 US soft tissue head: 2016  Prior vaccinations: TD or Tdap: 2010 Declines Influenza: Declines Pneumococcal: 2014 Prevnar13: 2015 Shingles/Zostavax: Declined  Names of Other Physician/Practitioners you currently use: 1. [facility] here  for primary care 2. Eye exam 12/2017 3. Dentist 2019 Patient Care Team: Unk Pinto, MD as PCP -  General (Internal Medicine) Chucky May, MD as Consulting Physician (Psychiatry) [name], MD as Attending Physician (Obstetrics and Gynecology) [name], MD as Attending Physician (Gastroenterology) Kassie Mends, RN as [place] [facility]  SURGICAL HISTORY She  has a past surgical history that includes Bladder suspension (1996); NASAL AND FACIAL SURGERY (1985); Vaginal hysterectomy (1985); Vulvectomy partial (DEC 1999); Vulvar lesion removal (06/22/2012); Colonoscopy with propofol (N/A, 10/01/2017); Video bronchoscopy with endobronchial navigation (N/A, 06/24/2019); and Fuducial placement (N/A, 06/24/2019). FAMILY HISTORY Her family history includes Heart disease in her father; Heart failure in her father; Hyperlipidemia in her father and mother; Hypertension in her brother and father; Stroke in her father and mother. SOCIAL HISTORY She  reports that she has been smoking cigarettes. She has a 54.00 pack-year smoking history. She has never used smokeless tobacco. She reports that she does not drink alcohol and does not use drugs.   MEDICARE WELLNESS OBJECTIVES: Physical activity: Current Exercise Habits: The patient does not participate in regular exercise at present, Exercise limited by: orthopedic condition(s);Other - see comments Cardiac risk factors: Cardiac Risk Factors include: advanced age (>65men, >92 women);diabetes mellitus;dyslipidemia;hypertension;obesity (BMI >30kg/m2);sedentary lifestyle;smoking/ tobacco exposure Depression/mood screen:   Depression screen [facility] 2/9 03/07/2020  Decreased Interest 1  Down, Depressed, Hopeless 1  PHQ - 2 Score 2  Altered sleeping 1  Tired, decreased energy 1  Change in appetite 1  Feeling bad or failure about yourself  0  Trouble concentrating 0  Moving slowly or fidgety/restless 0  Suicidal thoughts 0    PHQ-9 Score 5  Difficult doing work/chores Somewhat difficult  Some recent data might be hidden    ADLs:  In your present state of health, do you have any difficulty performing the following activities: 03/07/2020 06/30/2019  Hearing? N Y  Vision? Y Y  Difficulty concentrating or making decisions? Tempie Donning  Walking or climbing stairs? Y Y  Dressing or bathing? N N  Doing errands, shopping? Y Y  Some recent data might be hidden     Cognitive Testing  Alert? Yes  Normal Appearance?Yes  Oriented to person? Yes  Place? Yes   Time? Yes  Recall of three objects?  1/3  EOL planning: Does Patient Have a Medical Advance Directive?: Yes Type of Advance Directive: Healthcare Power of Attorney, Living will [place] in Chart?: No - copy requested  Review of Systems  Constitutional: Positive for malaise/fatigue. Negative for chills, diaphoresis, fever and weight loss.  HENT: Negative for congestion, ear discharge, ear pain, hearing loss, nosebleeds, sinus pain, sore throat and tinnitus.   Eyes: Negative for blurred vision, double vision, photophobia, pain, discharge and redness.  Respiratory: Negative for cough, hemoptysis, sputum production, shortness of breath, wheezing and stridor.   Cardiovascular: Positive for leg swelling. Negative for chest pain, palpitations, orthopnea, claudication and PND.  Gastrointestinal: Positive for constipation and nausea. Negative for abdominal pain, blood in stool, diarrhea, heartburn, melena and vomiting.  Genitourinary: Positive for frequency. Negative for dysuria, flank pain, hematuria and urgency.       Notes incontinence of bladder with some bowel leakage.  No change from last visit.  Musculoskeletal: Positive for back pain, falls, joint pain and myalgias. Negative for neck pain.  Skin: Positive for rash. Negative for itching.  Neurological: Positive for sensory change. Negative for dizziness, tingling, tremors, speech change,  focal weakness, seizures, loss of consciousness, weakness and headaches.  Psychiatric/Behavioral: Positive for depression and memory loss. Negative for hallucinations, substance abuse  and suicidal ideas. The patient is not nervous/anxious and does not have insomnia.      Objective:     Today's Vitals   03/06/20 1553  BP: 136/80  Pulse: 80  Temp: (!) 97.2 F (36.2 C)  SpO2: 98%  Weight: 153 lb (69.4 kg)  PainSc: 8    Body mass index is 27.1 kg/m. General Appearance: Obese,elderly female in no apparent distress. Eyes: PERRLA, EOMs, conjunctiva no swelling or erythema ENT/Mouth: Mask in place; oral exam deferred. Hearing decreased Neck: Supple Respiratory: Respiratory effort normal, BS equal bilaterally without rales, rhonchi, wheezing or stridor.  Cardio: RRR with no MRGs. Intact peripheral pulses with bilateral ankle and pedal edema, right leg edema worse than left.  Abdomen: Soft, + BS.  + diffuse tender, no guarding, rebound, hernias, masses. Lymphatics: Non tender without lymphadenopathy.  Musculoskeletal: No obvious deformity; in wheelchair, gait not assessed Skin: Warm, dry without rashes, lesions, ecchymosis. Left toenail with thickening. Left arm with abrasion with some exudate and surrounding erythema.  Neuro:  Normal muscle tone, Sensation intact.  Psych: Awake and oriented X 3, normal affect, wandering through process, Insight and Judgment is poor       Medicare Attestation I have personally reviewed: The patient's medical and social history Their use of alcohol, tobacco or illicit drugs Their current medications and supplements The patient's functional ability including ADLs,fall risks, home safety risks, cognitive, and hearing and visual impairment Diet and physical activities Evidence for depression or mood disorders  The patient's weight, height, BMI, and visual acuity have been recorded in the chart.  I have made referrals, counseling, and provided  education to the patient based on review of the above and I have provided the patient with a written personalized care plan for preventive services.     Vicie Mutters, PA-C   03/07/2020

## 2020-03-06 ENCOUNTER — Ambulatory Visit (INDEPENDENT_AMBULATORY_CARE_PROVIDER_SITE_OTHER): Payer: HMO | Admitting: Physician Assistant

## 2020-03-06 ENCOUNTER — Encounter: Payer: Self-pay | Admitting: Physician Assistant

## 2020-03-06 ENCOUNTER — Other Ambulatory Visit: Payer: Self-pay

## 2020-03-06 VITALS — BP 136/80 | HR 80 | Temp 97.2°F | Wt 153.0 lb

## 2020-03-06 DIAGNOSIS — E1169 Type 2 diabetes mellitus with other specified complication: Secondary | ICD-10-CM

## 2020-03-06 DIAGNOSIS — E66811 Obesity, class 1: Secondary | ICD-10-CM

## 2020-03-06 DIAGNOSIS — I10 Essential (primary) hypertension: Secondary | ICD-10-CM

## 2020-03-06 DIAGNOSIS — E039 Hypothyroidism, unspecified: Secondary | ICD-10-CM

## 2020-03-06 DIAGNOSIS — E113293 Type 2 diabetes mellitus with mild nonproliferative diabetic retinopathy without macular edema, bilateral: Secondary | ICD-10-CM | POA: Diagnosis not present

## 2020-03-06 DIAGNOSIS — I739 Peripheral vascular disease, unspecified: Secondary | ICD-10-CM

## 2020-03-06 DIAGNOSIS — R296 Repeated falls: Secondary | ICD-10-CM

## 2020-03-06 DIAGNOSIS — E559 Vitamin D deficiency, unspecified: Secondary | ICD-10-CM

## 2020-03-06 DIAGNOSIS — Z794 Long term (current) use of insulin: Secondary | ICD-10-CM

## 2020-03-06 DIAGNOSIS — R6889 Other general symptoms and signs: Secondary | ICD-10-CM

## 2020-03-06 DIAGNOSIS — K219 Gastro-esophageal reflux disease without esophagitis: Secondary | ICD-10-CM

## 2020-03-06 DIAGNOSIS — R413 Other amnesia: Secondary | ICD-10-CM

## 2020-03-06 DIAGNOSIS — E1122 Type 2 diabetes mellitus with diabetic chronic kidney disease: Secondary | ICD-10-CM

## 2020-03-06 DIAGNOSIS — J438 Other emphysema: Secondary | ICD-10-CM | POA: Diagnosis not present

## 2020-03-06 DIAGNOSIS — D692 Other nonthrombocytopenic purpura: Secondary | ICD-10-CM

## 2020-03-06 DIAGNOSIS — M1000 Idiopathic gout, unspecified site: Secondary | ICD-10-CM

## 2020-03-06 DIAGNOSIS — Z85118 Personal history of other malignant neoplasm of bronchus and lung: Secondary | ICD-10-CM

## 2020-03-06 DIAGNOSIS — Z0000 Encounter for general adult medical examination without abnormal findings: Secondary | ICD-10-CM

## 2020-03-06 DIAGNOSIS — Z0001 Encounter for general adult medical examination with abnormal findings: Secondary | ICD-10-CM

## 2020-03-06 DIAGNOSIS — E1142 Type 2 diabetes mellitus with diabetic polyneuropathy: Secondary | ICD-10-CM

## 2020-03-06 DIAGNOSIS — E669 Obesity, unspecified: Secondary | ICD-10-CM

## 2020-03-06 DIAGNOSIS — Z9114 Patient's other noncompliance with medication regimen: Secondary | ICD-10-CM

## 2020-03-06 DIAGNOSIS — F319 Bipolar disorder, unspecified: Secondary | ICD-10-CM

## 2020-03-06 DIAGNOSIS — F332 Major depressive disorder, recurrent severe without psychotic features: Secondary | ICD-10-CM

## 2020-03-06 DIAGNOSIS — D6859 Other primary thrombophilia: Secondary | ICD-10-CM

## 2020-03-06 DIAGNOSIS — N184 Chronic kidney disease, stage 4 (severe): Secondary | ICD-10-CM

## 2020-03-06 DIAGNOSIS — Z79899 Other long term (current) drug therapy: Secondary | ICD-10-CM

## 2020-03-06 DIAGNOSIS — R269 Unspecified abnormalities of gait and mobility: Secondary | ICD-10-CM

## 2020-03-06 DIAGNOSIS — Z91148 Patient's other noncompliance with medication regimen for other reason: Secondary | ICD-10-CM

## 2020-03-06 DIAGNOSIS — K5900 Constipation, unspecified: Secondary | ICD-10-CM

## 2020-03-06 DIAGNOSIS — R350 Frequency of micturition: Secondary | ICD-10-CM

## 2020-03-06 DIAGNOSIS — B351 Tinea unguium: Secondary | ICD-10-CM

## 2020-03-06 DIAGNOSIS — R21 Rash and other nonspecific skin eruption: Secondary | ICD-10-CM

## 2020-03-06 DIAGNOSIS — E785 Hyperlipidemia, unspecified: Secondary | ICD-10-CM

## 2020-03-06 DIAGNOSIS — E1165 Type 2 diabetes mellitus with hyperglycemia: Secondary | ICD-10-CM

## 2020-03-06 DIAGNOSIS — T148XXA Other injury of unspecified body region, initial encounter: Secondary | ICD-10-CM

## 2020-03-06 MED ORDER — AZELASTINE HCL 0.1 % NA SOLN: 2.0000 | Freq: Two times a day (BID) | NASAL | 2 refills | Status: DC

## 2020-03-06 MED ORDER — TRIAMCINOLONE ACETONIDE 0.5 % EX CREA: 1.0000 "application " | TOPICAL_CREAM | Freq: Two times a day (BID) | CUTANEOUS | 2 refills | Status: DC

## 2020-03-06 MED ORDER — NYSTATIN 100000 UNIT/GM EX CREA: 1.0000 "application " | TOPICAL_CREAM | Freq: Two times a day (BID) | CUTANEOUS | 1 refills | Status: DC

## 2020-03-06 MED ORDER — DOXYCYCLINE HYCLATE 100 MG PO CAPS: ORAL_CAPSULE | ORAL | 0 refills | Status: DC

## 2020-03-06 MED ORDER — POLYETHYLENE GLYCOL 3350 17 G PO PACK: 17.0000 g | PACK | Freq: Every day | ORAL | 3 refills | Status: AC

## 2020-03-07 ENCOUNTER — Encounter: Payer: Self-pay | Admitting: Physician Assistant

## 2020-03-07 DIAGNOSIS — Z85118 Personal history of other malignant neoplasm of bronchus and lung: Secondary | ICD-10-CM | POA: Insufficient documentation

## 2020-03-07 DIAGNOSIS — C801 Malignant (primary) neoplasm, unspecified: Secondary | ICD-10-CM | POA: Insufficient documentation

## 2020-03-07 LAB — PTH, INTACT AND CALCIUM
Calcium: 9.7 mg/dL (ref 8.6–10.4)
PTH: 44 pg/mL (ref 14–64)

## 2020-03-07 LAB — CBC WITH DIFFERENTIAL/PLATELET
Absolute Monocytes: 537 cells/uL (ref 200–950)
Basophils Absolute: 55 cells/uL (ref 0–200)
Basophils Relative: 0.6 %
Eosinophils Absolute: 91 cells/uL (ref 15–500)
Eosinophils Relative: 1 %
HCT: 36.5 % (ref 35.0–45.0)
Hemoglobin: 11.7 g/dL (ref 11.7–15.5)
Lymphs Abs: 1074 cells/uL (ref 850–3900)
MCH: 29 pg (ref 27.0–33.0)
MCHC: 32.1 g/dL (ref 32.0–36.0)
MCV: 90.6 fL (ref 80.0–100.0)
MPV: 9.6 fL (ref 7.5–12.5)
Monocytes Relative: 5.9 %
Neutro Abs: 7344 cells/uL (ref 1500–7800)
Neutrophils Relative %: 80.7 %
Platelets: 432 10*3/uL — ABNORMAL HIGH (ref 140–400)
RBC: 4.03 10*6/uL (ref 3.80–5.10)
RDW: 14 % (ref 11.0–15.0)
Total Lymphocyte: 11.8 %
WBC: 9.1 10*3/uL (ref 3.8–10.8)

## 2020-03-07 LAB — URINALYSIS, ROUTINE W REFLEX MICROSCOPIC
Bacteria, UA: NONE SEEN /HPF
Bilirubin Urine: NEGATIVE
Hgb urine dipstick: NEGATIVE
Hyaline Cast: NONE SEEN /LPF
Ketones, ur: NEGATIVE
Leukocytes,Ua: NEGATIVE
Nitrite: NEGATIVE
Specific Gravity, Urine: 1.017 (ref 1.001–1.03)
Squamous Epithelial / HPF: NONE SEEN /HPF (ref <=5)
pH: 6 (ref 5.0–8.0)

## 2020-03-07 LAB — URINE CULTURE
MICRO NUMBER:: 10727559
SPECIMEN QUALITY:: ADEQUATE

## 2020-03-07 LAB — COMPLETE METABOLIC PANEL WITH GFR
AG Ratio: 1.3 (calc) (ref 1.0–2.5)
ALT: 15 U/L (ref 6–29)
AST: 17 U/L (ref 10–35)
Albumin: 3.6 g/dL (ref 3.6–5.1)
Alkaline phosphatase (APISO): 45 U/L (ref 37–153)
BUN/Creatinine Ratio: 15 (calc) (ref 6–22)
BUN: 49 mg/dL — ABNORMAL HIGH (ref 7–25)
CO2: 24 mmol/L (ref 20–32)
Calcium: 9.7 mg/dL (ref 8.6–10.4)
Chloride: 101 mmol/L (ref 98–110)
Creat: 3.17 mg/dL — ABNORMAL HIGH (ref 0.60–0.93)
GFR, Est African American: 16 mL/min/{1.73_m2} — ABNORMAL LOW (ref >=60)
GFR, Est Non African American: 14 mL/min/{1.73_m2} — ABNORMAL LOW (ref >=60)
Globulin: 2.8 g/dL (calc) (ref 1.9–3.7)
Glucose, Bld: 332 mg/dL — ABNORMAL HIGH (ref 65–99)
Potassium: 3.6 mmol/L (ref 3.5–5.3)
Sodium: 135 mmol/L (ref 135–146)
Total Bilirubin: 0.2 mg/dL (ref 0.2–1.2)
Total Protein: 6.4 g/dL (ref 6.1–8.1)

## 2020-03-07 LAB — LIPID PANEL
Cholesterol: 162 mg/dL (ref <200)
HDL: 61 mg/dL (ref >=50)
LDL Cholesterol (Calc): 79 mg/dL (calc)
Non-HDL Cholesterol (Calc): 101 mg/dL (calc) (ref <130)
Total CHOL/HDL Ratio: 2.7 (calc) (ref <5.0)
Triglycerides: 121 mg/dL (ref <150)

## 2020-03-07 LAB — TSH: TSH: 1.37 mIU/L (ref 0.40–4.50)

## 2020-03-07 LAB — MAGNESIUM: Magnesium: 2.6 mg/dL — ABNORMAL HIGH (ref 1.5–2.5)

## 2020-03-07 LAB — VITAMIN D 25 HYDROXY (VIT D DEFICIENCY, FRACTURES): Vit D, 25-Hydroxy: 31 ng/mL (ref 30–100)

## 2020-03-07 LAB — HEMOGLOBIN A1C
Hgb A1c MFr Bld: 7.6 % of total Hgb — ABNORMAL HIGH (ref <5.7)
Mean Plasma Glucose: 171 (calc)
eAG (mmol/L): 9.5 (calc)

## 2020-03-12 ENCOUNTER — Telehealth: Payer: Self-pay

## 2020-03-12 NOTE — Telephone Encounter (Signed)
-----   Message from Vicie Mutters, [place] sent at 03/11/2020 12:37 PM EDT ----- Regarding: RE: MED/VISIT QUESTION Can talk next OV but was seen on CT scan of lungs.  Estill Bamberg ----- Message ----- From: Elenor Quinones, CMA Sent: 03/07/2020   4:48 PM EDT To: Vicie Mutters, PA-C Subject: MED/VISIT QUESTION                             PER Patient she has REQUESTED  a call from the provider.  From her AVS on yesterday it said she has COPD, pt states she never knew OR was ever told that she has COPD & would like the provider to call her & explain WHY she was never told this by her provider.

## 2020-03-12 NOTE — Telephone Encounter (Signed)
Unable to reach patient at this time, will try again later.
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
## 2020-03-17 ENCOUNTER — Other Ambulatory Visit: Payer: Self-pay | Admitting: Internal Medicine

## 2020-03-17 DIAGNOSIS — E039 Hypothyroidism, unspecified: Secondary | ICD-10-CM

## 2020-03-20 ENCOUNTER — Ambulatory Visit
Admission: RE | Admit: 2020-03-20 | Discharge: 2020-03-20 | Disposition: A | Discharge status: Home / Self Care | Payer: HMO | Source: Ambulatory Visit | Attending: Internal Medicine | Admitting: Internal Medicine

## 2020-03-20 DIAGNOSIS — I82409 Acute embolism and thrombosis of unspecified deep veins of unspecified lower extremity: Secondary | ICD-10-CM

## 2020-03-26 ENCOUNTER — Telehealth: Payer: Self-pay | Admitting: *Deleted

## 2020-03-26 NOTE — Telephone Encounter (Signed)
Spoke to patient, after receiving a call from Dr Caprice Red office, regarding changing from Eliquis to Coumadin. Patient states the Eliquis is too expensive. Per Dr Melford Aase, the patient will need an OV to discuss the medication change and start the dose. Patient has an appointment 03/29/2020 with Garnet Sierras, NP.

## 2020-03-26 NOTE — Progress Notes (Signed)
Assessment and Plan:  Ashley [name] was seen today for medication management.  Diagnoses and all orders for this visit:    Acute deep vein thrombosis (DVT) of femoral vein of right lower extremity (HCC) Thrombophilia (HCC) Continue Eliquis 2.5mg  BID, sample provided until referral complete. -     Ambulatory referral to Anticoagulation Monitoring  Chronic kidney disease (CKD), stage V (HCC) Increase fluids  Avoid NSAIDS Blood pressure control Monitor sugars  Will continue to monitor  Medication Management Continued   Further disposition pending results of labs. Discussed med's effects and SE's.   Over 30 minutes of face to face interview, exam, counseling, chart review, and critical decision making was performed.   Future Appointments  Date Time Provider [place]  04/05/2020  3:00 PM Wallene Huh, [place] [facility]  04/19/2020 11:30 AM Unk Pinto, [place] [facility] None  06/05/2020  2:15 PM Kassie Mends, RN [facility] None  06/19/2020  3:00 PM Unk Pinto, MD [facility] None  08/02/2020  4:00 PM Gery Pray, MD [facility] None    ------------------------------------------------------------------------------------------------------------------   HPI 74 y.o.female presents for evaluation of anticoagulation therapy.  She is currently on eliquis 2.5mg  BID for DVT of right femoral vein, nonocclusive on 11/22/19.  Patient has CKD Stage V and CrCl 13.  She follow with Dr [name], [facility] and was last seen on 02/01/20.  She had follow up imaging on 03/20/20 which showed no change in DVT from 11/22/19.  She would like to change medications from Eliquis to coumadin.  She reports she is not able to afford the eliquis.  She has about one week's worth of medications left for this.  We have samples to supplement until referral.  Discussed with patient and husband at length today.    Her husband goes the the Coumadin clinic on [address] for A-fib and see's [name]. 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  History of vulvar cancer (2013). No S or S of bleeding today.     Past Medical History:  Diagnosis Date  . Anxiety disorder   . Arthritis LOWER BACK  . Asthma   . Bipolar 1 disorder ([place])   . Carpal tunnel syndrome   . Chronic kidney disease    stage 3  . Depression   . Diabetes mellitus    type 2  . Dyslipidemia   . Dyspnea    with much activity  . Gait disorder   . GERD (gastroesophageal reflux disease)   . Gout LEFT FOOT-  STABLE  . H/O hiatal hernia   . History of CVA (cerebrovascular accident) FOUND PER MRI 1994--  RESIDUAL MEMORY IMPAIRED  . Hypercholesteremia   . Hypertension   . Memory difficulty 08/14/2016  . Neuropathy of lower extremity   . OCD (obsessive compulsive disorder)   . Peripheral neuropathy   . SOB (shortness of breath) on exertion   . Stroke ([place])    2000 memory loss   . SUI (stress urinary incontinence, female)   . Vulva cancer ([place]) 07/07/2012  . Vulvar lesion      Allergies  Allergen Reactions  . Sulfa Antibiotics Other (See Comments)    Pt states she had "extreme pain"  . Sulfacetamide  Sodium Other (See Comments)    Pain    Current Outpatient Medications on File Prior to Visit  Medication Sig  . amLODipine (NORVASC) 10 MG tablet Take 1 tablet (10 mg total) by mouth daily.  Marland Kitchen apixaban (ELIQUIS) 2.5 MG TABS tablet Take 1 tablet 2 x /day to Dissolve Blood Clots  . aspirin EC 81 MG tablet Take 81 mg by mouth daily.  Marland Kitchen atorvastatin (LIPITOR) 40 MG tablet Take 1 tablet Daily for Cholesterol  . azelastine (ASTELIN) 0.1 % nasal spray Place 2 sprays into both nostrils 2 (two) times daily. Use in  each nostril as directed  . chlorthalidone (HYGROTON) 25 MG tablet Take 25 mg by mouth daily.  . Cholecalciferol (VITAMIN D3) 25 MCG (1000 UT) CAPS Take by mouth.  . clonazePAM (KLONOPIN) 1 MG tablet Take 1 mg by mouth at bedtime.   . docusate sodium (COLACE) 100 MG capsule TAKE 1 CAPSULE BY MOUTH TWICE A DAY  . doxycycline (VIBRAMYCIN) 100 MG capsule Take 1 capsule twice daily with food  . famotidine (PEPCID) 20 MG tablet TAKE 1 TABLET TWICE A DAY WITH MEALS FOR INDIGESTION.  . fenofibrate (TRICOR) 145 MG tablet TAKE 1 TABLET DAILY FOR TRIGLYCERIDES (BLOOD FATS)  . ferrous sulfate 325 (65 FE) MG EC tablet Take 1 tab of ferrous sulfate (iron) twice daily by mouth with vitamin C.  . FLUoxetine (PROZAC) 40 MG capsule TAKE ONE CAPSULE BY MOUTH EVERY DAY (Patient taking differently: Take 40 mg by mouth daily. )  . glipiZIDE (GLUCOTROL) 5 MG tablet TAKE 1 TABLET BY MOUTH 3 TIMES A DAY WITH MEALS FOR DIABETES  . hydrALAZINE (APRESOLINE) 10 MG tablet Take 1 tablet (10 mg total) by mouth 3 (three) times daily.  . hydrochlorothiazide (MICROZIDE) 12.5 MG capsule Take 2 capsules (25 mg total) by mouth daily.  Marland Kitchen levothyroxine (SYNTHROID) 88 MCG tablet TAKE 1 TABLET DAILY ON AN EMPTY STOMACH WITH ONLY WATER FOR 30 MINUTES & NO ANTACID MEDS, CALCIUM OR MAGNESIUM FOR 4 HOURS & AVOID BIOTIN  . Multiple Vitamins-Minerals (MULTIVITAMIN WITH MINERALS) tablet Take 1 tablet by mouth daily.  Marland Kitchen NOVOLIN N 100 UNIT/ML injection INJECT 50 UNITS EVERY MORNING AND 25 UNITS EVERY EVENING (Patient taking differently: Inject 30-50 Units into the skin daily before breakfast. 130 or higher 30 units 200- 50 units)  . nystatin cream (MYCOSTATIN) Apply 1 application topically 2 (two) times daily.  . ondansetron (ZOFRAN ODT) 8 MG disintegrating tablet Dissolve 1/2 to 1 tablet under tongue every 6 hours as needed for Nausea or Vomitting  . ONETOUCH ULTRA test strip TEST BLOOD SUGAR 3 TIMES A DAY  . polyethylene glycol (MIRALAX /  GLYCOLAX) 17 g packet Take 17 g by mouth daily.  . potassium chloride (KLOR-CON M10) 10 MEQ tablet TAKE 1 TABLET 3 X /DAY WITH MEALS FOR POTASSIUM  . prochlorperazine (COMPAZINE) 10 MG tablet Take 1 tablet every 4 to 6 hours (Maximum 3 tablets /day) if needed for Nausea or Vomiting  . triamcinolone cream (KENALOG) 0.5 % Apply 1 application topically 2 (two) times daily.  . vitamin C (ASCORBIC ACID) 500 MG tablet Take 500 mg by mouth daily. Take with iron/ferrous sulfate  . ferrous sulfate 325 (65 FE) MG tablet TAKE 1 TABLET BY MOUTH TWICE A DAY WITH VITAMIN C   No current facility-administered medications on file prior to visit.    ROS: all negative except above.   Physical Exam:  BP (!) 158/74   Pulse 83   Temp 97.9 F (  36.6 C)   Wt 154 lb (69.9 kg)   SpO2 99%   BMI 27.28 kg/m   General Appearance: Well nourished, pale, in no apparent distress. Eyes: PERRLA, EOMs, conjunctiva no swelling or erythema Sinuses: No Frontal/maxillary tenderness ENT/Mouth: Ext aud canals clear, TMs without erythema, bulging. No erythema, swelling, or exudate on post pharynx.  Tonsils not swollen or erythematous. Hearing normal.  Neck: Supple, thyroid normal.  Respiratory: Respiratory effort normal, BS equal bilaterally without rales, rhonchi, wheezing or stridor.  Cardio: RRR with no MRGs. Brisk peripheral pulses.  Edema +1 LLE. Abdomen: Soft, + BS.  Non tender, no guarding, rebound, hernias, masses. Lymphatics: Non tender without lymphadenopathy.  Musculoskeletal: Full ROM, 5/5 strength, normal gait.  Skin: Warm, dry without rashes, lesions, ecchymosis.  Neuro: Cranial nerves intact. Normal muscle tone, no cerebellar symptoms. Sensation intact.  Psych: Awake and oriented X 3, normal affect, Insight and Judgment appropriate.      Garnet Sierras, Laqueta Jean, DNP [facility] 03/29/2020  12:44 PM

## 2020-03-27 ENCOUNTER — Encounter (INDEPENDENT_AMBULATORY_CARE_PROVIDER_SITE_OTHER): Payer: HMO | Admitting: Podiatry

## 2020-03-27 NOTE — Progress Notes (Signed)
This encounter was created in error - please disregard.

## 2020-03-29 ENCOUNTER — Ambulatory Visit (INDEPENDENT_AMBULATORY_CARE_PROVIDER_SITE_OTHER): Payer: HMO | Admitting: Adult Health Nurse Practitioner

## 2020-03-29 ENCOUNTER — Other Ambulatory Visit: Payer: Self-pay

## 2020-03-29 ENCOUNTER — Encounter: Payer: Self-pay | Admitting: Adult Health Nurse Practitioner

## 2020-03-29 VITALS — BP 158/74 | HR 83 | Temp 97.9°F | Wt 154.0 lb

## 2020-03-29 DIAGNOSIS — N185 Chronic kidney disease, stage 5: Secondary | ICD-10-CM | POA: Diagnosis not present

## 2020-03-29 DIAGNOSIS — Z79899 Other long term (current) drug therapy: Secondary | ICD-10-CM

## 2020-03-29 DIAGNOSIS — D6859 Other primary thrombophilia: Secondary | ICD-10-CM

## 2020-03-29 DIAGNOSIS — I82411 Acute embolism and thrombosis of right femoral vein: Secondary | ICD-10-CM

## 2020-03-29 NOTE — Patient Instructions (Addendum)
  Today we discussed change your Eliquis 2.5mg  to Coumadin.  We will do a referral to the Coumadin Clinic to change your medications.  Continue taking this medication until your appointment.   There are three different options to change your medication:   1.  We can do a referral for Coumadin clinic to switch your medication.   2.  Continue to Eliquis 2.5mg  twice a day.  You will have samples and you can call Eliquis [phone] to ask about their assistance program to get the medication at a decreased cost.

## 2020-04-05 ENCOUNTER — Other Ambulatory Visit: Payer: Self-pay | Admitting: Podiatry

## 2020-04-05 ENCOUNTER — Encounter: Payer: Self-pay | Admitting: Podiatry

## 2020-04-05 ENCOUNTER — Ambulatory Visit: Payer: HMO | Admitting: Podiatry

## 2020-04-05 ENCOUNTER — Other Ambulatory Visit: Payer: Self-pay

## 2020-04-05 ENCOUNTER — Ambulatory Visit (INDEPENDENT_AMBULATORY_CARE_PROVIDER_SITE_OTHER): Payer: HMO

## 2020-04-05 DIAGNOSIS — R609 Edema, unspecified: Secondary | ICD-10-CM

## 2020-04-05 DIAGNOSIS — M779 Enthesopathy, unspecified: Secondary | ICD-10-CM

## 2020-04-05 DIAGNOSIS — D689 Coagulation defect, unspecified: Secondary | ICD-10-CM

## 2020-04-05 DIAGNOSIS — M79675 Pain in left toe(s): Secondary | ICD-10-CM

## 2020-04-05 DIAGNOSIS — M79674 Pain in right toe(s): Secondary | ICD-10-CM | POA: Diagnosis not present

## 2020-04-05 DIAGNOSIS — B351 Tinea unguium: Secondary | ICD-10-CM | POA: Diagnosis not present

## 2020-04-06 NOTE — Progress Notes (Signed)
Subjective:   Patient ID: Ashley [name], female   DOB: 74 y.o.   MRN: [id number]   HPI Patient presents stating that she has a lot of pain in her right ankle that is been going on for a fairly long period of time that she smokes 2 packs of cigarettes a day and that she gets nails they get thickened bilateral and is it hard for her to trim or to wear shoe gear with comfortably.  Patient is currently on a blood thinner   Review of Systems  All other systems reviewed and are negative.       Objective:  Physical Exam Vitals and nursing note reviewed.  Constitutional:      Appearance: She is well-developed.  Pulmonary:     Effort: Pulmonary effort is normal.  Musculoskeletal:        General: Normal range of motion.  Skin:    General: Skin is warm.  Neurological:     Mental Status: She is alert.     Neurovascular status was found to be weak but intact with mild diminishment sharp dull vibratory.  Patient has low-grade edema around the right ankle with exquisite discomfort in the sinus tarsi but negative [name] sign noted.  Her nails are very thickened and elongated and impossible for her to cut and hard and she does present with caregiver today and is relatively frail     Assessment:  Inflammatory capsulitis of the sinus tarsi right with mycotic nail infection 1-5 both feet with pain     Plan:  H&P reviewed both conditions and at this point went ahead and did sterile prep and injected the sinus tarsi right 3 mg Kenalog 5 mg Xylocaine and debrided nailbeds 1-5 both feet she had slight bleeding on the right hallux which was dressed with Neosporin and she will wear a Band-Aid on it for several days and she did have significant tissue underneath the nailbeds that caused this to happen.  Patient will be seen back for routine treatment in future due to the chronic nature of her nail disease and is encouraged to call with questions concerns  X-rays indicated quite a bit of arthritis of the  subtalar ankle joint right but no indications of fracture

## 2020-04-19 ENCOUNTER — Other Ambulatory Visit: Payer: Self-pay

## 2020-04-19 ENCOUNTER — Ambulatory Visit (INDEPENDENT_AMBULATORY_CARE_PROVIDER_SITE_OTHER): Payer: HMO | Admitting: Internal Medicine

## 2020-04-19 VITALS — BP 142/78 | HR 68 | Temp 97.7°F | Resp 16 | Ht 63.5 in | Wt 154.2 lb

## 2020-04-19 DIAGNOSIS — E1122 Type 2 diabetes mellitus with diabetic chronic kidney disease: Secondary | ICD-10-CM | POA: Diagnosis not present

## 2020-04-19 DIAGNOSIS — I10 Essential (primary) hypertension: Secondary | ICD-10-CM

## 2020-04-19 DIAGNOSIS — Z79899 Other long term (current) drug therapy: Secondary | ICD-10-CM

## 2020-04-19 DIAGNOSIS — Z7901 Long term (current) use of anticoagulants: Secondary | ICD-10-CM | POA: Diagnosis not present

## 2020-04-19 DIAGNOSIS — Z794 Long term (current) use of insulin: Secondary | ICD-10-CM

## 2020-04-19 DIAGNOSIS — I82411 Acute embolism and thrombosis of right femoral vein: Secondary | ICD-10-CM

## 2020-04-19 DIAGNOSIS — N185 Chronic kidney disease, stage 5: Secondary | ICD-10-CM

## 2020-04-19 NOTE — Progress Notes (Signed)
History of Present Illness:                                                      Ashley [name]  very nice 74 y.o. MWF presents for  follow up with  history of T2_NIDDM W/CKD4-5 who was dx'd on Apr 6 with a non occlusive Right Femoral Vein DVT and was  started on Eliquis. She is completing her complimentary Eliquis supply and desires to transition to Coumadin.  Her husband [name]) is on Coumadin and follows at the [facility] and she's advised to attend the coumadin clinic with her husband.       Ashley [name] is also followed by Dr Jannifer Hick re:her deteriorating renal functions.  In Nov 2020, she was hospitalized following recent dx of RLL cancer. With worsening Renal functions , her Gabapentin was d/c'd and she was transitioned from Metformin / Glipizide to bid Novolin 70/30  Which is administered by her husband. In December 2020, she underwent high dose ablative stereotactic Radiotherapy to the RLL nodule.     Medications  Current Outpatient Medications (Endocrine & Metabolic):  .  glipiZIDE (GLUCOTROL) 5 MG tablet, TAKE 1 TABLET BY MOUTH 3 TIMES A DAY WITH MEALS FOR DIABETES .  levothyroxine (SYNTHROID) 88 MCG tablet, TAKE 1 TABLET DAILY ON AN EMPTY STOMACH WITH ONLY WATER FOR 30 MINUTES & NO ANTACID MEDS, CALCIUM OR MAGNESIUM FOR 4 HOURS & AVOID BIOTIN .  NOVOLIN N 100 UNIT/ML injection, INJECT 50 UNITS EVERY MORNING AND 25 UNITS EVERY EVENING (Ashley [name] taking differently: Inject 30-50 Units into the skin daily before breakfast. 130 or higher 30 units 200- 50 units)  Current Outpatient Medications (Cardiovascular):  .  amLODipine (NORVASC) 10 MG tablet, Take 1 tablet (10 mg total) by mouth daily. Marland Kitchen  atorvastatin (LIPITOR) 40 MG tablet, Take 1 tablet Daily for Cholesterol .  chlorthalidone (HYGROTON) 25 MG tablet, Take 25 mg by mouth daily. .  fenofibrate (TRICOR) 145 MG tablet, TAKE 1 TABLET DAILY FOR TRIGLYCERIDES (BLOOD FATS) .  hydrALAZINE (APRESOLINE) 10 MG tablet, Take 1  tablet (10 mg total) by mouth 3 (three) times daily. .  hydrochlorothiazide (MICROZIDE) 12.5 MG capsule, Take 2 capsules (25 mg total) by mouth daily.  Current Outpatient Medications (Respiratory):  .  azelastine (ASTELIN) 0.1 % nasal spray, Place 2 sprays into both nostrils 2 (two) times daily. Use in each nostril as directed  Current Outpatient Medications (Analgesics):  .  aspirin EC 81 MG tablet, Take 81 mg by mouth daily.  Current Outpatient Medications (Hematological):  .  apixaban (ELIQUIS) 2.5 MG TABS tablet, Take 1 tablet 2 x /day to Dissolve Blood Clots .  ferrous sulfate 325 (65 FE) MG tablet, TAKE 1 TABLET BY MOUTH TWICE A DAY WITH VITAMIN C  Current Outpatient Medications (Other):  Marland Kitchen  Cholecalciferol (VITAMIN D3) 25 MCG (1000 UT) CAPS, Take by mouth. .  clonazePAM (KLONOPIN) 1 MG tablet, Take 1 mg by mouth at bedtime.  .  docusate sodium (COLACE) 100 MG capsule, TAKE 1 CAPSULE BY MOUTH TWICE A DAY .  doxycycline (VIBRAMYCIN) 100 MG capsule, Take 1 capsule twice daily with food .  famotidine (PEPCID) 20 MG tablet, TAKE 1 TABLET TWICE A DAY WITH MEALS FOR INDIGESTION. Marland Kitchen  FLUoxetine (PROZAC) 40 MG capsule, TAKE ONE CAPSULE BY MOUTH EVERY DAY (Ashley [name] taking  differently: Take 40 mg by mouth daily. ) .  GAVILAX 17 GM/SCOOP powder, Take 17 g by mouth daily. .  Multiple Vitamins-Minerals (MULTIVITAMIN WITH MINERALS) tablet, Take 1 tablet by mouth daily. Marland Kitchen  nystatin cream (MYCOSTATIN), Apply 1 application topically 2 (two) times daily. .  ondansetron (ZOFRAN ODT) 8 MG disintegrating tablet, Dissolve 1/2 to 1 tablet under tongue every 6 hours as needed for Nausea or Vomitting .  ONETOUCH ULTRA test strip, TEST BLOOD SUGAR 3 TIMES A DAY .  polyethylene glycol (MIRALAX / GLYCOLAX) 17 g packet, Take 17 g by mouth daily. .  potassium chloride (KLOR-CON M10) 10 MEQ tablet, TAKE 1 TABLET 3 X /DAY WITH MEALS FOR POTASSIUM .  prochlorperazine (COMPAZINE) 10 MG tablet, Take 1 tablet every 4 to  6 hours (Maximum 3 tablets /day) if needed for Nausea or Vomiting .  triamcinolone cream (KENALOG) 0.5 %, Apply 1 application topically 2 (two) times daily. .  vitamin C (ASCORBIC ACID) 500 MG tablet, Take 500 mg by mouth daily. Take with iron/ferrous sulfate  Problem list She has Major depressive disorder, recurrent episode ([place]); Essential hypertension; COPD; Carpal tunnel syndrome; Diabetic neuropathy ([place]); Abnormality of gait; Vitamin D deficiency; Medication management; Bipolar depression ([place]); Non compliance w medication regimen; Gout; Hypothyroidism; GERD (gastroesophageal reflux disease); CKD stage 4 due to type 2 diabetes mellitus ([place]); Frequent falls; Memory difficulty; Hyperlipidemia associated with type 2 diabetes mellitus ([place]); Mild nonproliferative diabetic retinopathy of both eyes without macular edema associated with type 2 diabetes mellitus ([place]); Senile purpura ([place]); Polyp of ascending colon; Peripheral arterial disease ([place]); Diabetes mellitus type 2, uncontrolled ([place]); Obesity (BMI 30.0-34.9); Long term (current) use of insulin ([place]); Right lower lobe pulmonary nodule; CKD (chronic kidney disease), stage IV ([place]); Thrombophilia ([place]); Cancer ([place]); History of lung cancer; and Acute deep vein thrombosis (DVT) of femoral vein of right lower extremity (HCC) on their problem list.   Observations/Objective:   BP (!) 142/78   Pulse 68   Temp 97.7 F (36.5 C)   Resp 16   Ht 5' 3.5" (1.613 m)   Wt 154 lb 3.2 oz (69.9 kg)   SpO2 99%   BMI 26.89 kg/m   HEENT - WNL. Neck - supple.  Chest - Clear equal BS. Cor - Nl HS. RRR w/o sig MGR. PP 1(+). No edema. MS- FROM w/o deformities.  Gait Nl. Neuro -  Nl w/o focal abnormalities.  Assessment and Plan:  1. Essential hypertension  - CBC with Differential/Platelet - COMPLETE METABOLIC PANEL WITH GFR  2. Type 2 diabetes mellitus with stage 5 chronic kidney disease not on chronic dialysis, with long-term current use of insulin  (HCC)  - CBC with Differential/Platelet - COMPLETE METABOLIC PANEL WITH GFR  3. Deep vein thrombosis (DVT) of femoral vein of right lower extremity, unspecified chronicity (HCC)  - Protime-INR  4. Medication management  - CBC with Differential/Platelet - COMPLETE METABOLIC PANEL WITH GFR - Protime-INR  5. Chronic anticoagulation  - Protime-INR  Follow Up Instructions:       I discussed the assessment and treatment plan with the Ashley [name]. The Ashley [name] was provided an opportunity to ask questions and all were answered. The Ashley [name] agreed with the plan and demonstrated an understanding of the instructions.        The Ashley [name] was advised to call back or seek an in-person evaluation if the symptoms worsen or if the condition fails to improve as anticipated.   Kirtland Bouchard, MD

## 2020-04-19 NOTE — Patient Instructions (Addendum)
===============================    Stop Glipizide  Stop Hydrochlorothiazide  Stop Doxycycline  =============================== ===============================  Please call Dr Lysbeth Penner office to  get started on Coumadin   When you run out of  Eliquis in 1 week   ===============================

## 2020-04-20 ENCOUNTER — Encounter: Payer: Self-pay | Admitting: Internal Medicine

## 2020-04-20 ENCOUNTER — Ambulatory Visit: Payer: HMO | Admitting: Internal Medicine

## 2020-04-20 VITALS — BP 128/82 | HR 79 | Ht 63.5 in | Wt 155.6 lb

## 2020-04-20 DIAGNOSIS — I82411 Acute embolism and thrombosis of right femoral vein: Secondary | ICD-10-CM | POA: Diagnosis not present

## 2020-04-20 DIAGNOSIS — R600 Localized edema: Secondary | ICD-10-CM

## 2020-04-20 DIAGNOSIS — I10 Essential (primary) hypertension: Secondary | ICD-10-CM | POA: Diagnosis not present

## 2020-04-20 DIAGNOSIS — E782 Mixed hyperlipidemia: Secondary | ICD-10-CM | POA: Diagnosis not present

## 2020-04-20 LAB — COMPLETE METABOLIC PANEL WITH GFR
AG Ratio: 1.4 (calc) (ref 1.0–2.5)
ALT: 12 U/L (ref 6–29)
AST: 14 U/L (ref 10–35)
Albumin: 3.4 g/dL — ABNORMAL LOW (ref 3.6–5.1)
Alkaline phosphatase (APISO): 33 U/L — ABNORMAL LOW (ref 37–153)
BUN/Creatinine Ratio: 17 (calc) (ref 6–22)
BUN: 56 mg/dL — ABNORMAL HIGH (ref 7–25)
CO2: 24 mmol/L (ref 20–32)
Calcium: 9.1 mg/dL (ref 8.6–10.4)
Chloride: 100 mmol/L (ref 98–110)
Creat: 3.33 mg/dL — ABNORMAL HIGH (ref 0.60–0.93)
GFR, Est African American: 15 mL/min/{1.73_m2} — ABNORMAL LOW (ref >=60)
GFR, Est Non African American: 13 mL/min/{1.73_m2} — ABNORMAL LOW (ref >=60)
Globulin: 2.4 g/dL (calc) (ref 1.9–3.7)
Glucose, Bld: 215 mg/dL — ABNORMAL HIGH (ref 65–99)
Potassium: 3.5 mmol/L (ref 3.5–5.3)
Sodium: 136 mmol/L (ref 135–146)
Total Bilirubin: 0.3 mg/dL (ref 0.2–1.2)
Total Protein: 5.8 g/dL — ABNORMAL LOW (ref 6.1–8.1)

## 2020-04-20 LAB — CBC WITH DIFFERENTIAL/PLATELET
Absolute Monocytes: 531 cells/uL (ref 200–950)
Basophils Absolute: 28 cells/uL (ref 0–200)
Basophils Relative: 0.4 %
Eosinophils Absolute: 138 cells/uL (ref 15–500)
Eosinophils Relative: 2 %
HCT: 32 % — ABNORMAL LOW (ref 35.0–45.0)
Hemoglobin: 10.5 g/dL — ABNORMAL LOW (ref 11.7–15.5)
Lymphs Abs: 1111 cells/uL (ref 850–3900)
MCH: 30 pg (ref 27.0–33.0)
MCHC: 32.8 g/dL (ref 32.0–36.0)
MCV: 91.4 fL (ref 80.0–100.0)
MPV: 9 fL (ref 7.5–12.5)
Monocytes Relative: 7.7 %
Neutro Abs: 5092 cells/uL (ref 1500–7800)
Neutrophils Relative %: 73.8 %
Platelets: 365 10*3/uL (ref 140–400)
RBC: 3.5 10*6/uL — ABNORMAL LOW (ref 3.80–5.10)
RDW: 14 % (ref 11.0–15.0)
Total Lymphocyte: 16.1 %
WBC: 6.9 10*3/uL (ref 3.8–10.8)

## 2020-04-20 LAB — PROTIME-INR
INR: 0.9
Prothrombin Time: 10.1 s (ref 9.0–11.5)

## 2020-04-20 MED ORDER — WARFARIN SODIUM 5 MG PO TABS: 5.0000 mg | ORAL_TABLET | Freq: Every day | ORAL | 3 refills | Status: DC

## 2020-04-20 NOTE — Patient Instructions (Addendum)
Medication Instructions:   Take Eliquis 5 mg twice daily thru Monday evening.  Start warfarin 5 mg once daily in the evenings on Saturday Sept 4.  You will take both Eliquis and warfarin for 3 days.  After Monday, continue with the warfarin 5 mg each evening.  Come to the office on Thursday   *If you need a refill on your cardiac medications before your next appointment, please call your pharmacy*   Lab Work: None Ordered At This Time.   If you have labs (blood work) drawn today and your tests are completely normal, you will receive your results only by: Marland Kitchen MyChart Message (if you have MyChart) OR . A paper copy in the mail If you have any lab test that is abnormal or we need to change your treatment, we will call you to review the results.  Testing/Procedures: Your physician has requested that you have a lower extremity venous duplex. This test is an ultrasound of the veins in the legs. It looks at venous blood flow that carries blood from the heart to the legs. Allow one hour for a Lower Venous exam. There are no restrictions or special instructions.  Follow-Up: At [facility], you and your health needs are our priority.  As part of our continuing mission to provide you with exceptional heart care, we have created designated Provider Care Teams.  These Care Teams include your primary Cardiologist (physician) and Advanced Practice Providers (APPs -  Physician Assistants and Nurse Practitioners) who all work together to provide you with the care you need, when you need it.  We recommend signing up for the patient portal called "MyChart".  Sign up information is provided on this After Visit Summary.  MyChart is used to connect with patients for Virtual Visits (Telemedicine).  Patients are able to view lab/test results, encounter notes, upcoming appointments, etc.  Non-urgent messages can be sent to your provider as well.   To learn more about what you can do with MyChart, go to  [email or web address].    Your next appointment:   4 month(s) following lower venous dopplers   The format for your next appointment:   In Person  Provider:   K. Mali Hilty, MD

## 2020-04-20 NOTE — Progress Notes (Signed)
=============================================================  -   Follow Dr Lysbeth Penner instructions on Eliquis & Coumadin as per your  visit with him today  =============================================================  - CBC shows stable chronic anemia  -  Hgb is 10.5 gm% =============================================================  - Kidney functions are worse - GFR is down to 13   (Stage 5)   - Please be sure & confirm that you have an upcoming follow -up appt with   Dr [name] in the next 1 to 2 weeks as you indicated yesterday =============================================================  - Glucose 215 mg % - Continue Insulin  2 x  /day   - and work on diet & weight loss =============================================================

## 2020-04-21 ENCOUNTER — Encounter: Payer: Self-pay | Admitting: Internal Medicine

## 2020-04-22 ENCOUNTER — Encounter: Payer: Self-pay | Admitting: Internal Medicine
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
## 2020-04-25 ENCOUNTER — Other Ambulatory Visit: Payer: Self-pay | Admitting: Internal Medicine

## 2020-04-25 DIAGNOSIS — D649 Anemia, unspecified: Secondary | ICD-10-CM

## 2020-04-25 DIAGNOSIS — I10 Essential (primary) hypertension: Secondary | ICD-10-CM

## 2020-04-26 ENCOUNTER — Other Ambulatory Visit: Payer: Self-pay

## 2020-04-26 ENCOUNTER — Ambulatory Visit (INDEPENDENT_AMBULATORY_CARE_PROVIDER_SITE_OTHER): Payer: HMO

## 2020-04-26 DIAGNOSIS — Z7901 Long term (current) use of anticoagulants: Secondary | ICD-10-CM | POA: Insufficient documentation

## 2020-04-26 DIAGNOSIS — Z5181 Encounter for therapeutic drug level monitoring: Secondary | ICD-10-CM | POA: Diagnosis not present

## 2020-04-26 DIAGNOSIS — I82411 Acute embolism and thrombosis of right femoral vein: Secondary | ICD-10-CM

## 2020-04-26 LAB — POCT INR: INR: 1.6 — AB (ref 2.0–3.0)

## 2020-04-26 NOTE — Progress Notes (Signed)
Patient is aware of lab results and instructions. -e welch

## 2020-04-26 NOTE — Patient Instructions (Signed)
Take 1 tablet daily.  INR in 1 week. A full discussion of the nature of anticoagulants has been carried out.  A benefit risk analysis has been presented to the patient, so that they understand the justification for choosing anticoagulation at this time. The need for frequent and regular monitoring, precise dosage adjustment and compliance is stressed.  Side effects of potential bleeding are discussed.  Call if any signs of abnormal bleeding.  [phone]

## 2020-04-28 ENCOUNTER — Other Ambulatory Visit: Payer: Self-pay | Admitting: Adult Health

## 2020-05-01 ENCOUNTER — Ambulatory Visit (INDEPENDENT_AMBULATORY_CARE_PROVIDER_SITE_OTHER): Payer: HMO | Admitting: Pharmacist

## 2020-05-01 ENCOUNTER — Other Ambulatory Visit: Payer: Self-pay

## 2020-05-01 DIAGNOSIS — I82411 Acute embolism and thrombosis of right femoral vein: Secondary | ICD-10-CM | POA: Diagnosis not present

## 2020-05-01 DIAGNOSIS — Z7901 Long term (current) use of anticoagulants: Secondary | ICD-10-CM | POA: Diagnosis not present

## 2020-05-01 LAB — POCT INR: INR: 2.2 (ref 2.0–3.0)

## 2020-05-09 ENCOUNTER — Other Ambulatory Visit: Payer: Self-pay | Admitting: Internal Medicine

## 2020-05-09 DIAGNOSIS — E119 Type 2 diabetes mellitus without complications: Secondary | ICD-10-CM

## 2020-05-09 DIAGNOSIS — Z794 Long term (current) use of insulin: Secondary | ICD-10-CM

## 2020-05-15 ENCOUNTER — Other Ambulatory Visit: Payer: Self-pay

## 2020-05-15 ENCOUNTER — Ambulatory Visit (INDEPENDENT_AMBULATORY_CARE_PROVIDER_SITE_OTHER): Payer: HMO | Admitting: Pharmacist Clinician (PhC)/ Clinical Pharmacy Specialist

## 2020-05-15 DIAGNOSIS — I82411 Acute embolism and thrombosis of right femoral vein: Secondary | ICD-10-CM | POA: Diagnosis not present

## 2020-05-15 DIAGNOSIS — Z7901 Long term (current) use of anticoagulants: Secondary | ICD-10-CM | POA: Diagnosis not present

## 2020-05-15 LAB — POCT INR: INR: 1.5 — AB (ref 2.0–3.0)

## 2020-05-21 ENCOUNTER — Other Ambulatory Visit: Payer: Self-pay | Admitting: Internal Medicine

## 2020-05-21 ENCOUNTER — Telehealth: Payer: Self-pay | Admitting: *Deleted

## 2020-05-21 MED ORDER — ONDANSETRON 8 MG PO TBDP
ORAL_TABLET | ORAL | 0 refills | Status: DC
Start: 2020-05-21 — End: 2020-05-22

## 2020-05-21 MED ORDER — CLORAZEPATE DIPOTASSIUM 7.5 MG PO TABS: ORAL_TABLET | ORAL | 0 refills | Status: DC

## 2020-05-21 MED ORDER — PROCHLORPERAZINE MALEATE 10 MG PO TABS
ORAL_TABLET | ORAL | 0 refills | Status: DC
Start: 2020-05-21 — End: 2020-05-22

## 2020-05-21 NOTE — Telephone Encounter (Signed)
Returned call to patient regarding cataract surgery on 05/29/2020, by Dr Katy Apo. Patient is very anxious and asked for a medication for nerves and nausea. Dr Melford Aase will send in RX for Tranxene and Compazine. Reassured patient and tried to assure her she will do OK doing the surgery.

## 2020-05-22 ENCOUNTER — Other Ambulatory Visit: Payer: Self-pay | Admitting: Internal Medicine

## 2020-05-22 ENCOUNTER — Other Ambulatory Visit: Payer: Self-pay | Admitting: *Deleted

## 2020-05-22 ENCOUNTER — Telehealth: Payer: Self-pay | Admitting: *Deleted

## 2020-05-22 MED ORDER — CLORAZEPATE DIPOTASSIUM 7.5 MG PO TABS: ORAL_TABLET | ORAL | 0 refills | Status: AC

## 2020-05-22 MED ORDER — PROCHLORPERAZINE MALEATE 10 MG PO TABS: ORAL_TABLET | ORAL | 0 refills | Status: DC

## 2020-05-22 NOTE — Telephone Encounter (Signed)
Records faxed for upcoming cataract surgery.

## 2020-05-28 ENCOUNTER — Other Ambulatory Visit: Payer: Self-pay

## 2020-05-28 ENCOUNTER — Ambulatory Visit (INDEPENDENT_AMBULATORY_CARE_PROVIDER_SITE_OTHER): Payer: HMO

## 2020-05-28 DIAGNOSIS — Z7901 Long term (current) use of anticoagulants: Secondary | ICD-10-CM

## 2020-05-28 DIAGNOSIS — I82411 Acute embolism and thrombosis of right femoral vein: Secondary | ICD-10-CM

## 2020-05-28 LAB — POCT INR: INR: 1.2 — AB (ref 2.0–3.0)

## 2020-05-28 NOTE — Patient Instructions (Signed)
Take 2 tablets today Tuesday October 11, then increase dose to 1 tablet daily except 1.5 tablets each Sunday, Tuesday and Thursday.   Next INR in 2 weeks.

## 2020-05-29 ENCOUNTER — Other Ambulatory Visit: Payer: Self-pay | Admitting: Physician Assistant

## 2020-06-03 ENCOUNTER — Other Ambulatory Visit: Payer: Self-pay | Admitting: Internal Medicine

## 2020-06-05 ENCOUNTER — Ambulatory Visit: Payer: Self-pay | Admitting: *Deleted

## 2020-06-12 ENCOUNTER — Other Ambulatory Visit: Payer: Self-pay

## 2020-06-12 ENCOUNTER — Ambulatory Visit (INDEPENDENT_AMBULATORY_CARE_PROVIDER_SITE_OTHER): Payer: HMO | Admitting: Pharmacist

## 2020-06-12 DIAGNOSIS — Z7901 Long term (current) use of anticoagulants: Secondary | ICD-10-CM | POA: Diagnosis not present

## 2020-06-12 DIAGNOSIS — I82411 Acute embolism and thrombosis of right femoral vein: Secondary | ICD-10-CM | POA: Diagnosis not present

## 2020-06-12 LAB — POCT INR: INR: 1.2 — AB (ref 2.0–3.0)

## 2020-06-19 ENCOUNTER — Other Ambulatory Visit: Payer: Self-pay

## 2020-06-19 ENCOUNTER — Ambulatory Visit (INDEPENDENT_AMBULATORY_CARE_PROVIDER_SITE_OTHER): Payer: HMO | Admitting: Internal Medicine

## 2020-06-19 ENCOUNTER — Encounter: Payer: Self-pay | Admitting: Internal Medicine

## 2020-06-19 VITALS — BP 124/80 | HR 76 | Temp 97.3°F | Resp 16 | Ht 63.5 in | Wt 159.9 lb

## 2020-06-19 DIAGNOSIS — Z1211 Encounter for screening for malignant neoplasm of colon: Secondary | ICD-10-CM

## 2020-06-19 DIAGNOSIS — Z79899 Other long term (current) drug therapy: Secondary | ICD-10-CM

## 2020-06-19 DIAGNOSIS — Z0000 Encounter for general adult medical examination without abnormal findings: Secondary | ICD-10-CM

## 2020-06-19 DIAGNOSIS — I739 Peripheral vascular disease, unspecified: Secondary | ICD-10-CM

## 2020-06-19 DIAGNOSIS — E113293 Type 2 diabetes mellitus with mild nonproliferative diabetic retinopathy without macular edema, bilateral: Secondary | ICD-10-CM

## 2020-06-19 DIAGNOSIS — J438 Other emphysema: Secondary | ICD-10-CM

## 2020-06-19 DIAGNOSIS — Z8249 Family history of ischemic heart disease and other diseases of the circulatory system: Secondary | ICD-10-CM

## 2020-06-19 DIAGNOSIS — Z1212 Encounter for screening for malignant neoplasm of rectum: Secondary | ICD-10-CM

## 2020-06-19 DIAGNOSIS — E039 Hypothyroidism, unspecified: Secondary | ICD-10-CM

## 2020-06-19 DIAGNOSIS — M1000 Idiopathic gout, unspecified site: Secondary | ICD-10-CM

## 2020-06-19 DIAGNOSIS — D6869 Other thrombophilia: Secondary | ICD-10-CM

## 2020-06-19 DIAGNOSIS — E669 Obesity, unspecified: Secondary | ICD-10-CM

## 2020-06-19 DIAGNOSIS — I700 Atherosclerosis of aorta: Secondary | ICD-10-CM

## 2020-06-19 DIAGNOSIS — Z9114 Patient's other noncompliance with medication regimen: Secondary | ICD-10-CM

## 2020-06-19 DIAGNOSIS — E559 Vitamin D deficiency, unspecified: Secondary | ICD-10-CM

## 2020-06-19 DIAGNOSIS — F319 Bipolar disorder, unspecified: Secondary | ICD-10-CM

## 2020-06-19 DIAGNOSIS — I10 Essential (primary) hypertension: Secondary | ICD-10-CM

## 2020-06-19 DIAGNOSIS — Z136 Encounter for screening for cardiovascular disorders: Secondary | ICD-10-CM

## 2020-06-19 DIAGNOSIS — F17210 Nicotine dependence, cigarettes, uncomplicated: Secondary | ICD-10-CM

## 2020-06-19 DIAGNOSIS — C3431 Malignant neoplasm of lower lobe, right bronchus or lung: Secondary | ICD-10-CM

## 2020-06-19 DIAGNOSIS — N185 Chronic kidney disease, stage 5: Secondary | ICD-10-CM

## 2020-06-19 DIAGNOSIS — E1169 Type 2 diabetes mellitus with other specified complication: Secondary | ICD-10-CM

## 2020-06-19 DIAGNOSIS — E1142 Type 2 diabetes mellitus with diabetic polyneuropathy: Secondary | ICD-10-CM

## 2020-06-19 DIAGNOSIS — E1122 Type 2 diabetes mellitus with diabetic chronic kidney disease: Secondary | ICD-10-CM

## 2020-06-19 DIAGNOSIS — Z0001 Encounter for general adult medical examination with abnormal findings: Secondary | ICD-10-CM

## 2020-06-19 DIAGNOSIS — Z794 Long term (current) use of insulin: Secondary | ICD-10-CM

## 2020-06-19 DIAGNOSIS — I82411 Acute embolism and thrombosis of right femoral vein: Secondary | ICD-10-CM

## 2020-06-19 MED ORDER — TORSEMIDE 20 MG PO TABS: ORAL_TABLET | ORAL | 0 refills | Status: DC
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
## 2020-06-19 NOTE — Patient Instructions (Signed)

## 2020-06-20 ENCOUNTER — Other Ambulatory Visit: Payer: Self-pay | Admitting: *Deleted

## 2020-06-20 LAB — HEMOGLOBIN A1C
Hgb A1c MFr Bld: 8.4 % of total Hgb — ABNORMAL HIGH (ref <5.7)
Mean Plasma Glucose: 194 (calc)
eAG (mmol/L): 10.8 (calc)

## 2020-06-20 LAB — LIPID PANEL
Cholesterol: 158 mg/dL (ref <200)
HDL: 37 mg/dL — ABNORMAL LOW (ref >=50)
LDL Cholesterol (Calc): 79 mg/dL (calc)
Non-HDL Cholesterol (Calc): 121 mg/dL (calc) (ref <130)
Total CHOL/HDL Ratio: 4.3 (calc) (ref <5.0)
Triglycerides: 347 mg/dL — ABNORMAL HIGH (ref <150)

## 2020-06-20 LAB — URINALYSIS, ROUTINE W REFLEX MICROSCOPIC
Bacteria, UA: NONE SEEN /HPF
Bilirubin Urine: NEGATIVE
Hgb urine dipstick: NEGATIVE
Hyaline Cast: NONE SEEN /LPF
Ketones, ur: NEGATIVE
Leukocytes,Ua: NEGATIVE
Nitrite: NEGATIVE
Specific Gravity, Urine: 1.009 (ref 1.001–1.03)
Squamous Epithelial / HPF: NONE SEEN /HPF (ref <=5)
pH: 6.5 (ref 5.0–8.0)

## 2020-06-20 LAB — CBC WITH DIFFERENTIAL/PLATELET
Absolute Monocytes: 552 cells/uL (ref 200–950)
Basophils Absolute: 40 cells/uL (ref 0–200)
Basophils Relative: 0.5 %
Eosinophils Absolute: 240 cells/uL (ref 15–500)
Eosinophils Relative: 3 %
HCT: 35.5 % (ref 35.0–45.0)
Hemoglobin: 11.6 g/dL — ABNORMAL LOW (ref 11.7–15.5)
Lymphs Abs: 912 cells/uL (ref 850–3900)
MCH: 29.4 pg (ref 27.0–33.0)
MCHC: 32.7 g/dL (ref 32.0–36.0)
MCV: 90.1 fL (ref 80.0–100.0)
MPV: 9.1 fL (ref 7.5–12.5)
Monocytes Relative: 6.9 %
Neutro Abs: 6256 cells/uL (ref 1500–7800)
Neutrophils Relative %: 78.2 %
Platelets: 364 10*3/uL (ref 140–400)
RBC: 3.94 10*6/uL (ref 3.80–5.10)
RDW: 13.2 % (ref 11.0–15.0)
Total Lymphocyte: 11.4 %
WBC: 8 10*3/uL (ref 3.8–10.8)

## 2020-06-20 LAB — COMPLETE METABOLIC PANEL WITH GFR
AG Ratio: 1.5 (calc) (ref 1.0–2.5)
ALT: 15 U/L (ref 6–29)
AST: 14 U/L (ref 10–35)
Albumin: 3.5 g/dL — ABNORMAL LOW (ref 3.6–5.1)
Alkaline phosphatase (APISO): 54 U/L (ref 37–153)
BUN/Creatinine Ratio: 15 (calc) (ref 6–22)
BUN: 46 mg/dL — ABNORMAL HIGH (ref 7–25)
CO2: 28 mmol/L (ref 20–32)
Calcium: 9.7 mg/dL (ref 8.6–10.4)
Chloride: 98 mmol/L (ref 98–110)
Creat: 3.12 mg/dL — ABNORMAL HIGH (ref 0.60–0.93)
GFR, Est African American: 16 mL/min/{1.73_m2} — ABNORMAL LOW (ref >=60)
GFR, Est Non African American: 14 mL/min/{1.73_m2} — ABNORMAL LOW (ref >=60)
Globulin: 2.3 g/dL (calc) (ref 1.9–3.7)
Glucose, Bld: 320 mg/dL — ABNORMAL HIGH (ref 65–99)
Potassium: 3.8 mmol/L (ref 3.5–5.3)
Sodium: 136 mmol/L (ref 135–146)
Total Bilirubin: 0.3 mg/dL (ref 0.2–1.2)
Total Protein: 5.8 g/dL — ABNORMAL LOW (ref 6.1–8.1)

## 2020-06-20 LAB — MICROALBUMIN / CREATININE URINE RATIO
Creatinine, Urine: 41 mg/dL (ref 20–275)
Microalb Creat Ratio: 5583 mcg/mg creat — ABNORMAL HIGH (ref <30)
Microalb, Ur: 228.9 mg/dL

## 2020-06-20 LAB — TSH: TSH: 0.73 mIU/L (ref 0.40–4.50)

## 2020-06-20 LAB — MAGNESIUM: Magnesium: 2.1 mg/dL (ref 1.5–2.5)

## 2020-06-20 LAB — PTH, INTACT AND CALCIUM
Calcium: 9.7 mg/dL (ref 8.6–10.4)
PTH: 37 pg/mL (ref 14–64)

## 2020-06-20 LAB — VITAMIN D 25 HYDROXY (VIT D DEFICIENCY, FRACTURES): Vit D, 25-Hydroxy: 59 ng/mL (ref 30–100)

## 2020-06-20 LAB — URIC ACID: Uric Acid, Serum: 6.9 mg/dL (ref 2.5–7.0)

## 2020-06-20 LAB — INSULIN, RANDOM: Insulin: 25.2 u[IU]/mL — ABNORMAL HIGH

## 2020-06-20 NOTE — Patient Outreach (Signed)
[facility]) Care Management Chronic Special Needs Program    06/20/2020  Name: Ashley [name], DOB: 25-Apr-1946  MRN: [id number]   Ashley [name] is enrolled in a chronic special needs plan for Diabetes.  Outreach call to client for initial telephone outreach (client previously refused to talk and requested care plan only to be mailed).  Spoke with client who reports she still does not feel like talking and requests care plan be mailed, client states she is "getting ready to have cataracts removed and there is still a lot going on"  Client does not wish to talk further.    Goals    . Client understands the importance of follow-up with providers by attending scheduled visits     Continue to follow as recommended by your health care provider.     . Client will not report change from baseline and no repeated symptoms of stroke with in the next 6  months     Know the signs and symptoms to prevent another stroke: Facial drooping (unable to smile), arm weakness (one arm weak or numb), speech is difficult (slurred, unable to speak).  Call 911, this is an emergency!  Get to a hospital immediately.  Reviewed stroke symptoms and client states understanding of when to call provider. Plan to check blood pressure regularly.  If you don't have a B/P monitor (cuff), one will be provided to you. Plan to take anticoagulant (blood thinner) as ordered to prevent stroke. Plan to follow a low salt meal plan, read food labels for the sodium content. Increase activity as tolerated and recommended by provider.      . Client will report improved coping by continuing to take medications for depression.     Continue to follow up with your doctor and talk with doctor about any worsening symptoms or overall management of depression Please take all medications as prescribed      . Client will report no fall or injuries in the next 6 months.     Please use assistive devices such as cane or walker as  needed and per doctor recommendation Keep pathways clear, remove scatter rugs and any loose cords, etc. Always ask for assistance as needed     . Client will use Assistive Devices as needed and verbalize understanding of device use     Please use cane and walker as ordered      . Client will verbalize knowledge of lung disease as evidenced by no ED visits or Inpatient stays related to lung disease     Follow up with Radiation Oncologist at the [facility] for scheduled appointments.      . Client will verbalize knowledge of self management of Hypertension as evidences by BP reading of 140/90 or less; or as defined by provider     Plan to check blood pressure regularly.  If you do not have a B/P monitor (cuff), one can be provided to you.  Write results in your [employer] calendar (in the back section). Reviewed blood pressure medication from EMR. Take B/P medications as ordered.  Some may cause you to use the bathroom more. Plan to eat low salt and heart healthy meals full of fruits, vegetables, whole grains, lean protein and limit fat and sugars. Increase activity as tolerated. Reviewed lifestyle modification- smoking cessation, weight control and reducing stress. EMMI education article provided "High Blood pressure in adults"   Review and plan to discuss with RN during next telephonic assessment.     Marland Kitchen  HEMOGLOBIN A1C < 7     Your last documented AIC is 7.6 on 03/06/20.  Have your [facility] checked every 6 months if you are at goal or every 3 months if you are not at goal. Check blood sugars daily before eating with goal of 80-130.  You can also check 1 1/2 hours after eating with goal of 180 or less. Plan to eat low carbohydrate and low salt meals, watch portion sizes and avoid sugar sweetened drinks.  Discussed carbohydrate control meals. Reviewed signs and symptoms of hyperglycemia (high blood sugar) and hypoglycemia (low blood sugar) and actions to take. Review [employer] calendar (sent in the mail) for diabetes action plan in the back. Increase activity only if you are able to do it.  Follow doctor recommendations. EMMI education provided on "Diabetic Meal Planning".  Review and plan to discuss with RN during next telephonic assessment.  Use 24 hour nurse advice line as needed at [phone]      . Maintain timely refills of diabetic medication as prescribed within the year .     Contact your RN care manager if you have questions about medicines. Medication review completed from EMR information. It is important to take your medications as prescribed.       . Obtain annual  Lipid Profile, LDL-C     Per medical record review, Lipid profile completed on 03/06/20 LDL= 79 The goal for LDL is less than 70mg /dl as you are at high risk for complications. Try to avoid saturated fats, trans-fats and eat more fiber. Plan to take statin (cholesterol) medicine as ordered.       . Obtain Annual Eye (retinal)  Exam      Your last documented eye exam was on 03/06/20 Diabetes can affect your vision.  Plan to have a dilated eye exam every year. Advised client to keep and/ or schedule appointment with eye doctor. Continue to use your eye drops as prescribed.      . Obtain Annual Foot Exam     Your doctor should check your bare feet at each visit. Diabetes can affect the nerves in your feet, causing decreased feeling or numbness. Check your feet and in-between toes daily for cuts, bruises, redness, blisters or sores.  If you cannot reach them, use a mirror. Wash feet with soap and water, dry feet well especially between toes.  Don't use too much lotion. Wear shoes that are not too tight and don't walk barefoot.      . Obtain annual screen for micro albuminuria (urine) , nephropathy (kidney problems)     Diabetes can affect your kidneys. It is important for your doctor to check your urine at least once a year  These tests show how your kidneys  are working.      . Obtain Hemoglobin A1C at least 2 times per year     Client reports AIC checked twice yearly    . Visit Primary Care Provider or Endocrinologist at least 2 times per year      Client is seeing primary care provider regularly         PLAN- RN care manager faxed today's note to primary care provider along with updated individualized care plan,  Mailed updated individualized care plan to client along with education materials and successful outreach letter.  Outreach client in 5-6 months (per Tier 2)  Jacqlyn Larsen [facility], BSN [place], [place]

## 2020-06-20 NOTE — Progress Notes (Signed)
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 ==========================================================  -  GFR - Kidney Filtering efficiency is 14   - the same over the last 6 to 7 months - Great  ==========================================================  -  Total Chol = 158 and LDL Chol = 79 - Both Great  ==========================================================  -  But A1c - Worse - Up from 7.6% 3 months ago  (which was too high ) to   Now 8.4% which is even worse - You must try harder with your diet & lose weight, It's the only thing that can help your Kidneys !   Being diabetic has a  300% increased risk for heart attack,  stroke, cancer, and alzheimer- type vascular dementia.   It is very important that you work harder with diet by  avoiding all foods that are white except chicken,   fish & calliflower.  - Avoid white rice  (brown & wild rice is OK),   - Avoid white potatoes  (sweet potatoes in moderation is OK),   White bread or wheat bread or anything made out of   white flour like bagels, donuts, rolls, buns, biscuits, cakes,  - pastries, cookies, pizza crust, and pasta (made from  white flour & egg whites)   - vegetarian pasta or spinach or wheat pasta is OK.  - Multigrain breads like Arnold's, Pepperidge Farm or   multigrain sandwich thins or high fiber breads like   [place] bread or "Dave's Killer" breads that are  4 to 5 grams fiber per slice !  are best.   ==========================================================  -  Vitamin D = 59    is Great   ==========================================================  -

## 2020-06-21 ENCOUNTER — Encounter: Payer: Self-pay | Admitting: Podiatry

## 2020-06-21 ENCOUNTER — Ambulatory Visit: Payer: HMO | Admitting: Podiatry

## 2020-06-21 ENCOUNTER — Other Ambulatory Visit: Payer: Self-pay

## 2020-06-21 DIAGNOSIS — R609 Edema, unspecified: Secondary | ICD-10-CM

## 2020-06-21 DIAGNOSIS — D689 Coagulation defect, unspecified: Secondary | ICD-10-CM | POA: Diagnosis not present

## 2020-06-21 NOTE — Progress Notes (Signed)
Subjective:   Patient ID: Ashley [name], female   DOB: 74 y.o.   MRN: [id number]   HPI Patient states that she has had quite a bit of swelling in her right foot and is she is currently on Coumadin for blood clot in her groin right   ROS      Objective:  Physical Exam  Neurovascular status found to be unchanged with negative [name] sign noted.  Patient does have quite a bit of swelling to the right forefoot into the midfoot and ankle that is localized and states she is dealt with this for a long time but it is gotten worse recently.  States over the last few weeks it is worsened     Assessment:  Edema which appears to be localized with patient under current treatment for previous blood clot     Plan:  H&P condition reviewed and at this point organ to try to reduce the swelling in the foot with compression and I applied an Unna boot Ace wrap surgical shoe and then will use compression stocking.  Discussed elevation and the continuation of treatment for her proximal blood clot which hopefully will also be of help to this condition.  Strict instructions if any issues with the toes or increased swelling were to occur to take the Unna boot off early

## 2020-06-28 ENCOUNTER — Ambulatory Visit (INDEPENDENT_AMBULATORY_CARE_PROVIDER_SITE_OTHER): Payer: HMO | Admitting: Pharmacist Clinician (PhC)/ Clinical Pharmacy Specialist

## 2020-06-28 DIAGNOSIS — Z7901 Long term (current) use of anticoagulants: Secondary | ICD-10-CM | POA: Diagnosis not present

## 2020-06-28 DIAGNOSIS — I82411 Acute embolism and thrombosis of right femoral vein: Secondary | ICD-10-CM

## 2020-06-28 LAB — PROTIME-INR
INR: 7 (ref 0.9–1.2)
Prothrombin Time: 69.3 s — ABNORMAL HIGH (ref 9.1–12.0)

## 2020-06-28 LAB — POCT INR: INR: 6.3 — AB (ref 2.0–3.0)

## 2020-07-01 ENCOUNTER — Other Ambulatory Visit: Payer: Self-pay | Admitting: Internal Medicine

## 2020-07-03 ENCOUNTER — Ambulatory Visit: Payer: HMO | Admitting: Podiatry

## 2020-07-05 ENCOUNTER — Ambulatory Visit (INDEPENDENT_AMBULATORY_CARE_PROVIDER_SITE_OTHER): Payer: HMO | Admitting: Pharmacist Clinician (PhC)/ Clinical Pharmacy Specialist

## 2020-07-05 ENCOUNTER — Other Ambulatory Visit: Payer: Self-pay | Admitting: Internal Medicine

## 2020-07-05 DIAGNOSIS — I82411 Acute embolism and thrombosis of right femoral vein: Secondary | ICD-10-CM

## 2020-07-05 DIAGNOSIS — Z7901 Long term (current) use of anticoagulants: Secondary | ICD-10-CM

## 2020-07-05 LAB — POCT INR: INR: 3.2 — AB (ref 2.0–3.0)

## 2020-07-09 ENCOUNTER — Ambulatory Visit: Payer: HMO | Admitting: Internal Medicine

## 2020-07-10 ENCOUNTER — Ambulatory Visit: Payer: HMO | Admitting: Podiatry

## 2020-07-17 ENCOUNTER — Other Ambulatory Visit: Payer: Self-pay

## 2020-07-17 ENCOUNTER — Ambulatory Visit (INDEPENDENT_AMBULATORY_CARE_PROVIDER_SITE_OTHER): Payer: HMO | Admitting: Pharmacist Clinician (PhC)/ Clinical Pharmacy Specialist

## 2020-07-17 DIAGNOSIS — I82411 Acute embolism and thrombosis of right femoral vein: Secondary | ICD-10-CM | POA: Diagnosis not present

## 2020-07-17 DIAGNOSIS — Z7901 Long term (current) use of anticoagulants: Secondary | ICD-10-CM | POA: Diagnosis not present

## 2020-07-17 LAB — POCT INR: INR: 4.4 — AB (ref 2.0–3.0)

## 2020-07-18 ENCOUNTER — Other Ambulatory Visit: Payer: Self-pay | Admitting: Internal Medicine

## 2020-07-18 ENCOUNTER — Other Ambulatory Visit: Payer: Self-pay | Admitting: *Deleted

## 2020-07-18 DIAGNOSIS — D649 Anemia, unspecified: Secondary | ICD-10-CM

## 2020-07-18 NOTE — Patient Outreach (Signed)
  [facility]) Care Management Chronic Special Needs Program    07/18/2020  Name: Ashley [name], DOB: 23-Jun-1946  MRN: [id number]   Ms. Ashley [name] is enrolled in a chronic special needs plan for Diabetes.  [facility] care management will continue to provide services for this member through 08/17/20.  The [employer] care management team will assume care 08/18/20.   Jacqlyn Larsen [facility], BSN [place], [place]

## 2020-07-31 ENCOUNTER — Other Ambulatory Visit: Payer: Self-pay

## 2020-07-31 ENCOUNTER — Ambulatory Visit (INDEPENDENT_AMBULATORY_CARE_PROVIDER_SITE_OTHER): Payer: HMO | Admitting: Pharmacist

## 2020-07-31 DIAGNOSIS — I82411 Acute embolism and thrombosis of right femoral vein: Secondary | ICD-10-CM | POA: Diagnosis not present

## 2020-07-31 DIAGNOSIS — Z7901 Long term (current) use of anticoagulants: Secondary | ICD-10-CM | POA: Diagnosis not present

## 2020-07-31 LAB — POCT INR: INR: 5.3 — AB (ref 2.0–3.0)

## 2020-08-02 ENCOUNTER — Ambulatory Visit
Admission: RE | Admit: 2020-08-02 | Discharge: 2020-08-02 | Disposition: A | Discharge status: Home / Self Care | Payer: HMO | Source: Ambulatory Visit | Attending: Radiation Oncology | Admitting: Radiation Oncology

## 2020-08-02 ENCOUNTER — Ambulatory Visit (HOSPITAL_COMMUNITY): Payer: HMO

## 2020-08-02 ENCOUNTER — Telehealth: Payer: Self-pay

## 2020-08-02 NOTE — Telephone Encounter (Signed)
1145 am Returned patient's call this morning. She states she was sick yesterday and today and had to cancel her radiology appointment and is canceling her f/u with Dr. [name] today. Patient requests she get a call back on 12/20 to r/s her appointment.

## 2020-08-14 ENCOUNTER — Ambulatory Visit (HOSPITAL_COMMUNITY)
Admission: RE | Admit: 2020-08-14 | Payer: HMO | Source: Ambulatory Visit | Attending: Internal Medicine | Admitting: Internal Medicine

## 2020-08-16 ENCOUNTER — Ambulatory Visit (INDEPENDENT_AMBULATORY_CARE_PROVIDER_SITE_OTHER): Payer: HMO | Admitting: Pharmacist Clinician (PhC)/ Clinical Pharmacy Specialist

## 2020-08-16 ENCOUNTER — Other Ambulatory Visit: Payer: Self-pay

## 2020-08-16 DIAGNOSIS — I82411 Acute embolism and thrombosis of right femoral vein: Secondary | ICD-10-CM | POA: Diagnosis not present

## 2020-08-16 DIAGNOSIS — Z7901 Long term (current) use of anticoagulants: Secondary | ICD-10-CM | POA: Diagnosis not present

## 2020-08-16 LAB — POCT INR: INR: 2.7 (ref 2.0–3.0)

## 2020-08-22 ENCOUNTER — Other Ambulatory Visit: Payer: Self-pay | Admitting: Adult Health

## 2020-08-22 ENCOUNTER — Other Ambulatory Visit: Payer: Self-pay | Admitting: *Deleted

## 2020-08-24 ENCOUNTER — Encounter: Payer: Self-pay | Admitting: Gastroenterology

## 2020-08-27 ENCOUNTER — Ambulatory Visit: Payer: HMO | Admitting: Gastroenterology

## 2020-08-27 ENCOUNTER — Encounter: Payer: Self-pay | Admitting: Gastroenterology

## 2020-08-27 VITALS — BP 180/80 | HR 80 | Ht 63.5 in | Wt 145.8 lb

## 2020-08-27 DIAGNOSIS — R112 Nausea with vomiting, unspecified: Secondary | ICD-10-CM

## 2020-08-27 DIAGNOSIS — R1084 Generalized abdominal pain: Secondary | ICD-10-CM | POA: Diagnosis not present

## 2020-08-27 DIAGNOSIS — K5900 Constipation, unspecified: Secondary | ICD-10-CM

## 2020-08-27 NOTE — Patient Instructions (Signed)
If you are age 74 or older, your body mass index should be between 23-30. Your Body mass index is 25.42 kg/m. If this is out of the aforementioned range listed, please consider follow up with your Primary Care Provider.  If you are age 60 or younger, your body mass index should be between 19-25. Your Body mass index is 25.42 kg/m. If this is out of the aformentioned range listed, please consider follow up with your Primary Care Provider.   You have been scheduled for a CT scan of the abdomen and pelvis at [facility], 1st floor Radiology. You are scheduled on 09/06/2020  at 3:00 pm. You should arrive 15 minutes prior to your appointment time for registration.  Please pick up 2 bottles of contrast from [place] at least 3 days prior to your scan. The solution may taste better if refrigerated, but do NOT add ice or any other liquid to this solution. Shake well before drinking.   Please follow the written instructions below on the day of your exam:   1) Do not eat anything after 10:00 am (4 hours prior to your test)   You may take any medications as prescribed with a small amount of water, if necessary. If you take any of the following medications: METFORMIN, GLUCOPHAGE, GLUCOVANCE, AVANDAMET, RIOMET, FORTAMET, [place] MET, JANUMET, GLUMETZA or METAGLIP, you MAY be asked to HOLD this medication 48 hours AFTER the exam.   The purpose of you drinking the oral contrast is to aid in the visualization of your intestinal tract. The contrast solution may cause some diarrhea. Depending on your individual set of symptoms, you may also receive an intravenous injection of x-ray contrast/dye. Plan on being at [facility] for 45 minutes or longer, depending on the type of exam you are having performed.   If you have any questions regarding your exam or if you need to reschedule, you may call [facility] at [phone] between the hours of 8:00 am and 5:00 pm, Monday-Friday.   START 1 capful  of Miralax in 8 ounces of water of juice daily for constipation and dark stools.  Follow up pending the results of your CT or as needed.  Thank you for entrusting me with your care and choosing [facility].  Alonza Bogus, PA-C
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
## 2020-08-30 ENCOUNTER — Other Ambulatory Visit: Payer: Self-pay

## 2020-08-30 ENCOUNTER — Ambulatory Visit (INDEPENDENT_AMBULATORY_CARE_PROVIDER_SITE_OTHER): Payer: HMO | Admitting: Pharmacist Clinician (PhC)/ Clinical Pharmacy Specialist

## 2020-08-30 DIAGNOSIS — Z7901 Long term (current) use of anticoagulants: Secondary | ICD-10-CM

## 2020-08-30 DIAGNOSIS — I82411 Acute embolism and thrombosis of right femoral vein: Secondary | ICD-10-CM | POA: Diagnosis not present

## 2020-08-30 LAB — POCT INR: INR: 3.4 — AB (ref 2.0–3.0)

## 2020-08-30 NOTE — Progress Notes (Signed)
I agree with the above note, plan 

## 2020-09-05 ENCOUNTER — Other Ambulatory Visit: Payer: Self-pay | Admitting: Adult Health

## 2020-09-05 DIAGNOSIS — E039 Hypothyroidism, unspecified: Secondary | ICD-10-CM

## 2020-09-06 ENCOUNTER — Ambulatory Visit (HOSPITAL_COMMUNITY): Payer: HMO

## 2020-09-11 ENCOUNTER — Other Ambulatory Visit: Payer: Self-pay

## 2020-09-11 ENCOUNTER — Ambulatory Visit (INDEPENDENT_AMBULATORY_CARE_PROVIDER_SITE_OTHER): Payer: HMO | Admitting: Pharmacist Clinician (PhC)/ Clinical Pharmacy Specialist

## 2020-09-11 DIAGNOSIS — I82411 Acute embolism and thrombosis of right femoral vein: Secondary | ICD-10-CM | POA: Diagnosis not present

## 2020-09-11 DIAGNOSIS — Z7901 Long term (current) use of anticoagulants: Secondary | ICD-10-CM | POA: Diagnosis not present

## 2020-09-11 LAB — POCT INR: INR: 2.6 (ref 2.0–3.0)

## 2020-09-13 ENCOUNTER — Other Ambulatory Visit: Payer: Self-pay | Admitting: Internal Medicine

## 2020-09-13 DIAGNOSIS — I10 Essential (primary) hypertension: Secondary | ICD-10-CM

## 2020-09-19 ENCOUNTER — Ambulatory Visit (HOSPITAL_COMMUNITY)
Admission: RE | Admit: 2020-09-19 | Discharge: 2020-09-19 | Disposition: A | Discharge status: Home / Self Care | Payer: HMO | Source: Ambulatory Visit | Attending: Gastroenterology | Admitting: Gastroenterology

## 2020-09-19 ENCOUNTER — Other Ambulatory Visit: Payer: Self-pay

## 2020-09-19 DIAGNOSIS — K5900 Constipation, unspecified: Secondary | ICD-10-CM

## 2020-09-19 DIAGNOSIS — R112 Nausea with vomiting, unspecified: Secondary | ICD-10-CM

## 2020-09-19 DIAGNOSIS — R1084 Generalized abdominal pain: Secondary | ICD-10-CM

## 2020-09-23 LAB — POCT INR: INR: 2.3 (ref 2.0–3.0)

## 2020-09-24 ENCOUNTER — Other Ambulatory Visit: Payer: Self-pay | Admitting: Internal Medicine

## 2020-09-24 ENCOUNTER — Ambulatory Visit (INDEPENDENT_AMBULATORY_CARE_PROVIDER_SITE_OTHER): Payer: HMO | Admitting: Cardiology

## 2020-09-24 DIAGNOSIS — Z7901 Long term (current) use of anticoagulants: Secondary | ICD-10-CM

## 2020-09-24 DIAGNOSIS — I10 Essential (primary) hypertension: Secondary | ICD-10-CM

## 2020-09-24 DIAGNOSIS — I82411 Acute embolism and thrombosis of right femoral vein: Secondary | ICD-10-CM | POA: Diagnosis not present

## 2020-09-25 ENCOUNTER — Other Ambulatory Visit: Payer: Self-pay | Admitting: Internal Medicine

## 2020-10-02 ENCOUNTER — Ambulatory Visit: Payer: HMO | Admitting: Podiatry

## 2020-10-04 ENCOUNTER — Encounter (HOSPITAL_COMMUNITY): Payer: HMO

## 2020-10-08 ENCOUNTER — Inpatient Hospital Stay (HOSPITAL_COMMUNITY)
Admission: EM | Admit: 2020-10-08 | Discharge: 2020-10-14 | DRG: 683 | Disposition: A | Discharge status: Home Health | Payer: HMO | Attending: Internal Medicine | Admitting: Internal Medicine

## 2020-10-08 ENCOUNTER — Encounter (HOSPITAL_COMMUNITY): Payer: Self-pay

## 2020-10-08 DIAGNOSIS — K5909 Other constipation: Secondary | ICD-10-CM | POA: Diagnosis present

## 2020-10-08 DIAGNOSIS — Z79899 Other long term (current) drug therapy: Secondary | ICD-10-CM

## 2020-10-08 DIAGNOSIS — N184 Chronic kidney disease, stage 4 (severe): Secondary | ICD-10-CM | POA: Diagnosis present

## 2020-10-08 DIAGNOSIS — I739 Peripheral vascular disease, unspecified: Secondary | ICD-10-CM | POA: Diagnosis present

## 2020-10-08 DIAGNOSIS — M109 Gout, unspecified: Secondary | ICD-10-CM | POA: Diagnosis present

## 2020-10-08 DIAGNOSIS — Z79890 Hormone replacement therapy (postmenopausal): Secondary | ICD-10-CM

## 2020-10-08 DIAGNOSIS — G8929 Other chronic pain: Secondary | ICD-10-CM | POA: Diagnosis present

## 2020-10-08 DIAGNOSIS — E1122 Type 2 diabetes mellitus with diabetic chronic kidney disease: Secondary | ICD-10-CM | POA: Diagnosis present

## 2020-10-08 DIAGNOSIS — K219 Gastro-esophageal reflux disease without esophagitis: Secondary | ICD-10-CM | POA: Diagnosis present

## 2020-10-08 DIAGNOSIS — E669 Obesity, unspecified: Secondary | ICD-10-CM | POA: Diagnosis present

## 2020-10-08 DIAGNOSIS — F319 Bipolar disorder, unspecified: Secondary | ICD-10-CM | POA: Diagnosis present

## 2020-10-08 DIAGNOSIS — E1142 Type 2 diabetes mellitus with diabetic polyneuropathy: Secondary | ICD-10-CM | POA: Diagnosis present

## 2020-10-08 DIAGNOSIS — Z8673 Personal history of transient ischemic attack (TIA), and cerebral infarction without residual deficits: Secondary | ICD-10-CM

## 2020-10-08 DIAGNOSIS — E871 Hypo-osmolality and hyponatremia: Secondary | ICD-10-CM | POA: Diagnosis present

## 2020-10-08 DIAGNOSIS — N179 Acute kidney failure, unspecified: Principal | ICD-10-CM | POA: Diagnosis present

## 2020-10-08 DIAGNOSIS — R339 Retention of urine, unspecified: Secondary | ICD-10-CM

## 2020-10-08 DIAGNOSIS — E785 Hyperlipidemia, unspecified: Secondary | ICD-10-CM | POA: Diagnosis present

## 2020-10-08 DIAGNOSIS — N185 Chronic kidney disease, stage 5: Secondary | ICD-10-CM | POA: Diagnosis present

## 2020-10-08 DIAGNOSIS — R1084 Generalized abdominal pain: Secondary | ICD-10-CM | POA: Diagnosis present

## 2020-10-08 DIAGNOSIS — Z7901 Long term (current) use of anticoagulants: Secondary | ICD-10-CM

## 2020-10-08 DIAGNOSIS — R112 Nausea with vomiting, unspecified: Secondary | ICD-10-CM

## 2020-10-08 DIAGNOSIS — Z7982 Long term (current) use of aspirin: Secondary | ICD-10-CM

## 2020-10-08 DIAGNOSIS — E7800 Pure hypercholesterolemia, unspecified: Secondary | ICD-10-CM | POA: Diagnosis present

## 2020-10-08 DIAGNOSIS — I129 Hypertensive chronic kidney disease with stage 1 through stage 4 chronic kidney disease, or unspecified chronic kidney disease: Secondary | ICD-10-CM | POA: Diagnosis present

## 2020-10-08 DIAGNOSIS — J438 Other emphysema: Secondary | ICD-10-CM | POA: Diagnosis present

## 2020-10-08 DIAGNOSIS — Z85118 Personal history of other malignant neoplasm of bronchus and lung: Secondary | ICD-10-CM

## 2020-10-08 DIAGNOSIS — IMO0002 Reserved for concepts with insufficient information to code with codable children: Secondary | ICD-10-CM | POA: Diagnosis present

## 2020-10-08 DIAGNOSIS — E872 Acidosis: Secondary | ICD-10-CM | POA: Diagnosis present

## 2020-10-08 DIAGNOSIS — K5900 Constipation, unspecified: Secondary | ICD-10-CM | POA: Diagnosis present

## 2020-10-08 DIAGNOSIS — E860 Dehydration: Secondary | ICD-10-CM | POA: Diagnosis not present

## 2020-10-08 DIAGNOSIS — D509 Iron deficiency anemia, unspecified: Secondary | ICD-10-CM | POA: Diagnosis present

## 2020-10-08 DIAGNOSIS — Z90710 Acquired absence of both cervix and uterus: Secondary | ICD-10-CM

## 2020-10-08 DIAGNOSIS — F429 Obsessive-compulsive disorder, unspecified: Secondary | ICD-10-CM | POA: Diagnosis present

## 2020-10-08 DIAGNOSIS — E039 Hypothyroidism, unspecified: Secondary | ICD-10-CM | POA: Diagnosis present

## 2020-10-08 DIAGNOSIS — E1165 Type 2 diabetes mellitus with hyperglycemia: Secondary | ICD-10-CM | POA: Diagnosis present

## 2020-10-08 DIAGNOSIS — R791 Abnormal coagulation profile: Secondary | ICD-10-CM

## 2020-10-08 DIAGNOSIS — D72828 Other elevated white blood cell count: Secondary | ICD-10-CM | POA: Diagnosis present

## 2020-10-08 DIAGNOSIS — J449 Chronic obstructive pulmonary disease, unspecified: Secondary | ICD-10-CM | POA: Diagnosis present

## 2020-10-08 DIAGNOSIS — I160 Hypertensive urgency: Secondary | ICD-10-CM | POA: Diagnosis present

## 2020-10-08 DIAGNOSIS — F419 Anxiety disorder, unspecified: Secondary | ICD-10-CM | POA: Diagnosis present

## 2020-10-08 DIAGNOSIS — Z8544 Personal history of malignant neoplasm of other female genital organs: Secondary | ICD-10-CM

## 2020-10-08 DIAGNOSIS — Z20822 Contact with and (suspected) exposure to covid-19: Secondary | ICD-10-CM | POA: Diagnosis present

## 2020-10-08 DIAGNOSIS — F17210 Nicotine dependence, cigarettes, uncomplicated: Secondary | ICD-10-CM | POA: Diagnosis present

## 2020-10-08 DIAGNOSIS — F3130 Bipolar disorder, current episode depressed, mild or moderate severity, unspecified: Secondary | ICD-10-CM | POA: Diagnosis present

## 2020-10-08 DIAGNOSIS — E1151 Type 2 diabetes mellitus with diabetic peripheral angiopathy without gangrene: Secondary | ICD-10-CM | POA: Diagnosis present

## 2020-10-08 DIAGNOSIS — Z794 Long term (current) use of insulin: Secondary | ICD-10-CM

## 2020-10-08 DIAGNOSIS — R109 Unspecified abdominal pain: Secondary | ICD-10-CM

## 2020-10-08 DIAGNOSIS — Z86718 Personal history of other venous thrombosis and embolism: Secondary | ICD-10-CM

## 2020-10-08 DIAGNOSIS — E876 Hypokalemia: Secondary | ICD-10-CM | POA: Diagnosis present

## 2020-10-08 LAB — CBC
HCT: 38.2 % (ref 36.0–46.0)
Hemoglobin: 13.6 g/dL (ref 12.0–15.0)
MCH: 30.1 pg (ref 26.0–34.0)
MCHC: 35.6 g/dL (ref 30.0–36.0)
MCV: 84.5 fL (ref 80.0–100.0)
Platelets: 588 10*3/uL — ABNORMAL HIGH (ref 150–400)
RBC: 4.52 MIL/uL (ref 3.87–5.11)
RDW: 13.2 % (ref 11.5–15.5)
WBC: 12.7 10*3/uL — ABNORMAL HIGH (ref 4.0–10.5)
nRBC: 0 % (ref 0.0–0.2)

## 2020-10-08 LAB — COMPREHENSIVE METABOLIC PANEL
ALT: 35 U/L (ref 0–44)
AST: 32 U/L (ref 15–41)
Albumin: 3.3 g/dL — ABNORMAL LOW (ref 3.5–5.0)
Alkaline Phosphatase: 51 U/L (ref 38–126)
Anion gap: 20 — ABNORMAL HIGH (ref 5–15)
BUN: 99 mg/dL — ABNORMAL HIGH (ref 8–23)
CO2: 25 mmol/L (ref 22–32)
Calcium: 9.3 mg/dL (ref 8.9–10.3)
Chloride: 85 mmol/L — ABNORMAL LOW (ref 98–111)
Creatinine, Ser: 4.59 mg/dL — ABNORMAL HIGH (ref 0.44–1.00)
GFR, Estimated: 10 mL/min — ABNORMAL LOW (ref >60)
Glucose, Bld: 170 mg/dL — ABNORMAL HIGH (ref 70–99)
Potassium: 2.1 mmol/L — CL (ref 3.5–5.1)
Sodium: 130 mmol/L — ABNORMAL LOW (ref 135–145)
Total Bilirubin: 0.5 mg/dL (ref 0.3–1.2)
Total Protein: 6.9 g/dL (ref 6.5–8.1)

## 2020-10-08 LAB — LIPASE, BLOOD: Lipase: 32 U/L (ref 11–51)

## 2020-10-08 NOTE — ED Triage Notes (Signed)
Pt reports that she has been constipated for the past several weeks, some n/v

## 2020-10-09 ENCOUNTER — Observation Stay (HOSPITAL_COMMUNITY): Payer: HMO

## 2020-10-09 ENCOUNTER — Inpatient Hospital Stay (HOSPITAL_COMMUNITY): Admission: RE | Admit: 2020-10-09 | Payer: HMO | Source: Ambulatory Visit

## 2020-10-09 ENCOUNTER — Encounter (HOSPITAL_COMMUNITY): Payer: Self-pay | Admitting: Internal Medicine

## 2020-10-09 ENCOUNTER — Other Ambulatory Visit: Payer: Self-pay

## 2020-10-09 DIAGNOSIS — R791 Abnormal coagulation profile: Secondary | ICD-10-CM | POA: Insufficient documentation

## 2020-10-09 DIAGNOSIS — K5900 Constipation, unspecified: Secondary | ICD-10-CM

## 2020-10-09 DIAGNOSIS — N185 Chronic kidney disease, stage 5: Secondary | ICD-10-CM

## 2020-10-09 DIAGNOSIS — R1084 Generalized abdominal pain: Secondary | ICD-10-CM

## 2020-10-09 DIAGNOSIS — E876 Hypokalemia: Secondary | ICD-10-CM

## 2020-10-09 DIAGNOSIS — N184 Chronic kidney disease, stage 4 (severe): Secondary | ICD-10-CM | POA: Diagnosis not present

## 2020-10-09 DIAGNOSIS — F319 Bipolar disorder, unspecified: Secondary | ICD-10-CM

## 2020-10-09 DIAGNOSIS — N170 Acute kidney failure with tubular necrosis: Secondary | ICD-10-CM | POA: Diagnosis not present

## 2020-10-09 DIAGNOSIS — N179 Acute kidney failure, unspecified: Secondary | ICD-10-CM | POA: Diagnosis not present

## 2020-10-09 DIAGNOSIS — R1010 Upper abdominal pain, unspecified: Secondary | ICD-10-CM

## 2020-10-09 LAB — URINALYSIS, ROUTINE W REFLEX MICROSCOPIC
Bilirubin Urine: NEGATIVE
Glucose, UA: >=500 mg/dL — AB
Hgb urine dipstick: NEGATIVE
Ketones, ur: NEGATIVE mg/dL
Leukocytes,Ua: NEGATIVE
Nitrite: NEGATIVE
Protein, ur: >=300 mg/dL — AB
Specific Gravity, Urine: 1.012 (ref 1.005–1.030)
pH: 5 (ref 5.0–8.0)

## 2020-10-09 LAB — BASIC METABOLIC PANEL
Anion gap: 16 — ABNORMAL HIGH (ref 5–15)
BUN: 103 mg/dL — ABNORMAL HIGH (ref 8–23)
CO2: 21 mmol/L — ABNORMAL LOW (ref 22–32)
Calcium: 8.8 mg/dL — ABNORMAL LOW (ref 8.9–10.3)
Chloride: 95 mmol/L — ABNORMAL LOW (ref 98–111)
Creatinine, Ser: 4.36 mg/dL — ABNORMAL HIGH (ref 0.44–1.00)
GFR, Estimated: 10 mL/min — ABNORMAL LOW (ref >60)
Glucose, Bld: 94 mg/dL (ref 70–99)
Potassium: 2.3 mmol/L — CL (ref 3.5–5.1)
Sodium: 132 mmol/L — ABNORMAL LOW (ref 135–145)

## 2020-10-09 LAB — CBC
HCT: 37.2 % (ref 36.0–46.0)
Hemoglobin: 12.7 g/dL (ref 12.0–15.0)
MCH: 29 pg (ref 26.0–34.0)
MCHC: 34.1 g/dL (ref 30.0–36.0)
MCV: 84.9 fL (ref 80.0–100.0)
Platelets: 427 10*3/uL — ABNORMAL HIGH (ref 150–400)
RBC: 4.38 MIL/uL (ref 3.87–5.11)
RDW: 13.2 % (ref 11.5–15.5)
WBC: 13.1 10*3/uL — ABNORMAL HIGH (ref 4.0–10.5)
nRBC: 0 % (ref 0.0–0.2)

## 2020-10-09 LAB — PROTIME-INR
INR: 6.9 (ref 0.8–1.2)
INR: 8.2 (ref 0.8–1.2)
INR: 2.6 — ABNORMAL HIGH (ref 0.8–1.2)
Prothrombin Time: 57.6 seconds — ABNORMAL HIGH (ref 11.4–15.2)
Prothrombin Time: 66.1 seconds — ABNORMAL HIGH (ref 11.4–15.2)
Prothrombin Time: 27.2 seconds — ABNORMAL HIGH (ref 11.4–15.2)

## 2020-10-09 LAB — RESP PANEL BY RT-PCR (FLU A&B, COVID) ARPGX2
Influenza A by PCR: NEGATIVE
Influenza B by PCR: NEGATIVE
SARS Coronavirus 2 by RT PCR: NEGATIVE

## 2020-10-09 LAB — GLUCOSE, CAPILLARY: Glucose-Capillary: 284 mg/dL — ABNORMAL HIGH (ref 70–99)

## 2020-10-09 LAB — HEMOGLOBIN A1C
Hgb A1c MFr Bld: 8.8 % — ABNORMAL HIGH (ref 4.8–5.6)
Mean Plasma Glucose: 205.86 mg/dL

## 2020-10-09 LAB — POTASSIUM: Potassium: 2.6 mmol/L — CL (ref 3.5–5.1)

## 2020-10-09 LAB — MAGNESIUM: Magnesium: 2.8 mg/dL — ABNORMAL HIGH (ref 1.7–2.4)

## 2020-10-09 LAB — LACTIC ACID, PLASMA: Lactic Acid, Venous: 1.2 mmol/L (ref 0.5–1.9)

## 2020-10-09 LAB — TSH: TSH: 0.392 u[IU]/mL (ref 0.350–4.500)

## 2020-10-09 MED ORDER — LEVOTHYROXINE SODIUM 88 MCG PO TABS: 88.0000 ug | ORAL_TABLET | Freq: Every day | ORAL | Status: DC

## 2020-10-09 MED ORDER — POLYETHYLENE GLYCOL 3350 17 G PO PACK: 17.0000 g | PACK | Freq: Every day | ORAL | Status: DC | PRN

## 2020-10-09 MED ORDER — FAMOTIDINE 20 MG PO TABS
20.0000 mg | ORAL_TABLET | Freq: Two times a day (BID) | ORAL | Status: DC
  Administered 2020-10-09: 20 mg via ORAL
  Filled 2020-10-09 (×2): qty 1

## 2020-10-09 MED ORDER — POTASSIUM CHLORIDE CRYS ER 20 MEQ PO TBCR
60.0000 meq | EXTENDED_RELEASE_TABLET | Freq: Once | ORAL | Status: AC
  Administered 2020-10-09: 60 meq via ORAL
  Filled 2020-10-09: qty 3

## 2020-10-09 MED ORDER — POTASSIUM CHLORIDE CRYS ER 20 MEQ PO TBCR: 40.0000 meq | EXTENDED_RELEASE_TABLET | Freq: Once | ORAL | Status: DC

## 2020-10-09 MED ORDER — VITAMIN K1 10 MG/ML IJ SOLN: 5.0000 mg | Freq: Once | INTRAMUSCULAR | Status: DC

## 2020-10-09 MED ORDER — SENNOSIDES-DOCUSATE SODIUM 8.6-50 MG PO TABS
1.0000 | ORAL_TABLET | Freq: Two times a day (BID) | ORAL | Status: DC
  Administered 2020-10-09 – 2020-10-13 (×8): 1 via ORAL
  Filled 2020-10-09 (×8): qty 1

## 2020-10-09 MED ORDER — SODIUM CHLORIDE 0.9 % IV BOLUS
1000.0000 mL | Freq: Once | INTRAVENOUS | Status: AC
  Administered 2020-10-09: 1000 mL via INTRAVENOUS

## 2020-10-09 MED ORDER — POTASSIUM CHLORIDE 10 MEQ/100ML IV SOLN
10.0000 meq | INTRAVENOUS | Status: AC
  Administered 2020-10-09 (×3): 10 meq via INTRAVENOUS
  Filled 2020-10-09 (×3): qty 100

## 2020-10-09 MED ORDER — PANTOPRAZOLE SODIUM 40 MG PO TBEC
40.0000 mg | DELAYED_RELEASE_TABLET | Freq: Two times a day (BID) | ORAL | Status: DC
  Administered 2020-10-09 – 2020-10-10 (×2): 40 mg via ORAL
  Filled 2020-10-09 (×2): qty 1

## 2020-10-09 MED ORDER — INSULIN ASPART 100 UNIT/ML ~~LOC~~ SOLN
0.0000 [IU] | Freq: Three times a day (TID) | SUBCUTANEOUS | Status: DC
  Administered 2020-10-10 (×2): 3 [IU] via SUBCUTANEOUS
  Administered 2020-10-10 – 2020-10-11 (×2): 1 [IU] via SUBCUTANEOUS
  Administered 2020-10-11: 3 [IU] via SUBCUTANEOUS
  Administered 2020-10-12: 1 [IU] via SUBCUTANEOUS
  Administered 2020-10-12: 2 [IU] via SUBCUTANEOUS
  Administered 2020-10-12 – 2020-10-13 (×2): 3 [IU] via SUBCUTANEOUS
  Administered 2020-10-13: 2 [IU] via SUBCUTANEOUS
  Administered 2020-10-13: 3 [IU] via SUBCUTANEOUS

## 2020-10-09 MED ORDER — DOCUSATE SODIUM 100 MG PO CAPS: 100.0000 mg | ORAL_CAPSULE | Freq: Two times a day (BID) | ORAL | Status: DC

## 2020-10-09 MED ORDER — FENOFIBRATE 54 MG PO TABS
54.0000 mg | ORAL_TABLET | Freq: Every day | ORAL | Status: DC
  Administered 2020-10-09 – 2020-10-14 (×6): 54 mg via ORAL
  Filled 2020-10-09 (×6): qty 1

## 2020-10-09 MED ORDER — CLORAZEPATE DIPOTASSIUM 3.75 MG PO TABS: 3.7500 mg | ORAL_TABLET | Freq: Two times a day (BID) | ORAL | Status: DC | PRN

## 2020-10-09 MED ORDER — LEVOTHYROXINE SODIUM 88 MCG PO TABS
88.0000 ug | ORAL_TABLET | Freq: Every day | ORAL | Status: DC
  Administered 2020-10-10 – 2020-10-14 (×5): 88 ug via ORAL
  Filled 2020-10-09 (×5): qty 1

## 2020-10-09 MED ORDER — HYDRALAZINE HCL 20 MG/ML IJ SOLN
INTRAMUSCULAR | Status: AC
  Administered 2020-10-09: 10 mg via INTRAVENOUS
  Filled 2020-10-09: qty 1

## 2020-10-09 MED ORDER — HYDRALAZINE HCL 20 MG/ML IJ SOLN: 10.0000 mg | Freq: Once | INTRAMUSCULAR | Status: AC

## 2020-10-09 MED ORDER — POTASSIUM CHLORIDE CRYS ER 10 MEQ PO TBCR
10.0000 meq | EXTENDED_RELEASE_TABLET | Freq: Three times a day (TID) | ORAL | Status: DC
  Administered 2020-10-09 – 2020-10-11 (×7): 10 meq via ORAL
  Filled 2020-10-09 (×8): qty 1

## 2020-10-09 MED ORDER — CHLORHEXIDINE GLUCONATE CLOTH 2 % EX PADS
6.0000 | MEDICATED_PAD | Freq: Every day | CUTANEOUS | Status: DC
  Administered 2020-10-10 – 2020-10-13 (×4): 6 via TOPICAL

## 2020-10-09 MED ORDER — VITAMIN K1 10 MG/ML IJ SOLN
1.0000 mg | Freq: Once | INTRAVENOUS | Status: AC
  Administered 2020-10-09: 1 mg via INTRAVENOUS
  Filled 2020-10-09: qty 0.1

## 2020-10-09 MED ORDER — FLUOXETINE HCL 40 MG PO CAPS: 40.0000 mg | ORAL_CAPSULE | Freq: Every day | ORAL | Status: DC

## 2020-10-09 MED ORDER — LORAZEPAM 2 MG/ML IJ SOLN
0.5000 mg | Freq: Once | INTRAMUSCULAR | Status: AC
  Administered 2020-10-09: 0.5 mg via INTRAVENOUS
  Filled 2020-10-09: qty 1

## 2020-10-09 MED ORDER — HYDRALAZINE HCL 20 MG/ML IJ SOLN
10.0000 mg | Freq: Once | INTRAMUSCULAR | Status: AC
  Administered 2020-10-09: 10 mg via INTRAVENOUS
  Filled 2020-10-09: qty 1

## 2020-10-09 MED ORDER — LACTATED RINGERS IV SOLN: INTRAVENOUS | Status: AC

## 2020-10-09 MED ORDER — ATORVASTATIN CALCIUM 40 MG PO TABS
40.0000 mg | ORAL_TABLET | Freq: Every day | ORAL | Status: DC
  Administered 2020-10-09 – 2020-10-14 (×6): 40 mg via ORAL
  Filled 2020-10-09 (×3): qty 1
  Filled 2020-10-09: qty 4
  Filled 2020-10-09 (×2): qty 1

## 2020-10-09 MED ORDER — HYDRALAZINE HCL 10 MG PO TABS
10.0000 mg | ORAL_TABLET | Freq: Three times a day (TID) | ORAL | Status: DC
Start: 2020-10-09 — End: 2020-10-09
  Filled 2020-10-09: qty 1

## 2020-10-09 MED ORDER — POTASSIUM CHLORIDE CRYS ER 10 MEQ PO TBCR: 10.0000 meq | EXTENDED_RELEASE_TABLET | Freq: Three times a day (TID) | ORAL | Status: DC

## 2020-10-09 MED ORDER — ONDANSETRON HCL 4 MG/2ML IJ SOLN: 4.0000 mg | Freq: Four times a day (QID) | INTRAMUSCULAR | Status: DC | PRN

## 2020-10-09 MED ORDER — POLYETHYLENE GLYCOL 3350 17 G PO PACK: 17.0000 g | PACK | Freq: Every day | ORAL | Status: DC

## 2020-10-09 MED ORDER — AMLODIPINE BESYLATE 10 MG PO TABS
10.0000 mg | ORAL_TABLET | Freq: Every day | ORAL | Status: DC
  Administered 2020-10-09 – 2020-10-14 (×6): 10 mg via ORAL
  Filled 2020-10-09 (×2): qty 1
  Filled 2020-10-09: qty 2
  Filled 2020-10-09 (×3): qty 1

## 2020-10-09 MED ORDER — INSULIN ASPART 100 UNIT/ML ~~LOC~~ SOLN
0.0000 [IU] | Freq: Every day | SUBCUTANEOUS | Status: DC
  Administered 2020-10-09: 3 [IU] via SUBCUTANEOUS
  Administered 2020-10-10 – 2020-10-13 (×3): 2 [IU] via SUBCUTANEOUS

## 2020-10-09 MED ORDER — DICYCLOMINE HCL 10 MG PO CAPS
10.0000 mg | ORAL_CAPSULE | Freq: Three times a day (TID) | ORAL | Status: AC
  Administered 2020-10-09 – 2020-10-10 (×6): 10 mg via ORAL
  Filled 2020-10-09 (×6): qty 1

## 2020-10-09 NOTE — ED Notes (Signed)
McDonald, PA made aware of patient's INR level.

## 2020-10-09 NOTE — ED Notes (Signed)
Patient transported to X-ray 

## 2020-10-09 NOTE — ED Notes (Signed)
Lunch Tray Ordered @ 1006.  

## 2020-10-09 NOTE — ED Notes (Signed)
Just spoke with Rai, Ripudeep K, MD and informed her of critical INR 8.2 & K+ 2.3. This RN was informed to admn K+ replacement order and to let vit K finish & then recheck her values at 1600.

## 2020-10-09 NOTE — ED Notes (Addendum)
Lab reports critical INR 8.2, MDs Tyrone Nine and Rai notified.

## 2020-10-09 NOTE — Progress Notes (Signed)
New Admission Note:  Arrival Method: Stretcher Mental Orientation: Alert and oriented x 3-4 Telemetry: Box 22 Assessment: Completed Skin: Warm and dry.  IV: NSL  Pain: Denies  Tubes: Foley  Safety Measures: Safety Fall Prevention Plan initiated.  Admission: Completed 5 M  Orientation: Patient has been orientated to the room, unit and the staff. Welcome booklet given.  Family: N/A  Orders have been reviewed and implemented. Will continue to monitor the patient. Call light has been placed within reach and bed alarm has been activated.   Sima Matas BSN, RN  Phone Number: [phone]

## 2020-10-09 NOTE — ED Notes (Signed)
Critical INR 8.2 ... Attending paged and private messaged. Vitamin K was hung.
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
## 2020-10-09 NOTE — ED Notes (Addendum)
ED Provider at bedside. McDonald, PA aware of patient's BP readings.

## 2020-10-09 NOTE — ED Notes (Signed)
Dr Tana Coast paged and messaged, meds are running.

## 2020-10-09 NOTE — Progress Notes (Signed)
Report received. Room is ready.  
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
## 2020-10-10 ENCOUNTER — Other Ambulatory Visit: Payer: Self-pay | Admitting: Adult Health Nurse Practitioner

## 2020-10-10 DIAGNOSIS — R1084 Generalized abdominal pain: Secondary | ICD-10-CM | POA: Diagnosis not present

## 2020-10-10 DIAGNOSIS — K5900 Constipation, unspecified: Secondary | ICD-10-CM | POA: Diagnosis not present

## 2020-10-10 DIAGNOSIS — D649 Anemia, unspecified: Secondary | ICD-10-CM

## 2020-10-10 LAB — COMPREHENSIVE METABOLIC PANEL
ALT: 23 U/L (ref 0–44)
AST: 25 U/L (ref 15–41)
Albumin: 2.6 g/dL — ABNORMAL LOW (ref 3.5–5.0)
Alkaline Phosphatase: 41 U/L (ref 38–126)
Anion gap: 14 (ref 5–15)
BUN: 78 mg/dL — ABNORMAL HIGH (ref 8–23)
CO2: 23 mmol/L (ref 22–32)
Calcium: 9 mg/dL (ref 8.9–10.3)
Chloride: 98 mmol/L (ref 98–111)
Creatinine, Ser: 3.66 mg/dL — ABNORMAL HIGH (ref 0.44–1.00)
GFR, Estimated: 13 mL/min — ABNORMAL LOW (ref >60)
Glucose, Bld: 105 mg/dL — ABNORMAL HIGH (ref 70–99)
Potassium: 2.9 mmol/L — ABNORMAL LOW (ref 3.5–5.1)
Sodium: 135 mmol/L (ref 135–145)
Total Bilirubin: 0.9 mg/dL (ref 0.3–1.2)
Total Protein: 5.5 g/dL — ABNORMAL LOW (ref 6.5–8.1)

## 2020-10-10 LAB — FERRITIN: Ferritin: 180 ng/mL (ref 11–307)

## 2020-10-10 LAB — RETICULOCYTES
Immature Retic Fract: 6.4 % (ref 2.3–15.9)
RBC.: 3.88 MIL/uL (ref 3.87–5.11)
Retic Count, Absolute: 63.2 10*3/uL (ref 19.0–186.0)
Retic Ct Pct: 1.6 % (ref 0.4–3.1)

## 2020-10-10 LAB — IRON AND TIBC
Iron: 138 ug/dL (ref 28–170)
Saturation Ratios: 57 % — ABNORMAL HIGH (ref 10.4–31.8)
TIBC: 241 ug/dL — ABNORMAL LOW (ref 250–450)
UIBC: 103 ug/dL

## 2020-10-10 LAB — CBC
HCT: 34 % — ABNORMAL LOW (ref 36.0–46.0)
Hemoglobin: 11.7 g/dL — ABNORMAL LOW (ref 12.0–15.0)
MCH: 29.5 pg (ref 26.0–34.0)
MCHC: 34.4 g/dL (ref 30.0–36.0)
MCV: 85.6 fL (ref 80.0–100.0)
Platelets: 401 10*3/uL — ABNORMAL HIGH (ref 150–400)
RBC: 3.97 MIL/uL (ref 3.87–5.11)
RDW: 13.3 % (ref 11.5–15.5)
WBC: 12.8 10*3/uL — ABNORMAL HIGH (ref 4.0–10.5)
nRBC: 0 % (ref 0.0–0.2)

## 2020-10-10 LAB — GLUCOSE, CAPILLARY
Glucose-Capillary: 133 mg/dL — ABNORMAL HIGH (ref 70–99)
Glucose-Capillary: 244 mg/dL — ABNORMAL HIGH (ref 70–99)
Glucose-Capillary: 212 mg/dL — ABNORMAL HIGH (ref 70–99)
Glucose-Capillary: 245 mg/dL — ABNORMAL HIGH (ref 70–99)

## 2020-10-10 LAB — PROTIME-INR
INR: 1.8 — ABNORMAL HIGH (ref 0.8–1.2)
Prothrombin Time: 19.9 seconds — ABNORMAL HIGH (ref 11.4–15.2)

## 2020-10-10 LAB — HEMOGLOBIN A1C
Hgb A1c MFr Bld: 8.6 % — ABNORMAL HIGH (ref 4.8–5.6)
Mean Plasma Glucose: 200.12 mg/dL

## 2020-10-10 LAB — URINE CULTURE: Culture: NO GROWTH

## 2020-10-10 LAB — FOLATE: Folate: 11.1 ng/mL (ref >5.9)

## 2020-10-10 LAB — VITAMIN B12: Vitamin B-12: 1461 pg/mL — ABNORMAL HIGH (ref 180–914)

## 2020-10-10 MED ORDER — PANTOPRAZOLE SODIUM 40 MG PO TBEC
40.0000 mg | DELAYED_RELEASE_TABLET | Freq: Every day | ORAL | Status: DC
  Administered 2020-10-11 – 2020-10-14 (×4): 40 mg via ORAL
  Filled 2020-10-10 (×4): qty 1

## 2020-10-10 MED ORDER — WARFARIN - PHARMACIST DOSING INPATIENT: Freq: Every day | Status: DC

## 2020-10-10 MED ORDER — LACTATED RINGERS IV SOLN: INTRAVENOUS | Status: DC

## 2020-10-10 MED ORDER — INSULIN GLARGINE 100 UNIT/ML ~~LOC~~ SOLN
15.0000 [IU] | Freq: Every day | SUBCUTANEOUS | Status: DC
  Administered 2020-10-10 – 2020-10-11 (×2): 15 [IU] via SUBCUTANEOUS
  Filled 2020-10-10 (×4): qty 0.15

## 2020-10-10 MED ORDER — POTASSIUM CHLORIDE CRYS ER 20 MEQ PO TBCR
60.0000 meq | EXTENDED_RELEASE_TABLET | Freq: Once | ORAL | Status: AC
  Administered 2020-10-10: 60 meq via ORAL
  Filled 2020-10-10: qty 3

## 2020-10-10 MED ORDER — WARFARIN SODIUM 2.5 MG PO TABS
2.5000 mg | ORAL_TABLET | Freq: Once | ORAL | Status: AC
  Administered 2020-10-10: 2.5 mg via ORAL
  Filled 2020-10-10: qty 1

## 2020-10-10 MED ORDER — POLYETHYLENE GLYCOL 3350 17 G PO PACK
17.0000 g | PACK | Freq: Every day | ORAL | Status: DC
  Filled 2020-10-10: qty 1

## 2020-10-10 NOTE — Progress Notes (Signed)
PROGRESS NOTE  Ashley [name]  DOB: Sep 23, 1945  PCP: Unk Pinto, MD JIR:[id number]  DOA: 10/08/2020  LOS: 0 days   Chief Complaint  Patient presents with  . Constipation   Brief narrative: Ashley [name] is a 75 y.o. female with PMH significant for T2DM, HTN, DVT on Coumadin, CKD stage IV, lung cancer in remission, vulvar cancer in remission, COPD, GERD, hiatal hernia, chronic iron deficiency, anxiety/bipolar disorder. Patient presented to the ED on 10/08/2020 with complaint of diffuse generalized abdominal pain for 3 to 4 weeks, with progressive generalized weakness, weight loss, constipation and dehydration.  She was seen at GI office as an outpatient on 08/27/2020.  She underwent CT abdomen pelvis on 09/19/2020 which did not show any acute findings to explain her GI symptoms.  In the ED, patient had blood pressure 204/89 Labs with potassium low at 2.1, BUN/creatinine elevated to 9 9/1.5, INR elevated to 6.9.  Patient was admitted to hospitalist service. GI consultation was obtained.  Subjective: Patient was seen and examined this morning.  Elderly Caucasian female.  Sitting up bed.  Not in distress.  Assessment/Plan: Acute on chronic generalized abdominal pain with nausea and vomiting Chronic constipation -Unclear etiology, CT abdomen on 2/2 was unrevealing.  Lipase normal.  GI consultation obtained.  Patient evidently has had an extensive evaluation as an outpatient.  GI consultation was obtained.  Per GI, low yield of planning EGD.  It is possible that chronic constipation could have led her to have chronic abdominal pain.  Constipation regimen adjusted. -Currently on Bentyl 10 mg 3 times daily scheduled, Senokot scheduled, MiraLAX scheduled.  Acute kidney injury superimposed on CKD stage IV, not on dialysis -Renal function likely worsened due to dehydration, poor p.o. intake in the last month.  Baseline creatinine 3.0-3.1.  Presented to ED with a creatinine of 4.5. -Improvement  with IV fluid.  Continue gentle hydration. -Continue to hold torsemide, chlorthalidone Recent Labs    11/22/19 1603 12/01/19 1430 03/06/20 1707 04/19/20 1239 06/19/20 1507 10/08/20 2030 10/09/20 0924 10/10/20 0438  BUN 47* 43* 49* 56* 46* 99* 103* 78*  CREATININE 3.05* 3.09* 3.17* 3.33* 3.12* 4.59* 4.36* 3.66*   Severe hypokalemia -Potassium level remains low at 2.6 this morning.  Currently on a scheduled as well as intermittent replacement of oral potassium -Repeat labs tomorrow. Recent Labs  Lab 10/08/20 2030 10/09/20 0426 10/09/20 0924 10/09/20 1635 10/10/20 0438  K 2.1*  --  2.3* 2.6* 2.9*  MG  --  2.8*  --   --   --    Hyponatremia -Improving on IV fluid. Recent Labs  Lab 10/08/20 2030 10/09/20 0924 10/10/20 0438  NA 130* 132* 135   Hypertensive urgency -Systolic blood pressure over 200 on presentation.   -Home meds include torsemide 20 mg daily, chlorthalidone 25 mg daily, amlodipine 10 mg daily.   -Currently on amlodipine. Chlorthalidone and torsemide on hold.  Blood pressure elevated to 160s.  Continue hydralazine IV as needed for today.   Uncontrolled type 2 diabetes mellitus  -A1c 8.6 on 10/10/2020 -At home, patient uses Novolin 25 to 50 units twice daily. -Currently on sliding scale insulin only.  Blood sugar elevated to over 200 consistently.  I will start her on Lantus 15 units from tonight. Recent Labs  Lab 10/09/20 2206 10/10/20 0746 10/10/20 1151 10/10/20 1624  GLUCAP 284* 133* 244* 212*   Acute urinary retention -Foley catheter placed in ED.  Voiding trial tomorrow.  History of DVT, on anticoagulation -INR 6.9 on admission.  Coumadin remains on hold.  INR 1.8 today.  Will reinitiate Coumadin. -Continue to monitor INR Recent Labs  Lab 10/09/20 0426 10/09/20 0924 10/09/20 1635 10/10/20 0438  INR 6.9* 8.2* 2.6* 1.8*   Chronic iron deficiency -On chronic iron repletion.  May be a challenge because of constipation.  Ferritin level is  appropriate at 180 today 2/23.  We will stop further IV or oral iron replacement at this time. Recent Labs    04/19/20 1239 06/19/20 1507 10/08/20 2030 10/09/20 0924 10/10/20 0438 10/10/20 0447  HGB 10.5* 11.6* 13.6 12.7 11.7*  --   MCV 91.4 90.1 84.5 84.9 85.6  --   VITAMINB12  --   --   --   --  1,461*  --   FOLATE  --   --   --   --   --  11.1  FERRITIN  --   --   --   --  180  --   TIBC  --   --   --   --  241*  --   IRON  --   --   --   --  138  --   RETICCTPCT  --   --   --   --   --  1.6   COPD -Currently no wheezing, patient has stopped smoking last week.  Declined nicotine patch  Hypothyroidism -Continue Synthroid  GERD (gastroesophageal reflux disease) -Continue PPI  Mobility: Encourage ambulation Code Status:   Code Status: Full Code  Nutritional status: There is no height or weight on file to calculate BMI.     Diet Order            Diet Carb Modified Fluid consistency: Thin; Room service appropriate? Yes  Diet effective now                 DVT prophylaxis: SCDs Start: 10/09/20 1140   Antimicrobials:  None Fluid: LR at 100 Consultants: GI Family Communication:  None at bedside  Status is: Observation  Dispo: The patient is from: Home              Anticipated d/c is to: Home              Anticipated d/c date is: 2 days              Patient currently is not medically stable to d/c.   Difficult to place patient No       Infusions:  . lactated ringers      Scheduled Meds: . amLODipine  10 mg Oral Daily  . atorvastatin  40 mg Oral Daily  . Chlorhexidine Gluconate Cloth  6 each Topical Daily  . dicyclomine  10 mg Oral TID AC & HS  . fenofibrate  54 mg Oral Daily  . insulin aspart  0-5 Units Subcutaneous QHS  . insulin aspart  0-9 Units Subcutaneous TID WC  . insulin glargine  15 Units Subcutaneous QHS  . levothyroxine  88 mcg Oral QAC breakfast  . [START ON 10/11/2020] pantoprazole  40 mg Oral Daily  . polyethylene glycol  17 g Oral  Daily  . potassium chloride  10 mEq Oral TID  . senna-docusate  1 tablet Oral BID    Antimicrobials: Anti-infectives (From admission, onward)   None      PRN meds: ondansetron (ZOFRAN) IV   Objective: Vitals:   10/10/20 0950 10/10/20 1625  BP: (!) 154/73 (!) 161/74  Pulse: 71 79  Resp: 18 18  Temp: 97.8 F (36.6 C) 98.2 F (36.8 C)  SpO2: 95% 96%    Intake/Output Summary (Last 24 hours) at 10/10/2020 1737 Last data filed at 10/10/2020 1031 Gross per 24 hour  Intake 1386.68 ml  Output 2050 ml  Net -663.32 ml   There were no vitals filed for this visit. Weight change:  There is no height or weight on file to calculate BMI.   Physical Exam: General exam: Pleasant, elderly female.  Not in distress. Skin: No rashes, lesions or ulcers. HEENT: Atraumatic, normocephalic, no obvious bleeding Lungs: Clear to auscultation bilaterally CVS: Regular rate and rhythm, no murmur GI/Abd soft, nontender, nondistended, bowel sounds present CNS: Alert, awake, oriented x3 Psychiatry: Mood appropriate Extremities: No pedal edema, no calf redness  Data Review: I have personally reviewed the laboratory data and studies available.  Recent Labs  Lab 10/08/20 2030 10/09/20 0924 10/10/20 0438  WBC 12.7* 13.1* 12.8*  HGB 13.6 12.7 11.7*  HCT 38.2 37.2 34.0*  MCV 84.5 84.9 85.6  PLT 588* 427* 401*   Recent Labs  Lab 10/08/20 2030 10/09/20 0426 10/09/20 0924 10/09/20 1635 10/10/20 0438  NA 130*  --  132*  --  135  K 2.1*  --  2.3* 2.6* 2.9*  CL 85*  --  95*  --  98  CO2 25  --  21*  --  23  GLUCOSE 170*  --  94  --  105*  BUN 99*  --  103*  --  78*  CREATININE 4.59*  --  4.36*  --  3.66*  CALCIUM 9.3  --  8.8*  --  9.0  MG  --  2.8*  --   --   --     F/u labs ordered Unresulted Labs (From admission, onward)          Start     Ordered   10/11/20 0500  CBC with Differential/Platelet  Daily,   R     Question:  Specimen collection method  Answer:  Lab=Lab collect    10/10/20 0826   10/11/20 0500  Comprehensive metabolic panel  Daily,   R     Question:  Specimen collection method  Answer:  Lab=Lab collect   10/10/20 0826   10/10/20 0500  Protime-INR  Daily,   R      10/09/20 2215          Signed, Terrilee Croak, MD [facility] 10/10/2020

## 2020-10-10 NOTE — Progress Notes (Signed)
ANTICOAGULATION CONSULT NOTE - Initial Consult  Pharmacy Consult for  Warfarin  Indication: history of DVT  Allergies  Allergen Reactions  . Sulfa Antibiotics Other (See Comments)    Pt states she had "extreme pain"  . Sulfacetamide Sodium Other (See Comments)    Pain      Vital Signs: Temp: 98.2 F (36.8 C) (02/23 1625) Temp Source: Oral (02/23 0950) BP: 161/74 (02/23 1625) Pulse Rate: 79 (02/23 1625)  Labs: Recent Labs    10/08/20 2030 10/09/20 0426 10/09/20 0924 10/09/20 1635 10/10/20 0438  HGB 13.6  --  12.7  --  11.7*  HCT 38.2  --  37.2  --  34.0*  PLT 588*  --  427*  --  401*  LABPROT  --    < > 66.1* 27.2* 19.9*  INR  --    < > 8.2* 2.6* 1.8*  CREATININE 4.59*  --  4.36*  --  3.66*   < > = values in this interval not displayed.    CrCl cannot be calculated (Unknown ideal weight.).   Medical History: Past Medical History:  Diagnosis Date  . Anxiety disorder   . Arthritis LOWER BACK  . Asthma   . Bipolar 1 disorder ([place])   . Carpal tunnel syndrome   . Chronic kidney disease    stage 3  . Depression   . Diabetes mellitus    type 2  . Dyslipidemia   . Dyspnea    with much activity  . Gait disorder   . GERD (gastroesophageal reflux disease)   . Gout LEFT FOOT-  STABLE  . H/O hiatal hernia   . History of CVA (cerebrovascular accident) FOUND PER MRI 1994--  RESIDUAL MEMORY IMPAIRED  . Hypercholesteremia   . Hypertension   . Memory difficulty 08/14/2016  . Neuropathy of lower extremity   . OCD (obsessive compulsive disorder)   . Peripheral neuropathy   . SOB (shortness of breath) on exertion   . Stroke ([place])    2000 memory loss   . SUI (stress urinary incontinence, female)   . Vulva cancer ([place]) 07/07/2012  . Vulvar lesion     Medications:  Medications Prior to Admission  Medication Sig Dispense Refill Last Dose  . acetaminophen (TYLENOL) 500 MG tablet Take 1,000 mg by mouth every 6 (six) hours as needed for mild pain or headache.   Past  Week at Unknown time  . amLODipine (NORVASC) 10 MG tablet Take 1 tablet (10 mg total) by mouth daily. 30 tablet 0 10/07/2020  . aspirin EC 81 MG tablet Take 81 mg by mouth daily.   10/07/2020 at am  . atorvastatin (LIPITOR) 40 MG tablet TAKE 1 TABLET BY MOUTH EVERY DAY FOR CHOLESTEROL (Patient taking differently: Take 40 mg by mouth daily.) 90 tablet 0 10/07/2020  . azelastine (ASTELIN) 0.1 % nasal spray PLACE 2 SPRAYS INTO BOTH NOSTRILS 2 (TWO) TIMES DAILY. USE IN EACH NOSTRIL AS DIRECTED (Patient taking differently: Place 2 sprays into both nostrils 2 (two) times daily as needed for allergies.) 90 mL 3 unk at prn  . chlorthalidone (HYGROTON) 25 MG tablet Take 25 mg by mouth daily.   10/07/2020  . Cholecalciferol (VITAMIN D3) 25 MCG (1000 UT) CAPS Take by mouth. Takes 2 to 3 capsules daily.   10/07/2020  . clorazepate (TRANXENE-T) 7.5 MG tablet Take    1/2 to 1 tablet    1 or 2 x /day    ONLY if needed for  Nerves (Patient taking  differently: Take 7.5 mg by mouth 2 (two) times daily as needed for anxiety or sleep.) 30 tablet 0 Past Week at Unknown time  . famotidine (PEPCID) 20 MG tablet TAKE 1 TABLET TWICE A DAY WITH MEALS FOR INDIGESTION. (Patient taking differently: Take 20 mg by mouth 2 (two) times daily.) 180 tablet 3 10/07/2020 at Unknown time  . fenofibrate (TRICOR) 145 MG tablet TAKE 1 TABLET DAILY FOR TRIGLYCERIDES (BLOOD FATS) (Patient taking differently: Take 145 mg by mouth daily.) 90 tablet 1 10/07/2020  . FLUoxetine (PROZAC) 20 MG capsule Take 20 mg by mouth daily.   10/07/2020  . levothyroxine (SYNTHROID) 88 MCG tablet Take   1 tablet    Daily   on an empty stomach with only water for 30 minutes & no Antacid meds, Calcium or Magnesium for 4 hours & avoid Biotin (Patient taking differently: Take 88 mcg by mouth daily before breakfast.) 90 tablet 0 10/07/2020  . Multiple Vitamins-Minerals (MULTIVITAMIN WITH MINERALS) tablet Take 1 tablet by mouth daily.   10/07/2020  . NOVOLIN N 100 UNIT/ML  injection INJECT 50 UNITS EVERY MORNING AND 25 UNITS EVERY EVENING (Patient taking differently: Inject 25-50 Units into the skin See admin instructions. Injects 25-50 units into the skin after checking CBG daily (CBG 160-299 mg/dL: 25 units; CBG >300 mg/dl: 50 units); gives extra 25 units in the evening if taking a big meal) 20 mL 7 10/07/2020  . polyethylene glycol (MIRALAX / GLYCOLAX) 17 g packet Take 17 g by mouth daily. (Patient taking differently: Take 17 g by mouth daily as needed for mild constipation.) 30 each 3 unk at prn  . potassium chloride (KLOR-CON M10) 10 MEQ tablet TAKE 1 TABLET 3 X /DAY WITH MEALS FOR POTASSIUM (Patient taking differently: Take 10 mEq by mouth 2 (two) times daily.) 270 tablet 1 10/07/2020  . torsemide (DEMADEX) 20 MG tablet Take  1 tablet  Daily  for Fluid Retention & Legs Swelling (Patient taking differently: Take 20 mg by mouth daily.) 90 tablet 0 10/07/2020  . vitamin C (ASCORBIC ACID) 500 MG tablet Take 500 mg by mouth daily. Take with iron/ferrous sulfate   10/07/2020  . warfarin (COUMADIN) 5 MG tablet TAKE 1 TABLET BY MOUTH EVERY DAY (Patient taking differently: Take 2.5-5 mg by mouth See admin instructions. Takes 1 tablet (5 mg totally) by mouth every day; Except 0.5 tablet (2.5 mg totally) on Monday) 90 tablet 1 10/07/2020 at 1800  . ONETOUCH ULTRA test strip TEST BLOOD SUGAR 3 TIMES A DAY 300 strip 1 n/a  . prochlorperazine (COMPAZINE) 10 MG tablet TAKE 1 TABLET EVERY 4 TO 6 HOURS (MAXIMUM 3 TABLETS /DAY) IF NEEDED FOR NAUSEA OR VOMITING (Patient not taking: No sig reported) 30 tablet 0 Not Taking at Unknown time    Assessment: 75 y.o female on warfarin prior to admission for h/o DVT.  INR was supratherpeutic at 6.9 on admit.  Warfarin held and  Vitamin K 1 mg IVPB given to reverse INR.   Given significant clinical improvement, and no plan for endoscopic evaluation, pharmacist has been consulted on 10/10/20 to restart Warfarin.   INR is 1.8 today,  Hgb 11.7, pltc  400s PTA warfarin dose was 5mg  daily except 2.5 mg qMon.  No bleeding reported.    Goal of Therapy:  INR 2-3 Monitor platelets by anticoagulation protocol: Yes   Plan:  Give Warfarin 2.5 mg tonight x1  Daily INR  Thank you for allowing pharmacy to be part of this patients care team.  Nicole Cella, RPh Clinical Pharmacist 10/10/2020,6:00 PM

## 2020-10-10 NOTE — Progress Notes (Addendum)
Attending physician's note   I have taken an interval history, reviewed the chart and examined the patient. I agree with the Advanced Practitioner's note, impression, and recommendations as outlined.   Large BM last night with significant improvement in abdominal pain.  States she feels much better today.  -Continue Bentyl as scheduled and can eventually change to prn -Explained to her that we want to continue MiraLAX daily, and can titrate up/down to maintain soft stools without straining to have BM -Given significant clinical improvement, no plan for endoscopic evaluation or additional cross-sectional imaging -Agree that psychiatry/psychology follow-up would be beneficial -Agree with treating any iron deficiency with IV formulation to avoid GI ADR from oral iron -GI service will sign off at this time.  Please do not hesitate to contact us with additional questions or concerns.  Ashley Hall, DO, FACG [phone] office                                                                Daily Rounding Note  10/10/2020, 9:19 AM  LOS: 0 days   SUBJECTIVE:   Chief complaint:    Abdominal pain, constipation.  Had a large bowel movement last night and her abdominal pain feels better.  Describes the stool as black but she been taking twice daily iron. Overall feeling better.  OBJECTIVE:         Vital signs in last 24 hours:    Temp:  [97.4 F (36.3 C)-98.5 F (36.9 C)] 97.4 F (36.3 C) (02/23 0455) Pulse Rate:  [75-97] 75 (02/23 0455) Resp:  [12-22] 20 (02/23 0455) BP: (137-210)/(71-94) 188/74 (02/23 0455) SpO2:  [95 %-100 %] 95 % (02/23 0455) Last BM Date: 10/10/20 There were no vitals filed for this visit. General: Comfortable, less anxious, pale.  Does not look acutely ill. Heart: RRR. Chest: Diminished but clear. Abdomen: Soft, nontender, nondistended, active bowel sounds. Extremities: No CCE. Neuro/Psych: Calm, fluid speech.  No tremors or gross  weakness.  Intake/Output from previous day: 02/22 0701 - 02/23 0700 In: 1026.7 [P.O.:420; I.V.:606.7] Out: 1750 [Urine:1750]  Intake/Output this shift: Total I/O In: 360 [P.O.:360] Out: -   Lab Results: Recent Labs    10/08/20 2030 10/09/20 0924 10/10/20 0438  WBC 12.7* 13.1* 12.8*  HGB 13.6 12.7 11.7*  HCT 38.2 37.2 34.0*  PLT 588* 427* 401*   BMET Recent Labs    10/08/20 2030 10/09/20 0924 10/09/20 1635 10/10/20 0438  NA 130* 132*  --  135  K 2.1* 2.3* 2.6* 2.9*  CL 85* 95*  --  98  CO2 25 21*  --  23  GLUCOSE 170* 94  --  105*  BUN 99* 103*  --  78*  CREATININE 4.59* 4.36*  --  3.66*  CALCIUM 9.3 8.8*  --  9.0   LFT Recent Labs    10/08/20 2030 10/10/20 0438  PROT 6.9 5.5*  ALBUMIN 3.3* 2.6*  AST 32 25  ALT 35 23  ALKPHOS 51 41  BILITOT 0.5 0.9   PT/INR Recent Labs    10/09/20 1635 10/10/20 0438  LABPROT 27.2* 19.9*  INR 2.6* 1.8*   Hepatitis Panel No results for input(s): HEPBSAG, HCVAB, HEPAIGM, HEPBIGM in the last 72 hours.  Studies/Results: DG Abd 2 Views  Result Date: 10/09/2020  CLINICAL DATA:  Abdominal pain for several days. Nausea and vomiting. EXAM: ABDOMEN - 2 VIEW COMPARISON:  Acute abdominal series scratched at CT of the abdomen pelvis 09/19/2020 FINDINGS: The lobe airspace opacity with fiducial markers again noted. Lung bases are otherwise clear. Bowel gas pattern is normal. Vascular calcifications are evident. IMPRESSION: Acute or abnormality to explain patient's abdominal pain. Electronically Signed   By: San Morelle M.D.   On: 10/09/2020 11:03    ASSESMENT:   *   Chronic generalized abdominal pain. Chronic constipation.   Weight loss. Lymphocytic colitis on colonoscopy 09/2017  *    AKI superimposed on CKD.  *   Chronic Coumadin for hx DVT.  Coagulopathy.  INR 8.2 >> 1.8.     *    Mild, normocytic anemia.  Hb 11.7 consistent with previous baseline 2021. Iron, ferritin, iron saturations are above normal.  Low  TIBC.  Folate, B12 at or above normal.  *    DM 2.  Hemoglobin A1c 8.6 consistent with average blood glucose of 200.  *   Hypoglycemia.  *   Depression, anxiety, bipolar disorder.   *    Lung and vulvar cancer in remission.   PLAN   *    Would not restart oral iron.  If she becomes iron deficient in the future would address this with Feraheme infusions or, possibly every other day iron, but twice a day iron certainly contributing to her constipation and abdominal distress.  *    No plans for endoscopic/colonoscopic work-up so okay to restart anticoagulation.  *   Carb modified diet.  *   Continue scheduled Bentyl for the next 24 hours and then switch to prn.  Continue Protonix 40 mg po /daily.    Daily Miralax, hold if diarrhea occurs.    *   Should probably have a visit with her psychiatrist to address current medication regimen.  Ideally this should be in person.    Azucena Freed  10/10/2020, 9:19 AM Phone [phone]

## 2020-10-10 NOTE — Evaluation (Signed)
Physical Therapy Evaluation Patient Details Name: Ashley [name] MRN: [id number] DOB: 1946/03/17 Today's Date: 10/10/2020   History of Present Illness  Pt is a 75 y/o female who presented to ED with constipation, nausea, vomiting, and abdominal pain that began 4 weeks ago. Pt diagnosed with generalized abdominal pain. PMH significant for GERD, hx of hiatal hernia, anxiety, bipolar disorder, CKD, DMT2, HTN, COPD, and remission for lung and vulvar cancer.    Clinical Impression  Pt received in bed, cooperative and pleasant. Min guard for transfers and ambulation ~20 ft, needing 1 min sitting rest break after ~10 ft citing increased pain in B knees. Pt reporting multiple falls at home with cane and fearful of falling again, but was steady with B UE support. No physical assist given. Minimal cueing for correct hand placement during transfers and on RW during ambulation. Pt left in chair with alarm active, all needs met, call bell within reach, and RN aware of status. [name] unable to provide 24/7 support due to unpredictable work schedule. Pt open to PCA for support in home when husband away. Recommend HHPT to assist with OOB mobility and wheelchair for independence when alone in home.     Follow Up Recommendations Home health PT;Supervision for mobility/OOB;Other (comment) (PCA if financial situation allows; will have to consider SNF if slow to progress)    Equipment Recommendations  3in1 (PT);Rolling walker with 5" wheels;Wheelchair cushion (measurements PT);Wheelchair (measurements PT)    Recommendations for Other Services       Precautions / Restrictions        Mobility  Bed Mobility Overal bed mobility: Needs Assistance Bed Mobility: Supine to Sit     Supine to sit: Supervision     General bed mobility comments: No physical assist given    Transfers Overall transfer level: Needs assistance Equipment used: Rolling walker (2 wheeled) Transfers: Sit to/from [employer]  Transfers Sit to Stand: Min guard Stand pivot transfers: Min guard       General transfer comment: Min guard for safety, no physical assist given, cueing to push from chair  Ambulation/Gait Ambulation/Gait assistance: Min guard Gait Distance (Feet): 20 Feet (walked 10 ft, then sitting break for 1 min, walked another 10 ft) Assistive device: Rolling walker (2 wheeled) Gait Pattern/deviations: Step-to pattern;Decreased step length - left;Decreased stance time - right;Narrow base of support;Trunk flexed Gait velocity: Decreased   General Gait Details: min guard for safety, cueing for navigation of RW in small space  Stairs            Wheelchair Mobility    Modified Rankin (Stroke Patients Only)       Balance Overall balance assessment: Needs assistance Sitting-balance support: Feet supported Sitting balance-Leahy Scale: Fair     Standing balance support: Bilateral upper extremity supported Standing balance-Leahy Scale: Poor Standing balance comment: Reliant on B UE in standing and for mobility                             Pertinent Vitals/Pain Pain Assessment: Faces Faces Pain Scale: Hurts even more Pain Location: Stomach, B knees Pain Descriptors / Indicators: Discomfort;Aching Pain Intervention(s): Monitored during session;Repositioned    Home Living Family/patient expects to be discharged to:: Private residence Living Arrangements: Spouse/significant other Available Help at Discharge: Available PRN/intermittently;Family Type of Home: House Home Access: Stairs to enter Entrance Stairs-Rails: None Entrance Stairs-Number of Steps: 2 Home Layout: One level Home Equipment: Cane - single point;Walker - 2  wheels;Other (comment) (rollator)      Prior Function Level of Independence: Independent with assistive device(s)         Comments: Brother is Ashley [name]. Husband at home but works during day and schedule is unpredictable. Reports bathing with  washcloth sitting on commode because tub is too high. Uses cane around the house     Hand Dominance        Extremity/Trunk Assessment   Upper Extremity Assessment Upper Extremity Assessment: Overall WFL for tasks assessed    Lower Extremity Assessment Lower Extremity Assessment: Generalized weakness    Cervical / Trunk Assessment Cervical / Trunk Assessment: Kyphotic  Communication   Communication: No difficulties  Cognition Arousal/Alertness: Awake/alert Behavior During Therapy: WFL for tasks assessed/performed Overall Cognitive Status: Within Functional Limits for tasks assessed                                 General Comments: A&Ox4. Cooperative, but fearful of falling. Follows commands consistently with increased time. Responds appropriately to questions with tangential answers      General Comments General comments (skin integrity, edema, etc.): VSS on RA    Exercises     Assessment/Plan    PT Assessment Patient needs continued PT services  PT Problem List Decreased strength;Decreased cognition;Decreased knowledge of use of DME;Decreased activity tolerance;Decreased balance;Decreased mobility;Decreased coordination;Pain       PT Treatment Interventions DME instruction;Balance training;Gait training;Neuromuscular re-education;Stair training;Functional mobility training;Therapeutic activities;Wheelchair mobility training;Patient/family education;Therapeutic exercise    PT Goals (Current goals can be found in the Care Plan section)  Acute Rehab PT Goals Patient Stated Goal: be able to walk PT Goal Formulation: With patient Time For Goal Achievement: 10/24/20 Potential to Achieve Goals: Good    Frequency Min 3X/week   Barriers to discharge        Co-evaluation               AM-PAC PT "6 Clicks" Mobility  Outcome Measure Help needed turning from your back to your side while in a flat bed without using bedrails?: None Help needed moving  from lying on your back to sitting on the side of a flat bed without using bedrails?: None Help needed moving to and from a bed to a chair (including a wheelchair)?: A Little Help needed standing up from a chair using your arms (e.g., wheelchair or bedside chair)?: None Help needed to walk in hospital room?: A Little Help needed climbing 3-5 steps with a railing? : A Lot 6 Click Score: 20    End of Session Equipment Utilized During Treatment: Gait belt Activity Tolerance: Patient tolerated treatment well Patient left: in chair;with chair alarm set   PT Visit Diagnosis: Unsteadiness on feet (R26.81);Muscle weakness (generalized) (M62.81);Pain    Time:  -      Charges:             Rosita Kea, SPT
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
## 2020-10-10 NOTE — Care Management Obs Status (Signed)
[facility] NOTIFICATION   Patient Details  Name: Ashley [name] MRN: [id number] Date of Birth: [birth year]   Medicare Observation Status Notification Given:  Yes    Bartholomew Crews, RN 10/10/2020, 5:28 PM

## 2020-10-11 ENCOUNTER — Inpatient Hospital Stay (HOSPITAL_COMMUNITY): Payer: HMO

## 2020-10-11 ENCOUNTER — Ambulatory Visit: Payer: HMO | Admitting: Adult Health

## 2020-10-11 DIAGNOSIS — E876 Hypokalemia: Secondary | ICD-10-CM | POA: Diagnosis present

## 2020-10-11 DIAGNOSIS — Z79899 Other long term (current) drug therapy: Secondary | ICD-10-CM | POA: Diagnosis not present

## 2020-10-11 DIAGNOSIS — F3130 Bipolar disorder, current episode depressed, mild or moderate severity, unspecified: Secondary | ICD-10-CM | POA: Diagnosis present

## 2020-10-11 DIAGNOSIS — E860 Dehydration: Secondary | ICD-10-CM | POA: Diagnosis present

## 2020-10-11 DIAGNOSIS — E1122 Type 2 diabetes mellitus with diabetic chronic kidney disease: Secondary | ICD-10-CM | POA: Diagnosis present

## 2020-10-11 DIAGNOSIS — Z8673 Personal history of transient ischemic attack (TIA), and cerebral infarction without residual deficits: Secondary | ICD-10-CM | POA: Diagnosis not present

## 2020-10-11 DIAGNOSIS — E1151 Type 2 diabetes mellitus with diabetic peripheral angiopathy without gangrene: Secondary | ICD-10-CM | POA: Diagnosis present

## 2020-10-11 DIAGNOSIS — R339 Retention of urine, unspecified: Secondary | ICD-10-CM | POA: Diagnosis present

## 2020-10-11 DIAGNOSIS — I129 Hypertensive chronic kidney disease with stage 1 through stage 4 chronic kidney disease, or unspecified chronic kidney disease: Secondary | ICD-10-CM | POA: Diagnosis present

## 2020-10-11 DIAGNOSIS — Z86718 Personal history of other venous thrombosis and embolism: Secondary | ICD-10-CM | POA: Diagnosis not present

## 2020-10-11 DIAGNOSIS — Z20822 Contact with and (suspected) exposure to covid-19: Secondary | ICD-10-CM | POA: Diagnosis present

## 2020-10-11 DIAGNOSIS — E1142 Type 2 diabetes mellitus with diabetic polyneuropathy: Secondary | ICD-10-CM | POA: Diagnosis present

## 2020-10-11 DIAGNOSIS — K219 Gastro-esophageal reflux disease without esophagitis: Secondary | ICD-10-CM | POA: Diagnosis present

## 2020-10-11 DIAGNOSIS — M109 Gout, unspecified: Secondary | ICD-10-CM | POA: Diagnosis present

## 2020-10-11 DIAGNOSIS — N179 Acute kidney failure, unspecified: Secondary | ICD-10-CM | POA: Diagnosis present

## 2020-10-11 DIAGNOSIS — N184 Chronic kidney disease, stage 4 (severe): Secondary | ICD-10-CM | POA: Diagnosis present

## 2020-10-11 DIAGNOSIS — Z85118 Personal history of other malignant neoplasm of bronchus and lung: Secondary | ICD-10-CM | POA: Diagnosis not present

## 2020-10-11 DIAGNOSIS — R1084 Generalized abdominal pain: Secondary | ICD-10-CM | POA: Diagnosis not present

## 2020-10-11 DIAGNOSIS — Z8544 Personal history of malignant neoplasm of other female genital organs: Secondary | ICD-10-CM | POA: Diagnosis not present

## 2020-10-11 DIAGNOSIS — E785 Hyperlipidemia, unspecified: Secondary | ICD-10-CM | POA: Diagnosis present

## 2020-10-11 DIAGNOSIS — Z794 Long term (current) use of insulin: Secondary | ICD-10-CM | POA: Diagnosis not present

## 2020-10-11 DIAGNOSIS — E871 Hypo-osmolality and hyponatremia: Secondary | ICD-10-CM | POA: Diagnosis present

## 2020-10-11 DIAGNOSIS — F17210 Nicotine dependence, cigarettes, uncomplicated: Secondary | ICD-10-CM | POA: Diagnosis present

## 2020-10-11 DIAGNOSIS — E872 Acidosis: Secondary | ICD-10-CM | POA: Diagnosis present

## 2020-10-11 DIAGNOSIS — Z7901 Long term (current) use of anticoagulants: Secondary | ICD-10-CM | POA: Diagnosis not present

## 2020-10-11 DIAGNOSIS — I5031 Acute diastolic (congestive) heart failure: Secondary | ICD-10-CM | POA: Diagnosis not present

## 2020-10-11 DIAGNOSIS — Z7982 Long term (current) use of aspirin: Secondary | ICD-10-CM | POA: Diagnosis not present

## 2020-10-11 LAB — CBC WITH DIFFERENTIAL/PLATELET
Abs Immature Granulocytes: 0.08 10*3/uL — ABNORMAL HIGH (ref 0.00–0.07)
Basophils Absolute: 0 10*3/uL (ref 0.0–0.1)
Basophils Relative: 0 %
Eosinophils Absolute: 0.1 10*3/uL (ref 0.0–0.5)
Eosinophils Relative: 1 %
HCT: 30 % — ABNORMAL LOW (ref 36.0–46.0)
Hemoglobin: 10.7 g/dL — ABNORMAL LOW (ref 12.0–15.0)
Immature Granulocytes: 1 %
Lymphocytes Relative: 12 %
Lymphs Abs: 1.2 10*3/uL (ref 0.7–4.0)
MCH: 30.7 pg (ref 26.0–34.0)
MCHC: 35.7 g/dL (ref 30.0–36.0)
MCV: 86 fL (ref 80.0–100.0)
Monocytes Absolute: 0.8 10*3/uL (ref 0.1–1.0)
Monocytes Relative: 9 %
Neutro Abs: 7.8 10*3/uL — ABNORMAL HIGH (ref 1.7–7.7)
Neutrophils Relative %: 77 %
Platelets: 318 10*3/uL (ref 150–400)
RBC: 3.49 MIL/uL — ABNORMAL LOW (ref 3.87–5.11)
RDW: 13.5 % (ref 11.5–15.5)
WBC: 9.9 10*3/uL (ref 4.0–10.5)
nRBC: 0 % (ref 0.0–0.2)

## 2020-10-11 LAB — COMPREHENSIVE METABOLIC PANEL
ALT: 20 U/L (ref 0–44)
AST: 22 U/L (ref 15–41)
Albumin: 2.3 g/dL — ABNORMAL LOW (ref 3.5–5.0)
Alkaline Phosphatase: 37 U/L — ABNORMAL LOW (ref 38–126)
Anion gap: 10 (ref 5–15)
BUN: 67 mg/dL — ABNORMAL HIGH (ref 8–23)
CO2: 23 mmol/L (ref 22–32)
Calcium: 8.5 mg/dL — ABNORMAL LOW (ref 8.9–10.3)
Chloride: 101 mmol/L (ref 98–111)
Creatinine, Ser: 4.12 mg/dL — ABNORMAL HIGH (ref 0.44–1.00)
GFR, Estimated: 11 mL/min — ABNORMAL LOW (ref >60)
Glucose, Bld: 108 mg/dL — ABNORMAL HIGH (ref 70–99)
Potassium: 3.3 mmol/L — ABNORMAL LOW (ref 3.5–5.1)
Sodium: 134 mmol/L — ABNORMAL LOW (ref 135–145)
Total Bilirubin: 0.7 mg/dL (ref 0.3–1.2)
Total Protein: 5 g/dL — ABNORMAL LOW (ref 6.5–8.1)

## 2020-10-11 LAB — GLUCOSE, CAPILLARY
Glucose-Capillary: 107 mg/dL — ABNORMAL HIGH (ref 70–99)
Glucose-Capillary: 136 mg/dL — ABNORMAL HIGH (ref 70–99)
Glucose-Capillary: 233 mg/dL — ABNORMAL HIGH (ref 70–99)
Glucose-Capillary: 157 mg/dL — ABNORMAL HIGH (ref 70–99)

## 2020-10-11 LAB — PROTIME-INR
INR: 1.5 — ABNORMAL HIGH (ref 0.8–1.2)
Prothrombin Time: 17.8 seconds — ABNORMAL HIGH (ref 11.4–15.2)

## 2020-10-11 MED ORDER — WARFARIN SODIUM 5 MG PO TABS
5.0000 mg | ORAL_TABLET | Freq: Once | ORAL | Status: AC
  Administered 2020-10-11: 5 mg via ORAL
  Filled 2020-10-11: qty 1

## 2020-10-11 MED ORDER — POLYETHYLENE GLYCOL 3350 17 G PO PACK: 17.0000 g | PACK | Freq: Every day | ORAL | Status: DC | PRN

## 2020-10-11 MED ORDER — SODIUM CHLORIDE 0.9 % IV SOLN: INTRAVENOUS | Status: DC

## 2020-10-11 NOTE — Progress Notes (Signed)
ANTICOAGULATION CONSULT NOTE - Follow Up Consult  Pharmacy Consult for Warfarin Indication: RLE DVT 11/2019   Allergies  Allergen Reactions  . Sulfa Antibiotics Other (See Comments)    Pt states she had "extreme pain"  . Sulfacetamide Sodium Other (See Comments)    Pain    Patient Measurements:  Height: 63.5 inches  Weight: 66.1 kg (08/18/2020)   Vital Signs: Temp: 98.2 F (36.8 C) (02/24 0900) Temp Source: Oral (02/24 0900) BP: 155/76 (02/24 0900) Pulse Rate: 80 (02/24 0900)  Labs: Recent Labs    10/09/20 0924 10/09/20 1635 10/10/20 0438 10/11/20 0317  HGB 12.7  --  11.7* 10.7*  HCT 37.2  --  34.0* 30.0*  PLT 427*  --  401* 318  LABPROT 66.1* 27.2* 19.9* 17.8*  INR 8.2* 2.6* 1.8* 1.5*  CREATININE 4.36*  --  3.66* 4.12*   Assessment:  75 yr old female on warfarin prior to admission for hx RLE DVT 11/2019.  INR supratherapeutic on admit (6.9 > 8.2). No bleeding reported. Warfarin held and Vitamin K 1 mg IV given.      INR down to 1.8 on 2/23; no GI procedures planned and Warfarin resumed with 2.5 mg.  INR down to 1.5 today.    PTA warfarin regimen: 5 mg daily except 2.5 mg on Mondays.    - last outpatient INR 2.3 on 09/24/20    - nausea/vomiting and decreased PO intake PTA likely contributed to supratherapeutic INR on admit  Goal of Therapy:  INR 2-3 Monitor platelets by anticoagulation protocol: Yes   Plan:   Warfarin 5 mg x 1 today.  Daily PT/INR.  Arty Baumgartner, RPh 10/11/2020,10:50 AM

## 2020-10-11 NOTE — Care Management (Cosign Needed)
    Durable Medical Equipment  (From admission, onward)         Start     Ordered   10/11/20 1546  For home use only DME 3 n 1  Once        10/11/20 1545   10/11/20 1545  For home use only DME standard manual wheelchair with seat cushion  Once       Comments: Patient suffers from Acute on chronic generalized abdominal painwith nausea and vomiting Chronic constipation which impairs their ability to perform daily activities like ambulating  in the home.  A cane will not resolve issue with performing activities of daily living. A wheelchair will allow patient to safely perform daily activities. Patient can safely propel the wheelchair in the home or has a caregiver who can provide assistance. Length of need lifetime. Accessories: elevating leg rests (ELRs), wheel locks, extensions and anti-tippers.  Seat and back cushions   10/11/20 1545

## 2020-10-11 NOTE — Plan of Care (Signed)
  Problem: Health Behavior/Discharge Planning: Goal: Ability to manage health-related needs will improve Outcome: Progressing   Problem: Clinical Measurements: Goal: Ability to maintain clinical measurements within normal limits will improve Outcome: Progressing   Problem: Activity: Goal: Risk for activity intolerance will decrease Outcome: Progressing   Problem: Coping: Goal: Level of anxiety will decrease Outcome: Progressing   Problem: Elimination: Goal: Will not experience complications related to bowel motility Outcome: Progressing   Problem: Safety: Goal: Ability to remain free from injury will improve Outcome: Progressing

## 2020-10-11 NOTE — Progress Notes (Signed)
PROGRESS NOTE  Ashley [name]  DOB: 1945-11-19  PCP: Unk Pinto, MD UEA:[id number]  DOA: 10/08/2020  LOS: 0 days   Chief Complaint  Patient presents with  . Constipation   Brief narrative: Ashley [name] is a 75 y.o. female with PMH significant for T2DM, HTN, DVT on Coumadin, CKD stage IV, lung cancer in remission, vulvar cancer in remission, COPD, GERD, hiatal hernia, chronic iron deficiency, anxiety/bipolar disorder. Patient presented to the ED on 10/08/2020 with complaint of diffuse generalized abdominal pain for 3 to 4 weeks, with progressive generalized weakness, weight loss, constipation and dehydration.  She was seen at GI office as an outpatient on 08/27/2020.  She underwent CT abdomen pelvis on 09/19/2020 which did not show any acute findings to explain her GI symptoms.  In the ED, patient had blood pressure 204/89 Labs with potassium low at 2.1, BUN/creatinine elevated to 9 9/1.5, INR elevated to 6.9.  Patient was admitted to hospitalist service. GI consultation was obtained.  Subjective: Patient was seen and examined this morning.   Sitting up in bed.  Not in distress.  Feels better after bowel movement yesterday.   Blood pressure in 170s.  Labs this morning with potassium at 3.3, creatinine up to 4.12.    Assessment/Plan: Acute on chronic generalized abdominal pain with nausea and vomiting Chronic constipation -Unclear etiology, CT abdomen on 2/2 was unrevealing.  Lipase normal.  GI consultation obtained.  Patient evidently has had an extensive evaluation as an outpatient.  GI consultation was obtained.  Per GI, low yield of planning EGD.  It is possible that chronic constipation could have led her to have chronic abdominal pain.  Constipation regimen adjusted.  Patient feels better after bowel movement yesterday. -Currently on Bentyl 10 mg 3 times daily scheduled, Senokot scheduled, MiraLAX scheduled.  She is complaining of leaking stool. Switch MiraLAX to as needed  Acute  kidney injury superimposed on CKD stage IV, not on dialysis -Renal function likely worsened due to dehydration, poor p.o. intake in the last month.  Baseline creatinine 3.0-3.1.  Presented to ED with a creatinine of 4.5. -Initially improved with IV fluid, but worse again today.  Continue hydration.  Repeat creatinine tomorrow.  If no improvement, will get nephrology consultation.  Cc Dr. [name] as an outpatient. -Continue to hold torsemide, chlorthalidone Recent Labs    11/22/19 1603 12/01/19 1430 03/06/20 1707 04/19/20 1239 06/19/20 1507 10/08/20 2030 10/09/20 0924 10/10/20 0438 10/11/20 0317  BUN 47* 43* 49* 56* 46* 99* 103* 78* 67*  CREATININE 3.05* 3.09* 3.17* 3.33* 3.12* 4.59* 4.36* 3.66* 4.12*   Severe hypokalemia -Potassium level remains improving but still remains low, 3.2 this morning. Currently on a scheduled as well as intermittent replacement of oral potassium -Repeat labs tomorrow. Recent Labs  Lab 10/08/20 2030 10/09/20 0426 10/09/20 0924 10/09/20 1635 10/10/20 0438 10/11/20 0317  K 2.1*  --  2.3* 2.6* 2.9* 3.3*  MG  --  2.8*  --   --   --   --    Hyponatremia -Improving on IV fluid. Recent Labs  Lab 10/08/20 2030 10/09/20 0924 10/10/20 0438 10/11/20 0317  NA 130* 132* 135 134*   Hypertensive urgency -Systolic blood pressure over 200 on presentation.   -Home meds include torsemide 20 mg daily, chlorthalidone 25 mg daily, amlodipine 10 mg daily.   -Currently on amlodipine.  Continue to hold chlorthalidone and torsemide on hold.  Blood pressure elevated to 160s.  Continue hydralazine IV as needed for today.   Uncontrolled  type 2 diabetes mellitus  -A1c 8.6 on 10/10/2020 -At home, patient uses Novolin 25 to 50 units twice daily. -Currently on Lantus 15 units nightly and sliding scale insulin with Accu-Cheks.  Continue the same.   Recent Labs  Lab 10/10/20 1151 10/10/20 1624 10/10/20 2113 10/11/20 0637 10/11/20 1113  GLUCAP 244* 212* 245* 107* 136*    Acute urinary retention -Foley catheter placed in ED, more than 1.5 L emptied.  Voiding trial tomorrow.  History of DVT, on anticoagulation -INR 6.9 on admission.  Improved to less than two.  Coumadin resumed. -Continue to monitor INR Recent Labs  Lab 10/09/20 0426 10/09/20 0924 10/09/20 1635 10/10/20 0438 10/11/20 0317  INR 6.9* 8.2* 2.6* 1.8* 1.5*   Chronic iron deficiency -On chronic iron repletion.  May be a challenge because of constipation.  Ferritin level is appropriate at 180 today 2/23.  We will stop further IV or oral iron replacement at this time. Recent Labs    06/19/20 1507 10/08/20 2030 10/09/20 0924 10/10/20 0438 10/10/20 0447 10/11/20 0317  HGB 11.6* 13.6 12.7 11.7*  --  10.7*  MCV 90.1 84.5 84.9 85.6  --  86.0  VITAMINB12  --   --   --  1,461*  --   --   FOLATE  --   --   --   --  11.1  --   FERRITIN  --   --   --  180  --   --   TIBC  --   --   --  241*  --   --   IRON  --   --   --  138  --   --   RETICCTPCT  --   --   --   --  1.6  --    COPD -Currently no wheezing, patient has stopped smoking last week.  Declined nicotine patch  Hypothyroidism -Continue Synthroid  GERD (gastroesophageal reflux disease) -Continue PPI  Mobility: Encourage ambulation Code Status:   Code Status: Full Code  Nutritional status: There is no height or weight on file to calculate BMI.     Diet Order            Diet Carb Modified Fluid consistency: Thin; Room service appropriate? Yes  Diet effective now                 DVT prophylaxis: SCDs Start: 10/09/20 1140   Antimicrobials:  None Fluid: NS at 100/h Consultants: GI Family Communication:  None at bedside  Status is: Observation  Dispo: The patient is from: Home              Anticipated d/c is to: Home              Anticipated d/c date is: 2 days              Patient currently is not medically stable to d/c.   Difficult to place patient No       Infusions:  . sodium chloride 100  mL/hr at 10/11/20 0957    Scheduled Meds: . amLODipine  10 mg Oral Daily  . atorvastatin  40 mg Oral Daily  . Chlorhexidine Gluconate Cloth  6 each Topical Daily  . fenofibrate  54 mg Oral Daily  . insulin aspart  0-5 Units Subcutaneous QHS  . insulin aspart  0-9 Units Subcutaneous TID WC  . insulin glargine  15 Units Subcutaneous QHS  . levothyroxine  88 mcg Oral QAC breakfast  .  pantoprazole  40 mg Oral Daily  . potassium chloride  10 mEq Oral TID  . senna-docusate  1 tablet Oral BID  . warfarin  5 mg Oral ONCE-1600  . Warfarin - Pharmacist Dosing Inpatient   Does not apply q1600    Antimicrobials: Anti-infectives (From admission, onward)   None      PRN meds: ondansetron (ZOFRAN) IV, polyethylene glycol   Objective: Vitals:   10/11/20 0445 10/11/20 0900  BP: (!) 177/79 (!) 155/76  Pulse: 81 80  Resp: 20 18  Temp: 98.3 F (36.8 C) 98.2 F (36.8 C)  SpO2: 98% 98%    Intake/Output Summary (Last 24 hours) at 10/11/2020 1559 Last data filed at 10/11/2020 1300 Gross per 24 hour  Intake 1964.68 ml  Output 2000 ml  Net -35.32 ml   There were no vitals filed for this visit. Weight change:  There is no height or weight on file to calculate BMI.   Physical Exam: General exam: Pleasant, elderly female.  Not in distress. Skin: No rashes, lesions or ulcers. HEENT: Atraumatic, normocephalic, no obvious bleeding Lungs: Clear to auscultate bilaterally CVS: Regular rate and rhythm, no murmur GI/Abd soft, nontender, nondistended, bowel sounds present CNS: Alert, awake, oriented x3 Psychiatry: Mood appropriate Extremities: No pedal edema, no calf redness  Data Review: I have personally reviewed the laboratory data and studies available.  Recent Labs  Lab 10/08/20 2030 10/09/20 0924 10/10/20 0438 10/11/20 0317  WBC 12.7* 13.1* 12.8* 9.9  NEUTROABS  --   --   --  7.8*  HGB 13.6 12.7 11.7* 10.7*  HCT 38.2 37.2 34.0* 30.0*  MCV 84.5 84.9 85.6 86.0  PLT 588* 427*  401* 318   Recent Labs  Lab 10/08/20 2030 10/09/20 0426 10/09/20 0924 10/09/20 1635 10/10/20 0438 10/11/20 0317  NA 130*  --  132*  --  135 134*  K 2.1*  --  2.3* 2.6* 2.9* 3.3*  CL 85*  --  95*  --  98 101  CO2 25  --  21*  --  23 23  GLUCOSE 170*  --  94  --  105* 108*  BUN 99*  --  103*  --  78* 67*  CREATININE 4.59*  --  4.36*  --  3.66* 4.12*  CALCIUM 9.3  --  8.8*  --  9.0 8.5*  MG  --  2.8*  --   --   --   --     F/u labs ordered Unresulted Labs (From admission, onward)          Start     Ordered   10/11/20 0500  CBC with Differential/Platelet  Daily,   R     Question:  Specimen collection method  Answer:  Lab=Lab collect   10/10/20 0826   10/11/20 0500  Comprehensive metabolic panel  Daily,   R     Question:  Specimen collection method  Answer:  Lab=Lab collect   10/10/20 0826   10/10/20 0500  Protime-INR  Daily,   R      10/09/20 2215          Signed, Terrilee Croak, MD [facility] 10/11/2020

## 2020-10-11 NOTE — Discharge Instructions (Signed)

## 2020-10-11 NOTE — Plan of Care (Signed)
  Problem: Education: Goal: Knowledge of General Education information will improve Description: Including pain rating scale, medication(s)/side effects and non-pharmacologic comfort measures Outcome: Completed/Met   Problem: Clinical Measurements: Goal: Diagnostic test results will improve Outcome: Completed/Met Goal: Respiratory complications will improve Outcome: Completed/Met Goal: Cardiovascular complication will be avoided Outcome: Completed/Met   Problem: Nutrition: Goal: Adequate nutrition will be maintained Outcome: Completed/Met   Problem: Pain Managment: Goal: General experience of comfort will improve Outcome: Completed/Met   Problem: Skin Integrity: Goal: Risk for impaired skin integrity will decrease Outcome: Completed/Met

## 2020-10-11 NOTE — TOC Progression Note (Addendum)
Transition of Care [facility]) - Progression Note    Patient Details  Name: RIKITA GRABERT MRN: [id number] Date of Birth: [birth year]  [facility] CM/SW Contact  Jacalyn Lefevre Edson Snowball, RN Phone Number: 10/11/2020, 1:29 PM  Clinical Narrative:     Called patient on hospital phone to discuss PT recommendations. She requested NCM to call husband [name] Taul [phone] called no answer and voicemail box has not been set up.   Called patient's brother Fritz Pickerel and left a message.  Fritz Pickerel returned call. NCM discussed PT recommendations for HHPT supervision and 3 in1 and wheelchair. Fritz Pickerel confirmed patient has a walker but she does not use it.   He will talk to patient and [name] this evening regarding above and what they want to do. He would like to know cost of HHPT and 3in1, and wheel chair prior to talking to patient and [name] .   NCM called [employer] , as long as home health agency is in network there is no co pay. NCM was transferred to multiple departments regarding DME cost with no answers. NCM called Freda Munro with [place]. Freda Munro will need orders for DME to submit to insurance to verify coverage. Orders entered , Freda Munro will call NCM when information received.   NCM will call Fritz Pickerel back when have information   Called Fritz Pickerel back . HHPT no co pay Wheel chair and 3 in 1  $96.00 for the first month and then $10.04/month for the next 12 months. They would need to set up auto pay . Fritz Pickerel voiced understanding and will talk to Beverlee Nims and [name] this evening  Expected Discharge Plan: Home/Self Care Barriers to Discharge: Continued Medical Work up  Expected Discharge Plan and Services Expected Discharge Plan: Home/Self Care In-house Referral: NA Discharge Planning Services: CM Consult                     DME Arranged: N/A DME Agency: NA       HH Arranged: NA HH Agency: NA         Social Determinants of Health (SDOH) Interventions    Readmission Risk  Interventions No flowsheet data found.

## 2020-10-12 ENCOUNTER — Inpatient Hospital Stay (HOSPITAL_COMMUNITY): Payer: HMO

## 2020-10-12 DIAGNOSIS — I5031 Acute diastolic (congestive) heart failure: Secondary | ICD-10-CM

## 2020-10-12 LAB — CBC WITH DIFFERENTIAL/PLATELET
Abs Immature Granulocytes: 0.06 10*3/uL (ref 0.00–0.07)
Basophils Absolute: 0 10*3/uL (ref 0.0–0.1)
Basophils Relative: 0 %
Eosinophils Absolute: 0.1 10*3/uL (ref 0.0–0.5)
Eosinophils Relative: 1 %
HCT: 30.6 % — ABNORMAL LOW (ref 36.0–46.0)
Hemoglobin: 10.1 g/dL — ABNORMAL LOW (ref 12.0–15.0)
Immature Granulocytes: 1 %
Lymphocytes Relative: 17 %
Lymphs Abs: 1.3 10*3/uL (ref 0.7–4.0)
MCH: 29.3 pg (ref 26.0–34.0)
MCHC: 33 g/dL (ref 30.0–36.0)
MCV: 88.7 fL (ref 80.0–100.0)
Monocytes Absolute: 0.6 10*3/uL (ref 0.1–1.0)
Monocytes Relative: 8 %
Neutro Abs: 5.7 10*3/uL (ref 1.7–7.7)
Neutrophils Relative %: 73 %
Platelets: 294 10*3/uL (ref 150–400)
RBC: 3.45 MIL/uL — ABNORMAL LOW (ref 3.87–5.11)
RDW: 13.1 % (ref 11.5–15.5)
WBC: 7.8 10*3/uL (ref 4.0–10.5)
nRBC: 0 % (ref 0.0–0.2)

## 2020-10-12 LAB — COMPREHENSIVE METABOLIC PANEL
ALT: 20 U/L (ref 0–44)
AST: 17 U/L (ref 15–41)
Albumin: 2.1 g/dL — ABNORMAL LOW (ref 3.5–5.0)
Alkaline Phosphatase: 33 U/L — ABNORMAL LOW (ref 38–126)
Anion gap: 12 (ref 5–15)
BUN: 63 mg/dL — ABNORMAL HIGH (ref 8–23)
CO2: 19 mmol/L — ABNORMAL LOW (ref 22–32)
Calcium: 8.3 mg/dL — ABNORMAL LOW (ref 8.9–10.3)
Chloride: 102 mmol/L (ref 98–111)
Creatinine, Ser: 3.67 mg/dL — ABNORMAL HIGH (ref 0.44–1.00)
GFR, Estimated: 12 mL/min — ABNORMAL LOW (ref >60)
Glucose, Bld: 210 mg/dL — ABNORMAL HIGH (ref 70–99)
Potassium: 3.2 mmol/L — ABNORMAL LOW (ref 3.5–5.1)
Sodium: 133 mmol/L — ABNORMAL LOW (ref 135–145)
Total Bilirubin: 0.7 mg/dL (ref 0.3–1.2)
Total Protein: 4.6 g/dL — ABNORMAL LOW (ref 6.5–8.1)

## 2020-10-12 LAB — GLUCOSE, CAPILLARY
Glucose-Capillary: 138 mg/dL — ABNORMAL HIGH (ref 70–99)
Glucose-Capillary: 218 mg/dL — ABNORMAL HIGH (ref 70–99)
Glucose-Capillary: 189 mg/dL — ABNORMAL HIGH (ref 70–99)
Glucose-Capillary: 238 mg/dL — ABNORMAL HIGH (ref 70–99)

## 2020-10-12 LAB — PROTIME-INR
INR: 1.7 — ABNORMAL HIGH (ref 0.8–1.2)
Prothrombin Time: 19 seconds — ABNORMAL HIGH (ref 11.4–15.2)

## 2020-10-12 LAB — ECHOCARDIOGRAM COMPLETE
Area-P 1/2: 3.03 cm2
Height: 63.5 in
S' Lateral: 2.4 cm
Weight: 2296.31 oz

## 2020-10-12 MED ORDER — HYDRALAZINE HCL 25 MG PO TABS
25.0000 mg | ORAL_TABLET | Freq: Three times a day (TID) | ORAL | Status: DC
  Administered 2020-10-12 – 2020-10-13 (×4): 25 mg via ORAL
  Filled 2020-10-12 (×4): qty 1

## 2020-10-12 MED ORDER — INSULIN GLARGINE 100 UNIT/ML ~~LOC~~ SOLN
20.0000 [IU] | Freq: Every day | SUBCUTANEOUS | Status: DC
  Administered 2020-10-12: 20 [IU] via SUBCUTANEOUS
  Filled 2020-10-12 (×2): qty 0.2

## 2020-10-12 MED ORDER — POTASSIUM CHLORIDE CRYS ER 20 MEQ PO TBCR
20.0000 meq | EXTENDED_RELEASE_TABLET | Freq: Two times a day (BID) | ORAL | Status: DC
  Administered 2020-10-12 (×2): 20 meq via ORAL
  Filled 2020-10-12 (×2): qty 1

## 2020-10-12 MED ORDER — WARFARIN SODIUM 5 MG PO TABS
5.0000 mg | ORAL_TABLET | Freq: Once | ORAL | Status: AC
  Administered 2020-10-12: 5 mg via ORAL
  Filled 2020-10-12: qty 1

## 2020-10-12 NOTE — Progress Notes (Signed)
  Echocardiogram 2D Echocardiogram has been performed.  Ashley [name] 10/12/2020, 2:58 PM

## 2020-10-12 NOTE — Progress Notes (Signed)
Physical Therapy Treatment Patient Details Name: Ashley [name] MRN: [id number] DOB: 1946-05-11 Today's Date: 10/12/2020    History of Present Illness Pt is a 75 y.o. female admitted 10/08/20 with nausea, vomiting, abdominal pain; etiology unclear. Pt with chronic constipation, AKI on CKD IV. PMH includes anxiety, bipolar disorder, CKD, DM2, HTN, COPD, lung and vulvar cancer.   PT Comments    Pt progressing well with mobility. Today's session focused on transfer and gait training with RW, pt able to increase ambulation distance and perform ADL tasks in room. Remains limited by generalized weakness and deceased activity tolerance. Discussed DME needs with pt who is in agreement she does not require w/c, which would not fit in her hallways at home. Continue to recommend HHPT services to maximize functional mobility and independence - pt reports "I don't want someone to have to come home with me, but I guess if they have to." Will continue to follow acutely.   Follow Up Recommendations  Home health PT;Supervision - Intermittent     Equipment Recommendations  3in1 (PT)    Recommendations for Other Services       Precautions / Restrictions Precautions Precautions: Fall Restrictions Weight Bearing Restrictions: No    Mobility  Bed Mobility Overal bed mobility: Modified Independent Bed Mobility: Supine to Sit           General bed mobility comments: HOB slightly elevated    Transfers Overall transfer level: Needs assistance Equipment used: Rolling walker (2 wheeled) Transfers: Sit to/from Stand Sit to Stand: Supervision;Min assist         General transfer comment: Able to stand from EOB and recliner to RW with supervision; minA to stand from low toilet height without UE support  Ambulation/Gait Ambulation/Gait assistance: Supervision Gait Distance (Feet): 80 Feet Assistive device: Rolling walker (2 wheeled) Gait Pattern/deviations: Step-through pattern;Decreased stride  length;Trunk flexed Gait velocity: Decreased   General Gait Details: Slow, steady gait with RW and supervision for safety; pt declined hallway ambulation, walking into bathroom to void and walking multiple laps in room before requiring seated rest break due to BLE fatigue   Stairs             Wheelchair Mobility    Modified Rankin (Stroke Patients Only)       Balance Overall balance assessment: Needs assistance Sitting-balance support: Feet supported Sitting balance-Leahy Scale: Good     Standing balance support: Bilateral upper extremity supported Standing balance-Leahy Scale: Fair Standing balance comment: Can static stand without UE support; dynamic stability improved with UE support on RW                            Cognition Arousal/Alertness: Awake/alert Behavior During Therapy: WFL for tasks assessed/performed Overall Cognitive Status: Within Functional Limits for tasks assessed                                        Exercises      General Comments        Pertinent Vitals/Pain Pain Assessment: Faces Faces Pain Scale: Hurts a little bit Pain Location: Abdomen, pubic area Pain Descriptors / Indicators: Discomfort Pain Intervention(s): Monitored during session    Home Living                      Prior Function  PT Goals (current goals can now be found in the care plan section) Progress towards PT goals: Progressing toward goals    Frequency    Min 3X/week      PT Plan Current plan remains appropriate    Co-evaluation              AM-PAC PT "6 Clicks" Mobility   Outcome Measure  Help needed turning from your back to your side while in a flat bed without using bedrails?: None Help needed moving from lying on your back to sitting on the side of a flat bed without using bedrails?: None Help needed moving to and from a bed to a chair (including a wheelchair)?: A Little Help needed  standing up from a chair using your arms (e.g., wheelchair or bedside chair)?: A Little Help needed to walk in hospital room?: A Little Help needed climbing 3-5 steps with a railing? : A Little 6 Click Score: 20    End of Session Equipment Utilized During Treatment: Gait belt Activity Tolerance: Patient tolerated treatment well Patient left: in chair;with chair alarm set Nurse Communication: Mobility status PT Visit Diagnosis: Unsteadiness on feet (R26.81);Muscle weakness (generalized) (M62.81);Pain     Time: 9643-8381 PT Time Calculation (min) (ACUTE ONLY): 24 min  Charges:  $Gait Training: 8-22 mins $Therapeutic Activity: 8-22 mins                    Mabeline Caras, PT, DPT Acute Rehabilitation Services  Pager [phone] Office [place] 10/12/2020, 10:16 AM

## 2020-10-12 NOTE — Progress Notes (Signed)
PROGRESS NOTE  Ashley [name]  DOB: Apr 18, 1946  PCP: Unk Pinto, MD SNK:[id number]  DOA: 10/08/2020  LOS: 1 day   Chief Complaint  Patient presents with  . Constipation   Brief narrative: Ashley [name] is a 75 y.o. female with PMH significant for T2DM, HTN, DVT on Coumadin, CKD stage IV, lung cancer in remission, vulvar cancer in remission, COPD, GERD, hiatal hernia, chronic iron deficiency, anxiety/bipolar disorder. Patient presented to the ED on 10/08/2020 with complaint of diffuse generalized abdominal pain for 3 to 4 weeks, with progressive generalized weakness, weight loss, constipation and dehydration.  She was seen at GI office as an outpatient on 08/27/2020.  She underwent CT abdomen pelvis on 09/19/2020 which did not show any acute findings to explain her GI symptoms.  In the ED, patient had blood pressure 204/89 Labs with potassium low at 2.1, BUN/creatinine elevated to 9 9/1.5, INR elevated to 6.9.  Patient was admitted to hospitalist service. GI consultation was obtained.  Subjective: Patient was seen and examined this morning.   Propped up in bed.  Not in distress. Blood pressure elevated to 180s.  Lab with creatinine improvement to 3.67 today with potassium low at 3.2.  Assessment/Plan: Acute on chronic generalized abdominal pain with nausea and vomiting Chronic constipation -Unclear etiology, CT abdomen on 2/2 was unrevealing.  Lipase normal.  GI consultation obtained.  Patient evidently has had an extensive evaluation as an outpatient.  GI consultation was obtained.  Per GI, low yield of planning EGD.  It is possible that chronic constipation could have led her to have chronic abdominal pain.  Constipation regimen adjusted.  Patient feels better after bowel movement yesterday. -Currently on Bentyl 10 mg 3 times daily scheduled, Senokot scheduled, MiraLAX scheduled.  She is complaining of leaking stool. Switch MiraLAX to as needed  Acute kidney injury superimposed on  CKD stage IV, not on dialysis -Baseline creatinine 3-3.1.  Presented with a creatinine of 4.5. Renal function likely worsened due to dehydration, poor p.o. intake in the last month.   -With IV hydration, creatinine is gradually improving, 3.67 today.  Renal ultrasound did not show any abnormality.  Patient follows up with nephrologist Dr. [name] as an outpatient. -Continue to hold torsemide, chlorthalidone Recent Labs    11/22/19 1603 12/01/19 1430 03/06/20 1707 04/19/20 1239 06/19/20 1507 10/08/20 2030 10/09/20 0924 10/10/20 0438 10/11/20 0317 10/12/20 0301  BUN 47* 43* 49* 56* 46* 99* 103* 78* 67* 63*  CREATININE 3.05* 3.09* 3.17* 3.33* 3.12* 4.59* 4.36* 3.66* 4.12* 3.67*   Severe hypokalemia -Potassium level remains improving but still remains low, 3.2 this morning. Currently on a scheduled as well as intermittent replacement of oral potassium -Repeat labs tomorrow. Recent Labs  Lab 10/09/20 0426 10/09/20 0924 10/09/20 1635 10/10/20 0438 10/11/20 0317 10/12/20 0301  K  --  2.3* 2.6* 2.9* 3.3* 3.2*  MG 2.8*  --   --   --   --   --    Hyponatremia -Improving on IV fluid. Recent Labs  Lab 10/08/20 2030 10/09/20 0924 10/10/20 0438 10/11/20 0317 10/12/20 0301  NA 130* 132* 135 134* 133*   Hypertensive urgency -Systolic blood pressure over 200 on presentation.   -Home meds include torsemide 20 mg daily, chlorthalidone 25 mg daily, amlodipine 10 mg daily.   -Currently on amlodipine 10 mg daily.  Chlorthalidone and torsemide are on hold.  Blood pressure is elevated to 180s.  I added hydralazine 25 mg 3 times daily this morning. -Also continue hydralazine IV  PRN. -Obtain echocardiogram.  Uncontrolled type 2 diabetes mellitus  -A1c 8.6 on 10/10/2020 -At home, patient uses Novolin 25 to 50 units twice daily. -Currently on Lantus 15 units nightly and sliding scale insulin with Accu-Cheks.  Fasting blood sugar elevated 238 this morning.  I will increase Lantus to 20 units  for tonight. Recent Labs  Lab 10/11/20 1113 10/11/20 1617 10/11/20 2030 10/12/20 0652 10/12/20 1114  GLUCAP 136* 233* 157* 138* 218*   Acute urinary retention -Foley catheter placed in ED, more than 1.5 L emptied.  Remove Foley catheter today for voiding trial.  History of DVT, on anticoagulation -INR 6.9 on admission.  Improved. Coumadin resumed. -Continue to monitor INR Recent Labs  Lab 10/09/20 0924 10/09/20 1635 10/10/20 0438 10/11/20 0317 10/12/20 0301  INR 8.2* 2.6* 1.8* 1.5* 1.7*   Chronic iron deficiency -On chronic iron repletion.  May be a challenge because of constipation.  Ferritin level is appropriate at 180 on 2/23.  We will stop further IV or oral iron replacement at this time. Recent Labs    10/08/20 2030 10/09/20 0924 10/10/20 0438 10/10/20 0447 10/11/20 0317 10/12/20 0301  HGB 13.6 12.7 11.7*  --  10.7* 10.1*  MCV 84.5 84.9 85.6  --  86.0 88.7  VITAMINB12  --   --  1,461*  --   --   --   FOLATE  --   --   --  11.1  --   --   FERRITIN  --   --  180  --   --   --   TIBC  --   --  241*  --   --   --   IRON  --   --  138  --   --   --   RETICCTPCT  --   --   --  1.6  --   --    COPD -Currently no wheezing, patient has stopped smoking last week.  Declined nicotine patch  Hypothyroidism -Continue Synthroid  GERD (gastroesophageal reflux disease) -Continue PPI  Mobility: Encourage ambulation Code Status:   Code Status: Full Code  Nutritional status: Body mass index is 25.02 kg/m.     Diet Order            Diet Carb Modified Fluid consistency: Thin; Room service appropriate? Yes  Diet effective now                 DVT prophylaxis: SCDs Start: 10/09/20 1140   Antimicrobials:  None Fluid: Continue normal saline at 100/h Consultants: GI Family Communication:  None at bedside  Status is: Inpatient  Dispo: The patient is from: Home              Anticipated d/c is to: Home              Anticipated d/c date is: 2 days               Patient currently is not medically stable to d/c.   Difficult to place patient No       Infusions:  . sodium chloride 100 mL/hr at 10/11/20 2059    Scheduled Meds: . amLODipine  10 mg Oral Daily  . atorvastatin  40 mg Oral Daily  . Chlorhexidine Gluconate Cloth  6 each Topical Daily  . fenofibrate  54 mg Oral Daily  . hydrALAZINE  25 mg Oral Q8H  . insulin aspart  0-5 Units Subcutaneous QHS  . insulin aspart  0-9 Units Subcutaneous TID  WC  . insulin glargine  20 Units Subcutaneous QHS  . levothyroxine  88 mcg Oral QAC breakfast  . pantoprazole  40 mg Oral Daily  . potassium chloride  20 mEq Oral BID  . senna-docusate  1 tablet Oral BID  . warfarin  5 mg Oral ONCE-1600  . Warfarin - Pharmacist Dosing Inpatient   Does not apply q1600    Antimicrobials: Anti-infectives (From admission, onward)   None      PRN meds: ondansetron (ZOFRAN) IV, polyethylene glycol   Objective: Vitals:   10/12/20 1014 10/12/20 1131  BP: (!) 204/78 (!) 163/76  Pulse: 76 79  Resp: 14 16  Temp: 97.7 F (36.5 C)   SpO2: 100% 98%    Intake/Output Summary (Last 24 hours) at 10/12/2020 1429 Last data filed at 10/12/2020 1300 Gross per 24 hour  Intake 2457.45 ml  Output 2700 ml  Net -242.55 ml   Filed Weights   10/11/20 2028  Weight: 65.1 kg   Weight change:  Body mass index is 25.02 kg/m.   Physical Exam: General exam: Pleasant, elderly female.  Not in distress. Skin: No rashes, lesions or ulcers. HEENT: Atraumatic, normocephalic, no obvious bleeding Lungs: Clear to auscultation bilaterally CVS: Regular rate and rhythm, no murmur GI/Abd soft, nontender, nondistended, bowel sounds present CNS: Alert, awake, oriented x3 Psychiatry: Depressed look Extremities: No pedal edema, no calf redness  Data Review: I have personally reviewed the laboratory data and studies available.  Recent Labs  Lab 10/08/20 2030 10/09/20 0924 10/10/20 0438 10/11/20 0317 10/12/20 0301  WBC  12.7* 13.1* 12.8* 9.9 7.8  NEUTROABS  --   --   --  7.8* 5.7  HGB 13.6 12.7 11.7* 10.7* 10.1*  HCT 38.2 37.2 34.0* 30.0* 30.6*  MCV 84.5 84.9 85.6 86.0 88.7  PLT 588* 427* 401* 318 294   Recent Labs  Lab 10/08/20 2030 10/09/20 0426 10/09/20 0924 10/09/20 1635 10/10/20 0438 10/11/20 0317 10/12/20 0301  NA 130*  --  132*  --  135 134* 133*  K 2.1*  --  2.3* 2.6* 2.9* 3.3* 3.2*  CL 85*  --  95*  --  98 101 102  CO2 25  --  21*  --  23 23 19*  GLUCOSE 170*  --  94  --  105* 108* 210*  BUN 99*  --  103*  --  78* 67* 63*  CREATININE 4.59*  --  4.36*  --  3.66* 4.12* 3.67*  CALCIUM 9.3  --  8.8*  --  9.0 8.5* 8.3*  MG  --  2.8*  --   --   --   --   --     F/u labs ordered Unresulted Labs (From admission, onward)          Start     Ordered   10/11/20 0500  CBC with Differential/Platelet  Daily,   R     Question:  Specimen collection method  Answer:  Lab=Lab collect   10/10/20 0826   10/11/20 0500  Comprehensive metabolic panel  Daily,   R     Question:  Specimen collection method  Answer:  Lab=Lab collect   10/10/20 6010          Signed, Terrilee Croak, MD [facility] 10/12/2020

## 2020-10-12 NOTE — Evaluation (Signed)
Occupational Therapy Evaluation Patient Details Name: Ashley [name] MRN: [id number] DOB: 02-Mar-1946 Today's Date: 10/12/2020    History of Present Illness Pt is a 75 y.o. female admitted 10/08/20 with nausea, vomiting, abdominal pain; etiology unclear. Pt with chronic constipation, AKI on CKD IV. PMH includes anxiety, bipolar disorder, CKD, DM2, HTN, COPD, lung and vulvar cancer.   Clinical Impression   PTA, pt lives with spouse and reports typically independent with ADLs and mobility. Pt presents now with mild deficits in endurance, strength, and problem solving (slower processing/sequencing). Pt overall Supervision for mobility to/from bathroom with RW, cues for sequencing. Pt Independent with UB ADLs and Supervision for LB ADLs. Pt would benefit from further skilled OT services at acute level to maximize ADL endurance, safety with DME and optimal executive functioning during daily tasks. Pt declines [place] services at discharge.     Follow Up Recommendations  No OT follow up;Supervision - Intermittent (Pt declines HH services)    Equipment Recommendations  3 in 1 bedside commode;Other (comment) (RW)    Recommendations for Other Services       Precautions / Restrictions Precautions Precautions: Fall Restrictions Weight Bearing Restrictions: No      Mobility Bed Mobility Overal bed mobility: Modified Independent Bed Mobility: Supine to Sit           General bed mobility comments: in recliner on entry    Transfers Overall transfer level: Needs assistance Equipment used: Rolling walker (2 wheeled) Transfers: Sit to/from Stand Sit to Stand: Supervision         General transfer comment: Supervision for sit to stand from recliner/toilet, increased effort from lower toilet.    Balance Overall balance assessment: Needs assistance Sitting-balance support: Feet supported Sitting balance-Leahy Scale: Good     Standing balance support: Bilateral upper extremity  supported Standing balance-Leahy Scale: Fair Standing balance comment: Can static stand without UE support; dynamic stability improved with UE support on RW                           ADL either performed or assessed with clinical judgement   ADL Overall ADL's : Needs assistance/impaired Eating/Feeding: Independent;Sitting   Grooming: Set up;Standing   Upper Body Bathing: Independent;Sitting   Lower Body Bathing: Supervison/ safety;Sit to/from stand   Upper Body Dressing : Independent;Sitting   Lower Body Dressing: Sit to/from stand;Supervision/safety   Toilet Transfer: Supervision/safety;Ambulation;Regular Toilet;RW Toilet Transfer Details (indicate cue type and reason): for safety, no LOB noted Toileting- Clothing Manipulation and Hygiene: Supervision/safety;Sit to/from stand       Functional mobility during ADLs: Supervision/safety;Rolling walker;Cueing for safety General ADL Comments: Pt with mild deficits in strength, endurance impacting completion of ADLs/mobility     Vision Patient Visual Report: No change from baseline Vision Assessment?: No apparent visual deficits     Perception     Praxis      Pertinent Vitals/Pain Pain Assessment: No/denies pain Faces Pain Scale: Hurts a little bit Pain Location: Abdomen, pubic area Pain Descriptors / Indicators: Discomfort Pain Intervention(s): Monitored during session     Hand Dominance Right   Extremity/Trunk Assessment Upper Extremity Assessment Upper Extremity Assessment: Generalized weakness   Lower Extremity Assessment Lower Extremity Assessment: Defer to PT evaluation   Cervical / Trunk Assessment Cervical / Trunk Assessment: Kyphotic   Communication Communication Communication: No difficulties   Cognition Arousal/Alertness: Awake/alert Behavior During Therapy: WFL for tasks assessed/performed Overall Cognitive Status: Impaired/Different from baseline Area of Impairment: Problem solving  Problem Solving: Slow processing;Difficulty sequencing;Requires verbal cues General Comments: A&Ox4. Cooperative, slow processing and problem solving with lines (reports wiping with L UE though R handed due to IV in arm - educated that there is enough slack in line to wipe with R hand)   General Comments  VSS on RA. Discussed difficulty with tub transfers and tub bench use but noted with glass doors at tub/shower. Pt reports she feels ok with completing sponge bathing tasks at home until they get a walk in shower put in at home    Exercises     [place] expects to be discharged to:: Private residence Living Arrangements: Spouse/significant other Available Help at Discharge: Available PRN/intermittently;Family Type of Home: House Home Access: Stairs to enter [employer] of Steps: 2 Entrance Stairs-Rails: None Home Layout: One level     Bathroom Shower/Tub: Teacher, early years/pre: Standard     Home Equipment: Cane - single point;Walker - 2 wheels;Walker - 4 wheels          Prior Functioning/Environment Level of Independence: Independent with assistive device(s)        Comments: Brother is [place]. Husband at home but works during day and schedule is unpredictable. Reports bathing with washcloth sitting on commode because tub is too high. Uses cane around the house        OT Problem List: Decreased strength;Decreased activity tolerance;Impaired balance (sitting and/or standing);Decreased cognition;Decreased knowledge of use of DME or AE      OT Treatment/Interventions: Self-care/ADL training;Therapeutic exercise;Energy conservation;DME and/or AE instruction;Therapeutic activities;Balance training    OT Goals(Current goals can be found in the care plan section) Acute Rehab OT Goals Patient Stated Goal: be able to walk more OT Goal Formulation: With patient Time  For Goal Achievement: 10/26/20 Potential to Achieve Goals: Good ADL Goals Pt Will Perform Lower Body Dressing: with modified independence;sitting/lateral leans;sit to/from stand Pt Will Transfer to Toilet: with modified independence;ambulating Pt Will Perform Toileting - Clothing Manipulation and hygiene: with modified independence;sitting/lateral leans;sit to/from stand Pt/caregiver will Perform Home Exercise Program: Increased strength;Both right and left upper extremity;With theraband;Independently;With written HEP provided Additional ADL Goal #1: Pt to complete 2-3 trial making task without verbal cues  OT Frequency: Min 2X/week   Barriers to D/C:            Co-evaluation              AM-PAC OT "6 Clicks" Daily Activity     Outcome Measure Help from another person eating meals?: None Help from another person taking care of personal grooming?: A Little Help from another person toileting, which includes using toliet, bedpan, or urinal?: A Little Help from another person bathing (including washing, rinsing, drying)?: A Little Help from another person to put on and taking off regular upper body clothing?: None Help from another person to put on and taking off regular lower body clothing?: A Little 6 Click Score: 20   End of Session Equipment Utilized During Treatment: Rolling walker Nurse Communication: Mobility status  Activity Tolerance: Patient tolerated treatment well Patient left: in chair;with call bell/phone within reach;with chair alarm set  OT Visit Diagnosis: Unsteadiness on feet (R26.81);Other abnormalities of gait and mobility (R26.89);Muscle weakness (generalized) (M62.81)                Time: 9323-5573 OT Time Calculation (min): 18 min Charges:  OT General Charges $OT Visit: 1 Visit OT Evaluation $OT Eval Low Complexity: 1  Low  Malachy Chamber, OTR/L Acute Rehab Services Office: [phone]  Layla Maw 10/12/2020, 1:37 PM

## 2020-10-12 NOTE — TOC Progression Note (Addendum)
Transition of Care [facility]) - Progression Note    Patient Details  Name: Ashley [name] MRN: [id number] Date of Birth: 21-Nov-1945  Transition of Care [facility]) CM/SW Contact  Jacalyn Lefevre Edson Snowball, RN Phone Number: 10/12/2020, 11:37 AM  Clinical Narrative:     Spoke to PT, patient doing much better today and does not need wheel chair.   Spoke to patient at bedside. Discussed PT recommendations for HHPT and 3 in 1.   PAtient does not feel that she needs home health at this time, if she changes her mind after discharge she will ask her PCP to arrange.   Patient states she has a 3 in 1 at home already.   Patient states her brother is going to assist her with her medications at home.   NCM spoke to [facility] yesterday. Fritz Pickerel has NCM direct number, however yesterday he asked NCM to follow up with the patient today for plan.     Expected Discharge Plan: Home/Self Care Barriers to Discharge: Continued Medical Work up  Expected Discharge Plan and Services Expected Discharge Plan: Home/Self Care In-house Referral: NA Discharge Planning Services: CM Consult                     DME Arranged: N/A DME Agency: NA       HH Arranged: NA HH Agency: NA         Social Determinants of Health (SDOH) Interventions    Readmission Risk Interventions No flowsheet data found.

## 2020-10-12 NOTE — Progress Notes (Signed)
ANTICOAGULATION CONSULT NOTE - Follow Up Consult  Pharmacy Consult for Heparin Indication: h/o DVT, afib, and CVAs  Allergies  Allergen Reactions  . Sulfa Antibiotics Other (See Comments)    Pt states she had "extreme pain"  . Sulfacetamide Sodium Other (See Comments)    Pain    Patient Measurements: Height: 5' 3.5" (161.3 cm) Weight: 65.1 kg (143 lb 8.3 oz) IBW/kg (Calculated) : 53.55  Vital Signs: Temp: 98.7 F (37.1 C) (02/25 0535) Temp Source: Oral (02/25 0535) BP: 188/76 (02/25 0535) Pulse Rate: 75 (02/25 0535)  Labs: Recent Labs    10/10/20 0438 10/11/20 0317 10/12/20 0301  HGB 11.7* 10.7* 10.1*  HCT 34.0* 30.0* 30.6*  PLT 401* 318 294  LABPROT 19.9* 17.8* 19.0*  INR 1.8* 1.5* 1.7*  CREATININE 3.66* 4.12* 3.67*    Estimated Creatinine Clearance: 12.5 mL/min (A) (by C-G formula based on SCr of 3.67 mg/dL (H)).   Assessment: Anticoag: warfarin as PTA for hx RLE DVT(Eliquis 11/2019> chg to warf 03/2020 due to $$$ Eliquis per 03/29/20 office note). Also hx CVA 1994 and 2000. INR 1.7. Hgb down to 10.1. Plts down to 588(baseline)>294.  - 2/22: INR up 8.2 > Vit K 1mg  IV for potential EGD; no bleeding - 2/23: INR down 1.8, SCDs on >no EDG planned > warf resumed with 2.5 mg  PTA warfarin:  5 mg daily except 2.5 mg qMon (LD PTA 2/21). Admit INR 8.2. - last outpt IN[facility] 2.3 on 2/7 at [facility]  Goal of Therapy:  INR 2-3 Monitor platelets by anticoagulation protocol: Yes   Plan:  Repeat Coumadin 5mg  po x 1 again today.  Daily INR   Ashon Rosenberg S. Alford Highland, PharmD, BCPS Clinical Staff Pharmacist [email or web address] Alford Highland, [employer] 10/12/2020,8:38 AM

## 2020-10-13 LAB — COMPREHENSIVE METABOLIC PANEL
ALT: 15 U/L (ref 0–44)
AST: 15 U/L (ref 15–41)
Albumin: 2.1 g/dL — ABNORMAL LOW (ref 3.5–5.0)
Alkaline Phosphatase: 33 U/L — ABNORMAL LOW (ref 38–126)
Anion gap: 9 (ref 5–15)
BUN: 58 mg/dL — ABNORMAL HIGH (ref 8–23)
CO2: 19 mmol/L — ABNORMAL LOW (ref 22–32)
Calcium: 8 mg/dL — ABNORMAL LOW (ref 8.9–10.3)
Chloride: 103 mmol/L (ref 98–111)
Creatinine, Ser: 3.48 mg/dL — ABNORMAL HIGH (ref 0.44–1.00)
GFR, Estimated: 13 mL/min — ABNORMAL LOW (ref >60)
Glucose, Bld: 271 mg/dL — ABNORMAL HIGH (ref 70–99)
Potassium: 3.1 mmol/L — ABNORMAL LOW (ref 3.5–5.1)
Sodium: 131 mmol/L — ABNORMAL LOW (ref 135–145)
Total Bilirubin: 0.7 mg/dL (ref 0.3–1.2)
Total Protein: 4.6 g/dL — ABNORMAL LOW (ref 6.5–8.1)

## 2020-10-13 LAB — CBC WITH DIFFERENTIAL/PLATELET
Abs Immature Granulocytes: 0.07 10*3/uL (ref 0.00–0.07)
Basophils Absolute: 0 10*3/uL (ref 0.0–0.1)
Basophils Relative: 0 %
Eosinophils Absolute: 0.1 10*3/uL (ref 0.0–0.5)
Eosinophils Relative: 1 %
HCT: 28.6 % — ABNORMAL LOW (ref 36.0–46.0)
Hemoglobin: 9.3 g/dL — ABNORMAL LOW (ref 12.0–15.0)
Immature Granulocytes: 1 %
Lymphocytes Relative: 12 %
Lymphs Abs: 0.9 10*3/uL (ref 0.7–4.0)
MCH: 29.4 pg (ref 26.0–34.0)
MCHC: 32.5 g/dL (ref 30.0–36.0)
MCV: 90.5 fL (ref 80.0–100.0)
Monocytes Absolute: 0.6 10*3/uL (ref 0.1–1.0)
Monocytes Relative: 8 %
Neutro Abs: 5.6 10*3/uL (ref 1.7–7.7)
Neutrophils Relative %: 78 %
Platelets: 265 10*3/uL (ref 150–400)
RBC: 3.16 MIL/uL — ABNORMAL LOW (ref 3.87–5.11)
RDW: 13.2 % (ref 11.5–15.5)
WBC: 7.2 10*3/uL (ref 4.0–10.5)
nRBC: 0 % (ref 0.0–0.2)

## 2020-10-13 LAB — GLUCOSE, CAPILLARY
Glucose-Capillary: 165 mg/dL — ABNORMAL HIGH (ref 70–99)
Glucose-Capillary: 202 mg/dL — ABNORMAL HIGH (ref 70–99)
Glucose-Capillary: 208 mg/dL — ABNORMAL HIGH (ref 70–99)
Glucose-Capillary: 223 mg/dL — ABNORMAL HIGH (ref 70–99)

## 2020-10-13 MED ORDER — POTASSIUM CHLORIDE CRYS ER 20 MEQ PO TBCR
20.0000 meq | EXTENDED_RELEASE_TABLET | Freq: Two times a day (BID) | ORAL | Status: DC
  Administered 2020-10-13 – 2020-10-14 (×3): 20 meq via ORAL
  Filled 2020-10-13 (×3): qty 1

## 2020-10-13 MED ORDER — POTASSIUM CHLORIDE CRYS ER 20 MEQ PO TBCR
40.0000 meq | EXTENDED_RELEASE_TABLET | Freq: Once | ORAL | Status: AC
  Administered 2020-10-13: 40 meq via ORAL
  Filled 2020-10-13: qty 2

## 2020-10-13 MED ORDER — WARFARIN SODIUM 5 MG PO TABS
5.0000 mg | ORAL_TABLET | Freq: Once | ORAL | Status: AC
  Administered 2020-10-13: 5 mg via ORAL
  Filled 2020-10-13: qty 1

## 2020-10-13 MED ORDER — HYDRALAZINE HCL 50 MG PO TABS
50.0000 mg | ORAL_TABLET | Freq: Three times a day (TID) | ORAL | Status: DC
  Administered 2020-10-13 – 2020-10-14 (×3): 50 mg via ORAL
  Filled 2020-10-13 (×3): qty 1

## 2020-10-13 MED ORDER — INSULIN GLARGINE 100 UNIT/ML ~~LOC~~ SOLN
25.0000 [IU] | Freq: Every day | SUBCUTANEOUS | Status: DC
  Administered 2020-10-13: 25 [IU] via SUBCUTANEOUS
  Filled 2020-10-13 (×2): qty 0.25

## 2020-10-13 NOTE — Plan of Care (Signed)
  Problem: Health Behavior/Discharge Planning: Goal: Ability to manage health-related needs will improve Outcome: Progressing   Problem: Activity: Goal: Risk for activity intolerance will decrease Outcome: Progressing   Problem: Coping: Goal: Level of anxiety will decrease Outcome: Progressing   Problem: Elimination: Goal: Will not experience complications related to bowel motility Outcome: Progressing

## 2020-10-13 NOTE — Progress Notes (Signed)
PROGRESS NOTE  Ashley [name]  DOB: [birth year]  PCP: Unk Pinto, MD KNL:[id number]  DOA: 10/08/2020  LOS: 2 days   Chief Complaint  Patient presents with  . Constipation   Brief narrative: Ashley [name] is a 75 y.o. female with PMH significant for T2DM, HTN, DVT on Coumadin, CKD stage IV, lung cancer in remission, vulvar cancer in remission, COPD, GERD, hiatal hernia, chronic iron deficiency, anxiety/bipolar disorder. Patient presented to the ED on 10/08/2020 with complaint of diffuse generalized abdominal pain for 3 to 4 weeks, with progressive generalized weakness, weight loss, constipation and dehydration.  She was seen at GI office as an outpatient on 08/27/2020.  She underwent CT abdomen pelvis on 09/19/2020 which did not show any acute findings to explain her GI symptoms.  In the ED, patient had blood pressure 204/89 Labs with potassium low at 2.1, BUN/creatinine elevated to 9 9/1.5, INR elevated to 6.9.  Patient was admitted to hospitalist service. GI consultation was obtained.  Subjective: Patient was seen and examined this morning.  After MiraLAX was stopped, patient not having diarrhea but is still leaking and having urgency at times. Foley catheter removed yesterday.  Able to void by self [place] this morning shows improvement in creatinine down to 3.48.  Blood pressure still remains elevated and was over 200 this morning.  Assessment/Plan: Acute on chronic generalized abdominal pain with nausea and vomiting Chronic constipation -Unclear etiology, CT abdomen on 2/2 was unrevealing.  Lipase normal.  GI consultation obtained.  Patient evidently has had an extensive evaluation as an outpatient.  GI consultation was obtained.  Per GI, low yield of planning EGD.  It is possible that chronic constipation could have led her to have chronic abdominal pain.  Constipation regimen adjusted.  Patient feels better after bowel movement yesterday. -Currently on Bentyl 10 mg 3 times daily  scheduled, Senokot scheduled, MiraLAX scheduled.  She is complaining of leaking stool. Switch MiraLAX to as needed  Acute kidney injury superimposed on CKD stage IV, not on dialysis -Baseline creatinine 3-3.1.  Presented with a creatinine of 4.5. Renal function likely worsened due to dehydration, poor p.o. intake in the last month.   -With IV hydration, creatinine is gradually improving, 3.67 today.  Renal ultrasound did not show any abnormality.  Patient follows up with nephrologist Dr. [name] as an outpatient. -Continue to hold torsemide, chlorthalidone Recent Labs    12/01/19 1430 03/06/20 1707 04/19/20 1239 06/19/20 1507 10/08/20 2030 10/09/20 0924 10/10/20 0438 10/11/20 0317 10/12/20 0301 10/13/20 0311  BUN 43* 49* 56* 46* 99* 103* 78* 67* 63* 58*  CREATININE 3.09* 3.17* 3.33* 3.12* 4.59* 4.36* 3.66* 4.12* 3.67* 3.48*   Severe hypokalemia -Potassium level is improving but still remains low, 3.2 this morning.  Continue scheduled potassium.  Also give 40 mEq replacement today. -Repeat labs tomorrow. Recent Labs  Lab 10/09/20 0426 10/09/20 0924 10/09/20 1635 10/10/20 0438 10/11/20 0317 10/12/20 0301 10/13/20 0311  K  --    < > 2.6* 2.9* 3.3* 3.2* 3.1*  MG 2.8*  --   --   --   --   --   --    < > = values in this interval not displayed.   Hyponatremia -Improving on IV fluid. Recent Labs  Lab 10/08/20 2030 10/09/20 0924 10/10/20 0438 10/11/20 0317 10/12/20 0301 10/13/20 0311  NA 130* 132* 135 134* 133* 131*   Hypertensive urgency -Blood pressure mostly remains elevated. -Home meds include torsemide 20 mg daily, chlorthalidone 25 mg daily, amlodipine  10 mg daily.   -Currently the chlorthalidone and torsemide are on hold because of AKI.  -Currently on amlodipine 10 mg daily and also hydralazine 25 mg daily.  Increase to 50 mg daily this morning.   -Echocardiogram with EF 60 to 65% and grade 1 diastolic dysfunction.  Uncontrolled type 2 diabetes mellitus  -A1c  8.6 on 10/10/2020 -At home, patient uses Novolin 25 to 50 units twice daily. -Currently on Lantus 20 units nightly and sliding scale insulin with Accu-Cheks.  Fasting blood sugar elevated 165 this morning.  I will increase Lantus to 25 units for tonight. Recent Labs  Lab 10/12/20 1114 10/12/20 1709 10/12/20 2226 10/13/20 0638 10/13/20 1115  GLUCAP 218* 189* 238* 165* 202*   Acute urinary retention -Foley catheter placed in ED, more than 1.5 L emptied.  Foley catheter removed on 2/25.  Able to void by self.  Renal ultrasound ruled out hydronephrosis, mass or stone.  History of DVT, on anticoagulation -INR 6.9 on admission.  Improved. Coumadin resumed. -Continue to monitor INR Recent Labs  Lab 10/09/20 0924 10/09/20 1635 10/10/20 0438 10/11/20 0317 10/12/20 0301  INR 8.2* 2.6* 1.8* 1.5* 1.7*   Chronic iron deficiency -On chronic iron repletion.  May be a challenge because of constipation.  Ferritin level is appropriate at 180 on 2/23.  We will stop further IV or oral iron replacement at this time. Recent Labs    10/09/20 0924 10/10/20 0438 10/10/20 0447 10/11/20 0317 10/12/20 0301 10/13/20 0311  HGB 12.7 11.7*  --  10.7* 10.1* 9.3*  MCV 84.9 85.6  --  86.0 88.7 90.5  VITAMINB12  --  1,461*  --   --   --   --   FOLATE  --   --  11.1  --   --   --   FERRITIN  --  180  --   --   --   --   TIBC  --  241*  --   --   --   --   IRON  --  138  --   --   --   --   RETICCTPCT  --   --  1.6  --   --   --    COPD -Currently no wheezing, patient has stopped smoking last week.  Declined nicotine patch.  Hypothyroidism -Continue Synthroid  GERD (gastroesophageal reflux disease) -Continue PPI  Mobility: Encourage ambulation Code Status:   Code Status: Full Code  Nutritional status: Body mass index is 25.03 kg/m.     Diet Order            Diet Carb Modified Fluid consistency: Thin; Room service appropriate? Yes  Diet effective now                 DVT  prophylaxis: SCDs Start: 10/09/20 1140   Antimicrobials:  None Fluid: Reduce normal saline to 50 mill per hour Consultants: GI Family Communication:  None at bedside  Status is: Inpatient  Dispo: The patient is from: Home              Anticipated d/c is to: Home with home health              Anticipated d/c date is: 1 to 2 days.              Patient currently is not medically stable to d/c.   Difficult to place patient No   Infusions:  . sodium chloride 50 mL/hr at 10/13/20 [phone]  Scheduled Meds: . amLODipine  10 mg Oral Daily  . atorvastatin  40 mg Oral Daily  . Chlorhexidine Gluconate Cloth  6 each Topical Daily  . fenofibrate  54 mg Oral Daily  . hydrALAZINE  50 mg Oral Q8H  . insulin aspart  0-5 Units Subcutaneous QHS  . insulin aspart  0-9 Units Subcutaneous TID WC  . insulin glargine  25 Units Subcutaneous QHS  . levothyroxine  88 mcg Oral QAC breakfast  . pantoprazole  40 mg Oral Daily  . potassium chloride  20 mEq Oral BID  . senna-docusate  1 tablet Oral BID  . warfarin  5 mg Oral ONCE-1600  . Warfarin - Pharmacist Dosing Inpatient   Does not apply q1600    Antimicrobials: Anti-infectives (From admission, onward)   None      PRN meds: ondansetron (ZOFRAN) IV, polyethylene glycol   Objective: Vitals:   10/13/20 0604 10/13/20 0800  BP: (!) 204/87 (!) 160/76  Pulse: 85 87  Resp: 18 18  Temp: 98.2 F (36.8 C) 98.1 F (36.7 C)  SpO2: 98% 98%    Intake/Output Summary (Last 24 hours) at 10/13/2020 1117 Last data filed at 10/13/2020 0918 Gross per 24 hour  Intake 1873.7 ml  Output 2575 ml  Net -701.3 ml   Filed Weights   10/11/20 2028 10/12/20 2013  Weight: 65.1 kg 65.1 kg   Weight change: 0.002 kg Body mass index is 25.03 kg/m.   Physical Exam: General exam: Pleasant, elderly female.  Not in distress. Skin: No rashes, lesions or ulcers. HEENT: Atraumatic, normocephalic, no obvious bleeding Lungs: Clear to auscultation bilaterally CVS:  Regular rate and rhythm, no murmur GI/Abd soft, nontender, nondistended, bowel sounds present CNS: Alert, awake, oriented x3 Psychiatry: Depressed look Extremities: No pedal edema, no calf redness  Data Review: I have personally reviewed the laboratory data and studies available.  Recent Labs  Lab 10/09/20 0924 10/10/20 0438 10/11/20 0317 10/12/20 0301 10/13/20 0311  WBC 13.1* 12.8* 9.9 7.8 7.2  NEUTROABS  --   --  7.8* 5.7 5.6  HGB 12.7 11.7* 10.7* 10.1* 9.3*  HCT 37.2 34.0* 30.0* 30.6* 28.6*  MCV 84.9 85.6 86.0 88.7 90.5  PLT 427* 401* 318 294 265   Recent Labs  Lab 10/09/20 0426 10/09/20 0924 10/09/20 1635 10/10/20 0438 10/11/20 0317 10/12/20 0301 10/13/20 0311  NA  --  132*  --  135 134* 133* 131*  K  --  2.3* 2.6* 2.9* 3.3* 3.2* 3.1*  CL  --  95*  --  98 101 102 103  CO2  --  21*  --  23 23 19* 19*  GLUCOSE  --  94  --  105* 108* 210* 271*  BUN  --  103*  --  78* 67* 63* 58*  CREATININE  --  4.36*  --  3.66* 4.12* 3.67* 3.48*  CALCIUM  --  8.8*  --  9.0 8.5* 8.3* 8.0*  MG 2.8*  --   --   --   --   --   --     F/u labs ordered Unresulted Labs (From admission, onward)          Start     Ordered   10/14/20 0500  Protime-INR  Daily,   R     Question:  Specimen collection method  Answer:  Lab=Lab collect   10/13/20 0825   10/14/20 7001  Basic metabolic panel  Daily,   R     Question:  Specimen  collection method  Answer:  Lab=Lab collect   10/13/20 0830   10/14/20 0500  Magnesium  Tomorrow morning,   R       Question:  Specimen collection method  Answer:  Lab=Lab collect   10/13/20 0830          Signed, Terrilee Croak, MD [facility] 10/13/2020

## 2020-10-13 NOTE — Progress Notes (Signed)
ANTICOAGULATION CONSULT NOTE - Follow Up Consult  Pharmacy Consult for Heparin Indication: h/o DVT, afib, and CVAs  Allergies  Allergen Reactions  . Sulfa Antibiotics Other (See Comments)    Pt states she had "extreme pain"  . Sulfacetamide Sodium Other (See Comments)    Pain    Patient Measurements: Height: 5' 3.5" (161.3 cm) Weight: 65.1 kg (143 lb 8.4 oz) IBW/kg (Calculated) : 53.55  Vital Signs: Temp: 98.2 F (36.8 C) (02/26 0604) Temp Source: Oral (02/26 0604) BP: 204/87 (02/26 0604) Pulse Rate: 85 (02/26 0604)  Labs: Recent Labs    10/11/20 0317 10/12/20 0301 10/13/20 0311  HGB 10.7* 10.1* 9.3*  HCT 30.0* 30.6* 28.6*  PLT 318 294 265  LABPROT 17.8* 19.0*  --   INR 1.5* 1.7*  --   CREATININE 4.12* 3.67* 3.48*    Estimated Creatinine Clearance: 13.2 mL/min (A) (by C-G formula based on SCr of 3.48 mg/dL (H)).   Assessment: Anticoag: warfarin as PTA for hx RLE DVT(Eliquis 11/2019> chg to warf 03/2020 due to $$ Eliquis per 03/29/20 office note). Also hx CVA 1994 and 2000. INR 1.7. No INR 2/26. Hgb down to 9.3. Plts down to 588(baseline)>265. No heparin.  - 2/22: INR up 8.2 > Vit K 1mg  IV for potential EGD; no bleeding - 2/23: INR down 1.8, SCDs on >no EDG planned > warf resumed with 2.5 mg  PTA warfarin:  5 mg daily except 2.5 mg qMon (LD PTA 2/21). Admit INR 8.2. - last outpt INR 2.3 on 2/7 at [facility]  Goal of Therapy:  INR 2-3 Monitor platelets by anticoagulation protocol: Yes   Plan:  Warfarin 5mg  x 1 today Daily PT/INR Kdur 43meq extra today. Recheck in AM   Jannetta Massey S. Alford Highland, PharmD, BCPS Clinical Staff Pharmacist [email or web address] Alford Highland, [employer] 10/13/2020,8:31 AM

## 2020-10-14 LAB — MAGNESIUM: Magnesium: 1.6 mg/dL — ABNORMAL LOW (ref 1.7–2.4)

## 2020-10-14 LAB — BASIC METABOLIC PANEL
Anion gap: 12 (ref 5–15)
BUN: 49 mg/dL — ABNORMAL HIGH (ref 8–23)
CO2: 19 mmol/L — ABNORMAL LOW (ref 22–32)
Calcium: 8.3 mg/dL — ABNORMAL LOW (ref 8.9–10.3)
Chloride: 104 mmol/L (ref 98–111)
Creatinine, Ser: 3.01 mg/dL — ABNORMAL HIGH (ref 0.44–1.00)
GFR, Estimated: 16 mL/min — ABNORMAL LOW (ref >60)
Glucose, Bld: 113 mg/dL — ABNORMAL HIGH (ref 70–99)
Potassium: 3.1 mmol/L — ABNORMAL LOW (ref 3.5–5.1)
Sodium: 135 mmol/L (ref 135–145)

## 2020-10-14 LAB — GLUCOSE, CAPILLARY
Glucose-Capillary: 61 mg/dL — ABNORMAL LOW (ref 70–99)
Glucose-Capillary: 109 mg/dL — ABNORMAL HIGH (ref 70–99)

## 2020-10-14 LAB — PROTIME-INR
INR: 2.1 — ABNORMAL HIGH (ref 0.8–1.2)
Prothrombin Time: 22.6 seconds — ABNORMAL HIGH (ref 11.4–15.2)

## 2020-10-14 MED ORDER — WARFARIN SODIUM 5 MG PO TABS: 5.0000 mg | ORAL_TABLET | Freq: Once | ORAL | Status: DC

## 2020-10-14 MED ORDER — HYDRALAZINE HCL 50 MG PO TABS: 100.0000 mg | ORAL_TABLET | Freq: Three times a day (TID) | ORAL | Status: DC

## 2020-10-14 MED ORDER — POTASSIUM CHLORIDE CRYS ER 20 MEQ PO TBCR
40.0000 meq | EXTENDED_RELEASE_TABLET | ORAL | Status: AC
  Administered 2020-10-14 (×2): 40 meq via ORAL
  Filled 2020-10-14 (×2): qty 2

## 2020-10-14 MED ORDER — MAGNESIUM SULFATE 2 GM/50ML IV SOLN
2.0000 g | Freq: Once | INTRAVENOUS | Status: AC
  Administered 2020-10-14: 2 g via INTRAVENOUS
  Filled 2020-10-14: qty 50

## 2020-10-14 MED ORDER — HYDRALAZINE HCL 100 MG PO TABS: 100.0000 mg | ORAL_TABLET | Freq: Three times a day (TID) | ORAL | 2 refills | Status: DC

## 2020-10-14 NOTE — Plan of Care (Signed)
  Problem: Health Behavior/Discharge Planning: Goal: Ability to manage health-related needs will improve Outcome: Progressing   Problem: Activity: Goal: Risk for activity intolerance will decrease Outcome: Progressing   Problem: Coping: Goal: Level of anxiety will decrease Outcome: Progressing   Problem: Elimination: Goal: Will not experience complications related to bowel motility Outcome: Progressing   Problem: Safety: Goal: Ability to remain free from injury will improve Outcome: Progressing

## 2020-10-14 NOTE — Discharge Summary (Signed)
Physician Discharge Summary  Ashley [name] CLE:[id number] DOB: May 20, 1946 DOA: 10/08/2020  PCP: Unk Pinto, MD  Admit date: 10/08/2020 Discharge date: 10/14/2020  Admitted From: Home Discharge disposition: Home with home health PT   Code Status: Full Code  Diet Recommendation: Cardiac/diabetic diet  Discharge Diagnosis:   Principal Problem:   Generalized abdominal pain Active Problems:   COPD   Bipolar depression ([place])   Hypothyroidism   GERD (gastroesophageal reflux disease)   Peripheral arterial disease ([place])   Diabetes mellitus type 2, uncontrolled ([place])   Obesity (BMI 30.0-34.9)   Long term (current) use of insulin ([facility])   Hypokalemia   CKD (chronic kidney disease), stage IV ([facility])   Nausea and vomiting   Constipation   Acute renal failure superimposed on stage 5 chronic kidney disease, not on chronic dialysis ([place])   AKI (acute kidney injury) [facility])   Chief Complaint  Patient presents with  . Constipation   Brief narrative: Ashley [name] is a 75 y.o. female with PMH significant for T2DM, HTN, DVT on Coumadin, CKD stage IV, lung cancer in remission, vulvar cancer in remission, COPD, GERD, hiatal hernia, chronic iron deficiency, anxiety/bipolar disorder. Patient presented to the ED on 10/08/2020 with complaint of diffuse generalized abdominal pain for 3 to 4 weeks, with progressive generalized weakness, weight loss, constipation and dehydration.  She was seen at GI office as an outpatient on 08/27/2020.  She underwent CT abdomen pelvis on 09/19/2020 which did not show any acute findings to explain her GI symptoms.  In the ED, patient had blood pressure 204/89 Labs with potassium low at 2.1, BUN/creatinine elevated to 9 9/1.5, INR elevated to 6.9.  Patient was admitted to hospitalist service. See below for details.  Subjective: Patient was seen and examined this morning.  Propped up in bed.  Not in distress.  Diarrhea improved.  Renal function improved.  Blood  pressure improved. Wants to go home today.  Hospital course: Acute on chronic generalized abdominal pain with nausea and vomiting Chronic constipation -Unclear etiology, CT abdomen on 2/2 was unrevealing.  Lipase normal. Patient previously had an extensive evaluation as an outpatient.  GI consultation was obtained.  Per GI, low yield of EGD.  It is possible that chronic constipation could have led her to have chronic abdominal pain.  Constipation regimen was adjusted.  She was given MiraLAX scheduled with subsequent bowel movement afterwards abdominal pain improved.   -Currently on Senokot Senokot scheduled, MiraLAX as needed.  Discharged with the same regimen.  Acute kidney injury superimposed on CKD stage IV, not on dialysis -Baseline creatinine 3-3.1.  Presented with a creatinine of 4.5. Renal function likely worsened due to dehydration, poor p.o. intake in the last month.   -With IV hydration, creatinine is gradually improved down to baseline, 3.01 today. Renal ultrasound did not show any abnormality. Patient follows up with nephrologist Dr. [name] as an outpatient. -At discharge, I would continue to hold torsemide, chlorthalidone. Recent Labs    03/06/20 1707 04/19/20 1239 06/19/20 1507 10/08/20 2030 10/09/20 0924 10/10/20 0438 10/11/20 0317 10/12/20 0301 10/13/20 0311 10/14/20 0431  BUN 49* 56* 46* 99* 103* 78* 67* 63* 58* 49*  CREATININE 3.17* 3.33* 3.12* 4.59* 4.36* 3.66* 4.12* 3.67* 3.48* 3.01*   Hypertensive urgency -Blood pressure was elevated to over 944 systolic on admission. -Home meds include torsemide 20 mg daily, chlorthalidone 25 mg daily, amlodipine 10 mg daily.   -Amlodipine was continued.  Chlorthalidone and torsemide were held because of AKI.  In  order to control blood pressure, hydralazine was added and dose adjusted.   -Echocardiogram 2/25 with EF 60 to 65% and grade 1 diastolic dysfunction. -We will discharge her with amlodipine 10 mg daily, hydralazine 100  mg 3 times daily.  I would continue to hold chlorthalidone and torsemide at this time.  Patient will follow up with her nephrologist Dr. [name] as an outpatient..  Severe hypokalemia -Potassium level was low at 2.8 on admission.  Aggressively replaced.   -Continue supplement at home.   -Magnesium level low at 1.6 today.  IV replacement given. Recent Labs  Lab 10/09/20 0426 10/09/20 0924 10/10/20 0438 10/11/20 0317 10/12/20 0301 10/13/20 0311 10/14/20 0431  K  --    < > 2.9* 3.3* 3.2* 3.1* 3.1*  MG 2.8*  --   --   --   --   --  1.6*   < > = values in this interval not displayed.   Hyponatremia -Improved with IV fluid. Recent Labs  Lab 10/08/20 2030 10/09/20 0924 10/10/20 0438 10/11/20 0317 10/12/20 0301 10/13/20 0311 10/14/20 0431  NA 130* 132* 135 134* 133* 131* 135   Uncontrolled type 2 diabetes mellitus  -A1c 8.6 on 10/10/2020 -At home, patient uses Novolin 25 to 50 units twice daily. -I will discharge her with the same regimen. -Continue to follow-up with PCP as an outpatient for adjustment.  Acute urinary retention -Foley catheter placed in ED, more than 1.5 L emptied.  Foley catheter removed on 2/25.  Able to void by self.  Renal ultrasound ruled out hydronephrosis, mass or stone.  History of DVT, on anticoagulation -INR 6.9 on admission.  Improved. Coumadin resumed. -INR 2.1 today. Recent Labs  Lab 10/09/20 1635 10/10/20 0438 10/11/20 0317 10/12/20 0301 10/14/20 0431  INR 2.6* 1.8* 1.5* 1.7* 2.1*   Chronic iron deficiency -On chronic iron repletion.  Ferritin level is appropriate at 180 on 2/23.  Recent Labs    10/09/20 0924 10/10/20 0438 10/10/20 0447 10/11/20 0317 10/12/20 0301 10/13/20 0311  HGB 12.7 11.7*  --  10.7* 10.1* 9.3*  MCV 84.9 85.6  --  86.0 88.7 90.5  VITAMINB12  --  1,461*  --   --   --   --   FOLATE  --   --  11.1  --   --   --   FERRITIN  --  180  --   --   --   --   TIBC  --  241*  --   --   --   --   IRON  --  138  --   --    --   --   RETICCTPCT  --   --  1.6  --   --   --    COPD -Currently no wheezing, patient has stopped smoking last week.  Declined nicotine patch.  Hypothyroidism -Continue Synthroid  GERD (gastroesophageal reflux disease) -Continue PPI  Peripheral neuropathy Depression -Continue antidepressants.  Patient states her peripheral neuropathy meds were adjusted by psychiatrist.  Continue to follow.  Stable for discharge to home today with home health PT  Wound care:    Discharge Exam:   Vitals:   10/13/20 1653 10/13/20 2047 10/14/20 0518 10/14/20 0926  BP: (!) 170/75 (!) 194/83 (!) 184/75 (!) 171/78  Pulse: 89 90 82 82  Resp: 18 19 18 18   Temp: 97.9 F (36.6 C) 98.2 F (36.8 C) 98.8 F (37.1 C) 98.8 F (37.1 C)  TempSrc: Oral Oral Oral Oral  SpO2: 97% 99% 98% 98%  Weight:      Height:        Body mass index is 25.03 kg/m.  General exam: Pleasant, elderly Caucasian female.  Not in distress Skin: No rashes, lesions or ulcers. HEENT: Atraumatic, normocephalic, no obvious bleeding Lungs: Clear to auscultate bilaterally CVS: Regular rate and rhythm, no murmur GI/Abd soft, nontender, nondistended, bowel sound present CNS: Alert, awake abound x3 Psychiatry: Mood appropriate Extremities: No pedal edema, no calf tenderness  Follow ups:   Discharge Instructions    Diet - low sodium heart healthy   Complete by: As directed    Diet Carb Modified   Complete by: As directed    Increase activity slowly   Complete by: As directed       Follow-up Information    Unk Pinto, MD Follow up.   Specialty: Internal Medicine Contact information: [address] [place] [address] [phone]               Recommendations for Outpatient Follow-Up:   1. Follow-up with PCP as an outpatient 2. Follow-up with nephrologist as an outpatient  Discharge Instructions:  Follow with Primary MD Unk Pinto, MD in 7 days   Get CBC/BMP checked  in next visit within 1 week by PCP or SNF MD ( we routinely change or add medications that can affect your baseline labs and fluid status, therefore we recommend that you get the mentioned basic workup next visit with your PCP, your PCP may decide not to get them or add new tests based on their clinical decision)  On your next visit with your PCP, please Get Medicines reviewed and adjusted.  Please request your PCP  to go over all Hospital Tests and Procedure/Radiological results at the follow up, please get all Hospital records sent to your Prim MD by signing hospital release before you go home.  Activity: As tolerated with Full fall precautions use walker/cane & assistance as needed  For Heart failure patients - Check your Weight same time everyday, if you gain over 2 pounds, or you develop in leg swelling, experience more shortness of breath or chest pain, call your Primary MD immediately. Follow Cardiac Low Salt Diet and 1.5 lit/day fluid restriction.  If you have smoked or chewed Tobacco in the last 2 yrs please stop smoking, stop any regular Alcohol  and or any Recreational drug use.  If you experience worsening of your admission symptoms, develop shortness of breath, life threatening emergency, suicidal or homicidal thoughts you must seek medical attention immediately by calling 911 or calling your MD immediately  if symptoms less severe.  You Must read complete instructions/literature along with all the possible adverse reactions/side effects for all the Medicines you take and that have been prescribed to you. Take any new Medicines after you have completely understood and accpet all the possible adverse reactions/side effects.   Do not drive, operate heavy machinery, perform activities at heights, swimming or participation in water activities or provide baby sitting services if your were admitted for syncope or siezures until you have seen by Primary MD or a Neurologist and advised to do so  again.  Do not drive when taking Pain medications.  Do not take more than prescribed Pain, Sleep and Anxiety Medications  Wear Seat belts while driving.   Please note You were cared for by a hospitalist during your hospital stay. If you have any questions about your discharge medications or the care you received while you were  in the hospital after you are discharged, you can call the unit and asked to speak with the hospitalist on call if the hospitalist that took care of you is not available. Once you are discharged, your primary care physician will handle any further medical issues. Please note that NO REFILLS for any discharge medications will be authorized once you are discharged, as it is imperative that you return to your primary care physician (or establish a relationship with a primary care physician if you do not have one) for your aftercare needs so that they can reassess your need for medications and monitor your lab values.    Allergies as of 10/14/2020      Reactions   Sulfa Antibiotics Other (See Comments)   Pt states she had "extreme pain"   Sulfacetamide Sodium Other (See Comments)   Pain      Medication List    STOP taking these medications   chlorthalidone 25 MG tablet Commonly known as: HYGROTON   prochlorperazine 10 MG tablet Commonly known as: COMPAZINE   torsemide 20 MG tablet Commonly known as: DEMADEX     TAKE these medications   acetaminophen 500 MG tablet Commonly known as: TYLENOL Take 1,000 mg by mouth every 6 (six) hours as needed for mild pain or headache.   amLODipine 10 MG tablet Commonly known as: NORVASC Take 1 tablet (10 mg total) by mouth daily.   aspirin EC 81 MG tablet Take 81 mg by mouth daily.   atorvastatin 40 MG tablet Commonly known as: LIPITOR TAKE 1 TABLET BY MOUTH EVERY DAY FOR CHOLESTEROL What changed:   how much to take  how to take this  when to take this  additional instructions   azelastine 0.1 % nasal  spray Commonly known as: ASTELIN PLACE 2 SPRAYS INTO BOTH NOSTRILS 2 (TWO) TIMES DAILY. USE IN EACH NOSTRIL AS DIRECTED What changed:   when to take this  reasons to take this  additional instructions   clorazepate 7.5 MG tablet Commonly known as: Tranxene-T Take    1/2 to 1 tablet    1 or 2 x /day    ONLY if needed for  Nerves What changed:   how much to take  how to take this  when to take this  reasons to take this  additional instructions   famotidine 20 MG tablet Commonly known as: PEPCID TAKE 1 TABLET TWICE A DAY WITH MEALS FOR INDIGESTION. What changed:   how much to take  how to take this  when to take this  additional instructions   fenofibrate 145 MG tablet Commonly known as: TRICOR TAKE 1 TABLET DAILY FOR TRIGLYCERIDES (BLOOD FATS) What changed: See the new instructions.   ferrous sulfate 325 (65 FE) MG tablet TAKE 1 TABLET 2 TIMES A DAY WITH A MEAL FOR IRON DEFICIENCY What changed: See the new instructions.   FLUoxetine 20 MG capsule Commonly known as: PROZAC Take 20 mg by mouth daily.   hydrALAZINE 100 MG tablet Commonly known as: APRESOLINE Take 1 tablet (100 mg total) by mouth every 8 (eight) hours.   levothyroxine 88 MCG tablet Commonly known as: SYNTHROID Take   1 tablet    Daily   on an empty stomach with only water for 30 minutes & no Antacid meds, Calcium or Magnesium for 4 hours & avoid Biotin What changed:   how much to take  how to take this  when to take this  additional instructions   multivitamin with minerals  tablet Take 1 tablet by mouth daily.   NovoLIN N 100 UNIT/ML injection Generic drug: insulin NPH Human INJECT 50 UNITS EVERY MORNING AND 25 UNITS EVERY EVENING What changed: See the new instructions.   OneTouch Ultra test strip Generic drug: glucose blood TEST BLOOD SUGAR 3 TIMES A DAY   polyethylene glycol 17 g packet Commonly known as: MIRALAX / GLYCOLAX Take 17 g by mouth daily. What changed:    when to take this  reasons to take this   potassium chloride 10 MEQ tablet Commonly known as: Klor-Con M10 TAKE 1 TABLET 3 X /DAY WITH MEALS FOR POTASSIUM What changed:   how much to take  how to take this  when to take this  additional instructions   vitamin C 500 MG tablet Commonly known as: ASCORBIC ACID Take 500 mg by mouth daily. Take with iron/ferrous sulfate   Vitamin D3 25 MCG (1000 UT) Caps Take by mouth. Takes 2 to 3 capsules daily.   warfarin 5 MG tablet Commonly known as: COUMADIN Take as directed. If you are unsure how to take this medication, talk to your nurse or doctor. Original instructions: TAKE 1 TABLET BY MOUTH EVERY DAY What changed:   how much to take  when to take this  additional instructions            Durable Medical Equipment  (From admission, onward)         Start     Ordered   10/11/20 1546  For home use only DME 3 n 1  Once        10/11/20 1545   10/11/20 1545  For home use only DME standard manual wheelchair with seat cushion  Once       Comments: Patient suffers from Acute on chronic generalized abdominal painwith nausea and vomiting Chronic constipation which impairs their ability to perform daily activities like ambulating  in the home.  A cane will not resolve issue with performing activities of daily living. A wheelchair will allow patient to safely perform daily activities. Patient can safely propel the wheelchair in the home or has a caregiver who can provide assistance. Length of need lifetime. Accessories: elevating leg rests (ELRs), wheel locks, extensions and anti-tippers.  Seat and back cushions   10/11/20 1545          Time coordinating discharge: 35 minutes  The results of significant diagnostics from this hospitalization (including imaging, microbiology, ancillary and laboratory) are listed below for reference.    Procedures and Diagnostic Studies:   DG Abd 2 Views  Result Date: 10/09/2020 CLINICAL  DATA:  Abdominal pain for several days. Nausea and vomiting. EXAM: ABDOMEN - 2 VIEW COMPARISON:  Acute abdominal series scratched at CT of the abdomen pelvis 09/19/2020 FINDINGS: The lobe airspace opacity with fiducial markers again noted. Lung bases are otherwise clear. Bowel gas pattern is normal. Vascular calcifications are evident. IMPRESSION: Acute or abnormality to explain patient's abdominal pain. Electronically Signed   By: San Morelle M.D.   On: 10/09/2020 11:03     Labs:   Basic Metabolic Panel: Recent Labs  Lab 10/09/20 0426 10/09/20 0924 10/10/20 0438 10/11/20 0317 10/12/20 0301 10/13/20 0311 10/14/20 0431  NA  --    < > 135 134* 133* 131* 135  K  --    < > 2.9* 3.3* 3.2* 3.1* 3.1*  CL  --    < > 98 101 102 103 104  CO2  --    < >  23 23 19* 19* 19*  GLUCOSE  --    < > 105* 108* 210* 271* 113*  BUN  --    < > 78* 67* 63* 58* 49*  CREATININE  --    < > 3.66* 4.12* 3.67* 3.48* 3.01*  CALCIUM  --    < > 9.0 8.5* 8.3* 8.0* 8.3*  MG 2.8*  --   --   --   --   --  1.6*   < > = values in this interval not displayed.   GFR Estimated Creatinine Clearance: 15.3 mL/min (A) (by C-G formula based on SCr of 3.01 mg/dL (H)). Liver Function Tests: Recent Labs  Lab 10/08/20 2030 10/10/20 0438 10/11/20 0317 10/12/20 0301 10/13/20 0311  AST 32 25 22 17 15   ALT 35 23 20 20 15   ALKPHOS 51 41 37* 33* 33*  BILITOT 0.5 0.9 0.7 0.7 0.7  PROT 6.9 5.5* 5.0* 4.6* 4.6*  ALBUMIN 3.3* 2.6* 2.3* 2.1* 2.1*   Recent Labs  Lab 10/08/20 2030  LIPASE 32   No results for input(s): AMMONIA in the last 168 hours. Coagulation profile Recent Labs  Lab 10/09/20 1635 10/10/20 0438 10/11/20 0317 10/12/20 0301 10/14/20 0431  INR 2.6* 1.8* 1.5* 1.7* 2.1*    CBC: Recent Labs  Lab 10/09/20 0924 10/10/20 0438 10/11/20 0317 10/12/20 0301 10/13/20 0311  WBC 13.1* 12.8* 9.9 7.8 7.2  NEUTROABS  --   --  7.8* 5.7 5.6  HGB 12.7 11.7* 10.7* 10.1* 9.3*  HCT 37.2 34.0* 30.0* 30.6*  28.6*  MCV 84.9 85.6 86.0 88.7 90.5  PLT 427* 401* 318 294 265   Cardiac Enzymes: No results for input(s): CKTOTAL, CKMB, CKMBINDEX, TROPONINI in the last 168 hours. BNP: Invalid input(s): POCBNP CBG: Recent Labs  Lab 10/13/20 1115 10/13/20 1651 10/13/20 2130 10/14/20 0624 10/14/20 0702  GLUCAP 202* 208* 223* 61* 109*   D-Dimer No results for input(s): DDIMER in the last 72 hours. Hgb A1c No results for input(s): HGBA1C in the last 72 hours. Lipid Profile No results for input(s): CHOL, HDL, LDLCALC, TRIG, CHOLHDL, LDLDIRECT in the last 72 hours. Thyroid function studies No results for input(s): TSH, T4TOTAL, T3FREE, THYROIDAB in the last 72 hours.  Invalid input(s): FREET3 Anemia work up No results for input(s): VITAMINB12, FOLATE, FERRITIN, TIBC, IRON, RETICCTPCT in the last 72 hours. Microbiology Recent Results (from the past 240 hour(s))  Urine culture     Status: None   Collection Time: 10/09/20  6:44 AM   Specimen: Urine, Random  Result Value Ref Range Status   Specimen Description URINE, RANDOM  Final   Special Requests NONE  Final   Culture   Final    NO GROWTH Performed at [facility], [address] [place], [place] [address]    Report Status 10/10/2020 FINAL  Final  Resp Panel by RT-PCR (Flu A&B, Covid) Urine, Catheterized     Status: None   Collection Time: 10/09/20  6:45 AM   Specimen: Urine, Catheterized; Nasopharyngeal(NP) swabs in vial transport medium  Result Value Ref Range Status   SARS Coronavirus 2 by RT PCR NEGATIVE NEGATIVE Final    Comment: (NOTE) SARS-CoV-2 target nucleic acids are NOT DETECTED.  The SARS-CoV-2 RNA is generally detectable in upper respiratory specimens during the acute phase of infection. The lowest concentration of SARS-CoV-2 viral copies this assay can detect is 138 copies/mL. A negative result does not preclude SARS-Cov-2 infection and should not be used as the sole basis for  treatment or other patient  management decisions. A negative result may occur with  improper specimen collection/handling, submission of specimen other than nasopharyngeal swab, presence of viral mutation(s) within the areas targeted by this assay, and inadequate number of viral copies(<138 copies/mL). A negative result must be combined with clinical observations, patient history, and epidemiological information. The expected result is Negative.  Fact Sheet for Patients:  EntrepreneurPulse.com.au  Fact Sheet for Healthcare Providers:  IncredibleEmployment.be  This test is no t yet approved or cleared by the [place] FDA and  has been authorized for detection and/or diagnosis of SARS-CoV-2 by FDA under an Emergency Use Authorization (EUA). This EUA will remain  in effect (meaning this test can be used) for the duration of the COVID-19 declaration under Section 564(b)(1) of the Act, 21 U.S.C.section 360bbb-3(b)(1), unless the authorization is terminated  or revoked sooner.       Influenza A by PCR NEGATIVE NEGATIVE Final   Influenza B by PCR NEGATIVE NEGATIVE Final    Comment: (NOTE) The Xpert Xpress SARS-CoV-2/FLU/RSV plus assay is intended as an aid in the diagnosis of influenza from Nasopharyngeal swab specimens and should not be used as a sole basis for treatment. Nasal washings and aspirates are unacceptable for Xpert Xpress SARS-CoV-2/FLU/RSV testing.  Fact Sheet for Patients: EntrepreneurPulse.com.au  Fact Sheet for Healthcare Providers: IncredibleEmployment.be  This test is not yet approved or cleared by the [place] FDA and has been authorized for detection and/or diagnosis of SARS-CoV-2 by FDA under an Emergency Use Authorization (EUA). This EUA will remain in effect (meaning this test can be used) for the duration of the COVID-19 declaration under Section 564(b)(1) of the Act, 21 U.S.C. section 360bbb-3(b)(1),  unless the authorization is terminated or revoked.  Performed at [facility], [place] [address], [place], [place] [address]      Signed: Terrilee Croak  [facility] 10/14/2020, 10:20 AM

## 2020-10-14 NOTE — Progress Notes (Signed)
CBG @ 3353 was 61.  At bedtime, patient had eaten a full [place] sandwich and apple sauce.  I gave her an applesauce and one apple juice.  At 0703, her CBG was 109.  Will continue to monitor patient.  Earleen Reaper RN

## 2020-10-14 NOTE — Plan of Care (Signed)
  Problem: Health Behavior/Discharge Planning: Goal: Ability to manage health-related needs will improve 10/14/2020 1423 by Vira Agar, RN Outcome: Completed/Met 10/14/2020 1118 by Vira Agar, RN Outcome: Progressing   Problem: Clinical Measurements: Goal: Ability to maintain clinical measurements within normal limits will improve Outcome: Completed/Met Goal: Will remain free from infection Outcome: Completed/Met   Problem: Activity: Goal: Risk for activity intolerance will decrease 10/14/2020 1423 by Vira Agar, RN Outcome: Completed/Met 10/14/2020 1118 by Vira Agar, RN Outcome: Progressing   Problem: Coping: Goal: Level of anxiety will decrease 10/14/2020 1423 by Vira Agar, RN Outcome: Completed/Met 10/14/2020 1118 by Vira Agar, RN Outcome: Progressing   Problem: Elimination: Goal: Will not experience complications related to bowel motility 10/14/2020 1423 by Vira Agar, RN Outcome: Completed/Met 10/14/2020 1118 by Vira Agar, RN Outcome: Progressing Goal: Will not experience complications related to urinary retention Outcome: Completed/Met   Problem: Safety: Goal: Ability to remain free from injury will improve 10/14/2020 1423 by Vira Agar, RN Outcome: Completed/Met 10/14/2020 1118 by Vira Agar, RN Outcome: Progressing

## 2020-10-14 NOTE — Progress Notes (Addendum)
ANTICOAGULATION CONSULT NOTE - Follow Up Consult  Pharmacy Consult for Heparin Indication: h/o DVT, afib, and CVAs  Allergies  Allergen Reactions  . Sulfa Antibiotics Other (See Comments)    Pt states she had "extreme pain"  . Sulfacetamide Sodium Other (See Comments)    Pain    Patient Measurements: Height: 5' 3.5" (161.3 cm) Weight: 65.1 kg (143 lb 8.4 oz) IBW/kg (Calculated) : 53.55  Vital Signs: Temp: 98.8 F (37.1 C) (02/27 0518) Temp Source: Oral (02/27 0518) BP: 184/75 (02/27 0518) Pulse Rate: 82 (02/27 0518)  Labs: Recent Labs    10/12/20 0301 10/13/20 0311 10/14/20 0431  HGB 10.1* 9.3*  --   HCT 30.6* 28.6*  --   PLT 294 265  --   LABPROT 19.0*  --  22.6*  INR 1.7*  --  2.1*  CREATININE 3.67* 3.48* 3.01*    Estimated Creatinine Clearance: 15.3 mL/min (A) (by C-G formula based on SCr of 3.01 mg/dL (H)).   Assessment:  Anticoag: warfarin as PTA for hx RLE DVT(Eliquis 11/2019> chg to warf 03/2020 due to $$ Eliquis per 03/29/20 office note). Also hx CVA 1994 and 2000. INR up to 2.1. Hgb down to 9.3. Plts down from 588(baseline)>265. No heparin.  - 2/22: INR up 8.2 > Vit K 1mg  IV for potential EGD; no bleeding - 2/23: INR down 1.8, SCDs on >no EDG planned > warf resumed with 2.5 mg  PTA warfarin:  5 mg daily except 2.5 mg qMon (LD PTA 2/21). Admit INR 8.2. - last outpt INR 2.3 on 2/7 at [facility]  Goal of Therapy:  INR 2-3 Monitor platelets by anticoagulation protocol: Yes   Plan:  Warfarin 5mg  x 1 more day Daily PT/INR Kdur 20 BID to con't. Add Extra 50meq x 2 today. Add magnesium 2g IV x 1   Ashley [name], PharmD, BCPS Clinical Staff Pharmacist [email or web address] [place] [name], Ashley [name] 10/14/2020,7:14 AM

## 2020-10-14 NOTE — Progress Notes (Signed)
DISCHARGE NOTE HOME Ashley [name] to be discharged Home per MD order. Discussed prescriptions and follow up appointments with the patient and her husband. Prescriptions given to patient; medication list explained in detail. Patient and husband verbalized understanding.  Skin clean, dry and intact without evidence of skin break down, no evidence of skin tears noted. IV catheter discontinued intact. Site without signs and symptoms of complications. Dressing and pressure applied. Pt denies pain at the site currently. No complaints noted.  Patient free of lines, drains, and wounds.   An After Visit Summary (AVS) was printed and given to the patient. Patient escorted via wheelchair, and discharged home via private auto.  Vira Agar, RN

## 2020-10-17 ENCOUNTER — Telehealth: Payer: Self-pay | Admitting: *Deleted

## 2020-10-17 NOTE — Telephone Encounter (Signed)
I left a message for the patient to return my call.

## 2020-10-18 ENCOUNTER — Telehealth: Payer: Self-pay | Admitting: *Deleted

## 2020-10-18 NOTE — Telephone Encounter (Signed)
Called patient on 10/18/2020 , 10:15 AM in an attempt to reach the patient for a hospital follow up. Spoke with the patient, who sounded very happy and determined to improve her health.  Admit date: 10/08/20 Discharge: 10/14/20   She does not have any questions or concerns about medications from the hospital admission. The patient's medications were reviewed over the phone, they were counseled to bring in all current medications to the hospital follow up visit.   I advised the patient to call if any questions or concerns arise about the hospital admission or medications    Home health was started in the hospital. Patient has in home PT scheduled, but states she is getting around very well without the PT All questions were answered and a follow up appointment was made. Appointment with Dr Melford Aase on 10/25/2020.  Prior to Admission medications   Medication Sig Start Date End Date Taking? Authorizing Provider  acetaminophen (TYLENOL) 500 MG tablet Take 1,000 mg by mouth every 6 (six) hours as needed for mild pain or headache.    [provider]  amLODipine (NORVASC) 10 MG tablet Take 1 tablet (10 mg total) by mouth daily. 07/04/19   Cristal Deer, MD  aspirin EC 81 MG tablet Take 81 mg by mouth daily.    [provider]  atorvastatin (LIPITOR) 40 MG tablet TAKE 1 TABLET BY MOUTH EVERY DAY FOR CHOLESTEROL Patient taking differently: Take 40 mg by mouth daily. 09/25/20   Liane Comber, NP  azelastine (ASTELIN) 0.1 % nasal spray PLACE 2 SPRAYS INTO BOTH NOSTRILS 2 (TWO) TIMES DAILY. USE IN EACH NOSTRIL AS DIRECTED Patient taking differently: Place 2 sprays into both nostrils 2 (two) times daily as needed for allergies. 05/29/20   Unk Pinto, MD  Cholecalciferol (VITAMIN D3) 25 MCG (1000 UT) CAPS Take by mouth. Takes 2 to 3 capsules daily.    [provider]  clorazepate (TRANXENE-T) 7.5 MG tablet Take    1/2 to 1 tablet    1 or 2 x /day    ONLY if needed for   Nerves Patient taking differently: Take 7.5 mg by mouth 2 (two) times daily as needed for anxiety or sleep. 05/22/20   Unk Pinto, MD  famotidine (PEPCID) 20 MG tablet TAKE 1 TABLET TWICE A DAY WITH MEALS FOR INDIGESTION. Patient taking differently: Take 20 mg by mouth 2 (two) times daily. 11/30/19   Unk Pinto, MD  fenofibrate (TRICOR) 145 MG tablet TAKE 1 TABLET DAILY FOR TRIGLYCERIDES (BLOOD FATS) Patient taking differently: Take 145 mg by mouth daily. 08/22/20   Unk Pinto, MD  ferrous sulfate 325 (65 FE) MG tablet TAKE 1 TABLET 2 TIMES A DAY WITH A MEAL FOR IRON DEFICIENCY 10/10/20   Garnet Sierras, NP  FLUoxetine (PROZAC) 20 MG capsule Take 20 mg by mouth daily.    [provider]  hydrALAZINE (APRESOLINE) 100 MG tablet Take 1 tablet (100 mg total) by mouth every 8 (eight) hours. 10/14/20 01/12/21  Terrilee Croak, MD  levothyroxine (SYNTHROID) 88 MCG tablet Take   1 tablet    Daily   on an empty stomach with only water for 30 minutes & no Antacid meds, Calcium or Magnesium for 4 hours & avoid Biotin Patient taking differently: Take 88 mcg by mouth daily before breakfast. 09/05/20   Unk Pinto, MD  Multiple Vitamins-Minerals (MULTIVITAMIN WITH MINERALS) tablet Take 1 tablet by mouth daily.    [provider]  NOVOLIN N 100 UNIT/ML injection INJECT 50 UNITS EVERY MORNING  AND 25 UNITS EVERY EVENING Patient taking differently: Inject 25-50 Units into the skin See admin instructions. Injects 25-50 units into the skin after checking CBG daily (CBG 160-299 mg/dL: 25 units; CBG >300 mg/dl: 50 units); gives extra 25 units in the evening if taking a big meal 05/09/20   Unk Pinto, MD  [facility] ULTRA test strip TEST BLOOD SUGAR 3 TIMES A DAY 01/27/20   Vicie Mutters R, PA-C  polyethylene glycol (MIRALAX / GLYCOLAX) 17 g packet Take 17 g by mouth daily. Patient taking differently: Take 17 g by mouth daily as needed for mild constipation. 03/06/20   Vladimir Crofts,  PA-C  potassium chloride (KLOR-CON M10) 10 MEQ tablet TAKE 1 TABLET 3 X /DAY WITH MEALS FOR POTASSIUM Patient taking differently: Take 10 mEq by mouth 2 (two) times daily. 01/08/20   Unk Pinto, MD  vitamin C (ASCORBIC ACID) 500 MG tablet Take 500 mg by mouth daily. Take with iron/ferrous sulfate    [provider]  warfarin (COUMADIN) 5 MG tablet TAKE 1 TABLET BY MOUTH EVERY DAY Patient taking differently: Take 2.5-5 mg by mouth See admin instructions. Takes 1 tablet (5 mg totally) by mouth every day; Except 0.5 tablet (2.5 mg totally) on Monday 07/06/20   Hilty, Nadean Corwin, MD

## 2020-10-24 ENCOUNTER — Encounter: Payer: Self-pay | Admitting: Internal Medicine

## 2020-10-24 NOTE — Patient Instructions (Addendum)
Due to recent changes in healthcare laws, you may see the results of your imaging and laboratory studies on MyChart before your provider has had a chance to review them.  We understand that in some cases there may be results that are confusing or concerning to you. Not all laboratory results come back in the same time frame and the provider may be waiting for multiple results in order to interpret others.  Please give [place] 48 hours in order for your provider to thoroughly review all the results before contacting the office for clarification of your results.   +++++++++++++++++++++++++  Vit D  & Vit C 1,000 mg   are recommended to help protect  against the Covid-19 and other Corona viruses.    Also it's recommended  to take  Zinc 50 mg  to help  protect against the Covid-19   and best place to get  is also on [email or web address]  and don't pay more than 6-8 cents /pill !  ================================ Coronavirus (COVID-19) Are you at risk?  Are you at risk for the Coronavirus (COVID-19)?  To be considered HIGH RISK for Coronavirus (COVID-19), you have to meet the following criteria:  . Traveled to [place], [place], [place], [place] or [place]; or in the [place] to [place], [place], [place]  . or [place]; and have fever, cough, and shortness of breath within the last 2 weeks of travel OR . Been in close contact with a person diagnosed with COVID-19 within the last 2 weeks and have  . fever, cough,and shortness of breath .  . IF YOU DO NOT MEET THESE CRITERIA, YOU ARE CONSIDERED LOW RISK FOR COVID-19.  What to do if you are HIGH RISK for COVID-19?  Marland Kitchen If you are having a medical emergency, call 911. . Seek medical care right away. Before you go to a doctor's office, urgent care or emergency department, .  call ahead and tell them about your recent travel, contact with someone diagnosed with COVID-19  .  and your symptoms.  . You should receive instructions from your physician's  office regarding next steps of care.  . When you arrive at healthcare provider, tell the healthcare staff immediately you have returned from  . visiting [place], [place], [place], [place] or [place]; or traveled in the [place] to [place], [place],  . [place] or [place] in the last two weeks or you have been in close contact with a person diagnosed with  . COVID-19 in the last 2 weeks.   . Tell the health care staff about your symptoms: fever, cough and shortness of breath. . After you have been seen by a medical provider, you will be either: o Tested for (COVID-19) and discharged home on quarantine except to seek medical care if  o symptoms worsen, and asked to  - Stay home and avoid contact with others until you get your results (4-5 days)  - Avoid travel on public transportation if possible (such as bus, train, or airplane) or o Sent to the Emergency Department by EMS for evaluation, COVID-19 testing  and  o possible admission depending on your condition and test results.  What to do if you are LOW RISK for COVID-19?  Reduce your risk of any infection by using the same precautions used for avoiding the common cold or flu:  Marland Kitchen Wash your hands often with soap and warm water for at least 20 seconds.  If soap and water are not readily  available,  . use an alcohol-based hand sanitizer with at least 60% alcohol.  . If coughing or sneezing, cover your mouth and nose by coughing or sneezing into the elbow areas of your shirt or coat, .  into a tissue or into your sleeve (not your hands). . Avoid shaking hands with others and consider head nods or verbal greetings only. . Avoid touching your eyes, nose, or mouth with unwashed hands.  . Avoid close contact with people who are sick. . Avoid places or events with large numbers of people in one location, like concerts or sporting events. . Carefully consider travel plans you have or are making. . If you are planning any travel outside or  inside the [place], visit the CDC's Travelers' Health webpage for the latest health notices. . If you have some symptoms but not all symptoms, continue to monitor at home and seek medical attention  . if your symptoms worsen. . If you are having a medical emergency, call 911.   . >>>>>>>>>>>>>>>>>>>>>>>>>>>>>>>>> . We Do NOT Approve of  [facility], [employer]  Our Patients  To Do Home Visits & We Do NOT Approve of [employer] > > > > > > > > > > > > > > > > > > > > > > > > > > > > > > > > > > > > > > >  Preventive Care for Adults  A healthy lifestyle and preventive care can promote health and wellness. Preventive health guidelines for women include the following key practices.  A routine yearly physical is a good way to check with your health care provider about your health and preventive screening. It is a chance to share any concerns and updates on your health and to receive a thorough exam.  Visit your dentist for a routine exam and preventive care every 6 months. Brush your teeth twice a day and floss once a day. Good oral hygiene prevents tooth decay and gum disease.  The frequency of eye exams is based on your age, health, family medical history, use of contact lenses, and other factors. Follow your health care provider's recommendations for frequency of eye exams.  Eat a healthy diet. Foods like vegetables, fruits, whole grains, low-fat dairy products, and lean protein foods contain the nutrients you need without too many calories. Decrease your intake of foods high in solid fats, added sugars, and salt. Eat the right amount of calories for you. Get information about a proper diet from your health care provider, if necessary.  Regular physical exercise is one of the most important things you can do for your health. Most adults should get at least 150 minutes of moderate-intensity exercise (any activity that increases your heart rate and causes you to sweat) each  week. In addition, most adults need muscle-strengthening exercises on 2 or more days a week.  Maintain a healthy weight. The body mass index (BMI) is a screening tool to identify possible weight problems. It provides an estimate of body fat based on height and weight. Your health care provider can find your BMI and can help you achieve or maintain a healthy weight. For adults 20 years and older:  A BMI below 18.5 is considered underweight.  A BMI of 18.5 to 24.9 is normal.  A BMI of 25 to 29.9 is considered overweight.  A BMI of 30 and above is considered obese.  Maintain normal blood lipids and cholesterol levels by exercising and minimizing your intake of  saturated fat. Eat a balanced diet with plenty of fruit and vegetables. If your lipid or cholesterol levels are high, you are over 50, or you are at high risk for heart disease, you may need your cholesterol levels checked more frequently. Ongoing high lipid and cholesterol levels should be treated with medicines if diet and exercise are not working.  If you smoke, find out from your health care provider how to quit. If you do not use tobacco, do not start.  Lung cancer screening is recommended for adults aged 78-80 years who are at high risk for developing lung cancer because of a history of smoking. A yearly low-dose CT scan of the lungs is recommended for people who have at least a 30-pack-year history of smoking and are a current smoker or have quit within the past 15 years. A pack year of smoking is smoking an average of 1 pack of cigarettes a day for 1 year (for example: 1 pack a day for 30 years or 2 packs a day for 15 years). Yearly screening should continue until the smoker has stopped smoking for at least 15 years. Yearly screening should be stopped for people who develop a health problem that would prevent them from having lung cancer treatment.  Avoid use of street drugs. Do not share needles with anyone. Ask for help if you need  support or instructions about stopping the use of drugs.  High blood pressure causes heart disease and increases the risk of stroke.  Ongoing high blood pressure should be treated with medicines if weight loss and exercise do not work.  If you are 14-45 years old, ask your health care provider if you should take aspirin to prevent strokes.  Diabetes screening involves taking a blood sample to check your fasting blood sugar level. This should be done once every 3 years, after age 40, if you are within normal weight and without risk factors for diabetes. Testing should be considered at a younger age or be carried out more frequently if you are overweight and have at least 1 risk factor for diabetes.  Breast cancer screening is essential preventive care for women. You should practice "breast self-awareness." This means understanding the normal appearance and feel of your breasts and may include breast self-examination. Any changes detected, no matter how small, should be reported to a health care provider. Women in their 18s and 30s should have a clinical breast exam (CBE) by a health care provider as part of a regular health exam every 1 to 3 years. After age 71, women should have a CBE every year. Starting at age 39, women should consider having a mammogram (breast X-ray test) every year. Women who have a family history of breast cancer should talk to their health care provider about genetic screening. Women at a high risk of breast cancer should talk to their health care providers about having an MRI and a mammogram every year.  Breast cancer gene (BRCA)-related cancer risk assessment is recommended for women who have family members with BRCA-related cancers. BRCA-related cancers include breast, ovarian, tubal, and peritoneal cancers. Having family members with these cancers may be associated with an increased risk for harmful changes (mutations) in the breast cancer genes BRCA1 and BRCA2. Results of the  assessment will determine the need for genetic counseling and BRCA1 and BRCA2 testing.  Routine pelvic exams to screen for cancer are no longer recommended for nonpregnant women who are considered low risk for cancer of the pelvic organs (ovaries,  uterus, and vagina) and who do not have symptoms. Ask your health care provider if a screening pelvic exam is right for you.  If you have had past treatment for cervical cancer or a condition that could lead to cancer, you need Pap tests and screening for cancer for at least 20 years after your treatment. If Pap tests have been discontinued, your risk factors (such as having a new sexual partner) need to be reassessed to determine if screening should be resumed. Some women have medical problems that increase the chance of getting cervical cancer. In these cases, your health care provider may recommend more frequent screening and Pap tests.    Colorectal cancer can be detected and often prevented. Most routine colorectal cancer screening begins at the age of 62 years and continues through age 28 years. However, your health care provider may recommend screening at an earlier age if you have risk factors for colon cancer. On a yearly basis, your health care provider may provide home test kits to check for hidden blood in the stool. Use of a small camera at the end of a tube, to directly examine the colon (sigmoidoscopy or colonoscopy), can detect the earliest forms of colorectal cancer. Talk to your health care provider about this at age 23, when routine screening begins.  Direct exam of the colon should be repeated every 5-10 years through age 31 years, unless early forms of pre-cancerous polyps or small growths are found.  Osteoporosis is a disease in which the bones lose minerals and strength with aging. This can result in serious bone fractures or breaks. The risk of osteoporosis can be identified using a bone density scan. Women ages 83 years and over and women  at risk for fractures or osteoporosis should discuss screening with their health care providers. Ask your health care provider whether you should take a calcium supplement or vitamin D to reduce the rate of osteoporosis.  Menopause can be associated with physical symptoms and risks. Hormone replacement therapy is available to decrease symptoms and risks. You should talk to your health care provider about whether hormone replacement therapy is right for you.  Use sunscreen. Apply sunscreen liberally and repeatedly throughout the day. You should seek shade when your shadow is shorter than you. Protect yourself by wearing long sleeves, pants, a wide-brimmed hat, and sunglasses year round, whenever you are outdoors.  Once a month, do a whole body skin exam, using a mirror to look at the skin on your back. Tell your health care provider of new moles, moles that have irregular borders, moles that are larger than a pencil eraser, or moles that have changed in shape or color.  Stay current with required vaccines (immunizations).  Influenza vaccine. All adults should be immunized every year.  Tetanus, diphtheria, and acellular pertussis (Td, Tdap) vaccine. Pregnant women should receive 1 dose of Tdap vaccine during each pregnancy. The dose should be obtained regardless of the length of time since the last dose. Immunization is preferred during the 27th-36th week of gestation. An adult who has not previously received Tdap or who does not know her vaccine status should receive 1 dose of Tdap. This initial dose should be followed by tetanus and diphtheria toxoids (Td) booster doses every 10 years. Adults with an unknown or incomplete history of completing a 3-dose immunization series with Td-containing vaccines should begin or complete a primary immunization series including a Tdap dose. Adults should receive a Td booster every 10 years.  Zoster vaccine. One dose is recommended for adults aged 64 years or  older unless certain conditions are present.    Pneumococcal 13-valent conjugate (PCV13) vaccine. When indicated, a person who is uncertain of her immunization history and has no record of immunization should receive the PCV13 vaccine. An adult aged 61 years or older who has certain medical conditions and has not been previously immunized should receive 1 dose of PCV13 vaccine. This PCV13 should be followed with a dose of pneumococcal polysaccharide (PPSV23) vaccine. The PPSV23 vaccine dose should be obtained at least 1 or more year(s) after the dose of PCV13 vaccine. An adult aged 46 years or older who has certain medical conditions and previously received 1 or more doses of PPSV23 vaccine should receive 1 dose of PCV13. The PCV13 vaccine dose should be obtained 1 or more years after the last PPSV23 vaccine dose.    Pneumococcal polysaccharide (PPSV23) vaccine. When PCV13 is also indicated, PCV13 should be obtained first. All adults aged 58 years and older should be immunized. An adult younger than age 1 years who has certain medical conditions should be immunized. Any person who resides in a nursing home or long-term care facility should be immunized. An adult smoker should be immunized. People with an immunocompromised condition and certain other conditions should receive both PCV13 and PPSV23 vaccines. People with human immunodeficiency virus (HIV) infection should be immunized as soon as possible after diagnosis. Immunization during chemotherapy or radiation therapy should be avoided. Routine use of PPSV23 vaccine is not recommended for American Indians, [place] Natives, or people younger than 65 years unless there are medical conditions that require PPSV23 vaccine. When indicated, people who have unknown immunization and have no record of immunization should receive PPSV23 vaccine. One-time revaccination 5 years after the first dose of PPSV23 is recommended for people aged 19-64 years who have chronic  kidney failure, nephrotic syndrome, asplenia, or immunocompromised conditions. People who received 1-2 doses of PPSV23 before age 11 years should receive another dose of PPSV23 vaccine at age 70 years or later if at least 5 years have passed since the previous dose. Doses of PPSV23 are not needed for people immunized with PPSV23 at or after age 65 years.   Preventive Services / Frequency  Ages 56 years and over  Blood pressure check.  Lipid and cholesterol check.  Lung cancer screening. / Every year if you are aged 16-80 years and have a 30-pack-year history of smoking and currently smoke or have quit within the past 15 years. Yearly screening is stopped once you have quit smoking for at least 15 years or develop a health problem that would prevent you from having lung cancer treatment.  Clinical breast exam.** / Every year after age 59 years.   BRCA-related cancer risk assessment.** / For women who have family members with a BRCA-related cancer (breast, ovarian, tubal, or peritoneal cancers).  Mammogram.** / Every year beginning at age 70 years and continuing for as long as you are in good health. Consult with your health care provider.  Pap test.** / Every 3 years starting at age 32 years through age 28 or 9 years with 3 consecutive normal Pap tests. Testing can be stopped between 65 and 70 years with 3 consecutive normal Pap tests and no abnormal Pap or HPV tests in the past 10 years.  Fecal occult blood test (FOBT) of stool. / Every year beginning at age 57 years and continuing until age 29 years. You may not need to  do this test if you get a colonoscopy every 10 years.  Flexible sigmoidoscopy or colonoscopy.** / Every 5 years for a flexible sigmoidoscopy or every 10 years for a colonoscopy beginning at age 21 years and continuing until age 56 years.  Hepatitis C blood test.** / For all people born from [birth year] through 1964 and any individual with known risks for hepatitis  C.  Osteoporosis screening.** / A one-time screening for women ages 53 years and over and women at risk for fractures or osteoporosis.  Skin self-exam. / Monthly.  Influenza vaccine. / Every year.  Tetanus, diphtheria, and acellular pertussis (Tdap/Td) vaccine.** / 1 dose of Td every 10 years.  Zoster vaccine.** / 1 dose for adults aged 32 years or older.  Pneumococcal 13-valent conjugate (PCV13) vaccine.** / Consult your health care provider.  Pneumococcal polysaccharide (PPSV23) vaccine.** / 1 dose for all adults aged 67 years and older. Screening for abdominal aortic aneurysm (AAA)  by ultrasound is recommended for people who have history of high blood pressure or who are current or former smokers. ++++++++++++++++++++ Recommend Adult Low Dose Aspirin or  coated  Aspirin 81 mg daily  To reduce risk of Colon Cancer 40 %,  Skin Cancer 26 % ,  Melanoma 46%  and  Pancreatic cancer 60% ++++++++++++++++++++ Vitamin D goal  is between 70-100.  Please make sure that you are taking your Vitamin D as directed.  It is very important as a natural anti-inflammatory  helping hair, skin, and nails, as well as reducing stroke and heart attack risk.  It helps your bones and helps with mood. It also decreases numerous cancer risks so please take it as directed.  Low Vit D is associated with a 200-300% higher risk for CANCER  and 200-300% higher risk for HEART   ATTACK  &  STROKE.   .....................................Marland Kitchen It is also associated with higher death rate at younger ages,  autoimmune diseases like Rheumatoid arthritis, Lupus, Multiple Sclerosis.    Also many other serious conditions, like depression, Alzheimer's Dementia, infertility, muscle aches, fatigue, fibromyalgia - just to name a few. ++++++++++++++++++ Recommend the book "The END of DIETING" by Dr Excell Seltzer  & the book "The END of DIABETES " by Dr Excell Seltzer At [email or web address] - get book & Audio CD's    Being diabetic has  a  300% increased risk for heart attack, stroke, cancer, and alzheimer- type vascular dementia. It is very important that you work harder with diet by avoiding all foods that are white. Avoid white rice (brown & wild rice is OK), white potatoes (sweetpotatoes in moderation is OK), White bread or wheat bread or anything made out of white flour like bagels, donuts, rolls, buns, biscuits, cakes, pastries, cookies, pizza crust, and pasta (made from white flour & egg whites) - vegetarian pasta or spinach or wheat pasta is OK. Multigrain breads like Arnold's or [employer], or multigrain sandwich thins or flatbreads.  Diet, exercise and weight loss can reverse and cure diabetes in the early stages.  Diet, exercise and weight loss is very important in the control and prevention of complications of diabetes which affects every system in your body, ie. Brain - dementia/stroke, eyes - glaucoma/blindness, heart - heart attack/heart failure, kidneys - dialysis, stomach - gastric paralysis, intestines - malabsorption, nerves - severe painful neuritis, circulation - gangrene & loss of a leg(s), and finally cancer and Alzheimers.    I recommend avoid fried & greasy foods,  sweets/candy, white rice (brown or wild  rice or Quinoa is OK), white potatoes (sweet potatoes are OK) - anything made from white flour - bagels, doughnuts, rolls, buns, biscuits,white and wheat breads, pizza crust and traditional pasta made of white flour & egg white(vegetarian pasta or spinach or wheat pasta is OK).  Multi-grain bread is OK - like multi-grain flat bread or sandwich thins. Avoid alcohol in excess. Exercise is also important.    Eat all the vegetables you want - avoid meat, especially red meat and dairy - especially cheese.  Cheese is the most concentrated form of trans-fats which is the worst thing to clog up our arteries. Veggie cheese is OK which can be found in the fresh produce section at [employer] or [employer] or  [employer]  +++++++++++++++++++ DASH Eating Plan  DASH stands for "Dietary Approaches to Stop Hypertension."   The DASH eating plan is a healthy eating plan that has been shown to reduce high blood pressure (hypertension). Additional health benefits may include reducing the risk of type 2 diabetes mellitus, heart disease, and stroke. The DASH eating plan may also help with weight loss. WHAT DO I NEED TO KNOW ABOUT THE DASH EATING PLAN? For the DASH eating plan, you will follow these general guidelines:  Choose foods with a percent daily value for sodium of less than 5% (as listed on the food label).  Use salt-free seasonings or herbs instead of table salt or sea salt.  Check with your health care provider or pharmacist before using salt substitutes.  Eat lower-sodium products, often labeled as "lower sodium" or "no salt added."  Eat fresh foods.  Eat more vegetables, fruits, and low-fat dairy products.  Choose whole grains. Look for the word "whole" as the first word in the ingredient list.  Choose fish   Limit sweets, desserts, sugars, and sugary drinks.  Choose heart-healthy fats.  Eat veggie cheese   Eat more home-cooked food and less restaurant, buffet, and fast food.  Limit fried foods.  Cook foods using methods other than frying.  Limit canned vegetables. If you do use them, rinse them well to decrease the sodium.  When eating at a restaurant, ask that your food be prepared with less salt, or no salt if possible.                      WHAT FOODS CAN I EAT? Read Dr Fara Olden Fuhrman's books on The End of Dieting & The End of Diabetes  Grains Whole grain or whole wheat bread. Brown rice. Whole grain or whole wheat pasta. Quinoa, bulgur, and whole grain cereals. Low-sodium cereals. Corn or whole wheat flour tortillas. Whole grain cornbread. Whole grain crackers. Low-sodium crackers.  Vegetables Fresh or frozen vegetables (raw, steamed, roasted, or grilled). Low-sodium or  reduced-sodium tomato and vegetable juices. Low-sodium or reduced-sodium tomato sauce and paste. Low-sodium or reduced-sodium canned vegetables.   Fruits All fresh, canned (in natural juice), or frozen fruits.  Protein Products  All fish and seafood.  Dried beans, peas, or lentils. Unsalted nuts and seeds. Unsalted canned beans.  Dairy Low-fat dairy products, such as skim or 1% milk, 2% or reduced-fat cheeses, low-fat ricotta or cottage cheese, or plain low-fat yogurt. Low-sodium or reduced-sodium cheeses.  Fats and Oils Tub margarines without trans fats. Light or reduced-fat mayonnaise and salad dressings (reduced sodium). Avocado. Safflower, olive, or canola oils. Natural peanut or almond butter.  Other Unsalted popcorn and pretzels. The items listed above may not be a complete list of recommended foods  or beverages. Contact your dietitian for more options.  +++++++++++++++  WHAT FOODS ARE NOT RECOMMENDED? Grains/ White flour or wheat flour White bread. White pasta. White rice. Refined cornbread. Bagels and croissants. Crackers that contain trans fat.  Vegetables  Creamed or fried vegetables. Vegetables in a . Regular canned vegetables. Regular canned tomato sauce and paste. Regular tomato and vegetable juices.  Fruits Dried fruits. Canned fruit in light or heavy syrup. Fruit juice.  Meat and Other Protein Products Meat in general - RED meat & White meat.  Fatty cuts of meat. Ribs, chicken wings, all processed meats as bacon, sausage, bologna, salami, fatback, hot dogs, bratwurst and packaged luncheon meats.  Dairy Whole or 2% milk, cream, half-and-half, and cream cheese. Whole-fat or sweetened yogurt. Full-fat cheeses or blue cheese. Non-dairy creamers and whipped toppings. Processed cheese, cheese spreads, or cheese curds.  Condiments Onion and garlic salt, seasoned salt, table salt, and sea salt. Canned and packaged gravies. Worcestershire sauce. Tartar sauce. Barbecue  sauce. Teriyaki sauce. Soy sauce, including reduced sodium. Steak sauce. Fish sauce. Oyster sauce. Cocktail sauce. Horseradish. Ketchup and mustard. Meat flavorings and tenderizers. Bouillon cubes. Hot sauce. Tabasco sauce. Marinades. Taco seasonings. Relishes.  Fats and Oils Butter, stick margarine, lard, shortening and bacon fat. Coconut, palm kernel, or palm oils. Regular salad dressings.  Pickles and olives. Salted popcorn and pretzels.  The items listed above may not be a complete list of foods and beverages to avoid.  ++++++++++++++++++++++++++++  Chronic Kidney Disease, Adult  Chronic kidney disease (CKD) occurs when the kidneys are slowly and permanently damaged over a long period of time. The kidneys are a pair of organs that do many important jobs in the body, including:  Removing waste and extra fluid from the blood to make urine.  Making hormones that maintain the amount of fluid in tissues and blood vessels.  Maintaining the right amount of fluids and chemicals in the body. A small amount of kidney damage may not cause problems, but a large amount of damage may make it hard or impossible for the kidneys to work right. Steps must be taken to slow kidney damage or to stop it from getting worse. If steps are not taken, the kidneys may stop working permanently (end-stage renal disease, or ESRD). Most of the time, CKD does not go away, but it can often be controlled. People who have CKD are usually able to live full lives. What are the causes? The most common causes of this condition are diabetes and high blood pressure (hypertension). Other causes include:  Cardiovascular diseases. These affect the heart and blood vessels.  Kidney diseases. These include: ? Glomerulonephritis, or inflammation of the tiny filters in the kidneys. ? Interstitial nephritis. This is swelling of the small tubes of the kidneys and of the surrounding structures. ? Polycystic kidney disease, in which  clusters of fluid-filled sacs form within the kidneys. ? Renal vascular disease. This includes disorders that affect the arteries and veins of the kidneys.  Diseases that affect the body's defense system (immune system).  A problem with urine flow. This may be caused by: ? Kidney stones. ? Cancer. ? An enlarged prostate, in males.  A kidney infection or urinary tract infection (UTI) that keeps coming back.  Vasculitis. This is swelling or inflammation of the blood vessels. What increases the risk? Your chances of having kidney disease increase with age. The following factors may make you more likely to develop this condition:  A family history of kidney disease  or kidney failure. Kidney failure means the kidneys can no longer work right.  Certain genetic diseases.  Taking medicines often that are damaging to the kidneys.  Being around or being in contact with toxic substances.  Obesity.  A history of tobacco use. What are the signs or symptoms? Symptoms of this condition include:  Feeling very tired (lethargic) and having less energy.  Swelling, or edema, of the face, legs, ankles, or feet.  Nausea or vomiting, or loss of appetite.  Confusion or trouble concentrating.  Muscle twitches and cramps, especially in the legs.  Dry, itchy skin.  A metallic taste in the mouth.  Producing less urine, or producing more urine (especially at night).  Shortness of breath.  Trouble sleeping. CKD may also result in not having enough red blood cells or hemoglobin in the blood (anemia) or having weak bones (bone disease). Symptoms develop slowly and may not be obvious until the kidney damage becomes severe. It is possible to have kidney disease for years without having symptoms. How is this diagnosed? This condition may be diagnosed based on:  Blood tests.  Urine tests.  Imaging tests, such as an ultrasound or a CT scan.  A kidney biopsy. This involves removing a sample of  kidney tissue to be looked at under a microscope. Results from these tests will help to determine how serious the CKD is. How is this treated? There is no cure for most cases of this condition, but treatment usually relieves symptoms and prevents or slows the worsening of the disease. Treatment may include:  Diet changes, which may require you to avoid alcohol and foods that are high in salt, potassium, phosphorous, and protein.  Medicines. These may: ? Lower blood pressure. ? Control blood sugar (glucose). ? Relieve anemia. ? Relieve swelling. ? Protect your bones. ? Improve the balance of salts and minerals in your blood (electrolytes).  Dialysis, which is a type of treatment that removes toxic waste from the body. It may be needed if you have kidney failure.  Managing any other conditions that are causing your CKD or making it worse. Follow these instructions at home: Medicines  Take over-the-counter and prescription medicines only as told by your health care provider. The amount of some medicines that you take may need to be changed.  Do not take any new medicines unless approved by your health care provider. Many medicines can make kidney damage worse.  Do not take any vitamin and mineral supplements unless approved by your health care provider. Many nutritional supplements can make kidney damage worse. Lifestyle  Do not use any products that contain nicotine or tobacco, such as cigarettes, e-cigarettes, and chewing tobacco. If you need help quitting, ask your health care provider.  If you drink alcohol: ? Limit how much you use to:  0-1 drink a day for women who are not pregnant.  0-2 drinks a day for men. ? Know how much alcohol is in your drink. In the [place] one drink equals one 12 oz bottle of beer (355 mL), one 5 oz glass of wine (148 mL), or one 1 oz glass of hard liquor (44 mL).  Maintain a healthy weight. If you need help, ask your health care provider.  General  instructions  Follow instructions from your health care provider about eating or drinking restrictions, including any prescribed diet.  Track your blood pressure at home. Report changes in your blood pressure as told.  If you are being treated for diabetes, track your  blood glucose levels as told.  Start or continue an exercise plan. Exercise at least 30 minutes a day, 5 days a week.  Keep your immunizations up to date as told.  Keep all follow-up visits. This is important.  Where to find more information  American Association of Kidney Patients: BombTimer.gl  National Kidney Foundation: www.kidney.[place]: https://mathis.com/  Life Options: www.lifeoptions.org  [facility]ney [employer]: www.kidneyschool.org Contact a health care provider if:  Your symptoms get worse.  You develop new symptoms. Get help right away if:  You develop symptoms of ESRD. These include: ? Headaches. ? Numbness in your hands or feet. ? Easy bruising. ? Frequent hiccups. ? Chest pain. ? Shortness of breath. ? Lack of menstrual periods, in women.  You have a fever.  You are producing less urine than usual.  You have pain or bleeding when you urinate or when you have a bowel movement. These symptoms may represent a serious problem that is an emergency. Do not wait to see if the symptoms will go away. Get medical help right away. Call your local emergency services (911 in the [place] Do not drive yourself to the hospital. Summary  Chronic kidney disease (CKD) occurs when the kidneys become damaged slowly over a long period of time.  The most common causes of this condition are diabetes and high blood pressure (hypertension).  There is no cure for most cases of CKD, but treatment usually relieves symptoms and prevents or slows the worsening of the disease. Treatment may include a combination of lifestyle changes, medicines, and dialysis. This information is not intended to replace advice  given to you by your health care provider. Make sure you discuss any questions you have with your health care provider. Document Revised: 11/09/2019 Document Reviewed: 11/09/2019 Elsevier Patient Education  2021 [employer].  ++++++++++++++++++++++++++++++  End-Stage Kidney Disease  End-stage kidney disease occurs when the kidneys are so damaged that they cannot function and cannot get better. This condition may also be referred to as end-stage renal disease or ESRD. The kidneys are two organs that do many important jobs in the body, including:  Removing waste and extra fluid from the blood to make urine.  Making hormones that maintain the amount of fluid in tissues and blood vessels.  Maintaining the right amount of fluids and chemicals in the body. Without functioning kidneys, toxins build up in the blood, and life-threatening complications can occur. What are the causes? This condition usually occurs when a long-term (chronic) kidney disease gets worse and results in permanent damage to the kidneys. It may also be caused by sudden damage to the kidneys (acute kidney injury). What increases the risk? The following factors may make you more likely to develop this condition:  Having a family history of chronic kidney disease (CKD).  Chronic medical conditions that affect the kidneys, such as: ? Cardiovascular disease, including high blood pressure. ? Diabetes. ? Certain diseases that affect the body's disease-fighting system (immunesystem).  Smoking or a history of smoking.  Overuse of over-the-counter pain medicines.  Being around or being in contact with poisonous (toxic) substances. What are the signs or symptoms? Symptoms of this condition include:  Swelling of the face, legs, ankles, or feet.  Numbness, tingling, or loss of feeling in the hands or feet.  Tiredness (lethargy).  Nausea or vomiting.  Confusion, trouble concentrating, or loss of consciousness.  Chest  pain.  Shortness of breath.  Passing little or no urine.  Muscle twitches and cramps,  especially in the legs.  Skin changes, such as: ? Dry, itchy skin. ? Pale skin due to anemia, including the skin and tissue around the eye (conjunctiva). ? Abnormally dark or light skin.  Loss of appetite.  Headaches.  Decrease in muscle size (muscle wasting).  Easy bruising.  Stopping of menstrual periods, in women.  Jerky movements (seizures). How is this diagnosed? This condition may be diagnosed based on:  A physical exam, including blood pressure measurements.  Urine tests.  Blood tests.  Imaging tests.  A test in which a sample of tissue is removed from the kidneys to be examined under a microscope (kidney biopsy). How is this treated? This condition may be treated with:  Dialysis, which is a procedure that removes toxic wastes from the body. There are two types of dialysis: ? Dialysis that is done through your abdomen. This is called peritoneal dialysis and may be done several times a day. ? Dialysis that is done by a machine. This is called hemodialysis and may be done several times a week.  Kidney transplant. This is surgery to receive a new kidney. In addition to having dialysis or a kidney transplant, you may need to take medicines:  To control high blood pressure (hypertension).  To control high cholesterol.  To treat diabetes.  To maintain healthy levels of minerals in the blood (electrolytes). You may also be given a specific meal plan to follow that includes requirements or limits for:  Salt (sodium).  Protein.  Phosphorus.  Potassium.  Calcium. Follow these instructions at home: Medicines  Take over-the-counter and prescription medicines only as told by your health care provider.  Do not take any new medicines, vitamins, or mineral supplements unless approved by your health care provider. Many medicines and supplements can worsen kidney  damage.  Follow instructions from your health care provider about adjusting the doses of any medicines you take. Lifestyle  Do not use any products that contain nicotine or tobacco, such as cigarettes, e-cigarettes, and chewing tobacco. If you need help quitting, ask your health care provider.  Achieve and maintain a healthy weight. If you need help with this, ask your health care provider.  Start or continue an exercise plan. Exercise at least 30 minutes a day, 5 days a week.  Follow your prescribed meal plan. General instructions  Stay current with your shots (immunizations) as told by your health care provider.  Keep track of your blood pressure. Report changes in your blood pressure as told by your health care provider.  If you are being treated for diabetes, monitor and track your blood sugar (blood glucose) levels as told by your health care provider.  Keep all follow-up visits. This is important. Where to find more information  American Association of Kidney Patients: BombTimer.gl  National Kidney Foundation: www.kidney.[place]: https://mathis.com/  [facility]: www.lifeoptions.org  K[facility]ney [employer]: www.kidneyschool.org Contact a health care provider if:  Your symptoms get worse.  You develop new symptoms. Get help right away if:  You have weakness in an arm or leg on one side of your body.  You have difficulty speaking or you are slurring your speech.  You have a sudden change in your vision.  You have a sudden, severe headache.  You have a sudden weight increase.  You have difficulty breathing.  Your symptoms suddenly get worse.  You have chest pain. Summary  End-stage kidney disease occurs when the kidneys are so damaged that they cannot function and  cannot get better.  Without functioning kidneys, toxins build up in the blood and life-threatening complications can occur.  Treatment may include dialysis or a  kidney transplant along with medicines and lifestyle changes.
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
## 2020-10-25 ENCOUNTER — Ambulatory Visit (INDEPENDENT_AMBULATORY_CARE_PROVIDER_SITE_OTHER): Payer: HMO | Admitting: Internal Medicine

## 2020-10-25 ENCOUNTER — Other Ambulatory Visit: Payer: Self-pay

## 2020-10-25 ENCOUNTER — Encounter: Payer: Self-pay | Admitting: Internal Medicine

## 2020-10-25 VITALS — BP 128/80 | HR 81 | Temp 97.9°F | Resp 16 | Ht 63.5 in | Wt 160.4 lb

## 2020-10-25 DIAGNOSIS — E669 Obesity, unspecified: Secondary | ICD-10-CM

## 2020-10-25 DIAGNOSIS — F319 Bipolar disorder, unspecified: Secondary | ICD-10-CM

## 2020-10-25 DIAGNOSIS — E1165 Type 2 diabetes mellitus with hyperglycemia: Secondary | ICD-10-CM

## 2020-10-25 DIAGNOSIS — N179 Acute kidney failure, unspecified: Secondary | ICD-10-CM

## 2020-10-25 DIAGNOSIS — K5900 Constipation, unspecified: Secondary | ICD-10-CM

## 2020-10-25 DIAGNOSIS — R1084 Generalized abdominal pain: Secondary | ICD-10-CM | POA: Diagnosis not present

## 2020-10-25 DIAGNOSIS — J449 Chronic obstructive pulmonary disease, unspecified: Secondary | ICD-10-CM

## 2020-10-25 DIAGNOSIS — N184 Chronic kidney disease, stage 4 (severe): Secondary | ICD-10-CM

## 2020-10-25 DIAGNOSIS — N170 Acute kidney failure with tubular necrosis: Secondary | ICD-10-CM

## 2020-10-25 DIAGNOSIS — Z79899 Other long term (current) drug therapy: Secondary | ICD-10-CM

## 2020-10-25 DIAGNOSIS — R112 Nausea with vomiting, unspecified: Secondary | ICD-10-CM

## 2020-10-25 DIAGNOSIS — E039 Hypothyroidism, unspecified: Secondary | ICD-10-CM | POA: Diagnosis not present

## 2020-10-25 DIAGNOSIS — Z794 Long term (current) use of insulin: Secondary | ICD-10-CM

## 2020-10-25 DIAGNOSIS — I739 Peripheral vascular disease, unspecified: Secondary | ICD-10-CM

## 2020-10-25 DIAGNOSIS — N185 Chronic kidney disease, stage 5: Secondary | ICD-10-CM

## 2020-10-25 DIAGNOSIS — E876 Hypokalemia: Secondary | ICD-10-CM

## 2020-10-25 DIAGNOSIS — K2100 Gastro-esophageal reflux disease with esophagitis, without bleeding: Secondary | ICD-10-CM

## 2020-10-26 ENCOUNTER — Encounter (HOSPITAL_COMMUNITY): Payer: HMO

## 2020-10-26 ENCOUNTER — Encounter (HOSPITAL_COMMUNITY): Payer: Self-pay

## 2020-10-26 ENCOUNTER — Observation Stay (HOSPITAL_COMMUNITY)
Admission: EM | Admit: 2020-10-26 | Discharge: 2020-10-28 | Disposition: A | Discharge status: Home / Self Care | Payer: HMO | Attending: Internal Medicine | Admitting: Internal Medicine

## 2020-10-26 ENCOUNTER — Other Ambulatory Visit: Payer: Self-pay

## 2020-10-26 DIAGNOSIS — E876 Hypokalemia: Secondary | ICD-10-CM | POA: Diagnosis not present

## 2020-10-26 DIAGNOSIS — Z20822 Contact with and (suspected) exposure to covid-19: Secondary | ICD-10-CM | POA: Diagnosis not present

## 2020-10-26 DIAGNOSIS — K219 Gastro-esophageal reflux disease without esophagitis: Secondary | ICD-10-CM | POA: Diagnosis not present

## 2020-10-26 DIAGNOSIS — Z79899 Other long term (current) drug therapy: Secondary | ICD-10-CM | POA: Diagnosis not present

## 2020-10-26 DIAGNOSIS — D649 Anemia, unspecified: Secondary | ICD-10-CM

## 2020-10-26 DIAGNOSIS — Z86718 Personal history of other venous thrombosis and embolism: Secondary | ICD-10-CM | POA: Insufficient documentation

## 2020-10-26 DIAGNOSIS — Z794 Long term (current) use of insulin: Secondary | ICD-10-CM | POA: Diagnosis not present

## 2020-10-26 DIAGNOSIS — Z85118 Personal history of other malignant neoplasm of bronchus and lung: Secondary | ICD-10-CM | POA: Insufficient documentation

## 2020-10-26 DIAGNOSIS — N184 Chronic kidney disease, stage 4 (severe): Secondary | ICD-10-CM | POA: Insufficient documentation

## 2020-10-26 DIAGNOSIS — J449 Chronic obstructive pulmonary disease, unspecified: Secondary | ICD-10-CM | POA: Diagnosis not present

## 2020-10-26 DIAGNOSIS — Z8544 Personal history of malignant neoplasm of other female genital organs: Secondary | ICD-10-CM | POA: Insufficient documentation

## 2020-10-26 DIAGNOSIS — N179 Acute kidney failure, unspecified: Principal | ICD-10-CM | POA: Insufficient documentation

## 2020-10-26 DIAGNOSIS — D509 Iron deficiency anemia, unspecified: Secondary | ICD-10-CM | POA: Insufficient documentation

## 2020-10-26 DIAGNOSIS — Z7901 Long term (current) use of anticoagulants: Secondary | ICD-10-CM | POA: Diagnosis not present

## 2020-10-26 DIAGNOSIS — F419 Anxiety disorder, unspecified: Secondary | ICD-10-CM | POA: Diagnosis not present

## 2020-10-26 DIAGNOSIS — E1165 Type 2 diabetes mellitus with hyperglycemia: Secondary | ICD-10-CM | POA: Diagnosis present

## 2020-10-26 DIAGNOSIS — R799 Abnormal finding of blood chemistry, unspecified: Secondary | ICD-10-CM | POA: Diagnosis present

## 2020-10-26 DIAGNOSIS — I129 Hypertensive chronic kidney disease with stage 1 through stage 4 chronic kidney disease, or unspecified chronic kidney disease: Secondary | ICD-10-CM | POA: Diagnosis not present

## 2020-10-26 DIAGNOSIS — F319 Bipolar disorder, unspecified: Secondary | ICD-10-CM | POA: Insufficient documentation

## 2020-10-26 DIAGNOSIS — E0822 Diabetes mellitus due to underlying condition with diabetic chronic kidney disease: Secondary | ICD-10-CM | POA: Diagnosis not present

## 2020-10-26 DIAGNOSIS — IMO0002 Reserved for concepts with insufficient information to code with codable children: Secondary | ICD-10-CM | POA: Diagnosis present

## 2020-10-26 DIAGNOSIS — E1122 Type 2 diabetes mellitus with diabetic chronic kidney disease: Secondary | ICD-10-CM | POA: Diagnosis present

## 2020-10-26 DIAGNOSIS — E039 Hypothyroidism, unspecified: Secondary | ICD-10-CM | POA: Diagnosis not present

## 2020-10-26 DIAGNOSIS — N189 Chronic kidney disease, unspecified: Secondary | ICD-10-CM

## 2020-10-26 DIAGNOSIS — I10 Essential (primary) hypertension: Secondary | ICD-10-CM | POA: Diagnosis present

## 2020-10-26 HISTORY — DX: Acute embolism and thrombosis of unspecified deep veins of unspecified lower extremity: I82.409

## 2020-10-26 HISTORY — DX: Chronic kidney disease, stage 4 (severe): N18.4

## 2020-10-26 LAB — CBC
HCT: 23.8 % — ABNORMAL LOW (ref 36.0–46.0)
Hemoglobin: 7.6 g/dL — ABNORMAL LOW (ref 12.0–15.0)
MCH: 30.3 pg (ref 26.0–34.0)
MCHC: 31.9 g/dL (ref 30.0–36.0)
MCV: 94.8 fL (ref 80.0–100.0)
Platelets: 352 10*3/uL (ref 150–400)
RBC: 2.51 MIL/uL — ABNORMAL LOW (ref 3.87–5.11)
RDW: 14.2 % (ref 11.5–15.5)
WBC: 5.9 10*3/uL (ref 4.0–10.5)
nRBC: 0 % (ref 0.0–0.2)

## 2020-10-26 LAB — CBC WITH DIFFERENTIAL/PLATELET
Absolute Monocytes: 551 cells/uL (ref 200–950)
Basophils Absolute: 21 cells/uL (ref 0–200)
Basophils Relative: 0.4 %
Eosinophils Absolute: 69 cells/uL (ref 15–500)
Eosinophils Relative: 1.3 %
HCT: 22.5 % — ABNORMAL LOW (ref 35.0–45.0)
Hemoglobin: 7.1 g/dL — ABNORMAL LOW (ref 11.7–15.5)
Lymphs Abs: 678 cells/uL — ABNORMAL LOW (ref 850–3900)
MCH: 29.5 pg (ref 27.0–33.0)
MCHC: 31.6 g/dL — ABNORMAL LOW (ref 32.0–36.0)
MCV: 93.4 fL (ref 80.0–100.0)
MPV: 9.1 fL (ref 7.5–12.5)
Monocytes Relative: 10.4 %
Neutro Abs: 3980 cells/uL (ref 1500–7800)
Neutrophils Relative %: 75.1 %
Platelets: 378 10*3/uL (ref 140–400)
RBC: 2.41 10*6/uL — ABNORMAL LOW (ref 3.80–5.10)
RDW: 13 % (ref 11.0–15.0)
Total Lymphocyte: 12.8 %
WBC: 5.3 10*3/uL (ref 3.8–10.8)

## 2020-10-26 LAB — COMPLETE METABOLIC PANEL WITH GFR
AG Ratio: 1.6 (calc) (ref 1.0–2.5)
ALT: 18 U/L (ref 6–29)
AST: 18 U/L (ref 10–35)
Albumin: 3.1 g/dL — ABNORMAL LOW (ref 3.6–5.1)
Alkaline phosphatase (APISO): 46 U/L (ref 37–153)
BUN/Creatinine Ratio: 12 (calc) (ref 6–22)
BUN: 41 mg/dL — ABNORMAL HIGH (ref 7–25)
CO2: 22 mmol/L (ref 20–32)
Calcium: 8.4 mg/dL — ABNORMAL LOW (ref 8.6–10.4)
Chloride: 102 mmol/L (ref 98–110)
Creat: 3.51 mg/dL — ABNORMAL HIGH (ref 0.60–0.93)
GFR, Est African American: 14 mL/min/{1.73_m2} — ABNORMAL LOW (ref >=60)
GFR, Est Non African American: 12 mL/min/{1.73_m2} — ABNORMAL LOW (ref >=60)
Globulin: 2 g/dL (calc) (ref 1.9–3.7)
Glucose, Bld: 197 mg/dL — ABNORMAL HIGH (ref 65–99)
Potassium: 4.4 mmol/L (ref 3.5–5.3)
Sodium: 133 mmol/L — ABNORMAL LOW (ref 135–146)
Total Bilirubin: 0.4 mg/dL (ref 0.2–1.2)
Total Protein: 5.1 g/dL — ABNORMAL LOW (ref 6.1–8.1)

## 2020-10-26 LAB — PROTIME-INR
INR: 2.2 — ABNORMAL HIGH (ref 0.8–1.2)
Prothrombin Time: 24 seconds — ABNORMAL HIGH (ref 11.4–15.2)

## 2020-10-26 LAB — COMPREHENSIVE METABOLIC PANEL
ALT: 23 U/L (ref 0–44)
AST: 23 U/L (ref 15–41)
Albumin: 2.8 g/dL — ABNORMAL LOW (ref 3.5–5.0)
Alkaline Phosphatase: 48 U/L (ref 38–126)
Anion gap: 11 (ref 5–15)
BUN: 35 mg/dL — ABNORMAL HIGH (ref 8–23)
CO2: 20 mmol/L — ABNORMAL LOW (ref 22–32)
Calcium: 8.8 mg/dL — ABNORMAL LOW (ref 8.9–10.3)
Chloride: 101 mmol/L (ref 98–111)
Creatinine, Ser: 3.65 mg/dL — ABNORMAL HIGH (ref 0.44–1.00)
GFR, Estimated: 13 mL/min — ABNORMAL LOW (ref >60)
Glucose, Bld: 167 mg/dL — ABNORMAL HIGH (ref 70–99)
Potassium: 3.4 mmol/L — ABNORMAL LOW (ref 3.5–5.1)
Sodium: 132 mmol/L — ABNORMAL LOW (ref 135–145)
Total Bilirubin: 1.1 mg/dL (ref 0.3–1.2)
Total Protein: 5.7 g/dL — ABNORMAL LOW (ref 6.5–8.1)

## 2020-10-26 LAB — IRON AND TIBC
Iron: 76 ug/dL (ref 28–170)
Saturation Ratios: 26 % (ref 10.4–31.8)
TIBC: 295 ug/dL (ref 250–450)
UIBC: 219 ug/dL

## 2020-10-26 LAB — POC OCCULT BLOOD, ED: Fecal Occult Bld: NEGATIVE

## 2020-10-26 LAB — RETICULOCYTES
Immature Retic Fract: 24.7 % — ABNORMAL HIGH (ref 2.3–15.9)
RBC.: 2.51 MIL/uL — ABNORMAL LOW (ref 3.87–5.11)
Retic Count, Absolute: 95.1 K/uL (ref 19.0–186.0)
Retic Ct Pct: 3.8 % — ABNORMAL HIGH (ref 0.4–3.1)

## 2020-10-26 LAB — FERRITIN: Ferritin: 152 ng/mL (ref 11–307)

## 2020-10-26 LAB — GLUCOSE, CAPILLARY: Glucose-Capillary: 250 mg/dL — ABNORMAL HIGH (ref 70–99)

## 2020-10-26 LAB — FOLATE: Folate: 9.5 ng/mL (ref >5.9)

## 2020-10-26 LAB — ABO/RH: ABO/RH(D): O NEG

## 2020-10-26 LAB — VITAMIN B12: Vitamin B-12: 470 pg/mL (ref 180–914)

## 2020-10-26 MED ORDER — ACETAMINOPHEN 325 MG PO TABS: 650.0000 mg | ORAL_TABLET | Freq: Four times a day (QID) | ORAL | Status: DC | PRN

## 2020-10-26 MED ORDER — ACETAMINOPHEN 650 MG RE SUPP: 650.0000 mg | Freq: Four times a day (QID) | RECTAL | Status: DC | PRN

## 2020-10-26 MED ORDER — INSULIN ASPART 100 UNIT/ML ~~LOC~~ SOLN
0.0000 [IU] | Freq: Three times a day (TID) | SUBCUTANEOUS | Status: DC
  Administered 2020-10-27 (×2): 2 [IU] via SUBCUTANEOUS
  Administered 2020-10-28: 1 [IU] via SUBCUTANEOUS

## 2020-10-26 MED ORDER — FAMOTIDINE 20 MG PO TABS
20.0000 mg | ORAL_TABLET | Freq: Two times a day (BID) | ORAL | Status: DC
  Administered 2020-10-26 – 2020-10-27 (×2): 20 mg via ORAL
  Filled 2020-10-26 (×2): qty 1

## 2020-10-26 MED ORDER — ALBUTEROL SULFATE (2.5 MG/3ML) 0.083% IN NEBU
2.5000 mg | INHALATION_SOLUTION | Freq: Four times a day (QID) | RESPIRATORY_TRACT | Status: DC
  Administered 2020-10-27: 2.5 mg via RESPIRATORY_TRACT
  Filled 2020-10-26: qty 3

## 2020-10-26 MED ORDER — ATORVASTATIN CALCIUM 40 MG PO TABS
40.0000 mg | ORAL_TABLET | Freq: Every day | ORAL | Status: DC
  Administered 2020-10-27 – 2020-10-28 (×2): 40 mg via ORAL
  Filled 2020-10-26 (×2): qty 1

## 2020-10-26 MED ORDER — HYDRALAZINE HCL 50 MG PO TABS
100.0000 mg | ORAL_TABLET | Freq: Three times a day (TID) | ORAL | Status: DC
  Administered 2020-10-26 – 2020-10-28 (×5): 100 mg via ORAL
  Filled 2020-10-26 (×5): qty 2

## 2020-10-26 MED ORDER — INSULIN ASPART 100 UNIT/ML ~~LOC~~ SOLN
0.0000 [IU] | Freq: Every day | SUBCUTANEOUS | Status: DC
  Administered 2020-10-26 – 2020-10-27 (×2): 2 [IU] via SUBCUTANEOUS

## 2020-10-26 MED ORDER — POLYETHYLENE GLYCOL 3350 17 G PO PACK: 17.0000 g | PACK | Freq: Every day | ORAL | Status: DC | PRN

## 2020-10-26 MED ORDER — INSULIN GLARGINE 100 UNIT/ML ~~LOC~~ SOLN
15.0000 [IU] | Freq: Every day | SUBCUTANEOUS | Status: DC
  Administered 2020-10-26: 15 [IU] via SUBCUTANEOUS
  Filled 2020-10-26: qty 0.15

## 2020-10-26 MED ORDER — FLUOXETINE HCL 20 MG PO CAPS
20.0000 mg | ORAL_CAPSULE | Freq: Every day | ORAL | Status: DC
  Administered 2020-10-27 – 2020-10-28 (×2): 20 mg via ORAL
  Filled 2020-10-26 (×2): qty 1

## 2020-10-26 MED ORDER — SODIUM CHLORIDE 0.9 % IV SOLN: INTRAVENOUS | Status: DC

## 2020-10-26 MED ORDER — HYDRALAZINE HCL 20 MG/ML IJ SOLN: 10.0000 mg | Freq: Four times a day (QID) | INTRAMUSCULAR | Status: DC | PRN

## 2020-10-26 MED ORDER — AMLODIPINE BESYLATE 10 MG PO TABS
10.0000 mg | ORAL_TABLET | Freq: Every day | ORAL | Status: DC
  Administered 2020-10-27 – 2020-10-28 (×2): 10 mg via ORAL
  Filled 2020-10-26 (×2): qty 1

## 2020-10-26 MED ORDER — FERROUS SULFATE 325 (65 FE) MG PO TABS
325.0000 mg | ORAL_TABLET | Freq: Every day | ORAL | Status: DC
  Administered 2020-10-27 – 2020-10-28 (×2): 325 mg via ORAL
  Filled 2020-10-26 (×2): qty 1

## 2020-10-26 MED ORDER — LEVOTHYROXINE SODIUM 88 MCG PO TABS
88.0000 ug | ORAL_TABLET | Freq: Every day | ORAL | Status: DC
  Administered 2020-10-27 – 2020-10-28 (×2): 88 ug via ORAL
  Filled 2020-10-26 (×2): qty 1

## 2020-10-26 MED ORDER — CLORAZEPATE DIPOTASSIUM 3.75 MG PO TABS: 7.5000 mg | ORAL_TABLET | Freq: Two times a day (BID) | ORAL | Status: DC | PRN

## 2020-10-26 MED ORDER — POTASSIUM CHLORIDE CRYS ER 10 MEQ PO TBCR
10.0000 meq | EXTENDED_RELEASE_TABLET | Freq: Two times a day (BID) | ORAL | Status: DC
  Administered 2020-10-26 – 2020-10-28 (×4): 10 meq via ORAL
  Filled 2020-10-26 (×4): qty 1

## 2020-10-26 NOTE — ED Triage Notes (Signed)
Patient was called and told to come to the ED for a blood transfusion. Hgb-7.1 Patient denies any visible bleeding.  Patient has swelling of bilateral lower extremities.  Patientis currently taking Coumadin due to a known DVT.

## 2020-10-26 NOTE — Progress Notes (Signed)
============================================================ ============================================================  -    Kidney functions - about the same   -- Still CKD  Stage 5    (GFR = 12 )   ============================================================ ============================================================  - Hgb / Red cell count has dropped significantly in 2 weeks from                                                    Hgb 10.1  and 9.3 gm% 2 weeks ago down to                                                                     now Hgb 7.1 gm% - which is critical    - Recommend go to [facility] ER  to have Blood count rechecked                                                                      - likely will need a blood transfusion   ============================================================ ============================================================
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
## 2020-10-26 NOTE — Plan of Care (Signed)

## 2020-10-26 NOTE — ED Provider Notes (Signed)
[facility] DEPT Provider Note   CSN: [id number] Arrival date & time: 10/26/20  1325     History Chief Complaint  Patient presents with   Abnormal Lab    Ashley [name] is a 75 y.o. female with PMH/o Bipolar, CKD, DM, DVT (Coumadin) who presents for evaluation of concerns for low hemoglobin.  Patient reports that she was admitted in February for abdominal pain.  At that time, she had some worsening of her chronic kidney disease.  She was discharged home and states she had been doing better.  She had a follow-up appoint with her doctor yesterday and was called today and told her hemoglobin was 7.1 and that she needs to go evaluation to the emergency department.  Patient states that she takes iron pills and so her stools have been black for a long time and has not noticed any new changes.  She is not bleeding from her gums or nose.  She does report she fell on Super Bowl Sunday (2/13) and has since had bruising noted to her right lower extremity. She cannot recall if she had another fall since then.  She is on Coumadin and states she has not missed any doses.  She states she has been feeling better since her previous admission.  She denies any fevers, chest pain, abdominal pain, nausea/vomiting, difficulty breathing.  She has had some worsening swelling in her legs but states that swelling is normal for her and she feels like it is improving slightly.  The history is provided by the patient.       Past Medical History:  Diagnosis Date   Anxiety disorder    Arthritis LOWER BACK   Asthma    Bipolar 1 disorder (HCC)    Carpal tunnel syndrome    Chronic kidney disease    stage 3   Depression    Diabetes mellitus    type 2   DVT (deep venous thrombosis) (HCC)    Dyslipidemia    Dyspnea    with much activity   Gait disorder    GERD (gastroesophageal reflux disease)    Gout LEFT FOOT-  STABLE   H/O hiatal hernia    History of CVA  (cerebrovascular accident) FOUND PER MRI 1994--  RESIDUAL MEMORY IMPAIRED   Hypercholesteremia    Memory difficulty 08/14/2016   OCD (obsessive compulsive disorder)    Stroke ([place])    2000 memory loss    SUI (stress urinary incontinence, female)    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    Patient Active Problem List   Diagnosis Date Noted   Acute renal failure superimposed on stage 5 chronic kidney disease, not on chronic dialysis ([place]) 10/09/2020   AKI (acute kidney injury) ([place]) 10/09/2020   Supratherapeutic INR    Constipation 08/27/2020   Generalized abdominal pain 08/27/2020   Aortic atherosclerosis ([place]) by Chest CT scan 01/2020 06/19/2020   Long term (current) use of anticoagulants 04/26/2020   Acute deep vein thrombosis (DVT) of femoral vein of right lower extremity ([place]) 03/29/2020   Cancer ([place]) 03/07/2020   History of lung cancer 03/07/2020   Acquired thrombophilia ([place]) 11/29/2019   Hypokalemia 06/30/2019   Right lower lobe pulmonary nodule 06/24/2019   Long term (current) use of insulin ([place]) 02/21/2019   Obesity (BMI 30.0-34.9) 01/06/2018   Diabetes mellitus type 2, uncontrolled ([place]) 12/17/2017   Peripheral arterial disease ([place]) 10/20/2017   Polyp of ascending colon    Mild nonproliferative diabetic retinopathy of both eyes without  macular edema associated with type 2 diabetes mellitus ([place]) 06/09/2017   Senile purpura ([place]) 06/09/2017   Hyperlipidemia associated with type 2 diabetes mellitus ([place]) 06/08/2017   Memory difficulty 08/14/2016   Frequent falls 04/08/2016   Hypothyroidism 03/05/2016   GERD (gastroesophageal reflux disease) 03/05/2016   CKD stage 4 due to type 2 diabetes mellitus ([place]) 03/05/2016   Gout 08/29/2015   Non compliance w medication regimen 02/20/2015   Bipolar depression ([place]) 11/06/2014   Vitamin D deficiency 12/01/2013   Medication management 12/01/2013   Abnormality of gait 03/10/2013   Carpal tunnel  syndrome 10/25/2012   Diabetic neuropathy ([place]) 10/25/2012   Major depressive disorder, recurrent episode ([place]) 10/12/2009   Essential hypertension 10/12/2009   COPD 10/12/2009    Past Surgical History:  Procedure Laterality Date   BLADDER SUSPENSION  1996   CATARACT EXTRACTION     COLONOSCOPY WITH PROPOFOL N/A 10/01/2017   Procedure: COLONOSCOPY WITH PROPOFOL;  Surgeon: [name], MD;  Location: [facility];  Service: Endoscopy;  Laterality: N/A;   FUDUCIAL PLACEMENT N/A 06/24/2019   Procedure: PLACEMENT OF FUDUCIAL MARKERS;  Surgeon: Garner Nash, DO;  Location: [facility];  Service: Thoracic;  Laterality: N/A;   [place]   VIDEO BRONCHOSCOPY WITH ENDOBRONCHIAL NAVIGATION N/A 06/24/2019   Procedure: VIDEO BRONCHOSCOPY WITH ENDOBRONCHIAL NAVIGATION;  Surgeon: Garner Nash, DO;  Location: [facility];  Service: Thoracic;  Laterality: N/A;   VULVAR LESION REMOVAL  06/22/2012   Procedure: VULVAR LESION;  Surgeon: Selinda Orion, MD;  Location: [facility];  Service: Gynecology;  Laterality: N/A;  WIDE EXCISION VULVAR LESION    VULVECTOMY PARTIAL  DEC 1999     OB History   No obstetric history on file.     Family History  Problem Relation Age of Onset   Stroke Father    Heart failure Father    Heart disease Father    Hyperlipidemia Father    Hypertension Father    Hypertension Brother    Hyperlipidemia Mother    Stroke Mother    Colon cancer Neg Hx     Social History   Tobacco Use   Smoking status: Current Every Day Smoker    Packs/day: 2.00    Years: 27.00    Pack years: 54.00    Types: Cigarettes   Smokeless tobacco: Never Used   Tobacco comment: Information on smoking cessation offered, pt refused information at this time  Vaping Use   Vaping Use: Never used  Substance Use Topics   Alcohol use: No    Alcohol/week: 0.0 standard drinks   Drug use: No     Home Medications Prior to Admission medications   Medication Sig Start Date End Date Taking? Authorizing Provider  amLODipine (NORVASC) 10 MG tablet Take 1 tablet (10 mg total) by mouth daily. 07/04/19  Yes Cristal Deer, MD  aspirin EC 81 MG tablet Take 81 mg by mouth daily.   Yes [provider]  atorvastatin (LIPITOR) 40 MG tablet TAKE 1 TABLET BY MOUTH EVERY DAY FOR CHOLESTEROL Patient taking differently: Take 40 mg by mouth daily. 09/25/20  Yes Liane Comber, NP  bumetanide (BUMEX) 1 MG tablet Take 1 mg by mouth as directed. Take 2 mg x 3 days and Then Start Taking 1 tablet (1 mg) daily as needed for swelling.   Yes [provider]  Cholecalciferol (VITAMIN D) 125 MCG (5000 UT)  CAPS Take 1 capsule by mouth in the morning and at bedtime.   Yes [provider]  famotidine (PEPCID) 20 MG tablet TAKE 1 TABLET TWICE A DAY WITH MEALS FOR INDIGESTION. Patient taking differently: Take 20 mg by mouth 2 (two) times daily. 11/30/19  Yes Unk Pinto, MD  fenofibrate (TRICOR) 145 MG tablet TAKE 1 TABLET DAILY FOR TRIGLYCERIDES (BLOOD FATS) Patient taking differently: Take 145 mg by mouth daily. 08/22/20  Yes Unk Pinto, MD  ferrous sulfate 325 (65 FE) MG tablet TAKE 1 TABLET 2 TIMES A DAY WITH A MEAL FOR IRON DEFICIENCY 10/10/20  Yes McClanahan, Kyra, NP  FLUoxetine (PROZAC) 20 MG capsule Take 20 mg by mouth daily.   Yes [provider]  hydrALAZINE (APRESOLINE) 100 MG tablet Take 1 tablet (100 mg total) by mouth every 8 (eight) hours. 10/14/20 01/12/21 Yes Dahal, Marlowe Aschoff, MD  levothyroxine (SYNTHROID) 88 MCG tablet Take   1 tablet    Daily   on an empty stomach with only water for 30 minutes & no Antacid meds, Calcium or Magnesium for 4 hours & avoid Biotin Patient taking differently: Take 88 mcg by mouth daily before breakfast. 09/05/20  Yes Unk Pinto, MD  Multiple Vitamins-Minerals (MULTIVITAMIN WITH MINERALS) tablet Take 1 tablet by mouth daily.    Yes [provider]  NOVOLIN N 100 UNIT/ML injection INJECT 50 UNITS EVERY MORNING AND 25 UNITS EVERY EVENING Patient taking differently: Inject 25-50 Units into the skin 3 (three) times daily as needed (high blood sugar). Injects 25-50 units into the skin after checking CBG daily (CBG 160-299 mg/dL: 25 units; CBG >300 mg/dl: 50 units); gives extra 25 units in the evening if taking a big meal 05/09/20  Yes Unk Pinto, MD  nystatin cream (MYCOSTATIN) Apply 1 application topically daily as needed for dry skin. 10/17/20  Yes [provider]  potassium chloride (KLOR-CON M10) 10 MEQ tablet TAKE 1 TABLET 3 X /DAY WITH MEALS FOR POTASSIUM Patient taking differently: Take 10 mEq by mouth 3 (three) times daily. 01/08/20  Yes Unk Pinto, MD  vitamin C (ASCORBIC ACID) 500 MG tablet Take 500 mg by mouth daily. Take with iron/ferrous sulfate   Yes [provider]  warfarin (COUMADIN) 5 MG tablet TAKE 1 TABLET BY MOUTH EVERY DAY Patient taking differently: Take 2.5-5 mg by mouth See admin instructions. Takes 1 tablet (5 mg totally) by mouth every day; Except 0.5 tablet (2.5 mg totally) on Monday 07/06/20  Yes Hilty, Nadean Corwin, MD  acetaminophen (TYLENOL) 500 MG tablet Take 1,000 mg by mouth every 6 (six) hours as needed for mild pain or headache.    [provider]  azelastine (ASTELIN) 0.1 % nasal spray PLACE 2 SPRAYS INTO BOTH NOSTRILS 2 (TWO) TIMES DAILY. USE IN EACH NOSTRIL AS DIRECTED Patient taking differently: Place 2 sprays into both nostrils 2 (two) times daily as needed for allergies. 05/29/20   Unk Pinto, MD  clorazepate (TRANXENE-T) 7.5 MG tablet Take    1/2 to 1 tablet    1 or 2 x /day    ONLY if needed for  Nerves Patient taking differently: Take 7.5 mg by mouth 2 (two) times daily as needed for anxiety or sleep. 05/22/20   Unk Pinto, MD  [facility] ULTRA test strip TEST BLOOD SUGAR 3 TIMES A DAY 01/27/20   Vicie Mutters R, PA-C  polyethylene  glycol (MIRALAX / GLYCOLAX) 17 g packet Take 17 g by mouth daily. Patient taking differently: Take 17 g by mouth  daily as needed for mild constipation. 03/06/20   Vladimir Crofts, PA-C    Allergies    Sulfa antibiotics and Sulfacetamide sodium  Review of Systems   Review of Systems  Constitutional: Negative for fever.  Respiratory: Negative for cough and shortness of breath.   Cardiovascular: Negative for chest pain.  Gastrointestinal: Negative for abdominal pain, blood in stool, nausea and vomiting.  Genitourinary: Negative for dysuria and hematuria.  Skin: Positive for color change.  Neurological: Negative for headaches.  All other systems reviewed and are negative.   Physical Exam Updated Vital Signs BP (!) 146/77    Pulse 87    Temp 97.8 F (36.6 C) (Oral)    Resp 18    Ht 5\' 3"  (1.6 m)    Wt 72.8 kg    SpO2 96%    BMI 28.41 kg/m   Physical Exam Vitals and nursing note reviewed. Exam conducted with a chaperone present.  Constitutional:      Appearance: Normal appearance. She is well-developed.  HENT:     Head: Normocephalic and atraumatic.  Eyes:     General: Lids are normal.     Conjunctiva/sclera: Conjunctivae normal.     Pupils: Pupils are equal, round, and reactive to light.     Comments: Pale conjunctiva bilaterally.  Cardiovascular:     Rate and Rhythm: Normal rate and regular rhythm.     Pulses: Normal pulses.     Heart sounds: Normal heart sounds. No murmur heard. No friction rub. No gallop.   Pulmonary:     Effort: Pulmonary effort is normal.     Breath sounds: Normal breath sounds.     Comments: Lungs clear to auscultation bilaterally.  Symmetric chest rise.  No wheezing, rales, rhonchi. Abdominal:     Palpations: Abdomen is soft. Abdomen is not rigid.     Tenderness: There is no abdominal tenderness. There is no guarding.     Comments: Abdomen is soft, non-distended, non-tender. No rigidity, No guarding. No peritoneal signs.  Genitourinary:     Comments: The exam was performed with a chaperone present Veterinary surgeon, Therapist, sports). Digital Rectal Exam reveals sphincter with good tone. No external hemorrhoids. No masses or fissures. Stool color is brown with no overt blood. Musculoskeletal:        General: Normal range of motion.     Cervical back: Full passive range of motion without pain.       Legs:     Comments: Large hematoma that extends from sacrum to the right buttocks down to the posterior aspect of the right thigh.  Good range of motion of right lower extremity without any difficulty.  No pelvic instability. 1+ pitting edema noted to BLE.   Skin:    General: Skin is warm and dry.     Capillary Refill: Capillary refill takes less than 2 seconds.  Neurological:     Mental Status: She is alert and oriented to person, place, and time.  Psychiatric:        Speech: Speech normal.     ED Results / Procedures / Treatments   Labs (all labs ordered are listed, but only abnormal results are displayed) Labs Reviewed  COMPREHENSIVE METABOLIC PANEL - Abnormal; Notable for the following components:      Result Value   Sodium 132 (*)    Potassium 3.4 (*)    CO2 20 (*)    Glucose, Bld 167 (*)    BUN 35 (*)    Creatinine, Ser 3.65 (*)  Calcium 8.8 (*)    Total Protein 5.7 (*)    Albumin 2.8 (*)    GFR, Estimated 13 (*)    All other components within normal limits  CBC - Abnormal; Notable for the following components:   RBC 2.51 (*)    Hemoglobin 7.6 (*)    HCT 23.8 (*)    All other components within normal limits  PROTIME-INR - Abnormal; Notable for the following components:   Prothrombin Time 24.0 (*)    INR 2.2 (*)    All other components within normal limits  RETICULOCYTES - Abnormal; Notable for the following components:   Retic Ct Pct 3.8 (*)    RBC. 2.51 (*)    Immature Retic Fract 24.7 (*)    All other components within normal limits  SARS CORONAVIRUS 2 (TAT 6-24 HRS)  VITAMIN B12  FOLATE  IRON AND TIBC  FERRITIN  POC  OCCULT BLOOD, ED  TYPE AND SCREEN  ABO/RH    EKG None  Radiology No results found.  Procedures Procedures   Medications Ordered in ED Medications - No data to display  ED Course  I have reviewed the triage vital signs and the nursing notes.  Pertinent labs & imaging results that were available during my care of the patient were reviewed by me and considered in my medical decision making (see chart for details).    MDM Rules/Calculators/A&P                          75 year old female who presents for evaluation of low hemoglobin.  She saw her primary care doctor for a follow-up after a admission that occurred in February.  She was told her hemoglobin was low and was told to come to the emergency department for further evaluation.  She denies any rectal bleeding.  She does report that she fell a few weeks ago and has had a hematoma to her right lower extremity.  On initial arrival, she is afebrile, toxic appearing.  Vital signs are stable.  On exam, she has a large hematoma that extends from sacrum to the right gluteus into the posterior right thigh.  Question if that is contributing to her anemia though she does state that she fell prior to coming into the emergency department being admitted in February.  Digital rectal exam reveals no black or tarry stools.  We will plan to recheck labs.   INR is 2.2.  CMP shows potassium of 3.4, BUN of 35, creatinine of 3.65.  CBC shows no leukopenia or leukocytosis.  Hemoglobin is 7.6.  Fecal occult is negative.  Review of her records show that she has had a continuous drop in her hemoglobin.  About 2 weeks ago, her hemoglobin was 13.6.  She had had some drops in the past but was usually consistently between 10.3-11.  During her admission, it down trended.  On discharge, it was 9.3.   She has had a downtrend of her hemoglobin over the last several months.  She has never been this low.  At this time, she does not need an emergent transfusion but  concerned as to why her hemoglobin continuously drops.  Question if this is continued from her chronic kidney disease and whether or not she needs further evaluation/management.  Given her comorbidities as well as severe drop over the last several months, will plan for admission.  Discussed with Dr. Pietro Cassis (hospitalist) who accepts patient for admission.   Portions of this  note were generated with [employer]. Dictation errors may occur despite best attempts at proofreading.  Final Clinical Impression(s) / ED Diagnoses Final diagnoses:  Anemia, unspecified type  Chronic kidney disease, unspecified CKD stage    Rx / DC Orders ED Discharge Orders    None       Desma Mcgregor 10/26/20 2007    Varney Biles, MD 10/28/20 1244

## 2020-10-27 ENCOUNTER — Encounter (HOSPITAL_COMMUNITY): Payer: Self-pay | Admitting: Internal Medicine

## 2020-10-27 ENCOUNTER — Observation Stay (HOSPITAL_BASED_OUTPATIENT_CLINIC_OR_DEPARTMENT_OTHER): Payer: HMO

## 2020-10-27 DIAGNOSIS — Z86718 Personal history of other venous thrombosis and embolism: Secondary | ICD-10-CM

## 2020-10-27 DIAGNOSIS — N179 Acute kidney failure, unspecified: Secondary | ICD-10-CM | POA: Diagnosis not present

## 2020-10-27 LAB — BASIC METABOLIC PANEL
Anion gap: 10 (ref 5–15)
BUN: 46 mg/dL — ABNORMAL HIGH (ref 8–23)
CO2: 23 mmol/L (ref 22–32)
Calcium: 8.8 mg/dL — ABNORMAL LOW (ref 8.9–10.3)
Chloride: 102 mmol/L (ref 98–111)
Creatinine, Ser: 3.47 mg/dL — ABNORMAL HIGH (ref 0.44–1.00)
GFR, Estimated: 13 mL/min — ABNORMAL LOW (ref >60)
Glucose, Bld: 71 mg/dL (ref 70–99)
Potassium: 3.3 mmol/L — ABNORMAL LOW (ref 3.5–5.1)
Sodium: 135 mmol/L (ref 135–145)

## 2020-10-27 LAB — CBC
HCT: 20.9 % — ABNORMAL LOW (ref 36.0–46.0)
Hemoglobin: 6.5 g/dL — CL (ref 12.0–15.0)
MCH: 29.1 pg (ref 26.0–34.0)
MCHC: 31.1 g/dL (ref 30.0–36.0)
MCV: 93.7 fL (ref 80.0–100.0)
Platelets: 359 10*3/uL (ref 150–400)
RBC: 2.23 MIL/uL — ABNORMAL LOW (ref 3.87–5.11)
RDW: 14.4 % (ref 11.5–15.5)
WBC: 4.5 10*3/uL (ref 4.0–10.5)
nRBC: 0 % (ref 0.0–0.2)

## 2020-10-27 LAB — SARS CORONAVIRUS 2 (TAT 6-24 HRS): SARS Coronavirus 2: NEGATIVE

## 2020-10-27 LAB — GLUCOSE, CAPILLARY
Glucose-Capillary: 53 mg/dL — ABNORMAL LOW (ref 70–99)
Glucose-Capillary: 94 mg/dL (ref 70–99)
Glucose-Capillary: 155 mg/dL — ABNORMAL HIGH (ref 70–99)
Glucose-Capillary: 181 mg/dL — ABNORMAL HIGH (ref 70–99)
Glucose-Capillary: 231 mg/dL — ABNORMAL HIGH (ref 70–99)

## 2020-10-27 LAB — HEMOGLOBIN AND HEMATOCRIT, BLOOD
HCT: 26.6 % — ABNORMAL LOW (ref 36.0–46.0)
Hemoglobin: 8.5 g/dL — ABNORMAL LOW (ref 12.0–15.0)

## 2020-10-27 LAB — PREPARE RBC (CROSSMATCH)

## 2020-10-27 MED ORDER — FAMOTIDINE 20 MG PO TABS
20.0000 mg | ORAL_TABLET | Freq: Every day | ORAL | Status: DC
  Administered 2020-10-28: 20 mg via ORAL
  Filled 2020-10-27: qty 1

## 2020-10-27 MED ORDER — INSULIN GLARGINE 100 UNIT/ML ~~LOC~~ SOLN
10.0000 [IU] | Freq: Every day | SUBCUTANEOUS | Status: DC
  Administered 2020-10-27: 10 [IU] via SUBCUTANEOUS
  Filled 2020-10-27: qty 0.1

## 2020-10-27 MED ORDER — BUMETANIDE 1 MG PO TABS
2.0000 mg | ORAL_TABLET | Freq: Every day | ORAL | Status: AC
  Administered 2020-10-27 – 2020-10-28 (×2): 2 mg via ORAL
  Filled 2020-10-27 (×2): qty 2

## 2020-10-27 MED ORDER — SODIUM CHLORIDE 0.9% IV SOLUTION: Freq: Once | INTRAVENOUS | Status: DC

## 2020-10-27 MED ORDER — ALBUTEROL SULFATE (2.5 MG/3ML) 0.083% IN NEBU: 2.5000 mg | INHALATION_SOLUTION | Freq: Four times a day (QID) | RESPIRATORY_TRACT | Status: DC | PRN

## 2020-10-27 NOTE — Progress Notes (Signed)
Bilateral lower extremity venous study completed.      Please see CV Proc for preliminary results.   Jeffre Enriques, RVT  

## 2020-10-27 NOTE — Progress Notes (Signed)
PT Cancellation Note  Patient Details Name: Ashley [name] MRN: [id number] DOB: [birth year]   Cancelled Treatment:    Reason Eval/Treat Not Completed: Patient at procedure or test/unavailable. Pt getting blood and requesting PT later in the day.  Will check back as schedule permits.   Galen Manila 10/27/2020, 12:17 PM
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
## 2020-10-27 NOTE — Progress Notes (Signed)
PROGRESS NOTE  Ashley [name]  DOB: 10/02/1945  PCP: Unk Pinto, MD YBO:[id number]  DOA: 10/26/2020  LOS: 0 days   Chief Complaint  Patient presents with  . Abnormal Lab   Brief narrative: Ashley [name] is a 75 y.o. female with PMH significant for T2DM, HTN, DVT on Coumadin, CKD stage IV, lung cancer in remission, vulvar cancer in remission, COPD, GERD, hiatal hernia, chronic iron deficiency, anxiety/bipolar disorder who was recently hospitalized from 2/21-2/27 for abdominal pain due to constipation, relieved with MiraLAX.  Hospital course was also complicated by AKI, hypertensive urgency, ultimately discharged home. She followed up with her primary care provider on 3/10 who did a blood work that resulted today 3/11 showing hemoglobin low at 7.1 and hence directed the patient to the ED.  She has been on Coumadin for several months for DVT.  Denies any blood in stool but always has black stool because of iron pills.  In the ED, patient was afebrile, blood pressure 159/76, breathing on room air.  She has bilateral pitting 1+ pedal edema. Labs showed sodium 132, potassium 3.4, BUN/creatinine 35/3.65, WBC 5.9, hemoglobin 7.6 compared to 7.1 yesterday. Patient was admitted to hospitalist service. See below for details  Subjective: Patient was seen and examined this morning.  Pleasant elderly Caucasian female.  Propped up in bed.  Not in distress.  Getting unit of blood transfusion today.  Pending nephrology evaluation  Assessment/Plan: Acute anemia -Anemia seems fairly recent.  Her hemoglobin prior to last hospitalization was consistently over 10.  It was 9.3 at discharge on 2/26.  Presented with hemoglobin of 7.6.  On Coumadin but no active bleeding.  FOBT negative.  Down to 6.5 today.  1 unit PRBC given today.  Continue to monitor.  Recent Labs    10/10/20 0438 10/10/20 0447 10/11/20 0317 10/12/20 0301 10/13/20 0311 10/25/20 1556 10/26/20 1435 10/27/20 0507  HGB 11.7*  --    <  > 10.1* 9.3* 7.1* 7.6* 6.5*  MCV 85.6  --    < > 88.7 90.5 93.4 94.8 93.7  VITAMINB12 1,461*  --   --   --   --   --  470  --   FOLATE  --  11.1  --   --   --   --  9.5  --   FERRITIN 180  --   --   --   --   --  152  --   TIBC 241*  --   --   --   --   --  295  --   IRON 138  --   --   --   --   --  76  --   RETICCTPCT  --  1.6  --   --   --   --  3.8*  --    < > = values in this interval not displayed.   Acute kidney injury superimposed on CKD stage IV, not on dialysis -Baseline creatinine 3-3.1.  During last hospitalization creatinine peaked at 4.59 and gradually improved back to baseline.  At discharge, I held back on torsemide and chlorthalidone.  Patient with elevated creatinine of 3.65. -Given IV hydration but also has coexisting pedal edema.  Creatinine slightly better today. -Obtain nephrology evaluation. -Patient follows up with nephrologist Dr. [name] as an outpatient. -Renal ultrasound from 2/24 was unremarkable. Recent Labs    10/08/20 2030 10/09/20 2778 10/10/20 2423 10/11/20 0317 10/12/20 0301 10/13/20 0311 10/14/20 0431 10/25/20 1556 10/26/20 1435  10/27/20 0507  BUN 99* 103* 78* 67* 63* 58* 49* 41* 35* 46*  CREATININE 4.59* 4.36* 3.66* 4.12* 3.67* 3.48* 3.01* 3.51* 3.65* 3.47*   Essential hypertension -2/26, she was discharged on amlodipine 10 mg daily and hydralazine 100 mg 3 times daily.  Chlorthalidone and torsemide were held because of soft blood pressure and recent AKI. -Continue amlodipine and hydralazine. Since patient has been started developing pedal edema, she may need some amount of long-term diuretics.  Defer to nephrology.   -Echo from 2/25 with EF of 60 to 65% and grade 1 diastolic dysfunction. -Continue to monitor blood pressure.  History of DVT on Coumadin -Chart reviewed.  Seen by Dr. Debara Pickett had an outpatient.  From the office note from September 2021, patient had nonresolving right femoral DVT with associated edema and failed half dose of  Eliquis in the past.  Because of this, she was placed on Coumadin.  He wanted to repeat a duplex in 4 months which has not been done yet. -We will obtain repeat DVT scan. If patient continues to have chronic established nonocclusive thrombus, I do not think patient needs long-term anticoagulation.  Stopping Coumadin may help avoid anemia from getting worse.  Will discuss with Dr. Debara Pickett on Monday.  Coumadin currently is on hold because of anemia. Recent Labs  Lab 10/26/20 1435  INR 2.2*   Chronic constipation -Last hospitalized was for severe abdominal pain related to constipation which resolved after she had a bowel movement with MiraLAX. -Continue bowel regimen with Senokot, MiraLAX as needed.  Hypokalemia -Chronically low potassium.  Continue supplement. Recent Labs  Lab 10/25/20 1556 10/26/20 1435 10/27/20 0507  K 4.4 3.4* 3.3*   Uncontrolled type 2 diabetes mellitus  -A1c 8.6 on 10/10/2020 -At home, patient uses Novolin 25 to 50 units twice daily. -With AKI, insulin requirement may fluctuate.  I gave her 15 units of Lantus last night.  Blood sugar level was low at 52 this morning.  Will reduce it to 10 units for tonight.  Continue sliding scale insulin with Accu-Cheks. Recent Labs  Lab 10/26/20 2230 10/27/20 0733 10/27/20 0754  GLUCAP 250* 53* 94   COPD -Currently no wheezing,patient has stopped smoking last week. Declined nicotine patch.  Hypothyroidism -Continue Synthroid  GERD (gastroesophageal reflux disease) -Continue PPI  Peripheral neuropathy Depression -Continue antidepressants. Patient states her peripheral neuropathy meds are adjusted as an outpatient by psychiatrist. Continue to follow.  Mobility: Encourage ambulation.  PT eval pending. Code Status:   Code Status: Full Code  Nutritional status: Body mass index is 27.57 kg/m.     Diet Order            Diet heart healthy/carb modified Room service appropriate? Yes; Fluid consistency: Thin   Diet effective now                 DVT prophylaxis: Therapeutic INR.   Antimicrobials:  None Fluid: IV fluid stopped.  Currently getting blood Consultants: Nephrology Family Communication:  None at bedside  Status is: Observation  The patient will require care spanning > 2 midnights and should be moved to inpatient because: Needs further inpatient monitoring of hemoglobin, creatinine, edema.  Dispo: The patient is from: Home              Anticipated d/c is to: Home, pending PT eval              Patient currently is not medically stable to d/c.   Difficult to place patient No  Infusions:    Scheduled Meds: . sodium chloride   Intravenous Once  . amLODipine  10 mg Oral Daily  . atorvastatin  40 mg Oral Daily  . famotidine  20 mg Oral BID  . ferrous sulfate  325 mg Oral Q breakfast  . FLUoxetine  20 mg Oral Daily  . hydrALAZINE  100 mg Oral Q8H  . insulin aspart  0-5 Units Subcutaneous QHS  . insulin aspart  0-9 Units Subcutaneous TID WC  . insulin glargine  10 Units Subcutaneous QHS  . levothyroxine  88 mcg Oral Q0600  . potassium chloride  10 mEq Oral BID    Antimicrobials: Anti-infectives (From admission, onward)   None      PRN meds: acetaminophen **OR** acetaminophen, albuterol, clorazepate, hydrALAZINE, polyethylene glycol   Objective: Vitals:   10/27/20 0937 10/27/20 1027  BP: (!) 156/69 (!) 157/65  Pulse: 82 82  Resp: 18 18  Temp: 97.8 F (36.6 C) 98.2 F (36.8 C)  SpO2:  99%    Intake/Output Summary (Last 24 hours) at 10/27/2020 1105 Last data filed at 10/27/2020 0730 Gross per 24 hour  Intake 240 ml  Output 1625 ml  Net -1385 ml   Filed Weights   10/26/20 1337 10/27/20 0518  Weight: 72.8 kg 70.6 kg   Weight change:  Body mass index is 27.57 kg/m.   Physical Exam: General exam: Pleasant, elderly Caucasian female.  Not in distress Skin: No rashes, lesions or ulcers. HEENT: Atraumatic, normocephalic, no obvious  bleeding Lungs: Clear to auscultation bilaterally CVS: Regular rate and rhythm, no murmur GI/Abd soft, nontender, nondistended, bowel sound present CNS: Alert, awake, oriented x3 Psychiatry: Mood appropriate Extremities: Pedal edema trace bilaterally.  Data Review: I have personally reviewed the laboratory data and studies available.  Recent Labs  Lab 10/25/20 1556 10/26/20 1435 10/27/20 0507  WBC 5.3 5.9 4.5  NEUTROABS 3,980  --   --   HGB 7.1* 7.6* 6.5*  HCT 22.5* 23.8* 20.9*  MCV 93.4 94.8 93.7  PLT 378 352 359   Recent Labs  Lab 10/25/20 1556 10/26/20 1435 10/27/20 0507  NA 133* 132* 135  K 4.4 3.4* 3.3*  CL 102 101 102  CO2 22 20* 23  GLUCOSE 197* 167* 71  BUN 41* 35* 46*  CREATININE 3.51* 3.65* 3.47*  CALCIUM 8.4* 8.8* 8.8*    F/u labs ordered Unresulted Labs (From admission, onward)          Start     Ordered   10/28/20 0500  Protime-INR  Daily,   R      10/27/20 1102   10/27/20 3646  Basic metabolic panel  Daily,   R      10/26/20 2136   10/27/20 0500  CBC  Daily,   R      10/26/20 2136          Signed, Terrilee Croak, MD [facility] 10/27/2020

## 2020-10-27 NOTE — Evaluation (Signed)
Physical Therapy Evaluation Patient Details Name: Ashley [name] MRN: [id number] DOB: August 14, 1946 Today's Date: 10/27/2020   History of Present Illness  Jayma Volpi Hinesley is a 75 y.o. female with PMH/o Bipolar, CKD, DM, DVT (Coumadin), COPD, chronic constipation who presents for evaluation of concerns for low hemoglobin.  Clinical Impression  Pt admitted with above diagnosis.  Pt currently with functional limitations due to the deficits listed below (see PT Problem List). Pt will benefit from skilled PT to increase their independence and safety with mobility to allow discharge to the venue listed below.  Feel pt is close to baseline level.  Will follow acutely, but do not feel she will need any PT at time of d/c, but will continue to assess.     Follow Up Recommendations No PT follow up    Equipment Recommendations  None recommended by PT    Recommendations for Other Services       Precautions / Restrictions Precautions Precautions: Fall      Mobility  Bed Mobility               General bed mobility comments: up in [facility] upon arrival    Transfers Overall transfer level: Needs assistance Equipment used: None Transfers: Sit to/from Stand Sit to Stand: Min guard         General transfer comment: Min/guard from [facility]  Ambulation/Gait Ambulation/Gait assistance: Min guard;Supervision Gait Distance (Feet): 50 Feet Assistive device: None;1 person hand held assist Gait Pattern/deviations: Step-through pattern;Trunk flexed Gait velocity: Decreased   General Gait Details: Slow gait and deliberate due to neuropathy, but no LOB and PT walking arm in arm vs. HHA. Pt  declined leaving the room due to not wanting to wear a mask in the hallway.  Stairs            Wheelchair Mobility    Modified Rankin (Stroke Patients Only)       Balance Overall balance assessment: Needs assistance Sitting-balance support: Feet supported Sitting balance-Leahy Scale: Good        Standing balance-Leahy Scale: Fair                               Pertinent Vitals/Pain Faces Pain Scale: Hurts little more Pain Location: legs- neuropathy    Home Living Family/patient expects to be discharged to:: Private residence Living Arrangements: Spouse/significant other Available Help at Discharge: Available PRN/intermittently;Family Type of Home: House Home Access: Stairs to enter Entrance Stairs-Rails: None Entrance Stairs-Number of Steps: 2 Home Layout: One level Home Equipment: Cane - single point;Walker - 2 wheels;Walker - 4 wheels      Prior Function Level of Independence: Independent with assistive device(s)         Comments: Amb with cane and cruises wall.     Hand Dominance   Dominant Hand: Right    Extremity/Trunk Assessment   Upper Extremity Assessment Upper Extremity Assessment: Overall WFL for tasks assessed    Lower Extremity Assessment Lower Extremity Assessment: Generalized weakness    Cervical / Trunk Assessment Cervical / Trunk Assessment: Kyphotic  Communication   Communication: No difficulties  Cognition Arousal/Alertness: Awake/alert Behavior During Therapy: WFL for tasks assessed/performed   Area of Impairment: Problem solving                             Problem Solving: Slow processing General Comments: Alert and oriented, but slightly slower processing noted  at times.      General Comments      Exercises     Assessment/Plan    PT Assessment Patient needs continued PT services  PT Problem List Decreased strength;Decreased activity tolerance;Decreased balance;Decreased mobility;Impaired sensation       PT Treatment Interventions DME instruction;Gait training;Stair training;Functional mobility training;Therapeutic activities;Therapeutic exercise;Balance training    PT Goals (Current goals can be found in the Care Plan section)  Acute Rehab PT Goals Patient Stated Goal: home PT Goal  Formulation: With patient Time For Goal Achievement: 11/10/20 Potential to Achieve Goals: Good    Frequency Min 3X/week   Barriers to discharge        Co-evaluation               AM-PAC PT "6 Clicks" Mobility  Outcome Measure Help needed turning from your back to your side while in a flat bed without using bedrails?: None Help needed moving from lying on your back to sitting on the side of a flat bed without using bedrails?: None Help needed moving to and from a bed to a chair (including a wheelchair)?: A Little Help needed standing up from a chair using your arms (e.g., wheelchair or bedside chair)?: A Little Help needed to walk in hospital room?: A Little Help needed climbing 3-5 steps with a railing? : A Little 6 Click Score: 20    End of Session Equipment Utilized During Treatment: Gait belt Activity Tolerance: Patient tolerated treatment well Patient left: in chair;with call bell/phone within reach;with family/visitor present Nurse Communication: Mobility status PT Visit Diagnosis: Unsteadiness on feet (R26.81);Muscle weakness (generalized) (M62.81)    Time: 7591-6384 PT Time Calculation (min) (ACUTE ONLY): 19 min   Charges:   PT Evaluation $PT Eval Low Complexity: 1 Low          Karen L. Tamala Julian, [place] Pager [phone] 10/27/2020   Galen Manila 10/27/2020, 3:01 PM

## 2020-10-27 NOTE — Progress Notes (Signed)
Hemoglobin level was 6.5. The PCP was notified.

## 2020-10-28 DIAGNOSIS — N179 Acute kidney failure, unspecified: Secondary | ICD-10-CM | POA: Diagnosis not present

## 2020-10-28 LAB — PROTIME-INR
INR: 2 — ABNORMAL HIGH (ref 0.8–1.2)
Prothrombin Time: 22 seconds — ABNORMAL HIGH (ref 11.4–15.2)

## 2020-10-28 LAB — BPAM RBC
Blood Product Expiration Date: 202204202359
ISSUE DATE / TIME: 202203120918
Unit Type and Rh: 9500

## 2020-10-28 LAB — TYPE AND SCREEN
ABO/RH(D): O NEG
Antibody Screen: NEGATIVE
Unit division: 0

## 2020-10-28 LAB — CBC
HCT: 28.9 % — ABNORMAL LOW (ref 36.0–46.0)
Hemoglobin: 9.5 g/dL — ABNORMAL LOW (ref 12.0–15.0)
MCH: 30.3 pg (ref 26.0–34.0)
MCHC: 32.9 g/dL (ref 30.0–36.0)
MCV: 92 fL (ref 80.0–100.0)
Platelets: 397 10*3/uL (ref 150–400)
RBC: 3.14 MIL/uL — ABNORMAL LOW (ref 3.87–5.11)
RDW: 14.6 % (ref 11.5–15.5)
WBC: 6 10*3/uL (ref 4.0–10.5)
nRBC: 0 % (ref 0.0–0.2)

## 2020-10-28 LAB — BASIC METABOLIC PANEL
Anion gap: 11 (ref 5–15)
BUN: 51 mg/dL — ABNORMAL HIGH (ref 8–23)
CO2: 22 mmol/L (ref 22–32)
Calcium: 8.8 mg/dL — ABNORMAL LOW (ref 8.9–10.3)
Chloride: 101 mmol/L (ref 98–111)
Creatinine, Ser: 3.88 mg/dL — ABNORMAL HIGH (ref 0.44–1.00)
GFR, Estimated: 12 mL/min — ABNORMAL LOW (ref >60)
Glucose, Bld: 83 mg/dL (ref 70–99)
Potassium: 3.5 mmol/L (ref 3.5–5.1)
Sodium: 134 mmol/L — ABNORMAL LOW (ref 135–145)

## 2020-10-28 LAB — GLUCOSE, CAPILLARY
Glucose-Capillary: 133 mg/dL — ABNORMAL HIGH (ref 70–99)
Glucose-Capillary: 136 mg/dL — ABNORMAL HIGH (ref 70–99)

## 2020-10-28 MED ORDER — BUMETANIDE 1 MG PO TABS: 1.0000 mg | ORAL_TABLET | Freq: Every day | ORAL | 2 refills | Status: DC | PRN

## 2020-10-28 NOTE — Progress Notes (Signed)
[facility] Progress Note  Subjective: seen in room, no new c/o  Vitals:   10/27/20 1515 10/27/20 2010 10/28/20 0535 10/28/20 1105  BP: (!) 167/98 (!) 178/78 (!) 176/76 (!) 169/71  Pulse:  97 86   Resp: 17 20 18    Temp: 98 F (36.7 C) 98.6 F (37 C) 97.7 F (36.5 C)   TempSrc: Oral     SpO2: 99% 97% 99%   Weight:      Height:        Exam: Gen alert, no distress No jvd or bruits Chest clear bilat to bases RRR no MRG Abd soft ntnd no mass or ascites +bs Ext +1- 2 bilat ankle and pedal edema, no other edema Neuro is alert, Ox 3 , nf       Home meds:  - norvasc 10 / hydral 100 tid/ bumex 85m qd x 3 then 1158mqd prn  - lipitor 40/ kcl 10 tid/ fenofibrate 145 qd  - clorazepate 7.5 bid prn/ prozac 20 qd  - insulin novolin 25-50 u tid ac  - coumadin qd  - pepcid 20 bid/ synthroid 88 qd    UA pending   Renal [place]/24/22 - last admit >> 9 cm kidneys w/o hydro bilat      Assessment/ Plan: 1. CKD IV - baseline creat 3.0- 3.7 , eGFR 12- 16, from Feb 2022. Renal function has been worsening over the last 5 years and she is not far from needing dialysis. Currently though there are no uremic signs.  Creat fluctuating in the 3- 4 range is acceptable for this patient. OK for dc home. I asked her to make an appt for labs at [facility] the next 7 -10 days.  2. Volume - mild vol excess w/ LE edema. Will get bumex [id number], then will be starting tomorrow on regimen of 1 mg daily only as needed as long as edema persists. Explained to husband and pt again in detail.  3. Anemia - due to ckd most likely.  Getting prbc's per pmd.  Consider esa in OP setting after dc.  4. DM2 on insulin 5. H/o DVT - finishing up coumadin course 6. Anxiety/ depression - cont meds       Rob Schertz 10/28/2020, 4:04 PM   Recent Labs  Lab 10/27/20 0507 10/27/20 2021 10/28/20 0540  K 3.3*  --  3.5  BUN 46*  --  51*  CREATININE 3.47*  --  3.88*  CALCIUM 8.8*  --  8.8*  HGB 6.5* 8.5*  9.5*   Inpatient medications: . amLODipine  10 mg Oral Daily  . atorvastatin  40 mg Oral Daily  . famotidine  20 mg Oral Daily  . ferrous sulfate  325 mg Oral Q breakfast  . FLUoxetine  20 mg Oral Daily  . hydrALAZINE  100 mg Oral Q8H  . insulin aspart  0-5 Units Subcutaneous QHS  . insulin aspart  0-9 Units Subcutaneous TID WC  . insulin glargine  10 Units Subcutaneous QHS  . levothyroxine  88 mcg Oral Q0600  . potassium chloride  10 mEq Oral BID    acetaminophen **OR** acetaminophen, albuterol, clorazepate, hydrALAZINE, polyethylene glycol

## 2020-10-28 NOTE — Discharge Summary (Signed)
Physician Discharge Summary  Ashley [name] HGD:[id number] DOB: 1945/11/13 DOA: 10/26/2020  PCP: Unk Pinto, MD  Admit date: 10/26/2020 Discharge date: 10/28/2020  Admitted From: Home Discharge disposition: Home   Code Status: Full Code  Diet Recommendation: Cardiac/diabetic diet  Discharge Diagnosis:   Principal Problem:   AKI (acute kidney injury) ([place]) Active Problems:   Essential hypertension   CKD stage 4 due to type 2 diabetes mellitus ([place])   Diabetes mellitus type 2, uncontrolled ([place])   Long term (current) use of anticoagulants  Chief Complaint  Patient presents with  . Abnormal Lab   Brief narrative: Ashley [name] is a 75 y.o. female with PMH significant for T2DM, HTN, DVT on Coumadin, CKD stage IV, lung cancer in remission, vulvar cancer in remission, COPD, GERD, hiatal hernia, chronic iron deficiency, anxiety/bipolar disorder who was recently hospitalized from 2/21-2/27 for abdominal pain due to constipation, relieved with MiraLAX.  Hospital course was also complicated by AKI, hypertensive urgency, ultimately discharged home. She followed up with her primary care provider on 3/10 who did a blood work that resulted today 3/11 showing hemoglobin low at 7.1 and hence directed the patient to the ED.  She has been on Coumadin for several months for DVT.  Denies any blood in stool but always has black stool because of iron pills.  In the ED, patient was afebrile, blood pressure 159/76, breathing on room air.  She has bilateral pitting 1+ pedal edema. Labs showed sodium 132, potassium 3.4, BUN/creatinine 35/3.65, WBC 5.9, hemoglobin 7.6 compared to 7.1 yesterday. Patient was admitted to hospitalist service. See below for details  Subjective: Patient was seen and examined this morning.  Pleasant elderly Caucasian female.  Propped up in bed.  Not in distress.   No new symptoms.  Feels ready to go home.  Assessment/Plan: Acute anemia -Anemia seems fairly recent.  Her  hemoglobin prior to last hospitalization was consistently over 10.  It was 9.3 at discharge from last hospitalization on 2/26.  Presented with hemoglobin of 7.6.  On Coumadin but no active bleeding.  FOBT negative.  Down to 6.5 next day 3/12.. 1 unit PRBC given with improvement in creatinine to 9.5 today. Recent Labs    10/10/20 0438 10/10/20 0447 10/11/20 0317 10/25/20 1556 10/26/20 1435 10/27/20 0507 10/27/20 2021 10/28/20 0540  HGB 11.7*  --    < > 7.1* 7.6* 6.5* 8.5* 9.5*  MCV 85.6  --    < > 93.4 94.8 93.7  --  92.0  VITAMINB12 1,461*  --   --   --  470  --   --   --   FOLATE  --  11.1  --   --  9.5  --   --   --   FERRITIN 180  --   --   --  152  --   --   --   TIBC 241*  --   --   --  295  --   --   --   IRON 138  --   --   --  76  --   --   --   RETICCTPCT  --  1.6  --   --  3.8*  --   --   --    < > = values in this interval not displayed.   Acute kidney injury superimposed on CKD stage IV, not on dialysis -Baseline creatinine 3-3.1.  During last hospitalization creatinine peaked at 4.59 and gradually improved back  to baseline.  At discharge, I held back on torsemide and chlorthalidone.  On this admission, patient with elevated creatinine of 3.65.  Nephrology consultation was obtained.  Patient was started on Bumex 2 mg daily 1 day prior to admission by Dr. [name].  He was continued here in the hospital.  She will be discharged on Bumex 1 mg daily as needed. -Continue to follow up with nephrology as an outpatient. -Renal ultrasound from 2/24 was unremarkable. Recent Labs    10/09/20 0924 10/10/20 0438 10/11/20 0317 10/12/20 0301 10/13/20 0311 10/14/20 0431 10/25/20 1556 10/26/20 1435 10/27/20 0507 10/28/20 0540  BUN 103* 78* 67* 63* 58* 49* 41* 35* 46* 51*  CREATININE 4.36* 3.66* 4.12* 3.67* 3.48* 3.01* 3.51* 3.65* 3.47* 3.88*   Essential hypertension -Continue, amlodipine, hydralazine and Bumex. -Continue to monitor blood pressure at home  History of DVT on  Coumadin -Chart reviewed.  Seen by Dr. Debara Pickett had an outpatient.  From the office note from September 2021, patient had nonresolving right femoral DVT with associated edema and failed half dose of Eliquis in the past.  Because of this, she was placed on Coumadin.  Repeat DVT scan this admission noted resolution of the previously noted thrombus.  Since there is no active bleeding, we will continue Coumadin.  Patient will follow up with Dr. Duffy Bruce as an outpatient for further determination. Recent Labs  Lab 10/26/20 1435 10/28/20 0540  INR 2.2* 2.0*   Chronic constipation -Last hospitalized was for severe abdominal pain related to constipation which resolved after she had a bowel movement with MiraLAX. -Continue bowel regimen with Senokot, MiraLAX as needed.  Hypokalemia -Chronically low potassium.  Continue supplement. Recent Labs  Lab 10/25/20 1556 10/26/20 1435 10/27/20 0507 10/28/20 0540  K 4.4 3.4* 3.3* 3.5   Uncontrolled type 2 diabetes mellitus  -A1c 8.6 on 10/10/2020 -At home, patient uses Novolin 15 units in the morning and 25 units in the night based on blood sugar.  I discussed with patient about switching her to Lantus and premeal sliding scale. Patient prefers to continue her previous regimen which she has been doing for the last several years.  Her husband helps her out. Recent Labs  Lab 10/27/20 0754 10/27/20 1127 10/27/20 1650 10/27/20 2018 10/28/20 0829  GLUCAP 94 155* 181* 231* 133*   COPD -Currently no wheezing,patient has stopped smoking last week. Declined nicotine patch.  Hypothyroidism -Continue Synthroid  GERD (gastroesophageal reflux disease) -Continue PPI  Peripheral neuropathy Depression -Continue antidepressants. Patient states her peripheral neuropathy meds are adjusted as an outpatient by psychiatrist. Continue to follow.   Wound care:    Discharge Exam:   Vitals:   10/27/20 1027 10/27/20 1515 10/27/20 2010 10/28/20 0535  BP: (!)  157/65 (!) 167/98 (!) 178/78 (!) 176/76  Pulse: 82  97 86  Resp: 18 17 20 18   Temp: 98.2 F (36.8 C) 98 F (36.7 C) 98.6 F (37 C) 97.7 F (36.5 C)  TempSrc: Oral Oral    SpO2: 99% 99% 97% 99%  Weight:      Height:        Body mass index is 27.57 kg/m.  General exam: Pleasant elderly Caucasian female.  Not in distress Skin: No rashes, lesions or ulcers. HEENT: Atraumatic, normocephalic, no obvious bleeding Lungs: Clear to auscultation bilaterally CVS: Regular rate and rhythm, no murmur GI/Abd soft, nontender, nondistended, bowel sound present CNS: Alert, awake, oriented x3 Psychiatry: Mood appropriate Extremities: Pedal edema improving bilaterally.  No calf tenderness  Follow ups:  Discharge Instructions    Increase activity slowly   Complete by: As directed       Follow-up Information    Unk Pinto, MD Follow up.   Specialty: Internal Medicine Contact information: [address] [place] [phone]               Recommendations for Outpatient Follow-Up:   1. Follow-up with PCP as an outpatient 2. Follow-up with nephrology as an outpatient  Discharge Instructions:  Follow with Primary MD Unk Pinto, MD in 7 days   Get CBC/BMP checked in next visit within 1 week by PCP or SNF MD ( we routinely change or add medications that can affect your baseline labs and fluid status, therefore we recommend that you get the mentioned basic workup next visit with your PCP, your PCP may decide not to get them or add new tests based on their clinical decision)  On your next visit with your PCP, please Get Medicines reviewed and adjusted.  Please request your PCP  to go over all Hospital Tests and Procedure/Radiological results at the follow up, please get all Hospital records sent to your Prim MD by signing hospital release before you go home.  Activity: As tolerated with Full fall precautions use walker/cane & assistance as  needed  For Heart failure patients - Check your Weight same time everyday, if you gain over 2 pounds, or you develop in leg swelling, experience more shortness of breath or chest pain, call your Primary MD immediately. Follow Cardiac Low Salt Diet and 1.5 lit/day fluid restriction.  If you have smoked or chewed Tobacco in the last 2 yrs please stop smoking, stop any regular Alcohol  and or any Recreational drug use.  If you experience worsening of your admission symptoms, develop shortness of breath, life threatening emergency, suicidal or homicidal thoughts you must seek medical attention immediately by calling 911 or calling your MD immediately  if symptoms less severe.  You Must read complete instructions/literature along with all the possible adverse reactions/side effects for all the Medicines you take and that have been prescribed to you. Take any new Medicines after you have completely understood and accpet all the possible adverse reactions/side effects.   Do not drive, operate heavy machinery, perform activities at heights, swimming or participation in water activities or provide baby sitting services if your were admitted for syncope or siezures until you have seen by Primary MD or a Neurologist and advised to do so again.  Do not drive when taking Pain medications.  Do not take more than prescribed Pain, Sleep and Anxiety Medications  Wear Seat belts while driving.   Please note You were cared for by a hospitalist during your hospital stay. If you have any questions about your discharge medications or the care you received while you were in the hospital after you are discharged, you can call the unit and asked to speak with the hospitalist on call if the hospitalist that took care of you is not available. Once you are discharged, your primary care physician will handle any further medical issues. Please note that NO REFILLS for any discharge medications will be authorized once you are  discharged, as it is imperative that you return to your primary care physician (or establish a relationship with a primary care physician if you do not have one) for your aftercare needs so that they can reassess your need for medications and monitor your lab values.    Allergies as of 10/28/2020  Reactions   Sulfa Antibiotics Other (See Comments)   Pt states she had "extreme pain"   Sulfacetamide Sodium Other (See Comments)   Pain      Medication List    TAKE these medications   acetaminophen 500 MG tablet Commonly known as: TYLENOL Take 1,000 mg by mouth every 6 (six) hours as needed for mild pain or headache.   amLODipine 10 MG tablet Commonly known as: NORVASC Take 1 tablet (10 mg total) by mouth daily.   aspirin EC 81 MG tablet Take 81 mg by mouth daily.   atorvastatin 40 MG tablet Commonly known as: LIPITOR TAKE 1 TABLET BY MOUTH EVERY DAY FOR CHOLESTEROL What changed:   how much to take  how to take this  when to take this  additional instructions   azelastine 0.1 % nasal spray Commonly known as: ASTELIN PLACE 2 SPRAYS INTO BOTH NOSTRILS 2 (TWO) TIMES DAILY. USE IN EACH NOSTRIL AS DIRECTED What changed:   when to take this  reasons to take this  additional instructions   bumetanide 1 MG tablet Commonly known as: BUMEX Take 1 tablet (1 mg total) by mouth daily as needed. What changed:   when to take this  reasons to take this  additional instructions   clorazepate 7.5 MG tablet Commonly known as: Tranxene-T Take    1/2 to 1 tablet    1 or 2 x /day    ONLY if needed for  Nerves What changed:   how much to take  how to take this  when to take this  reasons to take this  additional instructions   famotidine 20 MG tablet Commonly known as: PEPCID TAKE 1 TABLET TWICE A DAY WITH MEALS FOR INDIGESTION. What changed:   how much to take  how to take this  when to take this  additional instructions   fenofibrate 145 MG  tablet Commonly known as: TRICOR TAKE 1 TABLET DAILY FOR TRIGLYCERIDES (BLOOD FATS) What changed: See the new instructions.   ferrous sulfate 325 (65 FE) MG tablet TAKE 1 TABLET 2 TIMES A DAY WITH A MEAL FOR IRON DEFICIENCY   FLUoxetine 20 MG capsule Commonly known as: PROZAC Take 20 mg by mouth daily.   hydrALAZINE 100 MG tablet Commonly known as: APRESOLINE Take 1 tablet (100 mg total) by mouth every 8 (eight) hours.   levothyroxine 88 MCG tablet Commonly known as: SYNTHROID Take   1 tablet    Daily   on an empty stomach with only water for 30 minutes & no Antacid meds, Calcium or Magnesium for 4 hours & avoid Biotin What changed:   how much to take  how to take this  when to take this  additional instructions   multivitamin with minerals tablet Take 1 tablet by mouth daily.   NovoLIN N 100 UNIT/ML injection Generic drug: insulin NPH Human INJECT 50 UNITS EVERY MORNING AND 25 UNITS EVERY EVENING What changed: See the new instructions.   nystatin cream Commonly known as: MYCOSTATIN Apply 1 application topically daily as needed for dry skin.   OneTouch Ultra test strip Generic drug: glucose blood TEST BLOOD SUGAR 3 TIMES A DAY   polyethylene glycol 17 g packet Commonly known as: MIRALAX / GLYCOLAX Take 17 g by mouth daily. What changed:   when to take this  reasons to take this   potassium chloride 10 MEQ tablet Commonly known as: Klor-Con M10 TAKE 1 TABLET 3 X /DAY WITH MEALS FOR POTASSIUM What changed:  how much to take  how to take this  when to take this  additional instructions   vitamin C 500 MG tablet Commonly known as: ASCORBIC ACID Take 500 mg by mouth daily. Take with iron/ferrous sulfate   Vitamin D 125 MCG (5000 UT) Caps Take 1 capsule by mouth in the morning and at bedtime.   warfarin 5 MG tablet Commonly known as: COUMADIN Take as directed. If you are unsure how to take this medication, talk to your nurse or doctor. Original  instructions: TAKE 1 TABLET BY MOUTH EVERY DAY What changed:   how much to take  when to take this  additional instructions       Time coordinating discharge: 35 minutes  The results of significant diagnostics from this hospitalization (including imaging, microbiology, ancillary and laboratory) are listed below for reference.    Procedures and Diagnostic Studies:   VAS [place] LOWER EXTREMITY VENOUS (DVT)  Result Date: 10/27/2020  Lower Venous DVT Study Indications: Hx of DVT.  Risk Factors: Cancer Hx lung cancer DVT 2021 chronic. Anticoagulation: Coumadin. Comparison Study: 2021 RLE chronic FV DVT Performing Technologist: Vonzell Schlatter RVT  Examination Guidelines: A complete evaluation includes B-mode imaging, spectral Doppler, color Doppler, and power Doppler as needed of all accessible portions of each vessel. Bilateral testing is considered an integral part of a complete examination. Limited examinations for reoccurring indications may be performed as noted. The reflux portion of the exam is performed with the patient in reverse Trendelenburg.  +---------+---------------+---------+-----------+----------+--------------+ RIGHT    CompressibilityPhasicitySpontaneityPropertiesThrombus Aging +---------+---------------+---------+-----------+----------+--------------+ CFV      Full           Yes      Yes                                 +---------+---------------+---------+-----------+----------+--------------+ SFJ      Full                                                        +---------+---------------+---------+-----------+----------+--------------+ FV Prox  Full                                                        +---------+---------------+---------+-----------+----------+--------------+ FV Mid   Full                                                        +---------+---------------+---------+-----------+----------+--------------+ FV DistalFull                                                         +---------+---------------+---------+-----------+----------+--------------+ PFV      Full                                                        +---------+---------------+---------+-----------+----------+--------------+  POP      Full           Yes      Yes                                 +---------+---------------+---------+-----------+----------+--------------+ PTV      Full                                                        +---------+---------------+---------+-----------+----------+--------------+ PERO     Full                                                        +---------+---------------+---------+-----------+----------+--------------+   +---------+---------------+---------+-----------+----------+--------------+ LEFT     CompressibilityPhasicitySpontaneityPropertiesThrombus Aging +---------+---------------+---------+-----------+----------+--------------+ CFV      Full           Yes      Yes                                 +---------+---------------+---------+-----------+----------+--------------+ SFJ      Full                                                        +---------+---------------+---------+-----------+----------+--------------+ FV Prox  Full                                                        +---------+---------------+---------+-----------+----------+--------------+ FV Mid   Full                                                        +---------+---------------+---------+-----------+----------+--------------+ FV DistalFull                                                        +---------+---------------+---------+-----------+----------+--------------+ PFV      Full                                                        +---------+---------------+---------+-----------+----------+--------------+ POP      Full           Yes      Yes                                  +---------+---------------+---------+-----------+----------+--------------+  PTV      Full                                                        +---------+---------------+---------+-----------+----------+--------------+ PERO     Full                                                        +---------+---------------+---------+-----------+----------+--------------+     Summary: BILATERAL: - No evidence of deep vein thrombosis seen in the lower extremities, bilaterally. -No evidence of popliteal cyst, bilaterally. RIGHT: - Findings suggest resolution of previously noted thrombus.   *See table(s) above for measurements and observations. Electronically signed by Harold Barban MD on 10/27/2020 at 4:17:14 PM.    Final      Labs:   Basic Metabolic Panel: Recent Labs  Lab 10/25/20 1556 10/26/20 1435 10/27/20 0507 10/28/20 0540  NA 133* 132* 135 134*  K 4.4 3.4* 3.3* 3.5  CL 102 101 102 101  CO2 22 20* 23 22  GLUCOSE 197* 167* 71 83  BUN 41* 35* 46* 51*  CREATININE 3.51* 3.65* 3.47* 3.88*  CALCIUM 8.4* 8.8* 8.8* 8.8*   GFR Estimated Creatinine Clearance: 12.2 mL/min (A) (by C-G formula based on SCr of 3.88 mg/dL (H)). Liver Function Tests: Recent Labs  Lab 10/25/20 1556 10/26/20 1435  AST 18 23  ALT 18 23  ALKPHOS  --  48  BILITOT 0.4 1.1  PROT 5.1* 5.7*  ALBUMIN  --  2.8*   No results for input(s): LIPASE, AMYLASE in the last 168 hours. No results for input(s): AMMONIA in the last 168 hours. Coagulation profile Recent Labs  Lab 10/26/20 1435 10/28/20 0540  INR 2.2* 2.0*    CBC: Recent Labs  Lab 10/25/20 1556 10/26/20 1435 10/27/20 0507 10/27/20 2021 10/28/20 0540  WBC 5.3 5.9 4.5  --  6.0  NEUTROABS 3,980  --   --   --   --   HGB 7.1* 7.6* 6.5* 8.5* 9.5*  HCT 22.5* 23.8* 20.9* 26.6* 28.9*  MCV 93.4 94.8 93.7  --  92.0  PLT 378 352 359  --  397   Cardiac Enzymes: No results for input(s): CKTOTAL, CKMB, CKMBINDEX, TROPONINI in the last 168  hours. BNP: Invalid input(s): POCBNP CBG: Recent Labs  Lab 10/27/20 0754 10/27/20 1127 10/27/20 1650 10/27/20 2018 10/28/20 0829  GLUCAP 94 155* 181* 231* 133*   D-Dimer No results for input(s): DDIMER in the last 72 hours. Hgb A1c No results for input(s): HGBA1C in the last 72 hours. Lipid Profile No results for input(s): CHOL, HDL, LDLCALC, TRIG, CHOLHDL, LDLDIRECT in the last 72 hours. Thyroid function studies No results for input(s): TSH, T4TOTAL, T3FREE, THYROIDAB in the last 72 hours.  Invalid input(s): FREET3 Anemia work up Recent Labs    10/26/20 1435  VITAMINB12 470  FOLATE 9.5  FERRITIN 152  TIBC 295  IRON 76  RETICCTPCT 3.8*   Microbiology Recent Results (from the past 240 hour(s))  SARS CORONAVIRUS 2 (TAT 6-24 HRS) Nasopharyngeal Nasopharyngeal Swab     Status: None   Collection Time: 10/26/20  6:48 PM   Specimen: Nasopharyngeal Swab  Result Value Ref  Range Status   SARS Coronavirus 2 NEGATIVE NEGATIVE Final    Comment: (NOTE) SARS-CoV-2 target nucleic acids are NOT DETECTED.  The SARS-CoV-2 RNA is generally detectable in upper and lower respiratory specimens during the acute phase of infection. Negative results do not preclude SARS-CoV-2 infection, do not rule out co-infections with other pathogens, and should not be used as the sole basis for treatment or other patient management decisions. Negative results must be combined with clinical observations, patient history, and epidemiological information. The expected result is Negative.  Fact Sheet for Patients: SugarRoll.be  Fact Sheet for Healthcare Providers: https://www.woods-mathews.com/  This test is not yet approved or cleared by the [place] FDA and  has been authorized for detection and/or diagnosis of SARS-CoV-2 by FDA under an Emergency Use Authorization (EUA). This EUA will remain  in effect (meaning this test can be used) for the duration  of the COVID-19 declaration under Se ction 564(b)(1) of the Act, 21 U.S.C. section 360bbb-3(b)(1), unless the authorization is terminated or revoked sooner.  Performed at [facility], [place] [address], [place], [place] [address]      Signed: Terrilee Croak  [facility] 10/28/2020, 10:33 AM

## 2020-10-29 ENCOUNTER — Telehealth: Payer: Self-pay | Admitting: Internal Medicine

## 2020-10-29 NOTE — Telephone Encounter (Signed)
inr 2.2 in the hospital She checks inr at home  Advised that MD would like her to continue warfarin until next visit  She does not want to wait until April visit with PA  Added her on to DOD spot with Dr. Debara Pickett on 11/05/20

## 2020-10-29 NOTE — Telephone Encounter (Signed)
Yes .. continue warfarin until she sees Angie. They can discuss switching  Dr Lemmie Evens

## 2020-10-29 NOTE — Telephone Encounter (Signed)
Can d/w Angie at follow-up in April. No medical reason to be on warfarin - she requested it b/c her husband was on it many years ago - I think a DOAC would be fine.  Dr Lemmie Evens

## 2020-10-29 NOTE — Telephone Encounter (Signed)
From 04/2020 note: initially treated on Eliquis by her PCP and given a starter pack however currently is on 2.5 mg twice daily which is subtherapeutic.  Should she continue warfarin until her next visit with Angie PA?

## 2020-10-29 NOTE — Telephone Encounter (Signed)
Pt c/o medication issue:  1. Name of Medication: warfarin (COUMADIN) 5 MG tablet  2. How are you currently taking this medication (dosage and times per day)? As prescribed   3. Are you having a reaction (difficulty breathing--STAT)? No   4. What is your medication issue? Ashley [name] is calling wanting to discuss coming off of warfarin. She states a Doctor at the hospital took her off of it, but then decided to put her back on it stating he wanted Dr. Debara Pickett to decide if she should be on it or not. [name] is scheduled for a hospital f/u on 11/26/20 with Fabian Sharp. Please advise.

## 2020-10-29 NOTE — Telephone Encounter (Signed)
Routed to MD to review and advise on warfarin

## 2020-10-30 ENCOUNTER — Ambulatory Visit (INDEPENDENT_AMBULATORY_CARE_PROVIDER_SITE_OTHER): Payer: HMO | Admitting: Cardiovascular Disease

## 2020-10-30 ENCOUNTER — Telehealth: Payer: Self-pay | Admitting: *Deleted

## 2020-10-30 DIAGNOSIS — I82411 Acute embolism and thrombosis of right femoral vein: Secondary | ICD-10-CM | POA: Diagnosis not present

## 2020-10-30 DIAGNOSIS — Z7901 Long term (current) use of anticoagulants: Secondary | ICD-10-CM

## 2020-10-30 LAB — POCT INR: INR: 1.7 — AB (ref 2.0–3.0)

## 2020-10-30 NOTE — Telephone Encounter (Signed)
Called patient on 10/30/2020 , 3:39 PM in an attempt to reach the patient for a hospital follow up. Spoke with patient, who said she felt much better. Admit date: 10/26/20 Discharge: 10/28/20   She does not have any questions or concerns about medications from the hospital admission. The patient's medications were reviewed over the phone, they were counseled to bring in all current medications to the hospital follow up visit.   I advised the patient to call if any questions or concerns arise about the hospital admission or medications. Patient understood medication change to Bumex and states her legs and ankles are much less swollen, but feet continue to be swollen.   Home health was not started in the hospital.  All questions were answered and a follow up appointment was made. Patient has a follow up appointment on 11/11/2020 with Garnet Sierras, NP.  Prior to Admission medications   Medication Sig Start Date End Date Taking? Authorizing Provider  acetaminophen (TYLENOL) 500 MG tablet Take 1,000 mg by mouth every 6 (six) hours as needed for mild pain or headache.    [provider]  amLODipine (NORVASC) 10 MG tablet Take 1 tablet (10 mg total) by mouth daily. 07/04/19   Cristal Deer, MD  aspirin EC 81 MG tablet Take 81 mg by mouth daily.    [provider]  atorvastatin (LIPITOR) 40 MG tablet TAKE 1 TABLET BY MOUTH EVERY DAY FOR CHOLESTEROL Patient taking differently: Take 40 mg by mouth daily. 09/25/20   Liane Comber, NP  azelastine (ASTELIN) 0.1 % nasal spray PLACE 2 SPRAYS INTO BOTH NOSTRILS 2 (TWO) TIMES DAILY. USE IN EACH NOSTRIL AS DIRECTED Patient taking differently: Place 2 sprays into both nostrils 2 (two) times daily as needed for allergies. 05/29/20   Unk Pinto, MD  bumetanide (BUMEX) 1 MG tablet Take 1 tablet (1 mg total) by mouth daily as needed. 10/28/20 01/26/21  Terrilee Croak, MD  Cholecalciferol (VITAMIN D) 125 MCG (5000 UT) CAPS Take 1 capsule by  mouth in the morning and at bedtime.    [provider]  clorazepate (TRANXENE-T) 7.5 MG tablet Take    1/2 to 1 tablet    1 or 2 x /day    ONLY if needed for  Nerves Patient taking differently: Take 7.5 mg by mouth 2 (two) times daily as needed for anxiety or sleep. 05/22/20   Unk Pinto, MD  famotidine (PEPCID) 20 MG tablet TAKE 1 TABLET TWICE A DAY WITH MEALS FOR INDIGESTION. Patient taking differently: Take 20 mg by mouth 2 (two) times daily. 11/30/19   Unk Pinto, MD  fenofibrate (TRICOR) 145 MG tablet TAKE 1 TABLET DAILY FOR TRIGLYCERIDES (BLOOD FATS) Patient taking differently: Take 145 mg by mouth daily. 08/22/20   Unk Pinto, MD  ferrous sulfate 325 (65 FE) MG tablet TAKE 1 TABLET 2 TIMES A DAY WITH A MEAL FOR IRON DEFICIENCY 10/10/20   Garnet Sierras, NP  FLUoxetine (PROZAC) 20 MG capsule Take 20 mg by mouth daily.    [provider]  hydrALAZINE (APRESOLINE) 100 MG tablet Take 1 tablet (100 mg total) by mouth every 8 (eight) hours. 10/14/20 01/12/21  Terrilee Croak, MD  levothyroxine (SYNTHROID) 88 MCG tablet Take   1 tablet    Daily   on an empty stomach with only water for 30 minutes & no Antacid meds, Calcium or Magnesium for 4 hours & avoid Biotin Patient taking differently: Take 88 mcg by mouth daily before breakfast. 09/05/20   Unk Pinto,  MD  Multiple Vitamins-Minerals (MULTIVITAMIN WITH MINERALS) tablet Take 1 tablet by mouth daily.    [provider]  NOVOLIN N 100 UNIT/ML injection INJECT 50 UNITS EVERY MORNING AND 25 UNITS EVERY EVENING Patient taking differently: Inject 25-50 Units into the skin 3 (three) times daily as needed (high blood sugar). Injects 25-50 units into the skin after checking CBG daily (CBG 160-299 mg/dL: 25 units; CBG >300 mg/dl: 50 units); gives extra 25 units in the evening if taking a big meal 05/09/20   Unk Pinto, MD  nystatin cream (MYCOSTATIN) Apply 1 application topically daily as needed for dry skin.  10/17/20   [provider]  [facility] ULTRA test strip TEST BLOOD SUGAR 3 TIMES A DAY 01/27/20   Vicie Mutters R, PA-C  polyethylene glycol (MIRALAX / GLYCOLAX) 17 g packet Take 17 g by mouth daily. Patient taking differently: Take 17 g by mouth daily as needed for mild constipation. 03/06/20   Vladimir Crofts, PA-C  potassium chloride (KLOR-CON M10) 10 MEQ tablet TAKE 1 TABLET 3 X /DAY WITH MEALS FOR POTASSIUM Patient taking differently: Take 10 mEq by mouth 3 (three) times daily. 01/08/20   Unk Pinto, MD  vitamin C (ASCORBIC ACID) 500 MG tablet Take 500 mg by mouth daily. Take with iron/ferrous sulfate    [provider]  warfarin (COUMADIN) 5 MG tablet TAKE 1 TABLET BY MOUTH EVERY DAY Patient taking differently: Take 2.5-5 mg by mouth See admin instructions. Takes 1 tablet (5 mg totally) by mouth every day; Except 0.5 tablet (2.5 mg totally) on Monday 07/06/20   Hilty, Nadean Corwin, MD

## 2020-10-30 NOTE — Patient Instructions (Signed)
Description   Had to hold warfarin while in hospital.  Take 7.5mg  tonight and then continue with 1 tablet daily (5mg ) except 1/2 tablet (2.5mg ) each Monday.

## 2020-11-05 ENCOUNTER — Ambulatory Visit: Payer: HMO | Admitting: Internal Medicine

## 2020-11-05 ENCOUNTER — Telehealth: Payer: Self-pay | Admitting: Internal Medicine

## 2020-11-05 NOTE — Telephone Encounter (Signed)
Patient called to cancel appts for today because she not feeling well. Patients states she threw up this morning and unable to stand on her left leg, she states its been hurting for over a week. Please advise

## 2020-11-05 NOTE — Telephone Encounter (Signed)
Will route to primary nurse to make aware of cancelled appointments for today.

## 2020-11-07 ENCOUNTER — Encounter (HOSPITAL_COMMUNITY): Payer: Self-pay | Admitting: Internal Medicine

## 2020-11-07 ENCOUNTER — Emergency Department (HOSPITAL_COMMUNITY): Payer: HMO

## 2020-11-07 ENCOUNTER — Other Ambulatory Visit: Payer: Self-pay

## 2020-11-07 ENCOUNTER — Inpatient Hospital Stay (HOSPITAL_COMMUNITY)
Admission: EM | Admit: 2020-11-07 | Discharge: 2020-11-09 | DRG: 605 | Disposition: A | Discharge status: Home / Self Care | Payer: HMO | Attending: Internal Medicine | Admitting: Internal Medicine

## 2020-11-07 DIAGNOSIS — N184 Chronic kidney disease, stage 4 (severe): Secondary | ICD-10-CM

## 2020-11-07 DIAGNOSIS — R791 Abnormal coagulation profile: Secondary | ICD-10-CM

## 2020-11-07 DIAGNOSIS — Z85118 Personal history of other malignant neoplasm of bronchus and lung: Secondary | ICD-10-CM | POA: Diagnosis not present

## 2020-11-07 DIAGNOSIS — E611 Iron deficiency: Secondary | ICD-10-CM | POA: Diagnosis present

## 2020-11-07 DIAGNOSIS — F17210 Nicotine dependence, cigarettes, uncomplicated: Secondary | ICD-10-CM | POA: Diagnosis present

## 2020-11-07 DIAGNOSIS — J449 Chronic obstructive pulmonary disease, unspecified: Secondary | ICD-10-CM | POA: Diagnosis present

## 2020-11-07 DIAGNOSIS — Z794 Long term (current) use of insulin: Secondary | ICD-10-CM

## 2020-11-07 DIAGNOSIS — K5909 Other constipation: Secondary | ICD-10-CM | POA: Diagnosis present

## 2020-11-07 DIAGNOSIS — I129 Hypertensive chronic kidney disease with stage 1 through stage 4 chronic kidney disease, or unspecified chronic kidney disease: Secondary | ICD-10-CM | POA: Diagnosis present

## 2020-11-07 DIAGNOSIS — I10 Essential (primary) hypertension: Secondary | ICD-10-CM | POA: Diagnosis not present

## 2020-11-07 DIAGNOSIS — N3080 Other cystitis without hematuria: Secondary | ICD-10-CM | POA: Diagnosis present

## 2020-11-07 DIAGNOSIS — M1712 Unilateral primary osteoarthritis, left knee: Secondary | ICD-10-CM | POA: Diagnosis present

## 2020-11-07 DIAGNOSIS — Z20822 Contact with and (suspected) exposure to covid-19: Secondary | ICD-10-CM | POA: Diagnosis present

## 2020-11-07 DIAGNOSIS — Z8673 Personal history of transient ischemic attack (TIA), and cerebral infarction without residual deficits: Secondary | ICD-10-CM

## 2020-11-07 DIAGNOSIS — F429 Obsessive-compulsive disorder, unspecified: Secondary | ICD-10-CM | POA: Diagnosis present

## 2020-11-07 DIAGNOSIS — S7012XA Contusion of left thigh, initial encounter: Principal | ICD-10-CM

## 2020-11-07 DIAGNOSIS — E1122 Type 2 diabetes mellitus with diabetic chronic kidney disease: Secondary | ICD-10-CM

## 2020-11-07 DIAGNOSIS — E1142 Type 2 diabetes mellitus with diabetic polyneuropathy: Secondary | ICD-10-CM | POA: Diagnosis present

## 2020-11-07 DIAGNOSIS — Z86718 Personal history of other venous thrombosis and embolism: Secondary | ICD-10-CM | POA: Diagnosis not present

## 2020-11-07 DIAGNOSIS — D649 Anemia, unspecified: Secondary | ICD-10-CM

## 2020-11-07 DIAGNOSIS — E1165 Type 2 diabetes mellitus with hyperglycemia: Secondary | ICD-10-CM | POA: Diagnosis present

## 2020-11-07 DIAGNOSIS — M7989 Other specified soft tissue disorders: Secondary | ICD-10-CM | POA: Diagnosis not present

## 2020-11-07 DIAGNOSIS — F419 Anxiety disorder, unspecified: Secondary | ICD-10-CM | POA: Diagnosis present

## 2020-11-07 DIAGNOSIS — E039 Hypothyroidism, unspecified: Secondary | ICD-10-CM | POA: Diagnosis present

## 2020-11-07 DIAGNOSIS — K219 Gastro-esophageal reflux disease without esophagitis: Secondary | ICD-10-CM | POA: Diagnosis present

## 2020-11-07 DIAGNOSIS — N189 Chronic kidney disease, unspecified: Secondary | ICD-10-CM

## 2020-11-07 DIAGNOSIS — Z79899 Other long term (current) drug therapy: Secondary | ICD-10-CM

## 2020-11-07 DIAGNOSIS — Z7982 Long term (current) use of aspirin: Secondary | ICD-10-CM

## 2020-11-07 DIAGNOSIS — E876 Hypokalemia: Secondary | ICD-10-CM | POA: Diagnosis present

## 2020-11-07 DIAGNOSIS — Z7901 Long term (current) use of anticoagulants: Secondary | ICD-10-CM

## 2020-11-07 DIAGNOSIS — Z793 Long term (current) use of hormonal contraceptives: Secondary | ICD-10-CM

## 2020-11-07 DIAGNOSIS — Z8249 Family history of ischemic heart disease and other diseases of the circulatory system: Secondary | ICD-10-CM

## 2020-11-07 DIAGNOSIS — Z8544 Personal history of malignant neoplasm of other female genital organs: Secondary | ICD-10-CM | POA: Diagnosis not present

## 2020-11-07 DIAGNOSIS — E7800 Pure hypercholesterolemia, unspecified: Secondary | ICD-10-CM | POA: Diagnosis present

## 2020-11-07 DIAGNOSIS — Z882 Allergy status to sulfonamides status: Secondary | ICD-10-CM

## 2020-11-07 DIAGNOSIS — M79605 Pain in left leg: Secondary | ICD-10-CM

## 2020-11-07 DIAGNOSIS — F319 Bipolar disorder, unspecified: Secondary | ICD-10-CM | POA: Diagnosis present

## 2020-11-07 DIAGNOSIS — E871 Hypo-osmolality and hyponatremia: Secondary | ICD-10-CM | POA: Diagnosis present

## 2020-11-07 DIAGNOSIS — M47816 Spondylosis without myelopathy or radiculopathy, lumbar region: Secondary | ICD-10-CM | POA: Diagnosis present

## 2020-11-07 DIAGNOSIS — Z823 Family history of stroke: Secondary | ICD-10-CM

## 2020-11-07 DIAGNOSIS — M109 Gout, unspecified: Secondary | ICD-10-CM | POA: Diagnosis present

## 2020-11-07 DIAGNOSIS — Z90710 Acquired absence of both cervix and uterus: Secondary | ICD-10-CM

## 2020-11-07 DIAGNOSIS — W19XXXA Unspecified fall, initial encounter: Secondary | ICD-10-CM | POA: Diagnosis present

## 2020-11-07 DIAGNOSIS — E785 Hyperlipidemia, unspecified: Secondary | ICD-10-CM | POA: Diagnosis present

## 2020-11-07 DIAGNOSIS — Z83438 Family history of other disorder of lipoprotein metabolism and other lipidemia: Secondary | ICD-10-CM

## 2020-11-07 LAB — CBC WITH DIFFERENTIAL/PLATELET
Abs Immature Granulocytes: 0.22 10*3/uL — ABNORMAL HIGH (ref 0.00–0.07)
Basophils Absolute: 0 10*3/uL (ref 0.0–0.1)
Basophils Relative: 0 %
Eosinophils Absolute: 0 10*3/uL (ref 0.0–0.5)
Eosinophils Relative: 0 %
HCT: 17 % — ABNORMAL LOW (ref 36.0–46.0)
Hemoglobin: 5.3 g/dL — CL (ref 12.0–15.0)
Immature Granulocytes: 2 %
Lymphocytes Relative: 8 %
Lymphs Abs: 1.1 10*3/uL (ref 0.7–4.0)
MCH: 30.5 pg (ref 26.0–34.0)
MCHC: 31.2 g/dL (ref 30.0–36.0)
MCV: 97.7 fL (ref 80.0–100.0)
Monocytes Absolute: 1 10*3/uL (ref 0.1–1.0)
Monocytes Relative: 8 %
Neutro Abs: 10.7 10*3/uL — ABNORMAL HIGH (ref 1.7–7.7)
Neutrophils Relative %: 82 %
Platelets: 400 10*3/uL (ref 150–400)
RBC: 1.74 MIL/uL — ABNORMAL LOW (ref 3.87–5.11)
RDW: 15.4 % (ref 11.5–15.5)
WBC: 13 10*3/uL — ABNORMAL HIGH (ref 4.0–10.5)
nRBC: 0.4 % — ABNORMAL HIGH (ref 0.0–0.2)

## 2020-11-07 LAB — COMPREHENSIVE METABOLIC PANEL
ALT: 18 U/L (ref 0–44)
AST: 34 U/L (ref 15–41)
Albumin: 2.9 g/dL — ABNORMAL LOW (ref 3.5–5.0)
Alkaline Phosphatase: 44 U/L (ref 38–126)
Anion gap: 13 (ref 5–15)
BUN: 62 mg/dL — ABNORMAL HIGH (ref 8–23)
CO2: 17 mmol/L — ABNORMAL LOW (ref 22–32)
Calcium: 8.6 mg/dL — ABNORMAL LOW (ref 8.9–10.3)
Chloride: 98 mmol/L (ref 98–111)
Creatinine, Ser: 4.05 mg/dL — ABNORMAL HIGH (ref 0.44–1.00)
GFR, Estimated: 11 mL/min — ABNORMAL LOW (ref >60)
Glucose, Bld: 101 mg/dL — ABNORMAL HIGH (ref 70–99)
Potassium: 4.2 mmol/L (ref 3.5–5.1)
Sodium: 128 mmol/L — ABNORMAL LOW (ref 135–145)
Total Bilirubin: 1 mg/dL (ref 0.3–1.2)
Total Protein: 5.7 g/dL — ABNORMAL LOW (ref 6.5–8.1)

## 2020-11-07 LAB — PREPARE RBC (CROSSMATCH)

## 2020-11-07 LAB — PROTIME-INR
INR: 8.2 (ref 0.8–1.2)
Prothrombin Time: 66 seconds — ABNORMAL HIGH (ref 11.4–15.2)

## 2020-11-07 LAB — URINALYSIS, ROUTINE W REFLEX MICROSCOPIC
Bilirubin Urine: NEGATIVE
Glucose, UA: NEGATIVE mg/dL
Ketones, ur: NEGATIVE mg/dL
Nitrite: NEGATIVE
Protein, ur: 100 mg/dL — AB
Specific Gravity, Urine: 1.013 (ref 1.005–1.030)
WBC, UA: >50 WBC/hpf — ABNORMAL HIGH (ref 0–5)
pH: 7 (ref 5.0–8.0)

## 2020-11-07 LAB — SARS CORONAVIRUS 2 (TAT 6-24 HRS): SARS Coronavirus 2: NEGATIVE

## 2020-11-07 LAB — HEMOGLOBIN AND HEMATOCRIT, BLOOD
HCT: 15.5 % — ABNORMAL LOW (ref 36.0–46.0)
Hemoglobin: 4.9 g/dL — CL (ref 12.0–15.0)

## 2020-11-07 LAB — POC OCCULT BLOOD, ED: Fecal Occult Bld: NEGATIVE

## 2020-11-07 MED ORDER — LEVOTHYROXINE SODIUM 88 MCG PO TABS
88.0000 ug | ORAL_TABLET | Freq: Every day | ORAL | Status: DC
  Administered 2020-11-08 – 2020-11-09 (×2): 88 ug via ORAL
  Filled 2020-11-07 (×2): qty 1

## 2020-11-07 MED ORDER — VITAMIN K1 10 MG/ML IJ SOLN
1.0000 mg | Freq: Once | INTRAVENOUS | Status: DC
  Filled 2020-11-07: qty 0.1

## 2020-11-07 MED ORDER — HYDROMORPHONE HCL 1 MG/ML IJ SOLN
0.5000 mg | INTRAMUSCULAR | Status: DC | PRN
Start: 2020-11-07 — End: 2020-11-09
  Administered 2020-11-08: 0.5 mg via INTRAVENOUS
  Filled 2020-11-07: qty 0.5

## 2020-11-07 MED ORDER — SODIUM CHLORIDE 0.9% FLUSH
3.0000 mL | Freq: Two times a day (BID) | INTRAVENOUS | Status: DC
  Administered 2020-11-07 – 2020-11-09 (×5): 3 mL via INTRAVENOUS

## 2020-11-07 MED ORDER — PROTHROMBIN COMPLEX CONC HUMAN 500 UNITS IV KIT
2120.0000 [IU] | PACK | Status: AC
  Administered 2020-11-07: 2120 [IU] via INTRAVENOUS
  Filled 2020-11-07: qty 2120

## 2020-11-07 MED ORDER — FERROUS SULFATE 325 (65 FE) MG PO TABS
325.0000 mg | ORAL_TABLET | Freq: Two times a day (BID) | ORAL | Status: DC
  Administered 2020-11-08 – 2020-11-09 (×3): 325 mg via ORAL
  Filled 2020-11-07 (×3): qty 1

## 2020-11-07 MED ORDER — VITAMIN K1 10 MG/ML IJ SOLN
10.0000 mg | INTRAVENOUS | Status: AC
  Administered 2020-11-07: 10 mg via INTRAVENOUS
  Filled 2020-11-07: qty 1

## 2020-11-07 MED ORDER — SODIUM CHLORIDE 0.9 % IV SOLN: INTRAVENOUS | Status: AC

## 2020-11-07 MED ORDER — ATORVASTATIN CALCIUM 40 MG PO TABS
40.0000 mg | ORAL_TABLET | Freq: Every day | ORAL | Status: DC
  Administered 2020-11-08 – 2020-11-09 (×2): 40 mg via ORAL
  Filled 2020-11-07 (×2): qty 1

## 2020-11-07 MED ORDER — SODIUM CHLORIDE 0.9 % IV SOLN: 10.0000 mL/h | Freq: Once | INTRAVENOUS | Status: DC

## 2020-11-07 MED ORDER — ACETAMINOPHEN 650 MG RE SUPP: 650.0000 mg | Freq: Four times a day (QID) | RECTAL | Status: DC | PRN

## 2020-11-07 MED ORDER — ACETAMINOPHEN 325 MG PO TABS: 650.0000 mg | ORAL_TABLET | Freq: Four times a day (QID) | ORAL | Status: DC | PRN

## 2020-11-07 MED ORDER — FLUOXETINE HCL 20 MG PO CAPS
20.0000 mg | ORAL_CAPSULE | Freq: Every day | ORAL | Status: DC
Start: 2020-11-08 — End: 2020-11-09
  Administered 2020-11-08 – 2020-11-09 (×2): 20 mg via ORAL
  Filled 2020-11-07 (×2): qty 1

## 2020-11-07 MED ORDER — FENOFIBRATE 160 MG PO TABS
160.0000 mg | ORAL_TABLET | Freq: Every day | ORAL | Status: DC
  Administered 2020-11-08 – 2020-11-09 (×2): 160 mg via ORAL
  Filled 2020-11-07 (×2): qty 1

## 2020-11-07 MED ORDER — HYDRALAZINE HCL 20 MG/ML IJ SOLN: 10.0000 mg | Freq: Three times a day (TID) | INTRAMUSCULAR | Status: DC | PRN

## 2020-11-07 MED ORDER — HYDROCODONE-ACETAMINOPHEN 5-325 MG PO TABS
1.0000 | ORAL_TABLET | ORAL | Status: DC | PRN
Start: 2020-11-07 — End: 2020-11-09
  Administered 2020-11-07 – 2020-11-08 (×2): 1 via ORAL
  Filled 2020-11-07 (×2): qty 1

## 2020-11-07 MED ORDER — AMOXICILLIN-POT CLAVULANATE 875-125 MG PO TABS: 1.0000 | ORAL_TABLET | Freq: Two times a day (BID) | ORAL | Status: DC

## 2020-11-07 MED ORDER — AMOXICILLIN-POT CLAVULANATE 500-125 MG PO TABS
1.0000 | ORAL_TABLET | Freq: Two times a day (BID) | ORAL | Status: DC
  Administered 2020-11-08 – 2020-11-09 (×2): 500 mg via ORAL
  Filled 2020-11-07 (×5): qty 1

## 2020-11-07 MED ORDER — POLYETHYLENE GLYCOL 3350 17 G PO PACK
17.0000 g | PACK | Freq: Every day | ORAL | Status: DC | PRN
Start: 2020-11-07 — End: 2020-11-09

## 2020-11-07 MED ORDER — FAMOTIDINE 20 MG PO TABS
20.0000 mg | ORAL_TABLET | Freq: Every day | ORAL | Status: DC
  Administered 2020-11-08 – 2020-11-09 (×2): 20 mg via ORAL
  Filled 2020-11-07 (×2): qty 1

## 2020-11-07 NOTE — Progress Notes (Signed)
Orthopedic Tech Progress Note Patient Details:  [name] 01-07-1946 [id number]  Patient ID: Ashley [name], female   DOB: 20-Feb-1946, 75 y.o.   MRN: [id number]   Ashley [name] 11/07/2020, 4:24 PM Ace applied to left foot to thigh per verbal order from Dr to provide compression

## 2020-11-07 NOTE — Plan of Care (Signed)
Patient admitted to 4E room 1428.When assessing pt upon arrival & changing linen, Charge RN Abby & myself noticed several flake like bug particles & blood specks on sheet under the pt back. Pt denied any itching. No bumps or abrasions on pt back. Alleged bed bugs/creatures are in specimen cup & service response was notified of the collection in specimen cup. Awaiting pickup. Linens discarded, double bagged. All Clean linens, gown & socks.  Pt VSS upon arrival to unit. Vit K. IV given fist-MD Neysa Bonito agreed with this, given that this is a time sensitive drug once delivered to floor.  Hgb was 5.3 10:28 (ED) Blood order not released/given in ED Hgb 4.9 ([facility] order released immediately upon arrival to floor)  As soon as I noticed 4.9 Hgb I called blood bank to verify if the unit of blood was ready.  MD Neysa Bonito & Charge RN were notified of critical lab. VSS, denies SOB & remains in bed at this time. Blood retrieved immediately from blood bank & promptly started. Pt still remains stable & consent in chart.  Family at bedside.  LLE ace-wrapped

## 2020-11-07 NOTE — ED Provider Notes (Signed)
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 and Morbidity. The differential includes fracture, contusion, hematoma, dislocation, renal failure, anemia  I ordered, reviewed and interpreted labs, which included CBC with elevated white count, critical low hemoglobin of 5.3, normal platelets, chemistries abnormal with worsening sodium and bicarb, worsening creatinine, INR supratherapeutic at 8.2 I ordered medication transfusion of red blood cells I ordered imaging studies which included duplex left lower extremity, x-rays of left hip and left knee, CT abdomen and pelvis and CT femur and I independently    visualized and interpreted imaging which showed emphysematous bladder wall and likely hematoma in patient's thigh  Previous records obtained and reviewed in epic including recent admissions I consulted [facility] hospitalist Dr. Neysa Bonito and discussed lab and imaging  findings  Critical Interventions: Work-up and management of patient's left thigh pain including transfusion of blood products for critically low hemoglobin  After the interventions stated above, I reevaluated the patient and found patient's symptoms to be improving.  Reviewed work-up with her and she is comfortable plan for admission.   Final Clinical Impression(s) / ED Diagnoses Final diagnoses:  Left leg pain  Anemia, unspecified type  Supratherapeutic INR  Chronic kidney disease, unspecified CKD stage    Rx / DC Orders ED Discharge Orders    None       Hayden Rasmussen, MD 11/07/20 1740

## 2020-11-07 NOTE — H&P (Addendum)
History and Physical        Hospital Admission Note Date: 11/07/2020  Patient name: Ashley [name] Medical record number: [id number] Date of birth: 08-06-1946 Age: 75 y.o. Gender: female  PCP: Unk Pinto, MD   Chief Complaint    Chief Complaint  Patient presents with  . Leg Pain      HPI:   This is a 75 year old female with past medical history of type 2 diabetes, hypertension, DVT on Coumadin, CKD 4, neuropathy, lung cancer in remission, vulvar cancer in remission, COPD, GERD, hiatal hernia, iron deficiency, anxiety/bipolar disorder with multiple recent hospitalizations most recently from 3/11-3/13 for AKI on CKD 4 and anemia and received 1 unit PRBCs who presented to the ED today for left leg pain x about 10 days.  Patient fell 10 days ago landing on her buttocks and has since tried Tylenol without any improvement.  Her pain has been worsening and she has difficulty bearing weight on her left leg which prompted her to come to the ED.  She denies loss of consciousness, chest pain, dysuria or hematuria or any other complaints.  Admits to sensation in her feet.   ED Course: Afebrile, hemodynamically stable, on room air. Notable Labs: Sodium 128, K4.2, CO2 17, BUN 62, creatinine 4.05, WBC 13.0, Hb 5.3, MCV 97, INR 8.2, UA concerning for UTI, COVID-19 pending. Notable Imaging: Left hip XR-no fracture or dislocation, large amount of soft tissue gas around the urinary bladder concerning for emphysematous cystitis.  Left knee XR chondrocalcinosis noted medially and laterally consistent with calcium pyrophosphate deposition or early arthritis.    Vitals:   11/07/20 1330 11/07/20 1552  BP: (!) 144/69 138/70  Pulse: 93 93  Resp: 18 17  Temp:  98.6 F (37 C)  SpO2: 98% 99%     Review of Systems:  Review of Systems  All other systems reviewed and are  negative.   Medical/Social/Family History   Past Medical History: Past Medical History:  Diagnosis Date  . Anxiety disorder   . Arthritis LOWER BACK  . Asthma   . Bipolar 1 disorder ([place])   . Carpal tunnel syndrome   . CKD (chronic kidney disease), stage IV (HCC)    stage 3  . Depression   . Diabetes mellitus    type 2  . DVT (deep venous thrombosis) ([place])   . Dyslipidemia   . Dyspnea    with much activity  . Gait disorder   . GERD (gastroesophageal reflux disease)   . Gout LEFT FOOT-  STABLE  . H/O hiatal hernia   . History of CVA (cerebrovascular accident) FOUND PER MRI 1994--  RESIDUAL MEMORY IMPAIRED  . Hypercholesteremia   . Memory difficulty 08/14/2016  . OCD (obsessive compulsive disorder)   . Stroke ([place])    2000 memory loss   . SUI (stress urinary incontinence, female)   . Vulva cancer ([place]) 07/07/2012    Past Surgical History:  Procedure Laterality Date  . BLADDER SUSPENSION  1996  . CATARACT EXTRACTION    . COLONOSCOPY WITH PROPOFOL N/A 10/01/2017   Procedure: COLONOSCOPY WITH PROPOFOL;  Surgeon: [name], MD;  Location: [facility];  Service: Endoscopy;  Laterality: N/A;  . FUDUCIAL  PLACEMENT N/A 06/24/2019   Procedure: PLACEMENT OF FUDUCIAL MARKERS;  Surgeon: Garner Nash, DO;  Location: [facility];  Service: Thoracic;  Laterality: N/A;  . [place]  . VAGINAL HYSTERECTOMY  1985  . VIDEO BRONCHOSCOPY WITH ENDOBRONCHIAL NAVIGATION N/A 06/24/2019   Procedure: VIDEO BRONCHOSCOPY WITH ENDOBRONCHIAL NAVIGATION;  Surgeon: Garner Nash, DO;  Location: [facility];  Service: Thoracic;  Laterality: N/A;  . VULVAR LESION REMOVAL  06/22/2012   Procedure: VULVAR LESION;  Surgeon: Selinda Orion, MD;  Location: [facility];  Service: Gynecology;  Laterality: N/A;  WIDE EXCISION VULVAR LESION   . VULVECTOMY PARTIAL  DEC 1999    Medications: Prior to Admission medications   Medication Sig Start Date End Date  Taking? Authorizing Provider  acetaminophen (TYLENOL) 500 MG tablet Take 1,000 mg by mouth every 6 (six) hours as needed for mild pain or headache.   Yes [provider]  amLODipine (NORVASC) 10 MG tablet Take 1 tablet (10 mg total) by mouth daily. 07/04/19  Yes Cristal Deer, MD  aspirin EC 81 MG tablet Take 81 mg by mouth daily.   Yes [provider]  atorvastatin (LIPITOR) 40 MG tablet TAKE 1 TABLET BY MOUTH EVERY DAY FOR CHOLESTEROL Patient taking differently: Take 40 mg by mouth daily. 09/25/20  Yes Liane Comber, NP  azelastine (ASTELIN) 0.1 % nasal spray PLACE 2 SPRAYS INTO BOTH NOSTRILS 2 (TWO) TIMES DAILY. USE IN EACH NOSTRIL AS DIRECTED Patient taking differently: Place 2 sprays into both nostrils 2 (two) times daily as needed for allergies. 05/29/20  Yes Unk Pinto, MD  Cholecalciferol (VITAMIN D) 125 MCG (5000 UT) CAPS Take 5,000 Units by mouth in the morning and at bedtime.   Yes [provider]  clorazepate (TRANXENE-T) 7.5 MG tablet Take    1/2 to 1 tablet    1 or 2 x /day    ONLY if needed for  Nerves Patient taking differently: Take 7.5 mg by mouth 2 (two) times daily as needed for anxiety or sleep. 05/22/20  Yes Unk Pinto, MD  famotidine (PEPCID) 20 MG tablet TAKE 1 TABLET TWICE A DAY WITH MEALS FOR INDIGESTION. Patient taking differently: Take 20 mg by mouth 2 (two) times daily. 11/30/19  Yes Unk Pinto, MD  fenofibrate (TRICOR) 145 MG tablet TAKE 1 TABLET DAILY FOR TRIGLYCERIDES (BLOOD FATS) Patient taking differently: Take 145 mg by mouth daily. 08/22/20  Yes Unk Pinto, MD  ferrous sulfate 325 (65 FE) MG tablet TAKE 1 TABLET 2 TIMES A DAY WITH A MEAL FOR IRON DEFICIENCY Patient taking differently: Take 325 mg by mouth 2 (two) times daily with a meal. 10/10/20  Yes McClanahan, Kyra, NP  FLUoxetine (PROZAC) 20 MG capsule Take 20 mg by mouth daily.   Yes [provider]  hydrALAZINE (APRESOLINE) 100 MG tablet Take 1  tablet (100 mg total) by mouth every 8 (eight) hours. 10/14/20 01/12/21 Yes Dahal, Marlowe Aschoff, MD  levothyroxine (SYNTHROID) 88 MCG tablet Take   1 tablet    Daily   on an empty stomach with only water for 30 minutes & no Antacid meds, Calcium or Magnesium for 4 hours & avoid Biotin Patient taking differently: Take 88 mcg by mouth daily before breakfast. 09/05/20  Yes Unk Pinto, MD  Multiple Vitamins-Minerals (MULTIVITAMIN WITH MINERALS) tablet Take 1 tablet by mouth daily.   Yes [provider]  NOVOLIN N 100 UNIT/ML injection INJECT 50 UNITS EVERY MORNING AND  [place] Patient taking differently: Inject 25-50 Units into the skin 3 (three) times daily as needed (high blood sugar). Injects 25-50 units into the skin after checking CBG daily (CBG 160-299 mg/dL: 25 units; CBG >300 mg/dl: 50 units); gives extra 25 units in the evening if taking a big meal 05/09/20  Yes Unk Pinto, MD  nystatin cream (MYCOSTATIN) Apply 1 application topically daily as needed for dry skin. 10/17/20  Yes [provider]  polyethylene glycol (MIRALAX / GLYCOLAX) 17 g packet Take 17 g by mouth daily. Patient taking differently: Take 17 g by mouth daily as needed for mild constipation. 03/06/20  Yes Vicie Mutters R, PA-C  potassium chloride (KLOR-CON M10) 10 MEQ tablet TAKE 1 TABLET 3 X /DAY WITH MEALS FOR POTASSIUM Patient taking differently: Take 10 mEq by mouth 3 (three) times daily. 01/08/20  Yes Unk Pinto, MD  vitamin C (ASCORBIC ACID) 500 MG tablet Take 500 mg by mouth daily. Take with iron/ferrous sulfate   Yes [provider]  warfarin (COUMADIN) 5 MG tablet TAKE 1 TABLET BY MOUTH EVERY DAY Patient taking differently: Take 2.5-5 mg by mouth See admin instructions. Takes 1 tablet (5 mg totally) by mouth every day; Except 0.5 tablet (2.5 mg totally) on Monday 07/06/20  Yes Hilty, Nadean Corwin, MD  bumetanide (BUMEX) 1 MG tablet Take 1 tablet (1 mg total) by mouth daily as  needed. 10/28/20 01/26/21  Terrilee Croak, MD  ONETOUCH ULTRA test strip TEST BLOOD SUGAR 3 TIMES A DAY 01/27/20   Vladimir Crofts, PA-C    Allergies:   Allergies  Allergen Reactions  . Sulfa Antibiotics Other (See Comments)    Pt states she had "extreme pain"  . Sulfacetamide Sodium Other (See Comments)    Pain    Social History:  reports that she has been smoking cigarettes. She has a 54.00 pack-year smoking history. She has never used smokeless tobacco. She reports that she does not drink alcohol and does not use drugs.  Family History: Family History  Problem Relation Age of Onset  . Stroke Father   . Heart failure Father   . Heart disease Father   . Hyperlipidemia Father   . Hypertension Father   . Hypertension Brother   . Hyperlipidemia Mother   . Stroke Mother   . Colon cancer Neg Hx      Objective   Physical Exam: Blood pressure 138/70, pulse 93, temperature 98.6 F (37 C), resp. rate 17, height 5\' 3"  (1.6 m), weight 70.3 kg, SpO2 99 %.  Physical Exam Vitals and nursing note reviewed.  Constitutional:      Comments: Chronically ill-appearing  HENT:     Head: Normocephalic.     Mouth/Throat:     Mouth: Mucous membranes are moist.  Eyes:     Conjunctiva/sclera: Conjunctivae normal.  Cardiovascular:     Rate and Rhythm: Normal rate and regular rhythm.  Pulmonary:     Effort: Pulmonary effort is normal. No respiratory distress.  Abdominal:     General: Abdomen is flat. There is no distension.     Tenderness: There is no abdominal tenderness.  Musculoskeletal:     Comments: Left lower extremity with large ecchymoses and tenderness to palpation along the leg Sensation intact of left foot Left lower extremity DP pulses noted on Doppler  Skin:    Coloration: Skin is pale.     Findings: Bruising present.  Neurological:     Mental Status: She is alert. Mental status  is at baseline.     Sensory: No sensory deficit.  Psychiatric:        Mood and Affect:  Mood is anxious.        LABS on Admission: I have personally reviewed all the labs and imaging below    Basic Metabolic Panel: Recent Labs  Lab 11/07/20 1028  NA 128*  K 4.2  CL 98  CO2 17*  GLUCOSE 101*  BUN 62*  CREATININE 4.05*  CALCIUM 8.6*   Liver Function Tests: Recent Labs  Lab 11/07/20 1028  AST 34  ALT 18  ALKPHOS 44  BILITOT 1.0  PROT 5.7*  ALBUMIN 2.9*   No results for input(s): LIPASE, AMYLASE in the last 168 hours. No results for input(s): AMMONIA in the last 168 hours. CBC: Recent Labs  Lab 11/07/20 1028  WBC 13.0*  NEUTROABS 10.7*  HGB 5.3*  HCT 17.0*  MCV 97.7  PLT 400   Cardiac Enzymes: No results for input(s): CKTOTAL, CKMB, CKMBINDEX, TROPONINI in the last 168 hours. BNP: Invalid input(s): POCBNP CBG: No results for input(s): GLUCAP in the last 168 hours.  Radiological Exams on Admission:  CT Abdomen Pelvis Wo Contrast  Result Date: 11/07/2020 CLINICAL DATA:  Left leg and foot pain for 1 week. EXAM: CT ABDOMEN AND PELVIS WITHOUT CONTRAST TECHNIQUE: Multidetector CT imaging of the abdomen and pelvis was performed following the standard protocol without IV contrast. COMPARISON:  CT abdomen and pelvis 09/19/2020. FINDINGS: Lower chest: Right greater than left small pleural effusions are new since the prior exam. There is associated compressive atelectasis. Opacity in the right lung base with adjacent fiducial markers is unchanged. Hepatobiliary: No focal liver abnormality is seen. No gallstones, gallbladder wall thickening, or biliary dilatation. Pancreas: Unremarkable. No pancreatic ductal dilatation or surrounding inflammatory changes. Spleen: Normal in size without focal abnormality. Adrenals/Urinary Tract: There is a massive volume of air in the walls of the urinary bladder consistent with emphysematous cystitis. The ureters and renal collecting systems are unremarkable. Vascular calcifications versus punctate nonobstructing stones in both  kidneys are unchanged. Stomach/Bowel: Stomach is within normal limits. Appendix appears normal. No evidence of bowel wall thickening, distention, or inflammatory changes. Vascular/Lymphatic: Extensive atherosclerosis.  No lymphadenopathy. Reproductive: Status post hysterectomy. No adnexal masses. Other: There is some presacral edema which is new since the prior exam. Musculoskeletal: A fluid collection with a hematocrit level in the left gluteus maximus measures 2.2 x 4.8 cm in the axial plane. A second fluid collection along the periphery of the left gluteus medius measures 2.7 x 2.1 cm. There is also a heterogeneous fluid collection with hematocrit levels in the left vastus lateralis measuring 6.4 X 2.7 cm. This collection is incompletely visualized. Fluid collection in the subcutaneous fat adjacent to the right greater trochanter measures 5.0 x 3.6 cm. All of these collections are new since the prior exam. No acute bony abnormality is identified. IMPRESSION: Massive volume of air in the walls of the urinary bladder consistent with emphysematous cystitis. New small bilateral pleural effusions, greater on the right, with associated basilar atelectasis. New fluid collections in the left gluteal and left vastus lateralis muscles have an appearance consistent with hematomas. Fluid collection subcutaneous fat adjacent to the right greater trochanter is also likely hematoma. No gas is seen within any of the collections but super infection is possible. Aortic Atherosclerosis (ICD10-I70.0). Electronically Signed   By: Inge Rise M.D.   On: 11/07/2020 14:03   CT FEMUR LEFT WO CONTRAST  Result Date: 11/07/2020  CLINICAL DATA:  Left leg pain and swelling for the past week and a half. EXAM: CT OF THE LOWER LEFT EXTREMITY WITHOUT CONTRAST TECHNIQUE: Multidetector CT imaging of the lower left extremity was performed according to the standard protocol. COMPARISON:  Left hip and knee x-rays from same day. FINDINGS:  Bones/Joint/Cartilage No fracture or dislocation. Mild left hip and knee osteoarthritis with chondrocalcinosis. No joint effusion. Ligaments Ligaments are suboptimally evaluated by CT. Muscles and Tendons Large multiloculated mixed density fluid collection involving the left vastus lateralis muscle measuring 6.7 x 3.4 x 21.6 cm (AP by transverse by CC) with fluid-fluid levels. Smaller mixed density collections involving the left gluteus medius and maximus muscles. From the gluteus maximus collection also demonstrates a fluid fluid level (series 3, image 47). Soft tissue Moderate soft tissue swelling of the left thigh. No soft tissue mass. Extensive air within the bladder wall. IMPRESSION: 1. Large multiloculated mixed density fluid collection involving the left vastus lateralis muscle measuring 6.7 x 3.4 x 21.6 cm with smaller mixed density collections involving the left gluteus medius and maximus muscles, consistent with intramuscular hematomas. 2. Emphysematous cystitis. 3. No acute osseous abnormality. Electronically Signed   By: Titus Dubin M.D.   On: 11/07/2020 13:55   DG Knee Complete 4 Views Left  Result Date: 11/07/2020 CLINICAL DATA:  Left knee pain after fall. EXAM: LEFT KNEE - COMPLETE 4+ VIEW COMPARISON:  None. FINDINGS: No evidence of fracture, dislocation, or joint effusion. No significant joint space narrowing is noted. Chondrocalcinosis is noted medially and laterally consistent with calcium pyrophosphate deposition disease or early degenerative joint disease. Soft tissues are unremarkable. IMPRESSION: Chondrocalcinosis is noted medially and laterally consistent with calcium pyrophosphate deposition disease or early degenerative joint disease. No acute abnormality seen in the left knee. Electronically Signed   By: Marijo Conception M.D.   On: 11/07/2020 12:30   DG Hip Unilat With Pelvis 2-3 Views Left  Result Date: 11/07/2020 CLINICAL DATA:  Acute left leg pain. EXAM: DG HIP (WITH OR  WITHOUT PELVIS) 2-3V LEFT COMPARISON:  None. FINDINGS: There is no evidence of hip fracture or dislocation. There is no evidence of arthropathy or other focal bone abnormality. However, large amount of soft tissue gas is seen around the expected position of the urinary bladder, concerning for emphysematous cystitis. IMPRESSION: No fracture or dislocation is noted. Large amount of soft tissue gas is seen around the expected position of the urinary bladder, concerning for emphysematous cystitis. CT scan is recommended for further evaluation. Electronically Signed   By: Marijo Conception M.D.   On: 11/07/2020 12:27   VAS [place] LOWER EXTREMITY VENOUS (DVT) ([facility] and [facility] 7a-7p)  Result Date: 11/07/2020  Lower Venous DVT Study Indications: Swelling.  Comparison Study: 10/27/20 previous Performing Technologist: Abram Sander RVS  Examination Guidelines: A complete evaluation includes B-mode imaging, spectral Doppler, color Doppler, and power Doppler as needed of all accessible portions of each vessel. Bilateral testing is considered an integral part of a complete examination. Limited examinations for reoccurring indications may be performed as noted. The reflux portion of the exam is performed with the patient in reverse Trendelenburg.  +-----+---------------+---------+-----------+----------+--------------+ RIGHTCompressibilityPhasicitySpontaneityPropertiesThrombus Aging +-----+---------------+---------+-----------+----------+--------------+ CFV  Full           Yes      Yes                                 +-----+---------------+---------+-----------+----------+--------------+   +---------+---------------+---------+-----------+----------+--------------+ LEFT  CompressibilityPhasicitySpontaneityPropertiesThrombus Aging +---------+---------------+---------+-----------+----------+--------------+ CFV      Full           Yes      Yes                                  +---------+---------------+---------+-----------+----------+--------------+ SFJ      Full                                                        +---------+---------------+---------+-----------+----------+--------------+ FV Prox  Full                                                        +---------+---------------+---------+-----------+----------+--------------+ FV Mid   Full                                                        +---------+---------------+---------+-----------+----------+--------------+ FV DistalFull                                                        +---------+---------------+---------+-----------+----------+--------------+ PFV      Full                                                        +---------+---------------+---------+-----------+----------+--------------+ POP      Full           Yes      Yes                                 +---------+---------------+---------+-----------+----------+--------------+ PTV      Full                                                        +---------+---------------+---------+-----------+----------+--------------+ PERO     Full                                                        +---------+---------------+---------+-----------+----------+--------------+     Summary: RIGHT: - No evidence of common femoral vein obstruction.  LEFT: - There is no evidence of deep vein thrombosis in the lower extremity.  - No cystic structure found in the popliteal fossa.  *See table(s) above for measurements and observations. Electronically signed by Curt Jews  MD on 11/07/2020 at 2:09:42 PM.    Final       EKG: unchanged from previous tracings, normal sinus rhythm, nonspecific ST and T waves changes   A & P   Principal Problem:   Symptomatic anemia Active Problems:   Essential hypertension   CKD stage 4 due to type 2 diabetes mellitus (HCC)   Supratherapeutic INR   Hematoma of left thigh   1. Left  lower extremity pain, likely secondary to hematoma s/p fall while on Coumadin a. CT left femur without contrast-large multiloculated collection involving the left vastus lateralis muscle measuring 6.7 x 3.4 x 21.6 cm with smaller mixed density collections involving the left gluteus medius and maximus muscles consistent with intramuscular hematomas b. Not concerning for compartment syndrome at this time c. Holding Coumadin and giving vitamin K for INR reversal d. Discussed with Dr. Lynann Bologna who recommended a compression wrap from groin to ankle and conservative management for now e. PT eval when able f. Pain management  2. Acute normocytic anemia suspect secondary to bleeding into left leg/hematoma as above with history of iron deficiency a. Hb 5.3, baseline Hb 11-12.  Hemodynamically stable on room air b. Hold antihypertensives for now c. Continue with blood transfusions and trend H&H posttransfusion d. Continue home iron supplement  3. Massive emphysematous cystitis and UTI a. Noted on CT scan and positive UA b. Discussed with Dr. Diona Fanti, urology, recommended starting Augmentin and repeat CT in 1 week  4. Supratherapeutic INR a. INR 8.2 and on Coumadin with bleeding as above b. Holding coumadin c. Eppie Gibson - discussed risks with patient who agreed with treatment. Discussed with pharmacy who agreed patient is a candidate.  5. Hypertension a. Holding antihypertensives for now  6. Hyperlipidemia a. Continue fenofibrate and Lipitor  7. Hypothyroidism a. Continue Synthroid  8. CKD 4 a. Creatinine slightly above baseline, currently 4.05 and baseline 3.5-3.8 b. Likely from decreased perfusion from anemia c. Follow-up after blood transfusions  9. Hyponatremia a. IV fluids  10. COPD, not in exacerbation  11. Type 2 diabetes a. Heart healthy carb modified diet for now and hold off insulin  12. Fall a. PT eval when able    DVT prophylaxis: SCD   Code Status: Full Code   Diet: Heart healthy carb modified diet Family Communication: Admission, patients condition and plan of care including tests being ordered have been discussed with the patient who indicates understanding and agrees with the plan and Code Status. Patient's brother was updated  Disposition Plan: The appropriate patient status for this patient is INPATIENT. Inpatient status is judged to be reasonable and necessary in order to provide the required intensity of service to ensure the patient's safety. The patient's presenting symptoms, physical exam findings, and initial radiographic and laboratory data in the context of their chronic comorbidities is felt to place them at high risk for further clinical deterioration. Furthermore, it is not anticipated that the patient will be medically stable for discharge from the hospital within 2 midnights of admission. The following factors support the patient status of inpatient.   " The patient's presenting symptoms include left leg pain. " The worrisome physical exam findings include left leg ecchymoses and tenderness. " The initial radiographic and laboratory data are worrisome because of anemia, left leg hematoma. " The chronic co-morbidities include hypertension, hyperlipidemia, diabetes.   * I certify that at the point of admission it is my clinical judgment that the patient will require inpatient hospital care spanning beyond 2 midnights  from the point of admission due to high intensity of service, high risk for further deterioration and high frequency of surveillance required.*    The medical decision making on this patient was of high complexity and the patient is at high risk for clinical deterioration, therefore this is a level 3  admission.  Consultants  . Discussed with Ortho . Discussed with pharmacy . Discussed with urology  Procedures  . [facility]  Time Spent on Admission: 80 minutes    Harold Hedge, DO [facility]  11/07/2020, 4:11  PM

## 2020-11-07 NOTE — Progress Notes (Signed)
Lower extremity venous has been completed.   Preliminary results in CV Proc.   Abram Sander 11/07/2020 11:03 AM

## 2020-11-07 NOTE — ED Triage Notes (Signed)
[facility] brings pt in from home for left leg pain for about a week and half. Pt reports bilateral lower leg swelling and left knee swelling.

## 2020-11-08 ENCOUNTER — Inpatient Hospital Stay: Payer: HMO | Admitting: Adult Health Nurse Practitioner

## 2020-11-08 LAB — CBC
HCT: 25.1 % — ABNORMAL LOW (ref 36.0–46.0)
Hemoglobin: 8.3 g/dL — ABNORMAL LOW (ref 12.0–15.0)
MCH: 30.6 pg (ref 26.0–34.0)
MCHC: 33.1 g/dL (ref 30.0–36.0)
MCV: 92.6 fL (ref 80.0–100.0)
Platelets: 334 10*3/uL (ref 150–400)
RBC: 2.71 MIL/uL — ABNORMAL LOW (ref 3.87–5.11)
RDW: 15.7 % — ABNORMAL HIGH (ref 11.5–15.5)
WBC: 9.8 10*3/uL (ref 4.0–10.5)
nRBC: 0.9 % — ABNORMAL HIGH (ref 0.0–0.2)

## 2020-11-08 LAB — BASIC METABOLIC PANEL
Anion gap: 12 (ref 5–15)
BUN: 63 mg/dL — ABNORMAL HIGH (ref 8–23)
CO2: 19 mmol/L — ABNORMAL LOW (ref 22–32)
Calcium: 8.6 mg/dL — ABNORMAL LOW (ref 8.9–10.3)
Chloride: 99 mmol/L (ref 98–111)
Creatinine, Ser: 3.63 mg/dL — ABNORMAL HIGH (ref 0.44–1.00)
GFR, Estimated: 13 mL/min — ABNORMAL LOW (ref >60)
Glucose, Bld: 245 mg/dL — ABNORMAL HIGH (ref 70–99)
Potassium: 3.7 mmol/L (ref 3.5–5.1)
Sodium: 130 mmol/L — ABNORMAL LOW (ref 135–145)

## 2020-11-08 LAB — BPAM RBC
Blood Product Expiration Date: 202204272359
Blood Product Expiration Date: 202204282359
ISSUE DATE / TIME: 202203231842
ISSUE DATE / TIME: 202203232344
Unit Type and Rh: 9500
Unit Type and Rh: 9500

## 2020-11-08 LAB — GLUCOSE, CAPILLARY
Glucose-Capillary: 343 mg/dL — ABNORMAL HIGH (ref 70–99)
Glucose-Capillary: 264 mg/dL — ABNORMAL HIGH (ref 70–99)
Glucose-Capillary: 188 mg/dL — ABNORMAL HIGH (ref 70–99)

## 2020-11-08 LAB — TYPE AND SCREEN
ABO/RH(D): O NEG
Antibody Screen: NEGATIVE
Unit division: 0
Unit division: 0

## 2020-11-08 LAB — PROTIME-INR
INR: 1.1 (ref 0.8–1.2)
Prothrombin Time: 14 seconds (ref 11.4–15.2)

## 2020-11-08 MED ORDER — INSULIN ASPART 100 UNIT/ML ~~LOC~~ SOLN: 0.0000 [IU] | Freq: Every day | SUBCUTANEOUS | Status: DC

## 2020-11-08 MED ORDER — AMLODIPINE BESYLATE 10 MG PO TABS
10.0000 mg | ORAL_TABLET | Freq: Every day | ORAL | Status: DC
  Administered 2020-11-08 – 2020-11-09 (×2): 10 mg via ORAL
  Filled 2020-11-08 (×2): qty 1

## 2020-11-08 MED ORDER — INSULIN GLARGINE 100 UNIT/ML ~~LOC~~ SOLN
10.0000 [IU] | Freq: Every day | SUBCUTANEOUS | Status: DC
  Administered 2020-11-08 – 2020-11-09 (×2): 10 [IU] via SUBCUTANEOUS
  Filled 2020-11-08 (×2): qty 0.1

## 2020-11-08 MED ORDER — HYDRALAZINE HCL 50 MG PO TABS
100.0000 mg | ORAL_TABLET | Freq: Three times a day (TID) | ORAL | Status: DC
  Administered 2020-11-08 – 2020-11-09 (×3): 100 mg via ORAL
  Filled 2020-11-08 (×4): qty 2

## 2020-11-08 MED ORDER — POTASSIUM CHLORIDE CRYS ER 10 MEQ PO TBCR
10.0000 meq | EXTENDED_RELEASE_TABLET | Freq: Three times a day (TID) | ORAL | Status: DC
  Administered 2020-11-08 – 2020-11-09 (×4): 10 meq via ORAL
  Filled 2020-11-08 (×5): qty 1

## 2020-11-08 MED ORDER — INSULIN ASPART 100 UNIT/ML ~~LOC~~ SOLN
0.0000 [IU] | Freq: Three times a day (TID) | SUBCUTANEOUS | Status: DC
  Administered 2020-11-08: 5 [IU] via SUBCUTANEOUS
  Administered 2020-11-08: 7 [IU] via SUBCUTANEOUS

## 2020-11-08 NOTE — Progress Notes (Signed)
Patient has had 2 units of PRBCs tonight.  No complaints from the patient.  RN will order the H&H after the 2nd unit PRBC has infused.

## 2020-11-08 NOTE — Plan of Care (Signed)
°  Problem: Coping: °Goal: Level of anxiety will decrease °Outcome: Progressing °  °

## 2020-11-08 NOTE — Progress Notes (Signed)
PROGRESS NOTE  Ashley [name]  DOB: 1946/06/21  PCP: Unk Pinto, MD YCX:[id number]  DOA: 11/07/2020  LOS: 1 day   Chief Complaint  Patient presents with  . Leg Pain   Brief narrative: Ashley [name] is a 75 y.o. female with PMH significant for T2DM, HTN, DVT on Coumadin, CKD stage IV, lung cancer in remission, vulvar cancer in remission, COPD, GERD, hiatal hernia, chronic iron deficiency, anxiety/bipolar disorderwho was recently hospitalized 3/11-3/13 for AKI on CKD symptomatic anemia, given 1 unit PRBC and discharged to home.   Patient presented to the ED on 3/23 with complaint of left lower extremity pain after a fall. On Sunday night 3/20, patient fell landing on her buttocks after which she has an area of ecchymosis. Since then she has significant pain and difficulty bearing weight on her left leg because of which she came to the ED.  In the ED, sodium was low at 128, serum bicarb low at 17, BUN/creatinine 62/4.05, baseline between 3 and 4, hemoglobin low at 5.3, INR elevated to 8.2. Urinalysis showed yellow turbid urine with moderate amount of leukocytes and many bacteria. Left hip x-ray did not show any fracture dislocation but showed large amount of soft tissue gas from the urinary bladder concerning for emphysematous cystitis. Left knee x-ray showed chondrocalcinosis medially and laterally consistent with calcium pyrophosphate deposition or early arthritis. CT left femur without contrast showed large multiloculated collection involving the left vastus lateralis muscle measuring 6.7 x 3.4 x 21.6 cm with smaller mixed density collections involving the left gluteus medius and maximus muscles consistent with intramuscular hematomas  Patient was admitted to hospitalist service. See below for details.  Subjective: Patient was seen and examined this morning.  Pleasant elderly Caucasian female.  Sitting up in bed.  Has compression bandage throughout her left lower extremity.  Pain  controlled better this morning. Chart reviewed No fever, blood pressure in 150s Labs this morning with creatinine improving to 3.63, hemoglobin up to 8.3.  Assessment/Plan: Left lower extremity pain Large multiloculated hematoma -Secondary to a fall while on Coumadin -Not concerning for compartment syndrome at this time. -Coumadin on hold. -Admitting MD discussed with Ortho on-call Dr. Lynann Bologna recommend compression wrap from groin to ankle and conservative management for now -Coumadin on hold. -PT eval -Pain management  Acute normocytic anemia -Probably secondary to bleeding into large hematoma. -On her recent hospitalization, FOBT was negative. -Her hemoglobin prior to the admission in February was consistently over 10. -Presented with low hemoglobin of 4.9 this admission. -2 units PRBCs transfused.  Improvement in hemoglobin to 8.3 this morning. Recent Labs    10/10/20 0438 10/10/20 0447 10/11/20 0317 10/26/20 1435 10/27/20 0507 10/27/20 2021 10/28/20 0540 11/07/20 1028 11/07/20 1605 11/08/20 0346  HGB 11.7*  --    < > 7.6*   < > 8.5* 9.5* 5.3* 4.9* 8.3*  MCV 85.6  --    < > 94.8   < >  --  92.0 97.7  --  92.6  VITAMINB12 1,461*  --   --  470  --   --   --   --   --   --   FOLATE  --  11.1  --  9.5  --   --   --   --   --   --   FERRITIN 180  --   --  152  --   --   --   --   --   --   TIBC 241*  --   --  295  --   --   --   --   --   --   IRON 138  --   --  76  --   --   --   --   --   --   RETICCTPCT  --  1.6  --  3.8*  --   --   --   --   --   --    < > = values in this interval not displayed.   Emphysematous cystitis and UTI -Urinalysis suggestive of UTI -CT abdomen on admission showed massive volume of air in the walls of the urinary bladder consistent with emphysematous cystitis. -Admitting MD discussed the case with urologist Dr. Diona Fanti who recommended starting on Augmentin and repeat CT in 1 week.  History of DVT on Coumadin Supratherapeutic INR -INR  was elevated to 8.2 on admission.  Reversed with vitamin K.   -Chart reviewed.  Follows up with Dr. Debara Pickett had an outpatient.  From the office note from September 2021, patient had nonresolving right femoral DVT with associated edema and failed half dose of Eliquis in the past.  Because of this, she was placed on Coumadin.   -Repeat DVT scan on her last admission noted resolution of the previously noted thrombus.  After discussion with patient and family, I resumed Coumadin at last discharge.  Obviously with new fall and intramuscular bleeding, Coumadin is on hold.  I discussed this case with Dr. Debara Pickett today 3/24.  Recommended to hold Coumadin at discharge and follow-up with him in the clinic next few weeks.  Since patient does not have any active thrombus, there would be no indication of IVC filter at this time.   Recent Labs  Lab 11/07/20 1028 11/08/20 0346  INR 8.2* 1.1   Uncontrolled type 2 diabetes mellitus  -A1c 8.6 on 10/10/2020 -At home, patient uses Novolin 15 units in the morning and 25 units in the night based on blood sugar.  -We will start on Lantus 10 units this morning and sliding scale insulin. Recent Labs  Lab 11/08/20 1128  GLUCAP 343*    CKD 4 -Baseline creatinine between 3 and 4.  Follows up with Dr. [name] as an outpatient. -On last discharge, patient was started on Bumex 1 mg daily as needed. -Creatinine this admission was slightly elevated above baseline but back to normal this morning. -Renal ultrasound from 2/24 was unremarkable. Recent Labs    10/11/20 0317 10/12/20 0301 10/13/20 0311 10/14/20 0431 10/25/20 1556 10/26/20 1435 10/27/20 0507 10/28/20 0540 11/07/20 1028 11/08/20 0346  BUN 67* 63* 58* 49* 41* 35* 46* 51* 62* 63*  CREATININE 4.12* 3.67* 3.48* 3.01* 3.51* 3.65* 3.47* 3.88* 4.05* 3.63*   Essential hypertension -Home meds include amlodipine, hydralazine and PRN Bumex. -Resume amlodipine and hydralazine.  Hyponatremia -Low but remains  stable. Recent Labs  Lab 11/07/20 1028 11/08/20 0346  NA 128* 130*   Chronic hypokalemia -Continue supplement. Recent Labs  Lab 11/07/20 1028 11/08/20 0346  K 4.2 3.7   Chronic constipation -Continue bowel regimen with Senokot, MiraLAX as needed.  COPD -Currently no wheezing,patient has stopped smoking last week. Declined nicotine patch.  Hypothyroidism -Continue Synthroid  GERD -Continue PPI  Peripheral neuropathy Depression -Continue antidepressants. Patient states her peripheral neuropathy medsareadjustedas an outpatientby psychiatrist. Continue to follow.  Mobility: PT eval Code Status:   Code Status: Full Code  Nutritional status: Body mass index is 27.46 kg/m.     Diet Order  Diet heart healthy/carb modified Room service appropriate? Yes; Fluid consistency: Thin  Diet effective now                 DVT prophylaxis: SCDs Antimicrobials:  Augmentin Fluid: None Consultants: On the phone urology and orthopedics Family Communication:  None at bedside  Status is: Inpatient  Remains inpatient appropriate because: Blood transfusion, hemoglobin monitoring, PT eval, INR monitoring  Dispo: The patient is from: Home              Anticipated d/c is to: Home likely in 2 to 3 days              Patient currently is not medically stable to d/c.   Difficult to place patient No       Infusions:  . sodium chloride      Scheduled Meds: . amLODipine  10 mg Oral Daily  . amoxicillin-clavulanate  1 tablet Oral BID  . atorvastatin  40 mg Oral Daily  . famotidine  20 mg Oral Daily  . fenofibrate  160 mg Oral Daily  . ferrous sulfate  325 mg Oral BID WC  . FLUoxetine  20 mg Oral Daily  . hydrALAZINE  100 mg Oral Q8H  . insulin aspart  0-5 Units Subcutaneous QHS  . insulin aspart  0-9 Units Subcutaneous TID WC  . insulin glargine  10 Units Subcutaneous Daily  . levothyroxine  88 mcg Oral Q0600  . potassium chloride  10 mEq Oral TID  .  sodium chloride flush  3 mL Intravenous Q12H    Antimicrobials: Anti-infectives (From admission, onward)   Start     Dose/Rate Route Frequency Ordered Stop   11/07/20 1500  amoxicillin-clavulanate (AUGMENTIN) 500-125 MG per tablet 500 mg        1 tablet Oral 2 times daily 11/07/20 1449     11/07/20 1445  amoxicillin-clavulanate (AUGMENTIN) 875-125 MG per tablet 1 tablet  Status:  Discontinued        1 tablet Oral Every 12 hours 11/07/20 1442 11/07/20 1448      PRN meds: acetaminophen **OR** acetaminophen, hydrALAZINE, HYDROcodone-acetaminophen, HYDROmorphone (DILAUDID) injection, polyethylene glycol   Objective: Vitals:   11/08/20 0500 11/08/20 0828  BP: (!) 154/84 (!) 146/78  Pulse: 90 92  Resp: 13   Temp: 98.4 F (36.9 C)   SpO2: 95% 96%    Intake/Output Summary (Last 24 hours) at 11/08/2020 1153 Last data filed at 11/08/2020 1049 Gross per 24 hour  Intake 1126.42 ml  Output 402 ml  Net 724.42 ml   Filed Weights   11/07/20 0956  Weight: 70.3 kg   Weight change:  Body mass index is 27.46 kg/m.   Physical Exam: General exam: Pleasant elderly Caucasian female.  Sitting up in bed.  Pain partially controlled Skin: No rashes, lesions or ulcers. HEENT: Atraumatic, normocephalic, no obvious bleeding Lungs: Clear to auscultation bilaterally CVS: Regular rate and rhythm, no murmur GI/Abd soft, nontender, nondistended, bowel sound present CNS: Alert, awake, oriented x3 Psychiatry: Mood appropriate Extremities: No pedal edema, no calf tenderness.  Left lower extremity has compression bandage from groin to ankle  Data Review: I have personally reviewed the laboratory data and studies available.  Recent Labs  Lab 11/07/20 1028 11/07/20 1605 11/08/20 0346  WBC 13.0*  --  9.8  NEUTROABS 10.7*  --   --   HGB 5.3* 4.9* 8.3*  HCT 17.0* 15.5* 25.1*  MCV 97.7  --  92.6  PLT 400  --  334  Recent Labs  Lab 11/07/20 1028 11/08/20 0346  NA 128* 130*  K 4.2 3.7  CL 98  99  CO2 17* 19*  GLUCOSE 101* 245*  BUN 62* 63*  CREATININE 4.05* 3.63*  CALCIUM 8.6* 8.6*    F/u labs ordered Unresulted Labs (From admission, onward)          Start     Ordered   11/08/20 8588  Basic metabolic panel  Daily,   R     Question:  Specimen collection method  Answer:  Lab=Lab collect   11/07/20 1539   11/08/20 0500  CBC  Daily,   R     Question:  Specimen collection method  Answer:  Lab=Lab collect   11/07/20 1539   11/08/20 0500  Protime-INR  Daily,   R      11/07/20 1322   11/07/20 1232  Urine culture  ONCE - STAT,   STAT        11/07/20 1232          Signed, Terrilee Croak, MD [facility] 11/08/2020

## 2020-11-08 NOTE — Evaluation (Signed)
Physical Therapy Evaluation Patient Details Name: Ashley [name] MRN: [id number] DOB: 04-05-1946 Today's Date: 11/08/2020   History of Present Illness  75 y.o. female with PMH significant for T2DM, HTN, DVT on Coumadin, CKD stage IV, lung cancer in remission, vulvar cancer in remission, COPD, GERD, hiatal hernia, chronic iron deficiency, anxiety/bipolar disorder who was recently hospitalized 3/11-3/13 for AKI on CKD symptomatic anemia, given 1 unit PRBC and discharged to home.    Patient presented to the ED on 3/23 with complaint of left lower extremity pain after a fall. On Sunday night 3/20, patient fell landing on her buttocks after which she has an area of ecchymosis.  Pt admitted for left LE pain and found to have Large multiloculated hematoma  Clinical Impression  Pt admitted with above diagnosis.  Pt currently with functional limitations due to the deficits listed below (see PT Problem List). Pt will benefit from skilled PT to increase their independence and safety with mobility to allow discharge to the venue listed below.  Pt reports multiple falls lately however numerous falls in the past 4 years.  Pt states she recently quit smoking and feeling "worked up."  Pt encouraged to use RW at home for pain control and safety due to falls.  Pt declines HHPT at this time.     Follow Up Recommendations Home health PT (however pt declines)    Equipment Recommendations  None recommended by PT    Recommendations for Other Services       Precautions / Restrictions Precautions Precautions: Fall Restrictions Weight Bearing Restrictions: No LLE Weight Bearing: Weight bearing as tolerated      Mobility  Bed Mobility Overal bed mobility: Needs Assistance Bed Mobility: Sit to Supine       Sit to supine: Supervision        Transfers Overall transfer level: Needs assistance Equipment used: Rolling walker (2 wheeled) Transfers: Sit to/from Stand Sit to Stand: Min guard          General transfer comment: min/guard for safety, cues for hand placement  Ambulation/Gait Ambulation/Gait assistance: Min guard Gait Distance (Feet): 30 Feet Assistive device: Rolling walker (2 wheeled) Gait Pattern/deviations: Step-to pattern;Antalgic     General Gait Details: slow gait, encouraged use of RW as pt hold walls/furniture and uses cane at home and reports numerous falls lately, cues for safe use of RW, remained in room per pt preference  Stairs            Wheelchair Mobility    Modified Rankin (Stroke Patients Only)       Balance Overall balance assessment: Needs assistance;History of Falls         Standing balance support: No upper extremity supported Standing balance-Leahy Scale: Fair                               Pertinent Vitals/Pain Pain Assessment: No/denies pain Pain Location: reports pain managed with pain meds    Home Living Family/patient expects to be discharged to:: Private residence Living Arrangements: Spouse/significant other Available Help at Discharge: Available PRN/intermittently;Family Type of Home: House Home Access: Stairs to enter Entrance Stairs-Rails: None Entrance Stairs-Number of Steps: 2 Home Layout: One level Home Equipment: Cane - single point;Walker - standard;Bedside commode      Prior Function Level of Independence: Independent with assistive device(s)         Comments: Amb with cane and cruises wall.     Hand Dominance  Dominant Hand: Right    Extremity/Trunk Assessment        Lower Extremity Assessment Lower Extremity Assessment: Generalized weakness    Cervical / Trunk Assessment Cervical / Trunk Assessment: Kyphotic  Communication   Communication: No difficulties  Cognition Arousal/Alertness: Awake/alert Behavior During Therapy: WFL for tasks assessed/performed   Area of Impairment: Safety/judgement;Problem solving                         Safety/Judgement:  Decreased awareness of safety;Decreased awareness of deficits   Problem Solving: Slow processing General Comments: pt believes she has mobility "restrictions" despite education for no precautions, just mobilizing as tolerated      General Comments General comments (skin integrity, edema, etc.): Pt reports numerous falls in the past 4 years, typically falls posteriorly.  Pt reports hx of smoking and recently quit.  Pt also reports being diabetic and lately eating whatever she wants, hx of neuropathy as well.    Exercises     Assessment/Plan    PT Assessment Patient needs continued PT services  PT Problem List Decreased strength;Decreased activity tolerance;Decreased balance;Decreased mobility;Impaired sensation;Decreased knowledge of use of DME       PT Treatment Interventions DME instruction;Gait training;Stair training;Functional mobility training;Therapeutic activities;Therapeutic exercise;Balance training;Patient/family education    PT Goals (Current goals can be found in the Care Plan section)  Acute Rehab PT Goals PT Goal Formulation: With patient Time For Goal Achievement: 11/15/20 Potential to Achieve Goals: Good    Frequency Min 3X/week   Barriers to discharge        Co-evaluation               AM-PAC PT "6 Clicks" Mobility  Outcome Measure Help needed turning from your back to your side while in a flat bed without using bedrails?: None Help needed moving from lying on your back to sitting on the side of a flat bed without using bedrails?: None Help needed moving to and from a bed to a chair (including a wheelchair)?: A Little Help needed standing up from a chair using your arms (e.g., wheelchair or bedside chair)?: A Little Help needed to walk in hospital room?: A Little Help needed climbing 3-5 steps with a railing? : A Little 6 Click Score: 20    End of Session Equipment Utilized During Treatment: Gait belt Activity Tolerance: Patient tolerated  treatment well Patient left: in bed;with call bell/phone within reach;with bed alarm set;with family/visitor present Nurse Communication: Mobility status PT Visit Diagnosis: Unsteadiness on feet (R26.81);Muscle weakness (generalized) (M62.81)    Time: 3491-7915 PT Time Calculation (min) (ACUTE ONLY): 26 min   Charges:   PT Evaluation $PT Eval Low Complexity: 1 Low        Kati PT, DPT Acute Rehabilitation Services Pager: [phone] Office: [phone]  York Ram E 11/08/2020, 3:30 PM

## 2020-11-09 LAB — BASIC METABOLIC PANEL
Anion gap: 12 (ref 5–15)
BUN: 61 mg/dL — ABNORMAL HIGH (ref 8–23)
CO2: 18 mmol/L — ABNORMAL LOW (ref 22–32)
Calcium: 8.6 mg/dL — ABNORMAL LOW (ref 8.9–10.3)
Chloride: 99 mmol/L (ref 98–111)
Creatinine, Ser: 3.56 mg/dL — ABNORMAL HIGH (ref 0.44–1.00)
GFR, Estimated: 13 mL/min — ABNORMAL LOW (ref >60)
Glucose, Bld: 163 mg/dL — ABNORMAL HIGH (ref 70–99)
Potassium: 3.7 mmol/L (ref 3.5–5.1)
Sodium: 129 mmol/L — ABNORMAL LOW (ref 135–145)

## 2020-11-09 LAB — CBC
HCT: 26.3 % — ABNORMAL LOW (ref 36.0–46.0)
Hemoglobin: 8.7 g/dL — ABNORMAL LOW (ref 12.0–15.0)
MCH: 30.7 pg (ref 26.0–34.0)
MCHC: 33.1 g/dL (ref 30.0–36.0)
MCV: 92.9 fL (ref 80.0–100.0)
Platelets: 338 10*3/uL (ref 150–400)
RBC: 2.83 MIL/uL — ABNORMAL LOW (ref 3.87–5.11)
RDW: 16.4 % — ABNORMAL HIGH (ref 11.5–15.5)
WBC: 10.7 10*3/uL — ABNORMAL HIGH (ref 4.0–10.5)
nRBC: 0.4 % — ABNORMAL HIGH (ref 0.0–0.2)

## 2020-11-09 LAB — GLUCOSE, CAPILLARY: Glucose-Capillary: 147 mg/dL — ABNORMAL HIGH (ref 70–99)

## 2020-11-09 LAB — PROTIME-INR
INR: 1.1 (ref 0.8–1.2)
Prothrombin Time: 13.5 seconds (ref 11.4–15.2)

## 2020-11-09 MED ORDER — SACCHAROMYCES BOULARDII 250 MG PO CAPS: 250.0000 mg | ORAL_CAPSULE | Freq: Two times a day (BID) | ORAL | 0 refills | Status: AC

## 2020-11-09 MED ORDER — AMOXICILLIN-POT CLAVULANATE 500-125 MG PO TABS: 1.0000 | ORAL_TABLET | Freq: Two times a day (BID) | ORAL | 0 refills | Status: AC

## 2020-11-09 NOTE — Care Management Important Message (Signed)
Important Message  Patient Details IM Letter given to the Patient. Name: Ashley [name] MRN: [id number] Date of Birth: [birth year]   Medicare Important Message Given:  Yes     Kerin Salen 11/09/2020, 11:22 AM

## 2020-11-09 NOTE — Progress Notes (Signed)
Patient making repeated requests and sobbing not to be left alone in room. Despite this RN spending 21min to an hour providing conversation and comfort to patient, patient still demanding and tearful once left alone. Patient making paranoid statements occasionally such as asking if RN was "wired" with a listening device or lying to her about other duties demanding attention in the hospital. Speech is occasionally rapid and rambling. Assurance provided. Patient placed in recliner at bedside and made comfortable at her request. Patient able to ambulate with minimal assist with use of walker. Patient refusing medications and repositioning at this time.

## 2020-11-09 NOTE — Plan of Care (Signed)

## 2020-11-09 NOTE — Discharge Summary (Signed)
Physician Discharge Summary  Ashley [name] OVZ:[id number] DOB: 26-Jun-1946 DOA: 11/07/2020  PCP: Unk Pinto, MD  Admit date: 11/07/2020 Discharge date: 11/09/2020  Admitted From: Home Discharge disposition: Home with home health PT which patient refused   Code Status: Full Code  Diet Recommendation: Cardiac/diabetic diet  Discharge Diagnosis:   Principal Problem:   Symptomatic anemia Active Problems:   Essential hypertension   CKD stage 4 due to type 2 diabetes mellitus ([place])   Supratherapeutic INR   Hematoma of left thigh  History of Present Illness / Brief narrative:  Ashley [name] is a 75 y.o. female with PMH significant for T2DM, HTN, DVT on Coumadin, CKD stage IV, lung cancer in remission, vulvar cancer in remission, COPD, GERD, hiatal hernia, chronic iron deficiency, anxiety/bipolar disorderwho was recently hospitalized 3/11-3/13 for AKI on CKD symptomatic anemia, given 1 unit PRBC and discharged to home.   Patient presented to the ED on 3/23 with complaint of left lower extremity pain after a fall. On Sunday night 3/20, patient fell landing on her buttocks after which she has an area of ecchymosis. Since then she has significant pain and difficulty bearing weight on her left leg because of which she came to the ED.  In the ED, sodium was low at 128, serum bicarb low at 17, BUN/creatinine 62/4.05, baseline between 3 and 4, hemoglobin low at 5.3, INR elevated to 8.2. Urinalysis showed yellow turbid urine with moderate amount of leukocytes and many bacteria. Left hip x-ray did not show any fracture dislocation but showed large amount of soft tissue gas from the urinary bladder concerning for emphysematous cystitis. Left knee x-ray showed chondrocalcinosis medially and laterally consistent with calcium pyrophosphate deposition or early arthritis. CT left femur without contrast showed large multiloculated collection involving the left vastus lateralis muscle measuring 6.7 x 3.4 x  21.6 cm with smaller mixed density collections involving the left gluteus medius and maximus muscles consistent with intramuscular hematomas  Patient was admitted to hospitalist service. See below for details.  Subjective:  Seen and examined this morning.  Pleasant elderly Caucasian female.  Sitting up in bed.  Not in distress.  Pain controlled.  Was able to work with PT yesterday.  Hospital Course:  Left lower extremity pain Large multiloculated hematoma -Secondary to a fall while on Coumadin -Not concerning for compartment syndrome at this time. -Coumadin on hold. -Admitting MD discussed with Ortho on-call Dr. Lynann Bologna recommend compression wrap from groin to ankle and conservative management for now -Coumadin on hold. -PT eval obtained.  Home health PT recommended which patient refused. -Currently only on Tylenol for pain management.  Patient does not want try any opioids  Acute normocytic anemia -Probably secondary to bleeding into large hematoma. -On her recent hospitalization, FOBT was negative. -Her hemoglobin prior to the admission in February was consistently over 10. -Presented with low hemoglobin of 4.9 this admission. -2 units PRBCs transfused.  Improvement in hemoglobin to 8.7 today.  Stable last 24 hours Recent Labs    10/10/20 0438 10/10/20 0447 10/11/20 0317 10/26/20 1435 10/27/20 0507 10/28/20 0540 11/07/20 1028 11/07/20 1605 11/08/20 0346 11/09/20 0339  HGB 11.7*  --    < > 7.6*   < > 9.5* 5.3* 4.9* 8.3* 8.7*  MCV 85.6  --    < > 94.8   < > 92.0 97.7  --  92.6 92.9  VITAMINB12 1,461*  --   --  470  --   --   --   --   --   --  FOLATE  --  11.1  --  9.5  --   --   --   --   --   --   FERRITIN 180  --   --  152  --   --   --   --   --   --   TIBC 241*  --   --  295  --   --   --   --   --   --   IRON 138  --   --  76  --   --   --   --   --   --   RETICCTPCT  --  1.6  --  3.8*  --   --   --   --   --   --    < > = values in this interval not displayed.    Emphysematous cystitis and UTI -Urinalysis suggestive of UTI -CT abdomen on admission showed massive volume of air in the walls of the urinary bladder consistent with emphysematous cystitis. -Admitting MD discussed the case with urologist Dr. Diona Fanti who recommended starting on Augmentin and repeat CT in 1 week. -At discharge, given a prescription for Augmentin for next 10 days with probiotics and to follow-up with Dr. Diona Fanti in 1 to 2 weeks.  History of DVT on Coumadin Supratherapeutic INR -INR was elevated to 8.2 on admission.  Reversed with vitamin K.   -Chart reviewed. Follows up with Dr. Debara Pickett had an outpatient. From the office note from September 2021, patient had nonresolving right femoral DVT with associated edema and failed half dose of Eliquis in the past. Because of this, she was placed on Coumadin.  -Repeat DVT scan on her last admission noted resolution of the previously noted thrombus.  After discussion with patient and family, I resumed Coumadin at last discharge.  Obviously with new fall and intramuscular bleeding, Coumadin is on hold.   -I discussed this case with Dr. Debara Pickett on 3/24.  Recommended to hold Coumadin at discharge and follow-up with him in the clinic next few weeks.  Since patient does not have any active thrombus, there would be no indication of IVC filter at this time.   Recent Labs  Lab 11/07/20 1028 11/08/20 0346 11/09/20 0339  INR 8.2* 1.1 1.1   Uncontrolled type 2 diabetes mellitus  -A1c 8.6 on 10/10/2020 -At home, patient uses Novolin15 units in the morning and 25 units in the night based on blood sugar. -Patient would like to continue her home regimen post discharge. Recent Labs  Lab 11/08/20 1128 11/08/20 1616 11/08/20 2029 11/09/20 0812  GLUCAP 343* 264* 188* 147*   CKD 4 -Baseline creatinine between 3 and 4.  Follows up with Dr. [name] as an outpatient. -On last discharge, patient was started on Bumex 1 mg daily as  needed. -Creatinine this admission was slightly elevated above baseline but back to normal this morning. -Renal ultrasound from 2/24 was unremarkable. Recent Labs    10/12/20 0301 10/13/20 0311 10/14/20 0431 10/25/20 1556 10/26/20 1435 10/27/20 0507 10/28/20 0540 11/07/20 1028 11/08/20 0346 11/09/20 0339  BUN 63* 58* 49* 41* 35* 46* 51* 62* 63* 61*  CREATININE 3.67* 3.48* 3.01* 3.51* 3.65* 3.47* 3.88* 4.05* 3.63* 3.56*   Essential hypertension -Home meds include amlodipine, hydralazine and PRN Bumex. -Resume amlodipine and hydralazine.  Hyponatremia -Low but remains stable. Recent Labs  Lab 11/07/20 1028 11/08/20 0346 11/09/20 0339  NA 128* 130* 129*   Chronic hypokalemia -Continue supplement. Recent  Labs  Lab 11/07/20 1028 11/08/20 0346 11/09/20 0339  K 4.2 3.7 3.7   Chronic constipation -Continue bowel regimen with Senokot, MiraLAX as needed.  COPD -Currently no wheezing,patient has stopped smoking last week. Declined nicotine patch.  Hypothyroidism -Continue Synthroid  GERD -Continue PPI  Peripheral neuropathy Depression -Continue antidepressants. Patient states her peripheral neuropathy medsareadjustedas an outpatientby psychiatrist. Continue to follow.  Stable for discharge to home today.  Wound care:    Discharge Exam:   Vitals:   11/08/20 0828 11/08/20 1443 11/08/20 2031 11/09/20 0502  BP: (!) 146/78 140/68 (!) 148/66 (!) 168/86  Pulse: 92 87 90 91  Resp:  14 18 20   Temp:  97.9 F (36.6 C) 98.5 F (36.9 C) 97.7 F (36.5 C)  TempSrc:   Oral Oral  SpO2: 96% 97% 94% 97%  Weight:      Height:        Body mass index is 27.46 kg/m.  General exam: Pleasant, elderly.  Not in distress Skin: No rashes, lesions or ulcers. HEENT: Atraumatic, normocephalic, no obvious bleeding Lungs: Clear to auscultation bilaterally CVS: Regular rate and rhythm, no murmur GI/Abd soft, nontender, nondistended, bowel sound present CNS: Alert,  awake, oriented x3 Psychiatry: Mood appropriate Extremities: No pedal edema, no calf tenderness  Follow ups:   Discharge Instructions    Diet - low sodium heart healthy   Complete by: As directed    Diet Carb Modified   Complete by: As directed    Increase activity slowly   Complete by: As directed       Follow-up Information    Unk Pinto, MD Follow up.   Specialty: Internal Medicine Contact information: [address] [place] [address] [phone]        Justin Mend, MD Follow up.   Specialty: Internal Medicine Contact information: [address] [place] [address] [phone]        Pixie Casino, MD Follow up.   Specialty: Cardiology Contact information: [place] [address] [phone]        Franchot Gallo, MD Follow up.   Specialty: Urology Contact information: [place] [address] [phone]               Recommendations for Outpatient Follow-Up:   1. Follow-up with PCP as an outpatient  Discharge Instructions:  Follow with Primary MD Unk Pinto, MD in 7 days   Get CBC/BMP checked in next visit within 1 week by PCP or SNF MD ( we routinely change or add medications that can affect your baseline labs and fluid status, therefore we recommend that you get the mentioned basic workup next visit with your PCP, your PCP may decide not to get them or add new tests based on their clinical decision)  On your next visit with your PCP, please Get Medicines reviewed and adjusted.  Please request your PCP  to go over all Hospital Tests and Procedure/Radiological results at the follow up, please get all Hospital records sent to your Prim MD by signing hospital release before you go home.  Activity: As tolerated with Full fall precautions use walker/cane & assistance as needed  For Heart failure patients - Check your Weight same time everyday, if you gain over 2  pounds, or you develop in leg swelling, experience more shortness of breath or chest pain, call your Primary MD immediately. Follow Cardiac Low Salt Diet and 1.5 lit/day fluid restriction.  If you have smoked or chewed  Tobacco in the last 2 yrs please stop smoking, stop any regular Alcohol  and or any Recreational drug use.  If you experience worsening of your admission symptoms, develop shortness of breath, life threatening emergency, suicidal or homicidal thoughts you must seek medical attention immediately by calling 911 or calling your MD immediately  if symptoms less severe.  You Must read complete instructions/literature along with all the possible adverse reactions/side effects for all the Medicines you take and that have been prescribed to you. Take any new Medicines after you have completely understood and accpet all the possible adverse reactions/side effects.   Do not drive, operate heavy machinery, perform activities at heights, swimming or participation in water activities or provide baby sitting services if your were admitted for syncope or siezures until you have seen by Primary MD or a Neurologist and advised to do so again.  Do not drive when taking Pain medications.  Do not take more than prescribed Pain, Sleep and Anxiety Medications  Wear Seat belts while driving.   Please note You were cared for by a hospitalist during your hospital stay. If you have any questions about your discharge medications or the care you received while you were in the hospital after you are discharged, you can call the unit and asked to speak with the hospitalist on call if the hospitalist that took care of you is not available. Once you are discharged, your primary care physician will handle any further medical issues. Please note that NO REFILLS for any discharge medications will be authorized once you are discharged, as it is imperative that you return to your primary care physician (or establish a  relationship with a primary care physician if you do not have one) for your aftercare needs so that they can reassess your need for medications and monitor your lab values.    Allergies as of 11/09/2020      Reactions   Sulfa Antibiotics Other (See Comments)   Pt states she had "extreme pain"   Sulfacetamide Sodium Other (See Comments)   Pain      Medication List    STOP taking these medications   aspirin EC 81 MG tablet   warfarin 5 MG tablet Commonly known as: COUMADIN     TAKE these medications   acetaminophen 500 MG tablet Commonly known as: TYLENOL Take 1,000 mg by mouth every 6 (six) hours as needed for mild pain or headache.   amLODipine 10 MG tablet Commonly known as: NORVASC Take 1 tablet (10 mg total) by mouth daily.   amoxicillin-clavulanate 500-125 MG tablet Commonly known as: AUGMENTIN Take 1 tablet (500 mg total) by mouth 2 (two) times daily for 10 days.   atorvastatin 40 MG tablet Commonly known as: LIPITOR TAKE 1 TABLET BY MOUTH EVERY DAY FOR CHOLESTEROL What changed:   how much to take  how to take this  when to take this  additional instructions   azelastine 0.1 % nasal spray Commonly known as: ASTELIN PLACE 2 SPRAYS INTO BOTH NOSTRILS 2 (TWO) TIMES DAILY. USE IN EACH NOSTRIL AS DIRECTED What changed:   when to take this  reasons to take this  additional instructions   bumetanide 1 MG tablet Commonly known as: BUMEX Take 1 tablet (1 mg total) by mouth daily as needed.   clorazepate 7.5 MG tablet Commonly known as: Tranxene-T Take    1/2 to 1 tablet    1 or 2 x /day    ONLY if needed for  Nerves What changed:   how much to take  how to take this  when to take this  reasons to take this  additional instructions   famotidine 20 MG tablet Commonly known as: PEPCID TAKE 1 TABLET TWICE A DAY WITH MEALS FOR INDIGESTION. What changed:   how much to take  how to take this  when to take this  additional instructions    fenofibrate 145 MG tablet Commonly known as: TRICOR TAKE 1 TABLET DAILY FOR TRIGLYCERIDES (BLOOD FATS) What changed: See the new instructions.   ferrous sulfate 325 (65 FE) MG tablet TAKE 1 TABLET 2 TIMES A DAY WITH A MEAL FOR IRON DEFICIENCY What changed: See the new instructions.   FLUoxetine 20 MG capsule Commonly known as: PROZAC Take 20 mg by mouth daily.   hydrALAZINE 100 MG tablet Commonly known as: APRESOLINE Take 1 tablet (100 mg total) by mouth every 8 (eight) hours.   levothyroxine 88 MCG tablet Commonly known as: SYNTHROID Take   1 tablet    Daily   on an empty stomach with only water for 30 minutes & no Antacid meds, Calcium or Magnesium for 4 hours & avoid Biotin What changed:   how much to take  how to take this  when to take this  additional instructions   multivitamin with minerals tablet Take 1 tablet by mouth daily.   NovoLIN N 100 UNIT/ML injection Generic drug: insulin NPH Human INJECT 50 UNITS EVERY MORNING AND 25 UNITS EVERY EVENING What changed: See the new instructions.   nystatin cream Commonly known as: MYCOSTATIN Apply 1 application topically daily as needed for dry skin.   OneTouch Ultra test strip Generic drug: glucose blood TEST BLOOD SUGAR 3 TIMES A DAY   polyethylene glycol 17 g packet Commonly known as: MIRALAX / GLYCOLAX Take 17 g by mouth daily. What changed:   when to take this  reasons to take this   potassium chloride 10 MEQ tablet Commonly known as: Klor-Con M10 TAKE 1 TABLET 3 X /DAY WITH MEALS FOR POTASSIUM What changed:   how much to take  how to take this  when to take this  additional instructions   saccharomyces boulardii 250 MG capsule Commonly known as: FLORASTOR Take 1 capsule (250 mg total) by mouth 2 (two) times daily for 10 days.   vitamin C 500 MG tablet Commonly known as: ASCORBIC ACID Take 500 mg by mouth daily. Take with iron/ferrous sulfate   Vitamin D 125 MCG (5000 UT) Caps Take  5,000 Units by mouth in the morning and at bedtime.       Time coordinating discharge: 35 minutes  The results of significant diagnostics from this hospitalization (including imaging, microbiology, ancillary and laboratory) are listed below for reference.    Procedures and Diagnostic Studies:   CT Abdomen Pelvis Wo Contrast  Result Date: 11/07/2020 CLINICAL DATA:  Left leg and foot pain for 1 week. EXAM: CT ABDOMEN AND PELVIS WITHOUT CONTRAST TECHNIQUE: Multidetector CT imaging of the abdomen and pelvis was performed following the standard protocol without IV contrast. COMPARISON:  CT abdomen and pelvis 09/19/2020. FINDINGS: Lower chest: Right greater than left small pleural effusions are new since the prior exam. There is associated compressive atelectasis. Opacity in the right lung base with adjacent fiducial markers is unchanged. Hepatobiliary: No focal liver abnormality is seen. No gallstones, gallbladder wall thickening, or biliary dilatation. Pancreas: Unremarkable. No pancreatic ductal dilatation or surrounding inflammatory changes. Spleen: Normal in size without focal abnormality.  Adrenals/Urinary Tract: There is a massive volume of air in the walls of the urinary bladder consistent with emphysematous cystitis. The ureters and renal collecting systems are unremarkable. Vascular calcifications versus punctate nonobstructing stones in both kidneys are unchanged. Stomach/Bowel: Stomach is within normal limits. Appendix appears normal. No evidence of bowel wall thickening, distention, or inflammatory changes. Vascular/Lymphatic: Extensive atherosclerosis.  No lymphadenopathy. Reproductive: Status post hysterectomy. No adnexal masses. Other: There is some presacral edema which is new since the prior exam. Musculoskeletal: A fluid collection with a hematocrit level in the left gluteus maximus measures 2.2 x 4.8 cm in the axial plane. A second fluid collection along the periphery of the left gluteus  medius measures 2.7 x 2.1 cm. There is also a heterogeneous fluid collection with hematocrit levels in the left vastus lateralis measuring 6.4 X 2.7 cm. This collection is incompletely visualized. Fluid collection in the subcutaneous fat adjacent to the right greater trochanter measures 5.0 x 3.6 cm. All of these collections are new since the prior exam. No acute bony abnormality is identified. IMPRESSION: Massive volume of air in the walls of the urinary bladder consistent with emphysematous cystitis. New small bilateral pleural effusions, greater on the right, with associated basilar atelectasis. New fluid collections in the left gluteal and left vastus lateralis muscles have an appearance consistent with hematomas. Fluid collection subcutaneous fat adjacent to the right greater trochanter is also likely hematoma. No gas is seen within any of the collections but super infection is possible. Aortic Atherosclerosis (ICD10-I70.0). Electronically Signed   By: Inge Rise M.D.   On: 11/07/2020 14:03   CT FEMUR LEFT WO CONTRAST  Result Date: 11/07/2020 CLINICAL DATA:  Left leg pain and swelling for the past week and a half. EXAM: CT OF THE LOWER LEFT EXTREMITY WITHOUT CONTRAST TECHNIQUE: Multidetector CT imaging of the lower left extremity was performed according to the standard protocol. COMPARISON:  Left hip and knee x-rays from same day. FINDINGS: Bones/Joint/Cartilage No fracture or dislocation. Mild left hip and knee osteoarthritis with chondrocalcinosis. No joint effusion. Ligaments Ligaments are suboptimally evaluated by CT. Muscles and Tendons Large multiloculated mixed density fluid collection involving the left vastus lateralis muscle measuring 6.7 x 3.4 x 21.6 cm (AP by transverse by CC) with fluid-fluid levels. Smaller mixed density collections involving the left gluteus medius and maximus muscles. From the gluteus maximus collection also demonstrates a fluid fluid level (series 3, image 47). Soft  tissue Moderate soft tissue swelling of the left thigh. No soft tissue mass. Extensive air within the bladder wall. IMPRESSION: 1. Large multiloculated mixed density fluid collection involving the left vastus lateralis muscle measuring 6.7 x 3.4 x 21.6 cm with smaller mixed density collections involving the left gluteus medius and maximus muscles, consistent with intramuscular hematomas. 2. Emphysematous cystitis. 3. No acute osseous abnormality. Electronically Signed   By: Titus Dubin M.D.   On: 11/07/2020 13:55   DG Knee Complete 4 Views Left  Result Date: 11/07/2020 CLINICAL DATA:  Left knee pain after fall. EXAM: LEFT KNEE - COMPLETE 4+ VIEW COMPARISON:  None. FINDINGS: No evidence of fracture, dislocation, or joint effusion. No significant joint space narrowing is noted. Chondrocalcinosis is noted medially and laterally consistent with calcium pyrophosphate deposition disease or early degenerative joint disease. Soft tissues are unremarkable. IMPRESSION: Chondrocalcinosis is noted medially and laterally consistent with calcium pyrophosphate deposition disease or early degenerative joint disease. No acute abnormality seen in the left knee. Electronically Signed   By: Bobbe Medico.D.  On: 11/07/2020 12:30   DG Hip Unilat With Pelvis 2-3 Views Left  Result Date: 11/07/2020 CLINICAL DATA:  Acute left leg pain. EXAM: DG HIP (WITH OR WITHOUT PELVIS) 2-3V LEFT COMPARISON:  None. FINDINGS: There is no evidence of hip fracture or dislocation. There is no evidence of arthropathy or other focal bone abnormality. However, large amount of soft tissue gas is seen around the expected position of the urinary bladder, concerning for emphysematous cystitis. IMPRESSION: No fracture or dislocation is noted. Large amount of soft tissue gas is seen around the expected position of the urinary bladder, concerning for emphysematous cystitis. CT scan is recommended for further evaluation. Electronically Signed   By:  Marijo Conception M.D.   On: 11/07/2020 12:27   VAS [place] LOWER EXTREMITY VENOUS (DVT) ([facility] and [facility] 7a-7p)  Result Date: 11/07/2020  Lower Venous DVT Study Indications: Swelling.  Comparison Study: 10/27/20 previous Performing Technologist: Abram Sander RVS  Examination Guidelines: A complete evaluation includes B-mode imaging, spectral Doppler, color Doppler, and power Doppler as needed of all accessible portions of each vessel. Bilateral testing is considered an integral part of a complete examination. Limited examinations for reoccurring indications may be performed as noted. The reflux portion of the exam is performed with the patient in reverse Trendelenburg.  +-----+---------------+---------+-----------+----------+--------------+ RIGHTCompressibilityPhasicitySpontaneityPropertiesThrombus Aging +-----+---------------+---------+-----------+----------+--------------+ CFV  Full           Yes      Yes                                 +-----+---------------+---------+-----------+----------+--------------+   +---------+---------------+---------+-----------+----------+--------------+ LEFT     CompressibilityPhasicitySpontaneityPropertiesThrombus Aging +---------+---------------+---------+-----------+----------+--------------+ CFV      Full           Yes      Yes                                 +---------+---------------+---------+-----------+----------+--------------+ SFJ      Full                                                        +---------+---------------+---------+-----------+----------+--------------+ FV Prox  Full                                                        +---------+---------------+---------+-----------+----------+--------------+ FV Mid   Full                                                        +---------+---------------+---------+-----------+----------+--------------+ FV DistalFull                                                         +---------+---------------+---------+-----------+----------+--------------+ PFV      Full                                                        +---------+---------------+---------+-----------+----------+--------------+  POP      Full           Yes      Yes                                 +---------+---------------+---------+-----------+----------+--------------+ PTV      Full                                                        +---------+---------------+---------+-----------+----------+--------------+ PERO     Full                                                        +---------+---------------+---------+-----------+----------+--------------+     Summary: RIGHT: - No evidence of common femoral vein obstruction.  LEFT: - There is no evidence of deep vein thrombosis in the lower extremity.  - No cystic structure found in the popliteal fossa.  *See table(s) above for measurements and observations. Electronically signed by Curt Jews MD on 11/07/2020 at 2:09:42 PM.    Final      Labs:   Basic Metabolic Panel: Recent Labs  Lab 11/07/20 1028 11/08/20 0346 11/09/20 0339  NA 128* 130* 129*  K 4.2 3.7 3.7  CL 98 99 99  CO2 17* 19* 18*  GLUCOSE 101* 245* 163*  BUN 62* 63* 61*  CREATININE 4.05* 3.63* 3.56*  CALCIUM 8.6* 8.6* 8.6*   GFR Estimated Creatinine Clearance: 13.2 mL/min (A) (by C-G formula based on SCr of 3.56 mg/dL (H)). Liver Function Tests: Recent Labs  Lab 11/07/20 1028  AST 34  ALT 18  ALKPHOS 44  BILITOT 1.0  PROT 5.7*  ALBUMIN 2.9*   No results for input(s): LIPASE, AMYLASE in the last 168 hours. No results for input(s): AMMONIA in the last 168 hours. Coagulation profile Recent Labs  Lab 11/07/20 1028 11/08/20 0346 11/09/20 0339  INR 8.2* 1.1 1.1    CBC: Recent Labs  Lab 11/07/20 1028 11/07/20 1605 11/08/20 0346 11/09/20 0339  WBC 13.0*  --  9.8 10.7*  NEUTROABS 10.7*  --   --   --   HGB 5.3* 4.9* 8.3* 8.7*  HCT 17.0* 15.5*  25.1* 26.3*  MCV 97.7  --  92.6 92.9  PLT 400  --  334 338   Cardiac Enzymes: No results for input(s): CKTOTAL, CKMB, CKMBINDEX, TROPONINI in the last 168 hours. BNP: Invalid input(s): POCBNP CBG: Recent Labs  Lab 11/08/20 1128 11/08/20 1616 11/08/20 2029 11/09/20 0812  GLUCAP 343* 264* 188* 147*   D-Dimer No results for input(s): DDIMER in the last 72 hours. Hgb A1c No results for input(s): HGBA1C in the last 72 hours. Lipid Profile No results for input(s): CHOL, HDL, LDLCALC, TRIG, CHOLHDL, LDLDIRECT in the last 72 hours. Thyroid function studies No results for input(s): TSH, T4TOTAL, T3FREE, THYROIDAB in the last 72 hours.  Invalid input(s): FREET3 Anemia work up No results for input(s): VITAMINB12, FOLATE, FERRITIN, TIBC, IRON, RETICCTPCT in the last 72 hours. Microbiology Recent Results (from the past 240 hour(s))  SARS CORONAVIRUS 2 (TAT 6-24 HRS) Nasopharyngeal Nasopharyngeal Swab     Status: None  Collection Time: 11/07/20 11:47 AM   Specimen: Nasopharyngeal Swab  Result Value Ref Range Status   SARS Coronavirus 2 NEGATIVE NEGATIVE Final    Comment: (NOTE) SARS-CoV-2 target nucleic acids are NOT DETECTED.  The SARS-CoV-2 RNA is generally detectable in upper and lower respiratory specimens during the acute phase of infection. Negative results do not preclude SARS-CoV-2 infection, do not rule out co-infections with other pathogens, and should not be used as the sole basis for treatment or other patient management decisions. Negative results must be combined with clinical observations, patient history, and epidemiological information. The expected result is Negative.  Fact Sheet for Patients: SugarRoll.be  Fact Sheet for Healthcare Providers: https://www.woods-mathews.com/  This test is not yet approved or cleared by the [place] FDA and  has been authorized for detection and/or diagnosis of SARS-CoV-2 by FDA  under an Emergency Use Authorization (EUA). This EUA will remain  in effect (meaning this test can be used) for the duration of the COVID-19 declaration under Se ction 564(b)(1) of the Act, 21 U.S.C. section 360bbb-3(b)(1), unless the authorization is terminated or revoked sooner.  Performed at [facility], [place] [address], [place], [place] [address]   Urine culture     Status: Abnormal (Preliminary result)   Collection Time: 11/07/20 12:55 PM   Specimen: Urine, Clean Catch  Result Value Ref Range Status   Specimen Description   Final    Urine Performed at [facility] [address] [place], [place] [address]    Special Requests   Final    NONE Performed at [facility], [place] [address], [place], [place] [address]    Culture (A)  Final    >=100,000 COLONIES/mL GRAM NEGATIVE RODS SUSCEPTIBILITIES TO FOLLOW Performed at [facility], [place] [address], [place], [place] [address]    Report Status PENDING  Incomplete     Signed: Marlowe Aschoff Dahal  [facility] 11/09/2020, 11:54 AM

## 2020-11-10 LAB — URINE CULTURE: Culture: >=100000 — AB

## 2020-11-10 NOTE — Progress Notes (Signed)
Patient was discharged home from hospital on Augmentin til sees Dr Diona Fanti

## 2020-11-13 ENCOUNTER — Telehealth: Payer: Self-pay | Admitting: *Deleted

## 2020-11-13 NOTE — Telephone Encounter (Signed)
Called patient on 11/13/2020 , 4:26 PM in an attempt to reach the patient for a hospital follow up. Spoke with patient, who states she has trouble walking.  Admit date: 11/07/20 Discharge: 11/09/20   She does not have any questions or concerns about medications from the hospital admission. The patient's medications were reviewed over the phone, they were counseled to bring in all current medications to the hospital follow up visit. Patient understands to hold ASA 81 mg and Coumadin.   I advised the patient to call if any questions or concerns arise about the hospital admission or medications    Home health was not started in the hospital. Patient declined physical therapy by home health. All questions were answered and a follow up appointment was made. Appointment for a hospital follow up on 11/14/2020 with Dr Melford Aase.  Prior to Admission medications   Medication Sig Start Date End Date Taking? Authorizing Provider  acetaminophen (TYLENOL) 500 MG tablet Take 1,000 mg by mouth every 6 (six) hours as needed for mild pain or headache.    [provider]  amLODipine (NORVASC) 10 MG tablet Take 1 tablet (10 mg total) by mouth daily. 07/04/19   Cristal Deer, MD  amoxicillin-clavulanate (AUGMENTIN) 500-125 MG tablet Take 1 tablet (500 mg total) by mouth 2 (two) times daily for 10 days. 11/09/20 11/19/20  Terrilee Croak, MD  atorvastatin (LIPITOR) 40 MG tablet TAKE 1 TABLET BY MOUTH EVERY DAY FOR CHOLESTEROL Patient taking differently: Take 40 mg by mouth daily. 09/25/20   Liane Comber, NP  azelastine (ASTELIN) 0.1 % nasal spray PLACE 2 SPRAYS INTO BOTH NOSTRILS 2 (TWO) TIMES DAILY. USE IN EACH NOSTRIL AS DIRECTED Patient taking differently: Place 2 sprays into both nostrils 2 (two) times daily as needed for allergies. 05/29/20   Unk Pinto, MD  bumetanide (BUMEX) 1 MG tablet Take 1 tablet (1 mg total) by mouth daily as needed. 10/28/20 01/26/21  Terrilee Croak, MD  Cholecalciferol (VITAMIN  D) 125 MCG (5000 UT) CAPS Take 5,000 Units by mouth in the morning and at bedtime.    [provider]  clorazepate (TRANXENE-T) 7.5 MG tablet Take    1/2 to 1 tablet    1 or 2 x /day    ONLY if needed for  Nerves Patient taking differently: Take 7.5 mg by mouth 2 (two) times daily as needed for anxiety or sleep. 05/22/20   Unk Pinto, MD  famotidine (PEPCID) 20 MG tablet TAKE 1 TABLET TWICE A DAY WITH MEALS FOR INDIGESTION. Patient taking differently: Take 20 mg by mouth 2 (two) times daily. 11/30/19   Unk Pinto, MD  fenofibrate (TRICOR) 145 MG tablet TAKE 1 TABLET DAILY FOR TRIGLYCERIDES (BLOOD FATS) Patient taking differently: Take 145 mg by mouth daily. 08/22/20   Unk Pinto, MD  ferrous sulfate 325 (65 FE) MG tablet TAKE 1 TABLET 2 TIMES A DAY WITH A MEAL FOR IRON DEFICIENCY Patient taking differently: Take 325 mg by mouth 2 (two) times daily with a meal. 10/10/20   McClanahan, Danton Sewer, NP  FLUoxetine (PROZAC) 20 MG capsule Take 20 mg by mouth daily.    [provider]  hydrALAZINE (APRESOLINE) 100 MG tablet Take 1 tablet (100 mg total) by mouth every 8 (eight) hours. 10/14/20 01/12/21  Terrilee Croak, MD  levothyroxine (SYNTHROID) 88 MCG tablet Take   1 tablet    Daily   on an empty stomach with only water for 30 minutes & no Antacid meds, Calcium or Magnesium for 4 hours &  avoid Biotin Patient taking differently: Take 88 mcg by mouth daily before breakfast. 09/05/20   Unk Pinto, MD  Multiple Vitamins-Minerals (MULTIVITAMIN WITH MINERALS) tablet Take 1 tablet by mouth daily.    [provider]  NOVOLIN N 100 UNIT/ML injection INJECT 50 UNITS EVERY MORNING AND 25 UNITS EVERY EVENING Patient taking differently: Inject 25-50 Units into the skin 3 (three) times daily as needed (high blood sugar). Injects 25-50 units into the skin after checking CBG daily (CBG 160-299 mg/dL: 25 units; CBG >300 mg/dl: 50 units); gives extra 25 units in the evening if taking a  big meal 05/09/20   Unk Pinto, MD  nystatin cream (MYCOSTATIN) Apply 1 application topically daily as needed for dry skin. 10/17/20   [provider]  [facility] ULTRA test strip TEST BLOOD SUGAR 3 TIMES A DAY 01/27/20   Vicie Mutters R, PA-C  polyethylene glycol (MIRALAX / GLYCOLAX) 17 g packet Take 17 g by mouth daily. Patient taking differently: Take 17 g by mouth daily as needed for mild constipation. 03/06/20   Vladimir Crofts, PA-C  potassium chloride (KLOR-CON M10) 10 MEQ tablet TAKE 1 TABLET 3 X /DAY WITH MEALS FOR POTASSIUM Patient taking differently: Take 10 mEq by mouth 3 (three) times daily. 01/08/20   Unk Pinto, MD  saccharomyces boulardii (FLORASTOR) 250 MG capsule Take 1 capsule (250 mg total) by mouth 2 (two) times daily for 10 days. 11/09/20 11/19/20  Terrilee Croak, MD  vitamin C (ASCORBIC ACID) 500 MG tablet Take 500 mg by mouth daily. Take with iron/ferrous sulfate    [provider]

## 2020-11-14 ENCOUNTER — Ambulatory Visit (INDEPENDENT_AMBULATORY_CARE_PROVIDER_SITE_OTHER): Payer: HMO | Admitting: Internal Medicine

## 2020-11-14 ENCOUNTER — Encounter: Payer: Self-pay | Admitting: Internal Medicine

## 2020-11-14 ENCOUNTER — Other Ambulatory Visit: Payer: Self-pay

## 2020-11-14 VITALS — BP 138/78 | HR 89 | Temp 97.7°F | Resp 20 | Ht 63.5 in | Wt 162.2 lb

## 2020-11-14 DIAGNOSIS — N185 Chronic kidney disease, stage 5: Secondary | ICD-10-CM

## 2020-11-14 DIAGNOSIS — R791 Abnormal coagulation profile: Secondary | ICD-10-CM

## 2020-11-14 DIAGNOSIS — Z794 Long term (current) use of insulin: Secondary | ICD-10-CM

## 2020-11-14 DIAGNOSIS — E1122 Type 2 diabetes mellitus with diabetic chronic kidney disease: Secondary | ICD-10-CM

## 2020-11-14 DIAGNOSIS — S7012XS Contusion of left thigh, sequela: Secondary | ICD-10-CM

## 2020-11-14 DIAGNOSIS — I82411 Acute embolism and thrombosis of right femoral vein: Secondary | ICD-10-CM

## 2020-11-14 DIAGNOSIS — E1165 Type 2 diabetes mellitus with hyperglycemia: Secondary | ICD-10-CM

## 2020-11-14 DIAGNOSIS — D6869 Other thrombophilia: Secondary | ICD-10-CM

## 2020-11-14 DIAGNOSIS — D649 Anemia, unspecified: Secondary | ICD-10-CM

## 2020-11-14 DIAGNOSIS — I10 Essential (primary) hypertension: Secondary | ICD-10-CM | POA: Diagnosis not present

## 2020-11-14 DIAGNOSIS — Z79899 Other long term (current) drug therapy: Secondary | ICD-10-CM

## 2020-11-14 DIAGNOSIS — Z7901 Long term (current) use of anticoagulants: Secondary | ICD-10-CM

## 2020-11-14 MED ORDER — GLIMEPIRIDE 4 MG PO TABS: ORAL_TABLET | ORAL | 1 refills | Status: DC

## 2020-11-14 NOTE — Patient Instructions (Signed)
Warfarin Coagulopathy  Warfarin coagulopathy refers to bleeding that may occur as a complication of the medicine warfarin. Warfarin is a blood thinner (anticoagulant). Anticoagulants prevent dangerous blood clots. Bleeding is the most common and most serious complication of warfarin. While taking warfarin, you will need to have blood tests (prothrombin tests, or PT tests) regularly to measure your blood clotting time. The PT test results will be reported as the International Normalized Ratio (INR). The INR tells your health care provider whether your dosage of warfarin needs to be changed. The longer it takes your blood to clot, the higher the INR. Your risk of warfarin coagulopathy increases as your INR increases.  What are the causes?  This condition may be caused by:  Taking too much warfarin (overdose).  Underlying medical conditions.  Changes to your diet.  Interactions with medicines, supplements, or alcohol.   What are the signs or symptoms?  Warfarin coagulopathy may cause bleeding from any tissue or organ. Symptoms may include:  Bleeding from the gums.  A nosebleed that is not easily stopped.  Blood in stool. This may look like bright red, dark, or black, tarry stools.  Blood in urine. This may look like pink, red, or brown urine.  Unusual bruising or bruising easily.  A cut that does not stop bleeding within 10 minutes.  Coughing up blood.  Vomiting blood.  Feeling nauseous for longer than 1 day.  Broken blood vessels in the eye (subconjunctival hemorrhage). This may look like a bright red or dark red patch on the white part of the eye.  Abdominal or back pain with or without bruising.  Sudden, severe headache.  Sudden weakness or numbness of the face, arm, or leg, especially on one side of the body.  Sudden confusion.  Difficulty speaking (aphasia) or understanding speech.  Sudden trouble seeing out of one or both eyes.  Unexpected difficulty  walking.  Dizziness.  Loss of balance or coordination.  Unusual vaginal bleeding.  Swelling or pain at an injection site.  Skin scarring due to tissue death (necrosis) of fatty tissue. This may cause pain in the waist, thighs, or buttocks. This is more common among women.   How is this diagnosed?  This condition is diagnosed after your health care provider places you on warfarin and then finds out how it affects your blood's ability to clot. Prothrombin time (PT) clotting tests are used to monitor your clotting factor. These tests also help your health care provider to find the warfarin dose that is best for you.  How is this treated?  This condition is treated with vitamin K. Vitamin K helps the blood to clot. You may receive vitamin K every 12 hours, or as needed. You may also receive donated plasma (transfusions of fresh frozen plasma). Plasma is the liquid part of blood, and contains substances that help the blood clot.   Follow these instructions at home:  Medicines   Take warfarin exactly as told by your health care provider. This ensures that you avoid bleeding or clots that could result in serious injury, pain, or disability.  Take your medicine at the same time every day. If you forget to take your dose of warfarin, take it as soon as you remember on that day. If you do not remember to take it on that day, do not take an extra dose the next day.    Contact your health care provider if you miss a dose or take an extra dose. Do not change your  dosage on your own to make up for missed or extra doses.    Talk with your health care provider or your pharmacist before starting or stopping any new medicines. Many prescription and over-the-counter medicines can interfere with warfarin. This includes over-the-counter vitamins, dietary supplements, herbal medicines, and pain medicines. Your warfarin dosage may need to be adjusted.   Eating and drinking   It is important to  maintain a normal, balanced diet while taking warfarin. Avoid major changes in your diet. If you are planning to change your diet, talk with your health care provider before making changes.  Your health care provider may recommend that you work with a diet and nutrition specialist (dietitian).  Vitamin K makes warfarin less effective. It is found in many foods. Eat a consistent amount of foods that contain vitamin K. For example, you may decide to eat 2 vitamin K-containing foods each day. Your warfarin dose is set according to the amount of vitamin K in your blood.  Eat a consistent amount of foods that contain vitamin K. Vitamin K makes warfarin less effective, so eating the same amount each day enables your health care provider to set the correct dose of warfarin. You may decide to eat 2 vitamin K-containing foods each day.   Tests   Make sure to have PT tests at least once every 4-6 weeks for the entire time you are taking warfarin.  Ask your health care provider what your target INR range is. Make sure you always know your target range. If your INR is not in your target range, your health care provider may adjust your dosage.   Preventing bleeding and injury   Some common over-the-counter medicines and supplements may increase the risk of bleeding while taking warfarin. They include: ? Acetaminophen. ? Aspirin. ? NSAIDs, such as ibuprofen or naproxen. ? Vitamin E.  Avoid situations that cause bleeding. You may bleed more easily while taking warfarin. To limit bleeding, take the following actions: ? Use a softer toothbrush. ? Floss with waxed floss, not unwaxed floss. ? Shave with an electric razor, not with a blade. ? Limit your use of sharp objects. ? Avoid potentially harmful activities, such as contact sports. ?  General instructions  Wear or carry identification that says that you are taking warfarin.  Make sure that all health care providers, including your dentist, know  that you are taking warfarin.  If you need surgery, tell your health care provider that you are taking warfarin. You may have to stop taking warfarin before your surgery.  If you plan to breastfeed or become pregnant while taking warfarin, talk with your health care provider.  Avoid alcohol, tobacco, and drugs.  ? If you change the amount of nicotine, tobacco, or alcohol you use, tell your health care provider. Keep all follow-up visits and lab visits as told by your health care provider. This is very important because warfarin is a medicine that needs to be closely monitored.  Contact a health care provider if:   You miss a dose.  You take an extra dose.  You plan to have any kind of surgery or procedure.  You are unable to take your medicine due to nausea, vomiting, or diarrhea.  You have any major changes in your diet, or you plan to make major changes in your diet.  You start or stop any over-the-counter medicine, prescription medicine, or dietary supplement..  You have menstrual periods that are heavier than usual, or unusual vaginal bleeding.  You have unusual bruising.  You lose your appetite.  You have a fever.  You have diarrhea that lasts for more than 24 hours   Get help right away if:   You develop symptoms of an allergic reaction, such as: ? Swelling of the lips, face, tongue, mouth, or throat. ? Rash. ? Itching. ? Itchy, red, swollen areas of skin (hives). ? Trouble breathing. ? Chest tightness. ?   You have any symptoms of stroke. BEFAST is an easy way to remember the main warning signs of stroke:   ? B - Balance. Signs are dizziness, sudden trouble walking, or loss of balance. ? E - Eye. Signs are trouble seeing or a sudden change in vision. ? F - Face. Signs are sudden weakness or numbness of the face, or the face or eyelid drooping on one side. ? A - Arm. Signs are weakness or numbness in an arm. This happens suddenly and usually on one side of  the body. ? S - Speech. Signs are trouble speaking, slurred speech, or trouble understanding speech. ? T - Time. Time to call emergency services. Write down the time your symptoms started. ?   You have other signs of stroke, such as: ? A sudden, severe headache with no known cause. ? Nausea or vomiting. ? Seizure. ?   You have signs or symptoms of a blood clot, such as: ? Pain or swelling in your leg or arm. ? Skin that is red or warm to the touch on your arm or leg. ? Shortness of breath or difficulty breathing. ? Chest pain. ? Unexplained fever. ?   You have: ? A fall or have an accident, especially if you hit your head. ? Blood in your urine. Your urine may look reddish, pinkish, or tea-colored. ? Blood in your stool. Your stool may be black or bright red. ? Bleeding that does not stop after applying pressure to the area for 30 minutes. ? Severe pain in your joints or back. ? Purple or blue toes. ? Skin ulcers that do not go away.    You vomit blood or cough up blood. The blood may be bright red, or it may look like coffee grounds.   These symptoms may represent a serious problem that is an emergency. Do not wait to see if the symptoms will go away. Get medical help right away. Call your local emergency services (911 in the [place] Do not drive yourself to the hospital.   Summary   Warfarin needs to be closely monitored with blood tests. It is very important to keep all lab visits and follow-up visits with your health care provider. Make sure you know your target INR range and your warfarin dosage.    Monitor how much vitamin K you eat every day. Try to eat the same amount every day.  Wear or carry identification that says that you are taking warfarin.    Take warfarin at the same time every day. Call your health care provider if you miss a dose or if you take an extra dose. Do not change the dosage of warfarin on your own.    Know the signs and symptoms of  blood clots, bleeding, and stroke. Know when to get emergency medical help.
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
## 2020-11-15 ENCOUNTER — Encounter: Payer: Self-pay | Admitting: *Deleted

## 2020-11-15 LAB — CBC WITH DIFFERENTIAL/PLATELET
Absolute Monocytes: 606 cells/uL (ref 200–950)
Basophils Absolute: 29 cells/uL (ref 0–200)
Basophils Relative: 0.4 %
Eosinophils Absolute: 29 cells/uL (ref 15–500)
Eosinophils Relative: 0.4 %
HCT: 26.4 % — ABNORMAL LOW (ref 35.0–45.0)
Hemoglobin: 8.7 g/dL — ABNORMAL LOW (ref 11.7–15.5)
Lymphs Abs: 504 cells/uL — ABNORMAL LOW (ref 850–3900)
MCH: 30.4 pg (ref 27.0–33.0)
MCHC: 33 g/dL (ref 32.0–36.0)
MCV: 92.3 fL (ref 80.0–100.0)
MPV: 8.9 fL (ref 7.5–12.5)
Monocytes Relative: 8.3 %
Neutro Abs: 6132 cells/uL (ref 1500–7800)
Neutrophils Relative %: 84 %
Platelets: 374 10*3/uL (ref 140–400)
RBC: 2.86 10*6/uL — ABNORMAL LOW (ref 3.80–5.10)
RDW: 15.3 % — ABNORMAL HIGH (ref 11.0–15.0)
Total Lymphocyte: 6.9 %
WBC: 7.3 10*3/uL (ref 3.8–10.8)

## 2020-11-15 LAB — COMPLETE METABOLIC PANEL WITH GFR
AG Ratio: 1.2 (calc) (ref 1.0–2.5)
ALT: 13 U/L (ref 6–29)
AST: 16 U/L (ref 10–35)
Albumin: 3.1 g/dL — ABNORMAL LOW (ref 3.6–5.1)
Alkaline phosphatase (APISO): 44 U/L (ref 37–153)
BUN/Creatinine Ratio: 16 (calc) (ref 6–22)
BUN: 57 mg/dL — ABNORMAL HIGH (ref 7–25)
CO2: 21 mmol/L (ref 20–32)
Calcium: 8.5 mg/dL — ABNORMAL LOW (ref 8.6–10.4)
Chloride: 100 mmol/L (ref 98–110)
Creat: 3.53 mg/dL — ABNORMAL HIGH (ref 0.60–0.93)
GFR, Est African American: 14 mL/min/{1.73_m2} — ABNORMAL LOW (ref >=60)
GFR, Est Non African American: 12 mL/min/{1.73_m2} — ABNORMAL LOW (ref >=60)
Globulin: 2.5 g/dL (calc) (ref 1.9–3.7)
Glucose, Bld: 217 mg/dL — ABNORMAL HIGH (ref 65–99)
Potassium: 4.4 mmol/L (ref 3.5–5.3)
Sodium: 132 mmol/L — ABNORMAL LOW (ref 135–146)
Total Bilirubin: 0.9 mg/dL (ref 0.2–1.2)
Total Protein: 5.6 g/dL — ABNORMAL LOW (ref 6.1–8.1)

## 2020-11-15 LAB — PROTIME-INR
INR: 1.1
Prothrombin Time: 10.9 s (ref 9.0–11.5)

## 2020-11-15 NOTE — Progress Notes (Signed)
============================================================ ============================================================  -    CBC shows Red cell count / Hgb 8.7% stable since transfusion   -  WBC  & Platelet Count - Normal & OK  ============================================================ ============================================================  -  Glucose = 217 mg% - Too high - Recommend cut ot [employer] ! ============================================================ ============================================================  -  BUN 57  / Creat 3.53  with GFR Stable with   - GFR = 12  - which is Stage 5 Chronic Kidney Failure   - getting very close for Dialysis  ============================================================ ============================================================  -  Protime / INR = 1.1 x which is Normal - will defer to Dr Debara Pickett what                                                                               to restart as a "blood thinner"   ============================================================ ============================================================

## 2020-11-26 ENCOUNTER — Other Ambulatory Visit: Payer: Self-pay | Admitting: Internal Medicine

## 2020-11-26 ENCOUNTER — Ambulatory Visit: Payer: HMO | Admitting: Physician Assistant

## 2020-12-01 ENCOUNTER — Other Ambulatory Visit: Payer: Self-pay | Admitting: Internal Medicine

## 2020-12-01 DIAGNOSIS — E039 Hypothyroidism, unspecified: Secondary | ICD-10-CM

## 2020-12-03 ENCOUNTER — Other Ambulatory Visit: Payer: Self-pay | Admitting: Internal Medicine

## 2020-12-03 DIAGNOSIS — Z0001 Encounter for general adult medical examination with abnormal findings: Secondary | ICD-10-CM

## 2020-12-03 DIAGNOSIS — E1122 Type 2 diabetes mellitus with diabetic chronic kidney disease: Secondary | ICD-10-CM

## 2020-12-03 DIAGNOSIS — R410 Disorientation, unspecified: Secondary | ICD-10-CM

## 2020-12-03 DIAGNOSIS — E1129 Type 2 diabetes mellitus with other diabetic kidney complication: Secondary | ICD-10-CM

## 2020-12-03 DIAGNOSIS — R2681 Unsteadiness on feet: Secondary | ICD-10-CM

## 2020-12-03 DIAGNOSIS — Z9181 History of falling: Secondary | ICD-10-CM

## 2020-12-03 DIAGNOSIS — N185 Chronic kidney disease, stage 5: Secondary | ICD-10-CM

## 2020-12-03 DIAGNOSIS — S7012XS Contusion of left thigh, sequela: Secondary | ICD-10-CM

## 2020-12-03 DIAGNOSIS — E1165 Type 2 diabetes mellitus with hyperglycemia: Secondary | ICD-10-CM

## 2020-12-03 DIAGNOSIS — Z79899 Other long term (current) drug therapy: Secondary | ICD-10-CM

## 2020-12-04 ENCOUNTER — Other Ambulatory Visit: Payer: Self-pay | Admitting: Adult Health Nurse Practitioner

## 2020-12-04 ENCOUNTER — Telehealth: Payer: Self-pay | Admitting: *Deleted

## 2020-12-04 ENCOUNTER — Telehealth: Payer: Self-pay | Admitting: Internal Medicine

## 2020-12-04 DIAGNOSIS — D649 Anemia, unspecified: Secondary | ICD-10-CM

## 2020-12-04 NOTE — Telephone Encounter (Signed)
Contacted patient regarding home health order placed by Dr Unk Pinto.  Patient states she does not need home health, her husband, Ashley [name],  is caring for her. Patient reports she is improving.  She reports she fell one time and spouse left work to come home and help her. She states she does not want a home health agency in her home. Patient states she feels safe, well fed, clean, well cared for, and has access to help( if needed) in her home. She has no DME needs at this time,  she will call if she changes her mind or if she needs anything in the future. She is aware and appreciates our outreach and understands the assistance available to her.

## 2020-12-04 NOTE — Telephone Encounter (Signed)
[facility] case manager called to discuss patient. Per the patient's brother, who is her POA, the patient is having frequent falls since last hospital discharge. Due to the falling, the patient remains in bed while her spouse goes to work and urinates and has bowel movements while he is away. Spouse cleans patient when he returns from work. Brother states patient does not want any assistance at home, due to the condition of the home. Case manager is requesting an order for a Copywriter, advertising. Dr Melford Aase is aware and order has been placed.

## 2020-12-12 ENCOUNTER — Telehealth: Payer: Self-pay | Admitting: *Deleted

## 2020-12-12 NOTE — Telephone Encounter (Signed)
Carter Kitten called and reported [facility] made a home visit and reported the patient looked well cared for. States [facility] will continue to follow the patient.
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
## 2020-12-20 ENCOUNTER — Ambulatory Visit: Payer: HMO | Admitting: General Practice

## 2020-12-20 ENCOUNTER — Encounter: Payer: Self-pay | Admitting: General Practice

## 2020-12-20 ENCOUNTER — Other Ambulatory Visit: Payer: Self-pay

## 2020-12-20 VITALS — BP 132/74 | HR 77 | Ht 63.0 in | Wt 154.4 lb

## 2020-12-20 DIAGNOSIS — R600 Localized edema: Secondary | ICD-10-CM

## 2020-12-20 DIAGNOSIS — I739 Peripheral vascular disease, unspecified: Secondary | ICD-10-CM

## 2020-12-20 DIAGNOSIS — E782 Mixed hyperlipidemia: Secondary | ICD-10-CM

## 2020-12-20 DIAGNOSIS — I82411 Acute embolism and thrombosis of right femoral vein: Secondary | ICD-10-CM | POA: Diagnosis not present

## 2020-12-20 DIAGNOSIS — I10 Essential (primary) hypertension: Secondary | ICD-10-CM

## 2020-12-20 NOTE — Patient Instructions (Signed)
Medication Instructions:  The current medical regimen is effective;  continue present plan and medications as directed. Please refer to the Current Medication list given to you today.  *If you need a refill on your cardiac medications before your next appointment, please call your pharmacy*  Lab Work:   Testing/Procedures:  NONE    NONE  Special Instructions MAKE SURE TO [place].  PLEASE READ AND FOLLOW SALTY 6-ATTACHED-1,800mg  daily  PLEASE INCREASE PHYSICAL ACTIVITY AS TOLERATED  Follow-Up: Your next appointment:  6 month(s) In Person with K. Mali Hilty, MD ONLY  Please call our office 2 months in advance to schedule this appointment   At [facility], you and your health needs are our priority.  As part of our continuing mission to provide you with exceptional heart care, we have created designated Provider Care Teams.  These Care Teams include your primary Cardiologist (physician) and Advanced Practice Providers (APPs -  Physician Assistants and Nurse Practitioners) who all work together to provide you with the care you need, when you need it.  We recommend signing up for the patient portal called "MyChart".  Sign up information is provided on this After Visit Summary.  MyChart is used to connect with patients for Virtual Visits (Telemedicine).  Patients are able to view lab/test results, encounter notes, upcoming appointments, etc.  Non-urgent messages can be sent to your provider as well.   To learn more about what you can do with MyChart, go to [email or web address].              6 SALTY THINGS TO AVOID     1,800MG  DAILY

## 2020-12-27 ENCOUNTER — Other Ambulatory Visit: Payer: Self-pay | Admitting: Adult Health

## 2020-12-27 ENCOUNTER — Other Ambulatory Visit: Payer: Self-pay | Admitting: Internal Medicine

## 2020-12-27 ENCOUNTER — Telehealth: Payer: Self-pay

## 2020-12-27 NOTE — Telephone Encounter (Signed)
Called and spoke w/pt regarding a warfarin refill that their pharmacy had sent in and the pt stated that they are no longer taking it so I refused the refill

## 2020-12-27 NOTE — Telephone Encounter (Signed)
Called and spoke w/pt regarding coumadin and they stated that they had discontinued it

## 2021-01-14 ENCOUNTER — Encounter: Payer: Self-pay | Admitting: Internal Medicine

## 2021-01-14 NOTE — Patient Instructions (Signed)
Warfarin Coagulopathy  Warfarin coagulopathy refers to bleeding that may occur as a complication of the medicine warfarin. Warfarin is a blood thinner (anticoagulant). Anticoagulants prevent dangerous blood clots. Bleeding is the most common and most serious complication of warfarin. While taking warfarin, you will need to have blood tests (prothrombin tests, or PT tests) regularly to measure your blood clotting time. The PT test results will be reported as the International Normalized Ratio (INR). The INR tells your health care provider whether your dosage of warfarin needs to be changed. The longer it takes your blood to clot, the higher the INR. Your risk of warfarin coagulopathy increases as your INR increases. What are the causes? This condition may be caused by:  Taking too much warfarin (overdose).  Underlying medical conditions.  Changes to your diet.  Interactions with medicines, supplements, or alcohol. What are the signs or symptoms? Warfarin coagulopathy may cause bleeding from any tissue or organ. Symptoms may include:  Bleeding from the gums.  A nosebleed that is not easily stopped.  Blood in stool. This may look like bright red, dark, or black, tarry stools.  Blood in urine. This may look like pink, red, or brown urine.  Unusual bruising or bruising easily.  A cut that does not stop bleeding within 10 minutes.  Coughing up blood.  Vomiting blood.  Feeling nauseous for longer than 1 day.  Broken blood vessels in the eye (subconjunctival hemorrhage). This may look like a bright red or dark red patch on the white part of the eye.  Abdominal or back pain with or without bruising.  Sudden, severe headache.  Sudden weakness or numbness of the face, arm, or leg, especially on one side of the body.  Sudden confusion.  Difficulty speaking (aphasia) or understanding speech.  Sudden trouble seeing out of one or both eyes.  Unexpected difficulty  walking.  Dizziness.  Loss of balance or coordination.  Unusual vaginal bleeding.  Swelling or pain at an injection site.  Skin scarring due to tissue death (necrosis) of fatty tissue. This may cause pain in the waist, thighs, or buttocks. This is more common among women. How is this diagnosed? This condition is diagnosed after your health care provider places you on warfarin and then finds out how it affects your blood's ability to clot. Prothrombin time (PT) clotting tests are used to monitor your clotting factor. These tests also help your health care provider to find the warfarin dose that is best for you. How is this treated? This condition is treated with vitamin K. Vitamin K helps the blood to clot. You may receive vitamin K every 12 hours, or as needed. You may also receive donated plasma (transfusions of fresh frozen plasma). Plasma is the liquid part of blood, and contains substances that help the blood clot. Follow these instructions at home: Medicines  Take warfarin exactly as told by your health care provider. This ensures that you avoid bleeding or clots that could result in serious injury, pain, or disability.  Take your medicine at the same time every day. If you forget to take your dose of warfarin, take it as soon as you remember on that day. If you do not remember to take it on that day, do not take an extra dose the next day.  Contact your health care provider if you miss a dose or take an extra dose. Do not change your dosage on your own to make up for missed or extra doses.  Talk with your  health care provider or your pharmacist before starting or stopping any new medicines. Many prescription and over-the-counter medicines can interfere with warfarin. This includes over-the-counter vitamins, dietary supplements, herbal medicines, and pain medicines. Your warfarin dosage may need to be adjusted. Eating and drinking  It is important to maintain a normal, balanced diet  while taking warfarin. Avoid major changes in your diet. If you are planning to change your diet, talk with your health care provider before making changes.  Your health care provider may recommend that you work with a diet and nutrition specialist (dietitian).  Vitamin K makes warfarin less effective. It is found in many foods. Eat a consistent amount of foods that contain vitamin K. For example, you may decide to eat 2 vitamin K-containing foods each day. Your warfarin dose is set according to the amount of vitamin K in your blood.  Eat a consistent amount of foods that contain vitamin K. Vitamin K makes warfarin less effective, so eating the same amount each day enables your health care provider to set the correct dose of warfarin. You may decide to eat 2 vitamin K-containing foods each day. Tests  Make sure to have PT tests at least once every 4-6 weeks for the entire time you are taking warfarin.  Ask your health care provider what your target INR range is. Make sure you always know your target range. If your INR is not in your target range, your health care provider may adjust your dosage.   Preventing bleeding and injury  Some common over-the-counter medicines and supplements may increase the risk of bleeding while taking warfarin. They include: ? Acetaminophen. ? Aspirin. ? NSAIDs, such as ibuprofen or naproxen. ? Vitamin E.  Avoid situations that cause bleeding. You may bleed more easily while taking warfarin. To limit bleeding, take the following actions: ? Use a softer toothbrush. ? Floss with waxed floss, not unwaxed floss. ? Shave with an electric razor, not with a blade. ? Limit your use of sharp objects. ? Avoid potentially harmful activities, such as contact sports. General instructions  Wear or carry identification that says that you are taking warfarin.  Make sure that all health care providers, including your dentist, know that you are taking warfarin.  If you need  surgery, tell your health care provider that you are taking warfarin. You may have to stop taking warfarin before your surgery.  If you plan to breastfeed or become pregnant while taking warfarin, talk with your health care provider.  Avoid alcohol, tobacco, and drugs. ? If your health care provider approves, limit alcohol intake to no more than 1 drink a day for non-pregnant women and 2 drinks a day for men. One drink equals 12 oz. of beer, 5 oz. of wine, or 1 oz. of hard liquor. ? If you change the amount of nicotine, tobacco, or alcohol you use, tell your health care provider.  Keep all follow-up visits and lab visits as told by your health care provider. This is very important because warfarin is a medicine that needs to be closely monitored. Contact a health care provider if:  You miss a dose.  You take an extra dose.  You plan to have any kind of surgery or procedure.  You are unable to take your medicine due to nausea, vomiting, or diarrhea.  You have any major changes in your diet, or you plan to make major changes in your diet.  You start or stop any over-the-counter medicine, prescription medicine, or dietary  supplement.  You become pregnant, plan to become pregnant, or think you may be pregnant.  You have menstrual periods that are heavier than usual, or unusual vaginal bleeding.  You have unusual bruising.  You lose your appetite.  You have a fever.  You have diarrhea that lasts for more than 24 hours. Get help right away if:  You develop symptoms of an allergic reaction, such as: ? Swelling of the lips, face, tongue, mouth, or throat. ? Rash. ? Itching. ? Itchy, red, swollen areas of skin (hives). ? Trouble breathing. ? Chest tightness.  You have any symptoms of stroke. BEFAST is an easy way to remember the main warning signs of stroke: ? B - Balance. Signs are dizziness, sudden trouble walking, or loss of balance. ? E - Eye. Signs are trouble seeing or a  sudden change in vision. ? F - Face. Signs are sudden weakness or numbness of the face, or the face or eyelid drooping on one side. ? A - Arm. Signs are weakness or numbness in an arm. This happens suddenly and usually on one side of the body. ? S - Speech. Signs are trouble speaking, slurred speech, or trouble understanding speech. ? T - Time. Time to call emergency services. Write down the time your symptoms started.  You have other signs of stroke, such as: ? A sudden, severe headache with no known cause. ? Nausea or vomiting. ? Seizure.  You have signs or symptoms of a blood clot, such as: ? Pain or swelling in your leg or arm. ? Skin that is red or warm to the touch on your arm or leg. ? Shortness of breath or difficulty breathing. ? Chest pain. ? Unexplained fever.  You have: ? A fall or have an accident, especially if you hit your head. ? Blood in your urine. Your urine may look reddish, pinkish, or tea-colored. ? Blood in your stool. Your stool may be black or bright red. ? Bleeding that does not stop after applying pressure to the area for 30 minutes. ? Severe pain in your joints or back. ? Purple or blue toes. ? Skin ulcers that do not go away.  You vomit blood or cough up blood. The blood may be bright red, or it may look like coffee grounds. These symptoms may represent a serious problem that is an emergency. Do not wait to see if the symptoms will go away. Get medical help right away. Call your local emergency services (911 in the [place] Do not drive yourself to the hospital. Summary  Warfarin needs to be closely monitored with blood tests. It is very important to keep all lab visits and follow-up visits with your health care provider. Make sure you know your target INR range and your warfarin dosage.  Monitor how much vitamin K you eat every day. Try to eat the same amount every day.  Wear or carry identification that says that you are taking warfarin.  Take  warfarin at the same time every day. Call your health care provider if you miss a dose or if you take an extra dose. Do not change the dosage of warfarin on your own.  Know the signs and symptoms of blood clots, bleeding, and stroke. Know when to get emergency medical help. This information is not intended to replace advice given to you by your health care provider. Make sure you discuss any questions you have with your health care provider. Document Revised: 02/02/2020 Document Reviewed: 02/02/2020 Elsevier Patient  Education  2021 [employer].  Due to recent changes in healthcare laws, you may see the results of your imaging and laboratory studies on MyChart before your provider has had a chance to review them.  We understand that in some cases there may be results that are confusing or concerning to you. Not all laboratory results come back in the same time frame and the provider may be waiting for multiple results in order to interpret others.  Please give [place] 48 hours in order for your provider to thoroughly review all the results before contacting the office for clarification of your results.     ++++++++++++++++++++++++++++++++++  Vit D  & Vit C 1,000 mg   are recommended to help protect  against the Covid-19 and other Corona viruses.    Also it's recommended  to take  Zinc 50 mg  to help  protect against the Covid-19   and best place to get  is also on [employer].com  and don't pay more than 6-8 cents /pill !   ===================================== Coronavirus (COVID-19) Are you at risk?  Are you at risk for the Coronavirus (COVID-19)?  To be considered HIGH RISK for Coronavirus (COVID-19), you have to meet the following criteria:  . Traveled to [place], [place], [place], [place] or [place]; or in the [place] to [place], [place], [place]  . or [place]; and have fever, cough, and shortness of breath within the last 2 weeks of travel OR . Been in close contact with a  person diagnosed with COVID-19 within the last 2 weeks and have  . fever, cough,and shortness of breath .  . IF YOU DO NOT MEET THESE CRITERIA, YOU ARE CONSIDERED LOW RISK FOR COVID-19.  What to do if you are HIGH RISK for COVID-19?  Marland Kitchen If you are having a medical emergency, call 911. . Seek medical care right away. Before you go to a doctor's office, urgent care or emergency department, .  call ahead and tell them about your recent travel, contact with someone diagnosed with COVID-19  .  and your symptoms.  . You should receive instructions from your physician's office regarding next steps of care.  . When you arrive at healthcare provider, tell the healthcare staff immediately you have returned from  . visiting [place], [place], [place], [place] or [place]; or traveled in the [place] to [place], [place],  . [place] or [place] in the last two weeks or you have been in close contact with a person diagnosed with  . COVID-19 in the last 2 weeks.   . Tell the health care staff about your symptoms: fever, cough and shortness of breath. . After you have been seen by a medical provider, you will be either: o Tested for (COVID-19) and discharged home on quarantine except to seek medical care if  o symptoms worsen, and asked to  - Stay home and avoid contact with others until you get your results (4-5 days)  - Avoid travel on public transportation if possible (such as bus, train, or airplane) or o Sent to the Emergency Department by EMS for evaluation, COVID-19 testing  and  o possible admission depending on your condition and test results.  What to do if you are LOW RISK for COVID-19?  Reduce your risk of any infection by using the same precautions used for avoiding the common cold or flu:  Marland Kitchen Wash your hands often with soap and warm water for at least 20  seconds.  If soap and water are not readily available,  . use an alcohol-based hand sanitizer with at least 60% alcohol.  . If  coughing or sneezing, cover your mouth and nose by coughing or sneezing into the elbow areas of your shirt or coat, .  into a tissue or into your sleeve (not your hands). . Avoid shaking hands with others and consider head nods or verbal greetings only. . Avoid touching your eyes, nose, or mouth with unwashed hands.  . Avoid close contact with people who are sick. . Avoid places or events with large numbers of people in one location, like concerts or sporting events. . Carefully consider travel plans you have or are making. . If you are planning any travel outside or inside the [place], visit the CDC's Travelers' Health webpage for the latest health notices. . If you have some symptoms but not all symptoms, continue to monitor at home and seek medical attention  . if your symptoms worsen. . If you are having a medical emergency, call 911.   ++++++++++++++++++++++++++++++++ Recommend Adult Low Dose Aspirin or  coated  Aspirin 81 mg daily  To reduce risk of Colon Cancer 40 %,  Skin Cancer 26 % ,  Melanoma 46%  and  Pancreatic cancer 60% ++++++++++++++++++++++++++++++++ Vitamin D goal  is between 70-100.  Please make sure that you are taking your Vitamin D as directed.  It is very important as a natural anti-inflammatory  helping hair, skin, and nails, as well as reducing stroke and heart attack risk.  It helps your bones and helps with mood. It also decreases numerous cancer risks so please take it as directed.  Low Vit D is associated with a 200-300% higher risk for CANCER  and 200-300% higher risk for HEART   ATTACK  &  STROKE.   .....................................Marland Kitchen It is also associated with higher death rate at younger ages,  autoimmune diseases like Rheumatoid arthritis, Lupus, Multiple Sclerosis.    Also many other serious conditions, like depression, Alzheimer's Dementia, infertility, muscle aches, fatigue, fibromyalgia - just to name a few. ++++++++++++++++++++ Recommend the  book "The END of DIETING" by Dr Excell Seltzer  & the book "The END of DIABETES " by Dr Excell Seltzer At [email or web address][email or web address] - get book & Audio CD's    Being diabetic has a  300% increased risk for heart attack, stroke, cancer, and alzheimer- type vascular dementia. It is very important that you work harder with diet by avoiding all foods that are white. Avoid white rice (brown & wild rice is OK), white potatoes (sweetpotatoes in moderation is OK), White bread or wheat bread or anything made out of white flour like bagels, donuts, rolls, buns, biscuits, cakes, pastries, cookies, pizza crust, and pasta (made from white flour & egg whites) - vegetarian pasta or spinach or wheat pasta is OK. Multigrain breads like Arnold's or Pepper[employer], or multigrain sandwich thins or flatbreads.  Diet, exercise and weight loss can reverse and cure diabetes in the early stages.  Diet, exercise and weight loss is very important in the control and prevention of complications of diabetes which affects every system in your body, ie. Brain - dementia/stroke, eyes - glaucoma/blindness, heart - heart attack/heart failure, kidneys - dialysis, stomach - gastric paralysis, intestines - malabsorption, nerves - severe painful neuritis, circulation - gangrene & loss of a leg(s), and finally cancer and Alzheimers.    I recommend avoid fried & greasy foods,  sweets/candy, white rice (brown or wild rice  or Quinoa is OK), white potatoes (sweet potatoes are OK) - anything made from white flour - bagels, doughnuts, rolls, buns, biscuits,white and wheat breads, pizza crust and traditional pasta made of white flour & egg white(vegetarian pasta or spinach or wheat pasta is OK).  Multi-grain bread is OK - like multi-grain flat bread or sandwich thins. Avoid alcohol in excess. Exercise is also important.    Eat all the vegetables you want - avoid meat, especially red meat and dairy - especially cheese.  Cheese is the most concentrated form of trans-fats  which is the worst thing to clog up our arteries. Veggie cheese is OK which can be found in the fresh produce section at [employer] or [employer] or [employer]  +++++++++++++++++++++ DASH Eating Plan  DASH stands for "Dietary Approaches to Stop Hypertension."   The DASH eating plan is a healthy eating plan that has been shown to reduce high blood pressure (hypertension). Additional health benefits may include reducing the risk of type 2 diabetes mellitus, heart disease, and stroke. The DASH eating plan may also help with weight loss. WHAT DO I NEED TO KNOW ABOUT THE DASH EATING PLAN? For the DASH eating plan, you will follow these general guidelines:  Choose foods with a percent daily value for sodium of less than 5% (as listed on the food label).  Use salt-free seasonings or herbs instead of table salt or sea salt.  Check with your health care provider or pharmacist before using salt substitutes.  Eat lower-sodium products, often labeled as "lower sodium" or "no salt added."  Eat fresh foods.  Eat more vegetables, fruits, and low-fat dairy products.  Choose whole grains. Look for the word "whole" as the first word in the ingredient list.  Choose fish   Limit sweets, desserts, sugars, and sugary drinks.  Choose heart-healthy fats.  Eat veggie cheese   Eat more home-cooked food and less restaurant, buffet, and fast food.  Limit fried foods.  Cook foods using methods other than frying.  Limit canned vegetables. If you do use them, rinse them well to decrease the sodium.  When eating at a restaurant, ask that your food be prepared with less salt, or no salt if possible.                      WHAT FOODS CAN I EAT? Read Dr Fara Olden Fuhrman's books on The End of Dieting & The End of Diabetes  Grains Whole grain or whole wheat bread. Brown rice. Whole grain or whole wheat pasta. Quinoa, bulgur, and whole grain cereals. Low-sodium cereals. Corn or whole wheat flour tortillas.  Whole grain cornbread. Whole grain crackers. Low-sodium crackers.  Vegetables Fresh or frozen vegetables (raw, steamed, roasted, or grilled). Low-sodium or reduced-sodium tomato and vegetable juices. Low-sodium or reduced-sodium tomato sauce and paste. Low-sodium or reduced-sodium canned vegetables.   Fruits All fresh, canned (in natural juice), or frozen fruits.  Protein Products  All fish and seafood.  Dried beans, peas, or lentils. Unsalted nuts and seeds. Unsalted canned beans.  Dairy Low-fat dairy products, such as skim or 1% milk, 2% or reduced-fat cheeses, low-fat ricotta or cottage cheese, or plain low-fat yogurt. Low-sodium or reduced-sodium cheeses.  Fats and Oils Tub margarines without trans fats. Light or reduced-fat mayonnaise and salad dressings (reduced sodium). Avocado. Safflower, olive, or canola oils. Natural peanut or almond butter.  Other Unsalted popcorn and pretzels. The items listed above may not be a complete list of recommended foods or  beverages. Contact your dietitian for more options.  +++++++++++++++  WHAT FOODS ARE NOT RECOMMENDED? Grains/ White flour or wheat flour White bread. White pasta. White rice. Refined cornbread. Bagels and croissants. Crackers that contain trans fat.  Vegetables  Creamed or fried vegetables. Vegetables in a . Regular canned vegetables. Regular canned tomato sauce and paste. Regular tomato and vegetable juices.  Fruits Dried fruits. Canned fruit in light or heavy syrup. Fruit juice.  Meat and Other Protein Products Meat in general - RED meat & White meat.  Fatty cuts of meat. Ribs, chicken wings, all processed meats as bacon, sausage, bologna, salami, fatback, hot dogs, bratwurst and packaged luncheon meats.  Dairy Whole or 2% milk, cream, half-and-half, and cream cheese. Whole-fat or sweetened yogurt. Full-fat cheeses or blue cheese. Non-dairy creamers and whipped toppings. Processed cheese, cheese spreads, or cheese  curds.  Condiments Onion and garlic salt, seasoned salt, table salt, and sea salt. Canned and packaged gravies. Worcestershire sauce. Tartar sauce. Barbecue sauce. Teriyaki sauce. Soy sauce, including reduced sodium. Steak sauce. Fish sauce. Oyster sauce. Cocktail sauce. Horseradish. Ketchup and mustard. Meat flavorings and tenderizers. Bouillon cubes. Hot sauce. Tabasco sauce. Marinades. Taco seasonings. Relishes.  Fats and Oils Butter, stick margarine, lard, shortening and bacon fat. Coconut, palm kernel, or palm oils. Regular salad dressings.  Pickles and olives. Salted popcorn and pretzels.  The items listed above may not be a complete list of foods and beverages to avoid.
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
## 2021-01-15 ENCOUNTER — Encounter: Payer: Self-pay | Admitting: Internal Medicine

## 2021-01-15 ENCOUNTER — Ambulatory Visit (INDEPENDENT_AMBULATORY_CARE_PROVIDER_SITE_OTHER): Payer: HMO | Admitting: Internal Medicine

## 2021-01-15 ENCOUNTER — Other Ambulatory Visit: Payer: Self-pay

## 2021-01-15 VITALS — BP 138/82 | HR 75 | Temp 97.2°F | Resp 17 | Ht 63.0 in | Wt 157.8 lb

## 2021-01-15 DIAGNOSIS — I10 Essential (primary) hypertension: Secondary | ICD-10-CM | POA: Diagnosis not present

## 2021-01-15 DIAGNOSIS — E039 Hypothyroidism, unspecified: Secondary | ICD-10-CM

## 2021-01-15 DIAGNOSIS — E1122 Type 2 diabetes mellitus with diabetic chronic kidney disease: Secondary | ICD-10-CM

## 2021-01-15 DIAGNOSIS — E1169 Type 2 diabetes mellitus with other specified complication: Secondary | ICD-10-CM | POA: Diagnosis not present

## 2021-01-15 DIAGNOSIS — E559 Vitamin D deficiency, unspecified: Secondary | ICD-10-CM | POA: Diagnosis not present

## 2021-01-15 DIAGNOSIS — E785 Hyperlipidemia, unspecified: Secondary | ICD-10-CM

## 2021-01-15 DIAGNOSIS — I700 Atherosclerosis of aorta: Secondary | ICD-10-CM

## 2021-01-15 DIAGNOSIS — Z794 Long term (current) use of insulin: Secondary | ICD-10-CM

## 2021-01-15 DIAGNOSIS — N185 Chronic kidney disease, stage 5: Secondary | ICD-10-CM

## 2021-01-15 DIAGNOSIS — M1000 Idiopathic gout, unspecified site: Secondary | ICD-10-CM

## 2021-01-15 DIAGNOSIS — R300 Dysuria: Secondary | ICD-10-CM

## 2021-01-15 DIAGNOSIS — K219 Gastro-esophageal reflux disease without esophagitis: Secondary | ICD-10-CM

## 2021-01-16 ENCOUNTER — Other Ambulatory Visit: Payer: Self-pay | Admitting: Internal Medicine

## 2021-01-16 DIAGNOSIS — E876 Hypokalemia: Secondary | ICD-10-CM

## 2021-01-16 DIAGNOSIS — Z79899 Other long term (current) drug therapy: Secondary | ICD-10-CM

## 2021-01-16 LAB — CBC WITH DIFFERENTIAL/PLATELET
Absolute Monocytes: 595 cells/uL (ref 200–950)
Basophils Absolute: 29 cells/uL (ref 0–200)
Basophils Relative: 0.7 %
Eosinophils Absolute: 62 cells/uL (ref 15–500)
Eosinophils Relative: 1.5 %
HCT: 28 % — ABNORMAL LOW (ref 35.0–45.0)
Hemoglobin: 8.8 g/dL — ABNORMAL LOW (ref 11.7–15.5)
Lymphs Abs: 968 cells/uL (ref 850–3900)
MCH: 28.9 pg (ref 27.0–33.0)
MCHC: 31.4 g/dL — ABNORMAL LOW (ref 32.0–36.0)
MCV: 91.8 fL (ref 80.0–100.0)
MPV: 9.2 fL (ref 7.5–12.5)
Monocytes Relative: 14.5 %
Neutro Abs: 2448 cells/uL (ref 1500–7800)
Neutrophils Relative %: 59.7 %
Platelets: 325 10*3/uL (ref 140–400)
RBC: 3.05 10*6/uL — ABNORMAL LOW (ref 3.80–5.10)
RDW: 12.8 % (ref 11.0–15.0)
Total Lymphocyte: 23.6 %
WBC: 4.1 10*3/uL (ref 3.8–10.8)

## 2021-01-16 LAB — VITAMIN D 25 HYDROXY (VIT D DEFICIENCY, FRACTURES): Vit D, 25-Hydroxy: 58 ng/mL (ref 30–100)

## 2021-01-16 LAB — LIPID PANEL
Cholesterol: 120 mg/dL (ref <200)
HDL: 51 mg/dL (ref >=50)
LDL Cholesterol (Calc): 51 mg/dL (calc)
Non-HDL Cholesterol (Calc): 69 mg/dL (calc) (ref <130)
Total CHOL/HDL Ratio: 2.4 (calc) (ref <5.0)
Triglycerides: 92 mg/dL (ref <150)

## 2021-01-16 LAB — COMPLETE METABOLIC PANEL WITH GFR
AG Ratio: 1.4 (calc) (ref 1.0–2.5)
ALT: 9 U/L (ref 6–29)
AST: 10 U/L (ref 10–35)
Albumin: 3.5 g/dL — ABNORMAL LOW (ref 3.6–5.1)
Alkaline phosphatase (APISO): 27 U/L — ABNORMAL LOW (ref 37–153)
BUN/Creatinine Ratio: 17 (calc) (ref 6–22)
BUN: 74 mg/dL — ABNORMAL HIGH (ref 7–25)
CO2: 20 mmol/L (ref 20–32)
Calcium: 9.1 mg/dL (ref 8.6–10.4)
Chloride: 99 mmol/L (ref 98–110)
Creat: 4.35 mg/dL — ABNORMAL HIGH (ref 0.60–0.93)
GFR, Est African American: 11 mL/min/{1.73_m2} — ABNORMAL LOW (ref >=60)
GFR, Est Non African American: 9 mL/min/{1.73_m2} — ABNORMAL LOW (ref >=60)
Globulin: 2.5 g/dL (calc) (ref 1.9–3.7)
Glucose, Bld: 139 mg/dL — ABNORMAL HIGH (ref 65–99)
Potassium: 3.9 mmol/L (ref 3.5–5.3)
Sodium: 134 mmol/L — ABNORMAL LOW (ref 135–146)
Total Bilirubin: 0.3 mg/dL (ref 0.2–1.2)
Total Protein: 6 g/dL — ABNORMAL LOW (ref 6.1–8.1)

## 2021-01-16 LAB — HEMOGLOBIN A1C
Hgb A1c MFr Bld: 6.3 % of total Hgb — ABNORMAL HIGH (ref <5.7)
Mean Plasma Glucose: 134 mg/dL
eAG (mmol/L): 7.4 mmol/L

## 2021-01-16 LAB — URIC ACID: Uric Acid, Serum: 7.9 mg/dL — ABNORMAL HIGH (ref 2.5–7.0)

## 2021-01-16 LAB — TSH: TSH: 1.88 mIU/L (ref 0.40–4.50)

## 2021-01-16 LAB — MAGNESIUM: Magnesium: 2.4 mg/dL (ref 1.5–2.5)

## 2021-01-16 NOTE — Progress Notes (Signed)
============================================================ ============================================================  -    Urine culture is pending - will take 2 to 3 days to get results back  ============================================================ ============================================================ - CBC shows red cell count / Hgb = 8.8 gm5 is still low , but it's stabl ============================================================ ============================================================  - Kidney function is worse - GFR down to  9    !  ============================================================ ============================================================  - Chol = 120 and LDL = 51 - Both  Excellent   - Very low risk for Heart Attack  / Stroke ========================================================  - A1c is better - down from 8.6% to now 6.3 % - Great  (  Final goal is less than 5.7%  )  ============================================================ ============================================================  - Vitamin D = 58 - Great - Electrolytes - Liver - Magnesium & Thyroid    - all  Normal / OK ============================================================ ============================================================

## 2021-01-17 LAB — URINALYSIS, ROUTINE W REFLEX MICROSCOPIC
Bilirubin Urine: NEGATIVE
Glucose, UA: NEGATIVE
Ketones, ur: NEGATIVE
Nitrite: NEGATIVE
Specific Gravity, Urine: 1.01 (ref 1.001–1.035)
Squamous Epithelial / HPF: NONE SEEN /HPF (ref <=5)
WBC, UA: >=60 /HPF — AB (ref 0–5)
pH: 6.5 (ref 5.0–8.0)

## 2021-01-17 LAB — URINE CULTURE
MICRO NUMBER:: 11951416
SPECIMEN QUALITY:: ADEQUATE

## 2021-01-17 LAB — MICROSCOPIC MESSAGE

## 2021-01-18 ENCOUNTER — Other Ambulatory Visit: Payer: Self-pay | Admitting: Internal Medicine

## 2021-01-18 DIAGNOSIS — N390 Urinary tract infection, site not specified: Secondary | ICD-10-CM

## 2021-01-18 MED ORDER — CIPROFLOXACIN HCL 250 MG PO TABS: ORAL_TABLET | ORAL | 0 refills | Status: DC

## 2021-02-19 ENCOUNTER — Ambulatory Visit: Payer: HMO

## 2021-02-19 ENCOUNTER — Other Ambulatory Visit: Payer: Self-pay | Admitting: Internal Medicine

## 2021-02-21 ENCOUNTER — Ambulatory Visit (INDEPENDENT_AMBULATORY_CARE_PROVIDER_SITE_OTHER): Payer: HMO

## 2021-02-21 ENCOUNTER — Telehealth: Payer: Self-pay | Admitting: Internal Medicine

## 2021-02-21 ENCOUNTER — Other Ambulatory Visit: Payer: Self-pay

## 2021-02-21 VITALS — BP 144/83 | HR 75 | Temp 97.3°F | Resp 17 | Ht 63.0 in | Wt 159.0 lb

## 2021-02-21 DIAGNOSIS — N390 Urinary tract infection, site not specified: Secondary | ICD-10-CM

## 2021-02-21 DIAGNOSIS — N185 Chronic kidney disease, stage 5: Secondary | ICD-10-CM | POA: Diagnosis not present

## 2021-02-21 DIAGNOSIS — Z1211 Encounter for screening for malignant neoplasm of colon: Secondary | ICD-10-CM

## 2021-02-21 NOTE — Telephone Encounter (Signed)
patient called to report she has gained 4lbs on new medicines given for fluid retention at previous office visit. States she is urinating  frequently, more difficulty getting around w/o wheelchair, continues to gain weight.  She will bring urine sample in 02/22/21 for analysis, was unable to leave sample today.

## 2021-02-21 NOTE — Progress Notes (Addendum)
Pt states she is here for a urine culture. Pt states she have concerns that she is gaining weight instead of losing weight and having frequent trips to the rest room. Pt states she put on 4 pounds.. Pt states she is on fluid pills and doesn't understand why she gained instead of losing.

## 2021-02-24 ENCOUNTER — Emergency Department (HOSPITAL_COMMUNITY): Payer: HMO

## 2021-02-24 ENCOUNTER — Inpatient Hospital Stay (HOSPITAL_COMMUNITY): Payer: HMO

## 2021-02-24 ENCOUNTER — Inpatient Hospital Stay (HOSPITAL_COMMUNITY)
Admission: EM | Admit: 2021-02-24 | Discharge: 2021-03-03 | DRG: 291 | Disposition: A | Discharge status: Hospice | Payer: HMO | Attending: Internal Medicine | Admitting: Internal Medicine

## 2021-02-24 DIAGNOSIS — Z79890 Hormone replacement therapy (postmenopausal): Secondary | ICD-10-CM

## 2021-02-24 DIAGNOSIS — N185 Chronic kidney disease, stage 5: Secondary | ICD-10-CM | POA: Diagnosis not present

## 2021-02-24 DIAGNOSIS — J438 Other emphysema: Secondary | ICD-10-CM | POA: Diagnosis not present

## 2021-02-24 DIAGNOSIS — R5381 Other malaise: Secondary | ICD-10-CM | POA: Diagnosis present

## 2021-02-24 DIAGNOSIS — Z8249 Family history of ischemic heart disease and other diseases of the circulatory system: Secondary | ICD-10-CM | POA: Diagnosis not present

## 2021-02-24 DIAGNOSIS — Z515 Encounter for palliative care: Secondary | ICD-10-CM

## 2021-02-24 DIAGNOSIS — N186 End stage renal disease: Secondary | ICD-10-CM | POA: Diagnosis present

## 2021-02-24 DIAGNOSIS — F32A Depression, unspecified: Secondary | ICD-10-CM

## 2021-02-24 DIAGNOSIS — R269 Unspecified abnormalities of gait and mobility: Secondary | ICD-10-CM | POA: Diagnosis not present

## 2021-02-24 DIAGNOSIS — Z823 Family history of stroke: Secondary | ICD-10-CM

## 2021-02-24 DIAGNOSIS — N179 Acute kidney failure, unspecified: Secondary | ICD-10-CM | POA: Diagnosis present

## 2021-02-24 DIAGNOSIS — E559 Vitamin D deficiency, unspecified: Secondary | ICD-10-CM | POA: Diagnosis present

## 2021-02-24 DIAGNOSIS — I132 Hypertensive heart and chronic kidney disease with heart failure and with stage 5 chronic kidney disease, or end stage renal disease: Principal | ICD-10-CM | POA: Diagnosis present

## 2021-02-24 DIAGNOSIS — Z9119 Patient's noncompliance with other medical treatment and regimen: Secondary | ICD-10-CM

## 2021-02-24 DIAGNOSIS — I509 Heart failure, unspecified: Secondary | ICD-10-CM | POA: Diagnosis not present

## 2021-02-24 DIAGNOSIS — I160 Hypertensive urgency: Secondary | ICD-10-CM | POA: Diagnosis present

## 2021-02-24 DIAGNOSIS — F429 Obsessive-compulsive disorder, unspecified: Secondary | ICD-10-CM | POA: Diagnosis present

## 2021-02-24 DIAGNOSIS — Z85118 Personal history of other malignant neoplasm of bronchus and lung: Secondary | ICD-10-CM

## 2021-02-24 DIAGNOSIS — I5033 Acute on chronic diastolic (congestive) heart failure: Secondary | ICD-10-CM | POA: Diagnosis present

## 2021-02-24 DIAGNOSIS — R609 Edema, unspecified: Secondary | ICD-10-CM | POA: Diagnosis not present

## 2021-02-24 DIAGNOSIS — E872 Acidosis: Secondary | ICD-10-CM | POA: Diagnosis present

## 2021-02-24 DIAGNOSIS — E7800 Pure hypercholesterolemia, unspecified: Secondary | ICD-10-CM | POA: Diagnosis present

## 2021-02-24 DIAGNOSIS — E1169 Type 2 diabetes mellitus with other specified complication: Secondary | ICD-10-CM | POA: Diagnosis present

## 2021-02-24 DIAGNOSIS — E1122 Type 2 diabetes mellitus with diabetic chronic kidney disease: Secondary | ICD-10-CM | POA: Diagnosis present

## 2021-02-24 DIAGNOSIS — Z20822 Contact with and (suspected) exposure to covid-19: Secondary | ICD-10-CM | POA: Diagnosis present

## 2021-02-24 DIAGNOSIS — I5031 Acute diastolic (congestive) heart failure: Secondary | ICD-10-CM | POA: Diagnosis not present

## 2021-02-24 DIAGNOSIS — R778 Other specified abnormalities of plasma proteins: Secondary | ICD-10-CM | POA: Diagnosis not present

## 2021-02-24 DIAGNOSIS — R0602 Shortness of breath: Secondary | ICD-10-CM | POA: Diagnosis not present

## 2021-02-24 DIAGNOSIS — K219 Gastro-esophageal reflux disease without esophagitis: Secondary | ICD-10-CM | POA: Diagnosis present

## 2021-02-24 DIAGNOSIS — R413 Other amnesia: Secondary | ICD-10-CM | POA: Diagnosis present

## 2021-02-24 DIAGNOSIS — F419 Anxiety disorder, unspecified: Secondary | ICD-10-CM | POA: Diagnosis present

## 2021-02-24 DIAGNOSIS — E039 Hypothyroidism, unspecified: Secondary | ICD-10-CM | POA: Diagnosis present

## 2021-02-24 DIAGNOSIS — D631 Anemia in chronic kidney disease: Secondary | ICD-10-CM | POA: Diagnosis present

## 2021-02-24 DIAGNOSIS — E785 Hyperlipidemia, unspecified: Secondary | ICD-10-CM | POA: Diagnosis not present

## 2021-02-24 DIAGNOSIS — I248 Other forms of acute ischemic heart disease: Secondary | ICD-10-CM | POA: Diagnosis present

## 2021-02-24 DIAGNOSIS — Z86718 Personal history of other venous thrombosis and embolism: Secondary | ICD-10-CM

## 2021-02-24 DIAGNOSIS — Z794 Long term (current) use of insulin: Secondary | ICD-10-CM

## 2021-02-24 DIAGNOSIS — F319 Bipolar disorder, unspecified: Secondary | ICD-10-CM | POA: Diagnosis present

## 2021-02-24 DIAGNOSIS — E871 Hypo-osmolality and hyponatremia: Secondary | ICD-10-CM | POA: Diagnosis present

## 2021-02-24 DIAGNOSIS — E1165 Type 2 diabetes mellitus with hyperglycemia: Secondary | ICD-10-CM | POA: Diagnosis present

## 2021-02-24 DIAGNOSIS — Z9114 Patient's other noncompliance with medication regimen: Secondary | ICD-10-CM

## 2021-02-24 DIAGNOSIS — Z66 Do not resuscitate: Secondary | ICD-10-CM | POA: Diagnosis present

## 2021-02-24 DIAGNOSIS — Z8544 Personal history of malignant neoplasm of other female genital organs: Secondary | ICD-10-CM

## 2021-02-24 DIAGNOSIS — F17210 Nicotine dependence, cigarettes, uncomplicated: Secondary | ICD-10-CM | POA: Diagnosis present

## 2021-02-24 DIAGNOSIS — M109 Gout, unspecified: Secondary | ICD-10-CM | POA: Diagnosis present

## 2021-02-24 DIAGNOSIS — E1140 Type 2 diabetes mellitus with diabetic neuropathy, unspecified: Secondary | ICD-10-CM | POA: Diagnosis present

## 2021-02-24 DIAGNOSIS — Z7189 Other specified counseling: Secondary | ICD-10-CM | POA: Diagnosis not present

## 2021-02-24 DIAGNOSIS — Z79899 Other long term (current) drug therapy: Secondary | ICD-10-CM

## 2021-02-24 DIAGNOSIS — J449 Chronic obstructive pulmonary disease, unspecified: Secondary | ICD-10-CM | POA: Diagnosis present

## 2021-02-24 DIAGNOSIS — Z7984 Long term (current) use of oral hypoglycemic drugs: Secondary | ICD-10-CM

## 2021-02-24 DIAGNOSIS — F332 Major depressive disorder, recurrent severe without psychotic features: Secondary | ICD-10-CM | POA: Diagnosis not present

## 2021-02-24 DIAGNOSIS — Z8673 Personal history of transient ischemic attack (TIA), and cerebral infarction without residual deficits: Secondary | ICD-10-CM

## 2021-02-24 DIAGNOSIS — E876 Hypokalemia: Secondary | ICD-10-CM | POA: Diagnosis present

## 2021-02-24 DIAGNOSIS — E038 Other specified hypothyroidism: Secondary | ICD-10-CM | POA: Diagnosis not present

## 2021-02-24 LAB — TSH: TSH: 1.618 u[IU]/mL (ref 0.350–4.500)

## 2021-02-24 LAB — I-STAT VENOUS BLOOD GAS, ED
Acid-base deficit: 4 mmol/L — ABNORMAL HIGH (ref 0.0–2.0)
Bicarbonate: 21.9 mmol/L (ref 20.0–28.0)
Calcium, Ion: 1.15 mmol/L (ref 1.15–1.40)
HCT: 28 % — ABNORMAL LOW (ref 36.0–46.0)
Hemoglobin: 9.5 g/dL — ABNORMAL LOW (ref 12.0–15.0)
O2 Saturation: 63 %
Potassium: 3.6 mmol/L (ref 3.5–5.1)
Sodium: 135 mmol/L (ref 135–145)
TCO2: 23 mmol/L (ref 22–32)
pCO2, Ven: 44.4 mmHg (ref 44.0–60.0)
pH, Ven: 7.301 (ref 7.250–7.430)
pO2, Ven: 36 mmHg (ref 32.0–45.0)

## 2021-02-24 LAB — GLUCOSE, CAPILLARY
Glucose-Capillary: 182 mg/dL — ABNORMAL HIGH (ref 70–99)
Glucose-Capillary: 198 mg/dL — ABNORMAL HIGH (ref 70–99)

## 2021-02-24 LAB — CBG MONITORING, ED
Glucose-Capillary: 185 mg/dL — ABNORMAL HIGH (ref 70–99)
Glucose-Capillary: 121 mg/dL — ABNORMAL HIGH (ref 70–99)

## 2021-02-24 LAB — COMPREHENSIVE METABOLIC PANEL
ALT: 12 U/L (ref 0–44)
AST: 16 U/L (ref 15–41)
Albumin: 3.3 g/dL — ABNORMAL LOW (ref 3.5–5.0)
Alkaline Phosphatase: 24 U/L — ABNORMAL LOW (ref 38–126)
Anion gap: 12 (ref 5–15)
BUN: 64 mg/dL — ABNORMAL HIGH (ref 8–23)
CO2: 21 mmol/L — ABNORMAL LOW (ref 22–32)
Calcium: 9 mg/dL (ref 8.9–10.3)
Chloride: 101 mmol/L (ref 98–111)
Creatinine, Ser: 4.15 mg/dL — ABNORMAL HIGH (ref 0.44–1.00)
GFR, Estimated: 11 mL/min — ABNORMAL LOW (ref >60)
Glucose, Bld: 207 mg/dL — ABNORMAL HIGH (ref 70–99)
Potassium: 3.7 mmol/L (ref 3.5–5.1)
Sodium: 134 mmol/L — ABNORMAL LOW (ref 135–145)
Total Bilirubin: 0.8 mg/dL (ref 0.3–1.2)
Total Protein: 6.9 g/dL (ref 6.5–8.1)

## 2021-02-24 LAB — CBC WITH DIFFERENTIAL/PLATELET
Abs Immature Granulocytes: 0.05 10*3/uL (ref 0.00–0.07)
Basophils Absolute: 0 10*3/uL (ref 0.0–0.1)
Basophils Relative: 1 %
Eosinophils Absolute: 0.1 10*3/uL (ref 0.0–0.5)
Eosinophils Relative: 1 %
HCT: 30.3 % — ABNORMAL LOW (ref 36.0–46.0)
Hemoglobin: 9.7 g/dL — ABNORMAL LOW (ref 12.0–15.0)
Immature Granulocytes: 1 %
Lymphocytes Relative: 11 %
Lymphs Abs: 0.6 10*3/uL — ABNORMAL LOW (ref 0.7–4.0)
MCH: 30.1 pg (ref 26.0–34.0)
MCHC: 32 g/dL (ref 30.0–36.0)
MCV: 94.1 fL (ref 80.0–100.0)
Monocytes Absolute: 0.5 10*3/uL (ref 0.1–1.0)
Monocytes Relative: 8 %
Neutro Abs: 4.8 10*3/uL (ref 1.7–7.7)
Neutrophils Relative %: 78 %
Platelets: 298 10*3/uL (ref 150–400)
RBC: 3.22 MIL/uL — ABNORMAL LOW (ref 3.87–5.11)
RDW: 14.1 % (ref 11.5–15.5)
WBC: 6 10*3/uL (ref 4.0–10.5)
nRBC: 0 % (ref 0.0–0.2)

## 2021-02-24 LAB — URINALYSIS, ROUTINE W REFLEX MICROSCOPIC
Bacteria, UA: NONE SEEN
Bilirubin Urine: NEGATIVE
Glucose, UA: >=500 mg/dL — AB
Hgb urine dipstick: NEGATIVE
Ketones, ur: NEGATIVE mg/dL
Nitrite: NEGATIVE
Protein, ur: 100 mg/dL — AB
Specific Gravity, Urine: 1.008 (ref 1.005–1.030)
pH: 5 (ref 5.0–8.0)

## 2021-02-24 LAB — TROPONIN I (HIGH SENSITIVITY)
Troponin I (High Sensitivity): 92 ng/L — ABNORMAL HIGH (ref <18)
Troponin I (High Sensitivity): 92 ng/L — ABNORMAL HIGH (ref <18)

## 2021-02-24 LAB — BRAIN NATRIURETIC PEPTIDE: B Natriuretic Peptide: 2713.7 pg/mL — ABNORMAL HIGH (ref 0.0–100.0)

## 2021-02-24 LAB — RESP PANEL BY RT-PCR (FLU A&B, COVID) ARPGX2
Influenza A by PCR: NEGATIVE
Influenza B by PCR: NEGATIVE
SARS Coronavirus 2 by RT PCR: NEGATIVE

## 2021-02-24 MED ORDER — ONDANSETRON HCL 4 MG/2ML IJ SOLN: 4.0000 mg | Freq: Four times a day (QID) | INTRAMUSCULAR | Status: DC | PRN

## 2021-02-24 MED ORDER — SODIUM CHLORIDE 0.9% FLUSH: 3.0000 mL | INTRAVENOUS | Status: DC | PRN

## 2021-02-24 MED ORDER — HYDRALAZINE HCL 25 MG PO TABS
100.0000 mg | ORAL_TABLET | Freq: Three times a day (TID) | ORAL | Status: DC
  Administered 2021-02-24 – 2021-03-03 (×23): 100 mg via ORAL
  Filled 2021-02-24 (×24): qty 4

## 2021-02-24 MED ORDER — ATORVASTATIN CALCIUM 40 MG PO TABS
40.0000 mg | ORAL_TABLET | Freq: Every day | ORAL | Status: DC
  Administered 2021-02-24 – 2021-03-03 (×8): 40 mg via ORAL
  Filled 2021-02-24 (×8): qty 1

## 2021-02-24 MED ORDER — LEVOTHYROXINE SODIUM 88 MCG PO TABS
88.0000 ug | ORAL_TABLET | Freq: Every day | ORAL | Status: DC
  Administered 2021-02-26 – 2021-03-03 (×6): 88 ug via ORAL
  Filled 2021-02-24 (×6): qty 1

## 2021-02-24 MED ORDER — FUROSEMIDE 10 MG/ML IJ SOLN
40.0000 mg | Freq: Two times a day (BID) | INTRAMUSCULAR | Status: DC
  Administered 2021-02-24 – 2021-02-25 (×2): 40 mg via INTRAVENOUS
  Filled 2021-02-24 (×2): qty 4

## 2021-02-24 MED ORDER — SODIUM CHLORIDE 0.9% FLUSH
3.0000 mL | Freq: Two times a day (BID) | INTRAVENOUS | Status: DC
  Administered 2021-02-24 – 2021-03-03 (×14): 3 mL via INTRAVENOUS

## 2021-02-24 MED ORDER — CLORAZEPATE DIPOTASSIUM 7.5 MG PO TABS
7.5000 mg | ORAL_TABLET | Freq: Two times a day (BID) | ORAL | Status: DC | PRN
  Administered 2021-02-25 – 2021-03-02 (×6): 7.5 mg via ORAL
  Filled 2021-02-24 (×5): qty 2
  Filled 2021-02-24: qty 1
  Filled 2021-02-24: qty 2

## 2021-02-24 MED ORDER — FERROUS SULFATE 325 (65 FE) MG PO TABS
325.0000 mg | ORAL_TABLET | Freq: Two times a day (BID) | ORAL | Status: DC
  Administered 2021-02-24 – 2021-03-03 (×14): 325 mg via ORAL
  Filled 2021-02-24 (×14): qty 1

## 2021-02-24 MED ORDER — POTASSIUM CHLORIDE CRYS ER 10 MEQ PO TBCR
10.0000 meq | EXTENDED_RELEASE_TABLET | Freq: Three times a day (TID) | ORAL | Status: DC
  Administered 2021-02-24 – 2021-02-25 (×4): 10 meq via ORAL
  Filled 2021-02-24 (×4): qty 1

## 2021-02-24 MED ORDER — SODIUM CHLORIDE 0.9 % IV SOLN: 250.0000 mL | INTRAVENOUS | Status: DC | PRN

## 2021-02-24 MED ORDER — ACETAMINOPHEN 325 MG PO TABS: 650.0000 mg | ORAL_TABLET | ORAL | Status: DC | PRN

## 2021-02-24 MED ORDER — AMLODIPINE BESYLATE 10 MG PO TABS
10.0000 mg | ORAL_TABLET | Freq: Every day | ORAL | Status: DC
  Administered 2021-02-24 – 2021-03-03 (×8): 10 mg via ORAL
  Filled 2021-02-24 (×7): qty 1
  Filled 2021-02-24: qty 2

## 2021-02-24 MED ORDER — IPRATROPIUM-ALBUTEROL 0.5-2.5 (3) MG/3ML IN SOLN
3.0000 mL | RESPIRATORY_TRACT | Status: AC
  Administered 2021-02-24 (×3): 3 mL via RESPIRATORY_TRACT
  Filled 2021-02-24: qty 3
  Filled 2021-02-24: qty 6

## 2021-02-24 MED ORDER — FAMOTIDINE 20 MG PO TABS
20.0000 mg | ORAL_TABLET | Freq: Two times a day (BID) | ORAL | Status: DC
  Administered 2021-02-24 – 2021-02-25 (×3): 20 mg via ORAL
  Filled 2021-02-24 (×3): qty 1

## 2021-02-24 MED ORDER — NITROGLYCERIN 0.4 MG SL SUBL: 0.4000 mg | SUBLINGUAL_TABLET | SUBLINGUAL | Status: DC | PRN

## 2021-02-24 MED ORDER — CLORAZEPATE DIPOTASSIUM 3.75 MG PO TABS
7.5000 mg | ORAL_TABLET | Freq: Once | ORAL | Status: AC
  Administered 2021-02-24: 7.5 mg via ORAL
  Filled 2021-02-24: qty 2
  Filled 2021-02-24: qty 1

## 2021-02-24 MED ORDER — INSULIN ASPART 100 UNIT/ML IJ SOLN
0.0000 [IU] | Freq: Three times a day (TID) | INTRAMUSCULAR | Status: DC
  Administered 2021-02-24: 3 [IU] via SUBCUTANEOUS
  Administered 2021-02-25 (×2): 2 [IU] via SUBCUTANEOUS

## 2021-02-24 MED ORDER — IPRATROPIUM-ALBUTEROL 0.5-2.5 (3) MG/3ML IN SOLN: 3.0000 mL | RESPIRATORY_TRACT | Status: DC | PRN

## 2021-02-24 MED ORDER — FUROSEMIDE 10 MG/ML IJ SOLN
80.0000 mg | Freq: Once | INTRAMUSCULAR | Status: AC
  Administered 2021-02-24: 80 mg via INTRAVENOUS
  Filled 2021-02-24: qty 8

## 2021-02-24 MED ORDER — FENOFIBRATE 160 MG PO TABS
160.0000 mg | ORAL_TABLET | Freq: Every day | ORAL | Status: DC
  Administered 2021-02-25 – 2021-03-03 (×7): 160 mg via ORAL
  Filled 2021-02-24 (×8): qty 1

## 2021-02-24 MED ORDER — HYDRALAZINE HCL 20 MG/ML IJ SOLN: 10.0000 mg | INTRAMUSCULAR | Status: DC | PRN

## 2021-02-24 MED ORDER — LABETALOL HCL 5 MG/ML IV SOLN
5.0000 mg | Freq: Once | INTRAVENOUS | Status: AC
  Administered 2021-02-24: 5 mg via INTRAVENOUS
  Filled 2021-02-24: qty 4

## 2021-02-24 MED ORDER — FLUOXETINE HCL 20 MG PO CAPS
20.0000 mg | ORAL_CAPSULE | Freq: Every day | ORAL | Status: DC
  Administered 2021-02-24 – 2021-03-03 (×8): 20 mg via ORAL
  Filled 2021-02-24 (×8): qty 1

## 2021-02-24 MED ORDER — HEPARIN SODIUM (PORCINE) 5000 UNIT/ML IJ SOLN
5000.0000 [IU] | Freq: Three times a day (TID) | INTRAMUSCULAR | Status: DC
  Administered 2021-02-24 – 2021-03-02 (×19): 5000 [IU] via SUBCUTANEOUS
  Filled 2021-02-24 (×22): qty 1

## 2021-02-24 NOTE — Progress Notes (Signed)
VASCULAR LAB    Bilateral lower extremity venous duplex has been performed.  See CV proc for preliminary results.   Derrian Rodak, RVT 02/24/2021, 10:53 AM

## 2021-02-24 NOTE — H&P (Signed)
History and Physical    Ashley [name] UMP:[id number] DOB: Mar 01, 1946 DOA: 02/24/2021  Referring MD/NP/PA: Francia Greaves, DO PCP: Unk Pinto, MD  Consultants: Cecile Hearing, MD-cardiology Owens Loffler, MD-GI [name], MD-nephrology Chucky May, MD-psychiatry Patient coming from:  home via EMS  Chief Complaint: Shortness of breath  I have personally briefly reviewed patient's old medical records in [place]   HPI: Ashley [name] is a 75 y.o. female with medical history significant of HTN, COPD, DM type II, diastolic CHF last EF 60 -40% with grade 1 diastolic dysfunction, DVT no longer on Coumadin, CKD stage 5, CVA, iron deficiency anemia, gait disturbance, anxiety/bipolar disorder, lung cancer in remission, valvular cancer in remission, and GERD presents with complaints of shortness of breath over the last 2 to 3 weeks.  At baseline patient reports that she has issues with balance and despite using a cane is prone to fall.  Over the last 2 to 3 weeks patient reports that she has been short of breath, but is not normally on oxygen at baseline.  She had been smoking up until February of this year when she decided to quit.  Patient complains of having difficulty sleeping at night feeling as though she is suffocating having to sit up to catch her breath.  Associated symptoms include chest discomfort, wheezing, and leg swelling left leg worse than the right.  She reports her primary care doctor had told her she had fluid around her heart and had being given pills to help get some of the fluid off, but reports that she has gained approximately 10 pounds over the last 2 weeks.  Patient gets tearful and reports that her husband of 53 years does not love her and that he just wants her money to gamble.  She states that her brother is her power of attorney and is overall of her healthcare decisions.  Over the last 2 days or so that she had not been taking any of her medicines as she should.  The  patient's brother over the phone adds that the patient has been depressed and basically sits at home all day and is not taking her medications as she should.  He feels that she needs to be put in some kind of nursing facility to ensure that her proper care and he would like her to be seen by nephrology if needed.  In route with EMS patient was noted to blood pressure of 210/100, heart rate 90, respiration 22, and O2 saturations reported to be 99% on room air.  ED Course: Upon admission into the emergency department patient was seen to be afebrile, respirations 19-24, blood pressures 171/88-207/110, and O2 saturations currently maintained on 2.5 L of nasal cannula.  Labs significant for WBC 6, hemoglobin 9.7, sodium 134, BUN 64, creatinine 4.15, glucose 207, BNP 2713.7, and high-sensitivity troponin 92->92.  Chest x-ray showed increasing hazy and patchy opacities in the lower lungs concerning for atelectasis and edema.  Influenza and COVID-19 screening were negative.  Patient had been given 80 mg of Lasix IV, DuoNeb breathing treatment, and labetalol 5 mg IV.  TRH was called to admit and patient was excepted as an inpatient to a telemetry bed.  Review of Systems  Constitutional:  Positive for malaise/fatigue. Negative for fever.  HENT:  Negative for congestion.   Eyes:  Negative for photophobia and pain.  Respiratory:  Positive for shortness of breath and wheezing.   Cardiovascular:  Positive for chest pain, orthopnea and leg swelling.  Gastrointestinal:  Negative for abdominal pain, blood in stool, nausea and vomiting.  Genitourinary:  Negative for dysuria.  Musculoskeletal:  Positive for myalgias.  Neurological:  Positive for weakness.  Endo/Heme/Allergies:  Negative for polydipsia. Bruises/bleeds easily.  Psychiatric/Behavioral:  Positive for depression.    Past Medical History:  Diagnosis Date   Anxiety disorder    Arthritis LOWER BACK   Asthma    Bipolar 1 disorder ([place])    Carpal tunnel  syndrome    CKD (chronic kidney disease), stage IV (HCC)    stage 3   Depression    Diabetes mellitus    type 2   DVT (deep venous thrombosis) (HCC)    Dyslipidemia    Dyspnea    with much activity   Gait disorder    GERD (gastroesophageal reflux disease)    Gout LEFT FOOT-  STABLE   H/O hiatal hernia    History of CVA (cerebrovascular accident) FOUND PER MRI 1994--  RESIDUAL MEMORY IMPAIRED   Hypercholesteremia    Memory difficulty 08/14/2016   OCD (obsessive compulsive disorder)    Stroke ([place])    2000 memory loss    SUI (stress urinary incontinence, female)    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    Past Surgical History:  Procedure Laterality Date   BLADDER SUSPENSION  1996   CATARACT EXTRACTION     COLONOSCOPY WITH PROPOFOL N/A 10/01/2017   Procedure: COLONOSCOPY WITH PROPOFOL;  Surgeon: [name], MD;  Location: [facility];  Service: Endoscopy;  Laterality: N/A;   FUDUCIAL PLACEMENT N/A 06/24/2019   Procedure: PLACEMENT OF FUDUCIAL MARKERS;  Surgeon: Garner Nash, DO;  Location: [place] [facility];  Service: Thoracic;  Laterality: N/A;   [place]   VIDEO BRONCHOSCOPY WITH ENDOBRONCHIAL NAVIGATION N/A 06/24/2019   Procedure: VIDEO BRONCHOSCOPY WITH ENDOBRONCHIAL NAVIGATION;  Surgeon: Garner Nash, DO;  Location: [facility];  Service: Thoracic;  Laterality: N/A;   VULVAR LESION REMOVAL  06/22/2012   Procedure: VULVAR LESION;  Surgeon: Selinda Orion, MD;  [facility];  Service: Gynecology;  Laterality: N/A;  WIDE EXCISION VULVAR LESION    VULVECTOMY PARTIAL  DEC 1999     reports that she has been smoking cigarettes. She has a 54.00 pack-year smoking history. She has never used smokeless tobacco. She reports that she does not drink alcohol and does not use drugs.  Allergies  Allergen Reactions   Sulfa Antibiotics Other (See Comments)    Pt states she had "extreme pain"   Sulfacetamide  Sodium Other (See Comments)    Pain    Family History  Problem Relation Age of Onset   Stroke Father    Heart failure Father    Heart disease Father    Hyperlipidemia Father    Hypertension Father    Hypertension Brother    Hyperlipidemia Mother    Stroke Mother    Colon cancer Neg Hx     Prior to Admission medications   Medication Sig Start Date End Date Taking? Authorizing Provider  acetaminophen (TYLENOL) 500 MG tablet Take 1,000 mg by mouth every 6 (six) hours as needed for mild pain or headache.   Yes [provider]  amLODipine (NORVASC) 10 MG tablet Take 1 tablet (10 mg total) by mouth daily. 07/04/19  Yes Cristal Deer, MD  atorvastatin (LIPITOR) 40 MG tablet Take 1 tablet (40 mg total) by mouth daily. 12/27/20  Yes Liane Comber, NP  bumetanide (BUMEX) 1 MG tablet Take 1 tablet (1 mg total) by mouth daily as needed. Patient taking differently: Take 1 mg by mouth daily as needed (swelling). 10/28/20 02/24/21 Yes Dahal, Marlowe Aschoff, MD  chlorthalidone (HYGROTON) 25 MG tablet Take 25 mg by mouth daily. 11/12/20  Yes [provider]  Cholecalciferol (VITAMIN D) 125 MCG (5000 UT) CAPS Take 5,000 Units by mouth in the morning and at bedtime.   Yes [provider]  clorazepate (TRANXENE-T) 7.5 MG tablet Take    1/2 to 1 tablet    1 or 2 x /day    ONLY if needed for  Nerves Patient taking differently: Take 7.5 mg by mouth 2 (two) times daily as needed for anxiety. 05/22/20  Yes Unk Pinto, MD  famotidine (PEPCID) 20 MG tablet TAKE 1 TABLET TWICE A DAY WITH MEALS FOR INDIGESTION. Patient taking differently: Take 20 mg by mouth 2 (two) times daily. 11/26/20  Yes Liane Comber, NP  fenofibrate (TRICOR) 145 MG tablet TAKE 1 TABLET DAILY FOR TRIGLYCERIDES (BLOOD FATS) Patient taking differently: Take 145 mg by mouth daily. 02/19/21  Yes Liane Comber, NP  ferrous sulfate 325 (65 FE) MG tablet TAKE 1 TABLET 2 TIMES A DAY WITH A MEAL FOR IRON DEFICIENCY Patient  taking differently: Take 325 mg by mouth 2 (two) times daily with a meal. 12/04/20  Yes Unk Pinto, MD  FLUoxetine (PROZAC) 20 MG capsule Take 20 mg by mouth daily.   Yes [provider]  glimepiride (AMARYL) 4 MG tablet Take 1/2 to 1 tablet 2 x /day with Meals for daibetes Patient taking differently: Take 2-4 mg by mouth in the morning and at bedtime. 11/14/20  Yes Unk Pinto, MD  hydrALAZINE (APRESOLINE) 100 MG tablet Take 1 tablet (100 mg total) by mouth every 8 (eight) hours. 10/14/20 02/24/21 Yes Dahal, Marlowe Aschoff, MD  levothyroxine (SYNTHROID) 88 MCG tablet Take  1 tablet  Daily  on an empty stomach with only water for 30 minutes & no Antacid meds, Calcium or Magnesium for 4 hours & avoid Biotin Patient taking differently: Take 88 mcg by mouth daily before breakfast. 12/01/20  Yes Unk Pinto, MD  NOVOLIN N 100 UNIT/ML injection INJECT 50 UNITS EVERY MORNING AND 25 UNITS EVERY EVENING Patient taking differently: Inject 25-50 Units into the skin 3 (three) times daily as needed (high blood sugar). Injects 25-50 units into the skin after checking CBG daily (CBG 160-299 mg/dL: 25 units; CBG >300 mg/dl: 50 units); gives extra 25 units in the evening if taking a big meal 05/09/20  Yes Unk Pinto, MD  nystatin cream (MYCOSTATIN) Apply 1 application topically daily as needed for dry skin. 10/17/20  Yes [provider]  ONETOUCH ULTRA test strip TEST BLOOD SUGAR 3 TIMES A DAY 01/27/20  Yes Vicie Mutters R, PA-C  polyethylene glycol (MIRALAX / GLYCOLAX) 17 g packet Take 17 g by mouth daily. Patient taking differently: Take 17 g by mouth daily as needed for mild constipation. 03/06/20  Yes Vicie Mutters R, PA-C  potassium chloride (KLOR-CON M10) 10 MEQ tablet TAKE 1 TABLET 3 X /DAY WITH MEALS FOR POTASSIUM Patient taking differently: Take 10 mEq by mouth 3 (three) times daily. 01/16/21  Yes Liane Comber, NP  vitamin C (ASCORBIC ACID) 500 MG tablet Take 500 mg by mouth daily.  Take with iron/ferrous sulfate   Yes [provider]  ciprofloxacin (CIPRO) 250 MG tablet Take  1 tablet Daily with Food  for 2 weeks for UTI Patient not taking:  No sig reported 01/18/21   Unk Pinto, MD    Physical Exam:  Constitutional: Elderly female who is sitting straight up and bed Vitals:   02/24/21 0415 02/24/21 0438 02/24/21 0448 02/24/21 0515  BP: (!) 196/108 (!) 182/86 (!) 171/88 (!) 193/101  Pulse: 86 72 76 79  Resp: 19 (!) 24 (!) 22 (!) 21  Temp:      SpO2: 96% 96% 93% 99%  Weight:      Height:       Eyes: PERRL, lids and conjunctivae normal ENMT: Mucous membranes are moist. Posterior pharynx clear of any exudate or lesions.   Neck: normal, supple, no masses, no thyromegaly.  JVD present Respiratory: Decreased aeration with no significant wheezes or rhonchi appreciated this time.  O2 saturations currently maintained on 2.5 L of nasal cannula oxygen.  Patient able to talk in fairly complete sentences. Cardiovascular: Regular rate and rhythm, least 1-2+ pitting edema of the bilateral lower extremities involving left leg more so than the right. 2+ pedal pulses. No carotid bruits.  Abdomen: no tenderness, no masses palpated. No hepatosplenomegaly. Bowel sounds positive.  Musculoskeletal: no clubbing / cyanosis. No joint deformity upper and lower extremities. Good ROM, no contractures. Normal muscle tone.  Skin: Bruising of the bilateral upper extremity Neurologic: CN 2-12 grossly intact.  Able to move all extremities Psychiatric: Normal judgment and insight. Alert and oriented x 3.  Depressed mood.     Labs on Admission: I have personally reviewed following labs and imaging studies  CBC: Recent Labs  Lab 02/24/21 0210 02/24/21 0316  WBC 6.0  --   NEUTROABS 4.8  --   HGB 9.7* 9.5*  HCT 30.3* 28.0*  MCV 94.1  --   PLT 298  --    Basic Metabolic Panel: Recent Labs  Lab 02/24/21 0210 02/24/21 0316  NA 134* 135  K 3.7 3.6  CL 101  --   CO2 21*  --    GLUCOSE 207*  --   BUN 64*  --   CREATININE 4.15*  --   CALCIUM 9.0  --    GFR: Estimated Creatinine Clearance: 11.8 mL/min (A) (by C-G formula based on SCr of 4.15 mg/dL (H)). Liver Function Tests: Recent Labs  Lab 02/24/21 0210  AST 16  ALT 12  ALKPHOS 24*  BILITOT 0.8  PROT 6.9  ALBUMIN 3.3*   No results for input(s): LIPASE, AMYLASE in the last 168 hours. No results for input(s): AMMONIA in the last 168 hours. Coagulation Profile: No results for input(s): INR, PROTIME in the last 168 hours. Cardiac Enzymes: No results for input(s): CKTOTAL, CKMB, CKMBINDEX, TROPONINI in the last 168 hours. BNP (last 3 results) No results for input(s): PROBNP in the last 8760 hours. HbA1C: No results for input(s): HGBA1C in the last 72 hours. CBG: No results for input(s): GLUCAP in the last 168 hours. Lipid Profile: No results for input(s): CHOL, HDL, LDLCALC, TRIG, CHOLHDL, LDLDIRECT in the last 72 hours. Thyroid Function Tests: No results for input(s): TSH, T4TOTAL, FREET4, T3FREE, THYROIDAB in the last 72 hours. Anemia Panel: No results for input(s): VITAMINB12, FOLATE, FERRITIN, TIBC, IRON, RETICCTPCT in the last 72 hours. Urine analysis:    Component Value Date/Time   COLORURINE YELLOW 01/15/2021 1330   APPEARANCEUR TURBID (A) 01/15/2021 1330   LABSPEC 1.010 01/15/2021 1330   PHURINE 6.5 01/15/2021 1330   GLUCOSEU NEGATIVE 01/15/2021 1330   [place] (A) 01/15/2021 1330   BILIRUBINUR NEGATIVE 11/07/2020 [place] 01/15/2021  1330   PROTEINUR 3+ (A) 01/15/2021 1330   UROBILINOGEN 0.2 10/26/2014 1610   NITRITE NEGATIVE 01/15/2021 1330   LEUKOCYTESUR 3+ (A) 01/15/2021 1330   Sepsis Labs: Recent Results (from the past 240 hour(s))  Resp Panel by RT-PCR (Flu A&B, Covid) Nasopharyngeal Swab     Status: None   Collection Time: 02/24/21  3:04 AM   Specimen: Nasopharyngeal Swab; Nasopharyngeal(NP) swabs in vial transport medium  Result Value Ref Range Status    SARS Coronavirus 2 by RT PCR NEGATIVE NEGATIVE Final    Comment: (NOTE) SARS-CoV-2 target nucleic acids are NOT DETECTED.  The SARS-CoV-2 RNA is generally detectable in upper respiratory specimens during the acute phase of infection. The lowest concentration of SARS-CoV-2 viral copies this assay can detect is 138 copies/mL. A negative result does not preclude SARS-Cov-2 infection and should not be used as the sole basis for treatment or other patient management decisions. A negative result may occur with  improper specimen collection/handling, submission of specimen other than nasopharyngeal swab, presence of viral mutation(s) within the areas targeted by this assay, and inadequate number of viral copies(<138 copies/mL). A negative result must be combined with clinical observations, patient history, and epidemiological information. The expected result is Negative.  Fact Sheet for Patients:  EntrepreneurPulse.com.au  Fact Sheet for Healthcare Providers:  IncredibleEmployment.be  This test is no t yet approved or cleared by the [place] FDA and  has been authorized for detection and/or diagnosis of SARS-CoV-2 by FDA under an Emergency Use Authorization (EUA). This EUA will remain  in effect (meaning this test can be used) for the duration of the COVID-19 declaration under Section 564(b)(1) of the Act, 21 U.S.C.section 360bbb-3(b)(1), unless the authorization is terminated  or revoked sooner.       Influenza A by PCR NEGATIVE NEGATIVE Final   Influenza B by PCR NEGATIVE NEGATIVE Final    Comment: (NOTE) The Xpert Xpress SARS-CoV-2/FLU/RSV plus assay is intended as an aid in the diagnosis of influenza from Nasopharyngeal swab specimens and should not be used as a sole basis for treatment. Nasal washings and aspirates are unacceptable for Xpert Xpress SARS-CoV-2/FLU/RSV testing.  Fact Sheet for  Patients: EntrepreneurPulse.com.au  Fact Sheet for Healthcare Providers: IncredibleEmployment.be  This test is not yet approved or cleared by the [place] FDA and has been authorized for detection and/or diagnosis of SARS-CoV-2 by FDA under an Emergency Use Authorization (EUA). This EUA will remain in effect (meaning this test can be used) for the duration of the COVID-19 declaration under Section 564(b)(1) of the Act, 21 U.S.C. section 360bbb-3(b)(1), unless the authorization is terminated or revoked.  Performed at [facility], [place] [address] [place], [place] [address]      Radiological Exams on Admission: DG Chest Portable 1 View  Result Date: 02/24/2021 CLINICAL DATA:  Shortness of breath for several weeks, increasing in severity tonight EXAM: PORTABLE CHEST 1 VIEW COMPARISON:  CT 01/26/2020, radiograph 07/02/2019 cough FINDINGS: There are coarse reticular changes and architectural distortion in the right lung base which may reflect some chronic scarring seen on comparison imaging. Few surgical clips are again noted as well. There are increasing patchy consolidative opacities are seen in the lower lungs as well as trace bilateral effusions. Mild pulmonary vascular congestion and central cuffing. No pneumothorax. The aorta is calcified. The remaining cardiomediastinal contours are unremarkable. No acute osseous or soft tissue abnormality. Telemetry leads overlie the chest. IMPRESSION: 1. Chronic scarring and architectural distortion in the right lung base.  2. Increasing hazy and patchy opacities in the lower lungs, could reflect a combination of atelectasis and edema given additional features of cuffing and pulmonary vascular congestion. 3.  Aortic Atherosclerosis (ICD10-I70.0). Electronically Signed   By: Lovena Le M.D.   On: 02/24/2021 03:09    EKG: Independently reviewed.  Sinus rhythm 83 bpm with nonspecific T wave changes in the  inferior leads  Assessment/Plan Diastolic congestive heart failure exacerbation: Acute on chronic.  Patient presents with complaints of shortness of breath over the last 2 to 3 weeks with reports of weight gain of 10 pounds.  Patient with pitting edema on physical exam and JVD.  She had been placed on 2 L nasal cannula oxygen, but does not appear to have documentation of O2 sats dropping less than 93% on room air.  Chest x-ray significant for increased hazy opacities in the lower lungs concerning for atelectasis and edema with pulmonary vascular congestion.  BNP was 2713.7.  Last echocardiogram revealed EF of 60-65% with grade 1 diastolic dysfunction in 03/4695.  Suspect patient is likely fluid overloaded, but is still able to make urine.  Patient had been given Lasix 80 mg IV. -Admit to a cardiac telemetry bed -Heart failure orders set  initiated  -Continuous pulse oximetry with nasal cannula oxygen as needed to keep O2 saturations >92% -Strict I&Os and daily weights -Elevate lower extremities -Lasix 40 mg IV Bid -Reassess in a.m. and adjust diuresis as needed.  Hypertensive urgency/emergency: Acute.  Patient presents with blood pressures elevated up to 207/110.  She had been given 5 mg of labetalol in the ED.  Home blood pressure medications include amlodipine 10 mg daily, chlorthalidone 25 mg daily, Bumex 1 mg daily as needed swelling, and hydralazine 100 mg 3 times daily. -Continue amlodipine and hydralazine -Hydralazine IV as needed for elevated systolic blood pressures greater than 180.  Elevated troponin: Patient reported complaints of chest pain.  High-sensitivity troponin 92-> 92.  EKG without significant ischemic change.  Suspect secondary to demand in setting of heart failure. -Continue to monitor  COPD, with possible exacerbation: Patient had initially been noted to have wheezing on physical exam.  At this time no wheezing appreciated. -DuoNebs as needed shortness of  breath/wheezing  Chronic kidney disease stage IV: Creatinine 4.15 with BUN 64 on admission.  Creatinine appears similar to what it had been in May of this year.  She is followed by Dr. [name] of nephrology. -Continue to monitor kidney function with diuresis -Nephrology consulted, we will follow-up for any further recommendation  Diabetes mellitus type 2: Patient has been relatively well controlled and last hemoglobin A1c was 6.3 on 01/15/2021.  Home medications include Amaryl 2-4 units twice daily and Novolin 25-50 units 3 times daily.  Unclear how frequently patient was actually giving herself insulin. -Hypoglycemic protocols -CBGs before every meal with moderate SSI -Adjust insulin regimen as needed  Anemia of chronic  disease: Hemoglobin 9.7 g/dL which appears slightly improved from when previously checked in May.  Patient denies any complaints of blood in stools. -Continue to monitor  History of DVT: Patient previously suffered a DVT of the right lower extremity back in 2021.  She had been on Coumadin until 10/2020 after presenting with fall and left leg hematoma quiring transfusion of 2 units of packed red blood cell.  After having fall with hematoma requiring blood transfusion in March of this year.  Major Depression with recurrent episode Anxiety: Patient currently appears to be depressed, and reports a lack of well  of wanting to live.  She is followed in outpatient setting by psychiatry. -Continue Prozac and clorazepate as needed -Recommend outpatient follow-up with her psychiatrist  Hypothyroidism: TSH was 1.88 on 5/31. 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  He thinks that she would benefit from being placed in a skilled nursing facility -PT/OT to eval and treat. -Transitions of care  DNR: Present on admission   DVT prophylaxis: heparin Code Status: Full Family Communication: Brother updated over the phone is her POA Disposition Plan: To be determined Consults called: Nephrology consulted Admission status: Inpatient, require more than 2 midnight stay  Norval Morton MD [facility]   If 7PM-7AM, please contact night-coverage   02/24/2021, 7:40 AM

## 2021-02-24 NOTE — ED Notes (Signed)
Attempted report x1. 

## 2021-02-24 NOTE — ED Triage Notes (Signed)
Pt bib gems c/o SOB that has been going on for a few weeks that became more severe tonight. Denies chest pain. Hx of COPD. Pt states her pcp recently told her she had fluid retention around her heart. Pt also states she has been non compliant with her bp meds. 20G RFA  BP: 210/100 HR: 90  RR:22 Spo2: 99% RA

## 2021-02-24 NOTE — ED Provider Notes (Signed)
[facility] EMERGENCY DEPARTMENT Provider Note   CSN: [id number] Arrival date & time: 02/24/21  0040     History Chief Complaint  Patient presents with   Shortness of Breath    Ashley [name] is a 75 y.o. female.  75 year old female with multi medical problems documented below who presents emerged part today secondary to shortness of breath.  Patient states that for the last week or 2 she began [place] worsening shortness breath.  She states that she has fluid on her heart and her doctor told her to take medicine for but she does not take it.  She also states that she has not had any fever nausea vomiting.  She states that she has heart failure and COPD.  She does not wear oxygen at home but is on oxygen here.  She has no chest pain.  No cough.   Shortness of Breath     Past Medical History:  Diagnosis Date   Anxiety disorder    Arthritis LOWER BACK   Asthma    Bipolar 1 disorder ([place])    Carpal tunnel syndrome    CKD (chronic kidney disease), stage IV (HCC)    stage 3   Depression    Diabetes mellitus    type 2   DVT (deep venous thrombosis) (HCC)    Dyslipidemia    Dyspnea    with much activity   Gait disorder    GERD (gastroesophageal reflux disease)    Gout LEFT FOOT-  STABLE   H/O hiatal hernia    History of CVA (cerebrovascular accident) FOUND PER MRI 1994--  RESIDUAL MEMORY IMPAIRED   Hypercholesteremia    Memory difficulty 08/14/2016   OCD (obsessive compulsive disorder)    Stroke ([place])    2000 memory loss    SUI (stress urinary incontinence, female)    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    Patient Active Problem List   Diagnosis Date Noted   Symptomatic anemia 11/07/2020   Hematoma of left thigh 11/07/2020   Acute renal failure superimposed on stage 5 chronic kidney disease, not on chronic dialysis ([place]) 10/09/2020   AKI (acute kidney injury) ([place]) 10/09/2020   Supratherapeutic INR    Constipation 08/27/2020   Generalized abdominal pain  08/27/2020   Aortic atherosclerosis ([place]) by Chest CT scan 01/2020 06/19/2020   Acute deep vein thrombosis (DVT) of femoral vein of right lower extremity ([place]) 03/29/2020   Cancer ([place]) 03/07/2020   History of lung cancer 03/07/2020   Acquired thrombophilia ([place]) 11/29/2019   Hypokalemia 06/30/2019   Right lower lobe pulmonary nodule 06/24/2019   Long term (current) use of insulin ([place]) 02/21/2019   Obesity (BMI 30.0-34.9) 01/06/2018   Diabetes mellitus type 2, uncontrolled ([place]) 12/17/2017   Peripheral arterial disease ([place]) 10/20/2017   Polyp of ascending colon    Mild nonproliferative diabetic retinopathy of both eyes without macular edema associated with type 2 diabetes mellitus ([place]) 06/09/2017   Senile purpura ([place]) 06/09/2017   Hyperlipidemia associated with type 2 diabetes mellitus ([place]) 06/08/2017   Memory difficulty 08/14/2016   Frequent falls 04/08/2016   Hypothyroidism 03/05/2016   GERD (gastroesophageal reflux disease) 03/05/2016   CKD stage 4 due to type 2 diabetes mellitus ([place]) 03/05/2016   Gout 08/29/2015   Non compliance w medication regimen 02/20/2015   Bipolar depression ([place]) 11/06/2014   Vitamin D deficiency 12/01/2013   Medication management 12/01/2013   Abnormality of gait 03/10/2013   Carpal tunnel syndrome 10/25/2012   Diabetic neuropathy (  [place]) 10/25/2012   Major depressive disorder, recurrent episode ([place]) 10/12/2009   Essential hypertension 10/12/2009   COPD 10/12/2009    Past Surgical History:  Procedure Laterality Date   BLADDER SUSPENSION  1996   CATARACT EXTRACTION     COLONOSCOPY WITH PROPOFOL N/A 10/01/2017   Procedure: COLONOSCOPY WITH PROPOFOL;  Surgeon: [name], MD;  Location: [facility];  Service: Endoscopy;  Laterality: N/A;   FUDUCIAL PLACEMENT N/A 06/24/2019   Procedure: PLACEMENT OF FUDUCIAL MARKERS;  Surgeon: Garner Nash, DO;  Location: [facility] OR;  Service: Thoracic;  Laterality: N/A;   [place]   VIDEO BRONCHOSCOPY WITH ENDOBRONCHIAL NAVIGATION N/A 06/24/2019   Procedure: VIDEO BRONCHOSCOPY WITH ENDOBRONCHIAL NAVIGATION;  Surgeon: Garner Nash, DO;  Location: [facility];  Service: Thoracic;  Laterality: N/A;   VULVAR LESION REMOVAL  06/22/2012   Procedure: VULVAR LESION;  Surgeon: Selinda Orion, MD;  Location[facility];  Service: Gynecology;  Laterality: N/A;  WIDE EXCISION VULVAR LESION    VULVECTOMY PARTIAL  DEC 1999     OB History   No obstetric history on file.     Family History  Problem Relation Age of Onset   Stroke Father    Heart failure Father    Heart disease Father    Hyperlipidemia Father    Hypertension Father    Hypertension Brother    Hyperlipidemia Mother    Stroke Mother    Colon cancer Neg Hx     Social History   Tobacco Use   Smoking status: Every Day    Packs/day: 2.00    Years: 27.00    Pack years: 54.00    Types: Cigarettes   Smokeless tobacco: Never   Tobacco comments:    Information on smoking cessation offered, pt refused information at this time  Vaping Use   Vaping Use: Never used  Substance Use Topics   Alcohol use: No    Alcohol/week: 0.0 standard drinks   Drug use: No    Home Medications Prior to Admission medications   Medication Sig Start Date End Date Taking? Authorizing Provider  acetaminophen (TYLENOL) 500 MG tablet Take 1,000 mg by mouth every 6 (six) hours as needed for mild pain or headache.    [provider]  amLODipine (NORVASC) 10 MG tablet Take 1 tablet (10 mg total) by mouth daily. 07/04/19   Cristal Deer, MD  atorvastatin (LIPITOR) 40 MG tablet Take 1 tablet (40 mg total) by mouth daily. 12/27/20   Liane Comber, NP  bumetanide (BUMEX) 1 MG tablet Take 1 tablet (1 mg total) by mouth daily as needed. Patient taking differently: Take 1 mg by mouth daily as needed (swelling). 10/28/20 01/26/21  Terrilee Croak, MD  chlorthalidone (HYGROTON) 25  MG tablet Take 25 mg by mouth daily. 11/12/20   [provider]  Cholecalciferol (VITAMIN D) 125 MCG (5000 UT) CAPS Take 5,000 Units by mouth in the morning and at bedtime.    [provider]  ciprofloxacin (CIPRO) 250 MG tablet Take  1 tablet Daily with Food  for 2 weeks for UTI Patient not taking: Reported on 02/24/2021 01/18/21   Unk Pinto, MD  clorazepate (TRANXENE-T) 7.5 MG tablet Take    1/2 to 1 tablet    1 or 2 x /day    ONLY if needed for  Nerves Patient taking differently: Take 7.5 mg by mouth 2 (two)  times daily as needed for anxiety. 05/22/20   Unk Pinto, MD  famotidine (PEPCID) 20 MG tablet TAKE 1 TABLET TWICE A DAY WITH MEALS FOR INDIGESTION. Patient taking differently: Take 20 mg by mouth 2 (two) times daily. 11/26/20   Liane Comber, NP  fenofibrate (TRICOR) 145 MG tablet TAKE 1 TABLET DAILY FOR TRIGLYCERIDES (BLOOD FATS) Patient taking differently: Take 145 mg by mouth daily. 02/19/21   Liane Comber, NP  ferrous sulfate 325 (65 FE) MG tablet TAKE 1 TABLET 2 TIMES A DAY WITH A MEAL FOR IRON DEFICIENCY Patient taking differently: Take 325 mg by mouth 2 (two) times daily with a meal. 12/04/20   Unk Pinto, MD  FLUoxetine (PROZAC) 20 MG capsule Take 20 mg by mouth daily.    [provider]  glimepiride (AMARYL) 4 MG tablet Take 1/2 to 1 tablet 2 x /day with Meals for daibetes Patient taking differently: Take 2-4 mg by mouth in the morning and at bedtime. 11/14/20   Unk Pinto, MD  hydrALAZINE (APRESOLINE) 100 MG tablet Take 1 tablet (100 mg total) by mouth every 8 (eight) hours. 10/14/20 01/12/21  Terrilee Croak, MD  levothyroxine (SYNTHROID) 88 MCG tablet Take  1 tablet  Daily  on an empty stomach with only water for 30 minutes & no Antacid meds, Calcium or Magnesium for 4 hours & avoid Biotin Patient taking differently: Take 88 mcg by mouth daily before breakfast. 12/01/20   Unk Pinto, MD  Multiple Vitamins-Minerals (MULTIVITAMIN  WITH MINERALS) tablet Take 1 tablet by mouth daily.    [provider]  NOVOLIN N 100 UNIT/ML injection INJECT 50 UNITS EVERY MORNING AND 25 UNITS EVERY EVENING Patient taking differently: Inject 25-50 Units into the skin 3 (three) times daily as needed (high blood sugar). Injects 25-50 units into the skin after checking CBG daily (CBG 160-299 mg/dL: 25 units; CBG >300 mg/dl: 50 units); gives extra 25 units in the evening if taking a big meal 05/09/20   Unk Pinto, MD  nystatin cream (MYCOSTATIN) Apply 1 application topically daily as needed for dry skin. 10/17/20   [provider]  [facility] ULTRA test strip TEST BLOOD SUGAR 3 TIMES A DAY 01/27/20   Vicie Mutters R, PA-C  polyethylene glycol (MIRALAX / GLYCOLAX) 17 g packet Take 17 g by mouth daily. Patient taking differently: Take 17 g by mouth daily as needed for mild constipation. 03/06/20   Vladimir Crofts, PA-C  potassium chloride (KLOR-CON M10) 10 MEQ tablet TAKE 1 TABLET 3 X /DAY WITH MEALS FOR POTASSIUM Patient taking differently: Take 10 mEq by mouth 3 (three) times daily. 01/16/21   Liane Comber, NP  vitamin C (ASCORBIC ACID) 500 MG tablet Take 500 mg by mouth daily. Take with iron/ferrous sulfate    [provider]    Allergies    Sulfa antibiotics and Sulfacetamide sodium  Review of Systems   Review of Systems  Respiratory:  Positive for shortness of breath.   All other systems reviewed and are negative.  Physical Exam Updated Vital Signs BP (!) 195/97   Pulse 92   Ht 5\' 3"  (1.6 m)   Wt 75.8 kg   SpO2 97%   BMI 29.58 kg/m   Physical Exam Vitals and nursing note reviewed.  Constitutional:      Appearance: She is well-developed.  HENT:     Head: Normocephalic and atraumatic.  Cardiovascular:     Rate and Rhythm: Normal rate and regular rhythm.  Pulmonary:     Effort:  Tachypnea present. No respiratory distress.     Breath sounds: No stridor. Wheezing present.  Abdominal:     General:  There is no distension.  Musculoskeletal:     Cervical back: Normal range of motion.     Right lower leg: Edema (Pitting) present.     Left lower leg: No edema.  Neurological:     General: No focal deficit present.     Mental Status: She is alert.    ED Results / Procedures / Treatments   Labs (all labs ordered are listed, but only abnormal results are displayed) Labs Reviewed  RESP PANEL BY RT-PCR (FLU A&B, COVID) ARPGX2  CBC WITH DIFFERENTIAL/PLATELET  COMPREHENSIVE METABOLIC PANEL  BRAIN NATRIURETIC PEPTIDE  I-STAT VENOUS BLOOD GAS, ED  TROPONIN I (HIGH SENSITIVITY)    EKG None  Radiology No results found.  Procedures .Critical Care  Date/Time: 02/25/2021 6:09 AM Performed by: Merrily Pew, MD Authorized by: Merrily Pew, MD   Critical care provider statement:    Critical care time (minutes):  45   Critical care was necessary to treat or prevent imminent or life-threatening deterioration of the following conditions:  Cardiac failure and respiratory failure   Critical care was time spent personally by me on the following activities:  Discussions with consultants, evaluation of patient's response to treatment, examination of patient, ordering and performing treatments and interventions, ordering and review of laboratory studies, ordering and review of radiographic studies, pulse oximetry, re-evaluation of patient's condition, obtaining history from patient or surrogate and review of old charts   Medications Ordered in ED Medications - No data to display  ED Course  I have reviewed the triage vital signs and the nursing notes.  Pertinent labs & imaging results that were available during my care of the patient were reviewed by me and considered in my medical decision making (see chart for details).    MDM Rules/Calculators/A&P                         H/o DVT, anticoagulated, doubt PE but may need checked if no improvement on other treatmetns. More likely combo of  chf/copd. Will treat for same. 2/2 wob and reported hypoxia, will likely need admitted  Final Clinical Impression(s) / ED Diagnoses Final diagnoses:  None    Rx / DC Orders ED Discharge Orders     None        Jeaneane Adamec, Corene Cornea, MD 02/25/21 [phone]

## 2021-02-24 NOTE — Consult Note (Signed)
Nephrology Consult   Requesting provider: Norval Morton, MD   Assessment/Recommendations:   CKD5, stable: Cr seems to be around 4. Underlying etiology of CKD likely secondary to DM and HTN -kidney function around baseline -overall, Ashley [name] is very close to needing to start renal replacement therapy. Attempted to discuss this with her but led to anxiety which I attempted to address. Ashley [name] has had this discussion in the past with Dr. [name] who will be covering the service starting tomorrow. Patient willing to discuss this further with Dr. [name]. -Continue to monitor daily Cr, Dose meds for GFR<15 -Monitor Daily I/Os, Daily weight  -Maintain MAP>65 for optimal renal perfusion.  -Agree with holding ACE-I, avoid further nephrotoxins including NSAIDS, Morphine.  Unless absolutely necessary, avoid CT with contrast and/or MRI with gadolinium.      Acute on chronic dCHF exacerbation -currently on lasix 65m IV BID. I don't believe this is enough for her given her eGFR, if no adequate response then recommend increasing to 838mIV BID.  Hypertension: -resume home meds  Anemia due to chronic kidney disease: -Transfuse for Hgb<7 g/dL -check iron panel  Uncontrolled Diabetes Mellitus Type 2 with Hyperglycemia -per primary service  Recommendations conveyed to primary service.    [facility] 02/24/2021 2:16 PM   _____________________________________________________________________________________   History of Present Illness: [name] is a/an [age].o. female with a past medical history of CKD 5, DM, hypertension, dCHF, COPD, gout, GERD, bipolar disorder, history of CVA, history of vulvar cancer presents with CHF exacerbation. Ashley [name] follows with Dr. LiDanie Chandlert our office.  Ashley [name] was last seen in January 2022 and has no showed since then.  There was a discussion about dialysis however patient had not made up her mind and was hesitant about starting  dialysis. Ashley [name] presents with shortness of breath for the last 2 to 3 weeks.  Has been having issues with balance as well.  Also endorsing orthopnea with chest discomfort, wheezing, swelling of her legs, and weight gain.  Ashley [name] also has not apparently been taking any medications for the last 2 days but I asked her about this and Ashley [name] reported that it is been around 2 weeks. Apparently (as per her H&P) her brother is her power of attorney requested nephrology to be consulted. Have a blood pressure 210/100 on presentation. Denies any fevers, chest pain, dizziness, n/v, dysgeusia, loss of appetite (as a matter of fact, Ashley [name] has been eating more than usual and not watching content of her food), brain fog, hiccups, itchiness.   Medications:  Current Facility-Administered Medications  Medication Dose Route Frequency Provider Last Rate Last Admin   0.9 %  sodium chloride infusion  250 mL Intravenous PRN SmFuller Plan, MD       acetaminophen (TYLENOL) tablet 650 mg  650 mg Oral Q4H PRN Smith, Rondell A, MD       amLODipine (NORVASC) tablet 10 mg  10 mg Oral Daily SmTamala JulianRondell A, MD   10 mg at 02/24/21 0919   atorvastatin (LIPITOR) tablet 40 mg  40 mg Oral Daily Smith, Rondell A, MD   40 mg at 02/24/21 0919   clorazepate (TRANXENE) tablet 7.5 mg  7.5 mg Oral BID PRN SmFuller Plan, MD       famotidine (PEPCID) tablet 20 mg  20 mg Oral BID SmTamala JulianRondell A, MD   20 mg at 02/24/21 1330   fenofibrate tablet 160 mg  160 mg Oral Daily SmNorval MortonMD  ferrous sulfate tablet 325 mg  325 mg Oral BID WC Smith, Rondell A, MD       FLUoxetine (PROZAC) capsule 20 mg  20 mg Oral Daily Tamala Julian, Rondell A, MD   20 mg at 02/24/21 1329   furosemide (LASIX) injection 40 mg  40 mg Intravenous BID Fuller Plan A, MD       heparin injection 5,000 Units  5,000 Units Subcutaneous Q8H Fuller Plan A, MD   5,000 Units at 02/24/21 1329   hydrALAZINE (APRESOLINE) injection 10 mg  10 mg Intravenous Q4H PRN Fuller Plan A, MD       hydrALAZINE (APRESOLINE) tablet 100 mg  100 mg Oral Q8H Smith, Rondell A, MD   100 mg at 02/24/21 1329   insulin aspart (novoLOG) injection 0-15 Units  0-15 Units Subcutaneous TID WC Smith, Rondell A, MD       ipratropium-albuterol (DUONEB) 0.5-2.5 (3) MG/3ML nebulizer solution 3 mL  3 mL Nebulization Q4H PRN Norval Morton, MD       [START ON 02/25/2021] levothyroxine (SYNTHROID) tablet 88 mcg  88 mcg Oral QAC breakfast Smith, Rondell A, MD       nitroGLYCERIN (NITROSTAT) SL tablet 0.4 mg  0.4 mg Sublingual Q5 min PRN Mesner, Corene Cornea, MD       ondansetron (ZOFRAN) injection 4 mg  4 mg Intravenous Q6H PRN Smith, Rondell A, MD       potassium chloride (KLOR-CON) CR tablet 10 mEq  10 mEq Oral TID Fuller Plan A, MD   10 mEq at 02/24/21 0919   sodium chloride flush (NS) 0.9 % injection 3 mL  3 mL Intravenous Q12H Smith, Rondell A, MD   3 mL at 02/24/21 0924   sodium chloride flush (NS) 0.9 % injection 3 mL  3 mL Intravenous PRN Norval Morton, MD       Current Outpatient Medications  Medication Sig Dispense Refill   acetaminophen (TYLENOL) 500 MG tablet Take 1,000 mg by mouth every 6 (six) hours as needed for mild pain or headache.     amLODipine (NORVASC) 10 MG tablet Take 1 tablet (10 mg total) by mouth daily. 30 tablet 0   atorvastatin (LIPITOR) 40 MG tablet Take 1 tablet (40 mg total) by mouth daily. 90 tablet 1   bumetanide (BUMEX) 1 MG tablet Take 1 tablet (1 mg total) by mouth daily as needed. (Patient taking differently: Take 1 mg by mouth daily as needed (swelling).) 30 tablet 2   chlorthalidone (HYGROTON) 25 MG tablet Take 25 mg by mouth daily.     Cholecalciferol (VITAMIN D) 125 MCG (5000 UT) CAPS Take 5,000 Units by mouth in the morning and at bedtime.     clorazepate (TRANXENE-T) 7.5 MG tablet Take    1/2 to 1 tablet    1 or 2 x /day    ONLY if needed for  Nerves (Patient taking differently: Take 7.5 mg by mouth 2 (two) times daily as needed for anxiety.) 30 tablet  0   famotidine (PEPCID) 20 MG tablet TAKE 1 TABLET TWICE A DAY WITH MEALS FOR INDIGESTION. (Patient taking differently: Take 20 mg by mouth 2 (two) times daily.) 180 tablet 3   fenofibrate (TRICOR) 145 MG tablet TAKE 1 TABLET DAILY FOR TRIGLYCERIDES (BLOOD FATS) (Patient taking differently: Take 145 mg by mouth daily.) 90 tablet 1   ferrous sulfate 325 (65 FE) MG tablet TAKE 1 TABLET 2 TIMES A DAY WITH A MEAL FOR IRON DEFICIENCY (Patient taking differently: Take  325 mg by mouth 2 (two) times daily with a meal.) 180 tablet 0   FLUoxetine (PROZAC) 20 MG capsule Take 20 mg by mouth daily.     glimepiride (AMARYL) 4 MG tablet Take 1/2 to 1 tablet 2 x /day with Meals for daibetes (Patient taking differently: Take 2-4 mg by mouth in the morning and at bedtime.) 180 tablet 1   hydrALAZINE (APRESOLINE) 100 MG tablet Take 1 tablet (100 mg total) by mouth every 8 (eight) hours. 90 tablet 2   levothyroxine (SYNTHROID) 88 MCG tablet Take  1 tablet  Daily  on an empty stomach with only water for 30 minutes & no Antacid meds, Calcium or Magnesium for 4 hours & avoid Biotin (Patient taking differently: Take 88 mcg by mouth daily before breakfast.) 90 tablet 0   NOVOLIN N 100 UNIT/ML injection INJECT 50 UNITS EVERY MORNING AND 25 UNITS EVERY EVENING (Patient taking differently: Inject 25-50 Units into the skin 3 (three) times daily as needed (high blood sugar). Injects 25-50 units into the skin after checking CBG daily (CBG 160-299 mg/dL: 25 units; CBG >300 mg/dl: 50 units); gives extra 25 units in the evening if taking a big meal) 20 mL 7   nystatin cream (MYCOSTATIN) Apply 1 application topically daily as needed for dry skin.     ONETOUCH ULTRA test strip TEST BLOOD SUGAR 3 TIMES A DAY 300 strip 1   polyethylene glycol (MIRALAX / GLYCOLAX) 17 g packet Take 17 g by mouth daily. (Patient taking differently: Take 17 g by mouth daily as needed for mild constipation.) 30 each 3   potassium chloride (KLOR-CON M10) 10 MEQ  tablet TAKE 1 TABLET 3 X /DAY WITH MEALS FOR POTASSIUM (Patient taking differently: Take 10 mEq by mouth 3 (three) times daily.) 270 tablet 1   vitamin C (ASCORBIC ACID) 500 MG tablet Take 500 mg by mouth daily. Take with iron/ferrous sulfate       ALLERGIES Sulfa antibiotics and Sulfacetamide sodium  MEDICAL HISTORY Past Medical History:  Diagnosis Date   Anxiety disorder    Arthritis LOWER BACK   Asthma    Bipolar 1 disorder (HCC)    Carpal tunnel syndrome    CKD (chronic kidney disease), stage IV (HCC)    stage 3   Depression    Diabetes mellitus    type 2   DVT (deep venous thrombosis) (HCC)    Dyslipidemia    Dyspnea    with much activity   Gait disorder    GERD (gastroesophageal reflux disease)    Gout LEFT FOOT-  STABLE   H/O hiatal hernia    History of CVA (cerebrovascular accident) FOUND PER MRI 1994--  RESIDUAL MEMORY IMPAIRED   Hypercholesteremia    Memory difficulty 08/14/2016   OCD (obsessive compulsive disorder)    Stroke ([place])    2000 memory loss    SUI (stress urinary incontinence, female)    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     SOCIAL HISTORY Social History   Socioeconomic History   Marital status: Married    Spouse name: Not on file   Number of children: 0   Years of education: 12   Highest education level: Not on file  Occupational History   Occupation: Retired  Tobacco Use   Smoking status: Every Day    Packs/day: 2.00    Years: 27.00    Pack years: 54.00    Types: Cigarettes   Smokeless tobacco: Never   Tobacco comments:    Information  on smoking cessation offered, pt refused information at this time  Vaping Use   Vaping Use: Never used  Substance and Sexual Activity   Alcohol use: No    Alcohol/week: 0.0 standard drinks   Drug use: No   Sexual activity: Yes    Partners: Male    Comment: Married  Other Topics Concern   Not on file  Social History Narrative   Lives at home w/ her husband   Right-handed   Caffeine: 6 diet Cokes  per day   Social Determinants of Health   Financial Resource Strain: Not on file  Food Insecurity: Not on file  Transportation Needs: Not on file  Physical Activity: Not on file  Stress: Not on file  Social Connections: Not on file  Intimate Partner Violence: Not on file     FAMILY HISTORY Family History  Problem Relation Age of Onset   Stroke Father    Heart failure Father    Heart disease Father    Hyperlipidemia Father    Hypertension Father    Hypertension Brother    Hyperlipidemia Mother    Stroke Mother    Colon cancer Neg Hx      Review of Systems: 12 systems reviewed Otherwise as per HPI, all other systems reviewed and negative  Physical Exam: Vitals:   02/24/21 1315 02/24/21 1329  BP: (!) 147/91 (!) 174/91  Pulse: 90   Resp: 14   Temp:    SpO2: 96%    No intake/output data recorded. No intake or output data in the 24 hours ending 02/24/21 1416 General: anxious HEENT: anicteric sclera, oropharynx clear without lesions CV: regular rate, normal rhythm, no murmurs, no gallops, no rubs Lungs: diminished air entry bibasilar, no overt w/r/r/c heard, speaking in full sentences, on supplemental o2 Abd: soft, non-tender, non-distended Skin: no visible lesions or rashes Musculoskeletal: 2+ pitting edema bilateral LE's Neuro: normal speech, no gross focal deficits, tremulous but no asterixis  Test Results Reviewed Lab Results  Component Value Date   NA 135 02/24/2021   K 3.6 02/24/2021   CL 101 02/24/2021   CO2 21 (L) 02/24/2021   BUN 64 (H) 02/24/2021   CREATININE 4.15 (H) 02/24/2021   CALCIUM 9.0 02/24/2021   ALBUMIN 3.3 (L) 02/24/2021     I have reviewed all relevant outside healthcare records related to the patient's kidney injury.

## 2021-02-25 DIAGNOSIS — I509 Heart failure, unspecified: Secondary | ICD-10-CM | POA: Diagnosis not present

## 2021-02-25 DIAGNOSIS — R269 Unspecified abnormalities of gait and mobility: Secondary | ICD-10-CM | POA: Diagnosis not present

## 2021-02-25 DIAGNOSIS — F332 Major depressive disorder, recurrent severe without psychotic features: Secondary | ICD-10-CM

## 2021-02-25 DIAGNOSIS — N185 Chronic kidney disease, stage 5: Secondary | ICD-10-CM | POA: Diagnosis not present

## 2021-02-25 DIAGNOSIS — R778 Other specified abnormalities of plasma proteins: Secondary | ICD-10-CM | POA: Diagnosis not present

## 2021-02-25 LAB — BASIC METABOLIC PANEL
Anion gap: 14 (ref 5–15)
BUN: 70 mg/dL — ABNORMAL HIGH (ref 8–23)
CO2: 21 mmol/L — ABNORMAL LOW (ref 22–32)
Calcium: 9.2 mg/dL (ref 8.9–10.3)
Chloride: 98 mmol/L (ref 98–111)
Creatinine, Ser: 4.47 mg/dL — ABNORMAL HIGH (ref 0.44–1.00)
GFR, Estimated: 10 mL/min — ABNORMAL LOW (ref >60)
Glucose, Bld: 132 mg/dL — ABNORMAL HIGH (ref 70–99)
Potassium: 3.6 mmol/L (ref 3.5–5.1)
Sodium: 133 mmol/L — ABNORMAL LOW (ref 135–145)

## 2021-02-25 LAB — GLUCOSE, CAPILLARY
Glucose-Capillary: 126 mg/dL — ABNORMAL HIGH (ref 70–99)
Glucose-Capillary: 117 mg/dL — ABNORMAL HIGH (ref 70–99)
Glucose-Capillary: 103 mg/dL — ABNORMAL HIGH (ref 70–99)
Glucose-Capillary: 187 mg/dL — ABNORMAL HIGH (ref 70–99)

## 2021-02-25 MED ORDER — FUROSEMIDE 10 MG/ML IJ SOLN
80.0000 mg | Freq: Three times a day (TID) | INTRAMUSCULAR | Status: DC
  Administered 2021-02-25 – 2021-03-01 (×13): 80 mg via INTRAVENOUS
  Filled 2021-02-25 (×13): qty 8

## 2021-02-25 MED ORDER — FUROSEMIDE 10 MG/ML IJ SOLN: 80.0000 mg | Freq: Two times a day (BID) | INTRAMUSCULAR | Status: DC

## 2021-02-25 MED ORDER — FAMOTIDINE 20 MG PO TABS
20.0000 mg | ORAL_TABLET | Freq: Every day | ORAL | Status: DC
  Administered 2021-02-26 – 2021-03-03 (×6): 20 mg via ORAL
  Filled 2021-02-25 (×6): qty 1

## 2021-02-25 NOTE — Evaluation (Signed)
Occupational Therapy Evaluation Patient Details Name: Ashley [name] MRN: [id number] DOB: Nov 11, 1945 Today's Date: 02/25/2021    History of Present Illness 75 y/o female preesenting on 7/10 for shortrness of breath over last 2-3 weeks. CXR showed increasing hazy and patchy opacities in the lower lungs concerning for atelectasis and edema. PMH: HTN, COPD, DM type II, diastolic CHF last EF 60 -42% with grade 1 diastolic dysfunction, DVT no longer on Coumadin, CKD stage 5, CVA, iron deficiency anemia, gait disturbance, anxiety/bipolar disorder, lung cancer in remission, valvular cancer in remission, and GERD.   Clinical Impression   PTA, pt lives with spouse who works. Pt reports ambulatory with quad cane though husband assists with mobility, LB ADLs and tub transfers as needed. Husband also assists with med mgmt. Pt presents now with deficits in endurance, standing balance, strength and cognition. Pt demonstrates improved stability with RW use at min guard though benefits from cues for safe DME use. Pt requires Setup for UB ADLs and up to Mod A for LB ADLs (increased difficulty due to B LE edema). Recommend HHOT follow-up to maximize safety/independence with ADLs/mobility at home though pt declines HH services at this time. Will continue to follow acutely to progress endurance and safety with DME use during ADLs.     Follow Up Recommendations  Home health OT;Supervision - Intermittent    Equipment Recommendations  Other (comment) (Rolling walker)    Recommendations for Other Services       Precautions / Restrictions Precautions Precautions: Fall Restrictions Weight Bearing Restrictions: No      Mobility Bed Mobility               General bed mobility comments: Pt received in recliner    Transfers Overall transfer level: Needs assistance Equipment used: Rolling walker (2 wheeled) Transfers: Sit to/from Stand Sit to Stand: Min guard         General transfer comment: min  guard for safety in power up, cues for UE placement on DME    Balance Overall balance assessment: Needs assistance;History of Falls Sitting-balance support: Feet supported Sitting balance-Leahy Scale: Fair     Standing balance support: During functional activity Standing balance-Leahy Scale: Poor Standing balance comment: Pt reliant on B UE support for standing balance.                           ADL either performed or assessed with clinical judgement   ADL Overall ADL's : Needs assistance/impaired Eating/Feeding: Independent;Sitting Eating/Feeding Details (indicate cue type and reason): able to open containers, place condiments on food Grooming: Supervision/safety;Standing;Wash/dry face Grooming Details (indicate cue type and reason): cues to use soap, sequencing, etc. decreased attention and scanning Upper Body Bathing: Set up;Sitting   Lower Body Bathing: Moderate assistance;Sit to/from stand   Upper Body Dressing : Set up;Sitting   Lower Body Dressing: Moderate assistance;Sit to/from stand   Toilet Transfer: Min guard;Ambulation;RW   Toileting- Clothing Manipulation and Hygiene: Minimal assistance;Sit to/from stand       Functional mobility during ADLs: Min guard;Rolling walker;Cueing for sequencing;Cueing for safety General ADL Comments: Limitations in endurance, standing balance due to LE swelling and decreased activity levels at home. Encouraged elevation of B LE, and medication adherence to mgmt medical complexities     Vision Baseline Vision/History: Wears glasses Wears Glasses: At all times Patient Visual Report: No change from baseline Vision Assessment?: No apparent visual deficits     Perception     Praxis  Pertinent Vitals/Pain Pain Assessment: No/denies pain     Hand Dominance Right   Extremity/Trunk Assessment Upper Extremity Assessment Upper Extremity Assessment: Generalized weakness   Lower Extremity Assessment Lower  Extremity Assessment: Defer to PT evaluation   Cervical / Trunk Assessment Cervical / Trunk Assessment: Normal   Communication Communication Communication: No difficulties   Cognition Arousal/Alertness: Awake/alert Behavior During Therapy: WFL for tasks assessed/performed Overall Cognitive Status: History of cognitive impairments - at baseline                                 General Comments: per chart, pt with memory deficits at baseline. slower processing and difficulty recalling noted during session, cues for safety and DME use needed   General Comments  O2 98% on RA, HR 70s    Exercises     Shoulder Instructions      Home Living Family/patient expects to be discharged to:: Private residence Living Arrangements: Spouse/significant other Available Help at Discharge: Available PRN/intermittently;Family Type of Home: House Home Access: Stairs to enter [employer] of Steps: 3 Entrance Stairs-Rails: None Home Layout: One level     Bathroom Shower/Tub: Teacher, early years/pre: Standard     Home Equipment: Cane - quad;Walker - standard;Bedside commode;Wheelchair - manual          Prior Functioning/Environment Level of Independence: Needs assistance  Gait / Transfers Assistance Needed: ambulates with quad cane, also with husband assistance. assist for stair mgmt and tub transfers ADL's / Homemaking Assistance Needed: pt reports husband assist as needed for LB ADLs, husband assists with IADLs, med mgmt. uses wheelchair when going out in community   Comments: Pt reportedly ambulates with quad cane in the home with some assistance from spouse as needed. Pt requires assistance to step into tub and to manage stairs to enter/exit home. Pt reports not going out into community often as spouse works.        OT Problem List: Decreased strength;Decreased activity tolerance;Impaired balance (sitting and/or standing);Decreased cognition;Decreased  safety awareness;Decreased knowledge of use of DME or AE;Increased edema      OT Treatment/Interventions: Self-care/ADL training;Therapeutic exercise;Energy conservation;DME and/or AE instruction;Therapeutic activities;Patient/family education;Balance training    OT Goals(Current goals can be found in the care plan section) Acute Rehab OT Goals Patient Stated Goal: Improve independence OT Goal Formulation: With patient Time For Goal Achievement: 03/11/21 Potential to Achieve Goals: Good  OT Frequency: Min 2X/week   Barriers to D/C:            Co-evaluation              AM-PAC OT "6 Clicks" Daily Activity     Outcome Measure Help from another person eating meals?: None Help from another person taking care of personal grooming?: A Little Help from another person toileting, which includes using toliet, bedpan, or urinal?: A Little Help from another person bathing (including washing, rinsing, drying)?: A Lot Help from another person to put on and taking off regular upper body clothing?: A Little Help from another person to put on and taking off regular lower body clothing?: A Lot 6 Click Score: 17   End of Session Equipment Utilized During Treatment: Gait belt;Rolling walker  Activity Tolerance: Patient tolerated treatment well Patient left: in chair;with call bell/phone within reach;with chair alarm set;with family/visitor present  OT Visit Diagnosis: Unsteadiness on feet (R26.81);Other abnormalities of gait and mobility (R26.89);Muscle weakness (generalized) (M62.81);Other symptoms and signs  involving cognitive function                Time: 2811-8867 OT Time Calculation (min): 23 min Charges:  OT General Charges $OT Visit: 1 Visit OT Evaluation $OT Eval Low Complexity: 1 Low OT Treatments $Self Care/Home Management : 8-22 mins  Malachy Chamber, OTR/L Acute Rehab Services Office: [phone]   Layla Maw 02/25/2021, 12:32 PM

## 2021-02-25 NOTE — Progress Notes (Signed)
PROGRESS NOTE    Ashley [name]  YTK:[id number] DOB: 05-08-1946 DOA: 02/24/2021 PCP: Unk Pinto, MD     Brief Narrative:  Ashley [name] is a 75 y.o. WF PMHx  HTN, COPD, DM type II, diastolic CHF last EF 60 -55% with grade 1 diastolic dysfunction, DVT no longer on Coumadin, CKD stage 5, CVA, iron deficiency anemia, gait disturbance, anxiety/bipolar disorder, lung cancer in remission, valvular cancer in remission, and GERD presents with complaints of shortness of breath over the last 2 to 3 weeks.  At baseline patient reports that she has issues with balance and despite using a cane is prone to fall.  Over the last 2 to 3 weeks patient reports that she has been short of breath, but is not normally on oxygen at baseline.  She had been smoking up until February of this year when she decided to quit.  Patient complains of having difficulty sleeping at night feeling as though she is suffocating having to sit up to catch her breath.  Associated symptoms include chest discomfort, wheezing, and leg swelling left leg worse than the right.  She reports her primary care doctor had told her she had fluid around her heart and had being given pills to help get some of the fluid off, but reports that she has gained approximately 10 pounds over the last 2 weeks.  Patient gets tearful and reports that her husband of 81 years does not love her and that he just wants her money to gamble.  She states that her brother is her power of attorney and is overall of her healthcare decisions.  Over the last 2 days or so that she had not been taking any of her medicines as she should.   The patient's brother over the phone adds that the patient has been depressed and basically sits at home all day and is not taking her medications as she should.  He feels that she needs to be put in some kind of nursing facility to ensure that her proper care and he would like her to be seen by nephrology if needed.   In route with EMS patient  was noted to blood pressure of 210/100, heart rate 90, respiration 22, and O2 saturations reported to be 99% on room air.   ED Course: Upon admission into the emergency department patient was seen to be afebrile, respirations 19-24, blood pressures 171/88-207/110, and O2 saturations currently maintained on 2.5 L of nasal cannula.  Labs significant for WBC 6, hemoglobin 9.7, sodium 134, BUN 64, creatinine 4.15, glucose 207, BNP 2713.7, and high-sensitivity troponin 92->92.  Chest x-ray showed increasing hazy and patchy opacities in the lower lungs concerning for atelectasis and edema.  Influenza and COVID-19 screening were negative.  Patient had been given 80 mg of Lasix IV, DuoNeb breathing treatment, and labetalol 5 mg IV.  TRH was called to admit and patient was excepted as an inpatient to a telemetry bed.   Subjective: A/O x4, but some confusion.  Felt that I was going to beat her up because I was asking questions.  Several minutes later did not remember that I had introduced myself as her physician.   Assessment & Plan: Covid vaccination; vaccinated 2/3   Principal Problem:   Acute CHF (congestive heart failure) ([place]) Active Problems:   Major depressive disorder, recurrent episode ([place])   COPD   Abnormality of gait   Hypothyroidism   Memory difficulty   Hyperlipidemia associated with type 2 diabetes mellitus ([place])  Hypertensive urgency   Elevated troponin   CKD (chronic kidney disease) stage 5, GFR less than 15 ml/min ([facility])   DNR (do not resuscitate)  Acute on Chronic Diastolic CHF -Patient with pitting edema on physical exam and JVD. -Chest x-ray significant for increased hazy opacities in the lower lungs concerning for atelectasis and edema with pulmonary vascular congestion.   -BNP was 2713.7. -09/2020 echocardiogram: EF of 60-65% with grade 1 diastolic dysfunction -Strict in and out +278ml - Daily weight -Lasix IV 80 mg TID  Elevated troponin/demand ischemia - Most  consistent with demand ischemia given patient's noncompliance with her medications and elevated BP. -Will continue to monitor   COPD - Stable shortness of breath secondary to anasarca    CKD stage IV (baseline Cr 4.15) -followed by Dr. [name] of nephrology -Nephrology consulted Lab Results  Component Value Date   CREATININE 4.47 (H) 02/25/2021   CREATININE 4.15 (H) 02/24/2021   CREATININE 4.35 (H) 01/15/2021   CREATININE 3.53 (H) 11/14/2020   CREATININE 3.56 (H) 11/09/2020    DM type II controlled - 5/31 hemoglobin A1c=6.3   Anemia of chronic  disease: Hemoglobin 9.7 g/dL which appears slightly improved from when previously checked in May.  Patient denies any complaints of blood in stools. -Continue to monitor   History of DVT:  -Patient previously suffered a DVT of the right lower extremity back in 2021.   -Coumadin until 10/2020 after presenting with fall and left leg hematoma quiring transfusion of 2 units of packed red blood cell.    Major Depression with recurrent episode Anxiety:  -Patient currently appears to be depressed, and reports a lack of well of wanting to live.   -She is followed in outpatient setting by psychiatry. -Continue Prozac and clorazepate as needed -Recommend outpatient follow-up with her psychiatrist   Hypothyroidism:  -TSH was 1.88 on 5/31. -Check TSH -Continue levothyroxine   HLD -Continue atorvastatin and fenofibrate   GERD -Continue Pepcid  History of CVA memory deficit   Debility secondary to gait disturbance: Has resolved patient's previous stroke she ambulates with use of a walker at baseline but is at high risk oflls and her brother reports that she has issues with her memory as well.  He thinks that she would benefit from being placed in a skilled nursing facility -PT/OT to eval and treat.     Goals of care - 7/11 palliative care consult: Admitted basically because not taking any of her medications, who has multisystem organ  failure.  Evaluate for palliative care at home vs hospice      DVT prophylaxis: Subcutaneous heparin  code Status: DNR Family Communication:  Status is: Inpatient    Dispo: The patient is from:               Anticipated d/c is to: SNF              Anticipated d/c date is: > 3 days              Patient currently is not medically stable to d/c.      Consultants:  Nephrology  Procedures/Significant Events:    I have personally reviewed and interpreted all radiology studies and my findings are as above.  VENTILATOR SETTINGS:    Cultures   Antimicrobials:    Devices    LINES / TUBES:      Continuous Infusions:  sodium chloride       Objective: Vitals:   02/25/21 0024 02/25/21 0313 02/25/21 0400 02/25/21  0800  BP: (!) 171/83  (!) 174/80 (!) 159/70  Pulse: 91  87 84  Resp: 20     Temp: 98.4 F (36.9 C)  98.2 F (36.8 C)   TempSrc: Oral  Oral   SpO2: 100%  99% 100%  Weight:  72.8 kg    Height:        Intake/Output Summary (Last 24 hours) at 02/25/2021 0951 Last data filed at 02/25/2021 0539 Gross per 24 hour  Intake 360 ml  Output 200 ml  Net 160 ml   Filed Weights   02/24/21 0045 02/24/21 1454 02/25/21 0313  Weight: 75.8 kg 73.1 kg 72.8 kg    Examination:  General: A/O x4, No acute respiratory distress Eyes: negative scleral hemorrhage, negative anisocoria, negative icterus ENT: Negative Runny nose, negative gingival bleeding, Neck:  Negative scars, masses, torticollis, lymphadenopathy, JVD Lungs: decreased breath sounds bibasilar without wheezes or crackles Cardiovascular: Regular rate and rhythm without murmur gallop or rub normal S1 and S2 Abdomen: negative abdominal pain, nondistended, positive soft, bowel sounds, no rebound, positive ascites, no appreciable mass Extremities: positive anasarca Skin: Negative rashes, lesions, ulcers Psychiatric:  Negative depression, negative anxiety, negative fatigue, negative mania  Central  nervous system:  Cranial nerves II through XII intact, tongue/uvula midline, all extremities muscle strength 5/5, sensation intact throughout, negative dysarthria, negative expressive aphasia, negative receptive aphasia.  .     Data Reviewed: Care during the described time interval was provided by me .  I have reviewed this patient's available data, including medical history, events of note, physical examination, and all test results as part of my evaluation.  CBC: Recent Labs  Lab 02/24/21 0210 02/24/21 0316  WBC 6.0  --   NEUTROABS 4.8  --   HGB 9.7* 9.5*  HCT 30.3* 28.0*  MCV 94.1  --   PLT 298  --    Basic Metabolic Panel: Recent Labs  Lab 02/24/21 0210 02/24/21 0316 02/25/21 0501  NA 134* 135 133*  K 3.7 3.6 3.6  CL 101  --  98  CO2 21*  --  21*  GLUCOSE 207*  --  132*  BUN 64*  --  70*  CREATININE 4.15*  --  4.47*  CALCIUM 9.0  --  9.2   GFR: Estimated Creatinine Clearance: 10.7 mL/min (A) (by C-G formula based on SCr of 4.47 mg/dL (H)). Liver Function Tests: Recent Labs  Lab 02/24/21 0210  AST 16  ALT 12  ALKPHOS 24*  BILITOT 0.8  PROT 6.9  ALBUMIN 3.3*   No results for input(s): LIPASE, AMYLASE in the last 168 hours. No results for input(s): AMMONIA in the last 168 hours. Coagulation Profile: No results for input(s): INR, PROTIME in the last 168 hours. Cardiac Enzymes: No results for input(s): CKTOTAL, CKMB, CKMBINDEX, TROPONINI in the last 168 hours. BNP (last 3 results) No results for input(s): PROBNP in the last 8760 hours. HbA1C: No results for input(s): HGBA1C in the last 72 hours. CBG: Recent Labs  Lab 02/24/21 0745 02/24/21 1211 02/24/21 1641 02/24/21 2012 02/25/21 0549  GLUCAP 185* 121* 182* 198* 126*   Lipid Profile: No results for input(s): CHOL, HDL, LDLCALC, TRIG, CHOLHDL, LDLDIRECT in the last 72 hours. Thyroid Function Tests: Recent Labs    02/24/21 1610  TSH 1.618   Anemia Panel: No results for input(s): VITAMINB12,  FOLATE, FERRITIN, TIBC, IRON, RETICCTPCT in the last 72 hours. Sepsis Labs: No results for input(s): PROCALCITON, LATICACIDVEN in the last 168 hours.  Recent Results (from  the past 240 hour(s))  Resp Panel by RT-PCR (Flu A&B, Covid) Nasopharyngeal Swab     Status: None   Collection Time: 02/24/21  3:04 AM   Specimen: Nasopharyngeal Swab; Nasopharyngeal(NP) swabs in vial transport medium  Result Value Ref Range Status   SARS Coronavirus 2 by RT PCR NEGATIVE NEGATIVE Final    Comment: (NOTE) SARS-CoV-2 target nucleic acids are NOT DETECTED.  The SARS-CoV-2 RNA is generally detectable in upper respiratory specimens during the acute phase of infection. The lowest concentration of SARS-CoV-2 viral copies this assay can detect is 138 copies/mL. A negative result does not preclude SARS-Cov-2 infection and should not be used as the sole basis for treatment or other patient management decisions. A negative result may occur with  improper specimen collection/handling, submission of specimen other than nasopharyngeal swab, presence of viral mutation(s) within the areas targeted by this assay, and inadequate number of viral copies(<138 copies/mL). A negative result must be combined with clinical observations, patient history, and epidemiological information. The expected result is Negative.  Fact Sheet for Patients:  EntrepreneurPulse.com.au  Fact Sheet for Healthcare Providers:  IncredibleEmployment.be  This test is no t yet approved or cleared by the [place] FDA and  has been authorized for detection and/or diagnosis of SARS-CoV-2 by FDA under an Emergency Use Authorization (EUA). This EUA will remain  in effect (meaning this test can be used) for the duration of the COVID-19 declaration under Section 564(b)(1) of the Act, 21 U.S.C.section 360bbb-3(b)(1), unless the authorization is terminated  or revoked sooner.       Influenza A by PCR  NEGATIVE NEGATIVE Final   Influenza B by PCR NEGATIVE NEGATIVE Final    Comment: (NOTE) The Xpert Xpress SARS-CoV-2/FLU/RSV plus assay is intended as an aid in the diagnosis of influenza from Nasopharyngeal swab specimens and should not be used as a sole basis for treatment. Nasal washings and aspirates are unacceptable for Xpert Xpress SARS-CoV-2/FLU/RSV testing.  Fact Sheet for Patients: EntrepreneurPulse.com.au  Fact Sheet for Healthcare Providers: IncredibleEmployment.be  This test is not yet approved or cleared by the [place] FDA and has been authorized for detection and/or diagnosis of SARS-CoV-2 by FDA under an Emergency Use Authorization (EUA). This EUA will remain in effect (meaning this test can be used) for the duration of the COVID-19 declaration under Section 564(b)(1) of the Act, 21 U.S.C. section 360bbb-3(b)(1), unless the authorization is terminated or revoked.  Performed at [facility], [place] [address], [place], [place] [address]          Radiology Studies: DG Chest Portable 1 View  Result Date: 02/24/2021 CLINICAL DATA:  Shortness of breath for several weeks, increasing in severity tonight EXAM: PORTABLE CHEST 1 VIEW COMPARISON:  CT 01/26/2020, radiograph 07/02/2019 cough FINDINGS: There are coarse reticular changes and architectural distortion in the right lung base which may reflect some chronic scarring seen on comparison imaging. Few surgical clips are again noted as well. There are increasing patchy consolidative opacities are seen in the lower lungs as well as trace bilateral effusions. Mild pulmonary vascular congestion and central cuffing. No pneumothorax. The aorta is calcified. The remaining cardiomediastinal contours are unremarkable. No acute osseous or soft tissue abnormality. Telemetry leads overlie the chest. IMPRESSION: 1. Chronic scarring and architectural distortion in the right lung base. 2.  Increasing hazy and patchy opacities in the lower lungs, could reflect a combination of atelectasis and edema given additional features of cuffing and pulmonary vascular congestion. 3.  Aortic Atherosclerosis (ICD10-I70.0).  Electronically Signed   By: Lovena Le M.D.   On: 02/24/2021 03:09   VAS [place] LOWER EXTREMITY VENOUS (DVT)  Result Date: 02/24/2021  Lower Venous DVT Study Patient Name:  KYNDAHL JABLON Akard  Date of Exam:   02/24/2021 Medical Rec #: [id number]     Accession #:    [id number] Date of Birth: 07/30/1946     Patient Gender: F Patient Age:   79Y Exam Location:  [facility] Procedure:      VAS [place] LOWER EXTREMITY VENOUS (DVT) Referring Phys: [id number] [place] --------------------------------------------------------------------------------  Indications: Edema, and SOB.  Risk Factors: Non compliant with medication. Comparison Study: Prior negative venous duplex done 11/07/20 and 10/27/20 Performing Technologist: Sharion Dove RVS  Examination Guidelines: A complete evaluation includes B-mode imaging, spectral Doppler, color Doppler, and power Doppler as needed of all accessible portions of each vessel. Bilateral testing is considered an integral part of a complete examination. Limited examinations for reoccurring indications may be performed as noted. The reflux portion of the exam is performed with the patient in reverse Trendelenburg.  +---------+---------------+---------+-----------+----------+--------------+ RIGHT    CompressibilityPhasicitySpontaneityPropertiesThrombus Aging +---------+---------------+---------+-----------+----------+--------------+ CFV      Full           Yes      Yes                                 +---------+---------------+---------+-----------+----------+--------------+ SFJ      Full                                                        +---------+---------------+---------+-----------+----------+--------------+ FV Prox  Full                                                         +---------+---------------+---------+-----------+----------+--------------+ FV Mid   Full                                                        +---------+---------------+---------+-----------+----------+--------------+ FV DistalFull                                                        +---------+---------------+---------+-----------+----------+--------------+ PFV      Full                                                        +---------+---------------+---------+-----------+----------+--------------+ POP      Full           Yes      Yes                                 +---------+---------------+---------+-----------+----------+--------------+  PTV      Full                                                        +---------+---------------+---------+-----------+----------+--------------+ PERO     Full                                                        +---------+---------------+---------+-----------+----------+--------------+   +---------+---------------+---------+-----------+----------+--------------+ LEFT     CompressibilityPhasicitySpontaneityPropertiesThrombus Aging +---------+---------------+---------+-----------+----------+--------------+ CFV      Full           Yes      Yes                                 +---------+---------------+---------+-----------+----------+--------------+ SFJ      Full                                                        +---------+---------------+---------+-----------+----------+--------------+ FV Prox  Full                                                        +---------+---------------+---------+-----------+----------+--------------+ FV Mid   Full                                                        +---------+---------------+---------+-----------+----------+--------------+ FV DistalFull                                                         +---------+---------------+---------+-----------+----------+--------------+ PFV      Full                                                        +---------+---------------+---------+-----------+----------+--------------+ POP      Full           Yes      Yes                                 +---------+---------------+---------+-----------+----------+--------------+ PTV      Full                                                        +---------+---------------+---------+-----------+----------+--------------+  PERO     Full                                                        +---------+---------------+---------+-----------+----------+--------------+     Summary: RIGHT: interstitial edema noted throughout  LEFT: Interstitial edema noted throughout.  *See table(s) above for measurements and observations.    Preliminary         Scheduled Meds:  amLODipine  10 mg Oral Daily   atorvastatin  40 mg Oral Daily   famotidine  20 mg Oral BID   fenofibrate  160 mg Oral Daily   ferrous sulfate  325 mg Oral BID WC   FLUoxetine  20 mg Oral Daily   furosemide  40 mg Intravenous BID   heparin  5,000 Units Subcutaneous Q8H   hydrALAZINE  100 mg Oral Q8H   insulin aspart  0-15 Units Subcutaneous TID WC   levothyroxine  88 mcg Oral QAC breakfast   potassium chloride  10 mEq Oral TID   sodium chloride flush  3 mL Intravenous Q12H   Continuous Infusions:  sodium chloride       LOS: 1 day    Time spent:40 min    Trudy Kory, Geraldo Docker, MD [facility]   If 7PM-7AM, please contact night-coverage 02/25/2021, 9:51 AM

## 2021-02-25 NOTE — Progress Notes (Signed)
Heart Failure Navigator Progress Note  Assessed for Heart & Vascular TOC clinic readiness.  Unfortunately at this time the patient does not meet criteria due to advanced CKD.   Navigator available for reassessment of patient.   Kerby Nora, PharmD, BCPS Heart Failure Stewardship Pharmacist Phone [phone]

## 2021-02-25 NOTE — Evaluation (Signed)
Physical Therapy Evaluation Patient Details Name: Ashley [name] MRN: [id number] DOB: [birth year] Today's Date: 02/25/2021   History of Present Illness  75 y/o female preesenting on 7/10 for shortrness of breath over last 2-3 weeks. CXR showed increasing hazy and patchy opacities in the lower lungs concerning for atelectasis and edema. PMH: HTN, COPD, DM type II, diastolic CHF last EF 60 -62% with grade 1 diastolic dysfunction, DVT no longer on Coumadin, CKD stage 5, CVA, iron deficiency anemia, gait disturbance, anxiety/bipolar disorder, lung cancer in remission, valvular cancer in remission, and GERD.  Clinical Impression  Pt presents with generalized weakness and deficits in balance, activity tolerance, safety, and gait. Pt endorses multiple falls PTA, due to instability and gait deficits and reports receiving assistance from spouse for functional mobility. Pt performs transfers, with improved stability with use of RW compared to quad cane. Pt ambulates short household distances without assistance and with RW, limited by fatigue and requiring seated rest break. Pt with multiple cues for hand placement and device management with transfers and during gait. Pt will benefit from acute PT services to improve balance and activity tolerance and reduce falls risk. SPT recommends HHPT to address balance impairments, although pt may refuse. If pt refuses HHPT, supervision for all OOB mobility.     Follow Up Recommendations Home health PT;Supervision for mobility/OOB (Pt may refuse)    Equipment Recommendations  Rolling walker with 5" wheels    Recommendations for Other Services       Precautions / Restrictions Precautions Precautions: Fall Restrictions Weight Bearing Restrictions: No      Mobility  Bed Mobility               General bed mobility comments: Pt received sitting at EOB.    Transfers Overall transfer level: Needs assistance Equipment used: Rolling walker (2 wheeled);Quad  cane;1 person hand held assist Transfers: Sit to/from Stand Sit to Stand: Min guard;Min assist         General transfer comment: Pt initally stands with min G and quad cane assist and quickly returns to sitting due to fatigue. Pt stands again with 1 person HHA and quad cane. Additional trial sit>stand transfer performed with RW and improved stability. Cues for hand placement and device management to successfully perform transfer.  Ambulation/Gait Ambulation/Gait assistance: Min guard Gait Distance (Feet): 40 Feet (40 ft, 1 seated rest break, + 30 ft.) Assistive device: Rolling walker (2 wheeled) Gait Pattern/deviations: Step-through pattern;Decreased stride length;Trunk flexed Gait velocity: reduced Gait velocity interpretation: 1.31 - 2.62 ft/sec, indicative of limited community ambulator General Gait Details: Pt ambulates with slow step-through gait with improved stability with use of RW. Pt given cues for upright posture and proximity to RW.  Stairs            Wheelchair Mobility    Modified Rankin (Stroke Patients Only)       Balance Overall balance assessment: Needs assistance;History of Falls Sitting-balance support: Feet supported Sitting balance-Leahy Scale: Fair     Standing balance support: During functional activity Standing balance-Leahy Scale: Poor Standing balance comment: Pt reliant on B UE support for standing balance.                             Pertinent Vitals/Pain Pain Assessment: No/denies pain    Home Living Family/patient expects to be discharged to:: Private residence Living Arrangements: Spouse/significant other Available Help at Discharge: Available PRN/intermittently;Family Type of Home: House Home Access: Stairs  to enter Entrance Stairs-Rails: None Entrance Stairs-Number of Steps: 3 Home Layout: One level Home Equipment: Cane - quad;Walker - standard;Bedside commode      Prior Function Level of Independence: Needs  assistance         Comments: Pt reportedly ambulates with quad cane in the home with some assistance from spouse as needed. Pt requires assistance to step into tub and to manage stairs to enter/exit home. Pt reports not going out into community often as spouse works.     Hand Dominance        Extremity/Trunk Assessment   Upper Extremity Assessment Upper Extremity Assessment: Defer to OT evaluation    Lower Extremity Assessment Lower Extremity Assessment: Generalized weakness    Cervical / Trunk Assessment Cervical / Trunk Assessment: Normal  Communication   Communication: No difficulties  Cognition Arousal/Alertness: Awake/alert Behavior During Therapy: WFL for tasks assessed/performed Overall Cognitive Status: History of cognitive impairments - at baseline                                 General Comments: per chart, pt with memory deficits at baseline. Pt oriented x3, believes today to be the 12th. Later recalls it is Monday. Pt asking what mobility she will be performing this session when initally told at start of session.      General Comments General comments (skin integrity, edema, etc.): VSS on RA. Pt reports "hundreds" of falls and deficits with balance and gait.    Exercises     Assessment/Plan    PT Assessment Patient needs continued PT services  PT Problem List Decreased strength;Decreased activity tolerance;Decreased balance;Decreased mobility;Decreased cognition;Decreased knowledge of use of DME;Decreased safety awareness;Decreased knowledge of precautions       PT Treatment Interventions DME instruction;Gait training;Stair training;Functional mobility training;Therapeutic activities;Therapeutic exercise;Balance training;Patient/family education    PT Goals (Current goals can be found in the Care Plan section)  Acute Rehab PT Goals Patient Stated Goal: Improve independence PT Goal Formulation: With patient Time For Goal Achievement:  03/11/21 Potential to Achieve Goals: Good    Frequency Min 3X/week   Barriers to discharge        Co-evaluation               AM-PAC PT "6 Clicks" Mobility  Outcome Measure Help needed turning from your back to your side while in a flat bed without using bedrails?: None Help needed moving from lying on your back to sitting on the side of a flat bed without using bedrails?: A Little Help needed moving to and from a bed to a chair (including a wheelchair)?: A Little Help needed standing up from a chair using your arms (e.g., wheelchair or bedside chair)?: A Little Help needed to walk in hospital room?: A Little Help needed climbing 3-5 steps with a railing? : A Little 6 Click Score: 19    End of Session Equipment Utilized During Treatment: Gait belt Activity Tolerance: Patient limited by fatigue Patient left: in chair;with call bell/phone within reach;with chair alarm set;with nursing/sitter in room Nurse Communication: Mobility status PT Visit Diagnosis: Unsteadiness on feet (R26.81);Other abnormalities of gait and mobility (R26.89);Repeated falls (R29.6);Muscle weakness (generalized) (M62.81);History of falling (Z91.81);Difficulty in walking, not elsewhere classified (R26.2)    Time: 9381-8299 PT Time Calculation (min) (ACUTE ONLY): 46 min   Charges:   PT Evaluation $PT Eval Low Complexity: 1 Low          Acute  Rehab  Pager: [phone]   Garwin Brothers, SPT  02/25/2021, 10:53 AM

## 2021-02-25 NOTE — Progress Notes (Signed)
[facility] Progress Note   Subjective:   UOP 249mL overnight with furosemide 40 IV.  Remains on RA/2L [place] with 100% sat.  BP 159/70 this AM. C/o LE edema and orthpopnea  Objective Vitals:   02/25/21 0024 02/25/21 0313 02/25/21 0400 02/25/21 0800  BP: (!) 171/83  (!) 174/80 (!) 159/70  Pulse: 91  87 84  Resp: 20     Temp: 98.4 F (36.9 C)  98.2 F (36.8 C)   TempSrc: Oral  Oral   SpO2: 100%  99% 100%  Weight:  72.8 kg    Height:       Physical Exam General: chronically ill and anxious - not far from baseline Heart:RRR, no rub Lungs: clear to bases Abdomen:soft Extremities:2+ LE edema, L > R   Additional Objective Labs: Basic Metabolic Panel: Recent Labs  Lab 02/24/21 0210 02/24/21 0316 02/25/21 0501  NA 134* 135 133*  K 3.7 3.6 3.6  CL 101  --  98  CO2 21*  --  21*  GLUCOSE 207*  --  132*  BUN 64*  --  70*  CREATININE 4.15*  --  4.47*  CALCIUM 9.0  --  9.2   Liver Function Tests: Recent Labs  Lab 02/24/21 0210  AST 16  ALT 12  ALKPHOS 24*  BILITOT 0.8  PROT 6.9  ALBUMIN 3.3*   No results for input(s): LIPASE, AMYLASE in the last 168 hours. CBC: Recent Labs  Lab 02/24/21 0210 02/24/21 0316  WBC 6.0  --   NEUTROABS 4.8  --   HGB 9.7* 9.5*  HCT 30.3* 28.0*  MCV 94.1  --   PLT 298  --    Blood Culture    Component Value Date/Time   SDES  11/07/2020 1255    Urine Performed at [facility], [place] [address], [place], [place] [address]    SPECREQUEST  11/07/2020 1255    NONE Performed at [facility], [place] [address], [place], [place] [address]    CULT >=100,000 COLONIES/mL KLEBSIELLA PNEUMONIAE (A) 11/07/2020 1255   REPTSTATUS 11/10/2020 FINAL 11/07/2020 1255    Cardiac Enzymes: No results for input(s): CKTOTAL, CKMB, CKMBINDEX, TROPONINI in the last 168 hours. CBG: Recent Labs  Lab 02/24/21 0745 02/24/21 1211 02/24/21 1641 02/24/21 2012 02/25/21 0549  GLUCAP 185* 121* 182* 198*  126*   Iron Studies: No results for input(s): IRON, TIBC, TRANSFERRIN, FERRITIN in the last 72 hours. @lablastinr3 @ Studies/Results: DG Chest Portable 1 View  Result Date: 02/24/2021 CLINICAL DATA:  Shortness of breath for several weeks, increasing in severity tonight EXAM: PORTABLE CHEST 1 VIEW COMPARISON:  CT 01/26/2020, radiograph 07/02/2019 cough FINDINGS: There are coarse reticular changes and architectural distortion in the right lung base which may reflect some chronic scarring seen on comparison imaging. Few surgical clips are again noted as well. There are increasing patchy consolidative opacities are seen in the lower lungs as well as trace bilateral effusions. Mild pulmonary vascular congestion and central cuffing. No pneumothorax. The aorta is calcified. The remaining cardiomediastinal contours are unremarkable. No acute osseous or soft tissue abnormality. Telemetry leads overlie the chest. IMPRESSION: 1. Chronic scarring and architectural distortion in the right lung base. 2. Increasing hazy and patchy opacities in the lower lungs, could reflect a combination of atelectasis and edema given additional features of cuffing and pulmonary vascular congestion. 3.  Aortic Atherosclerosis (ICD10-I70.0). Electronically Signed   By: Lovena Le M.D.   On: 02/24/2021 03:09   VAS [place] LOWER  EXTREMITY VENOUS (DVT)  Result Date: 02/24/2021  Lower Venous DVT Study Patient Name:  Ashley [name] Schiller  Date of Exam:   02/24/2021 Medical Rec #: [id number]     Accession #:    [id number] Date of Birth: 05-26-1946     Patient Gender: F Patient Age:   83Y Exam Location:  [facility] Procedure:      VAS [place] LOWER EXTREMITY VENOUS (DVT) Referring Phys: [id number] Filer --------------------------------------------------------------------------------  Indications: Edema, and SOB.  Risk Factors: Non compliant with medication. Comparison Study: Prior negative venous duplex done 11/07/20 and 10/27/20 Performing  Technologist: Sharion Dove RVS  Examination Guidelines: A complete evaluation includes B-mode imaging, spectral Doppler, color Doppler, and power Doppler as needed of all accessible portions of each vessel. Bilateral testing is considered an integral part of a complete examination. Limited examinations for reoccurring indications may be performed as noted. The reflux portion of the exam is performed with the patient in reverse Trendelenburg.  +---------+---------------+---------+-----------+----------+--------------+ RIGHT    CompressibilityPhasicitySpontaneityPropertiesThrombus Aging +---------+---------------+---------+-----------+----------+--------------+ CFV      Full           Yes      Yes                                 +---------+---------------+---------+-----------+----------+--------------+ SFJ      Full                                                        +---------+---------------+---------+-----------+----------+--------------+ FV Prox  Full                                                        +---------+---------------+---------+-----------+----------+--------------+ FV Mid   Full                                                        +---------+---------------+---------+-----------+----------+--------------+ FV DistalFull                                                        +---------+---------------+---------+-----------+----------+--------------+ PFV      Full                                                        +---------+---------------+---------+-----------+----------+--------------+ POP      Full           Yes      Yes                                 +---------+---------------+---------+-----------+----------+--------------+ PTV  Full                                                        +---------+---------------+---------+-----------+----------+--------------+ PERO     Full                                                         +---------+---------------+---------+-----------+----------+--------------+   +---------+---------------+---------+-----------+----------+--------------+ LEFT     CompressibilityPhasicitySpontaneityPropertiesThrombus Aging +---------+---------------+---------+-----------+----------+--------------+ CFV      Full           Yes      Yes                                 +---------+---------------+---------+-----------+----------+--------------+ SFJ      Full                                                        +---------+---------------+---------+-----------+----------+--------------+ FV Prox  Full                                                        +---------+---------------+---------+-----------+----------+--------------+ FV Mid   Full                                                        +---------+---------------+---------+-----------+----------+--------------+ FV DistalFull                                                        +---------+---------------+---------+-----------+----------+--------------+ PFV      Full                                                        +---------+---------------+---------+-----------+----------+--------------+ POP      Full           Yes      Yes                                 +---------+---------------+---------+-----------+----------+--------------+ PTV      Full                                                        +---------+---------------+---------+-----------+----------+--------------+  PERO     Full                                                        +---------+---------------+---------+-----------+----------+--------------+     Summary: RIGHT: interstitial edema noted throughout  LEFT: Interstitial edema noted throughout.  *See table(s) above for measurements and observations.    Preliminary    Medications:  sodium chloride      amLODipine  10 mg Oral Daily    atorvastatin  40 mg Oral Daily   famotidine  20 mg Oral BID   fenofibrate  160 mg Oral Daily   ferrous sulfate  325 mg Oral BID WC   FLUoxetine  20 mg Oral Daily   furosemide  40 mg Intravenous BID   heparin  5,000 Units Subcutaneous Q8H   hydrALAZINE  100 mg Oral Q8H   insulin aspart  0-15 Units Subcutaneous TID WC   levothyroxine  88 mcg Oral QAC breakfast   potassium chloride  10 mEq Oral TID   sodium chloride flush  3 mL Intravenous Q12H    Assessment/Recommendations:    CKD5, stable: Cr seems to be around 4. Underlying etiology of CKD likely secondary to DM and HTN -kidney function around baseline and no uremic symptoms -overall, she is very close to needing to start renal replacement therapy but no indications at this moment.   -Will continue to manage medically and readdress daily course re: dialysis.  She feels conflicted re: dialysis but is clear she's not ready to do hospice/comfort care should she reach ESRD -Continue to monitor daily Cr, Dose meds for GFR<15 -Monitor Daily I/Os, Daily weight -Maintain MAP>65 for optimal renal perfusion. -Agree with holding ACE-I, avoid further nephrotoxins including NSAIDS, Morphine.  Unless absolutely necessary, avoid CT with contrast and/or MRI with gadolinium.        Acute on chronic dCHF exacerbation: presented with symptoms of orthopnea and dyspnea, wt gain.  -increased from lasix 40 BID to 80 IV BID and follow.   Hypertension: -diuresis and home meds   Anemia due to chronic kidney disease: -Transfuse for Hgb<7 g/dL -checking iron panel   Diabetes Mellitus Type 2  -per primary service; last A1c 01/15/21 was 6.3  Jannifer Hick MD 02/25/2021, 9:55 AM  [facility] Pager: [phone]

## 2021-02-26 ENCOUNTER — Other Ambulatory Visit: Payer: Self-pay | Admitting: Internal Medicine

## 2021-02-26 DIAGNOSIS — R269 Unspecified abnormalities of gait and mobility: Secondary | ICD-10-CM | POA: Diagnosis not present

## 2021-02-26 DIAGNOSIS — Z515 Encounter for palliative care: Secondary | ICD-10-CM

## 2021-02-26 DIAGNOSIS — N185 Chronic kidney disease, stage 5: Secondary | ICD-10-CM | POA: Diagnosis not present

## 2021-02-26 DIAGNOSIS — I509 Heart failure, unspecified: Secondary | ICD-10-CM | POA: Diagnosis not present

## 2021-02-26 DIAGNOSIS — Z66 Do not resuscitate: Secondary | ICD-10-CM

## 2021-02-26 DIAGNOSIS — E039 Hypothyroidism, unspecified: Secondary | ICD-10-CM

## 2021-02-26 DIAGNOSIS — Z7189 Other specified counseling: Secondary | ICD-10-CM

## 2021-02-26 DIAGNOSIS — R778 Other specified abnormalities of plasma proteins: Secondary | ICD-10-CM | POA: Diagnosis not present

## 2021-02-26 LAB — GLUCOSE, CAPILLARY
Glucose-Capillary: 109 mg/dL — ABNORMAL HIGH (ref 70–99)
Glucose-Capillary: 237 mg/dL — ABNORMAL HIGH (ref 70–99)
Glucose-Capillary: 107 mg/dL — ABNORMAL HIGH (ref 70–99)
Glucose-Capillary: 184 mg/dL — ABNORMAL HIGH (ref 70–99)

## 2021-02-26 LAB — COMPREHENSIVE METABOLIC PANEL
ALT: 16 U/L (ref 0–44)
AST: 15 U/L (ref 15–41)
Albumin: 3.1 g/dL — ABNORMAL LOW (ref 3.5–5.0)
Alkaline Phosphatase: 23 U/L — ABNORMAL LOW (ref 38–126)
Anion gap: 15 (ref 5–15)
BUN: 76 mg/dL — ABNORMAL HIGH (ref 8–23)
CO2: 22 mmol/L (ref 22–32)
Calcium: 9.6 mg/dL (ref 8.9–10.3)
Chloride: 99 mmol/L (ref 98–111)
Creatinine, Ser: 4.69 mg/dL — ABNORMAL HIGH (ref 0.44–1.00)
GFR, Estimated: 9 mL/min — ABNORMAL LOW (ref >60)
Glucose, Bld: 141 mg/dL — ABNORMAL HIGH (ref 70–99)
Potassium: 3.5 mmol/L (ref 3.5–5.1)
Sodium: 136 mmol/L (ref 135–145)
Total Bilirubin: 0.7 mg/dL (ref 0.3–1.2)
Total Protein: 6.5 g/dL (ref 6.5–8.1)

## 2021-02-26 LAB — CBC WITH DIFFERENTIAL/PLATELET
Abs Immature Granulocytes: 0.02 10*3/uL (ref 0.00–0.07)
Basophils Absolute: 0 10*3/uL (ref 0.0–0.1)
Basophils Relative: 1 %
Eosinophils Absolute: 0.1 10*3/uL (ref 0.0–0.5)
Eosinophils Relative: 2 %
HCT: 26.4 % — ABNORMAL LOW (ref 36.0–46.0)
Hemoglobin: 8.5 g/dL — ABNORMAL LOW (ref 12.0–15.0)
Immature Granulocytes: 0 %
Lymphocytes Relative: 16 %
Lymphs Abs: 0.8 10*3/uL (ref 0.7–4.0)
MCH: 29.4 pg (ref 26.0–34.0)
MCHC: 32.2 g/dL (ref 30.0–36.0)
MCV: 91.3 fL (ref 80.0–100.0)
Monocytes Absolute: 0.4 10*3/uL (ref 0.1–1.0)
Monocytes Relative: 9 %
Neutro Abs: 3.4 10*3/uL (ref 1.7–7.7)
Neutrophils Relative %: 72 %
Platelets: 268 10*3/uL (ref 150–400)
RBC: 2.89 MIL/uL — ABNORMAL LOW (ref 3.87–5.11)
RDW: 14.1 % (ref 11.5–15.5)
WBC: 4.7 10*3/uL (ref 4.0–10.5)
nRBC: 0 % (ref 0.0–0.2)

## 2021-02-26 LAB — TROPONIN I (HIGH SENSITIVITY)
Troponin I (High Sensitivity): 86 ng/L — ABNORMAL HIGH (ref <18)
Troponin I (High Sensitivity): 84 ng/L — ABNORMAL HIGH (ref <18)

## 2021-02-26 LAB — PHOSPHORUS: Phosphorus: 6 mg/dL — ABNORMAL HIGH (ref 2.5–4.6)

## 2021-02-26 LAB — MAGNESIUM: Magnesium: 2.6 mg/dL — ABNORMAL HIGH (ref 1.7–2.4)

## 2021-02-26 MED ORDER — INSULIN ASPART 100 UNIT/ML IJ SOLN
0.0000 [IU] | Freq: Three times a day (TID) | INTRAMUSCULAR | Status: DC
Start: 2021-02-26 — End: 2021-03-03
  Administered 2021-02-26: 5 [IU] via SUBCUTANEOUS
  Administered 2021-02-27 – 2021-02-28 (×2): 3 [IU] via SUBCUTANEOUS
  Administered 2021-02-28: 2 [IU] via SUBCUTANEOUS
  Administered 2021-03-01: 5 [IU] via SUBCUTANEOUS
  Administered 2021-03-02: 3 [IU] via SUBCUTANEOUS
  Administered 2021-03-02: 2 [IU] via SUBCUTANEOUS
  Administered 2021-03-02: 3 [IU] via SUBCUTANEOUS
  Administered 2021-03-03 (×2): 2 [IU] via SUBCUTANEOUS

## 2021-02-26 MED ORDER — METOLAZONE 5 MG PO TABS: 5.0000 mg | ORAL_TABLET | Freq: Three times a day (TID) | ORAL | Status: DC

## 2021-02-26 MED ORDER — METOLAZONE 5 MG PO TABS
5.0000 mg | ORAL_TABLET | Freq: Once | ORAL | Status: AC
  Administered 2021-02-26: 5 mg via ORAL
  Filled 2021-02-26: qty 1

## 2021-02-26 NOTE — Progress Notes (Signed)
  Mobility Specialist Criteria Algorithm Info.  Mobility Team: HOB elevated: Activity: Ambulated in room; Transferred to/from [facility]; Transferred:  Bed to chair (to chair after ambulation) Range of motion: Active; All extremities Level of assistance: Contact guard assist, steadying assist Assistive device: Front wheel walker Minutes sitting in chair:  Minutes stood: 3 minutes Minutes ambulated: 3 minutes Distance ambulated (ft): 25 ft Mobility response: Tolerated well Bed Position: Chair  Patient agreed to participate in mobility after max encouragement + education on the importance of mobility. She got to the EOB independently and stood w/ cues for hand placement on RW. Ambulated in room to [facility] at min G with steady gait. Tolerated ambulation well without complaint or incident and was left sitting in recliner chair with all needs met and NT present.  02/26/2021 3:20 PM

## 2021-02-26 NOTE — Progress Notes (Signed)
PROGRESS NOTE    Ashley [name]  WTU:[id number] DOB: Oct 17, 1945 DOA: 02/24/2021 PCP: Unk Pinto, MD     Brief Narrative:  Ashley [name] is a 75 y.o. WF PMHx  HTN, COPD, DM type II, diastolic CHF last EF 60 -17% with grade 1 diastolic dysfunction, DVT no longer on Coumadin, CKD stage 5, CVA, iron deficiency anemia, gait disturbance, anxiety/bipolar disorder, lung cancer in remission, valvular cancer in remission, and GERD presents with complaints of shortness of breath over the last 2 to 3 weeks.  At baseline patient reports that she has issues with balance and despite using a cane is prone to fall.  Over the last 2 to 3 weeks patient reports that she has been short of breath, but is not normally on oxygen at baseline.  She had been smoking up until February of this year when she decided to quit.  Patient complains of having difficulty sleeping at night feeling as though she is suffocating having to sit up to catch her breath.  Associated symptoms include chest discomfort, wheezing, and leg swelling left leg worse than the right.  She reports her primary care doctor had told her she had fluid around her heart and had being given pills to help get some of the fluid off, but reports that she has gained approximately 10 pounds over the last 2 weeks.  Patient gets tearful and reports that her husband of 7 years does not love her and that he just wants her money to gamble.  She states that her brother is her power of attorney and is overall of her healthcare decisions.  Over the last 2 days or so that she had not been taking any of her medicines as she should.   The patient's brother over the phone adds that the patient has been depressed and basically sits at home all day and is not taking her medications as she should.  He feels that she needs to be put in some kind of nursing facility to ensure that her proper care and he would like her to be seen by nephrology if needed.   In route with EMS patient  was noted to blood pressure of 210/100, heart rate 90, respiration 22, and O2 saturations reported to be 99% on room air.   ED Course: Upon admission into the emergency department patient was seen to be afebrile, respirations 19-24, blood pressures 171/88-207/110, and O2 saturations currently maintained on 2.5 L of nasal cannula.  Labs significant for WBC 6, hemoglobin 9.7, sodium 134, BUN 64, creatinine 4.15, glucose 207, BNP 2713.7, and high-sensitivity troponin 92->92.  Chest x-ray showed increasing hazy and patchy opacities in the lower lungs concerning for atelectasis and edema.  Influenza and COVID-19 screening were negative.  Patient had been given 80 mg of Lasix IV, DuoNeb breathing treatment, and labetalol 5 mg IV.  TRH was called to admit and patient was excepted as an inpatient to a telemetry bed.   Subjective: 7/12 afebrile overnight A/O x4, sitting in bed comfortably.   Assessment & Plan: Covid vaccination; vaccinated 2/3   Principal Problem:   Acute CHF (congestive heart failure) (HCC) Active Problems:   Major depressive disorder, recurrent episode (HCC)   COPD   Abnormality of gait   Hypothyroidism   Memory difficulty   Hyperlipidemia associated with type 2 diabetes mellitus ([place])   Hypertensive urgency   Elevated troponin   CKD (chronic kidney disease) stage 5, GFR less than 15 ml/min ([place])   DNR (do  not resuscitate)  Acute on Chronic Diastolic CHF -Patient with pitting edema on physical exam and JVD. -Chest x-ray significant for increased hazy opacities in the lower lungs concerning for atelectasis and edema with pulmonary vascular congestion.   -BNP was 2713.7. -09/2020 echocardiogram: EF of 60-65% with grade 1 diastolic dysfunction -Strict in and out -1.3 L - Daily weight -Lasix IV 80 mg TID -7/12 Zaroxolyn 5 mg x1  Elevated troponin/demand ischemia - Most consistent with demand ischemia given patient's noncompliance with her medications and elevated BP. -Will  continue to monitor   COPD - Stable shortness of breath secondary to anasarca    CKD stage IV (baseline Cr 4.15) -followed by Dr. [name] of nephrology -Nephrology consulted Lab Results  Component Value Date   CREATININE 4.69 (H) 02/26/2021   CREATININE 4.47 (H) 02/25/2021   CREATININE 4.15 (H) 02/24/2021   CREATININE 4.35 (H) 01/15/2021   CREATININE 3.53 (H) 11/14/2020    DM type II controlled - 5/31 hemoglobin A1c=6.3 -Moderate SSI   Anemia of chronic  disease:  Lab Results  Component Value Date   HGB 8.5 (L) 02/26/2021   HGB 9.5 (L) 02/24/2021   HGB 9.7 (L) 02/24/2021   HGB 8.8 (L) 01/15/2021   HGB 8.7 (L) 11/14/2020  -No obvious sequela of bleeding -7/12 fecal occult pending  History of DVT:  -Patient previously suffered a DVT of the right lower extremity back in 2021.   -Coumadin until 10/2020 after presenting with fall and left leg hematoma quiring transfusion of 2 units of packed red blood cell.    Major Depression with recurrent episode Anxiety:  -Patient currently appears to be depressed, and reports a lack of well of wanting to live.   -She is followed in outpatient setting by psychiatry. -Continue Prozac and clorazepate as needed -Recommend outpatient follow-up with her psychiatrist   Hypothyroidism:  -TSH was 1.88 on 5/31. -Check TSH -Continue levothyroxine   HLD -Continue atorvastatin and fenofibrate   GERD -Continue Pepcid  History of CVA memory deficit   Debility secondary to gait disturbance: Has resolved patient's previous stroke she ambulates with use of a walker at baseline but is at high risk oflls and her brother reports that she has issues with her memory as well.  He thinks that she would benefit from being placed in a skilled nursing facility -PT/OT to eval and treat.     Goals of care - 7/11 palliative care consult: Admitted basically because not taking any of her medications, who has multisystem organ failure.  Evaluate for  palliative care at home vs hospice      DVT prophylaxis: Subcutaneous heparin  code Status: DNR Family Communication:  Status is: Inpatient    Dispo: The patient is from: Home              Anticipated d/c is to: SNF              Anticipated d/c date is: > 3 days              Patient currently is not medically stable to d/c.      Consultants:  Nephrology  Procedures/Significant Events:    I have personally reviewed and interpreted all radiology studies and my findings are as above.  VENTILATOR SETTINGS:    Cultures   Antimicrobials:    Devices    LINES / TUBES:      Continuous Infusions:  sodium chloride       Objective: Vitals:   02/26/21 0200  02/26/21 0541 02/26/21 0542 02/26/21 1133  BP:   (!) 162/74 (!) 145/69  Pulse:   83 81  Resp:   18 18  Temp:   98.5 F (36.9 C) 98 F (36.7 C)  TempSrc:   Oral Oral  SpO2: 97%  98% 100%  Weight:  74.8 kg    Height:        Intake/Output Summary (Last 24 hours) at 02/26/2021 1402 Last data filed at 02/26/2021 1100 Gross per 24 hour  Intake 600 ml  Output 1850 ml  Net -1250 ml    Filed Weights   02/24/21 1454 02/25/21 0313 02/26/21 0541  Weight: 73.1 kg 72.8 kg 74.8 kg    Examination:  General: A/O x4, No acute respiratory distress Eyes: negative scleral hemorrhage, negative anisocoria, negative icterus ENT: Negative Runny nose, negative gingival bleeding, Neck:  Negative scars, masses, torticollis, lymphadenopathy, JVD Lungs: decreased breath sounds bibasilar without wheezes or crackles Cardiovascular: Regular rate and rhythm without murmur gallop or rub normal S1 and S2 Abdomen: negative abdominal pain, nondistended, positive soft, bowel sounds, no rebound, positive ascites, no appreciable mass Extremities: positive anasarca Skin: Negative rashes, lesions, ulcers Psychiatric:  Negative depression, negative anxiety, negative fatigue, negative mania  Central nervous system:  Cranial nerves  II through XII intact, tongue/uvula midline, all extremities muscle strength 5/5, sensation intact throughout, negative dysarthria, negative expressive aphasia, negative receptive aphasia.  .     Data Reviewed: Care during the described time interval was provided by me .  I have reviewed this patient's available data, including medical history, events of note, physical examination, and all test results as part of my evaluation.  CBC: Recent Labs  Lab 02/24/21 0210 02/24/21 0316 02/26/21 0151  WBC 6.0  --  4.7  NEUTROABS 4.8  --  3.4  HGB 9.7* 9.5* 8.5*  HCT 30.3* 28.0* 26.4*  MCV 94.1  --  91.3  PLT 298  --  703    Basic Metabolic Panel: Recent Labs  Lab 02/24/21 0210 02/24/21 0316 02/25/21 0501 02/26/21 0151  NA 134* 135 133* 136  K 3.7 3.6 3.6 3.5  CL 101  --  98 99  CO2 21*  --  21* 22  GLUCOSE 207*  --  132* 141*  BUN 64*  --  70* 76*  CREATININE 4.15*  --  4.47* 4.69*  CALCIUM 9.0  --  9.2 9.6  MG  --   --   --  2.6*  PHOS  --   --   --  6.0*    GFR: Estimated Creatinine Clearance: 10.4 mL/min (A) (by C-G formula based on SCr of 4.69 mg/dL (H)). Liver Function Tests: Recent Labs  Lab 02/24/21 0210 02/26/21 0151  AST 16 15  ALT 12 16  ALKPHOS 24* 23*  BILITOT 0.8 0.7  PROT 6.9 6.5  ALBUMIN 3.3* 3.1*    No results for input(s): LIPASE, AMYLASE in the last 168 hours. No results for input(s): AMMONIA in the last 168 hours. Coagulation Profile: No results for input(s): INR, PROTIME in the last 168 hours. Cardiac Enzymes: No results for input(s): CKTOTAL, CKMB, CKMBINDEX, TROPONINI in the last 168 hours. BNP (last 3 results) No results for input(s): PROBNP in the last 8760 hours. HbA1C: No results for input(s): HGBA1C in the last 72 hours. CBG: Recent Labs  Lab 02/25/21 1107 02/25/21 1640 02/25/21 2110 02/26/21 0551 02/26/21 1130  GLUCAP 117* 103* 187* 109* 237*    Lipid Profile: No results for input(s): CHOL, HDL,  LDLCALC, TRIG, CHOLHDL,  LDLDIRECT in the last 72 hours. Thyroid Function Tests: Recent Labs    02/24/21 1610  TSH 1.618    Anemia Panel: No results for input(s): VITAMINB12, FOLATE, FERRITIN, TIBC, IRON, RETICCTPCT in the last 72 hours. Sepsis Labs: No results for input(s): PROCALCITON, LATICACIDVEN in the last 168 hours.  Recent Results (from the past 240 hour(s))  Resp Panel by RT-PCR (Flu A&B, Covid) Nasopharyngeal Swab     Status: None   Collection Time: 02/24/21  3:04 AM   Specimen: Nasopharyngeal Swab; Nasopharyngeal(NP) swabs in vial transport medium  Result Value Ref Range Status   SARS Coronavirus 2 by RT PCR NEGATIVE NEGATIVE Final    Comment: (NOTE) SARS-CoV-2 target nucleic acids are NOT DETECTED.  The SARS-CoV-2 RNA is generally detectable in upper respiratory specimens during the acute phase of infection. The lowest concentration of SARS-CoV-2 viral copies this assay can detect is 138 copies/mL. A negative result does not preclude SARS-Cov-2 infection and should not be used as the sole basis for treatment or other patient management decisions. A negative result may occur with  improper specimen collection/handling, submission of specimen other than nasopharyngeal swab, presence of viral mutation(s) within the areas targeted by this assay, and inadequate number of viral copies(<138 copies/mL). A negative result must be combined with clinical observations, patient history, and epidemiological information. The expected result is Negative.  Fact Sheet for Patients:  EntrepreneurPulse.com.au  Fact Sheet for Healthcare Providers:  IncredibleEmployment.be  This test is no t yet approved or cleared by the [place] FDA and  has been authorized for detection and/or diagnosis of SARS-CoV-2 by FDA under an Emergency Use Authorization (EUA). This EUA will remain  in effect (meaning this test can be used) for the duration of the COVID-19 declaration under  Section 564(b)(1) of the Act, 21 U.S.C.section 360bbb-3(b)(1), unless the authorization is terminated  or revoked sooner.       Influenza A by PCR NEGATIVE NEGATIVE Final   Influenza B by PCR NEGATIVE NEGATIVE Final    Comment: (NOTE) The Xpert Xpress SARS-CoV-2/FLU/RSV plus assay is intended as an aid in the diagnosis of influenza from Nasopharyngeal swab specimens and should not be used as a sole basis for treatment. Nasal washings and aspirates are unacceptable for Xpert Xpress SARS-CoV-2/FLU/RSV testing.  Fact Sheet for Patients: EntrepreneurPulse.com.au  Fact Sheet for Healthcare Providers: IncredibleEmployment.be  This test is not yet approved or cleared by the [place] FDA and has been authorized for detection and/or diagnosis of SARS-CoV-2 by FDA under an Emergency Use Authorization (EUA). This EUA will remain in effect (meaning this test can be used) for the duration of the COVID-19 declaration under Section 564(b)(1) of the Act, 21 U.S.C. section 360bbb-3(b)(1), unless the authorization is terminated or revoked.  Performed at [facility], [place] [address] [place], [place] [address]           Radiology Studies: No results found.      Scheduled Meds:  amLODipine  10 mg Oral Daily   atorvastatin  40 mg Oral Daily   famotidine  20 mg Oral Daily   fenofibrate  160 mg Oral Daily   ferrous sulfate  325 mg Oral BID WC   FLUoxetine  20 mg Oral Daily   furosemide  80 mg Intravenous Q8H   heparin  5,000 Units Subcutaneous Q8H   hydrALAZINE  100 mg Oral Q8H   insulin aspart  0-15 Units Subcutaneous TID WC   levothyroxine  88 mcg  Oral QAC breakfast   sodium chloride flush  3 mL Intravenous Q12H   Continuous Infusions:  sodium chloride       LOS: 2 days    Time spent:40 min    Kately Graffam, Geraldo Docker, MD [facility]   If 7PM-7AM, please contact night-coverage 02/26/2021, 2:02 PM

## 2021-02-26 NOTE — Progress Notes (Signed)
[facility] Progress Note   Subjective:   UOP 2015mL overnight with furosemide 80 IV BID increased yesterday.  Remains on RA/2L [place] with 100% sat.  BP 162/74 this AM. Breathing improving but still with orthopnea.  Palliative care consult today.  Brother present.   Objective Vitals:   02/26/21 0100 02/26/21 0200 02/26/21 0541 02/26/21 0542  BP:    (!) 162/74  Pulse:    83  Resp:    18  Temp:    98.5 F (36.9 C)  TempSrc:    Oral  SpO2: 98% 97%  98%  Weight:   74.8 kg   Height:       Physical Exam General: chronically ill and anxious - not far from baseline  Heart:RRR, no rub Lungs: clear to bases Abdomen:soft Extremities:2+ LE edema, L > R, improving   Additional Objective Labs: Basic Metabolic Panel: Recent Labs  Lab 02/24/21 0210 02/24/21 0316 02/25/21 0501 02/26/21 0151  NA 134* 135 133* 136  K 3.7 3.6 3.6 3.5  CL 101  --  98 99  CO2 21*  --  21* 22  GLUCOSE 207*  --  132* 141*  BUN 64*  --  70* 76*  CREATININE 4.15*  --  4.47* 4.69*  CALCIUM 9.0  --  9.2 9.6  PHOS  --   --   --  6.0*    Liver Function Tests: Recent Labs  Lab 02/24/21 0210 02/26/21 0151  AST 16 15  ALT 12 16  ALKPHOS 24* 23*  BILITOT 0.8 0.7  PROT 6.9 6.5  ALBUMIN 3.3* 3.1*    No results for input(s): LIPASE, AMYLASE in the last 168 hours. CBC: Recent Labs  Lab 02/24/21 0210 02/24/21 0316 02/26/21 0151  WBC 6.0  --  4.7  NEUTROABS 4.8  --  3.4  HGB 9.7* 9.5* 8.5*  HCT 30.3* 28.0* 26.4*  MCV 94.1  --  91.3  PLT 298  --  268    Blood Culture    Component Value Date/Time   SDES  11/07/2020 1255    Urine Performed at [facility], [place] [address], [place], [place] [address]    SPECREQUEST  11/07/2020 1255    NONE Performed at [facility], [place] [address] [place], [place] [address]    CULT >=100,000 COLONIES/mL KLEBSIELLA PNEUMONIAE (A) 11/07/2020 1255   REPTSTATUS 11/10/2020 FINAL 11/07/2020 1255    Cardiac  Enzymes: No results for input(s): CKTOTAL, CKMB, CKMBINDEX, TROPONINI in the last 168 hours. CBG: Recent Labs  Lab 02/25/21 0549 02/25/21 1107 02/25/21 1640 02/25/21 2110 02/26/21 0551  GLUCAP 126* 117* 103* 187* 109*    Iron Studies: No results for input(s): IRON, TIBC, TRANSFERRIN, FERRITIN in the last 72 hours. @lablastinr3 @ Studies/Results: No results found. Medications:  sodium chloride      amLODipine  10 mg Oral Daily   atorvastatin  40 mg Oral Daily   famotidine  20 mg Oral Daily   fenofibrate  160 mg Oral Daily   ferrous sulfate  325 mg Oral BID WC   FLUoxetine  20 mg Oral Daily   furosemide  80 mg Intravenous Q8H   heparin  5,000 Units Subcutaneous Q8H   hydrALAZINE  100 mg Oral Q8H   insulin aspart  0-15 Units Subcutaneous TID WC   levothyroxine  88 mcg Oral QAC breakfast   sodium chloride flush  3 mL Intravenous Q12H    Assessment/Recommendations:    CKD5, stable: Cr seems to be  around 4. Underlying etiology of CKD likely secondary to DM and HTN -kidney function around baseline and no uremic symptoms but she is very close to needing to start renal replacement therapy.    -Will continue to manage medically and readdress daily course re: dialysis.  She feels conflicted re: dialysis but is clear she's not ready to do hospice/comfort care should she reach ESRD.  Really appreciate palliative care helping in this discussion.  She has a lot of personal/life stressors that are impacting her as well. Discussed with brother as well  -Continue to monitor daily Cr, Dose meds for GFR<15 -Monitor Daily I/Os, Daily weight -Maintain MAP>65 for optimal renal perfusion. -Agree with holding ACE-I, avoid further nephrotoxins including NSAIDS, Morphine.  Unless absolutely necessary, avoid CT with contrast and/or MRI with gadolinium.        Acute on chronic dCHF exacerbation: presented with symptoms of orthopnea and dyspnea, wt gain.  - Good diuresis overnight but still with  volume, lasix 80 TID with metolazone for now -Seems main issue was skipping meds and dietary indiscretion that led to the presentation   Hypertension: -diuresis and home meds   Anemia due to chronic kidney disease: -Transfuse for Hgb<7 g/dL -checking iron panel   Diabetes Mellitus Type 2  -per primary service; last A1c 01/15/21 was 6.3  Jannifer Hick MD 02/26/2021, 10:55 AM  [facility] Pager: [phone]

## 2021-02-26 NOTE — Progress Notes (Signed)
This chaplain responded to PMT consult for spiritual care.  The Pt. is appreciative of the chaplain visit. The chaplain reassures the Pt. of the chaplain's willingness to be present. The Pt. husband-George arrives at the end of the visit.  The chaplain practices reflective listening as the Pt. participates in story telling about her relationship with her husband-George, brother-Larry, and her deceased mother.  The chaplain understands the three people are very influential in the Pt. story. The familial relationships intersect with the Pt. [employer] faith tradition and inform her goals of care.   The chaplain understands the Pt. is reflecting on the choice of HD with the chaplain, because the Pt. has always experienced "anxiety" in a healthcare setting. The chaplain continued to listen and understands the Pt faith tradition and scripture  reflects, not choosing HD as suicide.  The chaplain will continue, in partnership with the PMT, to unpack the Pt. story in her statements.  This chaplain can be reached at [phone] for F/U spiritual care.

## 2021-02-26 NOTE — Consult Note (Addendum)
Palliative Medicine Inpatient Consult Note  Reason for consult:  "Admitted basically because not taking any of her medications, who has multisystem organ failure.  Evaluate for palliative care at home vs hospice"  HPI:  Per intake H&P --> Ashley [name] is a 75 y.o. WF PMHx  HTN, COPD, DM type II, Ashley [name] is a 75 y.o. WF PMHx  HTN, COPD, DM type II, diastolic CHF last EF 60 -00% with grade 1 diastolic dysfunction, DVT no longer on Coumadin, CKD stage 5, CVA, iron deficiency anemia, gait disturbance, anxiety/bipolar disorder, lung cancer in remission, valvular cancer in remission, and GERD presents with complaints of shortness of breath over the last 2 to 3 weeks.  At baseline patient reports that she has issues with balance and despite using a cane is prone to fall.  Over the last 2 to 3 weeks patient reports that she has been short of breath, but is not normally on oxygen at baseline.  She had been smoking up until February of this year when she decided to quit.  Patient complains of having difficulty sleeping at night feeling as though she is suffocating having to sit up to catch her breath.  Associated symptoms include chest discomfort, wheezing, and leg swelling left leg worse than the right.  Palliative care has been asked to get involved to further help aid in goals of care conversations.  Ashley [name] is in a position where she will either need to elect for hemodialysis treatments or potential hospice care.  Clinical Assessment/Goals of Care:  *Please note that this is a verbal dictation therefore any spelling or grammatical errors are due to the "[place] [employer]" system interpretation.  I have reviewed medical records including EPIC notes, labs and imaging, received report from bedside RN, assessed the patient who is in no distress at the time of assessment sitting up in the bed.    I met with Ashley [name] to further discuss diagnosis prognosis, GOC, EOL wishes, disposition and  options.  Review of her past medical history was completed inclusive of diastolic CHF last EF 60 -93% with grade 1 diastolic dysfunction, DVT no longer on Coumadin, CKD stage 5, CVA, iron deficiency anemia, gait disturbance, anxiety/bipolar disorder, lung cancer in remission, valvular cancer in remission.  We reviewed that many of her life habits such as smoking and drinking have led to her poor health consequences.  Ashley [name] shares with me that she recognizes that she has a lot of "heart problems" leading to buildup of fluid around her lungs making her feel quite short of breath.  She points out to me that her left leg is more edematous than her right.  She shares with me that she has trouble when she mobilizes with her breathing.  Ashley [name] expresses that Dr. Johnney [name] had shared with her if she does not accept dialysis then she will have to pursue hospice care.  Described hospice as a service for patients for have a life expectancy of < 6 months. It preserves dignity and quality at the end phases of life. The focus changes from curative to symptom relief.  Ashley [name] shares "I am scared to death".  Try to explore these feelings more in depth.  And Ashley [name] was able to share with me that she does not like being compliant with medical care though she is also afraid of dying.  She expresses to me that she is not sure she could tolerate dialysis.  We reviewed that dialysis can be very overwhelming and costly  to the body.  Ashley [name] also has a fear of needles which she would need to indoor quite frequently.  We discussed that if an intervention affects her quality of life to the point where it is more of a hindrance and causes greater anxiety that the benefits may be not worth it.  Ashley [name] expresses that if she does not pursue dialysis in the eyes of the [place] she is "committing suicide".  I tried to reaffirm that this is by no measure the case and if she decided not to pursue dialysis this is a decision that she is within her own right  to make.  Ashley [name] and I reviewed her long history of noncompliance and her lack of appreciating medical care.  I shared with her my concerns about starting dialysis as I worry it will be something that she will potentially not follow through with.  She expresses that this may very well be the case.  Ashley [name] reveals to me that she is very suspicious that her husband is only  caring for her because he is a compulsive gambler and would like to get a large sum of money from her death.  I allowed her time to express these fears and throughout our conversation she shared that she loves her husband desperately.  At some point she told me how wonderful he is in our conversation while at other point she seemed to focus on all of the negatives that he possesses.   I introduced Palliative Medicine as specialized medical care for people living with serious illness. It focuses on providing relief from the symptoms and stress of a serious illness. The goal is to improve quality of life for both the patient and the family.  Ashley [name] shares with me that she is lived in [place] throughout her whole life.  She has been married 3 times and her most recent husband, Ashley [name] and she had been wed for the past 36 years.  She shares that she wanted children desperately though she was unable to have them.  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  She shares that she enjoys watching television in the home though other than this she does not have much enjoyment in life.  Ashley [name] is a Ashley [name] and was raised as a [place] with me that is a woman she has a tremendous amount of insecurities stemming from her being overweight in her younger years.  She shares that she has a lot of jealousy towards other women who have a more desirable figure.  Prior to admission to the hospital Ashley [name] had been living in a single-family home with her husband,  Ashley [name].  She shares with me that she does use a walker for mobility as she uses a wheelchair.  A detailed discussion was had today regarding advanced directives -Ashley [name] shares that she has completed these in the past and her brother, Ashley [name] has them.  A copy of advance directives has been requested.    Concepts specific to code status, artifical feeding and hydration, continued IV antibiotics and rehospitalization was had.  MOST form was completed as below:  Cardiopulmonary Resuscitation: Do Not Attempt Resuscitation (DNR/No CPR)  Medical Interventions: Limited Additional Interventions: Use medical treatment, IV fluids and cardiac monitoring as indicated, DO NOT USE intubation or mechanical ventilation. May consider use of less invasive airway support such as BiPAP or CPAP. Also provide comfort measures. Transfer to the hospital if  indicated. Avoid intensive care.   Antibiotics: Determine use of limitation of antibiotics when infection occurs  IV Fluids: IV fluids for a defined trial period  Feeding Tube: No feeding tube   Shanaye is willing to have our chaplain, Ashley [name] meet with her to further explores her believes on death and dying as they are related to her religious values.  We have set goals for the day which include getting out of bed, getting her hair washed, and getting make-up on.  Ashley [name] shares that her appearance is very important to her.  Discussed the importance of continued conversation with family and their  medical providers regarding overall plan of care and treatment options, ensuring decisions are within the context of the patients values and GOCs.  Provided  "Hard Choices for [employer]" booklet.  ____________________________________________________________ Addendum:  I spoke to Ashley [name] on the phone, he says that it is very difficult to consider how ill his sister is further to consider the idea of losing her.  We reviewed that Ryian is all Ashley [name] has left and that both of his  parents are deceased.  His parents died at the age of 56. He shares that he is her HCPOA and will bring the paperwork this afternoon to support this. We reviewed the importance of considering dialysis or hospice care.  ______________________________________________________________ Addendum #2:  I met this afternoon with Ashley [name] and her brother, Ashley [name]. We reviewed her poor kidney function and how she is encroaching upon needing dialysis. We discussed what going to hemodialysis treatments regularly may look like. Isel shares that she does not think her husband will be able to accommodate driving her 3x weekly. We discussed an AV fistula and access options. Reviewed the process of dialysis and the number of hours Ashley [name] would need to sit in a recliner. Reviewed the potential for lethargy and weakness and also the idea of "feeling better". Ashley [name] shares that her gut tells her "I don't want to do it."  She goes on to share, "I don't want to die, but I don't want to do dialysis." Allowed her time to express these thoughts out loud. Provided a safe space for her to further elucidate her fears.  We discussed that if Tomeko does not choose dialysis we have another option which would be possible to maintain comprehensive symptom relief. Discussed that this would be the pursuit of hospice care. We talked about what hospice in the home versus an inpatient facility looks like. Discussed the comprehensive symptom management that is pursued with hospice care. Reviewed the liberalization of her diet at this point at well. Hiilani brought up a variety of stories about friends who died in hospice homes. Reviewed both good and bad experiences. We talked about her going home with hospice as this presently is where she would prefer to be. She expresses that she would  Like to be home but worries that her home is like a "hoarder" and there is no where to sit. Discussed the need to clean some of her home out if hospice is needed to  further aid in the nursing staff coming in and out.  We resolved that Elexia would like information from the renal navigator and the home hospice agency to further weight her options. She continued to share in conversation though that she really does not want dialysis though she needs "more time to think". I shared that I would be happy to get her more information to help support her decision either way.  I have reached  out to renal navigator Ashley [name] and TOC CM, Ashley [name] to aid in supporting additional questions.  I have now obtained a copy of advance directives indicating Ashley [name] as the HCPOA formally.  Time In: 1505 Time Out: 1608 Total Time: 63 additional minutes Greater than 50%  of this time was spent counseling and coordinating care related to the above assessment and plan.  Decision Maker: Ashley [name] (brother) [phone]  SUMMARY OF RECOMMENDATIONS   DNAR/DNI  MOST Completed, paper copy placed onto the chart electric copy can be found in [facility]  DNR Form Completed, paper copy placed onto the chart electric copy can be found in [facility]  Obtained a copy of advance directives - Will scan into [facility]  Patient is trying to weight out the risks and benefits of potentially starting hemodialysis  Appreciate HD social worker support  Appreciate TOC helping to set up a meeting with hospice for informational purposes  Appreciate Chaplain support to aid in questions of mortality and fear of dying as it relates to the [facility] faith  Ongoing PMT support  Code Status/Advance Care Planning: DNAR/DNI   Palliative Prophylaxis:  Oral Care, Mobility  Additional Recommendations (Limitations, Scope, Preferences): Treat what is treatable  Psycho-social/Spiritual:  Desire for further Chaplaincy support: Yes Additional Recommendations: Education on chronic diseases - heart failure and CKD progression   Prognosis: Poor in the setting of worsening kidney failure and diastolic  heart failure  Discharge Planning: Unclear presently  Vitals:   02/26/21 0200 02/26/21 0542  BP:  (!) 162/74  Pulse:  83  Resp:  18  Temp:  98.5 F (36.9 C)  SpO2: 97% 98%    Intake/Output Summary (Last 24 hours) at 02/26/2021 0659 Last data filed at 02/26/2021 0204 Gross per 24 hour  Intake 600 ml  Output 1275 ml  Net -675 ml   Last Weight  Most recent update: 02/26/2021  5:42 AM    Weight  74.8 kg (164 lb 14.5 oz)            Gen:  Elderly F in NAD HEENT: moist mucous membranes CV: Regular rate and rhythm  PULM: On RA ABD: soft/nontender EXT: No edema  Neuro:  A+O x3, tangential   PPS: 50%   This conversation/these recommendations were discussed with patient primary care team, Dr. Sherral Hammers via telephone  Time In: 0640 Time Out: 0842 Total Time: 122 Greater than 50%  of this time was spent counseling and coordinating care related to the above assessment and plan.  [facility] Team Team Cell Phone: [phone] Please utilize secure chat with additional questions, if there is no response within 30 minutes please call the above phone number  Palliative Medicine Team providers are available by phone from 7am to 7pm daily and can be reached through the team cell phone.  Should this patient require assistance outside of these hours, please call the patient's attending physician.

## 2021-02-27 DIAGNOSIS — N185 Chronic kidney disease, stage 5: Secondary | ICD-10-CM | POA: Diagnosis not present

## 2021-02-27 DIAGNOSIS — Z66 Do not resuscitate: Secondary | ICD-10-CM

## 2021-02-27 DIAGNOSIS — R778 Other specified abnormalities of plasma proteins: Secondary | ICD-10-CM

## 2021-02-27 DIAGNOSIS — I5031 Acute diastolic (congestive) heart failure: Secondary | ICD-10-CM

## 2021-02-27 LAB — IRON AND TIBC
Iron: 68 ug/dL (ref 28–170)
Saturation Ratios: 23 % (ref 10.4–31.8)
TIBC: 298 ug/dL (ref 250–450)
UIBC: 230 ug/dL

## 2021-02-27 LAB — COMPREHENSIVE METABOLIC PANEL
ALT: 14 U/L (ref 0–44)
AST: 13 U/L — ABNORMAL LOW (ref 15–41)
Albumin: 3 g/dL — ABNORMAL LOW (ref 3.5–5.0)
Alkaline Phosphatase: 23 U/L — ABNORMAL LOW (ref 38–126)
Anion gap: 13 (ref 5–15)
BUN: 81 mg/dL — ABNORMAL HIGH (ref 8–23)
CO2: 22 mmol/L (ref 22–32)
Calcium: 9.1 mg/dL (ref 8.9–10.3)
Chloride: 99 mmol/L (ref 98–111)
Creatinine, Ser: 4.69 mg/dL — ABNORMAL HIGH (ref 0.44–1.00)
GFR, Estimated: 9 mL/min — ABNORMAL LOW (ref >60)
Glucose, Bld: 105 mg/dL — ABNORMAL HIGH (ref 70–99)
Potassium: 3.2 mmol/L — ABNORMAL LOW (ref 3.5–5.1)
Sodium: 134 mmol/L — ABNORMAL LOW (ref 135–145)
Total Bilirubin: 0.4 mg/dL (ref 0.3–1.2)
Total Protein: 5.9 g/dL — ABNORMAL LOW (ref 6.5–8.1)

## 2021-02-27 LAB — GLUCOSE, CAPILLARY
Glucose-Capillary: 102 mg/dL — ABNORMAL HIGH (ref 70–99)
Glucose-Capillary: 173 mg/dL — ABNORMAL HIGH (ref 70–99)
Glucose-Capillary: 117 mg/dL — ABNORMAL HIGH (ref 70–99)
Glucose-Capillary: 154 mg/dL — ABNORMAL HIGH (ref 70–99)

## 2021-02-27 LAB — CBC WITH DIFFERENTIAL/PLATELET
Abs Immature Granulocytes: 0.04 10*3/uL (ref 0.00–0.07)
Basophils Absolute: 0 10*3/uL (ref 0.0–0.1)
Basophils Relative: 1 %
Eosinophils Absolute: 0.1 10*3/uL (ref 0.0–0.5)
Eosinophils Relative: 2 %
HCT: 24.3 % — ABNORMAL LOW (ref 36.0–46.0)
Hemoglobin: 7.9 g/dL — ABNORMAL LOW (ref 12.0–15.0)
Immature Granulocytes: 1 %
Lymphocytes Relative: 17 %
Lymphs Abs: 0.8 10*3/uL (ref 0.7–4.0)
MCH: 29.7 pg (ref 26.0–34.0)
MCHC: 32.5 g/dL (ref 30.0–36.0)
MCV: 91.4 fL (ref 80.0–100.0)
Monocytes Absolute: 0.4 10*3/uL (ref 0.1–1.0)
Monocytes Relative: 9 %
Neutro Abs: 3.4 10*3/uL (ref 1.7–7.7)
Neutrophils Relative %: 70 %
Platelets: 269 10*3/uL (ref 150–400)
RBC: 2.66 MIL/uL — ABNORMAL LOW (ref 3.87–5.11)
RDW: 14.3 % (ref 11.5–15.5)
WBC: 4.9 10*3/uL (ref 4.0–10.5)
nRBC: 0 % (ref 0.0–0.2)

## 2021-02-27 LAB — PHOSPHORUS: Phosphorus: 6.6 mg/dL — ABNORMAL HIGH (ref 2.5–4.6)

## 2021-02-27 LAB — MAGNESIUM: Magnesium: 2.5 mg/dL — ABNORMAL HIGH (ref 1.7–2.4)

## 2021-02-27 LAB — FERRITIN: Ferritin: 189 ng/mL (ref 11–307)

## 2021-02-27 MED ORDER — DARBEPOETIN ALFA 40 MCG/0.4ML IJ SOSY
40.0000 ug | PREFILLED_SYRINGE | INTRAMUSCULAR | Status: DC
  Administered 2021-02-27: 40 ug via SUBCUTANEOUS
  Filled 2021-02-27: qty 0.4

## 2021-02-27 NOTE — Progress Notes (Signed)
Physical Therapy Treatment Patient Details Name: Ashley [name] MRN: [id number] DOB: 1946-07-29 Today's Date: 02/27/2021    History of Present Illness 75 y/o female preesenting on 7/10 for shortrness of breath over last 2-3 weeks. CXR showed increasing hazy and patchy opacities in the lower lungs concerning for atelectasis and edema. PMH: HTN, COPD, DM type II, diastolic CHF last EF 60 -26% with grade 1 diastolic dysfunction, DVT no longer on Coumadin, CKD stage 5, CVA, iron deficiency anemia, gait disturbance, anxiety/bipolar disorder, lung cancer in remission, valvular cancer in remission, and GERD.    PT Comments    Pt reports mobilizing with mobility specialist prior to PT arrival, but agreeable to strengthening exercises and EOB activity. Pt overall requiring light min assist for bed mobility and transfers, short-distance gait with quad cane attempted but pt very unsteady with this and reports significant fatigue. Pt tolerated repeated sit to stands and LE supine exercise well. Pt tangential at times, requires cues to stay on task. Will continue to follow.    Follow Up Recommendations  Home health PT;Supervision for mobility/OOB (Pt may refuse)     Equipment Recommendations  Rolling walker with 5" wheels    Recommendations for Other Services       Precautions / Restrictions Precautions Precautions: Fall Restrictions Weight Bearing Restrictions: No    Mobility  Bed Mobility Overal bed mobility: Needs Assistance             General bed mobility comments: min assist for LE management, pt able to scoot self to/from EOB with increased time and effort.    Transfers Overall transfer level: Needs assistance Equipment used: Quad cane Transfers: Sit to/from Stand Sit to Stand: Min assist         General transfer comment: light min assist for initial power up and steadying upon standing, pt requesting to use her quad cane vs RW as this is what pt uses at  home.  Ambulation/Gait Ambulation/Gait assistance: Min assist Gait Distance (Feet): 2 Feet Assistive device: Quad cane Gait Pattern/deviations: Step-through pattern;Decreased stride length;Trunk flexed;Shuffle Gait velocity: decr   General Gait Details: min assist to steady, x2 steps forward and back only with quad cane as pt stating she "already walked today" (with mobility specialist) and unsteady with quad cane.   Stairs             Wheelchair Mobility    Modified Rankin (Stroke Patients Only)       Balance Overall balance assessment: Needs assistance;History of Falls Sitting-balance support: Feet supported Sitting balance-Leahy Scale: Fair Sitting balance - Comments: able to sit EOB >10 minutes with supervision assist, lateral weight shifting with elbow propping, scooting to/from EOB.   Standing balance support: During functional activity;Single extremity supported Standing balance-Leahy Scale: Poor Standing balance comment: reliant on external support                            Cognition Arousal/Alertness: Awake/alert Behavior During Therapy: WFL for tasks assessed/performed Overall Cognitive Status: History of cognitive impairments - at baseline                                 General Comments: per chart review, pt with baseline memory deficits. Requires cues to stay on task, tangential in conversation.      Exercises Other Exercises Other Exercises: sit<>stands from EOB with SL support, x5, cues for slow eccentric lower  and min assist for power up and lower Other Exercises: SLR in supine, x10 bilaterally    General Comments General comments (skin integrity, edema, etc.): SpO2 99-100% on 2LO2 - pt declines taking O2 off stating "it helps my wheezing", other VSS      Pertinent Vitals/Pain Pain Assessment: No/denies pain    Home Living                      Prior Function            PT Goals (current goals can now  be found in the care plan section) Acute Rehab PT Goals PT Goal Formulation: With patient Time For Goal Achievement: 03/11/21 Potential to Achieve Goals: Good Progress towards PT goals: Not progressing toward goals - comment (declines much OOB mobility)    Frequency    Min 3X/week      PT Plan Current plan remains appropriate    Co-evaluation              AM-PAC PT "6 Clicks" Mobility   Outcome Measure  Help needed turning from your back to your side while in a flat bed without using bedrails?: None Help needed moving from lying on your back to sitting on the side of a flat bed without using bedrails?: A Little Help needed moving to and from a bed to a chair (including a wheelchair)?: A Little Help needed standing up from a chair using your arms (e.g., wheelchair or bedside chair)?: A Little Help needed to walk in hospital room?: A Little Help needed climbing 3-5 steps with a railing? : A Little 6 Click Score: 19    End of Session   Activity Tolerance: Patient limited by fatigue Patient left: with call bell/phone within reach;in bed;with bed alarm set Nurse Communication: Mobility status PT Visit Diagnosis: Unsteadiness on feet (R26.81);Other abnormalities of gait and mobility (R26.89);Repeated falls (R29.6);Muscle weakness (generalized) (M62.81);History of falling (Z91.81);Difficulty in walking, not elsewhere classified (R26.2)     Time: 1133-1202 PT Time Calculation (min) (ACUTE ONLY): 29 min  Charges:  $Therapeutic Exercise: 8-22 mins $Therapeutic Activity: 8-22 mins                     Stacie Glaze, PT DPT Acute Rehabilitation Services Pager [phone]  Office [phone]    [place] E Ruffin Pyo 02/27/2021, 2:00 PM

## 2021-02-27 NOTE — Progress Notes (Addendum)
PROGRESS NOTE    Ashley [name]  JIR:[id number] DOB: [birth year] DOA: 02/24/2021 PCP: Unk Pinto, MD    Brief Narrative:  Mrs. Ashley [name] was admitted to the hospital with the working diagnosis of acute on chronic diastolic heart failure decompensation.   75 year old female past medical history for hypertension, COPD, type 2 diabetes mellitus, heart failure DVT, chronic kidney disease stage V, iron deficiency anemia, history of CVA, ambulatory dysfunction, anxiety/bipolar disorder and lung cancer in remission who presented with dyspnea for the last 2 to 3 weeks.  Persistent and progressive dyspnea, associated with orthopnea, wheezing and lower extremity edema.  10 pound weight gain over the last 2 weeks.  Patient has been depressed and not taking her medicines.  On her initial physical examination her blood pressure was 210/100, heart rate 90, respiratory rate 22, oxygen saturation 99% on room air, lungs decreased air movement, no rhonchi or rales, heart S1-S2, present, rhythmic, abdomen soft, positive bilateral lower extremity edema.  Sodium 134, potassium 3.7, chloride 101, bicarb 21, glucose 207, BUN 64, creatinine 4.15 BNP 2213, high sensitive troponin 92-92, white count 6.0, hemoglobin 9.7, hematocrit 30.3, platelets 298 SARS COVID-19 negative.  Urinalysis specific gravity 1.008, 6-10 white cells, 0-5 red cells, 100 protein, > 500 glucose.  Chest radiograph, bilateral hilar vascular congestion.  EKG 83 bpm, normal axis, normal intervals, sinus rhythm, Q-wave V1-V3, no significant ST segment or T wave changes.  Patient was placed on furosemide for diuresis along with metolazone.  Assessment & Plan:   Principal Problem:   Acute CHF (congestive heart failure) (HCC) Active Problems:   Major depressive disorder, recurrent episode (HCC)   COPD   Abnormality of gait   Hypothyroidism   Memory difficulty   Hyperlipidemia associated with type 2 diabetes mellitus (HCC)   Hypertensive  urgency   Elevated troponin   CKD (chronic kidney disease) stage 5, GFR less than 15 ml/min (HCC)   DNR (do not resuscitate)   Acute decompensation of chronic diastolic heart failure/ HTN. Edema and dyspnea are improving, but not yet back to baseline, her urine output is 2000 ml over last 24 hrs and blood pressure systolic is 751 mmHg.   Plan to continue diuresis with furosemide 80 mg Iv q 8 hrs to target further negative fluid balance. Continue blood pressure control with hydralazine and amlodipine,. Consult Nutrition for heart failure teaching.   2. AKI on CKD stage V. Improved volume status and good urine output. K is 3,2 and serum bicarbonate at 22 with BUN 81. Stable serum cr at 4,69   Plan to continue diuresis with furosemide.  Patient for now would like to continue medical care.  Anemia of chronic renal disease, continue with Aranesp/ iron supplementation.   3. T2DM with dyslipidemia. Fasting glucose is 105, continue insulin sliding scalae for glucose cover and monitoring. Patient with poor oral intake.   Continue with atorvastatin and fenofibrate.    4. COPD/. No acute exacerbation, continue with bronchodilator therapy.   5. Hypothyroid. Continue with levothyroxine.   6. Depression. Continue with clorazepate, fluoxetine.   Status is: Inpatient  Remains inpatient appropriate because:Inpatient level of care appropriate due to severity of illness  Dispo: The patient is from: Home              Anticipated d/c is to: Home              Patient currently is not medically stable to d/c.   Difficult to place patient No   DVT prophylaxis: Heparin  Code Status:   DNR   Family Communication:  I spoke over the phone with the patient's husband about patient's  condition, plan of care, prognosis and all questions were addressed.    Consultants:  Nephrology   Subjective: Patient is feeling better, but not yet back to baseline, no nausea or vomiting, no chest pain.    Objective: Vitals:   02/27/21 0300 02/27/21 0330 02/27/21 1000 02/27/21 1134  BP:  (!) 154/57 (!) 152/63 (!) 176/81  Pulse:  83  85  Resp:  18  18  Temp:  98.5 F (36.9 C)  98.1 F (36.7 C)  TempSrc:  Oral  Oral  SpO2: 99% 100%  97%  Weight:  70.3 kg    Height:        Intake/Output Summary (Last 24 hours) at 02/27/2021 1422 Last data filed at 02/27/2021 1300 Gross per 24 hour  Intake 1058 ml  Output 2000 ml  Net -942 ml   Filed Weights   02/25/21 0313 02/26/21 0541 02/27/21 0330  Weight: 72.8 kg 74.8 kg 70.3 kg    Examination:   General: Not in pain or dyspnea, deconditioned  Neurology: Awake and alert, non focal  E ENT: no pallor, no icterus, oral mucosa moist Cardiovascular: No JVD. S1-S2 present, rhythmic, no gallops, rubs, or murmurs. + bilateral lower extremity edema. Pulmonary: positive breath sounds bilaterally, adequate air movement, no wheezing, rhonchi, scattered rales bilaterally on anterior auscultation  Gastrointestinal. Abdomen soft and non tender Skin. No rashes Musculoskeletal: no joint deformities   Data Reviewed: I have personally reviewed following labs and imaging studies  CBC: Recent Labs  Lab 02/24/21 0210 02/24/21 0316 02/26/21 0151 02/27/21 0502  WBC 6.0  --  4.7 4.9  NEUTROABS 4.8  --  3.4 3.4  HGB 9.7* 9.5* 8.5* 7.9*  HCT 30.3* 28.0* 26.4* 24.3*  MCV 94.1  --  91.3 91.4  PLT 298  --  268 338   Basic Metabolic Panel: Recent Labs  Lab 02/24/21 0210 02/24/21 0316 02/25/21 0501 02/26/21 0151 02/27/21 0502  NA 134* 135 133* 136 134*  K 3.7 3.6 3.6 3.5 3.2*  CL 101  --  98 99 99  CO2 21*  --  21* 22 22  GLUCOSE 207*  --  132* 141* 105*  BUN 64*  --  70* 76* 81*  CREATININE 4.15*  --  4.47* 4.69* 4.69*  CALCIUM 9.0  --  9.2 9.6 9.1  MG  --   --   --  2.6* 2.5*  PHOS  --   --   --  6.0* 6.6*   GFR: Estimated Creatinine Clearance: 10.1 mL/min (A) (by C-G formula based on SCr of 4.69 mg/dL (H)). Liver Function  Tests: Recent Labs  Lab 02/24/21 0210 02/26/21 0151 02/27/21 0502  AST 16 15 13*  ALT 12 16 14   ALKPHOS 24* 23* 23*  BILITOT 0.8 0.7 0.4  PROT 6.9 6.5 5.9*  ALBUMIN 3.3* 3.1* 3.0*   No results for input(s): LIPASE, AMYLASE in the last 168 hours. No results for input(s): AMMONIA in the last 168 hours. Coagulation Profile: No results for input(s): INR, PROTIME in the last 168 hours. Cardiac Enzymes: No results for input(s): CKTOTAL, CKMB, CKMBINDEX, TROPONINI in the last 168 hours. BNP (last 3 results) No results for input(s): PROBNP in the last 8760 hours. HbA1C: No results for input(s): HGBA1C in the last 72 hours. CBG: Recent Labs  Lab 02/26/21 1130 02/26/21 1617 02/26/21 2117 02/27/21 0606 02/27/21 1131  GLUCAP 237* 107* 184* 102* 173*   Lipid Profile: No results for input(s): CHOL, HDL, LDLCALC, TRIG, CHOLHDL, LDLDIRECT in the last 72 hours. Thyroid Function Tests: Recent Labs    02/24/21 1610  TSH 1.618   Anemia Panel: Recent Labs    02/27/21 0847  FERRITIN 189  TIBC 298  IRON 68      Radiology Studies: I have reviewed all of the imaging during this hospital visit personally     Scheduled Meds:  amLODipine  10 mg Oral Daily   atorvastatin  40 mg Oral Daily   darbepoetin (ARANESP) injection - NON-DIALYSIS  40 mcg Subcutaneous Q Wed-1800   famotidine  20 mg Oral Daily   fenofibrate  160 mg Oral Daily   ferrous sulfate  325 mg Oral BID WC   FLUoxetine  20 mg Oral Daily   furosemide  80 mg Intravenous Q8H   heparin  5,000 Units Subcutaneous Q8H   hydrALAZINE  100 mg Oral Q8H   insulin aspart  0-15 Units Subcutaneous TID WC   levothyroxine  88 mcg Oral QAC breakfast   sodium chloride flush  3 mL Intravenous Q12H   Continuous Infusions:  sodium chloride       LOS: 3 days        Taegen Lennox Gerome Apley, MD

## 2021-02-27 NOTE — Progress Notes (Signed)
Mobility Specialist Criteria Algorithm Info.  Mobility Team: HOB elevated: Activity: Ambulated in room; Dangled on edge of bed Range of motion: Active; All extremities Level of assistance: Standby assist, set-up cues, supervision of patient - no hands on Assistive device: Front wheel walker Minutes sitting in chair:  Minutes stood: 3 minutes Minutes ambulated: 3 minutes Distance ambulated (ft): 30 ft Mobility response: Tolerated well Bed Position: Semi-fowlers  Patient agreed and tolerated ambulation well today without complaint or incident. Returned to bed with all needs met   02/27/2021 10:00 AM

## 2021-02-27 NOTE — Progress Notes (Signed)
[facility] Progress Note   Subjective:   UOP 2011mL overnight with furosemide 80 IV TID increased yesterday and metolazone 5mg .  Remains on RA/2L [place] with 100% sat.   Breathing improving but still with orthopnea.    Objective Vitals:   02/27/21 0100 02/27/21 0200 02/27/21 0300 02/27/21 0330  BP:    (!) 154/57  Pulse:    83  Resp:    18  Temp:    98.5 F (36.9 C)  TempSrc:    Oral  SpO2: 100% 100% 99% 100%  Weight:    70.3 kg  Height:       Physical Exam General: chronically ill and anxious - not far from baseline  Heart:RRR, no rub Lungs: rales in bases L in particular Abdomen:soft Extremities:1+ LLE edema,R trace edema, improving   Additional Objective Labs: Basic Metabolic Panel: Recent Labs  Lab 02/25/21 0501 02/26/21 0151 02/27/21 0502  NA 133* 136 134*  K 3.6 3.5 3.2*  CL 98 99 99  CO2 21* 22 22  GLUCOSE 132* 141* 105*  BUN 70* 76* 81*  CREATININE 4.47* 4.69* 4.69*  CALCIUM 9.2 9.6 9.1  PHOS  --  6.0* 6.6*    Liver Function Tests: Recent Labs  Lab 02/24/21 0210 02/26/21 0151 02/27/21 0502  AST 16 15 13*  ALT 12 16 14   ALKPHOS 24* 23* 23*  BILITOT 0.8 0.7 0.4  PROT 6.9 6.5 5.9*  ALBUMIN 3.3* 3.1* 3.0*    No results for input(s): LIPASE, AMYLASE in the last 168 hours. CBC: Recent Labs  Lab 02/24/21 0210 02/24/21 0316 02/26/21 0151 02/27/21 0502  WBC 6.0  --  4.7 4.9  NEUTROABS 4.8  --  3.4 3.4  HGB 9.7* 9.5* 8.5* 7.9*  HCT 30.3* 28.0* 26.4* 24.3*  MCV 94.1  --  91.3 91.4  PLT 298  --  268 269    Blood Culture    Component Value Date/Time   SDES  11/07/2020 1255    Urine Performed at [facility], [place] [address], [place], [place] [address]    SPECREQUEST  11/07/2020 1255    NONE Performed at [facility], [place] [address] [place],  [address]    CULT >=100,000 COLONIES/mL KLEBSIELLA PNEUMONIAE (A) 11/07/2020 1255   REPTSTATUS 11/10/2020 FINAL 11/07/2020 1255     Cardiac Enzymes: No results for input(s): CKTOTAL, CKMB, CKMBINDEX, TROPONINI in the last 168 hours. CBG: Recent Labs  Lab 02/26/21 0551 02/26/21 1130 02/26/21 1617 02/26/21 2117 02/27/21 0606  GLUCAP 109* 237* 107* 184* 102*    Iron Studies: No results for input(s): IRON, TIBC, TRANSFERRIN, FERRITIN in the last 72 hours. @lablastinr3 @ Studies/Results: No results found. Medications:  sodium chloride      amLODipine  10 mg Oral Daily   atorvastatin  40 mg Oral Daily   famotidine  20 mg Oral Daily   fenofibrate  160 mg Oral Daily   ferrous sulfate  325 mg Oral BID WC   FLUoxetine  20 mg Oral Daily   furosemide  80 mg Intravenous Q8H   heparin  5,000 Units Subcutaneous Q8H   hydrALAZINE  100 mg Oral Q8H   insulin aspart  0-15 Units Subcutaneous TID WC   levothyroxine  88 mcg Oral QAC breakfast   sodium chloride flush  3 mL Intravenous Q12H    Assessment/Recommendations:    CKD5, stable: Cr seems to be around 4. Underlying etiology of CKD likely secondary to DM and HTN -kidney function around baseline  and no uremic symptoms but she is very close to needing to start renal replacement therapy.    -Will continue to manage medically and readdress daily course re: dialysis.  She feels conflicted re: dialysis but is clear she's not ready to do hospice/comfort care should she reach ESRD.  Really appreciate palliative care helping in this discussion.  She has a lot of personal/life stressors that are impacting her as well. Discussed with brother as well on 7/12 bedside, long discussion -Continue to monitor daily Cr, Dose meds for GFR<15 -Monitor Daily I/Os, Daily weight -Maintain MAP>65 for optimal renal perfusion. -Agree with holding ACE-I, avoid further nephrotoxins including NSAIDS, Morphine.  Unless absolutely necessary, avoid CT with contrast and/or MRI with gadolinium.        Acute on chronic dCHF exacerbation: presented with symptoms of orthopnea and dyspnea, wt gain.   - Good diuresis overnight but still with volume, lasix 80 TID with metolazone for now -Seems main issue was skipping meds and dietary indiscretion that led to the presentation   Hypertension: -diuresis and home meds   Anemia due to chronic kidney disease: -Transfuse for Hgb<7 g/dL -checking iron panel, in 10/2020 was ok -start ESA   Diabetes Mellitus Type 2  -per primary service; last A1c 01/15/21 was 6.3  Jannifer Hick MD 02/27/2021, 9:25 AM  [facility] Pager: [phone]

## 2021-02-28 ENCOUNTER — Other Ambulatory Visit: Payer: Self-pay | Admitting: Internal Medicine

## 2021-02-28 DIAGNOSIS — R778 Other specified abnormalities of plasma proteins: Secondary | ICD-10-CM | POA: Diagnosis not present

## 2021-02-28 DIAGNOSIS — Z66 Do not resuscitate: Secondary | ICD-10-CM | POA: Diagnosis not present

## 2021-02-28 DIAGNOSIS — D649 Anemia, unspecified: Secondary | ICD-10-CM

## 2021-02-28 DIAGNOSIS — N185 Chronic kidney disease, stage 5: Secondary | ICD-10-CM | POA: Diagnosis not present

## 2021-02-28 DIAGNOSIS — I5031 Acute diastolic (congestive) heart failure: Secondary | ICD-10-CM | POA: Diagnosis not present

## 2021-02-28 LAB — COMPREHENSIVE METABOLIC PANEL
ALT: 16 U/L (ref 0–44)
AST: 17 U/L (ref 15–41)
Albumin: 3.4 g/dL — ABNORMAL LOW (ref 3.5–5.0)
Alkaline Phosphatase: 25 U/L — ABNORMAL LOW (ref 38–126)
Anion gap: 14 (ref 5–15)
BUN: 90 mg/dL — ABNORMAL HIGH (ref 8–23)
CO2: 24 mmol/L (ref 22–32)
Calcium: 9.4 mg/dL (ref 8.9–10.3)
Chloride: 95 mmol/L — ABNORMAL LOW (ref 98–111)
Creatinine, Ser: 5.33 mg/dL — ABNORMAL HIGH (ref 0.44–1.00)
GFR, Estimated: 8 mL/min — ABNORMAL LOW (ref >60)
Glucose, Bld: 102 mg/dL — ABNORMAL HIGH (ref 70–99)
Potassium: 3.4 mmol/L — ABNORMAL LOW (ref 3.5–5.1)
Sodium: 133 mmol/L — ABNORMAL LOW (ref 135–145)
Total Bilirubin: 0.5 mg/dL (ref 0.3–1.2)
Total Protein: 6.6 g/dL (ref 6.5–8.1)

## 2021-02-28 LAB — GLUCOSE, CAPILLARY
Glucose-Capillary: 102 mg/dL — ABNORMAL HIGH (ref 70–99)
Glucose-Capillary: 148 mg/dL — ABNORMAL HIGH (ref 70–99)
Glucose-Capillary: 190 mg/dL — ABNORMAL HIGH (ref 70–99)
Glucose-Capillary: 122 mg/dL — ABNORMAL HIGH (ref 70–99)

## 2021-02-28 LAB — MAGNESIUM: Magnesium: 2.7 mg/dL — ABNORMAL HIGH (ref 1.7–2.4)

## 2021-02-28 NOTE — Progress Notes (Signed)
Occupational Therapy Treatment Patient Details Name: Ashley [name] MRN: [id number] DOB: 11/27/1945 Today's Date: 02/28/2021    History of present illness 75 y/o female preesenting on 7/10 for shortrness of breath over last 2-3 weeks. CXR showed increasing hazy and patchy opacities in the lower lungs concerning for atelectasis and edema. PMH: HTN, COPD, DM type II, diastolic CHF last EF 60 -63% with grade 1 diastolic dysfunction, DVT no longer on Coumadin, CKD stage 5, CVA, iron deficiency anemia, gait disturbance, anxiety/bipolar disorder, lung cancer in remission, valvular cancer in remission, and GERD.   OT comments  Pt progressing gradually towards OT goals, remains limited by poor endurance requiring frequent rest breaks with short mobility. Pt able to mobilize on a min guard basis using RW though requiring Min A to correct one posterior LOB while ambulating. Educated on energy conservation strategies (handout provided) and gradual progression of endurance as tolerated. Continue to recommend [place] follow-up at DC though noted pt declines these services. VSS on RA.    Follow Up Recommendations  Home health OT    Equipment Recommendations  Other (comment) (Rolling walker)    Recommendations for Other Services      Precautions / Restrictions Precautions Precautions: Fall Restrictions Weight Bearing Restrictions: No       Mobility Bed Mobility Overal bed mobility: Needs Assistance Bed Mobility: Supine to Sit;Sit to Supine     Supine to sit: Min assist Sit to supine: Min guard   General bed mobility comments: Min A to lift trunk, min guard to get B LE back into bed    Transfers Overall transfer level: Needs assistance Equipment used: Rolling walker (2 wheeled) Transfers: Sit to/from Stand Sit to Stand: Min guard         General transfer comment: min guard for steadying, requires second attempt to sustain standing    Balance Overall balance assessment: Needs  assistance;History of Falls Sitting-balance support: Feet supported Sitting balance-Leahy Scale: Fair     Standing balance support: During functional activity;Bilateral upper extremity supported Standing balance-Leahy Scale: Poor Standing balance comment: reliant on UE support + external support to correct LOB                           ADL either performed or assessed with clinical judgement   ADL Overall ADL's : Needs assistance/impaired                                       General ADL Comments: Pt used purewick during session. Collaborated on strategies for urinary frequency, discussed home setup and DME needs. Educated on energy conservation with handout provided     Vision   Vision Assessment?: No apparent visual deficits   Perception     Praxis      Cognition Arousal/Alertness: Awake/alert Behavior During Therapy: WFL for tasks assessed/performed Overall Cognitive Status: History of cognitive impairments - at baseline                                 General Comments: per chart review, pt with baseline memory deficits. Requires cues to stay on task, tangential in conversation.        Exercises     Shoulder Instructions       General Comments VSS on RA, requests O2 on at end of session.  husband present and hands on to assist    Pertinent Vitals/ Pain       Pain Assessment: No/denies pain  Home Living                                          Prior Functioning/Environment              Frequency  Min 2X/week        Progress Toward Goals  OT Goals(current goals can now be found in the care plan section)  Progress towards OT goals: Progressing toward goals  Acute Rehab OT Goals Patient Stated Goal: Improve independence OT Goal Formulation: With patient Time For Goal Achievement: 03/11/21 Potential to Achieve Goals: Good ADL Goals Pt Will Perform Lower Body Bathing: with set-up;sit  to/from stand;sitting/lateral leans Pt Will Perform Lower Body Dressing: with set-up;sit to/from stand;sitting/lateral leans Pt Will Transfer to Toilet: with set-up;ambulating Pt/caregiver will Perform Home Exercise Program: Increased strength;Both right and left upper extremity;With theraband;With written HEP provided;With Supervision Additional ADL Goal #1: Pt to verbalize at least 2 fall prevention strategies to implement in home environment  Plan Discharge plan remains appropriate    Co-evaluation                 AM-PAC OT "6 Clicks" Daily Activity     Outcome Measure   Help from another person eating meals?: None Help from another person taking care of personal grooming?: A Little Help from another person toileting, which includes using toliet, bedpan, or urinal?: A Little Help from another person bathing (including washing, rinsing, drying)?: A Lot Help from another person to put on and taking off regular upper body clothing?: A Little Help from another person to put on and taking off regular lower body clothing?: A Lot 6 Click Score: 17    End of Session Equipment Utilized During Treatment: Gait belt;Rolling walker;Oxygen  OT Visit Diagnosis: Unsteadiness on feet (R26.81);Other abnormalities of gait and mobility (R26.89);Muscle weakness (generalized) (M62.81);Other symptoms and signs involving cognitive function   Activity Tolerance Patient tolerated treatment well   Patient Left in bed;with call bell/phone within reach;with bed alarm set   Nurse Communication Other (comment) (NT - purewick container full)        Time: 1343-1410 OT Time Calculation (min): 27 min  Charges: OT General Charges $OT Visit: 1 Visit OT Treatments $Self Care/Home Management : 8-22 mins $Therapeutic Activity: 8-22 mins  Malachy Chamber, OTR/L Acute Rehab Services Office: [phone]    Layla Maw 02/28/2021, 2:22 PM

## 2021-02-28 NOTE — Progress Notes (Signed)
[facility] Progress Note   Subjective:   UOP 1964mL yesterday.  Improving orthopnea but still present.  Difficult to tell if dysgeusia or just doesn't like food choices.   Objective Vitals:   02/27/21 1900 02/27/21 2000 02/27/21 2100 02/28/21 0336  BP:  (!) 134/56  (!) 164/73  Pulse:  87  81  Resp:  19  19  Temp:  98.7 F (37.1 C)  97.6 F (36.4 C)  TempSrc:  Oral  Oral  SpO2: 100% 99% 99% 97%  Weight:    68.6 kg  Height:       Physical Exam General: chronically ill and anxious - not far from baseline  Heart:RRR, no rub Lungs: rales in bases L in particular Abdomen:soft Extremities:1+ LLE edema,R trace edema, improving but still present   Additional Objective Labs: Basic Metabolic Panel: Recent Labs  Lab 02/26/21 0151 02/27/21 0502 02/28/21 0359  NA 136 134* 133*  K 3.5 3.2* 3.4*  CL 99 99 95*  CO2 22 22 24   GLUCOSE 141* 105* 102*  BUN 76* 81* 90*  CREATININE 4.69* 4.69* 5.33*  CALCIUM 9.6 9.1 9.4  PHOS 6.0* 6.6*  --     Liver Function Tests: Recent Labs  Lab 02/26/21 0151 02/27/21 0502 02/28/21 0359  AST 15 13* 17  ALT 16 14 16   ALKPHOS 23* 23* 25*  BILITOT 0.7 0.4 0.5  PROT 6.5 5.9* 6.6  ALBUMIN 3.1* 3.0* 3.4*    No results for input(s): LIPASE, AMYLASE in the last 168 hours. CBC: Recent Labs  Lab 02/24/21 0210 02/24/21 0316 02/26/21 0151 02/27/21 0502  WBC 6.0  --  4.7 4.9  NEUTROABS 4.8  --  3.4 3.4  HGB 9.7* 9.5* 8.5* 7.9*  HCT 30.3* 28.0* 26.4* 24.3*  MCV 94.1  --  91.3 91.4  PLT 298  --  268 269    Blood Culture    Component Value Date/Time   SDES  11/07/2020 1255    Urine Performed at [facility], [place] [address], [place], [place] [address]    SPECREQUEST  11/07/2020 1255    NONE Performed at [facility], [place] [address], [place], [place] [address]    CULT >=100,000 COLONIES/mL KLEBSIELLA PNEUMONIAE (A) 11/07/2020 1255   REPTSTATUS 11/10/2020 FINAL 11/07/2020 1255     Cardiac Enzymes: No results for input(s): CKTOTAL, CKMB, CKMBINDEX, TROPONINI in the last 168 hours. CBG: Recent Labs  Lab 02/27/21 0606 02/27/21 1131 02/27/21 1623 02/27/21 2114 02/28/21 0606  GLUCAP 102* 173* 117* 154* 102*    Iron Studies:  Recent Labs    02/27/21 0847  IRON 68  TIBC 298  FERRITIN 189   @lablastinr3 @ Studies/Results: No results found. Medications:  sodium chloride      amLODipine  10 mg Oral Daily   atorvastatin  40 mg Oral Daily   darbepoetin (ARANESP) injection - NON-DIALYSIS  40 mcg Subcutaneous Q Wed-1800   famotidine  20 mg Oral Daily   fenofibrate  160 mg Oral Daily   ferrous sulfate  325 mg Oral BID WC   FLUoxetine  20 mg Oral Daily   furosemide  80 mg Intravenous Q8H   heparin  5,000 Units Subcutaneous Q8H   hydrALAZINE  100 mg Oral Q8H   insulin aspart  0-15 Units Subcutaneous TID WC   levothyroxine  88 mcg Oral QAC breakfast   sodium chloride flush  3 mL Intravenous Q12H    Assessment/Recommendations:    CKD5, stable: Cr seems to  be around 4. Underlying etiology of CKD likely secondary to DM and HTN -Worsening renal function in face of obligatory diuresis.   -Recommended initiation of dialysis as I suspect she will continue to progress from here.  She continues to be very conflicted re: options.  We have agreed to meet tomorrow again with brother present to discuss options: dialysis now, hospice OR go home to see if she can continue on without dialysis. I think think last option is risky and I have recommended against it.  She certainly has a lot of difficulties with this decision making process but clearly says NO to both dialysis and hospice at this moment.  -Continue to monitor daily Cr, Dose meds for GFR<15 -Monitor Daily I/Os, Daily weight -Maintain MAP>65 for optimal renal perfusion. -Agree with holding ACE-I, avoid further nephrotoxins including NSAIDS, Morphine.  Unless absolutely necessary, avoid CT with contrast and/or  MRI with gadolinium.        Acute on chronic dCHF exacerbation: presented with symptoms of orthopnea and dyspnea, wt gain.  - Good diuresis overnight but still with volume, lasix 80 TID with metolazone for now - still with volume, continue this dose -Seems main issue was skipping meds and dietary indiscretion that led to the presentation   Hypertension: -diuresis and home meds   Anemia due to chronic kidney disease: -Transfuse for Hgb<7 g/dL -checking iron panel, in 10/2020 was ok -start ESA   Diabetes Mellitus Type 2  -per primary service; last A1c 01/15/21 was 6.3  Jannifer Hick MD 02/28/2021, 7:54 AM  [facility] Pager: [phone]

## 2021-02-28 NOTE — Progress Notes (Signed)
This chaplain is bedside to offer the Pt. a reflective listening presence as the Pt. acknowledges and prepares to make a decision about dialysis on Friday.    The chaplain understands through story telling the Pt lifestyle has reflected her own choices for many years. The choice the Pt. is asking herself is will she maintain the schedule of dialysis or will she accept Hospice care and face the choices of end of life?  The Pt. faith continues to inform this period of discernment along with her brother-Larry and husband-George.  The chaplain explored forgiveness and trust in the context of the Pt. faith.    The Pt. is appreciative of the visit. The Pt. accepted the chaplain's invitation for prayer and F/U spiritual care.

## 2021-02-28 NOTE — Care Management Important Message (Signed)
Important Message  Patient Details  Name: Ashley [name] MRN: [id number] Date of Birth: 1946/07/30   Medicare Important Message Given:  Yes     Shelda Altes 02/28/2021, 7:37 AM

## 2021-02-28 NOTE — Progress Notes (Signed)
PROGRESS NOTE    Ashley [name]  WPY:[id number] DOB: 12/18/1945 DOA: 02/24/2021 PCP: Unk Pinto, MD    Brief Narrative:  Ashley [name] was admitted to the hospital with the working diagnosis of acute on chronic diastolic heart failure decompensation.    75 year old female past medical history for hypertension, COPD, type 2 diabetes mellitus, heart failure DVT, chronic kidney disease stage V, iron deficiency anemia, history of CVA, ambulatory dysfunction, anxiety/bipolar disorder and lung cancer in remission who presented with dyspnea for the last 2 to 3 weeks.  Persistent and progressive dyspnea, associated with orthopnea, wheezing and lower extremity edema.  10 pound weight gain over the last 2 weeks.  Patient has been depressed and not taking her medicines.  On her initial physical examination her blood pressure was 210/100, heart rate 90, respiratory rate 22, oxygen saturation 99% on room air, lungs decreased air movement, no rhonchi or rales, heart S1-S2, present, rhythmic, abdomen soft, positive bilateral lower extremity edema.   Sodium 134, potassium 3.7, chloride 101, bicarb 21, glucose 207, BUN 64, creatinine 4.15 BNP 2213, high sensitive troponin 92-92, white count 6.0, hemoglobin 9.7, hematocrit 30.3, platelets 298 SARS COVID-19 negative.   Urinalysis specific gravity 1.008, 6-10 white cells, 0-5 red cells, 100 protein, > 500 glucose.   Chest radiograph, bilateral hilar vascular congestion.   EKG 83 bpm, normal axis, normal intervals, sinus rhythm, Q-wave V1-V3, no significant ST segment or T wave changes.   Patient was placed on furosemide for diuresis along with metolazone.   Patient with worsening renal function, imminent need for renal replacement therapy.  Pending family meeting with nephrology tomorrow   Assessment & Plan:   Principal Problem:   Acute CHF (congestive heart failure) ([place]) Active Problems:   Major depressive disorder, recurrent episode ([place])   COPD    Abnormality of gait   Hypothyroidism   Memory difficulty   Hyperlipidemia associated with type 2 diabetes mellitus ([place])   Hypertensive urgency   Elevated troponin   CKD (chronic kidney disease) stage 5, GFR less than 15 ml/min ([facility])   DNR (do not resuscitate)    Acute decompensation of chronic diastolic heart failure/ HTN.  Hypervolemia continue to improve, her urine output is 1,900 over last 24 hrs.    Continue diuresis with furosemide 80 mg Iv q 8 hrs Close blood pressure monitoring.  Continue with hydralazine and amlodipine for blood pressure control.    2. AKI on CKD stage V. Renal function with serum cr up to 5,33 with K is 3,4 and serum bicarbonate at 24 with BUN 90.    Patient with high risk of worsening renal failure, plan for family meeting tomorrow with nephrology in regards of further renal replacement therapy.    3. T2DM with dyslipidemia. On insulin sliding scalae for glucose cover and monitoring. Fasting glucose is 102 mg/dl  On atorvastatin and fenofibrate.     4. COPD/. On bronchodilator therapy.   5. Hypothyroid. On levothyroxine.   6. Depression. On clorazepate, fluoxetine.    Patient continue to be at high risk for worsening renal function   Status is: Inpatient  Remains inpatient appropriate because:Inpatient level of care appropriate due to severity of illness  Dispo: The patient is from: Home              Anticipated d/c is to: Home              Patient currently is not medically stable to d/c.   Difficult to place patient No  DVT prophylaxis: Heparin   Code Status:   Dnr   Family Communication:  No family at the bedside      Consultants:  Nephrology    Subjective: Patient continue to have lower extremity edema but dyspnea is improving, no chest pain, no nausea or vomiting,   Objective: Vitals:   02/27/21 2000 02/27/21 2100 02/28/21 0336 02/28/21 1212  BP: (!) 134/56  (!) 164/73 (!) 168/71  Pulse: 87  81 82  Resp: 19  19 16    Temp: 98.7 F (37.1 C)  97.6 F (36.4 C) 97.6 F (36.4 C)  TempSrc: Oral  Oral Oral  SpO2: 99% 99% 97% 100%  Weight:   68.6 kg   Height:        Intake/Output Summary (Last 24 hours) at 02/28/2021 1722 Last data filed at 02/28/2021 1420 Gross per 24 hour  Intake 570 ml  Output 2350 ml  Net -1780 ml   Filed Weights   02/26/21 0541 02/27/21 0330 02/28/21 0336  Weight: 74.8 kg 70.3 kg 68.6 kg    Examination:   General: Not in pain or dyspnea, deconditioned  Neurology: Awake and alert, non focal  E ENT: no pallor, no icterus, oral mucosa moist Cardiovascular: No JVD. S1-S2 present, rhythmic, no gallops, rubs, or murmurs. +/++ bilateral lower extremity edema. Pulmonary: positive breath sounds bilaterally, adequate air movement, no wheezing, rhonchi, positive rales at bases. Gastrointestinal. Abdomen soft and non tender Skin. No rashes Musculoskeletal: no joint deformities     Data Reviewed: I have personally reviewed following labs and imaging studies  CBC: Recent Labs  Lab 02/24/21 0210 02/24/21 0316 02/26/21 0151 02/27/21 0502  WBC 6.0  --  4.7 4.9  NEUTROABS 4.8  --  3.4 3.4  HGB 9.7* 9.5* 8.5* 7.9*  HCT 30.3* 28.0* 26.4* 24.3*  MCV 94.1  --  91.3 91.4  PLT 298  --  268 782   Basic Metabolic Panel: Recent Labs  Lab 02/24/21 0210 02/24/21 0316 02/25/21 0501 02/26/21 0151 02/27/21 0502 02/28/21 0359  NA 134* 135 133* 136 134* 133*  K 3.7 3.6 3.6 3.5 3.2* 3.4*  CL 101  --  98 99 99 95*  CO2 21*  --  21* 22 22 24   GLUCOSE 207*  --  132* 141* 105* 102*  BUN 64*  --  70* 76* 81* 90*  CREATININE 4.15*  --  4.47* 4.69* 4.69* 5.33*  CALCIUM 9.0  --  9.2 9.6 9.1 9.4  MG  --   --   --  2.6* 2.5* 2.7*  PHOS  --   --   --  6.0* 6.6*  --    GFR: Estimated Creatinine Clearance: 8.7 mL/min (A) (by C-G formula based on SCr of 5.33 mg/dL (H)). Liver Function Tests: Recent Labs  Lab 02/24/21 0210 02/26/21 0151 02/27/21 0502 02/28/21 0359  AST 16 15 13* 17   ALT 12 16 14 16   ALKPHOS 24* 23* 23* 25*  BILITOT 0.8 0.7 0.4 0.5  PROT 6.9 6.5 5.9* 6.6  ALBUMIN 3.3* 3.1* 3.0* 3.4*   No results for input(s): LIPASE, AMYLASE in the last 168 hours. No results for input(s): AMMONIA in the last 168 hours. Coagulation Profile: No results for input(s): INR, PROTIME in the last 168 hours. Cardiac Enzymes: No results for input(s): CKTOTAL, CKMB, CKMBINDEX, TROPONINI in the last 168 hours. BNP (last 3 results) No results for input(s): PROBNP in the last 8760 hours. HbA1C: No results for input(s): HGBA1C in the last 72 hours.  CBG: Recent Labs  Lab 02/27/21 1131 02/27/21 1623 02/27/21 2114 02/28/21 0606 02/28/21 1209  GLUCAP 173* 117* 154* 102* 148*   Lipid Profile: No results for input(s): CHOL, HDL, LDLCALC, TRIG, CHOLHDL, LDLDIRECT in the last 72 hours. Thyroid Function Tests: No results for input(s): TSH, T4TOTAL, FREET4, T3FREE, THYROIDAB in the last 72 hours. Anemia Panel: Recent Labs    02/27/21 0847  FERRITIN 189  TIBC 298  IRON 68      Radiology Studies: I have reviewed all of the imaging during this hospital visit personally     Scheduled Meds:  amLODipine  10 mg Oral Daily   atorvastatin  40 mg Oral Daily   darbepoetin (ARANESP) injection - NON-DIALYSIS  40 mcg Subcutaneous Q Wed-1800   famotidine  20 mg Oral Daily   fenofibrate  160 mg Oral Daily   ferrous sulfate  325 mg Oral BID WC   FLUoxetine  20 mg Oral Daily   furosemide  80 mg Intravenous Q8H   heparin  5,000 Units Subcutaneous Q8H   hydrALAZINE  100 mg Oral Q8H   insulin aspart  0-15 Units Subcutaneous TID WC   levothyroxine  88 mcg Oral QAC breakfast   sodium chloride flush  3 mL Intravenous Q12H   Continuous Infusions:  sodium chloride       LOS: 4 days        Cathren Sween Gerome Apley, MD

## 2021-03-01 DIAGNOSIS — N185 Chronic kidney disease, stage 5: Secondary | ICD-10-CM | POA: Diagnosis not present

## 2021-03-01 DIAGNOSIS — I160 Hypertensive urgency: Secondary | ICD-10-CM

## 2021-03-01 DIAGNOSIS — E038 Other specified hypothyroidism: Secondary | ICD-10-CM

## 2021-03-01 DIAGNOSIS — Z515 Encounter for palliative care: Secondary | ICD-10-CM | POA: Diagnosis not present

## 2021-03-01 DIAGNOSIS — E785 Hyperlipidemia, unspecified: Secondary | ICD-10-CM

## 2021-03-01 DIAGNOSIS — Z7189 Other specified counseling: Secondary | ICD-10-CM | POA: Diagnosis not present

## 2021-03-01 DIAGNOSIS — E1169 Type 2 diabetes mellitus with other specified complication: Secondary | ICD-10-CM | POA: Diagnosis not present

## 2021-03-01 DIAGNOSIS — I5031 Acute diastolic (congestive) heart failure: Secondary | ICD-10-CM | POA: Diagnosis not present

## 2021-03-01 LAB — COMPREHENSIVE METABOLIC PANEL
ALT: 20 U/L (ref 0–44)
AST: 21 U/L (ref 15–41)
Albumin: 3.1 g/dL — ABNORMAL LOW (ref 3.5–5.0)
Alkaline Phosphatase: 21 U/L — ABNORMAL LOW (ref 38–126)
Anion gap: 15 (ref 5–15)
BUN: 97 mg/dL — ABNORMAL HIGH (ref 8–23)
CO2: 25 mmol/L (ref 22–32)
Calcium: 9.5 mg/dL (ref 8.9–10.3)
Chloride: 91 mmol/L — ABNORMAL LOW (ref 98–111)
Creatinine, Ser: 5.3 mg/dL — ABNORMAL HIGH (ref 0.44–1.00)
GFR, Estimated: 8 mL/min — ABNORMAL LOW (ref >60)
Glucose, Bld: 95 mg/dL (ref 70–99)
Potassium: 2.9 mmol/L — ABNORMAL LOW (ref 3.5–5.1)
Sodium: 131 mmol/L — ABNORMAL LOW (ref 135–145)
Total Bilirubin: 0.6 mg/dL (ref 0.3–1.2)
Total Protein: 6.4 g/dL — ABNORMAL LOW (ref 6.5–8.1)

## 2021-03-01 LAB — GLUCOSE, CAPILLARY
Glucose-Capillary: 93 mg/dL (ref 70–99)
Glucose-Capillary: 225 mg/dL — ABNORMAL HIGH (ref 70–99)
Glucose-Capillary: 64 mg/dL — ABNORMAL LOW (ref 70–99)
Glucose-Capillary: 132 mg/dL — ABNORMAL HIGH (ref 70–99)
Glucose-Capillary: 151 mg/dL — ABNORMAL HIGH (ref 70–99)

## 2021-03-01 LAB — MAGNESIUM: Magnesium: 2.7 mg/dL — ABNORMAL HIGH (ref 1.7–2.4)

## 2021-03-01 MED ORDER — POTASSIUM CHLORIDE CRYS ER 10 MEQ PO TBCR
10.0000 meq | EXTENDED_RELEASE_TABLET | Freq: Four times a day (QID) | ORAL | Status: AC
  Administered 2021-03-01 (×4): 10 meq via ORAL
  Filled 2021-03-01 (×4): qty 1

## 2021-03-01 MED ORDER — POTASSIUM CHLORIDE 10 MEQ/100ML IV SOLN
10.0000 meq | INTRAVENOUS | Status: DC
  Administered 2021-03-01: 10 meq via INTRAVENOUS
  Filled 2021-03-01: qty 100

## 2021-03-01 MED ORDER — POTASSIUM CHLORIDE 10 MEQ/100ML IV SOLN: 10.0000 meq | INTRAVENOUS | Status: DC

## 2021-03-01 MED ORDER — TORSEMIDE 100 MG PO TABS
100.0000 mg | ORAL_TABLET | Freq: Two times a day (BID) | ORAL | Status: DC
  Administered 2021-03-01 – 2021-03-02 (×2): 100 mg via ORAL
  Filled 2021-03-01 (×2): qty 1

## 2021-03-01 NOTE — Progress Notes (Signed)
Physical Therapy Treatment Patient Details Name: Ashley [name] MRN: [id number] DOB: 22-Nov-1945 Today's Date: 03/01/2021    History of Present Illness 75 y/o female preesenting on 7/10 for shortrness of breath over last 2-3 weeks. CXR showed increasing hazy and patchy opacities in the lower lungs concerning for atelectasis and edema. PMH: HTN, COPD, DM type II, diastolic CHF last EF 60 -25% with grade 1 diastolic dysfunction, DVT no longer on Coumadin, CKD stage 5, CVA, iron deficiency anemia, gait disturbance, anxiety/bipolar disorder, lung cancer in remission, valvular cancer in remission, and GERD.    PT Comments    Pt agreeable to PT in room this session. Pt transfers initially requiring physical assistance to rise and steady once standing, and without physical assistance for subsequent bouts of transfers. Pt tolerates ambulation with RW for a greater total distance, compared to last session. Pt continues to be limited in functional mobility and gait due to weakness and decreased activity tolerance. Pt will continue to benefit from acute PT to improve balance and independence in mobility.   Follow Up Recommendations  Home health PT;Supervision for mobility/OOB     Equipment Recommendations  Rolling walker with 5" wheels    Recommendations for Other Services       Precautions / Restrictions Precautions Precautions: Fall Restrictions Weight Bearing Restrictions: No    Mobility  Bed Mobility Overal bed mobility: Modified Independent             General bed mobility comments: HOB elevated, able to perform without assist    Transfers Overall transfer level: Needs assistance Equipment used: Rolling walker (2 wheeled) Transfers: Sit to/from Stand Sit to Stand: Min assist;Min guard         General transfer comment: min A to rise on initial trial with pt returning to sitting without warning. min G for safety for additional 2 trials during  session.  Ambulation/Gait Ambulation/Gait assistance: Min guard Gait Distance (Feet): 70 Feet (20 ft, 30 ft, 20 ft- seated rest break between each bout) Assistive device: Rolling walker (2 wheeled) Gait Pattern/deviations: Step-through pattern;Decreased stride length;Trunk flexed Gait velocity: decreased Gait velocity interpretation: 1.31 - 2.62 ft/sec, indicative of limited community ambulator General Gait Details: Pt tolerates short gait distances with use of RW and cues for proximity to device. Cues for upright posture during gait.   Stairs             Wheelchair Mobility    Modified Rankin (Stroke Patients Only)       Balance Overall balance assessment: Needs assistance;History of Falls Sitting-balance support: Feet supported Sitting balance-Leahy Scale: Fair     Standing balance support: During functional activity;Bilateral upper extremity supported Standing balance-Leahy Scale: Poor Standing balance comment: reliant on UE support to maintain balance                            Cognition Arousal/Alertness: Awake/alert Behavior During Therapy: WFL for tasks assessed/performed Overall Cognitive Status: History of cognitive impairments - at baseline                                 General Comments: per chart review, pt with baseline memory deficits. Pt given cues to report need to sit during mobility as she sits down without warning      Exercises      General Comments General comments (skin integrity, edema, etc.): VSS on RA.  Pertinent Vitals/Pain Pain Assessment: No/denies pain    Home Living                      Prior Function            PT Goals (current goals can now be found in the care plan section) Acute Rehab PT Goals Patient Stated Goal: Improve independence Progress towards PT goals: Progressing toward goals    Frequency    Min 3X/week      PT Plan Current plan remains appropriate     Co-evaluation              AM-PAC PT "6 Clicks" Mobility   Outcome Measure  Help needed turning from your back to your side while in a flat bed without using bedrails?: None Help needed moving from lying on your back to sitting on the side of a flat bed without using bedrails?: None Help needed moving to and from a bed to a chair (including a wheelchair)?: A Little Help needed standing up from a chair using your arms (e.g., wheelchair or bedside chair)?: A Little Help needed to walk in hospital room?: A Little Help needed climbing 3-5 steps with a railing? : A Lot 6 Click Score: 19    End of Session Equipment Utilized During Treatment: Gait belt Activity Tolerance: Patient limited by fatigue Patient left: in chair;with call bell/phone within reach;with chair alarm set Nurse Communication: Mobility status PT Visit Diagnosis: Unsteadiness on feet (R26.81);Other abnormalities of gait and mobility (R26.89);Repeated falls (R29.6);Muscle weakness (generalized) (M62.81);History of falling (Z91.81);Difficulty in walking, not elsewhere classified (R26.2)     Time: 8657-8469 PT Time Calculation (min) (ACUTE ONLY): 35 min  Charges:  $Gait Training: 23-37 mins                     Acute Rehab  Pager: [phone]    Garwin Brothers, SPT  03/01/2021, 12:34 PM

## 2021-03-01 NOTE — Progress Notes (Signed)
Pt cbg was 64 but dinner tray had come up for pt. Pt CBG rechecked to be 132. Pt denied any symptoms or discomfort. Delia Heady RN

## 2021-03-01 NOTE — Progress Notes (Addendum)
PROGRESS NOTE    Ashley [name]  SEG:[id number] DOB: Dec 27, 1945 DOA: 02/24/2021 PCP: Unk Pinto, MD    Brief Narrative:  Ashley [name] was admitted to the hospital with the working diagnosis of acute on chronic diastolic heart failure decompensation.    75 year old female past medical history for hypertension, COPD, type 2 diabetes mellitus, heart failure DVT, chronic kidney disease stage V, iron deficiency anemia, history of CVA, ambulatory dysfunction, anxiety/bipolar disorder and lung cancer in remission who presented with dyspnea for the last 2 to 3 weeks.  Persistent and progressive dyspnea, associated with orthopnea, wheezing and lower extremity edema.  10 pound weight gain over the last 2 weeks.  Patient has been depressed and not taking her medicines.  On her initial physical examination her blood pressure was 210/100, heart rate 90, respiratory rate 22, oxygen saturation 99% on room air, lungs decreased air movement, no rhonchi or rales, heart S1-S2, present, rhythmic, abdomen soft, positive bilateral lower extremity edema.   Sodium 134, potassium 3.7, chloride 101, bicarb 21, glucose 207, BUN 64, creatinine 4.15 BNP 2213, high sensitive troponin 92-92, white count 6.0, hemoglobin 9.7, hematocrit 30.3, platelets 298 SARS COVID-19 negative.   Urinalysis specific gravity 1.008, 6-10 white cells, 0-5 red cells, 100 protein, > 500 glucose.   Chest radiograph, bilateral hilar vascular congestion.   EKG 83 bpm, normal axis, normal intervals, sinus rhythm, Q-wave V1-V3, no significant ST segment or T wave changes.   Patient was placed on furosemide for diuresis along with metolazone.   Patient with worsening renal function, imminent need for renal replacement therapy.  07/15 family meeting with nephrology and palliative care, patient still not decided about renal replacement therapy.    Assessment & Plan:   Principal Problem:   Acute CHF (congestive heart failure) ([facility]) Active  Problems:   Major depressive disorder, recurrent episode ([facility])   COPD   Abnormality of gait   Hypothyroidism   Memory difficulty   Hyperlipidemia associated with type 2 diabetes mellitus ([facility])   Hypertensive urgency   Elevated troponin   CKD (chronic kidney disease) stage 5, GFR less than 15 ml/min ([facility])   DNR (do not resuscitate)    Acute decompensation of chronic diastolic heart failure/ HTN.  Hypervolemia continue to improve, her urine output is 2,125 over last 24 hrs. No dyspnea and improved lower extremity edema.    Continue diuresis with furosemide 80 mg Iv q 8 hrs Continue with hydralazine and amlodipine for blood pressure control.   2. AKI on CKD stage V./ hypokalemia, Serum cr today is 5,30 with K down to 2,9 and serum bicarbonate at 25.  Continue to have urine output over 2 liters on high doses of IV furosemide 80 mg IV q8 hrs. Add 40 meq Kcl today and follow renal function in am.  Patient will need renal replacement therapy in the short term, she is still not decided. If she declined hemodialysis, will plan to dc home with hospice services.  Agree on transition to oral diuretic therapy with torsemide.   Anemia of chronic renal disease, continue with Aranesp and oral iron.    3. T2DM with dyslipidemia. Fasting glucose today is 97 mg/dl.  Continue with insulin sliding scalae for glucose cover and monitoring.  Continue with atorvastatin and fenofibrate.     4. COPD/. Continue with bronchodilator therapy.   5. Hypothyroid. continue with levothyroxine.   6. Depression.  Continue with clorazepate, fluoxetine   Patient continue to be at high risk for worsening renal function.  Status is: Inpatient  Remains inpatient appropriate because:IV treatments appropriate due to intensity of illness or inability to take PO  Dispo: The patient is from: Home              Anticipated d/c is to: Home              Patient currently is not medically stable to d/c.   Difficult  to place patient No   DVT prophylaxis: Heparin   Code Status:   DNR   Family Communication:  I spoke with patient's brother at the bedside, we talked in detail about patient's condition, plan of care and prognosis and all questions were addressed.   Consultants:  Nephrology  Palliative care    Subjective: Patient continue to be very weak and deconditioned, no nausea or vomiting, no dyspnea or chest pain,   Objective: Vitals:   02/28/21 1948 02/28/21 2000 03/01/21 0334 03/01/21 0516  BP: 136/69   (!) 160/62  Pulse: 85   79  Resp: 16   18  Temp: 98 F (36.7 C)   98.4 F (36.9 C)  TempSrc:    Oral  SpO2: 98% 100%  94%  Weight:   68.7 kg   Height:        Intake/Output Summary (Last 24 hours) at 03/01/2021 1132 Last data filed at 03/01/2021 3785 Gross per 24 hour  Intake 805 ml  Output 2425 ml  Net -1620 ml   Filed Weights   02/27/21 0330 02/28/21 0336 03/01/21 0334  Weight: 70.3 kg 68.6 kg 68.7 kg    Examination:   General: Not in pain or dyspnea, deconditioned  Neurology: Awake and alert, non focal  E ENT: mild pallor, no icterus, oral mucosa moist Cardiovascular: No JVD. S1-S2 present, rhythmic, no gallops, rubs, or murmurs. Trace lower extremity edema. Pulmonary: vesicular breath sounds bilaterally, adequate air movement, no wheezing, rhonchi or rales on anterior auscultation  Gastrointestinal. Abdomen soft and non tender Skin. No rashes Musculoskeletal: no joint deformities     Data Reviewed: I have personally reviewed following labs and imaging studies  CBC: Recent Labs  Lab 02/24/21 0210 02/24/21 0316 02/26/21 0151 02/27/21 0502  WBC 6.0  --  4.7 4.9  NEUTROABS 4.8  --  3.4 3.4  HGB 9.7* 9.5* 8.5* 7.9*  HCT 30.3* 28.0* 26.4* 24.3*  MCV 94.1  --  91.3 91.4  PLT 298  --  268 885   Basic Metabolic Panel: Recent Labs  Lab 02/25/21 0501 02/26/21 0151 02/27/21 0502 02/28/21 0359 03/01/21 0323  NA 133* 136 134* 133* 131*  K 3.6 3.5 3.2* 3.4*  2.9*  CL 98 99 99 95* 91*  CO2 21* 22 22 24 25   GLUCOSE 132* 141* 105* 102* 95  BUN 70* 76* 81* 90* 97*  CREATININE 4.47* 4.69* 4.69* 5.33* 5.30*  CALCIUM 9.2 9.6 9.1 9.4 9.5  MG  --  2.6* 2.5* 2.7* 2.7*  PHOS  --  6.0* 6.6*  --   --    GFR: Estimated Creatinine Clearance: 8.8 mL/min (A) (by C-G formula based on SCr of 5.3 mg/dL (H)). Liver Function Tests: Recent Labs  Lab 02/24/21 0210 02/26/21 0151 02/27/21 0502 02/28/21 0359 03/01/21 0323  AST 16 15 13* 17 21  ALT 12 16 14 16 20   ALKPHOS 24* 23* 23* 25* 21*  BILITOT 0.8 0.7 0.4 0.5 0.6  PROT 6.9 6.5 5.9* 6.6 6.4*  ALBUMIN 3.3* 3.1* 3.0* 3.4* 3.1*   No results for input(s): LIPASE, AMYLASE  in the last 168 hours. No results for input(s): AMMONIA in the last 168 hours. Coagulation Profile: No results for input(s): INR, PROTIME in the last 168 hours. Cardiac Enzymes: No results for input(s): CKTOTAL, CKMB, CKMBINDEX, TROPONINI in the last 168 hours. BNP (last 3 results) No results for input(s): PROBNP in the last 8760 hours. HbA1C: No results for input(s): HGBA1C in the last 72 hours. CBG: Recent Labs  Lab 02/28/21 0606 02/28/21 1209 02/28/21 1756 02/28/21 2105 03/01/21 0614  GLUCAP 102* 148* 190* 122* 93   Lipid Profile: No results for input(s): CHOL, HDL, LDLCALC, TRIG, CHOLHDL, LDLDIRECT in the last 72 hours. Thyroid Function Tests: No results for input(s): TSH, T4TOTAL, FREET4, T3FREE, THYROIDAB in the last 72 hours. Anemia Panel: Recent Labs    02/27/21 0847  FERRITIN 189  TIBC 298  IRON 68      Radiology Studies: I have reviewed all of the imaging during this hospital visit personally     Scheduled Meds:  amLODipine  10 mg Oral Daily   atorvastatin  40 mg Oral Daily   darbepoetin (ARANESP) injection - NON-DIALYSIS  40 mcg Subcutaneous Q Wed-1800   famotidine  20 mg Oral Daily   fenofibrate  160 mg Oral Daily   ferrous sulfate  325 mg Oral BID WC   FLUoxetine  20 mg Oral Daily    furosemide  80 mg Intravenous Q8H   heparin  5,000 Units Subcutaneous Q8H   hydrALAZINE  100 mg Oral Q8H   insulin aspart  0-15 Units Subcutaneous TID WC   levothyroxine  88 mcg Oral QAC breakfast   potassium chloride  10 mEq Oral QID   sodium chloride flush  3 mL Intravenous Q12H   Continuous Infusions:  sodium chloride       LOS: 5 days        Messiah Ahr Gerome Apley, MD

## 2021-03-01 NOTE — Progress Notes (Signed)
 3E09  Manufacturing engineer [facility]) Liaison Note  Asked by PMT to meet with pt to offer information about hospice services at home.  TOC Deborah aware.  Met at bedside with patient and pt's brother, Fritz Pickerel, to initiate education on hospice services.  Pt interested in hospice and would like to think about it.  Report exchanged with Neoma Laming, TOC, after visit.  [facility] contact numbers given to pt and family.    Thank you for the opportunity to participate in this pt's care.  Domenic Moras, BSN, RN [facility] liaison [phone] (24h on call)

## 2021-03-01 NOTE — Progress Notes (Signed)
[facility] Progress Note   Subjective:   UOP 2171mL yesterday.  Feeling poorly it seems mainly due to the decisions she's facing.  Some nausea.  Breathing improved.     Objective Vitals:   02/28/21 1948 02/28/21 2000 03/01/21 0334 03/01/21 0516  BP: 136/69   (!) 160/62  Pulse: 85   79  Resp: 16   18  Temp: 98 F (36.7 C)   98.4 F (36.9 C)  TempSrc:    Oral  SpO2: 98% 100%  94%  Weight:   68.7 kg   Height:       Physical Exam General: chronically ill and anxious - not far from baseline  Heart:RRR, no rub Lungs: normal WOB, no rales, a few exp wheezes Abdomen:soft Extremities:resolved LE edema   Additional Objective Labs: Basic Metabolic Panel: Recent Labs  Lab 02/26/21 0151 02/27/21 0502 02/28/21 0359 03/01/21 0323  NA 136 134* 133* 131*  K 3.5 3.2* 3.4* 2.9*  CL 99 99 95* 91*  CO2 22 22 24 25   GLUCOSE 141* 105* 102* 95  BUN 76* 81* 90* 97*  CREATININE 4.69* 4.69* 5.33* 5.30*  CALCIUM 9.6 9.1 9.4 9.5  PHOS 6.0* 6.6*  --   --     Liver Function Tests: Recent Labs  Lab 02/27/21 0502 02/28/21 0359 03/01/21 0323  AST 13* 17 21  ALT 14 16 20   ALKPHOS 23* 25* 21*  BILITOT 0.4 0.5 0.6  PROT 5.9* 6.6 6.4*  ALBUMIN 3.0* 3.4* 3.1*    No results for input(s): LIPASE, AMYLASE in the last 168 hours. CBC: Recent Labs  Lab 02/24/21 0210 02/24/21 0316 02/26/21 0151 02/27/21 0502  WBC 6.0  --  4.7 4.9  NEUTROABS 4.8  --  3.4 3.4  HGB 9.7* 9.5* 8.5* 7.9*  HCT 30.3* 28.0* 26.4* 24.3*  MCV 94.1  --  91.3 91.4  PLT 298  --  268 269    Blood Culture    Component Value Date/Time   SDES  11/07/2020 1255    Urine Performed at [facility], [place] [address], [place], [place] [address]    SPECREQUEST  11/07/2020 1255    NONE Performed at [facility], [place] [address] [place], [place] [address]    CULT >=100,000 COLONIES/mL KLEBSIELLA PNEUMONIAE (A) 11/07/2020 1255   REPTSTATUS 11/10/2020 FINAL  11/07/2020 1255    Cardiac Enzymes: No results for input(s): CKTOTAL, CKMB, CKMBINDEX, TROPONINI in the last 168 hours. CBG: Recent Labs  Lab 02/28/21 0606 02/28/21 1209 02/28/21 1756 02/28/21 2105 03/01/21 0614  GLUCAP 102* 148* 190* 122* 93    Iron Studies:  Recent Labs    02/27/21 0847  IRON 68  TIBC 298  FERRITIN 189    @lablastinr3 @ Studies/Results: No results found. Medications:  sodium chloride      amLODipine  10 mg Oral Daily   atorvastatin  40 mg Oral Daily   darbepoetin (ARANESP) injection - NON-DIALYSIS  40 mcg Subcutaneous Q Wed-1800   famotidine  20 mg Oral Daily   fenofibrate  160 mg Oral Daily   ferrous sulfate  325 mg Oral BID WC   FLUoxetine  20 mg Oral Daily   furosemide  80 mg Intravenous Q8H   heparin  5,000 Units Subcutaneous Q8H   hydrALAZINE  100 mg Oral Q8H   insulin aspart  0-15 Units Subcutaneous TID WC   levothyroxine  88 mcg Oral QAC breakfast   potassium chloride  10 mEq Oral QID  sodium chloride flush  3 mL Intravenous Q12H    Assessment/Recommendations:    CKD5, stable: Cr seems to be around 4. Underlying etiology of CKD likely secondary to DM and HTN -Worsening renal function in face of obligatory diuresis.   -Recommended initiation of dialysis as I suspect she will continue to progress from here.  She continues to be very conflicted re: options.  We had a lengthy family meeting today with HCPOA brother and palliative care present to discuss options: dialysis now, hospice OR go home with medications to see how she fares.  The last option is not recommended and comes with a high risk of decompensation and rehospitalization likely with her worse off yet. She certainly has a lot of difficulties with this decision making process but clearly says NO to both dialysis and hospice at this moment.   -Appears euvolemic, so switch to oral diuretic now -If not decision re: HD vs hospice we will plan for her to discharge tomorrow if stable.  I  have recommended against this option but if no other plans to be pursued I think this is the only feasible option.   -Continue to monitor daily Cr, Dose meds for GFR<15 -Monitor Daily I/Os, Daily weight -Maintain MAP>65 for optimal renal perfusion. -Agree with holding ACE-I, avoid further nephrotoxins including NSAIDS, Morphine.  Unless absolutely necessary, avoid CT with contrast and/or MRI with gadolinium.       Acute on chronic dCHF exacerbation: presented with symptoms of orthopnea and dyspnea, wt gain.  - euvolemic now -change to torsemide 100 BID with PRN metolazone -Seems main issue was skipping meds and dietary indiscretion that led to the presentation   Hypertension: -diuresis and home meds   Anemia due to chronic kidney disease: -Transfuse for Hgb<7 g/dL -iron replete -started ESA   Diabetes Mellitus Type 2  -per primary service; last A1c 01/15/21 was 6.3  Jannifer Hick MD 03/01/2021, 10:56 AM  [facility] Pager: [phone]

## 2021-03-01 NOTE — TOC Progression Note (Addendum)
Transition of Care [facility]) - Progression Note    Patient Details  Name: Ashley [name] MRN: [id number] Date of Birth: 1946/06/23  [facility] CM/SW Contact  Zenon Mayo, RN Phone Number: 03/01/2021, 5:18 PM  Clinical Narrative:    Patient states she still need to think about if she wants home hospice or not.  She has not decided after a hospice rep gave her information on home hospice.          Expected Discharge Plan and Services                                                 Social Determinants of Health (SDOH) Interventions    Readmission Risk Interventions No flowsheet data found.

## 2021-03-01 NOTE — Progress Notes (Signed)
Palliative Medicine Inpatient Follow Up Note  Reason for consult:  "Admitted basically because not taking any of her medications, who has multisystem organ failure.  Evaluate for palliative care at home vs hospice"   HPI:  Per intake H&P --> Zelma Snead Ogletree is a 75 y.o. WF PMHx  HTN, COPD, DM type II, dDiana L Sadek is a 75 y.o. 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  At baseline patient reports that she has issues with balance and despite using a cane is prone to fall.  Over the last 2 to 3 weeks patient reports that she has been short of breath, but is not normally on oxygen at baseline.  She had been smoking up until February of this year when she decided to quit.  Patient complains of having difficulty sleeping at night feeling as though she is suffocating having to sit up to catch her breath.  Associated symptoms include chest discomfort, wheezing, and leg swelling left leg worse than the right.   Palliative care has been asked to get involved to further help aid in goals of care conversations.  Diane is in a position where she will either need to elect for hemodialysis treatments or potential hospice care.  Today's Discussion (03/01/2021):  *Please note that this is a verbal dictation therefore any spelling or grammatical errors are due to the "[place]" system interpretation.  Family Meeting Participants: Kaleen Mask (brother) and Carrera Kiesel. Heckstall (patient)  Providers Present: Dr. [name] (nephrology), Tacey Ruiz, [facility], and Moses Manners, RN  Content of Meeting:  Dr. [name] myself and Lannette Donath met with Lanice and her brother Fritz Pickerel at bedside.  Dr. [name] started the  meeting by sharing with Jazmaine that at this point time her kidney function is not improving and she would recommend initiation of hemodialysis upon this hospitalization.  Arlett shares that she is "scared" to start dialysis and "scared" to not start dialysis as she fears death and dying.  We reviewed with her her history of noncompliance and she states that even if starting dialysis she would likely be compliant for a couple days to weeks prior to determining she no longer wanted treatments.  She explains that she has a long history of behaviors such as these and she knows in time she will not be compliant with dialysis treatments.   Dr. [name] reviewed the three options that presently Seona has.  She shared that the first option is to pursue hemodialysis, this would be not abiding and if at any point Dalyah does not feel that treatments are benefiting her she would have the ability to stop.  The next option would be for Leniya to go home with hospice care.  Reviewed that hospice would focus on comprehensive symptom management for Latunya and enable her the opportunity to stay in her most comfortable environment.  The final option is for Logen to go home without determining that she would like dialysis or hospice though knowing the risks that she will likely be back in days to weeks in an emergent situation possibly for treatment.  Aira has a lot of fear about people coming into her home due to her hoarding mentality.  We tried  to work through some of this in conversation.  She then perseverated on her lack of support by her husband, [name].  This quickly turned into her being frightful of not being able to get to the bathroom quickly enough and what to do in instances such as those.  Were able to review the importance of depends pull-ups, bedside commode, and additional DME equipment could be ordered by the PT/OT teams.  Again the three options were provided to [facility].    Dr. [name] shared that at this point in  time if [facility] tomorrow appears stable we will proceed with the plan for discharge.  The plan for today as it sits presently is to transition Ashleen to oral diuretics to better gauge her response.  Tyerra requests more information on hospice.  I told her I would get in touch with the hospice liaison's in the hospital to have a sitdown formal meeting with her.  Questions and concerns addressed   Objective Assessment: Vital Signs Vitals:   02/28/21 2000 03/01/21 0516  BP:  (!) 160/62  Pulse:  79  Resp:  18  Temp:  98.4 F (36.9 C)  SpO2: 100% 94%    Intake/Output Summary (Last 24 hours) at 03/01/2021 1209 Last data filed at 03/01/2021 4604 Gross per 24 hour  Intake 805 ml  Output 2425 ml  Net -1620 ml   Last Weight  Most recent update: 03/01/2021  3:34 AM    Weight  68.7 kg (151 lb 7.3 oz)            Gen:  Elderly F in NAD HEENT: moist mucous membranes CV: Regular rate and rhythm PULM: On RA ABD: soft/nontender EXT: No edema Neuro:  A+O x3, tangential   SUMMARY OF RECOMMENDATIONS   DNAR/DNI   MOST/DNR Form Completed, paper copy placed onto the chart electric copy can be found in Vyn[employer]   Obtained a copy of advance directives   Appreciate [facility] liaison meeting with Beverlee Nims for informational purposes  Appreciate spiritual care support  Ongoing palliative medicine team support - If patient remains undecided at the time of discharge will at the very least recommend outpatient palliative follow along with her  Time Spent: 60 Greater than 50% of the time was spent in counseling and coordination of care ______________________________________________________________________________________ [place] Team Team Cell Phone: [phone] Please utilize secure chat with additional questions, if there is no response within 30 minutes please call the above phone number  Palliative Medicine Team providers are available by phone from 7am  to 7pm daily and can be reached through the team cell phone.  Should this patient require assistance outside of these hours, please call the patient's attending physician.

## 2021-03-02 DIAGNOSIS — I5031 Acute diastolic (congestive) heart failure: Secondary | ICD-10-CM | POA: Diagnosis not present

## 2021-03-02 DIAGNOSIS — N185 Chronic kidney disease, stage 5: Secondary | ICD-10-CM | POA: Diagnosis not present

## 2021-03-02 DIAGNOSIS — R413 Other amnesia: Secondary | ICD-10-CM

## 2021-03-02 DIAGNOSIS — R778 Other specified abnormalities of plasma proteins: Secondary | ICD-10-CM | POA: Diagnosis not present

## 2021-03-02 DIAGNOSIS — J438 Other emphysema: Secondary | ICD-10-CM

## 2021-03-02 DIAGNOSIS — Z66 Do not resuscitate: Secondary | ICD-10-CM | POA: Diagnosis not present

## 2021-03-02 DIAGNOSIS — Z515 Encounter for palliative care: Secondary | ICD-10-CM | POA: Diagnosis not present

## 2021-03-02 LAB — COMPREHENSIVE METABOLIC PANEL
ALT: 20 U/L (ref 0–44)
AST: 20 U/L (ref 15–41)
Albumin: 3.3 g/dL — ABNORMAL LOW (ref 3.5–5.0)
Alkaline Phosphatase: 21 U/L — ABNORMAL LOW (ref 38–126)
Anion gap: 17 — ABNORMAL HIGH (ref 5–15)
BUN: 100 mg/dL — ABNORMAL HIGH (ref 8–23)
CO2: 22 mmol/L (ref 22–32)
Calcium: 9.4 mg/dL (ref 8.9–10.3)
Chloride: 91 mmol/L — ABNORMAL LOW (ref 98–111)
Creatinine, Ser: 5.56 mg/dL — ABNORMAL HIGH (ref 0.44–1.00)
GFR, Estimated: 8 mL/min — ABNORMAL LOW (ref >60)
Glucose, Bld: 127 mg/dL — ABNORMAL HIGH (ref 70–99)
Potassium: 3.5 mmol/L (ref 3.5–5.1)
Sodium: 130 mmol/L — ABNORMAL LOW (ref 135–145)
Total Bilirubin: 0.6 mg/dL (ref 0.3–1.2)
Total Protein: 6.5 g/dL (ref 6.5–8.1)

## 2021-03-02 LAB — GLUCOSE, CAPILLARY
Glucose-Capillary: 142 mg/dL — ABNORMAL HIGH (ref 70–99)
Glucose-Capillary: 135 mg/dL — ABNORMAL HIGH (ref 70–99)
Glucose-Capillary: 199 mg/dL — ABNORMAL HIGH (ref 70–99)
Glucose-Capillary: 98 mg/dL (ref 70–99)

## 2021-03-02 LAB — MAGNESIUM: Magnesium: 2.7 mg/dL — ABNORMAL HIGH (ref 1.7–2.4)

## 2021-03-02 MED ORDER — TORSEMIDE 100 MG PO TABS
100.0000 mg | ORAL_TABLET | Freq: Every day | ORAL | Status: DC
  Administered 2021-03-03: 100 mg via ORAL
  Filled 2021-03-02: qty 1

## 2021-03-02 NOTE — Progress Notes (Addendum)
Palliative Medicine Inpatient Follow Up Note  Reason for consult:  "Admitted basically because not taking any of her medications, who has multisystem organ failure.  Evaluate for palliative care at home vs hospice"   HPI:  Per intake H&P --> Ashley [name] is a 75 y.o. WF PMHx  HTN, COPD, DM type II, dDiana L [name] is a 75 y.o. WF PMHx  HTN, COPD, DM type II, diastolic CHF last EF 60 -93% with grade 1 diastolic dysfunction, DVT no longer on Coumadin, CKD stage 5, CVA, iron deficiency anemia, gait disturbance, anxiety/bipolar disorder, lung cancer in remission, valvular cancer in remission, and GERD presents with complaints of shortness of breath over the last 2 to 3 weeks.  At baseline patient reports that she has issues with balance and despite using a cane is prone to fall.  Over the last 2 to 3 weeks patient reports that she has been short of breath, but is not normally on oxygen at baseline.  She had been smoking up until February of this year when she decided to quit.  Patient complains of having difficulty sleeping at night feeling as though she is suffocating having to sit up to catch her breath.  Associated symptoms include chest discomfort, wheezing, and leg swelling left leg worse than the right.   Palliative care has been asked to get involved to further help aid in goals of care conversations.  Diane is in a position where she will either need to elect for hemodialysis treatments or potential hospice care.  Today's Discussion (03/02/2021):  *Please note that this is a verbal dictation therefore any spelling or grammatical errors are due to the "[place]" system interpretation.  Chart reviewed.  I met at bedside with Undra, she was prepared to eat her lunch at this time. She invited me in to talk. We reviewed her present health state and the difficulty that she is encountering with coming to a decision as it relates to whether or not to pursue hemodialysis. She expresses that she  is "not ready to die and wants to continue living" though she attests that she will not be able to realistically get to a dialysis center three times a week. She shares that the burdens of these decisions are great and "no one can help me". I reviewed with her that there are three options presently. I shared the importance of making a decision now will save she and her family great difficulty down the road. She expresses understanding and would like another day to think about this.  Support provided. Patient expressed a variety of emotions and feelings of angst towards her relationship with her husband. I provided support through therapeutic listening.   Questions and concerns addressed   Objective Assessment: Vital Signs Vitals:   03/02/21 0529 03/02/21 0842  BP: (!) 155/70 (!) 159/70  Pulse: 80 76  Resp: 16 18  Temp: 97.6 F (36.4 C)   SpO2: 99% 100%    Intake/Output Summary (Last 24 hours) at 03/02/2021 1230 Last data filed at 03/02/2021 1058 Gross per 24 hour  Intake 960 ml  Output 1300 ml  Net -340 ml    Last Weight  Most recent update: 03/02/2021  6:12 AM    Weight  66.2 kg (145 lb 15.1 oz)            Gen:  Elderly F in NAD HEENT: moist mucous membranes CV: Regular rate and rhythm PULM: On RA ABD: soft/nontender EXT: No edema Neuro:  A+O x3, tangential   SUMMARY OF RECOMMENDATIONS   DNAR/DNI   MOST/DNR Form Completed    A copy of advance directives scanned into [employer]  Appreciate spiritual care support  Will refer for OP Palliative support - Have spoken to [facility]  Time Spent: 35 Greater than 50% of the time was spent in counseling and coordination of care __________________________________________________________________________________ [place] Team Team Cell Phone: [phone] Please utilize secure chat with additional questions, if there is no response within 30 minutes please call the above phone  number  Palliative Medicine Team providers are available by phone from 7am to 7pm daily and can be reached through the team cell phone.  Should this patient require assistance outside of these hours, please call the patient's attending physician.

## 2021-03-02 NOTE — Progress Notes (Signed)
[facility] Progress Note   Subjective:   UOP 1281mL yesterday.  Feeling poorly it seems mainly due to the decisions she's facing.  Some nausea.  Breathing improved.     Objective Vitals:   03/01/21 1213 03/01/21 2015 03/02/21 0529 03/02/21 0842  BP: 135/61 (!) 133/56 (!) 155/70 (!) 159/70  Pulse: 78 81 80 76  Resp: 17 16 16 18   Temp: 98.6 F (37 C) 98.3 F (36.8 C) 97.6 F (36.4 C)   TempSrc: Oral Oral Oral   SpO2: 99% 98% 99% 100%  Weight:   66.2 kg   Height:       Physical Exam General: chronically ill and anxious - not far from baseline  Heart:RRR, no rub Lungs: normal WOB, no rales, a few exp wheezes Abdomen:soft Extremities:resolved LE edema   Additional Objective Labs: Basic Metabolic Panel: Recent Labs  Lab 02/26/21 0151 02/27/21 0502 02/28/21 0359 03/01/21 0323 03/02/21 0348  NA 136 134* 133* 131* 130*  K 3.5 3.2* 3.4* 2.9* 3.5  CL 99 99 95* 91* 91*  CO2 22 22 24 25 22   GLUCOSE 141* 105* 102* 95 127*  BUN 76* 81* 90* 97* 100*  CREATININE 4.69* 4.69* 5.33* 5.30* 5.56*  CALCIUM 9.6 9.1 9.4 9.5 9.4  PHOS 6.0* 6.6*  --   --   --     Liver Function Tests: Recent Labs  Lab 02/28/21 0359 03/01/21 0323 03/02/21 0348  AST 17 21 20   ALT 16 20 20   ALKPHOS 25* 21* 21*  BILITOT 0.5 0.6 0.6  PROT 6.6 6.4* 6.5  ALBUMIN 3.4* 3.1* 3.3*    No results for input(s): LIPASE, AMYLASE in the last 168 hours. CBC: Recent Labs  Lab 02/24/21 0210 02/24/21 0316 02/26/21 0151 02/27/21 0502  WBC 6.0  --  4.7 4.9  NEUTROABS 4.8  --  3.4 3.4  HGB 9.7* 9.5* 8.5* 7.9*  HCT 30.3* 28.0* 26.4* 24.3*  MCV 94.1  --  91.3 91.4  PLT 298  --  268 269    Blood Culture    Component Value Date/Time   SDES  11/07/2020 1255    Urine Performed at [facility], [place] [address] [place], [place] [address]    SPECREQUEST  11/07/2020 1255    NONE Performed at [facility], [place] [address], [place], [place] [address]     CULT >=100,000 COLONIES/mL KLEBSIELLA PNEUMONIAE (A) 11/07/2020 1255   REPTSTATUS 11/10/2020 FINAL 11/07/2020 1255    Cardiac Enzymes: No results for input(s): CKTOTAL, CKMB, CKMBINDEX, TROPONINI in the last 168 hours. CBG: Recent Labs  Lab 03/01/21 1222 03/01/21 1659 03/01/21 1749 03/01/21 2113 03/02/21 0607  GLUCAP 225* 64* 132* 151* 142*    Iron Studies: No results for input(s): IRON, TIBC, TRANSFERRIN, FERRITIN in the last 72 hours.  @lablastinr3 @ Studies/Results: No results found. Medications:  sodium chloride      amLODipine  10 mg Oral Daily   atorvastatin  40 mg Oral Daily   darbepoetin (ARANESP) injection - NON-DIALYSIS  40 mcg Subcutaneous Q Wed-1800   famotidine  20 mg Oral Daily   fenofibrate  160 mg Oral Daily   ferrous sulfate  325 mg Oral BID WC   FLUoxetine  20 mg Oral Daily   heparin  5,000 Units Subcutaneous Q8H   hydrALAZINE  100 mg Oral Q8H   insulin aspart  0-15 Units Subcutaneous TID WC   levothyroxine  88 mcg Oral QAC breakfast   sodium chloride flush  3  mL Intravenous Q12H   torsemide  100 mg Oral BID    Assessment/Recommendations:    CKD5, stable: Cr seems to be around 4. Underlying etiology of CKD likely secondary to DM and HTN -Worsening renal function in face of obligatory diuresis.   -Recommended initiation of dialysis as I suspect she will continue to progress from here.  She continues to be very conflicted re: options.  We had another lengthy family meeting yesterday with HCPOA brother and palliative care present to discuss options: dialysis now, hospice OR go home with medications to see how she fares.  The last option is not recommended and comes with a high risk of decompensation and rehospitalization likely with her worse off yet. She continues to decline any sort of formal decision though today when discussing her priorities NEVER coming back to the hospital [facility] or other) seems to come up frequently as a main wish.  I have encouraged  her to do hospice as this fits with her priority but she isn't ready to make that decision.  I encouraged her to continued to d/w family, palliative care and primary team  -If she discharged I will plan to see her in my clinic to try to help her, though she is declining current recommendations and I'm not sure how much I have to offer her -provide renal diet instructions on discharge should she choose to go  -Continue to monitor daily Cr, Dose meds for GFR<15 -Monitor Daily I/Os, Daily weight -Maintain MAP>65 for optimal renal perfusion. -Agree with holding ACE-I, avoid further nephrotoxins including NSAIDS, Morphine.  Unless absolutely necessary, avoid CT with contrast and/or MRI with gadolinium.       Acute on chronic dCHF exacerbation: presented with symptoms of orthopnea and dyspnea, wt gain.  - euvolemic now -discharge would be on torsemide 100 BID with metolazone 2.5 qam PRN edema/wt gain -Seems main issue was skipping meds and dietary indiscretion that led to the presentation   Hypertension:  cont current meds   Anemia due to chronic kidney disease: -Transfuse for Hgb<7 g/dL -iron replete -started ESA; if she continues to come to clinic we can arrange this outpt if she's willing to go   Diabetes Mellitus Type 2  -per primary service; last A1c 01/15/21 was 6.3  Dispo: potential d/c today per the above discussion; call me with concerns.  Jannifer Hick MD 03/02/2021, 9:38 AM  [facility] Pager: [phone]

## 2021-03-02 NOTE — Progress Notes (Signed)
PROGRESS NOTE    Lonni Dirden Pointer  VHQ:[id number] DOB: [birth year] DOA: 02/24/2021 PCP: Unk Pinto, MD    Brief Narrative:  Mrs. Ashley [name] was admitted to the hospital with the working diagnosis of acute on chronic diastolic heart failure decompensation.    75 year old female past medical history for hypertension, COPD, type 2 diabetes mellitus, heart failure DVT, chronic kidney disease stage V, iron deficiency anemia, history of CVA, ambulatory dysfunction, anxiety/bipolar disorder and lung cancer in remission who presented with dyspnea for the last 2 to 3 weeks.  Persistent and progressive dyspnea, associated with orthopnea, wheezing and lower extremity edema.  10 pound weight gain over the last 2 weeks.  Patient has been depressed and not taking her medicines.  On her initial physical examination her blood pressure was 210/100, heart rate 90, respiratory rate 22, oxygen saturation 99% on room air, lungs decreased air movement, no rhonchi or rales, heart S1-S2, present, rhythmic, abdomen soft, positive bilateral lower extremity edema.   Sodium 134, potassium 3.7, chloride 101, bicarb 21, glucose 207, BUN 64, creatinine 4.15 BNP 2213, high sensitive troponin 92-92, white count 6.0, hemoglobin 9.7, hematocrit 30.3, platelets 298 SARS COVID-19 negative.   Urinalysis specific gravity 1.008, 6-10 white cells, 0-5 red cells, 100 protein, > 500 glucose.   Chest radiograph, bilateral hilar vascular congestion.   EKG 83 bpm, normal axis, normal intervals, sinus rhythm, Q-wave V1-V3, no significant ST segment or T wave changes.   Patient was placed on furosemide for diuresis along with metolazone.   Patient with worsening renal function, imminent need for renal replacement therapy.   07/15 family meeting with nephrology and palliative care, about renal replacement therapy.     Patient has decided to continue care as outpatient under hospice, continue to decline renal replacement therapy.     Assessment & Plan:   Principal Problem:   Acute CHF (congestive heart failure) (HCC) Active Problems:   Major depressive disorder, recurrent episode (HCC)   COPD   Abnormality of gait   Hypothyroidism   Memory difficulty   Hyperlipidemia associated with type 2 diabetes mellitus (HCC)   Hypertensive urgency   Elevated troponin   CKD (chronic kidney disease) stage 5, GFR less than 15 ml/min (HCC)   DNR (do not resuscitate)   Acute decompensation of chronic diastolic heart failure/ HTN.  Volume seems to be stable today, her oxygenation is 99% on 1 L/min per [place]. No significant edema, no JVD. Blood pressure systolic 413 mmHg.   Continue diuresis and blood pressure control.  Continue with amlodipine and hydralazine     2. AKI on CKD stage V./ hypokalemia, hyponatremia, anion gap metabolic acidosis.  Renal function with worsening cr at 5,56 with K at 3,5 and serum bicarbonate is 22, BUN is 100, anion gap 17.   Patient continue to decline renal replacement therapy. Plan to discharge patient with hospice, continue with torsemide for diuresis and as needed metolazone. Patient complains of persistent urination.  Will give a trial of once daily torsemide 100 mg daily for now, to improve patient's quality of life.  Plan for dc home with hospice in am.     Anemia of chronic renal disease with iron deficiency, continue with oral iron.    3. T2DM with dyslipidemia. Glucose this am is 130 mg/dl.  Plan to continue with insulin sliding scalae for glucose cover and monitoring.   Continue with atorvastatin and fenofibrate.     4. COPD/.  On bronchodilator therapy.   5. Hypothyroid.  On  Levothyroxine.   6. Depression.  On clorazepate, fluoxetine   Patient continue to be at high risk for worsening renal function   Status is: Inpatient  Remains inpatient appropriate because:Inpatient level of care appropriate due to severity of illness  Dispo: The patient is from: Home               Anticipated d/c is to: Home              Patient currently is not medically stable to d/c.   Difficult to place patient No   DVT prophylaxis: Heparin   Code Status:   DNR   Family Communication:  No family at the bedside       Consultants:  Nephrology    Subjective: Patient is feeling tired and weak, positive dyspnea, but not edema, no chest pain, no nausea or vomiting.   Objective: Vitals:   03/01/21 1213 03/01/21 2015 03/02/21 0529 03/02/21 0842  BP: 135/61 (!) 133/56 (!) 155/70 (!) 159/70  Pulse: 78 81 80 76  Resp: 17 16 16 18   Temp: 98.6 F (37 C) 98.3 F (36.8 C) 97.6 F (36.4 C)   TempSrc: Oral Oral Oral   SpO2: 99% 98% 99% 100%  Weight:   66.2 kg   Height:        Intake/Output Summary (Last 24 hours) at 03/02/2021 1216 Last data filed at 03/02/2021 1058 Gross per 24 hour  Intake 1200 ml  Output 1300 ml  Net -100 ml   Filed Weights   02/28/21 0336 03/01/21 0334 03/02/21 0529  Weight: 68.6 kg 68.7 kg 66.2 kg    Examination:   General: Not in pain or dyspnea  Neurology: Awake and alert, non focal  E ENT: no pallor, no icterus, oral mucosa moist Cardiovascular: No JVD. S1-S2 present, rhythmic, no gallops, rubs, or murmurs. No lower extremity edema. Pulmonary:  positive breath sounds bilaterally,  no wheezing, rhonchi or rales. Gastrointestinal. Abdomen soft and non tender Skin. No rashes Musculoskeletal: no joint deformities     Data Reviewed: I have personally reviewed following labs and imaging studies  CBC: Recent Labs  Lab 02/24/21 0210 02/24/21 0316 02/26/21 0151 02/27/21 0502  WBC 6.0  --  4.7 4.9  NEUTROABS 4.8  --  3.4 3.4  HGB 9.7* 9.5* 8.5* 7.9*  HCT 30.3* 28.0* 26.4* 24.3*  MCV 94.1  --  91.3 91.4  PLT 298  --  268 751   Basic Metabolic Panel: Recent Labs  Lab 02/26/21 0151 02/27/21 0502 02/28/21 0359 03/01/21 0323 03/02/21 0348  NA 136 134* 133* 131* 130*  K 3.5 3.2* 3.4* 2.9* 3.5  CL 99 99 95* 91* 91*  CO2 22 22 24  25 22   GLUCOSE 141* 105* 102* 95 127*  BUN 76* 81* 90* 97* 100*  CREATININE 4.69* 4.69* 5.33* 5.30* 5.56*  CALCIUM 9.6 9.1 9.4 9.5 9.4  MG 2.6* 2.5* 2.7* 2.7* 2.7*  PHOS 6.0* 6.6*  --   --   --    GFR: Estimated Creatinine Clearance: 8.2 mL/min (A) (by C-G formula based on SCr of 5.56 mg/dL (H)). Liver Function Tests: Recent Labs  Lab 02/26/21 0151 02/27/21 0502 02/28/21 0359 03/01/21 0323 03/02/21 0348  AST 15 13* 17 21 20   ALT 16 14 16 20 20   ALKPHOS 23* 23* 25* 21* 21*  BILITOT 0.7 0.4 0.5 0.6 0.6  PROT 6.5 5.9* 6.6 6.4* 6.5  ALBUMIN 3.1* 3.0* 3.4* 3.1* 3.3*   No results for input(s): LIPASE, AMYLASE  in the last 168 hours. No results for input(s): AMMONIA in the last 168 hours. Coagulation Profile: No results for input(s): INR, PROTIME in the last 168 hours. Cardiac Enzymes: No results for input(s): CKTOTAL, CKMB, CKMBINDEX, TROPONINI in the last 168 hours. BNP (last 3 results) No results for input(s): PROBNP in the last 8760 hours. HbA1C: No results for input(s): HGBA1C in the last 72 hours. CBG: Recent Labs  Lab 03/01/21 1659 03/01/21 1749 03/01/21 2113 03/02/21 0607 03/02/21 1124  GLUCAP 64* 132* 151* 142* 135*   Lipid Profile: No results for input(s): CHOL, HDL, LDLCALC, TRIG, CHOLHDL, LDLDIRECT in the last 72 hours. Thyroid Function Tests: No results for input(s): TSH, T4TOTAL, FREET4, T3FREE, THYROIDAB in the last 72 hours. Anemia Panel: No results for input(s): VITAMINB12, FOLATE, FERRITIN, TIBC, IRON, RETICCTPCT in the last 72 hours.    Radiology Studies: I have reviewed all of the imaging during this hospital visit personally     Scheduled Meds:  amLODipine  10 mg Oral Daily   atorvastatin  40 mg Oral Daily   darbepoetin (ARANESP) injection - NON-DIALYSIS  40 mcg Subcutaneous Q Wed-1800   famotidine  20 mg Oral Daily   fenofibrate  160 mg Oral Daily   ferrous sulfate  325 mg Oral BID WC   FLUoxetine  20 mg Oral Daily   heparin  5,000  Units Subcutaneous Q8H   hydrALAZINE  100 mg Oral Q8H   insulin aspart  0-15 Units Subcutaneous TID WC   levothyroxine  88 mcg Oral QAC breakfast   sodium chloride flush  3 mL Intravenous Q12H   torsemide  100 mg Oral BID   Continuous Infusions:  sodium chloride       LOS: 6 days        Gelene Recktenwald Gerome Apley, MD

## 2021-03-03 DIAGNOSIS — Z515 Encounter for palliative care: Secondary | ICD-10-CM

## 2021-03-03 DIAGNOSIS — Z66 Do not resuscitate: Secondary | ICD-10-CM | POA: Diagnosis not present

## 2021-03-03 DIAGNOSIS — N185 Chronic kidney disease, stage 5: Secondary | ICD-10-CM | POA: Diagnosis not present

## 2021-03-03 DIAGNOSIS — F32A Depression, unspecified: Secondary | ICD-10-CM

## 2021-03-03 DIAGNOSIS — I5031 Acute diastolic (congestive) heart failure: Secondary | ICD-10-CM | POA: Diagnosis not present

## 2021-03-03 LAB — RENAL FUNCTION PANEL
Albumin: 3.3 g/dL — ABNORMAL LOW (ref 3.5–5.0)
Anion gap: 15 (ref 5–15)
BUN: 107 mg/dL — ABNORMAL HIGH (ref 8–23)
CO2: 26 mmol/L (ref 22–32)
Calcium: 9.5 mg/dL (ref 8.9–10.3)
Chloride: 89 mmol/L — ABNORMAL LOW (ref 98–111)
Creatinine, Ser: 5.63 mg/dL — ABNORMAL HIGH (ref 0.44–1.00)
GFR, Estimated: 7 mL/min — ABNORMAL LOW (ref >60)
Glucose, Bld: 109 mg/dL — ABNORMAL HIGH (ref 70–99)
Phosphorus: 6.1 mg/dL — ABNORMAL HIGH (ref 2.5–4.6)
Potassium: 3.2 mmol/L — ABNORMAL LOW (ref 3.5–5.1)
Sodium: 130 mmol/L — ABNORMAL LOW (ref 135–145)

## 2021-03-03 LAB — MAGNESIUM: Magnesium: 2.7 mg/dL — ABNORMAL HIGH (ref 1.7–2.4)

## 2021-03-03 LAB — GLUCOSE, CAPILLARY
Glucose-Capillary: 129 mg/dL — ABNORMAL HIGH (ref 70–99)
Glucose-Capillary: 139 mg/dL — ABNORMAL HIGH (ref 70–99)

## 2021-03-03 MED ORDER — TORSEMIDE 100 MG PO TABS: 100.0000 mg | ORAL_TABLET | Freq: Every day | ORAL | 0 refills | Status: DC

## 2021-03-03 MED ORDER — METOLAZONE 2.5 MG PO TABS: 2.5000 mg | ORAL_TABLET | Freq: Every day | ORAL | Status: DC | PRN

## 2021-03-03 NOTE — TOC Transition Note (Signed)
Transition of Care (TOC) - CM/SW Discharge Note Marvetta Gibbons RN, BSN Transitions of Care Unit 4E- RN Case Manager See Treatment Team for direct phone #  Weekend cross coverage for 3E  Patient Details  Name: Ashley [name] MRN: [id number] Date of Birth: 06/29/1946  Transition of Care [facility]) CM/SW Contact:  Dawayne Patricia, RN Phone Number: 03/03/2021, 11:52 AM   Clinical Narrative:    Notified by MD this am that pt has decided on Home Hospice and is stable to transition home today. Call made to [facility] to f/u on [place] referral per previous Cm note they had spoken with pt earlier.  Bobbie with [facility] will f/u with pt and family for DME needs and Home Hospice referral/needs.  1130- per Jolayne Haines pt will not have any DME for transition home with hospice, they will f/u with pt early this week for admission into Hospice services post discharge. Pt ready for discharge home today.    Final next level of care: Home w Hospice Care Barriers to Discharge: No Barriers Identified   Patient Goals and CMS Choice Patient states their goals for this hospitalization and ongoing recovery are:: return home with hospice CMS Medicare.gov Compare Post Acute Care list provided to:: Patient Choice offered to / list presented to : Patient  Discharge Placement           Home with Hospice.             Discharge Plan and Services   Discharge Planning Services: CM Consult Post Acute Care Choice: Hospice          DME Arranged: N/A DME Agency: NA         HH Agency: Hospice and [place] Date [place]: 03/03/21 Time HH Agency Contacted: 1000 Representative spoke with at [place]: [place] Determinants of Health ([place]) Interventions     Readmission Risk Interventions Readmission Risk Prevention Plan 03/03/2021  Transportation Screening Complete  Medication Review Press photographer) Complete  PCP or Specialist appointment within 3-5 days  of discharge Complete  HRI or [place] Complete  SW Recovery Care/Counseling Consult Complete  [place] Not Applicable  Some recent data might be hidden

## 2021-03-03 NOTE — Discharge Summary (Signed)
Physician Discharge Summary  Ashley [name] RWE:[id number] DOB: 11-22-1945 DOA: 02/24/2021  PCP: Unk Pinto, MD  Admit date: 02/24/2021 Discharge date: 03/03/2021  Admitted From: Home  Disposition: home with hospice services   Recommendations for Outpatient Follow-up and new medication changes:  Follow up with Dr. Melford Aase in 7 to 10 days.  Patient has declined renal replacement therapy at this point and will be discharge with hospice services.  Continue high doses of torsemide for volume control.   Home Health: hospice   Equipment/Devices: no    Discharge Condition: stable  CODE STATUS: DNR   Diet recommendation: heart healthy and renal prudent.   Brief/Interim Summary: Ashley [name] was admitted to the hospital with the working diagnosis of acute on chronic diastolic heart failure decompensation, complicated with worsening renal function, now ESRD.    75 year old female past medical history for hypertension, COPD, type 2 diabetes mellitus, heart failure, DVT, chronic kidney disease stage V, iron deficiency anemia, history of CVA, ambulatory dysfunction, anxiety/bipolar disorder and lung cancer in remission who presented with dyspnea for the last 2 to 3 weeks.  Persistent and progressive dyspnea, associated with orthopnea, wheezing and lower extremity edema.  10 pound weight gain over the last 2 weeks.  Patient has been depressed and not taking her medicines.  On her initial physical examination her blood pressure was 210/100, heart rate 90, respiratory rate 22, oxygen saturation 99% on room air, lungs decreased air movement, no rhonchi or rales, heart S1-S2, present, rhythmic, abdomen soft, positive bilateral lower extremity edema.   Sodium 134, potassium 3.7, chloride 101, bicarb 21, glucose 207, BUN 64, creatinine 4.15 BNP 2213, high sensitive troponin 92-92, white count 6.0, hemoglobin 9.7, hematocrit 30.3, platelets 298 SARS COVID-19 negative.   Urinalysis specific gravity 1.008, 6-10  white cells, 0-5 red cells, 100 protein, > 500 glucose.   Chest radiograph, bilateral hilar vascular congestion.   EKG 83 bpm, normal axis, normal intervals, sinus rhythm, Q-wave V1-V3, no significant ST segment or T wave changes.   Patient was placed on furosemide for diuresis along with metolazone.   Patient with worsening renal function, imminent need for renal replacement therapy.   07/15 family meeting with nephrology and palliative care, about renal replacement therapy.     Patient has decided to continue care as outpatient under hospice, continue to decline renal replacement therapy.   Acute decompensation of chronic diastolic heart failure.  Hypertension. Patient was admitted to the cardiac ward, she received aggressive diuresis with furosemide and metolazone. Negative fluid balance was achieved, with improvement of her dyspnea and lower extremity edema.  Recent echocardiography from 09/2020 showed left ventricular ejection fraction preserved, 60 to 65%.  Normal RV systolic function.  No significant valvular disease.   Continue blood pressure control with amlodipine and hydralazine. Volume control with torsemide.  2.  Acute kidney injury on chronic kidney disease stage V, rapidly progressive now end-stage renal disease.  Hypokalemia, hyponatremia, anion gap metabolic acidosis Her volume status improved but unfortunately continued to have worsening kidney function. At the time of her discharge her sodium was 130, potassium 3.2, chloride 89, bicarb 26, glucose 109, BUN 107, creatinine 5.63, anion gap 15, magnesium 2.7 and phosphorus 6.1.  Unfortunately she has progressed to end-stage renal disease.  She was offered renal replacement therapy that she has declined.  She understands the consequences of not pursuing renal replacement therapy including worsening volume overload, severe electrolyte disturbances and death. Palliative care service has been consulted. Patient has decided  to go  home with hospice services.  Continue with torsemide 100 mg daily, to increase to twice daily increase of worsening edema or dyspnea, as needed metolazone for severe edema or dyspnea.  Anemia of chronic renal disease, patient received aranesp and iron during her hospitalization.   3.  Type 2 diabetes mellitus.  Dyslipidemia.  Her glucose has remained stable, she did receive insulin sliding scale during her hospitalization.  Because worsening GFR will hold on insulin and oral hypoglycemic agents at home.   4.  COPD.  No acute exacerbation, continue bronchodilator therapy.  5.  Hypothyroidism.  Continue levothyroxine.  6.  Depression.  Continue lorazepam and fluoxetine.  Patient has no suicidal thoughts.  Discharge Diagnoses:  Principal Problem:   Acute CHF (congestive heart failure) (HCC) Active Problems:   COPD   Hypothyroidism   Memory difficulty   Hyperlipidemia associated with type 2 diabetes mellitus ([place])   Hypertensive urgency   CKD (chronic kidney disease) stage 5, GFR less than 15 ml/min (HCC)   DNR (do not resuscitate)   Depression    Discharge Instructions   Allergies as of 03/03/2021       Reactions   Sulfa Antibiotics Other (See Comments)   Pt states she had "extreme pain"   Sulfacetamide Sodium Other (See Comments)   Pain        Medication List     STOP taking these medications    bumetanide 1 MG tablet Commonly known as: BUMEX   chlorthalidone 25 MG tablet Commonly known as: HYGROTON   glimepiride 4 MG tablet Commonly known as: Amaryl   NovoLIN N 100 UNIT/ML injection Generic drug: insulin NPH Human   potassium chloride 10 MEQ tablet Commonly known as: Klor-Con M10       TAKE these medications    acetaminophen 500 MG tablet Commonly known as: TYLENOL Take 1,000 mg by mouth every 6 (six) hours as needed for mild pain or headache.   amLODipine 10 MG tablet Commonly known as: NORVASC Take 1 tablet (10 mg total) by mouth  daily.   atorvastatin 40 MG tablet Commonly known as: LIPITOR Take 1 tablet (40 mg total) by mouth daily.   clorazepate 7.5 MG tablet Commonly known as: Tranxene-T Take    1/2 to 1 tablet    1 or 2 x /day    ONLY if needed for  Nerves   famotidine 20 MG tablet Commonly known as: PEPCID TAKE 1 TABLET TWICE A DAY WITH MEALS FOR INDIGESTION.   fenofibrate 145 MG tablet Commonly known as: TRICOR TAKE 1 TABLET DAILY FOR TRIGLYCERIDES (BLOOD FATS)   ferrous sulfate 325 (65 FE) MG tablet TAKE 1 TABLET 2 TIMES A DAY WITH A MEAL FOR IRON DEFICIENCY What changed: See the new instructions.   FLUoxetine 20 MG capsule Commonly known as: PROZAC Take 20 mg by mouth daily.   hydrALAZINE 100 MG tablet Commonly known as: APRESOLINE Take 1 tablet (100 mg total) by mouth every 8 (eight) hours.   levothyroxine 88 MCG tablet Commonly known as: SYNTHROID TAKE 1 TABLET DAILY ON AN EMPTY STOMACH WITH ONLY WATER FOR 30 MINUTES & NO ANTACID MEDS, CALCIUM OR MAGNESIUM FOR 4 HOURS & AVOID BIOTIN   nystatin cream Commonly known as: MYCOSTATIN Apply 1 application topically daily as needed for dry skin.   OneTouch Ultra test strip Generic drug: glucose blood TEST BLOOD SUGAR 3 TIMES A DAY   polyethylene glycol 17 g packet Commonly known as: MIRALAX / GLYCOLAX Take 17 g by mouth daily.  torsemide 100 MG tablet Commonly known as: DEMADEX Take 1 tablet (100 mg total) by mouth daily. In case of worsening leg swelling, or shortness of breath take twice daily.   vitamin C 500 MG tablet Commonly known as: ASCORBIC ACID Take 500 mg by mouth daily. Take with iron/ferrous sulfate   Vitamin D 125 MCG (5000 UT) Caps Take 5,000 Units by mouth in the morning and at bedtime.        Allergies  Allergen Reactions   Sulfa Antibiotics Other (See Comments)    Pt states she had "extreme pain"   Sulfacetamide Sodium Other (See Comments)    Pain    Consultations: Nephrology Palliative  Care   Procedures/Studies: DG Chest Portable 1 View  Result Date: 02/24/2021 CLINICAL DATA:  Shortness of breath for several weeks, increasing in severity tonight EXAM: PORTABLE CHEST 1 VIEW COMPARISON:  CT 01/26/2020, radiograph 07/02/2019 cough FINDINGS: There are coarse reticular changes and architectural distortion in the right lung base which may reflect some chronic scarring seen on comparison imaging. Few surgical clips are again noted as well. There are increasing patchy consolidative opacities are seen in the lower lungs as well as trace bilateral effusions. Mild pulmonary vascular congestion and central cuffing. No pneumothorax. The aorta is calcified. The remaining cardiomediastinal contours are unremarkable. No acute osseous or soft tissue abnormality. Telemetry leads overlie the chest. IMPRESSION: 1. Chronic scarring and architectural distortion in the right lung base. 2. Increasing hazy and patchy opacities in the lower lungs, could reflect a combination of atelectasis and edema given additional features of cuffing and pulmonary vascular congestion. 3.  Aortic Atherosclerosis (ICD10-I70.0). Electronically Signed   By: Lovena Le M.D.   On: 02/24/2021 03:09   VAS [place] LOWER EXTREMITY VENOUS (DVT)  Result Date: 02/25/2021  Lower Venous DVT Study Patient Name:  EMBERLEE SORTINO Werst  Date of Exam:   02/24/2021 Medical Rec #: [id number]     Accession #:    [id number] Date of Birth: 1945-11-27     Patient Gender: F Patient Age:   42Y Exam Location:  [facility] Procedure:      VAS [place] LOWER EXTREMITY VENOUS (DVT) Referring Phys: [id number] Geraldine --------------------------------------------------------------------------------  Indications: Edema, and SOB.  Risk Factors: Non compliant with medication. Comparison Study: Prior negative venous duplex done 11/07/20 and 10/27/20 Performing Technologist: Sharion Dove RVS  Examination Guidelines: A complete evaluation includes B-mode imaging, spectral  Doppler, color Doppler, and power Doppler as needed of all accessible portions of each vessel. Bilateral testing is considered an integral part of a complete examination. Limited examinations for reoccurring indications may be performed as noted. The reflux portion of the exam is performed with the patient in reverse Trendelenburg.  +---------+---------------+---------+-----------+----------+--------------+ RIGHT    CompressibilityPhasicitySpontaneityPropertiesThrombus Aging +---------+---------------+---------+-----------+----------+--------------+ CFV      Full           Yes      Yes                                 +---------+---------------+---------+-----------+----------+--------------+ SFJ      Full                                                        +---------+---------------+---------+-----------+----------+--------------+ FV Prox  Full                                                        +---------+---------------+---------+-----------+----------+--------------+  FV Mid   Full                                                        +---------+---------------+---------+-----------+----------+--------------+ FV DistalFull                                                        +---------+---------------+---------+-----------+----------+--------------+ PFV      Full                                                        +---------+---------------+---------+-----------+----------+--------------+ POP      Full           Yes      Yes                                 +---------+---------------+---------+-----------+----------+--------------+ PTV      Full                                                        +---------+---------------+---------+-----------+----------+--------------+ PERO     Full                                                        +---------+---------------+---------+-----------+----------+--------------+    +---------+---------------+---------+-----------+----------+--------------+ LEFT     CompressibilityPhasicitySpontaneityPropertiesThrombus Aging +---------+---------------+---------+-----------+----------+--------------+ CFV      Full           Yes      Yes                                 +---------+---------------+---------+-----------+----------+--------------+ SFJ      Full                                                        +---------+---------------+---------+-----------+----------+--------------+ FV Prox  Full                                                        +---------+---------------+---------+-----------+----------+--------------+ FV Mid   Full                                                        +---------+---------------+---------+-----------+----------+--------------+  FV DistalFull                                                        +---------+---------------+---------+-----------+----------+--------------+ PFV      Full                                                        +---------+---------------+---------+-----------+----------+--------------+ POP      Full           Yes      Yes                                 +---------+---------------+---------+-----------+----------+--------------+ PTV      Full                                                        +---------+---------------+---------+-----------+----------+--------------+ PERO     Full                                                        +---------+---------------+---------+-----------+----------+--------------+     Summary: RIGHT: interstitial edema noted throughout  LEFT: Interstitial edema noted throughout.  *See table(s) above for measurements and observations. Electronically signed by Monica Martinez MD on 02/25/2021 at 10:23:19 AM.    Final        Subjective: Patient is feeling better, no nausea or vomiting, no dyspnea at rest.   Discharge  Exam: Vitals:   03/02/21 2112 03/03/21 0413  BP: (!) 145/63 (!) 169/79  Pulse: 81 80  Resp: 18 18  Temp: 98.7 F (37.1 C) 97.9 F (36.6 C)  SpO2: 100% 97%   Vitals:   03/02/21 1450 03/02/21 2112 03/03/21 0410 03/03/21 0413  BP: (!) 159/71 (!) 145/63  (!) 169/79  Pulse:  81  80  Resp:  18  18  Temp:  98.7 F (37.1 C)  97.9 F (36.6 C)  TempSrc:  Oral  Oral  SpO2:  100%  97%  Weight:   64.3 kg   Height:        General: Not in pain or dyspnea,  Neurology: Awake and alert, non focal  E ENT: mild pallor, no icterus, oral mucosa moist Cardiovascular: No JVD. S1-S2 present, rhythmic, no gallops, rubs, or murmurs. Trace bilateral lower extremity edema. Pulmonary: positive breath sounds bilaterally,  no wheezing, rhonchi or rales. Gastrointestinal. Abdomen soft and non tender Skin. No rashes Musculoskeletal: no joint deformities   The results of significant diagnostics from this hospitalization (including imaging, microbiology, ancillary and laboratory) are listed below for reference.     Microbiology: Recent Results (from the past 240 hour(s))  Resp Panel by RT-PCR (Flu A&B, Covid) Nasopharyngeal Swab     Status: None   Collection Time: 02/24/21  3:04 AM   Specimen: Nasopharyngeal  Swab; Nasopharyngeal(NP) swabs in vial transport medium  Result Value Ref Range Status   SARS Coronavirus 2 by RT PCR NEGATIVE NEGATIVE Final    Comment: (NOTE) SARS-CoV-2 target nucleic acids are NOT DETECTED.  The SARS-CoV-2 RNA is generally detectable in upper respiratory specimens during the acute phase of infection. The lowest concentration of SARS-CoV-2 viral copies this assay can detect is 138 copies/mL. A negative result does not preclude SARS-Cov-2 infection and should not be used as the sole basis for treatment or other patient management decisions. A negative result may occur with  improper specimen collection/handling, submission of specimen other than nasopharyngeal swab, presence  of viral mutation(s) within the areas targeted by this assay, and inadequate number of viral copies(<138 copies/mL). A negative result must be combined with clinical observations, patient history, and epidemiological information. The expected result is Negative.  Fact Sheet for Patients:  EntrepreneurPulse.com.au  Fact Sheet for Healthcare Providers:  IncredibleEmployment.be  This test is no t yet approved or cleared by the [place] FDA and  has been authorized for detection and/or diagnosis of SARS-CoV-2 by FDA under an Emergency Use Authorization (EUA). This EUA will remain  in effect (meaning this test can be used) for the duration of the COVID-19 declaration under Section 564(b)(1) of the Act, 21 U.S.C.section 360bbb-3(b)(1), unless the authorization is terminated  or revoked sooner.       Influenza A by PCR NEGATIVE NEGATIVE Final   Influenza B by PCR NEGATIVE NEGATIVE Final    Comment: (NOTE) The Xpert Xpress SARS-CoV-2/FLU/RSV plus assay is intended as an aid in the diagnosis of influenza from Nasopharyngeal swab specimens and should not be used as a sole basis for treatment. Nasal washings and aspirates are unacceptable for Xpert Xpress SARS-CoV-2/FLU/RSV testing.  Fact Sheet for Patients: EntrepreneurPulse.com.au  Fact Sheet for Healthcare Providers: IncredibleEmployment.be  This test is not yet approved or cleared by the [place] FDA and has been authorized for detection and/or diagnosis of SARS-CoV-2 by FDA under an Emergency Use Authorization (EUA). This EUA will remain in effect (meaning this test can be used) for the duration of the COVID-19 declaration under Section 564(b)(1) of the Act, 21 U.S.C. section 360bbb-3(b)(1), unless the authorization is terminated or revoked.  Performed at [facility], [place] [address] [place], [place] [address]      Labs: BNP (last 3  results) Recent Labs    02/24/21 0211  BNP 3,976.7*   Basic Metabolic Panel: Recent Labs  Lab 02/26/21 0151 02/27/21 0502 02/28/21 0359 03/01/21 0323 03/02/21 0348 03/03/21 0217  NA 136 134* 133* 131* 130* 130*  K 3.5 3.2* 3.4* 2.9* 3.5 3.2*  CL 99 99 95* 91* 91* 89*  CO2 22 22 24 25 22 26   GLUCOSE 141* 105* 102* 95 127* 109*  BUN 76* 81* 90* 97* 100* 107*  CREATININE 4.69* 4.69* 5.33* 5.30* 5.56* 5.63*  CALCIUM 9.6 9.1 9.4 9.5 9.4 9.5  MG 2.6* 2.5* 2.7* 2.7* 2.7* 2.7*  PHOS 6.0* 6.6*  --   --   --  6.1*   Liver Function Tests: Recent Labs  Lab 02/26/21 0151 02/27/21 0502 02/28/21 0359 03/01/21 0323 03/02/21 0348 03/03/21 0217  AST 15 13* 17 21 20   --   ALT 16 14 16 20 20   --   ALKPHOS 23* 23* 25* 21* 21*  --   BILITOT 0.7 0.4 0.5 0.6 0.6  --   PROT 6.5 5.9* 6.6 6.4* 6.5  --   ALBUMIN 3.1* 3.0*  3.4* 3.1* 3.3* 3.3*   No results for input(s): LIPASE, AMYLASE in the last 168 hours. No results for input(s): AMMONIA in the last 168 hours. CBC: Recent Labs  Lab 02/26/21 0151 02/27/21 0502  WBC 4.7 4.9  NEUTROABS 3.4 3.4  HGB 8.5* 7.9*  HCT 26.4* 24.3*  MCV 91.3 91.4  PLT 268 269   Cardiac Enzymes: No results for input(s): CKTOTAL, CKMB, CKMBINDEX, TROPONINI in the last 168 hours. BNP: Invalid input(s): POCBNP CBG: Recent Labs  Lab 03/02/21 0607 03/02/21 1124 03/02/21 1532 03/02/21 2114 03/03/21 0600  GLUCAP 142* 135* 199* 98 129*   D-Dimer No results for input(s): DDIMER in the last 72 hours. Hgb A1c No results for input(s): HGBA1C in the last 72 hours. Lipid Profile No results for input(s): CHOL, HDL, LDLCALC, TRIG, CHOLHDL, LDLDIRECT in the last 72 hours. Thyroid function studies No results for input(s): TSH, T4TOTAL, T3FREE, THYROIDAB in the last 72 hours.  Invalid input(s): FREET3 Anemia work up No results for input(s): VITAMINB12, FOLATE, FERRITIN, TIBC, IRON, RETICCTPCT in the last 72 hours. Urinalysis    Component Value Date/Time    COLORURINE STRAW (A) 02/24/2021 0749   APPEARANCEUR CLEAR 02/24/2021 0749   LABSPEC 1.008 02/24/2021 0749   PHURINE 5.0 02/24/2021 0749   GLUCOSEU >=500 (A) 02/24/2021 0749   HGBUR NEGATIVE 02/24/2021 0749   BILIRUBINUR NEGATIVE 02/24/2021 0749   KETONESUR NEGATIVE 02/24/2021 0749   PROTEINUR 100 (A) 02/24/2021 0749   UROBILINOGEN 0.2 10/26/2014 1610   NITRITE NEGATIVE 02/24/2021 0749   LEUKOCYTESUR TRACE (A) 02/24/2021 0749   Sepsis Labs Invalid input(s): PROCALCITONIN,  WBC,  LACTICIDVEN Microbiology Recent Results (from the past 240 hour(s))  Resp Panel by RT-PCR (Flu A&B, Covid) Nasopharyngeal Swab     Status: None   Collection Time: 02/24/21  3:04 AM   Specimen: Nasopharyngeal Swab; Nasopharyngeal(NP) swabs in vial transport medium  Result Value Ref Range Status   SARS Coronavirus 2 by RT PCR NEGATIVE NEGATIVE Final    Comment: (NOTE) SARS-CoV-2 target nucleic acids are NOT DETECTED.  The SARS-CoV-2 RNA is generally detectable in upper respiratory specimens during the acute phase of infection. The lowest concentration of SARS-CoV-2 viral copies this assay can detect is 138 copies/mL. A negative result does not preclude SARS-Cov-2 infection and should not be used as the sole basis for treatment or other patient management decisions. A negative result may occur with  improper specimen collection/handling, submission of specimen other than nasopharyngeal swab, presence of viral mutation(s) within the areas targeted by this assay, and inadequate number of viral copies(<138 copies/mL). A negative result must be combined with clinical observations, patient history, and epidemiological information. The expected result is Negative.  Fact Sheet for Patients:  EntrepreneurPulse.com.au  Fact Sheet for Healthcare Providers:  IncredibleEmployment.be  This test is no t yet approved or cleared by the [place] FDA and  has been authorized for  detection and/or diagnosis of SARS-CoV-2 by FDA under an Emergency Use Authorization (EUA). This EUA will remain  in effect (meaning this test can be used) for the duration of the COVID-19 declaration under Section 564(b)(1) of the Act, 21 U.S.C.section 360bbb-3(b)(1), unless the authorization is terminated  or revoked sooner.       Influenza A by PCR NEGATIVE NEGATIVE Final   Influenza B by PCR NEGATIVE NEGATIVE Final    Comment: (NOTE) The Xpert Xpress SARS-CoV-2/FLU/RSV plus assay is intended as an aid in the diagnosis of influenza from Nasopharyngeal swab specimens and should not be used as  a sole basis for treatment. Nasal washings and aspirates are unacceptable for Xpert Xpress SARS-CoV-2/FLU/RSV testing.  Fact Sheet for Patients: EntrepreneurPulse.com.au  Fact Sheet for Healthcare Providers: IncredibleEmployment.be  This test is not yet approved or cleared by the [place] FDA and has been authorized for detection and/or diagnosis of SARS-CoV-2 by FDA under an Emergency Use Authorization (EUA). This EUA will remain in effect (meaning this test can be used) for the duration of the COVID-19 declaration under Section 564(b)(1) of the Act, 21 U.S.C. section 360bbb-3(b)(1), unless the authorization is terminated or revoked.  Performed at [facility], [place] [address] [place], [place] [address]      Time coordinating discharge: 45 minutes  SIGNED:   Tawni Millers, MD  [facility] 03/03/2021, 9:51 AM

## 2021-03-03 NOTE — Plan of Care (Signed)
  Problem: Pain Managment: ?Goal: General experience of comfort will improve ?Outcome: Completed/Met ?  ?Problem: Nutrition: ?Goal: Adequate nutrition will be maintained ?Outcome: Completed/Met ?  ?

## 2021-03-03 NOTE — Progress Notes (Signed)
   Palliative Medicine Inpatient Follow Up Note  I went to bedside to assess Ashley [name] this afternoon though she had already been discharged.   Per chart review Ashley [name] did opt for the pursuit of hospice care through [facility].  No Charge __________________________________________________________________________________ [place] Team Team Cell Phone: [phone] Please utilize secure chat with additional questions, if there is no response within 30 minutes please call the above phone number  Palliative Medicine Team providers are available by phone from 7am to 7pm daily and can be reached through the team cell phone.  Should this patient require assistance outside of these hours, please call the patient's attending physician.

## 2021-03-03 NOTE — Progress Notes (Signed)
[place] [facility]) Hospital Liaison RN Note  Received request from Marvetta Gibbons, RN Transitions of Care Manager for hospice services at home after discharge. Patient has had multiple conversations with [facility] hospital liaisons and has now decided to proceed with hospices services at home. Chart and patient information reviewed by [facility] physician. Hospice eligibility confirmed.   Spoke with patient to provide education related to hospice philosophy, services and team approach to care. Patient verbalized understanding of information given. Per discussion, plan is for discharge home by private vehicle on 7.17.22.   DME needs discussed. Patient has a w/c, standard walker and cane at home. No current DME needs at this time. Address has been verified and is correct in the chart.   Please send signed and completed DNR home with patient/family. Please provide prescriptions at discharge as needed to ensure ongoing symptom management.   [facility] information and contact numbers given to patient.  Please call with any questions or concerns.   Thank you for the opportunity to participate in this patient's care.   Bobbie "Loren Racer, RN, BSN [facility] Liaison [phone]

## 2021-03-03 NOTE — Progress Notes (Signed)
[facility] Progress Note   Subjective:   UOP 752mL yesterday on oral diuretics.  Has decided home with hospice and seems at peace with this decision.   Objective Vitals:   03/02/21 1450 03/02/21 2112 03/03/21 0410 03/03/21 0413  BP: (!) 159/71 (!) 145/63  (!) 169/79  Pulse:  81  80  Resp:  18  18  Temp:  98.7 F (37.1 C)  97.9 F (36.6 C)  TempSrc:  Oral  Oral  SpO2:  100%  97%  Weight:   64.3 kg   Height:       Physical Exam General: chronically ill and anxious - not far from baseline  Heart:RRR, no rub Lungs: normal WOB, no rales, a few exp wheezes Abdomen:soft Extremities:resolved LE edema   Additional Objective Labs: Basic Metabolic Panel: Recent Labs  Lab 02/26/21 0151 02/27/21 0502 02/28/21 0359 03/01/21 0323 03/02/21 0348 03/03/21 0217  NA 136 134*   < > 131* 130* 130*  K 3.5 3.2*   < > 2.9* 3.5 3.2*  CL 99 99   < > 91* 91* 89*  CO2 22 22   < > 25 22 26   GLUCOSE 141* 105*   < > 95 127* 109*  BUN 76* 81*   < > 97* 100* 107*  CREATININE 4.69* 4.69*   < > 5.30* 5.56* 5.63*  CALCIUM 9.6 9.1   < > 9.5 9.4 9.5  PHOS 6.0* 6.6*  --   --   --  6.1*   < > = values in this interval not displayed.    Liver Function Tests: Recent Labs  Lab 02/28/21 0359 03/01/21 0323 03/02/21 0348 03/03/21 0217  AST 17 21 20   --   ALT 16 20 20   --   ALKPHOS 25* 21* 21*  --   BILITOT 0.5 0.6 0.6  --   PROT 6.6 6.4* 6.5  --   ALBUMIN 3.4* 3.1* 3.3* 3.3*    No results for input(s): LIPASE, AMYLASE in the last 168 hours. CBC: Recent Labs  Lab 02/26/21 0151 02/27/21 0502  WBC 4.7 4.9  NEUTROABS 3.4 3.4  HGB 8.5* 7.9*  HCT 26.4* 24.3*  MCV 91.3 91.4  PLT 268 269    Blood Culture    Component Value Date/Time   SDES  11/07/2020 1255    Urine Performed at [facility], [place] [address], [place], [place] [address]    SPECREQUEST  11/07/2020 1255    NONE Performed at [facility], [place] [address]  [place], [place] [address]    CULT >=100,000 COLONIES/mL KLEBSIELLA PNEUMONIAE (A) 11/07/2020 1255   REPTSTATUS 11/10/2020 FINAL 11/07/2020 1255    Cardiac Enzymes: No results for input(s): CKTOTAL, CKMB, CKMBINDEX, TROPONINI in the last 168 hours. CBG: Recent Labs  Lab 03/02/21 0607 03/02/21 1124 03/02/21 1532 03/02/21 2114 03/03/21 0600  GLUCAP 142* 135* 199* 98 129*    Iron Studies: No results for input(s): IRON, TIBC, TRANSFERRIN, FERRITIN in the last 72 hours.  @lablastinr3 @ Studies/Results: No results found. Medications:  sodium chloride      amLODipine  10 mg Oral Daily   atorvastatin  40 mg Oral Daily   darbepoetin (ARANESP) injection - NON-DIALYSIS  40 mcg Subcutaneous Q Wed-1800   famotidine  20 mg Oral Daily   fenofibrate  160 mg Oral Daily   ferrous sulfate  325 mg Oral BID WC   FLUoxetine  20 mg Oral Daily   heparin  5,000 Units Subcutaneous Q8H  hydrALAZINE  100 mg Oral Q8H   insulin aspart  0-15 Units Subcutaneous TID WC   levothyroxine  88 mcg Oral QAC breakfast   sodium chloride flush  3 mL Intravenous Q12H   torsemide  100 mg Oral Daily    Assessment/Recommendations:    CKD5, stable: Cr seems to be around 4. Underlying etiology of CKD likely secondary to DM and HTN -Worsening renal function in face of obligatory diuresis.   -Recommended initiation of dialysis or palliative care/hospice.  We've had long discussions with family, palliative care through the week -Decided home with hospice which I am supportive of - would d/c with torsemide 100 daily with 2nd 100mg  dose for wt gain; would consider this a comfort med.      Acute on chronic dCHF exacerbation: presented with symptoms of orthopnea and dyspnea, wt gain.  - euvolemic now -discharge would be on torsemide  per above   Hypertension:  per hospice   Dispo: home with hospice; nephrology won't continue to follow but please don't hesitate to contact me directly if I can help with anything (I followed  her outpt over the past few years)  Jannifer Hick MD 03/03/2021, 10:14 AM  [facility] Pager: [phone]

## 2021-03-26 ENCOUNTER — Telehealth: Payer: Self-pay | Admitting: Internal Medicine

## 2021-03-26 NOTE — Telephone Encounter (Signed)
patient's brother called to report patient is falling more frequently, daily. Patient unable to maintain balance, very unsteady, falls upon standing. He states patient slid into floor today when attemping to sit on toilet. Hospice meeting tomorrow to discuss, changing needs in care. Confirmed cain, walker, and wheelchair are in home. Encouraged use. Confirmed Bedside commode, and DME Hospital Bed in home. Hospice to guide family in safely using bedside commode, make any additional recommendations for comfort care, and answer all questions at home meeting. I called, left voicemail for [employer] case manager, Chong Sicilian, to please call our office to review plan of care and how PCP can assist.

## 2021-03-27 ENCOUNTER — Other Ambulatory Visit: Payer: Self-pay

## 2021-03-27 MED ORDER — TORSEMIDE 100 MG PO TABS: 100.0000 mg | ORAL_TABLET | Freq: Every day | ORAL | 0 refills | Status: DC

## 2021-04-04 ENCOUNTER — Other Ambulatory Visit: Payer: Self-pay | Admitting: Internal Medicine

## 2021-04-19 ENCOUNTER — Emergency Department (HOSPITAL_COMMUNITY)

## 2021-04-19 ENCOUNTER — Emergency Department (HOSPITAL_COMMUNITY)
Admission: EM | Admit: 2021-04-19 | Discharge: 2021-04-19 | Disposition: A | Discharge status: Home / Self Care | Attending: Emergency Medicine | Admitting: Emergency Medicine

## 2021-04-19 ENCOUNTER — Other Ambulatory Visit: Payer: Self-pay

## 2021-04-19 DIAGNOSIS — F17210 Nicotine dependence, cigarettes, uncomplicated: Secondary | ICD-10-CM | POA: Insufficient documentation

## 2021-04-19 DIAGNOSIS — R531 Weakness: Secondary | ICD-10-CM | POA: Insufficient documentation

## 2021-04-19 DIAGNOSIS — I509 Heart failure, unspecified: Secondary | ICD-10-CM | POA: Diagnosis not present

## 2021-04-19 DIAGNOSIS — E039 Hypothyroidism, unspecified: Secondary | ICD-10-CM | POA: Insufficient documentation

## 2021-04-19 DIAGNOSIS — J45909 Unspecified asthma, uncomplicated: Secondary | ICD-10-CM | POA: Insufficient documentation

## 2021-04-19 DIAGNOSIS — I132 Hypertensive heart and chronic kidney disease with heart failure and with stage 5 chronic kidney disease, or end stage renal disease: Secondary | ICD-10-CM | POA: Insufficient documentation

## 2021-04-19 DIAGNOSIS — J449 Chronic obstructive pulmonary disease, unspecified: Secondary | ICD-10-CM | POA: Insufficient documentation

## 2021-04-19 DIAGNOSIS — Y900 Blood alcohol level of less than 20 mg/100 ml: Secondary | ICD-10-CM | POA: Diagnosis not present

## 2021-04-19 DIAGNOSIS — E876 Hypokalemia: Secondary | ICD-10-CM | POA: Insufficient documentation

## 2021-04-19 DIAGNOSIS — Z794 Long term (current) use of insulin: Secondary | ICD-10-CM | POA: Insufficient documentation

## 2021-04-19 DIAGNOSIS — Z85118 Personal history of other malignant neoplasm of bronchus and lung: Secondary | ICD-10-CM | POA: Insufficient documentation

## 2021-04-19 DIAGNOSIS — E1122 Type 2 diabetes mellitus with diabetic chronic kidney disease: Secondary | ICD-10-CM | POA: Insufficient documentation

## 2021-04-19 DIAGNOSIS — Z20822 Contact with and (suspected) exposure to covid-19: Secondary | ICD-10-CM | POA: Diagnosis not present

## 2021-04-19 DIAGNOSIS — Z8544 Personal history of malignant neoplasm of other female genital organs: Secondary | ICD-10-CM | POA: Diagnosis not present

## 2021-04-19 DIAGNOSIS — N185 Chronic kidney disease, stage 5: Secondary | ICD-10-CM | POA: Insufficient documentation

## 2021-04-19 DIAGNOSIS — Z79899 Other long term (current) drug therapy: Secondary | ICD-10-CM | POA: Diagnosis not present

## 2021-04-19 LAB — COMPREHENSIVE METABOLIC PANEL
ALT: 12 U/L (ref 0–44)
AST: 14 U/L — ABNORMAL LOW (ref 15–41)
Albumin: 3.4 g/dL — ABNORMAL LOW (ref 3.5–5.0)
Alkaline Phosphatase: 30 U/L — ABNORMAL LOW (ref 38–126)
Anion gap: 19 — ABNORMAL HIGH (ref 5–15)
BUN: 99 mg/dL — ABNORMAL HIGH (ref 8–23)
CO2: 23 mmol/L (ref 22–32)
Calcium: 9.7 mg/dL (ref 8.9–10.3)
Chloride: 93 mmol/L — ABNORMAL LOW (ref 98–111)
Creatinine, Ser: 5.41 mg/dL — ABNORMAL HIGH (ref 0.44–1.00)
GFR, Estimated: 8 mL/min — ABNORMAL LOW (ref >60)
Glucose, Bld: 187 mg/dL — ABNORMAL HIGH (ref 70–99)
Potassium: 2.5 mmol/L — CL (ref 3.5–5.1)
Sodium: 135 mmol/L (ref 135–145)
Total Bilirubin: 1.2 mg/dL (ref 0.3–1.2)
Total Protein: 6.7 g/dL (ref 6.5–8.1)

## 2021-04-19 LAB — DIFFERENTIAL
Abs Immature Granulocytes: 0.03 10*3/uL (ref 0.00–0.07)
Basophils Absolute: 0 10*3/uL (ref 0.0–0.1)
Basophils Relative: 1 %
Eosinophils Absolute: 0.1 10*3/uL (ref 0.0–0.5)
Eosinophils Relative: 1 %
Immature Granulocytes: 1 %
Lymphocytes Relative: 9 %
Lymphs Abs: 0.6 10*3/uL — ABNORMAL LOW (ref 0.7–4.0)
Monocytes Absolute: 0.6 10*3/uL (ref 0.1–1.0)
Monocytes Relative: 10 %
Neutro Abs: 5.2 10*3/uL (ref 1.7–7.7)
Neutrophils Relative %: 78 %

## 2021-04-19 LAB — URINALYSIS, ROUTINE W REFLEX MICROSCOPIC
Bilirubin Urine: NEGATIVE
Glucose, UA: 50 mg/dL — AB
Hgb urine dipstick: NEGATIVE
Ketones, ur: NEGATIVE mg/dL
Leukocytes,Ua: NEGATIVE
Nitrite: NEGATIVE
Protein, ur: 100 mg/dL — AB
Specific Gravity, Urine: 1.011 (ref 1.005–1.030)
pH: 6 (ref 5.0–8.0)

## 2021-04-19 LAB — PROTIME-INR
INR: 1 (ref 0.8–1.2)
Prothrombin Time: 13.6 seconds (ref 11.4–15.2)

## 2021-04-19 LAB — CBC
HCT: 28.6 % — ABNORMAL LOW (ref 36.0–46.0)
Hemoglobin: 9.6 g/dL — ABNORMAL LOW (ref 12.0–15.0)
MCH: 30 pg (ref 26.0–34.0)
MCHC: 33.6 g/dL (ref 30.0–36.0)
MCV: 89.4 fL (ref 80.0–100.0)
Platelets: 344 10*3/uL (ref 150–400)
RBC: 3.2 MIL/uL — ABNORMAL LOW (ref 3.87–5.11)
RDW: 13.2 % (ref 11.5–15.5)
WBC: 6.6 10*3/uL (ref 4.0–10.5)
nRBC: 0 % (ref 0.0–0.2)

## 2021-04-19 LAB — RESP PANEL BY RT-PCR (FLU A&B, COVID) ARPGX2
Influenza A by PCR: NEGATIVE
Influenza B by PCR: NEGATIVE
SARS Coronavirus 2 by RT PCR: NEGATIVE

## 2021-04-19 LAB — ETHANOL: Alcohol, Ethyl (B): <10 mg/dL (ref <10)

## 2021-04-19 LAB — I-STAT CHEM 8, ED
BUN: 109 mg/dL — ABNORMAL HIGH (ref 8–23)
Calcium, Ion: 1.12 mmol/L — ABNORMAL LOW (ref 1.15–1.40)
Chloride: 94 mmol/L — ABNORMAL LOW (ref 98–111)
Creatinine, Ser: 5.6 mg/dL — ABNORMAL HIGH (ref 0.44–1.00)
Glucose, Bld: 186 mg/dL — ABNORMAL HIGH (ref 70–99)
HCT: 29 % — ABNORMAL LOW (ref 36.0–46.0)
Hemoglobin: 9.9 g/dL — ABNORMAL LOW (ref 12.0–15.0)
Potassium: 2.6 mmol/L — CL (ref 3.5–5.1)
Sodium: 133 mmol/L — ABNORMAL LOW (ref 135–145)
TCO2: 27 mmol/L (ref 22–32)

## 2021-04-19 LAB — RAPID URINE DRUG SCREEN, HOSP PERFORMED
Amphetamines: NOT DETECTED
Barbiturates: NOT DETECTED
Benzodiazepines: POSITIVE — AB
Cocaine: NOT DETECTED
Opiates: NOT DETECTED
Tetrahydrocannabinol: NOT DETECTED

## 2021-04-19 LAB — APTT: aPTT: 28 seconds (ref 24–36)

## 2021-04-19 MED ORDER — POTASSIUM CHLORIDE 20 MEQ PO PACK
40.0000 meq | PACK | Freq: Every day | ORAL | Status: DC
  Administered 2021-04-19: 40 meq via ORAL
  Filled 2021-04-19: qty 2

## 2021-04-19 MED ORDER — DIAZEPAM 2 MG PO TABS: 2.0000 mg | ORAL_TABLET | Freq: Once | ORAL | Status: DC | PRN

## 2021-04-19 MED ORDER — POTASSIUM CHLORIDE 10 MEQ/100ML IV SOLN
10.0000 meq | INTRAVENOUS | Status: DC
  Administered 2021-04-19: 10 meq via INTRAVENOUS

## 2021-04-19 MED ORDER — IPRATROPIUM BROMIDE 0.02 % IN SOLN
0.5000 mg | Freq: Once | RESPIRATORY_TRACT | Status: AC
  Administered 2021-04-19: 0.5 mg via RESPIRATORY_TRACT
  Filled 2021-04-19: qty 2.5

## 2021-04-19 MED ORDER — POTASSIUM CHLORIDE 10 MEQ/100ML IV SOLN
10.0000 meq | INTRAVENOUS | Status: AC
  Administered 2021-04-19 (×2): 10 meq via INTRAVENOUS
  Filled 2021-04-19 (×3): qty 100

## 2021-04-19 MED ORDER — ALBUTEROL SULFATE (2.5 MG/3ML) 0.083% IN NEBU
2.5000 mg | INHALATION_SOLUTION | Freq: Once | RESPIRATORY_TRACT | Status: AC
  Administered 2021-04-19: 2.5 mg via RESPIRATORY_TRACT
  Filled 2021-04-19: qty 3

## 2021-04-19 NOTE — ED Notes (Addendum)
Pt states she is having a hard time breathing. Pt adjusted in bed. Lung sounds wheezy with crackles. SpO2 96-98% RA .Josh, PA made aware.

## 2021-04-19 NOTE — ED Notes (Signed)
Pt able to assist with bending knees and lifting hips to get on bedpan.

## 2021-04-19 NOTE — Social Work (Signed)
CSW provided brother, Fritz Pickerel, with [employer] list of in-home care providers. CSW did call several providers in an effort to assist family as it is a holiday weekend and 2 agencies had potential to offer care.  CSW highlighted these on list and have brother specific instructions to reach out to these 2 agencies for further assistance.

## 2021-04-19 NOTE — ED Triage Notes (Signed)
Pt here via EMS with complaints of left sided weakness. LSN 2100 04/18/21. Alert and oriented X4.

## 2021-04-19 NOTE — Care Management (Signed)
ED [facility] team met with patient and brother Kaleen Mask [facility] [phone], after reviewing patient's record to discuss transitions of care options, because patient does not meet criteria for inpatient admission.  Patient recently contacted [facility] for in-home care but declined at the time but now feels she is ready to accept in home care.  Will continue to assist patient and family with transitioning  patient home.

## 2021-04-19 NOTE — ED Notes (Signed)
Pt placed on bedpan

## 2021-04-19 NOTE — ED Notes (Signed)
Pt sitting up eating tray

## 2021-04-19 NOTE — Discharge Instructions (Addendum)
Please continue taking her medications as prescribed specifically your potassium tablets.  Please follow-up with your primary care provider.  Have also given you the information for neurology to follow-up with.  If you are already established with a neurologist he may follow-up with them instead.  You may always return to the ER for any new symptoms  You have been provided the information for home health groups.  Please contact these to set up home health.  Please follow-up with your primary care provider.

## 2021-04-19 NOTE — Discharge Planning (Signed)
RNCM notified by [facility] that their Medical Direction (Dr Amalia Hailey) will approve skilled nursing facility (SNF) placement from ED if pt stable for transfer.

## 2021-04-19 NOTE — ED Provider Notes (Signed)
[facility] EMERGENCY DEPARTMENT Provider Note   CSN: [id number] Arrival date & time: 04/19/21  1002     History Chief Complaint  Patient presents with   Weakness    Ashley [name] is a 75 y.o. female.  75 year old female past medical history for hypertension, COPD, type 2 diabetes mellitus, heart failure, DVT, chronic kidney disease stage V, iron deficiency anemia, history of CVA, ambulatory dysfunction, anxiety/bipolar disorder and lung cancer in remission --presents to the emergency department for evaluation of left-sided weakness involving the arm and leg as well as garbled speech, patient states started around 8:30 PM last night.  She states that symptoms persisted this morning and she called her brother who drove up from [place].  EMS was called for transport.  Patient denies vision changes.  No aphasia, neglect reported.  Patient denies headache.  The onset of this condition was acute. The course is constant. Aggravating factors: none. Alleviating factors: none.        Past Medical History:  Diagnosis Date   Anxiety disorder    Arthritis LOWER BACK   Asthma    Bipolar 1 disorder (HCC)    Carpal tunnel syndrome    CKD (chronic kidney disease), stage IV (HCC)    stage 3   Depression    Diabetes mellitus    type 2   DVT (deep venous thrombosis) (HCC)    Dyslipidemia    Dyspnea    with much activity   Gait disorder    GERD (gastroesophageal reflux disease)    Gout LEFT FOOT-  STABLE   H/O hiatal hernia    History of CVA (cerebrovascular accident) FOUND PER MRI 1994--  RESIDUAL MEMORY IMPAIRED   Hypercholesteremia    Memory difficulty 08/14/2016   OCD (obsessive compulsive disorder)    Stroke ([place])    2000 memory loss    SUI (stress urinary incontinence, female)    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    Patient Active Problem List   Diagnosis Date Noted   Depression 03/03/2021   Acute CHF (congestive heart failure) ([place]) 02/24/2021   Hypertensive  urgency 02/24/2021   Elevated troponin 02/24/2021   CKD (chronic kidney disease) stage 5, GFR less than 15 ml/min ([place]) 02/24/2021   DNR (do not resuscitate) 02/24/2021   Symptomatic anemia 11/07/2020   Hematoma of left thigh 11/07/2020   Acute renal failure superimposed on stage 5 chronic kidney disease, not on chronic dialysis ([place]) 10/09/2020   AKI (acute kidney injury) ([place]) 10/09/2020   Supratherapeutic INR    Constipation 08/27/2020   Generalized abdominal pain 08/27/2020   Aortic atherosclerosis ([place]) by Chest CT scan 01/2020 06/19/2020   Acute deep vein thrombosis (DVT) of femoral vein of right lower extremity ([place]) 03/29/2020   Cancer ([place]) 03/07/2020   History of lung cancer 03/07/2020   Acquired thrombophilia ([place]) 11/29/2019   Hypokalemia 06/30/2019   Right lower lobe pulmonary nodule 06/24/2019   Long term (current) use of insulin ([place]) 02/21/2019   Obesity (BMI 30.0-34.9) 01/06/2018   Diabetes mellitus type 2, uncontrolled ([place]) 12/17/2017   Peripheral arterial disease ([place]) 10/20/2017   Polyp of ascending colon    Mild nonproliferative diabetic retinopathy of both eyes without macular edema associated with type 2 diabetes mellitus ([place]) 06/09/2017   Senile purpura ([place]) 06/09/2017   Hyperlipidemia associated with type 2 diabetes mellitus ([place]) 06/08/2017   Memory difficulty 08/14/2016   Frequent falls 04/08/2016   Hypothyroidism 03/05/2016   GERD (gastroesophageal reflux disease) 03/05/2016  Gout 08/29/2015   Non compliance w medication regimen 02/20/2015   Bipolar depression ([place]) 11/06/2014   Vitamin D deficiency 12/01/2013   Medication management 12/01/2013   Abnormality of gait 03/10/2013   Carpal tunnel syndrome 10/25/2012   Diabetic neuropathy ([place]) 10/25/2012   Major depressive disorder, recurrent episode ([place]) 10/12/2009   Essential hypertension 10/12/2009   COPD 10/12/2009    Past Surgical History:  Procedure Laterality Date   BLADDER SUSPENSION   1996   CATARACT EXTRACTION     COLONOSCOPY WITH PROPOFOL N/A 10/01/2017   Procedure: COLONOSCOPY WITH PROPOFOL;  Surgeon: [name], MD;  Location: [facility];  Service: Endoscopy;  Laterality: N/A;   FUDUCIAL PLACEMENT N/A 06/24/2019   Procedure: PLACEMENT OF FUDUCIAL MARKERS;  Surgeon: Garner Nash, DO;  Location: [facility] OR;  Service: Thoracic;  Laterality: N/A;   [place]   VIDEO BRONCHOSCOPY WITH ENDOBRONCHIAL NAVIGATION N/A 06/24/2019   Procedure: VIDEO BRONCHOSCOPY WITH ENDOBRONCHIAL NAVIGATION;  Surgeon: Garner Nash, DO;  Location: [facility];  Service: Thoracic;  Laterality: N/A;   VULVAR LESION REMOVAL  06/22/2012   Procedure: VULVAR LESION;  Surgeon: Selinda Orion, MD;  [facility];  Service: Gynecology;  Laterality: N/A;  WIDE EXCISION VULVAR LESION    VULVECTOMY PARTIAL  DEC 1999     OB History   No obstetric history on file.     Family History  Problem Relation Age of Onset   Stroke Father    Heart failure Father    Heart disease Father    Hyperlipidemia Father    Hypertension Father    Hypertension Brother    Hyperlipidemia Mother    Stroke Mother    Colon cancer Neg Hx     Social History   Tobacco Use   Smoking status: Every Day    Packs/day: 2.00    Years: 27.00    Pack years: 54.00    Types: Cigarettes   Smokeless tobacco: Never   Tobacco comments:    Information on smoking cessation offered, pt refused information at this time  Vaping Use   Vaping Use: Never used  Substance Use Topics   Alcohol use: No    Alcohol/week: 0.0 standard drinks   Drug use: No    Home Medications Prior to Admission medications   Medication Sig Start Date End Date Taking? Authorizing Provider  acetaminophen (TYLENOL) 500 MG tablet Take 1,000 mg by mouth every 6 (six) hours as needed for mild pain or headache.    [provider]  amLODipine (NORVASC) 10 MG tablet  Take 1 tablet (10 mg total) by mouth daily. 07/04/19   Cristal Deer, MD  atorvastatin (LIPITOR) 40 MG tablet Take 1 tablet (40 mg total) by mouth daily. 12/27/20   Liane Comber, NP  Cholecalciferol (VITAMIN D) 125 MCG (5000 UT) CAPS Take 5,000 Units by mouth in the morning and at bedtime.    [provider]  clorazepate (TRANXENE-T) 7.5 MG tablet Take    1/2 to 1 tablet    1 or 2 x /day    ONLY if needed for  Nerves 05/22/20   Unk Pinto, MD  famotidine (PEPCID) 20 MG tablet TAKE 1 TABLET TWICE A DAY WITH MEALS FOR INDIGESTION. 11/26/20   Liane Comber, NP  fenofibrate (TRICOR) 145 MG tablet TAKE 1 TABLET DAILY FOR TRIGLYCERIDES (BLOOD FATS) 02/19/21   Liane Comber, NP  ferrous sulfate  325 (65 FE) MG tablet TAKE 1 TABLET 2 TIMES A DAY WITH A MEAL FOR IRON DEFICIENCY 02/28/21   Magda Bernheim, NP  FLUoxetine (PROZAC) 20 MG capsule Take 20 mg by mouth daily.    [provider]  hydrALAZINE (APRESOLINE) 100 MG tablet Take 1 tablet (100 mg total) by mouth every 8 (eight) hours. 10/14/20 02/24/21  Terrilee Croak, MD  levothyroxine (SYNTHROID) 88 MCG tablet TAKE 1 TABLET DAILY ON AN EMPTY STOMACH WITH ONLY WATER FOR 30 MINUTES & NO ANTACID MEDS, CALCIUM OR MAGNESIUM FOR 4 HOURS & AVOID BIOTIN 02/26/21   Magda Bernheim, NP  nystatin cream (MYCOSTATIN) Apply 1 application topically daily as needed for dry skin. 10/17/20   [provider]  [facility] ULTRA test strip TEST BLOOD SUGAR 3 TIMES A DAY 01/27/20   Vicie Mutters R, PA-C  polyethylene glycol (MIRALAX / GLYCOLAX) 17 g packet Take 17 g by mouth daily. 03/06/20   Vladimir Crofts, PA-C  torsemide (DEMADEX) 100 MG tablet Take 1 tablet (100 mg total) by mouth daily. In case of worsening leg swelling, or shortness of breath take twice daily. 03/27/21   Unk Pinto, MD  vitamin C (ASCORBIC ACID) 500 MG tablet Take 500 mg by mouth daily. Take with iron/ferrous sulfate    [provider]    Allergies    Sulfa  antibiotics and Sulfacetamide sodium  Review of Systems   Review of Systems  Constitutional:  Negative for fever.  HENT:  Negative for rhinorrhea and sore throat.   Eyes:  Negative for redness.  Respiratory:  Negative for cough.   Cardiovascular:  Negative for chest pain.  Gastrointestinal:  Negative for abdominal pain, diarrhea, nausea and vomiting.  Genitourinary:  Negative for dysuria, frequency, hematuria and urgency.  Musculoskeletal:  Negative for myalgias.  Skin:  Negative for rash.  Neurological:  Positive for facial asymmetry, speech difficulty and weakness. Negative for dizziness, tremors, seizures, syncope, light-headedness, numbness and headaches.   Physical Exam Updated Vital Signs BP (!) 155/73   Pulse 81   Temp 97.9 F (36.6 C) (Oral)   Resp (!) 23   Ht 5\' 5"  (1.651 m)   Wt 63.5 kg   SpO2 96%   BMI 23.30 kg/m   Physical Exam Vitals and nursing note reviewed.  Constitutional:      General: She is not in acute distress.    Appearance: She is well-developed.  HENT:     Head: Normocephalic and atraumatic.     Right Ear: External ear normal.     Left Ear: External ear normal.     Nose: Nose normal.  Eyes:     Conjunctiva/sclera: Conjunctivae normal.  Cardiovascular:     Rate and Rhythm: Normal rate and regular rhythm.     Heart sounds: No murmur heard. Pulmonary:     Effort: No respiratory distress.     Breath sounds: No wheezing, rhonchi or rales.  Abdominal:     Palpations: Abdomen is soft.     Tenderness: There is no abdominal tenderness. There is no guarding or rebound.  Musculoskeletal:     Cervical back: Normal range of motion and neck supple.     Right lower leg: No edema.     Left lower leg: No edema.  Skin:    General: Skin is warm and dry.     Findings: No rash.  Neurological:     General: No focal deficit present.     Mental Status: She is alert.  Mental status is at baseline.     Cranial Nerves: Facial asymmetry present.     Motor: No  weakness.     Comments: Mild L sided facial droop. Can move L arm and L leg however cannot hold up against gravity.   Psychiatric:        Mood and Affect: Mood normal.    ED Results / Procedures / Treatments   Labs (all labs ordered are listed, but only abnormal results are displayed) Labs Reviewed  CBC - Abnormal; Notable for the following components:      Result Value   RBC 3.20 (*)    Hemoglobin 9.6 (*)    HCT 28.6 (*)    All other components within normal limits  DIFFERENTIAL - Abnormal; Notable for the following components:   Lymphs Abs 0.6 (*)    All other components within normal limits  COMPREHENSIVE METABOLIC PANEL - Abnormal; Notable for the following components:   Potassium 2.5 (*)    Chloride 93 (*)    Glucose, Bld 187 (*)    BUN 99 (*)    Creatinine, Ser 5.41 (*)    Albumin 3.4 (*)    AST 14 (*)    Alkaline Phosphatase 30 (*)    GFR, Estimated 8 (*)    Anion gap 19 (*)    All other components within normal limits  I-STAT CHEM 8, ED - Abnormal; Notable for the following components:   Sodium 133 (*)    Potassium 2.6 (*)    Chloride 94 (*)    BUN 109 (*)    Creatinine, Ser 5.60 (*)    Glucose, Bld 186 (*)    Calcium, Ion 1.12 (*)    Hemoglobin 9.9 (*)    HCT 29.0 (*)    All other components within normal limits  RESP PANEL BY RT-PCR (FLU A&B, COVID) ARPGX2  ETHANOL  PROTIME-INR  APTT  RAPID URINE DRUG SCREEN, HOSP PERFORMED  URINALYSIS, ROUTINE W REFLEX MICROSCOPIC    EKG EKG Interpretation  Date/Time:  Friday April 19 2021 10:23:12 EDT Ventricular Rate:  80 PR Interval:  204 QRS Duration: 108 QT Interval:  340 QTC Calculation: 393 R Axis:   65 Text Interpretation: Sinus rhythm Anteroseptal infarct, old Nonspecific repol abnormality, diffuse leads Worsened mild ST depression in lateral V leads when compared to EKG from 02/24/21 Confirmed by Lorre Munroe ([id number]) on 04/19/2021 10:25:46 AM  Radiology CT HEAD WO CONTRAST  Result Date:  04/19/2021 CLINICAL DATA:  Left arm and leg weakness EXAM: CT HEAD WITHOUT CONTRAST TECHNIQUE: Contiguous axial images were obtained from the base of the skull through the vertex without intravenous contrast. COMPARISON:  Head CT 08/23/2016 FINDINGS: Brain: There is a remote infarct in the left superior frontal gyrus, increased in extent since the prior CT from 2018. There is an additional smaller remote infarct in the right frontal lobe at the vertex (4-37). There is a small remote lacunar infarct in the right basal ganglia. There is no evidence of acute intracranial hemorrhage, extra-axial fluid collection, or infarct. There is mild global parenchymal volume loss with commensurate enlargement of the ventricular system. There is no mass lesion. There is no midline shift. Vascular: There is calcification of the bilateral cavernous ICAs and vertebral arteries. Skull: Normal. Negative for fracture or focal lesion. Sinuses/Orbits: The paranasal sinuses are clear. Bilateral lens implants are in place. The globes and orbits are otherwise unremarkable. Other: None. IMPRESSION: 1. No acute intracranial pathology. 2. Remote infarcts in the bilateral  frontal lobes, increased in extent since 2018. Electronically Signed   By: Valetta Mole M.D.   On: 04/19/2021 11:40   DG Chest Port 1 View  Result Date: 04/19/2021 CLINICAL DATA:  Short of breath EXAM: PORTABLE CHEST 1 VIEW COMPARISON:  CT 01/26/2020 FINDINGS: Band consolidation in the RIGHT mid lung with fiducial markers is unchanged. Tenting of the RIGHT hemidiaphragm consistent atelectasis. Upper lungs clear. IMPRESSION: 1. RIGHT mid lung mass again demonstrated. Most recent CT 01/26/2020. 2. RIGHT lower lobe atelectasis. 3. No clear acute findings. Electronically Signed   By: Suzy Bouchard M.D.   On: 04/19/2021 14:01    Procedures Procedures   Medications Ordered in ED Medications  potassium chloride 10 mEq in 100 mL IVPB (0 mEq Intravenous Stopped 04/19/21 1400)   diazepam (VALIUM) tablet 2 mg (has no administration in time range)  potassium chloride (KLOR-CON) packet 40 mEq (has no administration in time range)  albuterol (PROVENTIL) (2.5 MG/3ML) 0.083% nebulizer solution 2.5 mg (2.5 mg Nebulization Given 04/19/21 1410)  ipratropium (ATROVENT) nebulizer solution 0.5 mg (0.5 mg Nebulization Given 04/19/21 1500)    ED Course  I have reviewed the triage vital signs and the nursing notes.  Pertinent labs & imaging results that were available during my care of the patient were reviewed by me and considered in my medical decision making (see chart for details).  Patient seen and examined. Work-up initiated.  No code stroke as patient is VAN negative and last known well was 8:30 PM yesterday per patient, 9 PM per EMS.   Vital signs reviewed and are as follows: BP (!) 145/48 (BP Location: Right Arm)   Pulse 78   Temp 97.9 F (36.6 C) (Oral)   Resp (!) 21   Ht 5\' 5"  (1.651 m)   Wt 63.5 kg   SpO2 100%   BMI 23.30 kg/m   1:04 PM patient and brother updated on results to this point.  Patient waiting MRI.  She is complaining of shortness of breath and now has wheezing on exam.  Potassium is running by IV.  Will give 2.5 mg albuterol and 0.5 mg Atrovent for wheezing.  4:01 PM Patient is unable to tolerate MRI.  She does not want to have this done due to inability to lie flat.  Discussed that she may have had a stroke.  Even if we diagnose this on MRI, there is likely little in the way of medical management given her current situation.  Speech is clearer and patient is using her left leg more during ED stay.   I had a discussion in regards to goals of treatment and disposition today with hospice liaison [employer].  Patient agrees that she will not safely be able to go home.  We discussed hospice aides, however this is likely not a suitable plan.  Patient will be best served with placement at this point.  Will involve TOC.  Brother at bedside in agreement  with plan.  Signout to [employer] at shift change.     MDM Rules/Calculators/A&P                           Pending dispo plan.    Final Clinical Impression(s) / ED Diagnoses Final diagnoses:  Left-sided weakness  Hypokalemia    Rx / DC Orders ED Discharge Orders     None        Carlisle Cater, PA-C 04/19/21 1611    Arnaldo Natal,  MD 04/22/21 [phone]

## 2021-04-19 NOTE — Progress Notes (Signed)
[facility] ED 32 [facility])  Hospitalized Hospice Patient Note  This patient a hospice patient with [facility], admitted 03/14/21 with a terminal diagnosis of CHF with ESRD.  Visited at bedside with pt and pt's brother, Kaleen Mask.    Report exchanged with care team at [facility] ED.      [facility] will continue to follow for any discharge planning needs and to coordinate continuation of hospice care.    Thank you for the opportunity to participate in this patient's care.     Domenic Moras, BSN, RN [facility] Liaison [phone] (24h on call)

## 2021-04-19 NOTE — ED Provider Notes (Signed)
Accepted handoff at shift change from [employer]. Please see prior provider note for more detail.   Briefly: Patient is 75 y.o.   Per prior provider "74 year old female past medical history for hypertension, COPD, type 2 diabetes mellitus, heart failure, DVT, chronic kidney disease stage V, iron deficiency anemia, history of CVA, ambulatory dysfunction, anxiety/bipolar disorder and lung cancer in remission --presents to the emergency department for evaluation of left-sided weakness involving the arm and leg as well as garbled speech, patient states started around 8:30 PM last night.  She states that symptoms persisted this morning and she called her brother who drove up from [place].  EMS was called for transport.  Patient denies vision changes.  No aphasia, neglect reported.  Patient denies headache.  The onset of this condition was acute. The course is constant. Aggravating factors: none. Alleviating factors: none. "  Plan: Per prior provider will attempt to discharge home.  I think this is very reasonable.  Will discuss with social work/transition of care team.   Physical Exam  BP (!) 148/64   Pulse 95   Temp 97.9 F (36.6 C) (Oral)   Resp (!) 21   Ht 5\' 5"  (1.651 m)   Wt 63.5 kg   SpO2 92%   BMI 23.30 kg/m   Physical Exam  ED Course/Procedures     Procedures Results for orders placed or performed during the hospital encounter of 04/19/21  Resp Panel by RT-PCR (Flu A&B, Covid) Nasopharyngeal Swab   Specimen: Nasopharyngeal Swab; Nasopharyngeal(NP) swabs in vial transport medium  Result Value Ref Range   SARS Coronavirus 2 by RT PCR NEGATIVE NEGATIVE   Influenza A by PCR NEGATIVE NEGATIVE   Influenza B by PCR NEGATIVE NEGATIVE  Ethanol  Result Value Ref Range   Alcohol, Ethyl (B) <10 <10 mg/dL  Protime-INR  Result Value Ref Range   Prothrombin Time 13.6 11.4 - 15.2 seconds   INR 1.0 0.8 - 1.2  APTT  Result Value Ref Range   aPTT 28 24 - 36 seconds  CBC  Result  Value Ref Range   WBC 6.6 4.0 - 10.5 K/uL   RBC 3.20 (L) 3.87 - 5.11 MIL/uL   Hemoglobin 9.6 (L) 12.0 - 15.0 g/dL   HCT 28.6 (L) 36.0 - 46.0 %   MCV 89.4 80.0 - 100.0 fL   MCH 30.0 26.0 - 34.0 pg   MCHC 33.6 30.0 - 36.0 g/dL   RDW 13.2 11.5 - 15.5 %   Platelets 344 150 - 400 K/uL   nRBC 0.0 0.0 - 0.2 %  Differential  Result Value Ref Range   Neutrophils Relative % 78 %   Neutro Abs 5.2 1.7 - 7.7 K/uL   Lymphocytes Relative 9 %   Lymphs Abs 0.6 (L) 0.7 - 4.0 K/uL   Monocytes Relative 10 %   Monocytes Absolute 0.6 0.1 - 1.0 K/uL   Eosinophils Relative 1 %   Eosinophils Absolute 0.1 0.0 - 0.5 K/uL   Basophils Relative 1 %   Basophils Absolute 0.0 0.0 - 0.1 K/uL   Immature Granulocytes 1 %   Abs Immature Granulocytes 0.03 0.00 - 0.07 K/uL  Comprehensive metabolic panel  Result Value Ref Range   Sodium 135 135 - 145 mmol/L   Potassium 2.5 (LL) 3.5 - 5.1 mmol/L   Chloride 93 (L) 98 - 111 mmol/L   CO2 23 22 - 32 mmol/L   Glucose, Bld 187 (H) 70 - 99 mg/dL   BUN 99 (H) 8 -  23 mg/dL   Creatinine, Ser 5.41 (H) 0.44 - 1.00 mg/dL   Calcium 9.7 8.9 - 10.3 mg/dL   Total Protein 6.7 6.5 - 8.1 g/dL   Albumin 3.4 (L) 3.5 - 5.0 g/dL   AST 14 (L) 15 - 41 U/L   ALT 12 0 - 44 U/L   Alkaline Phosphatase 30 (L) 38 - 126 U/L   Total Bilirubin 1.2 0.3 - 1.2 mg/dL   GFR, Estimated 8 (L) >60 mL/min   Anion gap 19 (H) 5 - 15  Urine rapid drug screen (hosp performed)  Result Value Ref Range   Opiates NONE DETECTED NONE DETECTED   Cocaine NONE DETECTED NONE DETECTED   Benzodiazepines POSITIVE (A) NONE DETECTED   Amphetamines NONE DETECTED NONE DETECTED   Tetrahydrocannabinol NONE DETECTED NONE DETECTED   Barbiturates NONE DETECTED NONE DETECTED  Urinalysis, Routine w reflex microscopic Urine, Clean Catch  Result Value Ref Range   Color, Urine YELLOW YELLOW   APPearance CLEAR CLEAR   Specific Gravity, Urine 1.011 1.005 - 1.030   pH 6.0 5.0 - 8.0   Glucose, UA 50 (A) NEGATIVE mg/dL   Hgb  urine dipstick NEGATIVE NEGATIVE   Bilirubin Urine NEGATIVE NEGATIVE   Ketones, ur NEGATIVE NEGATIVE mg/dL   Protein, ur 100 (A) NEGATIVE mg/dL   Nitrite NEGATIVE NEGATIVE   Leukocytes,Ua NEGATIVE NEGATIVE   RBC / HPF 0-5 0 - 5 RBC/hpf   WBC, UA 0-5 0 - 5 WBC/hpf   Bacteria, UA RARE (A) NONE SEEN   Squamous Epithelial / LPF 0-5 0 - 5  I-stat chem 8, ED  Result Value Ref Range   Sodium 133 (L) 135 - 145 mmol/L   Potassium 2.6 (LL) 3.5 - 5.1 mmol/L   Chloride 94 (L) 98 - 111 mmol/L   BUN 109 (H) 8 - 23 mg/dL   Creatinine, Ser 5.60 (H) 0.44 - 1.00 mg/dL   Glucose, Bld 186 (H) 70 - 99 mg/dL   Calcium, Ion 1.12 (L) 1.15 - 1.40 mmol/L   TCO2 27 22 - 32 mmol/L   Hemoglobin 9.9 (L) 12.0 - 15.0 g/dL   HCT 29.0 (L) 36.0 - 46.0 %   Comment NOTIFIED PHYSICIAN    CT HEAD WO CONTRAST  Result Date: 04/19/2021 CLINICAL DATA:  Left arm and leg weakness EXAM: CT HEAD WITHOUT CONTRAST TECHNIQUE: Contiguous axial images were obtained from the base of the skull through the vertex without intravenous contrast. COMPARISON:  Head CT 08/23/2016 FINDINGS: Brain: There is a remote infarct in the left superior frontal gyrus, increased in extent since the prior CT from 2018. There is an additional smaller remote infarct in the right frontal lobe at the vertex (4-37). There is a small remote lacunar infarct in the right basal ganglia. There is no evidence of acute intracranial hemorrhage, extra-axial fluid collection, or infarct. There is mild global parenchymal volume loss with commensurate enlargement of the ventricular system. There is no mass lesion. There is no midline shift. Vascular: There is calcification of the bilateral cavernous ICAs and vertebral arteries. Skull: Normal. Negative for fracture or focal lesion. Sinuses/Orbits: The paranasal sinuses are clear. Bilateral lens implants are in place. The globes and orbits are otherwise unremarkable. Other: None. IMPRESSION: 1. No acute intracranial pathology. 2.  Remote infarcts in the bilateral frontal lobes, increased in extent since 2018. Electronically Signed   By: Valetta Mole M.D.   On: 04/19/2021 11:40   DG Chest Port 1 View  Result Date: 04/19/2021 CLINICAL DATA:  Short of breath EXAM: PORTABLE CHEST 1 VIEW COMPARISON:  CT 01/26/2020 FINDINGS: Band consolidation in the RIGHT mid lung with fiducial markers is unchanged. Tenting of the RIGHT hemidiaphragm consistent atelectasis. Upper lungs clear. IMPRESSION: 1. RIGHT mid lung mass again demonstrated. Most recent CT 01/26/2020. 2. RIGHT lower lobe atelectasis. 3. No clear acute findings. Electronically Signed   By: Suzy Bouchard M.D.   On: 04/19/2021 14:01    MDM  I reviewed lab work patient has had persistently low potassium in the past has had persistently elevated magnesium levels.  She has received repletion here in the ER she may continue to have outpatient treatment for her hypokalemia  On my examination she is now moving both legs without difficulty.  Discussed with brother the possibility of sending patient home with home health being set up by social work/transitional care team.  He states that patient's husband is able to care for her as he has been for quite some time until home health is available.  Patient and patient's brother agreeable to plan.  Transition of care team assessed patient at bedside and was able to give resources.     Pati Gallo [place], [place] 04/19/21 2212    Dorie Rank, MD 04/20/21 [phone]

## 2021-04-20 ENCOUNTER — Telehealth: Payer: Self-pay

## 2021-04-20 NOTE — TOC Initial Note (Addendum)
Transition of Care [facility]) - Initial/Assessment Note    Patient Details  Name: Ashley [name] MRN: [id number] Date of Birth: 04-22-1946  Transition of Care [facility]) CM/SW Contact:    Verdell Carmine, RN Phone Number: 04/20/2021, 11:02 AM  Clinical Narrative:                 Patient seen in ED for weakness, lives at home with her husband who cares for her Home Health was mentioned iin notes, however not ordered, patient will have to call PCP on Tuesday for orders and set up of home health.patient notified    Barriers to Discharge: ED No Barriers (home health)   Patient Goals and CMS Choice        Expected Discharge Plan and [place]                      Prior Living Arrangements/Services                       Activities of Daily Living      Permission Sought/Granted                  Emotional Assessment              Admission diagnosis:  stroke like sx Patient Active Problem List   Diagnosis Date Noted   Depression 03/03/2021   Acute CHF (congestive heart failure) ([place]) 02/24/2021   Hypertensive urgency 02/24/2021   Elevated troponin 02/24/2021   CKD (chronic kidney disease) stage 5, GFR less than 15 ml/min ([place]) 02/24/2021   DNR (do not resuscitate) 02/24/2021   Symptomatic anemia 11/07/2020   Hematoma of left thigh 11/07/2020   Acute renal failure superimposed on stage 5 chronic kidney disease, not on chronic dialysis ([place]) 10/09/2020   AKI (acute kidney injury) ([place]) 10/09/2020   Supratherapeutic INR    Constipation 08/27/2020   Generalized abdominal pain 08/27/2020   Aortic atherosclerosis ([place]) by Chest CT scan 01/2020 06/19/2020   Acute deep vein thrombosis (DVT) of femoral vein of right lower extremity ([place]) 03/29/2020   Cancer ([place]) 03/07/2020   History of lung cancer 03/07/2020   Acquired thrombophilia ([place]) 11/29/2019   Hypokalemia 06/30/2019   Right lower lobe pulmonary nodule  06/24/2019   Long term (current) use of insulin ([place]) 02/21/2019   Obesity (BMI 30.0-34.9) 01/06/2018   Diabetes mellitus type 2, uncontrolled ([place]) 12/17/2017   Peripheral arterial disease ([place]) 10/20/2017   Polyp of ascending colon    Mild nonproliferative diabetic retinopathy of both eyes without macular edema associated with type 2 diabetes mellitus ([place]) 06/09/2017   Senile purpura ([place]) 06/09/2017   Hyperlipidemia associated with type 2 diabetes mellitus ([place]) 06/08/2017   Memory difficulty 08/14/2016   Frequent falls 04/08/2016   Hypothyroidism 03/05/2016   GERD (gastroesophageal reflux disease) 03/05/2016   Gout 08/29/2015   Non compliance w medication regimen 02/20/2015   Bipolar depression ([place]) 11/06/2014   Vitamin D deficiency 12/01/2013   Medication management 12/01/2013   Abnormality of gait 03/10/2013   Carpal tunnel syndrome 10/25/2012   Diabetic neuropathy ([place]) 10/25/2012   Major depressive disorder, recurrent episode ([place]) 10/12/2009   Essential hypertension 10/12/2009   COPD 10/12/2009   PCP:  Unk Pinto, MD Pharmacy:   [id number] - [place], [place]  [place] [address] Phone: [phone] Fax: [phone]     Social Determinants of Health (SDOH) Interventions    Readmission Risk Interventions Readmission Risk Prevention Plan 03/03/2021  Transportation Screening Complete  Medication Review ([place]) Complete  PCP or Specialist appointment within 3-5 days of discharge Complete  HRI or Home Care Consult Complete  SW Recovery Care/Counseling Consult Complete  Palliative Care Screening Complete  [place] Not Applicable  Some recent data might be hidden

## 2021-04-20 NOTE — Telephone Encounter (Signed)
Called pateint to ask her about home health. She states she does not need home health, she has hospice coming to see her. They just saw her today and did an assessment.

## 2021-04-23 ENCOUNTER — Other Ambulatory Visit: Payer: Self-pay | Admitting: Internal Medicine

## 2021-04-23 DIAGNOSIS — N185 Chronic kidney disease, stage 5: Secondary | ICD-10-CM

## 2021-04-23 MED ORDER — TORSEMIDE 100 MG PO TABS: ORAL_TABLET | ORAL | 0 refills | Status: DC

## 2021-05-01 ENCOUNTER — Other Ambulatory Visit: Payer: Self-pay | Admitting: Internal Medicine

## 2021-05-01 ENCOUNTER — Other Ambulatory Visit: Payer: Self-pay | Admitting: Adult Health

## 2021-05-02 ENCOUNTER — Other Ambulatory Visit: Payer: Self-pay | Admitting: Internal Medicine

## 2021-05-02 DIAGNOSIS — N185 Chronic kidney disease, stage 5: Secondary | ICD-10-CM

## 2021-05-02 IMAGING — CT CT ABD-PELV W/O CM
2 of 3 series · 15 of 42 positions shown, 17 images · non-contrast
Comparison: CT scan 06/01/2014

CLINICAL DATA: Diffuse abdominal pain with nausea, vomiting and
diarrhea for 3 days.

EXAM:
CT ABDOMEN AND PELVIS WITHOUT CONTRAST
TECHNIQUE: Multidetector CT imaging of the abdomen and pelvis was performed
following the standard protocol without IV contrast.

[Series 3: a/p w/o 5mm · axial · non-contrast · 0.84mm/px · z∈[+225,+660]mm · 12 of 95 slices shown, 14 images]
[im 4/95  soft-tissue]
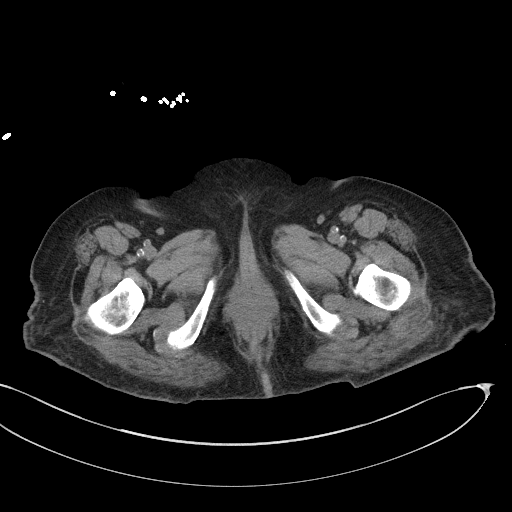
[im 4/95  bone]
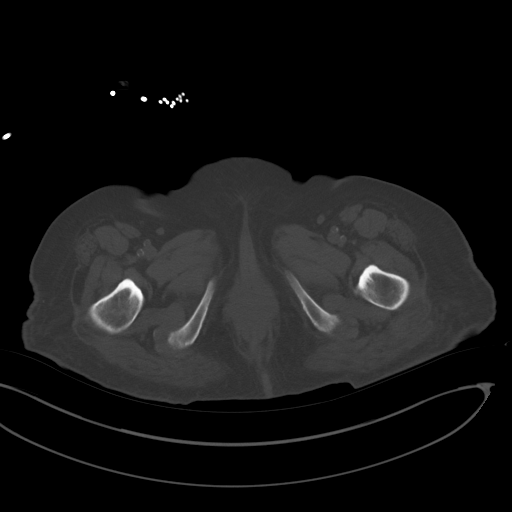
[im 12/95  soft-tissue]
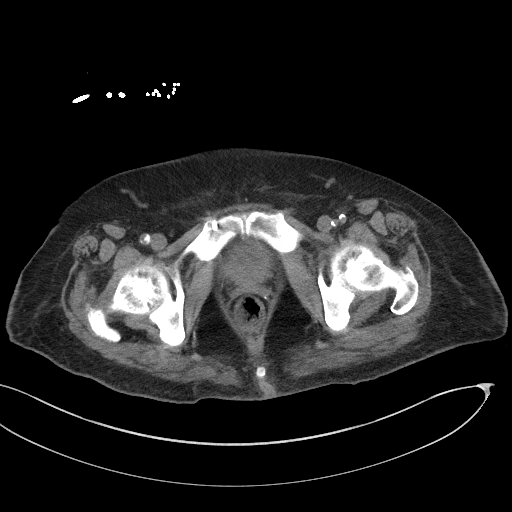
[im 20/95  soft-tissue]
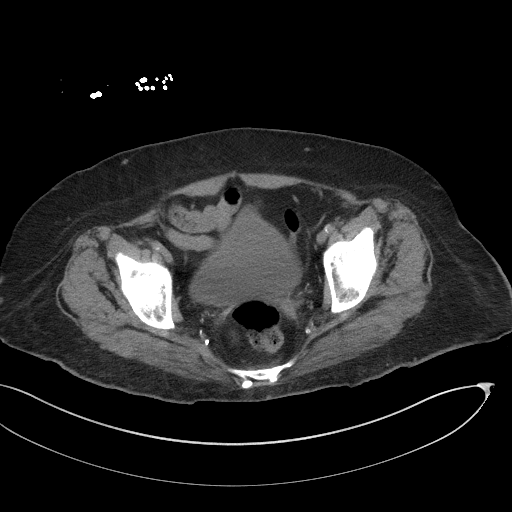
[im 28/95  soft-tissue]
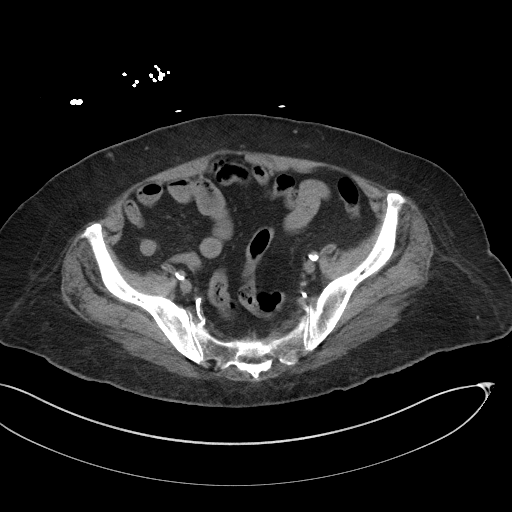
[im 36/95  soft-tissue]
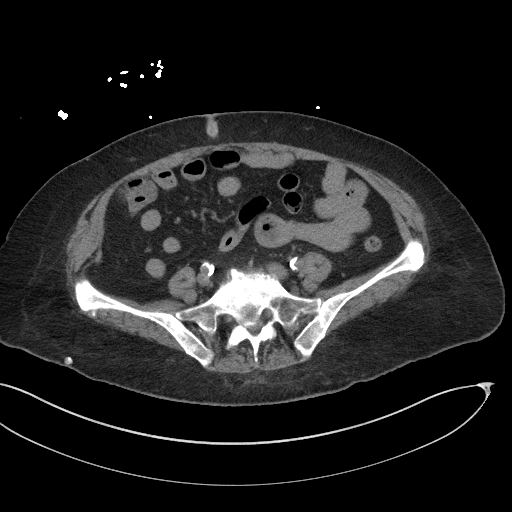
[im 44/95  soft-tissue]
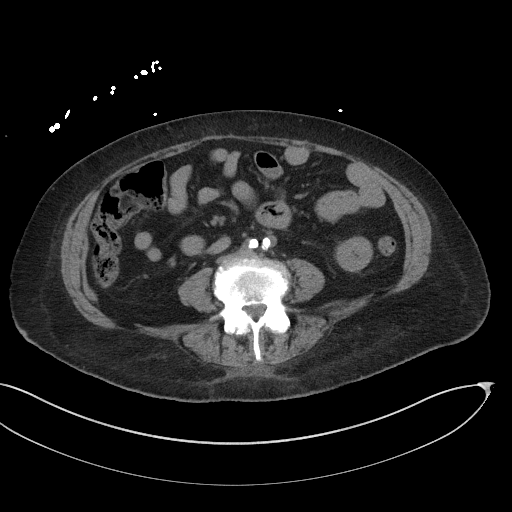
[im 51/95  soft-tissue]
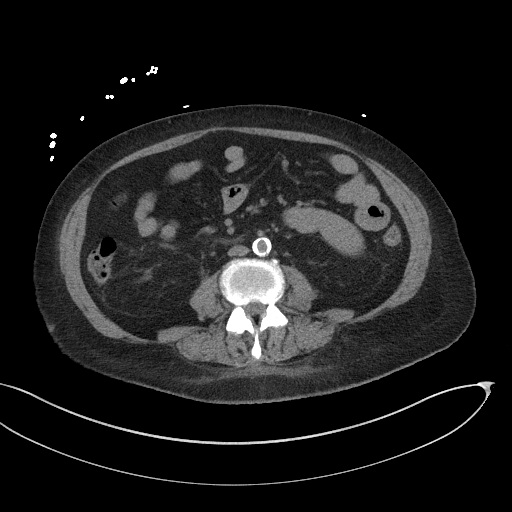
[im 59/95  soft-tissue]
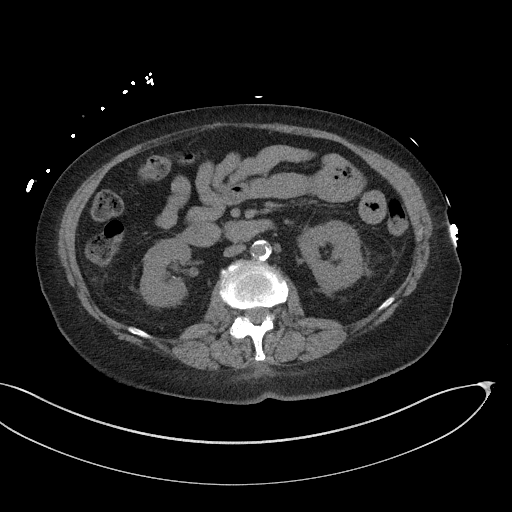
[im 67/95  soft-tissue]
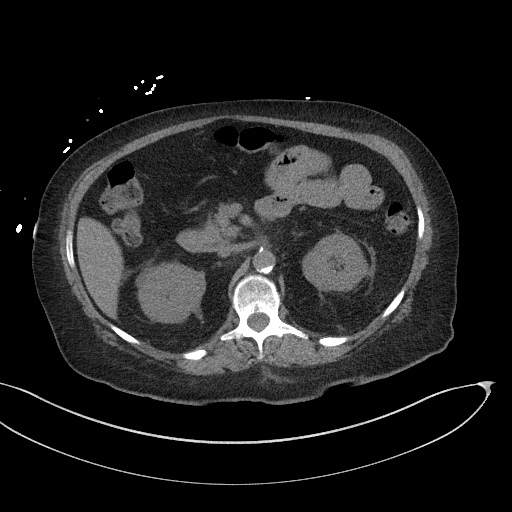
[im 67/95  bone]
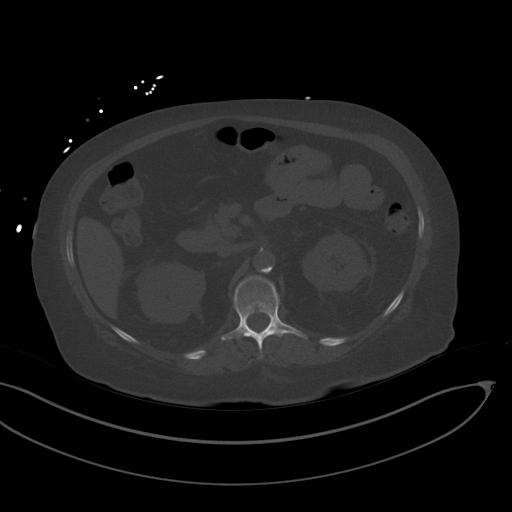
[im 75/95  soft-tissue]
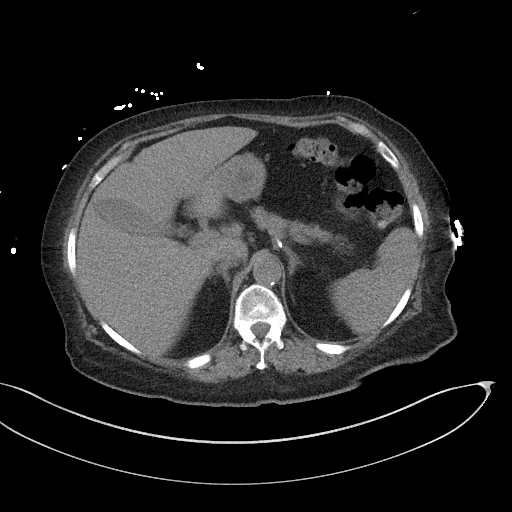
[im 83/95  soft-tissue]
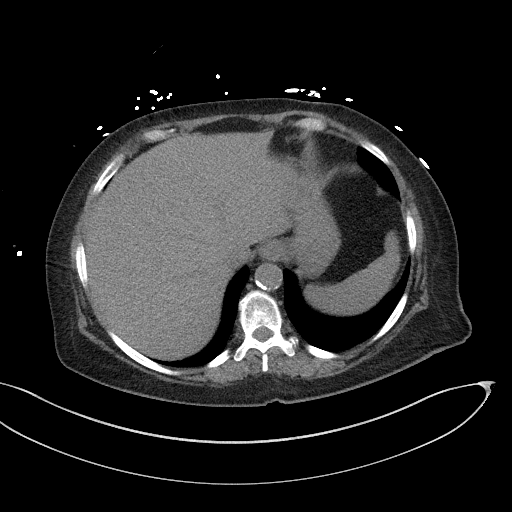
[im 91/95  soft-tissue]
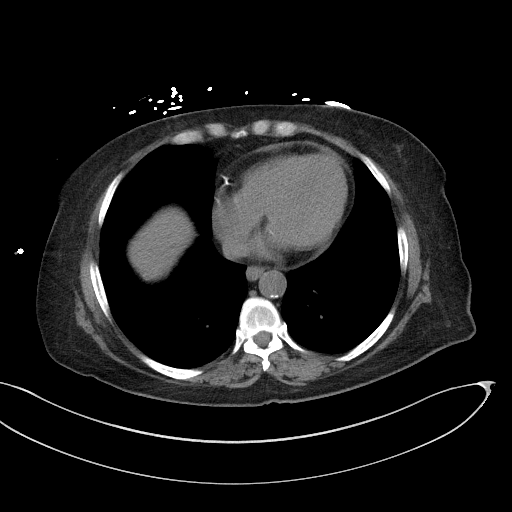

[Series 6: a/p w/o cor · coronal · non-contrast · 0.80mm/px · 3 of 126 slices shown]
[im 42/126  soft-tissue]
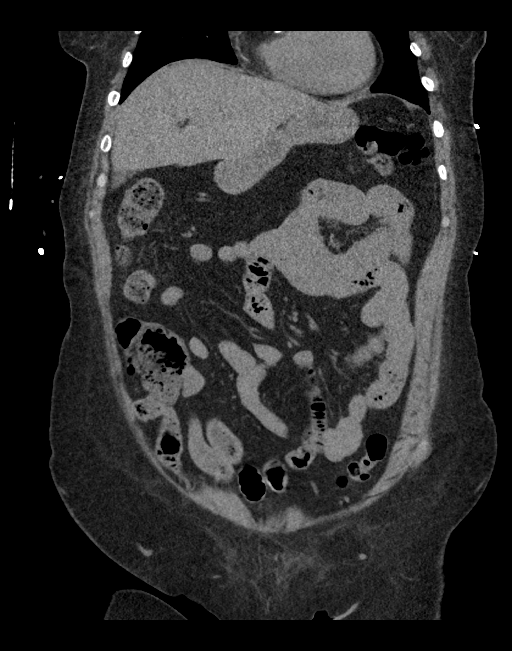
[im 56/126  soft-tissue]
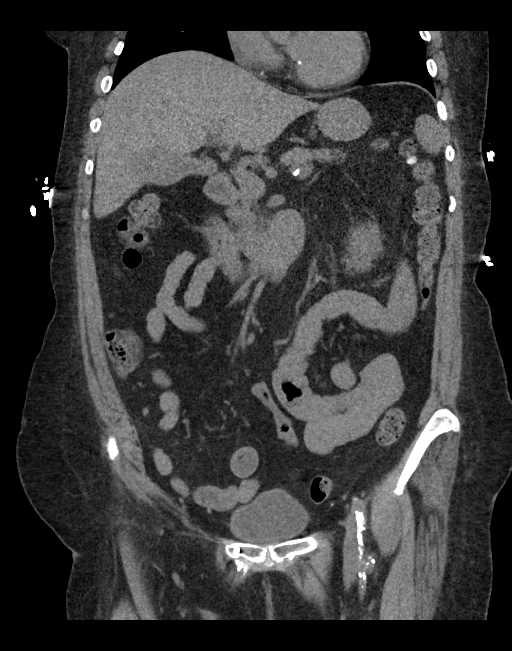
[im 70/126  soft-tissue]
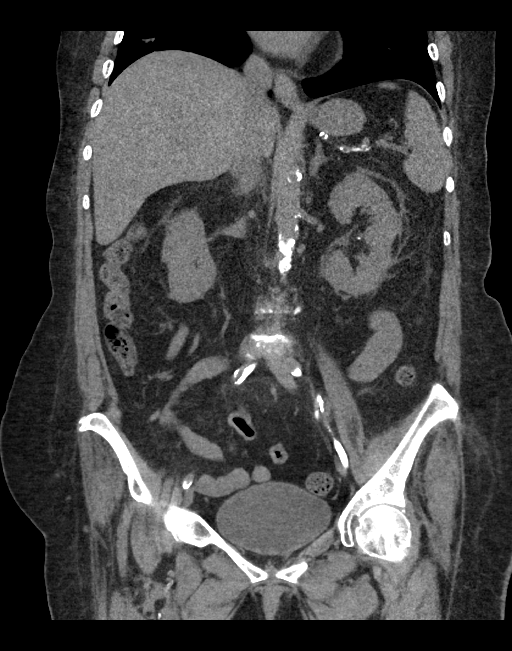

[15 of 42 positions shown; findings below may reference images not displayed]

FINDINGS: Lower chest: 13.5 mm right lower lobe pulmonary lesion is
indeterminate. It was not present on the prior study but I do not
think that study 1 high enough to see this area. Recommend full
chest CT when able for further evaluation. No pleural effusions. The
heart is normal in size. No pericardial effusion. Coronary artery
and aortic calcifications are noted.

Hepatobiliary: No focal hepatic lesions are identified without
contrast. The gallbladder appears normal. No common bile duct
dilatation.

Pancreas: No mass, inflammation or ductal dilatation.

Spleen: Normal size.  No focal lesions.

Adrenals/Urinary Tract: The adrenal glands are unremarkable.

Renal artery calcifications but no definite renal calculi. No
hydroureteronephrosis. No ureteral or bladder calculi. No obvious
renal or bladder masses without contrast.

Stomach/Bowel: The stomach, duodenum, small bowel and colon are
grossly normal without oral contrast. No acute inflammatory changes,
mass lesions or obstructive findings. The terminal ileum and
appendix are normal. Scattered sigmoid colon diverticulosis but no
findings for acute diverticulitis.

Vascular/Lymphatic: Advanced atherosclerotic calcifications
involving the aorta and branch vessels but no aneurysm. No
mesenteric or retroperitoneal mass or adenopathy.

Reproductive: The uterus is surgically absent. I believe both
ovaries are still present and appear normal.

Other: No pelvic mass or adenopathy. No free pelvic fluid
collections. No inguinal mass or adenopathy. No abdominal wall
hernia or subcutaneous lesions.

Musculoskeletal: No significant bony findings. Disc protrusion noted
at L4-5 with moderate spinal and bilateral lateral recess stenosis.
IMPRESSION: 1. No acute abdominal/pelvic findings, mass lesions or
lymphadenopathy.
2. Age advanced atherosclerotic calcifications involving the aorta,
iliac arteries and branch vessels.
3. Indeterminate 13.5 mm right lower lobe pulmonary nodule.
Recommend full chest CT when able, for further evaluation.
4. Moderate-sized disc protrusion at L4-5 noted.

## 2021-05-06 ENCOUNTER — Inpatient Hospital Stay (HOSPITAL_COMMUNITY)

## 2021-05-06 ENCOUNTER — Inpatient Hospital Stay (HOSPITAL_COMMUNITY)
Admission: EM | Admit: 2021-05-06 | Discharge: 2021-05-08 | DRG: 071 | Disposition: A | Discharge status: Hospice | Attending: Internal Medicine | Admitting: Internal Medicine

## 2021-05-06 ENCOUNTER — Encounter (HOSPITAL_COMMUNITY): Payer: Self-pay | Admitting: Student

## 2021-05-06 ENCOUNTER — Other Ambulatory Visit: Payer: Self-pay

## 2021-05-06 DIAGNOSIS — K5641 Fecal impaction: Secondary | ICD-10-CM | POA: Diagnosis present

## 2021-05-06 DIAGNOSIS — J9811 Atelectasis: Secondary | ICD-10-CM | POA: Diagnosis present

## 2021-05-06 DIAGNOSIS — Z79899 Other long term (current) drug therapy: Secondary | ICD-10-CM

## 2021-05-06 DIAGNOSIS — Z8249 Family history of ischemic heart disease and other diseases of the circulatory system: Secondary | ICD-10-CM

## 2021-05-06 DIAGNOSIS — E1122 Type 2 diabetes mellitus with diabetic chronic kidney disease: Secondary | ICD-10-CM | POA: Diagnosis present

## 2021-05-06 DIAGNOSIS — Z6823 Body mass index (BMI) 23.0-23.9, adult: Secondary | ICD-10-CM

## 2021-05-06 DIAGNOSIS — Z8544 Personal history of malignant neoplasm of other female genital organs: Secondary | ICD-10-CM

## 2021-05-06 DIAGNOSIS — I132 Hypertensive heart and chronic kidney disease with heart failure and with stage 5 chronic kidney disease, or end stage renal disease: Secondary | ICD-10-CM | POA: Diagnosis present

## 2021-05-06 DIAGNOSIS — J45909 Unspecified asthma, uncomplicated: Secondary | ICD-10-CM | POA: Diagnosis present

## 2021-05-06 DIAGNOSIS — Z781 Physical restraint status: Secondary | ICD-10-CM

## 2021-05-06 DIAGNOSIS — N3090 Cystitis, unspecified without hematuria: Secondary | ICD-10-CM | POA: Diagnosis present

## 2021-05-06 DIAGNOSIS — E440 Moderate protein-calorie malnutrition: Secondary | ICD-10-CM | POA: Insufficient documentation

## 2021-05-06 DIAGNOSIS — G9341 Metabolic encephalopathy: Secondary | ICD-10-CM | POA: Diagnosis not present

## 2021-05-06 DIAGNOSIS — D631 Anemia in chronic kidney disease: Secondary | ICD-10-CM | POA: Diagnosis present

## 2021-05-06 DIAGNOSIS — Z20822 Contact with and (suspected) exposure to covid-19: Secondary | ICD-10-CM | POA: Diagnosis present

## 2021-05-06 DIAGNOSIS — R0600 Dyspnea, unspecified: Secondary | ICD-10-CM | POA: Diagnosis not present

## 2021-05-06 DIAGNOSIS — F419 Anxiety disorder, unspecified: Secondary | ICD-10-CM | POA: Diagnosis present

## 2021-05-06 DIAGNOSIS — Z66 Do not resuscitate: Secondary | ICD-10-CM | POA: Diagnosis present

## 2021-05-06 DIAGNOSIS — E1165 Type 2 diabetes mellitus with hyperglycemia: Secondary | ICD-10-CM | POA: Diagnosis present

## 2021-05-06 DIAGNOSIS — F319 Bipolar disorder, unspecified: Secondary | ICD-10-CM | POA: Diagnosis present

## 2021-05-06 DIAGNOSIS — M109 Gout, unspecified: Secondary | ICD-10-CM | POA: Diagnosis present

## 2021-05-06 DIAGNOSIS — E872 Acidosis: Secondary | ICD-10-CM | POA: Diagnosis present

## 2021-05-06 DIAGNOSIS — N390 Urinary tract infection, site not specified: Secondary | ICD-10-CM

## 2021-05-06 DIAGNOSIS — Z7984 Long term (current) use of oral hypoglycemic drugs: Secondary | ICD-10-CM

## 2021-05-06 DIAGNOSIS — Z882 Allergy status to sulfonamides status: Secondary | ICD-10-CM

## 2021-05-06 DIAGNOSIS — R911 Solitary pulmonary nodule: Secondary | ICD-10-CM | POA: Diagnosis present

## 2021-05-06 DIAGNOSIS — Z79890 Hormone replacement therapy (postmenopausal): Secondary | ICD-10-CM

## 2021-05-06 DIAGNOSIS — R4182 Altered mental status, unspecified: Secondary | ICD-10-CM

## 2021-05-06 DIAGNOSIS — E039 Hypothyroidism, unspecified: Secondary | ICD-10-CM | POA: Diagnosis present

## 2021-05-06 DIAGNOSIS — N185 Chronic kidney disease, stage 5: Secondary | ICD-10-CM | POA: Diagnosis present

## 2021-05-06 DIAGNOSIS — I5022 Chronic systolic (congestive) heart failure: Secondary | ICD-10-CM | POA: Diagnosis present

## 2021-05-06 DIAGNOSIS — F17210 Nicotine dependence, cigarettes, uncomplicated: Secondary | ICD-10-CM | POA: Diagnosis present

## 2021-05-06 DIAGNOSIS — F22 Delusional disorders: Secondary | ICD-10-CM | POA: Diagnosis present

## 2021-05-06 DIAGNOSIS — E876 Hypokalemia: Secondary | ICD-10-CM | POA: Diagnosis present

## 2021-05-06 DIAGNOSIS — I69954 Hemiplegia and hemiparesis following unspecified cerebrovascular disease affecting left non-dominant side: Secondary | ICD-10-CM

## 2021-05-06 DIAGNOSIS — E7800 Pure hypercholesterolemia, unspecified: Secondary | ICD-10-CM | POA: Diagnosis present

## 2021-05-06 DIAGNOSIS — Z515 Encounter for palliative care: Secondary | ICD-10-CM | POA: Diagnosis not present

## 2021-05-06 DIAGNOSIS — Z83438 Family history of other disorder of lipoprotein metabolism and other lipidemia: Secondary | ICD-10-CM

## 2021-05-06 DIAGNOSIS — K219 Gastro-esophageal reflux disease without esophagitis: Secondary | ICD-10-CM | POA: Diagnosis present

## 2021-05-06 DIAGNOSIS — Z823 Family history of stroke: Secondary | ICD-10-CM

## 2021-05-06 DIAGNOSIS — Z86718 Personal history of other venous thrombosis and embolism: Secondary | ICD-10-CM

## 2021-05-06 LAB — COMPREHENSIVE METABOLIC PANEL
ALT: 18 U/L (ref 0–44)
ALT: 19 U/L (ref 0–44)
AST: 19 U/L (ref 15–41)
AST: 20 U/L (ref 15–41)
Albumin: 3.2 g/dL — ABNORMAL LOW (ref 3.5–5.0)
Albumin: 3.1 g/dL — ABNORMAL LOW (ref 3.5–5.0)
Alkaline Phosphatase: 36 U/L — ABNORMAL LOW (ref 38–126)
Alkaline Phosphatase: 35 U/L — ABNORMAL LOW (ref 38–126)
Anion gap: 19 — ABNORMAL HIGH (ref 5–15)
Anion gap: 18 — ABNORMAL HIGH (ref 5–15)
BUN: 99 mg/dL — ABNORMAL HIGH (ref 8–23)
BUN: 97 mg/dL — ABNORMAL HIGH (ref 8–23)
CO2: 21 mmol/L — ABNORMAL LOW (ref 22–32)
CO2: 21 mmol/L — ABNORMAL LOW (ref 22–32)
Calcium: 9.2 mg/dL (ref 8.9–10.3)
Calcium: 8.6 mg/dL — ABNORMAL LOW (ref 8.9–10.3)
Chloride: 98 mmol/L (ref 98–111)
Chloride: 96 mmol/L — ABNORMAL LOW (ref 98–111)
Creatinine, Ser: 4.96 mg/dL — ABNORMAL HIGH (ref 0.44–1.00)
Creatinine, Ser: 4.49 mg/dL — ABNORMAL HIGH (ref 0.44–1.00)
GFR, Estimated: 9 mL/min — ABNORMAL LOW (ref >60)
GFR, Estimated: 10 mL/min — ABNORMAL LOW (ref >60)
Glucose, Bld: 438 mg/dL — ABNORMAL HIGH (ref 70–99)
Glucose, Bld: 337 mg/dL — ABNORMAL HIGH (ref 70–99)
Potassium: 3.1 mmol/L — ABNORMAL LOW (ref 3.5–5.1)
Potassium: 3.7 mmol/L (ref 3.5–5.1)
Sodium: 138 mmol/L (ref 135–145)
Sodium: 135 mmol/L (ref 135–145)
Total Bilirubin: 0.9 mg/dL (ref 0.3–1.2)
Total Bilirubin: 0.7 mg/dL (ref 0.3–1.2)
Total Protein: 6.7 g/dL (ref 6.5–8.1)
Total Protein: 6.6 g/dL (ref 6.5–8.1)

## 2021-05-06 LAB — CBC
HCT: 28.6 % — ABNORMAL LOW (ref 36.0–46.0)
Hemoglobin: 9.4 g/dL — ABNORMAL LOW (ref 12.0–15.0)
MCH: 29.7 pg (ref 26.0–34.0)
MCHC: 32.9 g/dL (ref 30.0–36.0)
MCV: 90.2 fL (ref 80.0–100.0)
Platelets: 344 10*3/uL (ref 150–400)
RBC: 3.17 MIL/uL — ABNORMAL LOW (ref 3.87–5.11)
RDW: 13.2 % (ref 11.5–15.5)
WBC: 7.5 10*3/uL (ref 4.0–10.5)
nRBC: 0 % (ref 0.0–0.2)

## 2021-05-06 LAB — CBG MONITORING, ED
Glucose-Capillary: 381 mg/dL — ABNORMAL HIGH (ref 70–99)
Glucose-Capillary: 302 mg/dL — ABNORMAL HIGH (ref 70–99)
Glucose-Capillary: 280 mg/dL — ABNORMAL HIGH (ref 70–99)

## 2021-05-06 LAB — LACTIC ACID, PLASMA
Lactic Acid, Venous: 1.8 mmol/L (ref 0.5–1.9)
Lactic Acid, Venous: 1.7 mmol/L (ref 0.5–1.9)

## 2021-05-06 LAB — MAGNESIUM: Magnesium: 2.6 mg/dL — ABNORMAL HIGH (ref 1.7–2.4)

## 2021-05-06 LAB — RESP PANEL BY RT-PCR (FLU A&B, COVID) ARPGX2
Influenza A by PCR: NEGATIVE
Influenza B by PCR: NEGATIVE
SARS Coronavirus 2 by RT PCR: NEGATIVE

## 2021-05-06 LAB — URINALYSIS, ROUTINE W REFLEX MICROSCOPIC
Bilirubin Urine: NEGATIVE
Glucose, UA: 500 mg/dL — AB
Ketones, ur: NEGATIVE mg/dL
Nitrite: NEGATIVE
Protein, ur: >=300 mg/dL — AB
Specific Gravity, Urine: 1.025 (ref 1.005–1.030)
WBC, UA: >50 WBC/hpf — ABNORMAL HIGH (ref 0–5)
pH: 6 (ref 5.0–8.0)

## 2021-05-06 LAB — PROTIME-INR
INR: 1 (ref 0.8–1.2)
Prothrombin Time: 13 seconds (ref 11.4–15.2)

## 2021-05-06 LAB — HEMOGLOBIN A1C
Hgb A1c MFr Bld: 7.9 % — ABNORMAL HIGH (ref 4.8–5.6)
Mean Plasma Glucose: 180.03 mg/dL

## 2021-05-06 LAB — BETA-HYDROXYBUTYRIC ACID: Beta-Hydroxybutyric Acid: 1.2 mmol/L — ABNORMAL HIGH (ref 0.05–0.27)

## 2021-05-06 LAB — ETHANOL: Alcohol, Ethyl (B): <10 mg/dL (ref <10)

## 2021-05-06 LAB — AMMONIA: Ammonia: 33 umol/L (ref 9–35)

## 2021-05-06 MED ORDER — SODIUM CHLORIDE 0.9 % IV SOLN
1.0000 g | Freq: Once | INTRAVENOUS | Status: AC
  Administered 2021-05-06: 1 g via INTRAVENOUS
  Filled 2021-05-06: qty 10

## 2021-05-06 MED ORDER — HALOPERIDOL LACTATE 5 MG/ML IJ SOLN
2.0000 mg | Freq: Once | INTRAMUSCULAR | Status: AC
  Administered 2021-05-06: 2 mg via INTRAVENOUS
  Filled 2021-05-06: qty 1

## 2021-05-06 MED ORDER — INSULIN ASPART 100 UNIT/ML IJ SOLN
0.0000 [IU] | Freq: Three times a day (TID) | INTRAMUSCULAR | Status: DC
  Administered 2021-05-07 (×2): 3 [IU] via SUBCUTANEOUS
  Filled 2021-05-06: qty 0.06

## 2021-05-06 MED ORDER — POTASSIUM CHLORIDE 10 MEQ/100ML IV SOLN
10.0000 meq | INTRAVENOUS | Status: AC
  Administered 2021-05-06 (×3): 10 meq via INTRAVENOUS
  Filled 2021-05-06 (×3): qty 100

## 2021-05-06 MED ORDER — SODIUM CHLORIDE 0.9 % IV SOLN: INTRAVENOUS | Status: DC

## 2021-05-06 MED ORDER — SODIUM CHLORIDE 0.9 % IV BOLUS
1000.0000 mL | Freq: Once | INTRAVENOUS | Status: AC
  Administered 2021-05-06: 1000 mL via INTRAVENOUS

## 2021-05-06 MED ORDER — LORAZEPAM 2 MG/ML IJ SOLN
1.0000 mg | Freq: Once | INTRAMUSCULAR | Status: AC
  Administered 2021-05-06: 1 mg via INTRAVENOUS
  Filled 2021-05-06: qty 1

## 2021-05-06 MED ORDER — SODIUM CHLORIDE 0.9 % IV SOLN
1.0000 g | INTRAVENOUS | Status: DC
  Administered 2021-05-07 – 2021-05-08 (×2): 1 g via INTRAVENOUS
  Filled 2021-05-06: qty 1
  Filled 2021-05-06 (×2): qty 10

## 2021-05-06 MED ORDER — LORAZEPAM 2 MG/ML IJ SOLN
0.5000 mg | Freq: Four times a day (QID) | INTRAMUSCULAR | Status: DC | PRN
  Administered 2021-05-07: 0.5 mg via INTRAVENOUS
  Filled 2021-05-06: qty 1

## 2021-05-06 MED ORDER — METOPROLOL TARTRATE 5 MG/5ML IV SOLN: 5.0000 mg | Freq: Four times a day (QID) | INTRAVENOUS | Status: DC | PRN

## 2021-05-06 MED ORDER — SODIUM CHLORIDE 0.9 % IV SOLN: Freq: Once | INTRAVENOUS | Status: AC

## 2021-05-06 MED ORDER — ACETAMINOPHEN 325 MG PO TABS: 650.0000 mg | ORAL_TABLET | Freq: Four times a day (QID) | ORAL | Status: DC | PRN

## 2021-05-06 MED ORDER — ACETAMINOPHEN 650 MG RE SUPP: 650.0000 mg | Freq: Four times a day (QID) | RECTAL | Status: DC | PRN

## 2021-05-06 MED ORDER — BISACODYL 10 MG RE SUPP
10.0000 mg | Freq: Once | RECTAL | Status: AC
  Administered 2021-05-06: 10 mg via RECTAL
  Filled 2021-05-06: qty 1
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
## 2021-05-06 NOTE — Progress Notes (Signed)
[employer]uring [employer] [facility]) Hospital Liaison Note:    Pt is a current hospice patient with [facility] terminal diagnosis of Acute on chronic systolic (congestive) heart failure. Patient was put on antibiotics for UTI on 9/16 and has been refusing all medications (per family) for the last week becoming increasingly agitated and hyper verbal. [place] home nurses visited several times over the weekend to support patient and family.  Family called [facility] triage this morning after patient fell and it was decided to transfer patient to ER via EMS for evaluation.   [facility] [place] will continue to follow this patient during this hospitalization and support as needed.   Please do not hesitate to outreach [facility] with any questions.     Gar Ponto, RN  [facility] HLT [phone]
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
## 2021-05-06 NOTE — ED Provider Notes (Signed)
[facility] DEPT Provider Note   CSN: [id number] Arrival date & time: 05/06/21  1056     History Chief Complaint  Patient presents with   Urinary Tract Infection   Altered Mental Status   Aggressive Behavior    Ashley [name] is a 75 y.o. female.  HPI For evaluation of possible urinary tract infection.  She was combative with EMS and required restraints.  She is unable to give any history.  Level 5 caveat-altered mental status    Past Medical History:  Diagnosis Date   Anxiety disorder    Arthritis LOWER BACK   Asthma    Bipolar 1 disorder ([place])    Carpal tunnel syndrome    CKD (chronic kidney disease), stage IV (HCC)    stage 3   Depression    Diabetes mellitus    type 2   DVT (deep venous thrombosis) (HCC)    Dyslipidemia    Dyspnea    with much activity   Gait disorder    GERD (gastroesophageal reflux disease)    Gout LEFT FOOT-  STABLE   H/O hiatal hernia    History of CVA (cerebrovascular accident) FOUND PER MRI 1994--  RESIDUAL MEMORY IMPAIRED   Hypercholesteremia    Memory difficulty 08/14/2016   OCD (obsessive compulsive disorder)    Stroke ([place])    2000 memory loss    SUI (stress urinary incontinence, female)    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    Patient Active Problem List   Diagnosis Date Noted   Depression 03/03/2021   Acute CHF (congestive heart failure) ([place]) 02/24/2021   Hypertensive urgency 02/24/2021   Elevated troponin 02/24/2021   CKD (chronic kidney disease) stage 5, GFR less than 15 ml/min ([place]) 02/24/2021   DNR (do not resuscitate) 02/24/2021   Symptomatic anemia 11/07/2020   Hematoma of left thigh 11/07/2020   Acute renal failure superimposed on stage 5 chronic kidney disease, not on chronic dialysis ([place]) 10/09/2020   AKI (acute kidney injury) ([place]) 10/09/2020   Supratherapeutic INR    Constipation 08/27/2020   Generalized abdominal pain 08/27/2020   Aortic atherosclerosis ([place]) by Chest CT  scan 01/2020 06/19/2020   Acute deep vein thrombosis (DVT) of femoral vein of right lower extremity ([place]) 03/29/2020   Cancer ([place]) 03/07/2020   History of lung cancer 03/07/2020   Acquired thrombophilia ([place]) 11/29/2019   Hypokalemia 06/30/2019   Right lower lobe pulmonary nodule 06/24/2019   Long term (current) use of insulin ([place]) 02/21/2019   Obesity (BMI 30.0-34.9) 01/06/2018   Diabetes mellitus type 2, uncontrolled ([place]) 12/17/2017   Peripheral arterial disease ([place]) 10/20/2017   Polyp of ascending colon    Mild nonproliferative diabetic retinopathy of both eyes without macular edema associated with type 2 diabetes mellitus ([place]) 06/09/2017   Senile purpura ([place]) 06/09/2017   Hyperlipidemia associated with type 2 diabetes mellitus ([place]) 06/08/2017   Memory difficulty 08/14/2016   Frequent falls 04/08/2016   Hypothyroidism 03/05/2016   GERD (gastroesophageal reflux disease) 03/05/2016   Gout 08/29/2015   Non compliance w medication regimen 02/20/2015   Bipolar depression ([place]) 11/06/2014   Vitamin D deficiency 12/01/2013   Medication management 12/01/2013   Abnormality of gait 03/10/2013   Carpal tunnel syndrome 10/25/2012   Diabetic neuropathy ([place]) 10/25/2012   Major depressive disorder, recurrent episode ([place]) 10/12/2009   Essential hypertension 10/12/2009   COPD 10/12/2009    Past Surgical History:  Procedure Laterality Date   [place]  CATARACT EXTRACTION     COLONOSCOPY WITH PROPOFOL N/A 10/01/2017   Procedure: COLONOSCOPY WITH PROPOFOL;  Surgeon: [name], MD;  Location: [facility];  Service: Endoscopy;  Laterality: N/A;   FUDUCIAL PLACEMENT N/A 06/24/2019   Procedure: PLACEMENT OF FUDUCIAL MARKERS;  Surgeon: Garner Nash, DO;  Location: [facility] OR;  Service: Thoracic;  Laterality: N/A;   [place]   VIDEO BRONCHOSCOPY WITH ENDOBRONCHIAL NAVIGATION N/A 06/24/2019   Procedure:  VIDEO BRONCHOSCOPY WITH ENDOBRONCHIAL NAVIGATION;  Surgeon: Garner Nash, DO;  Location: [facility];  Service: Thoracic;  Laterality: N/A;   VULVAR LESION REMOVAL  06/22/2012   Procedure: VULVAR LESION;  Surgeon: Selinda Orion, MD;  Location: [facility];  Service: Gynecology;  Laterality: N/A;  WIDE EXCISION VULVAR LESION    VULVECTOMY PARTIAL  DEC 1999     OB History   No obstetric history on file.     Family History  Problem Relation Age of Onset   Stroke Father    Heart failure Father    Heart disease Father    Hyperlipidemia Father    Hypertension Father    Hypertension Brother    Hyperlipidemia Mother    Stroke Mother    Colon cancer Neg Hx     Social History   Tobacco Use   Smoking status: Every Day    Packs/day: 2.00    Years: 27.00    Pack years: 54.00    Types: Cigarettes   Smokeless tobacco: Never   Tobacco comments:    Information on smoking cessation offered, pt refused information at this time  Vaping Use   Vaping Use: Never used  Substance Use Topics   Alcohol use: No    Alcohol/week: 0.0 standard drinks   Drug use: No    Home Medications Prior to Admission medications   Medication Sig Start Date End Date Taking? Authorizing Provider  acetaminophen (TYLENOL) 500 MG tablet Take 1,000 mg by mouth every 6 (six) hours as needed for mild pain or headache.    [provider]  amLODipine (NORVASC) 10 MG tablet Take 1 tablet (10 mg total) by mouth daily. 07/04/19   Cristal Deer, MD  atorvastatin (LIPITOR) 40 MG tablet Take 1 tablet (40 mg total) by mouth daily. 12/27/20   Liane Comber, NP  Cholecalciferol (VITAMIN D) 125 MCG (5000 UT) CAPS Take 5,000 Units by mouth in the morning and at bedtime.    [provider]  clorazepate (TRANXENE-T) 7.5 MG tablet Take    1/2 to 1 tablet    1 or 2 x /day    ONLY if needed for  Nerves Patient taking differently: Take 3.75-7.5 mg by mouth 2 (two) times daily as needed for anxiety.  Take 3.75-7.74m once to twice daily as needed for nerves 05/22/20   MUnk Pinto MD  famotidine (PEPCID) 20 MG tablet TAKE 1 TABLET TWICE A DAY WITH MEALS FOR INDIGESTION. Patient taking differently: Take 20 mg by mouth 2 (two) times daily. 11/26/20   CLiane Comber NP  fenofibrate (TRICOR) 145 MG tablet TAKE 1 TABLET DAILY FOR TRIGLYCERIDES (BLOOD FATS) Patient taking differently: Take 145 mg by mouth daily. 02/19/21   CLiane Comber NP  ferrous sulfate 325 (65 FE) MG tablet TAKE 1 TABLET 2 TIMES A DAY WITH A MEAL FOR IRON DEFICIENCY Patient taking differently: Take 325 mg by mouth 2 (two) times daily with a meal. 02/28/21  Magda Bernheim, NP  FLUoxetine (PROZAC) 20 MG capsule Take 20 mg by mouth daily.    [provider]  glimepiride (AMARYL) 4 MG tablet TAKE 1/2 TO 1 TABLET TWICE DAILY WITH MEALS FOR DAIBETES 05/01/21   Unk Pinto, MD  hydrALAZINE (APRESOLINE) 100 MG tablet Take 1 tablet (100 mg total) by mouth every 8 (eight) hours. 10/14/20 06/22/21  Terrilee Croak, MD  hydrOXYzine (ATARAX/VISTARIL) 25 MG tablet Take 25 mg by mouth every 6 (six) hours as needed for anxiety. 04/03/21   [provider]  levothyroxine (SYNTHROID) 88 MCG tablet TAKE 1 TABLET DAILY ON AN EMPTY STOMACH WITH ONLY WATER FOR 30 MINUTES & NO ANTACID MEDS, CALCIUM OR MAGNESIUM FOR 4 HOURS & AVOID BIOTIN Patient taking differently: Take 88 mcg by mouth daily before breakfast. Take  1 tablet  Daily  on an empty stomach with only water for 30 minutes & no Antacid meds, Calcium or Magnesium for 4 hours & avoid Biotin 02/26/21   Magda Bernheim, NP  nystatin cream (MYCOSTATIN) Apply 1 application topically daily as needed for dry skin. 10/17/20   [provider]  [facility] ULTRA test strip TEST BLOOD SUGAR 3 TIMES A DAY 01/27/20   Vicie Mutters R, PA-C  polyethylene glycol (MIRALAX / GLYCOLAX) 17 g packet Take 17 g by mouth daily. 03/06/20   Vladimir Crofts, PA-C  potassium chloride (KLOR-CON) 10 MEQ  tablet Take 10 mEq by mouth 3 (three) times daily.    [provider]  torsemide (DEMADEX) 100 MG tablet Take 1 tablet Daily - In case of worsening leg swelling or shortness of breath take twice daily. 04/23/21   Unk Pinto, MD  vitamin C (ASCORBIC ACID) 500 MG tablet Take 500 mg by mouth daily. Take with iron/ferrous sulfate    [provider]    Allergies    Sulfa antibiotics and Sulfacetamide sodium  Review of Systems   Review of Systems  Unable to perform ROS: Mental status change   Physical Exam Updated Vital Signs BP (!) 148/83   Pulse (!) 103   Temp (!) 97 F (36.1 C) (Rectal)   Resp (!) 21   Ht 5' 5"  (1.651 m)   Wt 64 kg   SpO2 96%   BMI 23.48 kg/m   Physical Exam Vitals and nursing note reviewed.  Constitutional:      General: She is not in acute distress.    Appearance: She is well-developed. She is not ill-appearing, toxic-appearing or diaphoretic.  HENT:     Head: Normocephalic and atraumatic.     Right Ear: External ear normal.     Left Ear: External ear normal.  Eyes:     Conjunctiva/sclera: Conjunctivae normal.     Pupils: Pupils are equal, round, and reactive to light.  Neck:     Trachea: Phonation normal.  Cardiovascular:     Rate and Rhythm: Normal rate and regular rhythm.     Heart sounds: Normal heart sounds.  Pulmonary:     Effort: Pulmonary effort is normal.     Breath sounds: Normal breath sounds.  Abdominal:     Palpations: Abdomen is soft.     Tenderness: There is no abdominal tenderness.  Musculoskeletal:        General: Normal range of motion.     Cervical back: Normal range of motion and neck supple.  Skin:    General: Skin is warm and dry.  Neurological:     Mental Status: She is alert  and oriented to person, place, and time.     Cranial Nerves: No cranial nerve deficit.     Sensory: No sensory deficit.     Motor: No abnormal muscle tone.     Coordination: Coordination normal.  Psychiatric:        Behavior:  Behavior normal.        Thought Content: Thought content normal.        Judgment: Judgment normal.    ED Results / Procedures / Treatments   Labs (all labs ordered are listed, but only abnormal results are displayed) Labs Reviewed  COMPREHENSIVE METABOLIC PANEL - Abnormal; Notable for the following components:      Result Value   Potassium 3.1 (*)    CO2 21 (*)    Glucose, Bld 438 (*)    BUN 99 (*)    Creatinine, Ser 4.96 (*)    Albumin 3.2 (*)    Alkaline Phosphatase 36 (*)    GFR, Estimated 9 (*)    Anion gap 19 (*)    All other components within normal limits  CBC - Abnormal; Notable for the following components:   RBC 3.17 (*)    Hemoglobin 9.4 (*)    HCT 28.6 (*)    All other components within normal limits  URINALYSIS, ROUTINE W REFLEX MICROSCOPIC - Abnormal; Notable for the following components:   APPearance TURBID (*)    Glucose, UA 500 (*)    Hgb urine dipstick SMALL (*)    Protein, ur >=300 (*)    Leukocytes,Ua MODERATE (*)    WBC, UA >50 (*)    Bacteria, UA MANY (*)    All other components within normal limits  CBG MONITORING, ED - Abnormal; Notable for the following components:   Glucose-Capillary 381 (*)    All other components within normal limits  RESP PANEL BY RT-PCR (FLU A&B, COVID) [id number]  CULTURE, BLOOD (ROUTINE X 2)  CULTURE, BLOOD (ROUTINE X 2)  URINE CULTURE  PROTIME-INR  LACTIC ACID, PLASMA  AMMONIA  ETHANOL  LACTIC ACID, PLASMA  MAGNESIUM    EKG None  Radiology No results found.  Procedures Fecal disimpaction  Date/Time: 05/06/2021 3:12 PM Performed by: Daleen Bo, MD Authorized by: Daleen Bo, MD  Consent: Verbal consent obtained. Consent given by: patient Local anesthesia used: no  Anesthesia: Local anesthesia used: no  Sedation: Patient sedated: no  Patient tolerance: patient tolerated the procedure well with no immediate complications Comments: Removed a bolus of fecal matter, brown, firm, about 5 x 5 x 4  cm.   .Critical Care Performed by: Daleen Bo, MD Authorized by: Daleen Bo, MD   Critical care provider statement:    Critical care time (minutes):  45   Critical care start time:  05/06/2021 1:45 PM   Critical care end time:  05/06/2021 3:25 PM   Critical care time was exclusive of:  Separately billable procedures and treating other patients   Critical care was necessary to treat or prevent imminent or life-threatening deterioration of the following conditions:  Metabolic crisis, sepsis and CNS failure or compromise   Critical care was time spent personally by me on the following activities:  Blood draw for specimens, development of treatment plan with patient or surrogate, discussions with consultants, evaluation of patient's response to treatment, examination of patient, obtaining history from patient or surrogate, ordering and performing treatments and interventions, ordering and review of laboratory studies, pulse oximetry, re-evaluation of patient's condition, review of old charts and ordering and review  of radiographic studies   Medications Ordered in ED Medications  cefTRIAXone (ROCEPHIN) 1 g in sodium chloride 0.9 % 100 mL IVPB (has no administration in time range)  potassium chloride 10 mEq in 100 mL IVPB (has no administration in time range)  haloperidol lactate (HALDOL) injection 2 mg (2 mg Intravenous Given 05/06/21 1152)  LORazepam (ATIVAN) injection 1 mg (1 mg Intravenous Given 05/06/21 1152)  sodium chloride 0.9 % bolus 1,000 mL (0 mLs Intravenous Stopped 05/06/21 1500)  bisacodyl (DULCOLAX) suppository 10 mg (10 mg Rectal Given 05/06/21 1354)    ED Course  I have reviewed the triage vital signs and the nursing notes.  Pertinent labs & imaging results that were available during my care of the patient were reviewed by me and considered in my medical decision making (see chart for details).  Clinical Course as of 05/06/21 1527  Mon May 06, 2021  1510 The patient's  husband reported to the nurse that the patient is not taking her Cipro which has been prescribed for UTI.  He is currently at work. [EW]  9628 At this time the patient has not voided after receiving bisacodyl suppository.  I proceeded with disimpaction. [EW]    Clinical Course User Index [EW] Daleen Bo, MD   MDM Rules/Calculators/A&P                            Patient Vitals for the past 24 hrs:  BP Temp Temp src Pulse Resp SpO2 Height Weight  05/06/21 1500 (!) 148/83 -- -- (!) 103 (!) 21 96 % -- --  05/06/21 1445 (!) 148/62 -- -- (!) 102 (!) 22 95 % -- --  05/06/21 1426 115/74 -- -- (!) 103 15 95 % -- --  05/06/21 1400 (!) 119/54 -- -- (!) 106 16 92 % -- --  05/06/21 1345 130/69 -- -- (!) 105 18 94 % -- --  05/06/21 1336 (!) 154/81 -- -- (!) 103 (!) 21 95 % -- --  05/06/21 1330 (!) 165/99 -- -- (!) 109 (!) 25 96 % -- --  05/06/21 1300 122/80 -- -- (!) 105 (!) 24 94 % -- --  05/06/21 1250 101/86 -- -- (!) 105 (!) 23 95 % -- --  05/06/21 1204 111/83 -- -- 100 (!) 22 96 % -- --  05/06/21 1203 (!) 139/100 -- -- 100 19 95 % -- --  05/06/21 1202 140/72 -- -- (!) 102 16 95 % -- --  05/06/21 1200 135/78 -- -- (!) 102 (!) 23 96 % -- --  05/06/21 1115 -- -- -- -- -- -- 5' 5"  (1.651 m) 64 kg  05/06/21 1109 (!) 144/81 (!) 97 F (36.1 C) Rectal (!) 106 (!) 22 98 % -- --    3:13 PM Reevaluation with update and discussion. After initial assessment and treatment, an updated evaluation reveals she remains confused. Daleen Bo   Medical Decision Making:  This patient is presenting for evaluation of confusion and probable urinary tract infection, which does require a range of treatment options, and is a complaint that involves a high risk of morbidity and mortality. The differential diagnoses include UTI, sepsis, exacerbation of psychiatric illness, medication intolerance. I decided to review old records, and in summary elderly female who lives at home with her husband, presenting for  nonspecific symptoms requiring comprehensive evaluation.  History of diabetes, bipolar depression, COPD, hypertension and chronic kidney disease.  She is listed as DNR.  I obtained additional historical information from patient's husband.  Clinical Laboratory Tests Ordered, included CBC, Metabolic panel, Urinalysis, and lactate, blood pressure, alcohol level, ammonia level, PT/INR . Review indicates normal except urinalysis indicating infection, potassium low, CO2 low, glucose high, creatinine high, BUN high, Albumin low , Alk. Phos. Low, GFR low, hemoglobin low.   Critical Interventions-clinical evaluation, sedation medications, laboratory testing, IV fluids, empiric antibiotics, fecal disimpaction, observation and reassessment  After These Interventions, the Patient was reevaluated and was found with fecal impaction and urinary tract infection.  Also incidental hypokalemia.  She is not vomiting or having diarrhea.  She require hospitalization for management and stabilization.  CRITICAL CARE-yes Performed by: Daleen Bo  Nursing Notes Reviewed/ Care Coordinated Applicable Imaging Reviewed Interpretation of Laboratory Data incorporated into ED treatment   3:27 PM-Consult complete with hospitalist. Patient case explained and discussed.  He agrees to admit patient for further evaluation and treatment. Call ended at 3:55 PM    Final Clinical Impression(s) / ED Diagnoses Final diagnoses:  Urinary tract infection without hematuria, site unspecified  Hypokalemia  Altered mental status, unspecified altered mental status type  Fecal impaction [facility])    Rx / DC Orders ED Discharge Orders     None        Daleen Bo, MD 05/06/21 1555

## 2021-05-06 NOTE — ED Notes (Signed)
Ashley [name], EDP at bedside.  Patient screaming, combative, cussing staff, and attempting to bite staff.

## 2021-05-06 NOTE — ED Notes (Signed)
This nurse at bedside to assess Ashley [name], EDP to disimpact patient. Large amount of stool removed.

## 2021-05-07 DIAGNOSIS — E440 Moderate protein-calorie malnutrition: Secondary | ICD-10-CM | POA: Insufficient documentation

## 2021-05-07 DIAGNOSIS — N390 Urinary tract infection, site not specified: Secondary | ICD-10-CM

## 2021-05-07 DIAGNOSIS — E876 Hypokalemia: Secondary | ICD-10-CM

## 2021-05-07 DIAGNOSIS — K5641 Fecal impaction: Secondary | ICD-10-CM

## 2021-05-07 DIAGNOSIS — R0600 Dyspnea, unspecified: Secondary | ICD-10-CM

## 2021-05-07 LAB — COMPREHENSIVE METABOLIC PANEL
ALT: 16 U/L (ref 0–44)
AST: 17 U/L (ref 15–41)
Albumin: 3.1 g/dL — ABNORMAL LOW (ref 3.5–5.0)
Alkaline Phosphatase: 34 U/L — ABNORMAL LOW (ref 38–126)
Anion gap: 18 — ABNORMAL HIGH (ref 5–15)
BUN: 92 mg/dL — ABNORMAL HIGH (ref 8–23)
CO2: 17 mmol/L — ABNORMAL LOW (ref 22–32)
Calcium: 8.6 mg/dL — ABNORMAL LOW (ref 8.9–10.3)
Chloride: 96 mmol/L — ABNORMAL LOW (ref 98–111)
Creatinine, Ser: 4.39 mg/dL — ABNORMAL HIGH (ref 0.44–1.00)
GFR, Estimated: 10 mL/min — ABNORMAL LOW (ref >60)
Glucose, Bld: 339 mg/dL — ABNORMAL HIGH (ref 70–99)
Potassium: 3.3 mmol/L — ABNORMAL LOW (ref 3.5–5.1)
Sodium: 131 mmol/L — ABNORMAL LOW (ref 135–145)
Total Bilirubin: 0.5 mg/dL (ref 0.3–1.2)
Total Protein: 6.3 g/dL — ABNORMAL LOW (ref 6.5–8.1)

## 2021-05-07 LAB — GLUCOSE, CAPILLARY
Glucose-Capillary: 279 mg/dL — ABNORMAL HIGH (ref 70–99)
Glucose-Capillary: 271 mg/dL — ABNORMAL HIGH (ref 70–99)
Glucose-Capillary: 322 mg/dL — ABNORMAL HIGH (ref 70–99)
Glucose-Capillary: 301 mg/dL — ABNORMAL HIGH (ref 70–99)

## 2021-05-07 LAB — CBC
HCT: 25.3 % — ABNORMAL LOW (ref 36.0–46.0)
Hemoglobin: 8.4 g/dL — ABNORMAL LOW (ref 12.0–15.0)
MCH: 29.5 pg (ref 26.0–34.0)
MCHC: 33.2 g/dL (ref 30.0–36.0)
MCV: 88.8 fL (ref 80.0–100.0)
Platelets: 295 10*3/uL (ref 150–400)
RBC: 2.85 MIL/uL — ABNORMAL LOW (ref 3.87–5.11)
RDW: 13.2 % (ref 11.5–15.5)
WBC: 8.5 10*3/uL (ref 4.0–10.5)
nRBC: 0 % (ref 0.0–0.2)

## 2021-05-07 MED ORDER — LEVOTHYROXINE SODIUM 88 MCG PO TABS: 88.0000 ug | ORAL_TABLET | Freq: Every day | ORAL | Status: DC

## 2021-05-07 MED ORDER — MELATONIN 5 MG PO TABS: 5.0000 mg | ORAL_TABLET | Freq: Every evening | ORAL | Status: DC | PRN

## 2021-05-07 MED ORDER — FLUOXETINE HCL 20 MG PO CAPS
20.0000 mg | ORAL_CAPSULE | Freq: Every day | ORAL | Status: DC
  Administered 2021-05-07 – 2021-05-08 (×2): 20 mg via ORAL
  Filled 2021-05-07 (×2): qty 1

## 2021-05-07 MED ORDER — FAMOTIDINE 20 MG PO TABS
20.0000 mg | ORAL_TABLET | ORAL | Status: DC
  Administered 2021-05-08: 20 mg via ORAL
  Filled 2021-05-07: qty 1

## 2021-05-07 MED ORDER — FENOFIBRATE 160 MG PO TABS
160.0000 mg | ORAL_TABLET | Freq: Every day | ORAL | Status: DC
Start: 2021-05-07 — End: 2021-05-09
  Administered 2021-05-07 – 2021-05-08 (×2): 160 mg via ORAL
  Filled 2021-05-07 (×2): qty 1

## 2021-05-07 MED ORDER — HYDROXYZINE HCL 25 MG PO TABS: 25.0000 mg | ORAL_TABLET | Freq: Four times a day (QID) | ORAL | Status: DC | PRN

## 2021-05-07 MED ORDER — AMLODIPINE BESYLATE 10 MG PO TABS
10.0000 mg | ORAL_TABLET | Freq: Every day | ORAL | Status: DC
  Administered 2021-05-07 – 2021-05-08 (×2): 10 mg via ORAL
  Filled 2021-05-07 (×3): qty 1

## 2021-05-07 MED ORDER — INSULIN ASPART 100 UNIT/ML IJ SOLN
0.0000 [IU] | Freq: Three times a day (TID) | INTRAMUSCULAR | Status: DC
  Administered 2021-05-07 – 2021-05-08 (×2): 7 [IU] via SUBCUTANEOUS
  Administered 2021-05-08: 3 [IU] via SUBCUTANEOUS
  Administered 2021-05-08: 5 [IU] via SUBCUTANEOUS

## 2021-05-07 MED ORDER — HYDRALAZINE HCL 50 MG PO TABS
100.0000 mg | ORAL_TABLET | Freq: Two times a day (BID) | ORAL | Status: DC
  Administered 2021-05-07 – 2021-05-08 (×3): 100 mg via ORAL
  Filled 2021-05-07 (×3): qty 2

## 2021-05-07 MED ORDER — POTASSIUM CHLORIDE CRYS ER 20 MEQ PO TBCR
20.0000 meq | EXTENDED_RELEASE_TABLET | Freq: Once | ORAL | Status: AC
  Administered 2021-05-07: 20 meq via ORAL
  Filled 2021-05-07: qty 1

## 2021-05-07 MED ORDER — POLYETHYLENE GLYCOL 3350 17 G PO PACK
17.0000 g | PACK | Freq: Two times a day (BID) | ORAL | Status: DC
  Filled 2021-05-07 (×2): qty 1

## 2021-05-07 MED ORDER — ENSURE ENLIVE PO LIQD: 237.0000 mL | Freq: Two times a day (BID) | ORAL | Status: DC

## 2021-05-07 MED ORDER — INSULIN ASPART 100 UNIT/ML IJ SOLN
0.0000 [IU] | Freq: Every day | INTRAMUSCULAR | Status: DC
  Administered 2021-05-07: 4 [IU] via SUBCUTANEOUS

## 2021-05-07 MED ORDER — ADULT MULTIVITAMIN W/MINERALS CH
1.0000 | ORAL_TABLET | Freq: Every day | ORAL | Status: DC
  Administered 2021-05-07 – 2021-05-08 (×2): 1 via ORAL
  Filled 2021-05-07 (×2): qty 1

## 2021-05-07 MED ORDER — FAMOTIDINE 20 MG PO TABS: 20.0000 mg | ORAL_TABLET | Freq: Two times a day (BID) | ORAL | Status: DC

## 2021-05-07 MED ORDER — ATORVASTATIN CALCIUM 40 MG PO TABS
40.0000 mg | ORAL_TABLET | Freq: Every day | ORAL | Status: DC
  Administered 2021-05-07 – 2021-05-08 (×2): 40 mg via ORAL
  Filled 2021-05-07 (×2): qty 1

## 2021-05-07 NOTE — Progress Notes (Addendum)
PROGRESS NOTE   Ashley [name]  GXQ:[id number]    DOB: February 22, 1946    DOA: 05/06/2021  PCP: Unk Pinto, MD   I have briefly reviewed patients previous medical records in [facility].  Chief Complaint  Patient presents with   Urinary Tract Infection   Altered Mental Status   Aggressive Behavior    Brief Narrative:  75 year old female with medical history significant for type II DM, CKD stage V, hypertension, hyperlipidemia, chronic systolic CHF, on home hospice for CHF and CKD diagnosis, presented with complaints of poor oral intake over the last 2 weeks, worsened over the 3 days PTA to the point where she was not taking any p.o.'s at all, increasingly confused and aggressive over that time.  Mentation worsened and she even sustained a fall, spouse spoke with hospice care [place] patient presented to ED.  Large amount of stool disimpacted in ED by EDP.  Admitted for acute metabolic encephalopathy likely secondary to UTI.   Assessment & Plan:  Active Problems:   Acute metabolic encephalopathy   Acute metabolic encephalopathy: - Possibly multifactorial but mainly due to UTI. - On presentation had been aggressive with biting and hitting staff. - As per report, her mentation had been declining over the last couple of weeks and more so over the last couple of days PTA. - As per admitting MDs discussion with spouse, he was interested in residential hospice but wanted to try antibiotics, fluids overnight and if not improved then he would consider switching to comfort measures with the idea of getting her home or to residential hospice.  Admitting MD contacted [place] to have improved/may be even resolved.  Continue to monitor.  Acute cystitis - Urine culture >100 K colonies of Enterobacter aerogenes - Continue empiric IV ceftriaxone pending sensitivity results.  Chronic dyspnea - Reports chronic dyspnea on mild exertion since diagnosis of stroke a couple of months  ago. - Lowest oxygen saturation of 91% documented.  Remains on 2 L/min nasal cannula oxygen. - CT chest 01/26/2020: Radiation changes within the right lower and adjacent right middle lobes, without residual or recurrent right lower lobe lesion.  No evidence of thoracic adenopathy, similar 4 mm LLL pulmonary nodule. - Chest x-ray 9/2: Right midlung mass, right lower lobe atelectasis. 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  Type II DM with hyperglycemia - Adjust SSI and could consider low-dose Lantus.  Hypokalemia - Gently replace and follow BMP.  CKD stage V/anion gap metabolic acidosis - Creatinine at sign.  Patient reportedly has declined hemodialysis  Anemia of chronic disease/chronic kidney disease - Relatively stable.  Hyperlipidemia - Holding statins  Essential hypertension - Labile and mildly uncontrolled at times.  On as needed metoprolol.  Hypothyroidism - Continue home meds  Chronic systolic CHF - Appears to be either euvolemic or somewhat dry.  Fecal impaction/constipation - S/p disimpaction by EDP. - Aggressive bowel regimen.  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    DVT prophylaxis: SCDs Start: 05/06/21 2202     Code Status: DNR Family Communication: I discussed in detail with patient's brother via phone.  He tells me that he is the healthcare power of attorney.  He advised me that patient's mental status is significantly improved, not agitated anymore but still confused at times.  He also indicated that the patient told him that she does not want any further intervention  and willing to sign the MOST form. Disposition:  Status is: Inpatient  Remains inpatient appropriate because:Inpatient level of care appropriate due to severity of illness  Dispo: The patient is from: Home              Anticipated d/c is to: Home               Patient currently is not medically stable to d/c.   Difficult to place patient No        Consultants:     Procedures:     Antimicrobials:    Anti-infectives (From admission, onward)    Start     Dose/Rate Route Frequency Ordered Stop   05/07/21 1000  cefTRIAXone (ROCEPHIN) 1 g in sodium chloride 0.9 % 100 mL IVPB        1 g 200 mL/hr over 30 Minutes Intravenous Every 24 hours 05/06/21 2201     05/06/21 1530  cefTRIAXone (ROCEPHIN) 1 g in sodium chloride 0.9 % 100 mL IVPB        1 g 200 mL/hr over 30 Minutes Intravenous  Once 05/06/21 1522 05/06/21 1607         Subjective:  Patient interviewed and examined along with her female RN at bedside.  Dysuria better.  Itchy right groin rash.  Reports wearing chronic depends at home.  Ongoing chronic dyspnea since stroke several months ago.  Recently on home oxygen at 2 L/min.  Chronic left-sided weakness.  Seen sitting up and eating breakfast by herself.  Objective:   Vitals:   05/06/21 2130 05/07/21 0025 05/07/21 0411 05/07/21 1358  BP: (!) 192/107 (!) 148/72 (!) 132/92 (!) 149/70  Pulse: 95 88 94 94  Resp: (!) 22 16 (!) 21   Temp:  97.7 F (36.5 C) 98.2 F (36.8 C) (!) 97.5 F (36.4 C)  TempSrc:  Oral Oral Oral  SpO2: 95% 92% 100% 99%  Weight:      Height:        General exam: Elderly female, small built, frail and chronically ill looking with mild dyspnea Respiratory system: Reduced breath sounds bilaterally in the bases.  No wheezing, rhonchi or crackles.  Mild increased work of breathing. Cardiovascular system: S1 & S2 heard, RRR. No JVD, murmurs, rubs, gallops or clicks. No pedal edema.  Telemetry personally reviewed: Sinus rhythm. Gastrointestinal system: Abdomen is nondistended, soft and nontender. No organomegaly or masses felt. Normal bowel sounds heard. Central nervous system: Alert and oriented. No focal neurological deficits. Extremities: Right limbs grade 5 x 5 power.  Left lower extremity at least grade 3  x 5 power.  Left upper extremity grade 2 x 5 power proximally and 1 x 5 power distally. Skin: Right groin fungal appearing rash. Psychiatry: Judgement and insight appear impaired. Mood & affect appropriate.     Data Reviewed:   I have personally reviewed following labs and imaging studies   CBC: Recent Labs  Lab 05/06/21 1125 05/07/21 0359  WBC 7.5 8.5  HGB 9.4* 8.4*  HCT 28.6* 25.3*  MCV 90.2 88.8  PLT 344 741    Basic Metabolic Panel: Recent Labs  Lab 05/06/21 1125 05/06/21 1817 05/07/21 0359  NA 138 135 131*  K 3.1* 3.7 3.3*  CL 98 96* 96*  CO2 21* 21* 17*  GLUCOSE 438* 337* 339*  BUN 99* 97* 92*  CREATININE 4.96* 4.49* 4.39*  CALCIUM 9.2 8.6* 8.6*  MG 2.6*  --   --     Liver Function Tests: Recent  Labs  Lab 05/06/21 1125 05/06/21 1817 05/07/21 0359  AST 19 20 17   ALT 18 19 16   ALKPHOS 36* 35* 34*  BILITOT 0.9 0.7 0.5  PROT 6.7 6.6 6.3*  ALBUMIN 3.2* 3.1* 3.1*    CBG: Recent Labs  Lab 05/06/21 2139 05/07/21 0757 05/07/21 1120  GLUCAP 280* 279* 271*    Microbiology Studies:   Recent Results (from the past 240 hour(s))  Blood culture (routine x 2)     Status: None (Preliminary result)   Collection Time: 05/06/21 11:25 AM   Specimen: BLOOD  Result Value Ref Range Status   Specimen Description   Final    BLOOD BLOOD RIGHT FOREARM Performed at [facility], [place] [address] [place], [place] [address]    Special Requests   Final    BOTTLES DRAWN AEROBIC AND ANAEROBIC Blood Culture adequate volume Performed at [facility] [address] [place], [place] [address]    Culture   Final    NO GROWTH < 24 HOURS Performed at [facility] [address] [place], [place] [address]    Report Status PENDING  Incomplete  Blood culture (routine x 2)     Status: None (Preliminary result)   Collection Time: 05/06/21 11:33 AM   Specimen: BLOOD  Result Value Ref Range Status   Specimen Description   Final     BLOOD BLOOD RIGHT FOREARM UPPER Performed at [facility] [address] [place], [place] [address]    Special Requests   Final    BOTTLES DRAWN AEROBIC AND ANAEROBIC Blood Culture adequate volume Performed at [place] [facility] [address], [place], [place] [address]    Culture   Final    NO GROWTH < 24 HOURS Performed at [facility] [address], [place], [place] [address]    Report Status PENDING  Incomplete  Resp Panel by RT-PCR (Flu A&B, Covid) Nasopharyngeal Swab     Status: None   Collection Time: 05/06/21 12:00 PM   Specimen: Nasopharyngeal Swab; Nasopharyngeal(NP) swabs in vial transport medium  Result Value Ref Range Status   SARS Coronavirus 2 by RT PCR NEGATIVE NEGATIVE Final    Comment: (NOTE) SARS-CoV-2 target nucleic acids are NOT DETECTED.  The SARS-CoV-2 RNA is generally detectable in upper respiratory specimens during the acute phase of infection. The lowest concentration of SARS-CoV-2 viral copies this assay can detect is 138 copies/mL. A negative result does not preclude SARS-Cov-2 infection and should not be used as the sole basis for treatment or other patient management decisions. A negative result may occur with  improper specimen collection/handling, submission of specimen other than nasopharyngeal swab, presence of viral mutation(s) within the areas targeted by this assay, and inadequate number of viral copies(<138 copies/mL). A negative result must be combined with clinical observations, patient history, and epidemiological information. The expected result is Negative.  Fact Sheet for Patients:  EntrepreneurPulse.com.au  Fact Sheet for Healthcare Providers:  IncredibleEmployment.be  This test is no t yet approved or cleared by the [place] FDA and  has been authorized for detection and/or diagnosis of SARS-CoV-2 by FDA under an Emergency Use Authorization  (EUA). This EUA will remain  in effect (meaning this test can be used) for the duration of the COVID-19 declaration under Section 564(b)(1) of the Act, 21 U.S.C.section 360bbb-3(b)(1), unless the authorization is terminated  or revoked sooner.       Influenza A by PCR NEGATIVE NEGATIVE  Final   Influenza B by PCR NEGATIVE NEGATIVE Final    Comment: (NOTE) The Xpert Xpress SARS-CoV-2/FLU/RSV plus assay is intended as an aid in the diagnosis of influenza from Nasopharyngeal swab specimens and should not be used as a sole basis for treatment. Nasal washings and aspirates are unacceptable for Xpert Xpress SARS-CoV-2/FLU/RSV testing.  Fact Sheet for Patients: EntrepreneurPulse.com.au  Fact Sheet for Healthcare Providers: IncredibleEmployment.be  This test is not yet approved or cleared by the [place] FDA and has been authorized for detection and/or diagnosis of SARS-CoV-2 by FDA under an Emergency Use Authorization (EUA). This EUA will remain in effect (meaning this test can be used) for the duration of the COVID-19 declaration under Section 564(b)(1) of the Act, 21 U.S.C. section 360bbb-3(b)(1), unless the authorization is terminated or revoked.  Performed at [facility], [place] [address] [place], [place] [address]   Urine Culture     Status: Abnormal (Preliminary result)   Collection Time: 05/06/21  1:34 PM   Specimen: Urine, Catheterized  Result Value Ref Range Status   Specimen Description   Final    URINE, CATHETERIZED Performed at [facility] [address], [place], [place] [address]    Special Requests   Final    NONE Performed at [facility], [place] [address] [place], [place] [address]    Culture (A)  Final    >=100,000 COLONIES/mL ENTEROBACTER AEROGENES SUSCEPTIBILITIES TO FOLLOW Performed at [facility], [place] [address] [place], [place] [address]    Report  Status PENDING  Incomplete     Radiology Studies:  No results found.   Scheduled Meds:    insulin aspart  0-6 Units Subcutaneous TID WC    Continuous Infusions:    cefTRIAXone (ROCEPHIN)  IV Stopped (05/07/21 1104)     LOS: 1 day     Vernell Leep, MD, [place], [facility]. [facility]    To contact the attending provider between 7A-7P or the covering provider during after hours 7P-7A, please log into the web site [email or web address] and access using universal [place] password for that web site. If you do not have the password, please call the hospital operator.  05/07/2021, 2:29 PM

## 2021-05-07 NOTE — Progress Notes (Signed)
PHARMACY NOTE:  RENAL DOSAGE ADJUSTMENT   Current dosage:  Famotidine 20mg  po BID   Renal Function: Estimated Creatinine Clearance: 10.3 mL/min (A) (by C-G formula based on SCr of 4.39 mg/dL (H)).     Antimicrobial dosage has been changed to:  Famotidine 20mg  po every other day (adjusted for CrCl < 30 ml/min)   Thank you for allowing pharmacy to be a part of this patient's care.  Everette Rank, [facility] 05/07/2021 3:17 PM

## 2021-05-07 NOTE — Progress Notes (Signed)
Initial Nutrition Assessment  DOCUMENTATION CODES:   Non-severe (moderate) malnutrition in context of chronic illness  INTERVENTION:  - will order Ensure Plus BID, each supplement provides 350 kcal and 13 grams of protein. - will order Magic Cup BID with meals, each supplement provides 290 kcal and 9 grams of protein. - will order 1 tablet multivitamin with minerals/day.   NUTRITION DIAGNOSIS:   Moderate Malnutrition related to chronic illness (CHF, CKD) as evidenced by moderate fat depletion, moderate muscle depletion.  GOAL:   Patient will meet greater than or equal to 90% of their needs  MONITOR:   PO intake, Supplement acceptance, Labs, Weight trends, I & O's  REASON FOR ASSESSMENT:   Malnutrition Screening Tool  ASSESSMENT:   75 year old female with medical history of type 2 DM, stage 4 CKD, HTN, HLD, CHF. She is on home hospice d/t CHF and CKD. She presented to the ED due to poor oral intake x2 weeks which worsened to not eating at all for the 3 days PTA, increasing confusion, and increasing aggression. She had large stool burden and was disimpacted in the ED. She was admitted for acute metabolic encephalopathy thought to be 2/2 UTI.  Patient sitting up in bed starting lunch with caregiver at bedside at the time of RD visit. A friend came to visit shortly after RD entered the room so RD and caregiver moved to the hallway to discuss.   Caregiver has been with patient for the past 2 weeks; following the time when she had a stroke, per caregiver. In the beginning, patient was eating very well but appetite and intakes progressively worsened to patient eating nothing for the 3 days PTA. Caregiver feels this was in part d/t worsening confusion and possibly not taking medications for depression and bipolar disorder.    Patient is able to feed herself today and at baseline. She has no known issues with chewing or swallowing, but in the few days PTA she was very fearful of choking.  Caregiver reports that patient has some OCD tendencies and that this is present with food as well.   At home patient was drinking chocolate milk and she is agreeable to strawberry or chocolate protein shakes here.   Weight yesterday was 141 lb and weight has been stable for the past 2 months. Weight on 11/07/20 was 155 lb. This indicates 14 lb weight loss (9% body weight) in the past 6 months.    Labs reviewed; CBGs: 279 and 271 mg/dl, Na: 131 mmol/l, K: 3.3 mmol/l, Cl: 96 mmol/l, BUN: 92 mg/dl, creatinine: 4.29 mg/dl, Ca: 8.6 mg/dl, GFR: 10 ml/min.  Medications reviewed; 20 mg oral pepcid BID, sliding scale novolog, 88 mcg oral synthroid/day, 17 g miralax BID, 10 mEq IV KCl x3 runs 9/19, 20 mEq Klor-Con x1 dose 9/20.      NUTRITION - FOCUSED PHYSICAL EXAM:  Flowsheet Row Most Recent Value  Orbital Region Moderate depletion  Upper Arm Region Moderate depletion  Thoracic and Lumbar Region Unable to assess  Buccal Region Moderate depletion  Temple Region Mild depletion  Clavicle Bone Region Moderate depletion  Clavicle and Acromion Bone Region Moderate depletion  Scapular Bone Region Unable to assess  Dorsal Hand Moderate depletion  Patellar Region Unable to assess  Anterior Thigh Region Unable to assess  Posterior Calf Region Unable to assess  Edema (RD Assessment) Unable to assess  Hair Reviewed  Eyes Reviewed  Mouth Unable to assess  Skin Reviewed  Nails Reviewed       Diet  Order:   Diet Order             Diet Carb Modified Fluid consistency: Thin; Room service appropriate? Yes  Diet effective now                   EDUCATION NEEDS:   Not appropriate for education at this time  Skin:  Skin Assessment: Reviewed RN Assessment  Last BM:     Height:   Ht Readings from Last 1 Encounters:  05/06/21 5\' 5"  (1.651 m)    Weight:   Wt Readings from Last 1 Encounters:  05/06/21 64 kg     Estimated Nutritional Needs:  Kcal:  1600-1800 kcal Protein:  80-90  grams Fluid:  >/= 1.5 L/day     Jarome Matin, MS, RD, LDN, CNSC Inpatient Clinical Dietitian RD pager # available in AMION  After hours/weekend pager # available in [facility]

## 2021-05-07 NOTE — Progress Notes (Signed)
[facility]) Hospitalized Hospice Patient (Pt) Note    [name] is a current hospice patient with [facility], admitted with a terminal diagnosis of Acute on chronic systolic (congestive) heart failure. Patient was started on PO antibiotics for UTI on 9/16 and has been refusing all medications (per family) for the last week, becoming increasingly agitated and hyper verbal. [facility] home nurses visited several times over the weekend to support patient and family.  Family called [facility] triage this morning after patient fell and it was decided to transfer patient to ER via EMS for evaluation. Pt was admitted to [facility] with a diagnosis of acute metabolic encephalopathy.  Per Dr. Orpah Melter, [facility] MD, this is a related admission.  Met with pt, brother and Delana Meyer, and paid caregiver Theo at bedside.  Exchanged report with Dr. Algis Liming via [place].  Pt Aox3 at [facility]. [place] liaison had been asked to review pt for appropriateness for inpatient hospice unit, ie [place]; this liaison reviewed chart with [place] MD DR. Hertweck.  Due to pt's positive response to IV therapies, pt is not appropriate for [facility] at this time.  Discussed this with pt and family at bedside.  Pt stated wish not to be treated in the future with IV antibiotics/fluids but to go from home to [facility] for symptom management.  Dr. Algis Liming made aware; MOST form left at bedside to be completed by MD.  Contact numbers given to Fritz Pickerel and Arnette Norris.  Discussed care with pt's homecare SW, [place] by phone.  This pt is appropriate for general inpatient status due to the need for ongoing IV therapies, including antibiotics.  Vital Signs: 97.5 oral, 184/97 (116), 94 HR, 21 RR, 99% 2L [place]  I&O's: 1249.4/300  Abnormal Labs: Na 131, K 3.3, Cl 96, CO2 17, Glucose 322, BUN 92, Creatnine 4.39, GFR 10, Ca 8.6,  Alk Phos 34, Albumin 3.1, tot prot 6.3, RBC 2.85, Hgb 8.4  IV meds/PRNs:  KCl 36mq PIV x 3 runs (now  complete); 0.9% NS 1L bolus PIV once; 0.9% NS @ 75 mL/hr, now discontinued; ceftriaxone 1g PIV q24h; ativan 25 mg PO q6h PRN anxiety x 1 dose.   Problem List:  Acute metabolic encephalopathy     - admit to inpt, progressive     - encephalopathy likely secondary to infection     - she's been aggressive with biting, hitting staff     - her mentation has been declining over the last couple of weeks; with a sharp decline over the last few days presumably secondary to infection     - long conversation w/ POA. He is interested in residential hospice. He would like [place] try abx, fluids for tonight, and if not improved in AM, switch to full comfort care measures w/ the idea of getting her home or to residential office     - I have contacted [facility] for options   UTI     - continue rocephin   DM2     - SSI, glucose checks, DM diet   Hypokalemia     - got 385m IV K+ in ED, repeat labs in AM, Mg2+ is slightly elevated   CKD5     - she is at baseline; watch nephrotoxins; she has declined HD   Anemia of chronic disease     - she is at baseline, no evidence of bleed   HLD     - hold statin   HTN     -  resume home regimen as able     - PRN metoprolol   Hypothyroidism     - resume home regimen as able   Chronic HFrEF     - not in exacerbation     - continue home regimen as able   Constipation     - s/p disimpaction w/ EDP     - bowel regimen  Discharge Planning: Pt asking about possibility of transfer to a facility, shares concerns about safety at home with husband.  Per pt's brother concerns are unfounded and part of paranoid symptoms, hx of same.  Pt is set up at home with paid caregivers. Pt would like to discuss placement in a facility with SW.  IDT: updated.  Goals of Care: Pt states desire for comfort care only.  Dr. Algis Liming made aware.  At time of discharge please use [facility] for transport as we contract this service for our active hospice pts.  Thank you for the  opportunity to participate in this pt's care.  Domenic Moras, BSN, RN [facility] Liaison [phone]

## 2021-05-08 LAB — BASIC METABOLIC PANEL
Anion gap: 14 (ref 5–15)
BUN: 96 mg/dL — ABNORMAL HIGH (ref 8–23)
CO2: 20 mmol/L — ABNORMAL LOW (ref 22–32)
Calcium: 8.8 mg/dL — ABNORMAL LOW (ref 8.9–10.3)
Chloride: 99 mmol/L (ref 98–111)
Creatinine, Ser: 4.42 mg/dL — ABNORMAL HIGH (ref 0.44–1.00)
GFR, Estimated: 10 mL/min — ABNORMAL LOW (ref >60)
Glucose, Bld: 275 mg/dL — ABNORMAL HIGH (ref 70–99)
Potassium: 3.4 mmol/L — ABNORMAL LOW (ref 3.5–5.1)
Sodium: 133 mmol/L — ABNORMAL LOW (ref 135–145)

## 2021-05-08 LAB — URINE CULTURE: Culture: >=100000 — AB

## 2021-05-08 LAB — CBC
HCT: 26.1 % — ABNORMAL LOW (ref 36.0–46.0)
Hemoglobin: 8.6 g/dL — ABNORMAL LOW (ref 12.0–15.0)
MCH: 29.6 pg (ref 26.0–34.0)
MCHC: 33 g/dL (ref 30.0–36.0)
MCV: 89.7 fL (ref 80.0–100.0)
Platelets: 286 10*3/uL (ref 150–400)
RBC: 2.91 MIL/uL — ABNORMAL LOW (ref 3.87–5.11)
RDW: 13.2 % (ref 11.5–15.5)
WBC: 7.9 10*3/uL (ref 4.0–10.5)
nRBC: 0.3 % — ABNORMAL HIGH (ref 0.0–0.2)

## 2021-05-08 LAB — GLUCOSE, CAPILLARY
Glucose-Capillary: 245 mg/dL — ABNORMAL HIGH (ref 70–99)
Glucose-Capillary: 323 mg/dL — ABNORMAL HIGH (ref 70–99)
Glucose-Capillary: 299 mg/dL — ABNORMAL HIGH (ref 70–99)

## 2021-05-08 MED ORDER — IPRATROPIUM-ALBUTEROL 0.5-2.5 (3) MG/3ML IN SOLN
3.0000 mL | RESPIRATORY_TRACT | Status: AC
  Administered 2021-05-08 (×2): 3 mL via RESPIRATORY_TRACT
  Filled 2021-05-08 (×2): qty 3

## 2021-05-08 MED ORDER — IPRATROPIUM-ALBUTEROL 0.5-2.5 (3) MG/3ML IN SOLN
3.0000 mL | Freq: Three times a day (TID) | RESPIRATORY_TRACT | Status: DC
  Administered 2021-05-08 (×2): 3 mL via RESPIRATORY_TRACT
  Filled 2021-05-08 (×2): qty 3

## 2021-05-08 MED ORDER — POTASSIUM CHLORIDE CRYS ER 20 MEQ PO TBCR
40.0000 meq | EXTENDED_RELEASE_TABLET | Freq: Once | ORAL | Status: AC
  Administered 2021-05-08: 40 meq via ORAL
  Filled 2021-05-08: qty 2

## 2021-05-08 MED ORDER — ALBUTEROL SULFATE (2.5 MG/3ML) 0.083% IN NEBU: 2.5000 mg | INHALATION_SOLUTION | RESPIRATORY_TRACT | Status: DC | PRN

## 2021-05-08 MED ORDER — HYDRALAZINE HCL 100 MG PO TABS: 100.0000 mg | ORAL_TABLET | Freq: Two times a day (BID) | ORAL | Status: AC

## 2021-05-08 NOTE — Discharge Summary (Signed)
Physician Discharge Summary  Ashley [name] HYW:[id number] DOB: 24-Jan-1946  PCP: Unk Pinto, MD  Admitted from: Home with hospice Discharged to: Home with hospice  Admit date: 05/06/2021 Discharge date: 05/08/2021  Recommendations for Outpatient Follow-up:    Follow-up Information     Orpah Melter, MD. Call.   Specialty: Internal Medicine Why: Follow-up regarding home hospice. Contact information: [place] [address] [phone]         Unk Pinto, MD. Schedule an appointment as soon as possible for a visit.   Specialty: Internal Medicine Why: As needed Contact information: [address] [place] [address] [phone]                  Home Health: None    Equipment/Devices: None    Discharge Condition: Improved but overall prognosis is guarded and poor.   Code Status: DNR Diet recommendation:  Discharge Diet Orders (From admission, onward)     Start     Ordered   05/08/21 0000  Diet - low sodium heart healthy        05/08/21 1457             Discharge Diagnoses:  Active Problems:   Acute metabolic encephalopathy   Malnutrition of moderate degree   Brief Summary: 75 year old female with medical history significant for type II DM, CKD stage V, hypertension, hyperlipidemia, chronic systolic CHF, on home hospice for CHF and CKD diagnosis, presented with complaints of poor oral intake over the last 2 weeks, worsened over the 3 days PTA to the point where she was not taking any p.o.'s at all, increasingly confused and aggressive over that time.  Mentation worsened and she even sustained a fall, spouse spoke with hospice care [place] patient presented to ED.  Large amount of stool disimpacted in ED by EDP.  Admitted for acute metabolic encephalopathy likely secondary to UTI.     Assessment & Plan:    Acute metabolic encephalopathy: - Possibly multifactorial but  mainly due to UTI. - On presentation had been aggressive with biting and hitting staff. - As per report, her mentation had been declining over the last couple of weeks and more so over the last couple of days PTA. - As per admitting MDs discussion with spouse, he was interested in residential hospice but wanted to try antibiotics, fluids overnight and if not improved then he would consider switching to comfort measures with the idea of getting her home or to residential hospice.  Admitting MD contacted [facility] - Mental status is significantly improved.  This was confirmed with patient's brother who was in to see her yesterday.  He feels that her mental status is back to her baseline.  She still has some intermittent mild confusion and paranoia.  Acute Enterobacter cystitis - Urine culture >100 K colonies of Enterobacter aerogenes - Completed antibiotics with 3 days course of IV ceftriaxone - As per patient's wishes related by her brother, patient does not wish to have any further antibiotics in the future.   Chronic dyspnea - Reports chronic dyspnea on mild exertion since diagnosis of stroke a couple of months ago. - Lowest oxygen saturation of 91% documented.  Remains on 2 L/min nasal cannula oxygen. - CT chest 01/26/2020: Radiation changes within the right lower and adjacent right middle lobes, without residual or recurrent right lower lobe lesion.  No evidence of thoracic adenopathy, similar 4 mm LLL pulmonary nodule. - Chest x-ray 9/2: Right midlung mass,  right lower lobe atelectasis. 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 - Dyspnea improved.  As per home hospice team, patient has a oxygen concentrator at home.  Continue oxygen for comfort.  Type II DM with hyperglycemia - Not on treatment PTA.  Hypokalemia - Replaced in the hospital but no follow-up as outpatient.  CKD stage V/anion  gap metabolic acidosis - Creatinine at baseline.  Patient reportedly has declined hemodialysis  Anemia of chronic disease/chronic kidney disease - Relatively stable.  Hyperlipidemia - Continue home meds  Essential hypertension - Labile and mildly uncontrolled at times.  Not on meds PTA except diuretics, resumed  Hypothyroidism - Continue home meds  Chronic systolic CHF - Continue prior home dose of Demadex to avoid volume overload and discomfort from same.  Fecal impaction/constipation - S/p disimpaction by EDP. - Aggressive bowel regimen.   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          Consultants:   None   Procedures:   None    Discharge Instructions  Discharge Instructions     (HEART FAILURE PATIENTS) Call MD:  Anytime you have any of the following symptoms: 1) 3 pound weight gain in 24 hours or 5 pounds in 1 week 2) shortness of breath, with or without a dry hacking cough 3) swelling in the hands, feet or stomach 4) if you have to sleep on extra pillows at night in order to breathe.   Complete by: As directed    Call MD for:  difficulty breathing, headache or visual disturbances   Complete by: As directed    Call MD for:  extreme fatigue   Complete by: As directed    Call MD for:  persistant dizziness or light-headedness   Complete by: As directed    Call MD for:  persistant nausea and vomiting   Complete by: As directed    Call MD for:  severe uncontrolled pain   Complete by: As directed    Call MD for:  temperature >100.4   Complete by: As directed    Diet - low sodium heart healthy   Complete by: As directed    Discharge instructions   Complete by: As directed    Oxygen via nasal cannula at 2 L/min, to be titrated for comfort.   Increase activity slowly   Complete by: As directed         Medication List     STOP taking these medications    ciprofloxacin 500 MG tablet Commonly known as: CIPRO       TAKE  these medications    acetaminophen 500 MG tablet Commonly known as: TYLENOL Take 1,000 mg by mouth every 6 (six) hours as needed for mild pain or headache.   amLODipine 10 MG tablet Commonly known as: NORVASC Take 1 tablet (10 mg total) by mouth daily.   atorvastatin 40 MG tablet Commonly known as: LIPITOR Take 1 tablet (40 mg total) by mouth daily.   clorazepate 7.5 MG tablet Commonly known as: Tranxene-T Take    1/2 to 1 tablet    1 or 2 x /day    ONLY if needed for  Nerves What changed:  how much to take how to take this when to take this reasons to take this additional instructions   [employer] Melatonin 5 MG Tabs Generic drug: melatonin Take 5 mg by mouth at bedtime  as needed (sleep).   famotidine 20 MG tablet Commonly known as: PEPCID TAKE 1 TABLET TWICE A DAY WITH MEALS FOR INDIGESTION. What changed: See the new instructions.   fenofibrate 145 MG tablet Commonly known as: TRICOR TAKE 1 TABLET DAILY FOR TRIGLYCERIDES (BLOOD FATS) What changed: See the new instructions.   ferrous sulfate 325 (65 FE) MG tablet TAKE 1 TABLET 2 TIMES A DAY WITH A MEAL FOR IRON DEFICIENCY What changed: See the new instructions.   FLUoxetine 20 MG capsule Commonly known as: PROZAC Take 20 mg by mouth daily.   hydrALAZINE 100 MG tablet Commonly known as: APRESOLINE Take 1 tablet (100 mg total) by mouth 2 (two) times daily.   hydrOXYzine 25 MG tablet Commonly known as: ATARAX/VISTARIL Take 25 mg by mouth every 6 (six) hours as needed for anxiety.   levothyroxine 88 MCG tablet Commonly known as: SYNTHROID TAKE 1 TABLET DAILY ON AN EMPTY STOMACH WITH ONLY WATER FOR 30 MINUTES & NO ANTACID MEDS, CALCIUM OR MAGNESIUM FOR 4 HOURS & AVOID BIOTIN What changed: See the new instructions.   loperamide 2 MG capsule Commonly known as: IMODIUM Take 2 mg by mouth daily as needed for diarrhea or loose stools.   nystatin cream Commonly known as: MYCOSTATIN Apply 1 application topically daily  as needed for dry skin.   OneTouch Ultra test strip Generic drug: glucose blood TEST BLOOD SUGAR 3 TIMES A DAY   polyethylene glycol 17 g packet Commonly known as: MIRALAX / GLYCOLAX Take 17 g by mouth daily.   torsemide 100 MG tablet Commonly known as: DEMADEX Take 1 tablet Daily - In case of worsening leg swelling or shortness of breath take twice daily. What changed:  how much to take how to take this when to take this additional instructions   vitamin C 500 MG tablet Commonly known as: ASCORBIC ACID Take 500 mg by mouth daily. Take with iron/ferrous sulfate   Vitamin D 125 MCG (5000 UT) Caps Take 5,000 Units by mouth in the morning and at bedtime.       Allergies  Allergen Reactions   Sulfa Antibiotics Other (See Comments)    Pt states she had "extreme pain"   Sulfacetamide Sodium Other (See Comments)    Pain      Procedures/Studies: None this admission  Subjective: Patient seen along with her female RN in the room.  Patient denies complaints.  No dyspnea reported.  Does not want any more antibiotics and wants to go home.  Discharge Exam:  Vitals:   05/08/21 0443 05/08/21 0828 05/08/21 1339 05/08/21 1354  BP: (!) 140/55  (!) 146/76   Pulse: 92  95   Resp: 16  18   Temp: 98.2 F (36.8 C)  97.7 F (36.5 C)   TempSrc: Oral  Oral   SpO2: 96% 96% 97% 95%  Weight:      Height:          General exam: Elderly female, small built, frail and chronically ill looking lying comfortably propped up in bed. Respiratory system: Reduced breath sounds bilaterally in the bases.  No wheezing, rhonchi or crackles.  No increased work of breathing. Cardiovascular system: S1 & S2 heard, RRR. No JVD, murmurs, rubs, gallops or clicks. No pedal edema.  Telemetry personally reviewed: Sinus rhythm. Gastrointestinal system: Abdomen is nondistended, soft and nontender. No organomegaly or masses felt. Normal bowel sounds heard. Central nervous system: Alert and oriented. No focal  neurological deficits. Extremities: Right limbs grade 5 x 5  power.  Left lower extremity at least grade 3 x 5 power.  Left upper extremity grade 2 x 5 power proximally and 1 x 5 power distally. Skin: Right groin fungal appearing rash. Psychiatry: Judgement and insight appear impaired. Mood & affect appropriate.     The results of significant diagnostics from this hospitalization (including imaging, microbiology, ancillary and laboratory) are listed below for reference.     Microbiology: Recent Results (from the past 240 hour(s))  Blood culture (routine x 2)     Status: None (Preliminary result)   Collection Time: 05/06/21 11:25 AM   Specimen: BLOOD  Result Value Ref Range Status   Specimen Description   Final    BLOOD BLOOD RIGHT FOREARM Performed at [facility] [address], [place], [place] [address]    Special Requests   Final    BOTTLES DRAWN AEROBIC AND ANAEROBIC Blood Culture adequate volume Performed at [facility] [address], [place], [place] [address]    Culture   Final    NO GROWTH 2 DAYS Performed at [facility] [address], [place], [place] [address]    Report Status PENDING  Incomplete  Blood culture (routine x 2)     Status: None (Preliminary result)   Collection Time: 05/06/21 11:33 AM   Specimen: BLOOD  Result Value Ref Range Status   Specimen Description   Final    BLOOD BLOOD RIGHT FOREARM UPPER Performed at [facility] [address], [place], [place] [address]    Special Requests   Final    BOTTLES DRAWN AEROBIC AND ANAEROBIC Blood Culture adequate volume Performed at [facility] [address], [place], [place] [address]    Culture   Final    NO GROWTH 2 DAYS Performed at [facility] [address] [place],  [address]    Report Status PENDING  Incomplete  Resp Panel by RT-PCR (Flu A&B, Covid) Nasopharyngeal Swab     Status: None    Collection Time: 05/06/21 12:00 PM   Specimen: Nasopharyngeal Swab; Nasopharyngeal(NP) swabs in vial transport medium  Result Value Ref Range Status   SARS Coronavirus 2 by RT PCR NEGATIVE NEGATIVE Final    Comment: (NOTE) SARS-CoV-2 target nucleic acids are NOT DETECTED.  The SARS-CoV-2 RNA is generally detectable in upper respiratory specimens during the acute phase of infection. The lowest concentration of SARS-CoV-2 viral copies this assay can detect is 138 copies/mL. A negative result does not preclude SARS-Cov-2 infection and should not be used as the sole basis for treatment or other patient management decisions. A negative result may occur with  improper specimen collection/handling, submission of specimen other than nasopharyngeal swab, presence of viral mutation(s) within the areas targeted by this assay, and inadequate number of viral copies(<138 copies/mL). A negative result must be combined with clinical observations, patient history, and epidemiological information. The expected result is Negative.  Fact Sheet for Patients:  EntrepreneurPulse.com.au  Fact Sheet for Healthcare Providers:  IncredibleEmployment.be  This test is no t yet approved or cleared by the [place] FDA and  has been authorized for detection and/or diagnosis of SARS-CoV-2 by FDA under an Emergency Use Authorization (EUA). This EUA will remain  in effect (meaning this test can be used) for the duration of the COVID-19 declaration under Section 564(b)(1) of the Act, 21 U.S.C.section 360bbb-3(b)(1), unless the authorization is terminated  or revoked sooner.       Influenza A  by PCR NEGATIVE NEGATIVE Final   Influenza B by PCR NEGATIVE NEGATIVE Final    Comment: (NOTE) The Xpert Xpress SARS-CoV-2/FLU/RSV plus assay is intended as an aid in the diagnosis of influenza from Nasopharyngeal swab specimens and should not be used as a sole basis for treatment.  Nasal washings and aspirates are unacceptable for Xpert Xpress SARS-CoV-2/FLU/RSV testing.  Fact Sheet for Patients: EntrepreneurPulse.com.au  Fact Sheet for Healthcare Providers: IncredibleEmployment.be  This test is not yet approved or cleared by the [place] FDA and has been authorized for detection and/or diagnosis of SARS-CoV-2 by FDA under an Emergency Use Authorization (EUA). This EUA will remain in effect (meaning this test can be used) for the duration of the COVID-19 declaration under Section 564(b)(1) of the Act, 21 U.S.C. section 360bbb-3(b)(1), unless the authorization is terminated or revoked.  Performed at [facility], [place] [address] [place], [place] [address]   Urine Culture     Status: Abnormal   Collection Time: 05/06/21  1:34 PM   Specimen: Urine, Catheterized  Result Value Ref Range Status   Specimen Description   Final    URINE, CATHETERIZED Performed at [facility] [address] [place], [place] [address]    Special Requests   Final    NONE Performed at [facility], [place] [address], [place], [place] [address]    Culture >=100,000 COLONIES/mL ENTEROBACTER AEROGENES (A)  Final   Report Status 05/08/2021 FINAL  Final   Organism ID, Bacteria ENTEROBACTER AEROGENES (A)  Final      Susceptibility   Enterobacter aerogenes - MIC*    CEFAZOLIN >=64 RESISTANT Resistant     CEFEPIME <=0.12 SENSITIVE Sensitive     CEFTRIAXONE <=0.25 SENSITIVE Sensitive     CIPROFLOXACIN 0.5 SENSITIVE Sensitive     GENTAMICIN <=1 SENSITIVE Sensitive     IMIPENEM 2 SENSITIVE Sensitive     NITROFURANTOIN 128 RESISTANT Resistant     TRIMETH/SULFA <=20 SENSITIVE Sensitive     PIP/TAZO <=4 SENSITIVE Sensitive     * >=100,000 COLONIES/mL ENTEROBACTER AEROGENES     Labs: CBC: Recent Labs  Lab 05/06/21 1125 05/07/21 0359 05/08/21 0341  WBC 7.5 8.5 7.9  HGB 9.4* 8.4* 8.6*   HCT 28.6* 25.3* 26.1*  MCV 90.2 88.8 89.7  PLT 344 295 914    Basic Metabolic Panel: Recent Labs  Lab 05/06/21 1125 05/06/21 1817 05/07/21 0359 05/08/21 0341  NA 138 135 131* 133*  K 3.1* 3.7 3.3* 3.4*  CL 98 96* 96* 99  CO2 21* 21* 17* 20*  GLUCOSE 438* 337* 339* 275*  BUN 99* 97* 92* 96*  CREATININE 4.96* 4.49* 4.39* 4.42*  CALCIUM 9.2 8.6* 8.6* 8.8*  MG 2.6*  --   --   --     Liver Function Tests: Recent Labs  Lab 05/06/21 1125 05/06/21 1817 05/07/21 0359  AST 19 20 17   ALT 18 19 16   ALKPHOS 36* 35* 34*  BILITOT 0.9 0.7 0.5  PROT 6.7 6.6 6.3*  ALBUMIN 3.2* 3.1* 3.1*    CBG: Recent Labs  Lab 05/07/21 1120 05/07/21 1636 05/07/21 2012 05/08/21 0751 05/08/21 1236  GLUCAP 271* 322* 301* 245* 323*    Hgb A1c Recent Labs    05/06/21 1125  HGBA1C 7.9*     Urinalysis    Component Value Date/Time   COLORURINE YELLOW 05/06/2021 1334   APPEARANCEUR TURBID (A) 05/06/2021 1334   LABSPEC 1.025 05/06/2021 1334   PHURINE 6.0 05/06/2021 1334  GLUCOSEU 500 (A) 05/06/2021 1334   HGBUR SMALL (A) 05/06/2021 1334   BILIRUBINUR NEGATIVE 05/06/2021 1334   KETONESUR NEGATIVE 05/06/2021 1334   PROTEINUR >=300 (A) 05/06/2021 1334   UROBILINOGEN 0.2 10/26/2014 1610   NITRITE NEGATIVE 05/06/2021 1334   LEUKOCYTESUR MODERATE (A) 05/06/2021 1334    I discussed in detail with patient's brother/healthcare power of attorney Kaleen Mask, updated care and answered all questions.  With his assistance, also completed the MOST form-DNR, comfort measures, no antibiotics, no IV fluids and no transfer to the hospital.  Since patient intermittently confused and unable to comprehend the complexity and significance of this decision making, discussed with him and he made decisions on her behalf.  Time coordinating discharge: 25 minutes  SIGNED:  Vernell Leep, MD, [place], [facility]. [facility]  To contact the attending provider between 7A-7P or the covering provider during  after hours 7P-7A, please log into the web site [email or web address] and access using universal [place] password for that web site. If you do not have the password, please call the hospital operator.

## 2021-05-08 NOTE — TOC Progression Note (Signed)
Transition of Care [facility]) - Progression Note    Patient Details  Name: Ashley [name] MRN: [id number] Date of Birth: 1945/12/17  [facility]) CM/SW Contact  Purcell Mouton, RN Phone Number: 05/08/2021, 10:33 AM  Clinical Narrative:    Spoke with [facility] Chrisyln, RN pt has O2 at home and sitters. Pt will need [facility] for transportation home.    Expected Discharge Plan: Home w Hospice Care Barriers to Discharge: No Barriers Identified  Expected Discharge Plan and Services Expected Discharge Plan: [place] arrangements for the past 2 months: Single Family Home                                       Social Determinants of Health (SDOH) Interventions    Readmission Risk Interventions Readmission Risk Prevention Plan 03/03/2021  Transportation Screening Complete  Medication Review Press photographer) Complete  PCP or Specialist appointment within 3-5 days of discharge Complete  HRI or [place] Complete  SW Recovery Care/Counseling Consult Complete  [place] Not Applicable  Some recent data might be hidden

## 2021-05-08 NOTE — Progress Notes (Signed)
[place] ([facility])    Ashley [name] is a current hospice patient with [facility], admitted with a terminal diagnosis of Acute on chronic systolic (congestive) heart failure. Pt has 5L O2 concentrator already in the home.  Confirmed with pt's brother and HCPOA plans to discharge home today; confirmed availability of paid caregiver.  Exchanged report with TOC Cookie.  Updated [place] home care team to prepare for discharge.  At time of discharge please use [facility] for transport as we contract this service for our active hospice pts.  Thank you for the opportunity to participate in this pt's care.  Domenic Moras, BSN, RN [facility] Liaison [phone]

## 2021-05-08 NOTE — TOC Progression Note (Signed)
Transition of Care [facility]) - Progression Note    Patient Details  Name: Ashley [name] MRN: [id number] Date of Birth: March 25, 1946  Transition [facility]) CM/SW Contact  Purcell Mouton, RN Phone Number: 05/08/2021, 10:24 AM  Clinical Narrative:    Pt is active with [facility]. A call was made to [facility] to inform them that pt will need home O2. Waiting for return call.    Expected Discharge Plan: Home w Hospice Care Barriers to Discharge: No Barriers Identified  Expected Discharge Plan and Services Expected Discharge Plan: [place] arrangements for the past 2 months: Single Family Home                                       Social Determinants of Health (SDOH) Interventions    Readmission Risk Interventions Readmission Risk Prevention Plan 03/03/2021  Transportation Screening Complete  Medication Review Press photographer) Complete  PCP or Specialist appointment within 3-5 days of discharge Complete  HRI or [place] Complete  SW Recovery Care/Counseling Consult Complete  [place] Not Applicable  Some recent data might be hidden

## 2021-05-08 NOTE — Plan of Care (Signed)
  Problem: Elimination: Goal: Will not experience complications related to bowel motility Outcome: Progressing Goal: Will not experience complications related to urinary retention Outcome: Progressing   Problem: Pain Managment: Goal: General experience of comfort will improve Outcome: Progressing   Problem: Safety: Goal: Ability to remain free from injury will improve Outcome: Progressing   

## 2021-05-08 NOTE — Discharge Instructions (Signed)

## 2021-05-08 NOTE — TOC Progression Note (Signed)
Transition of Care [facility]) - Progression Note    Patient Details  Name: QIANNA CLAGETT MRN: [id number] Date of Birth: 03-30-1946  Transition of Care [facility]) CM/SW Contact  Purcell Mouton, RN Phone Number: 05/08/2021, 4:05 PM  Clinical Narrative:    Charisse Klinefelter [phone] called for transport to home.    Expected Discharge Plan: Home w Hospice Care Barriers to Discharge: No Barriers Identified  Expected Discharge Plan and Services Expected Discharge Plan: [place] arrangements for the past 2 months: Single Family Home Expected Discharge Date: 05/08/21                                     Social Determinants of Health (SDOH) Interventions    Readmission Risk Interventions Readmission Risk Prevention Plan 03/03/2021  Transportation Screening Complete  Medication Review Press photographer) Complete  PCP or Specialist appointment within 3-5 days of discharge Complete  HRI or [place] Complete  SW Recovery Care/Counseling Consult Complete  [place] Not Applicable  Some recent data might be hidden

## 2021-05-11 LAB — CULTURE, BLOOD (ROUTINE X 2)
Culture: NO GROWTH
Culture: NO GROWTH
Special Requests: ADEQUATE
Special Requests: ADEQUATE

## 2021-05-28 ENCOUNTER — Other Ambulatory Visit: Payer: Self-pay | Admitting: Nurse Practitioner

## 2021-05-28 DIAGNOSIS — E039 Hypothyroidism, unspecified: Secondary | ICD-10-CM

## 2021-05-28 DIAGNOSIS — D649 Anemia, unspecified: Secondary | ICD-10-CM

## 2021-05-30 ENCOUNTER — Other Ambulatory Visit: Payer: Self-pay | Admitting: Internal Medicine

## 2021-05-30 DIAGNOSIS — N185 Chronic kidney disease, stage 5: Secondary | ICD-10-CM

## 2021-06-18 DEATH — deceased

## 2021-07-02 ENCOUNTER — Ambulatory Visit: Payer: HMO | Admitting: Adult Health

## 2021-07-19 ENCOUNTER — Other Ambulatory Visit: Payer: Self-pay | Admitting: Internal Medicine

## 2022-09-07 IMAGING — CT CT ABD-PELV W/O CM
2 of 4 series · 16 of 46 positions shown, 18 images · non-contrast
Comparison: CT abdomen pelvis, 05/15/2019, CT chest, 01/26/2020

CLINICAL DATA: Abdominal pain and distension, nausea and vomiting,
chronic kidney disease, and right lower lobe non-small cell lung
cancer

EXAM:
CT ABDOMEN AND PELVIS WITHOUT CONTRAST
TECHNIQUE: Multidetector CT imaging of the abdomen and pelvis was performed
following the standard protocol without IV contrast.

[Series 2: axial st · axial · 0.84mm/px · z∈[+947,+1357]mm · 13 of 92 slices shown, 15 images]
[im 5/92  soft-tissue]
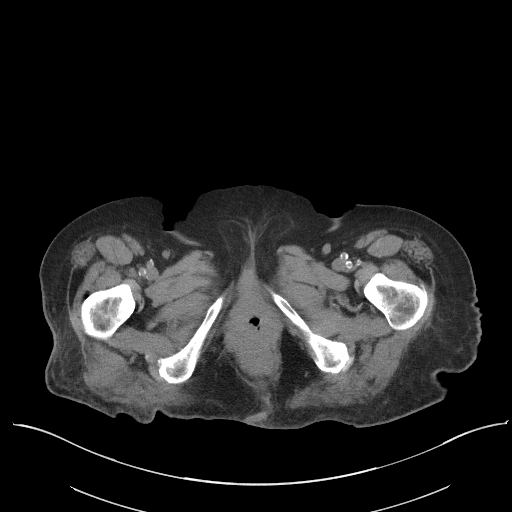
[im 5/92  bone]
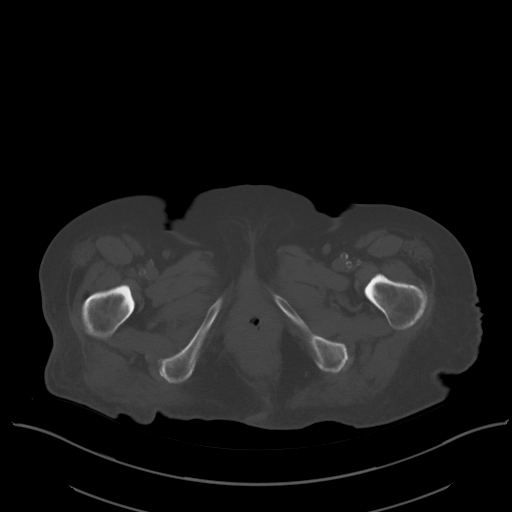
[im 14/92  soft-tissue]
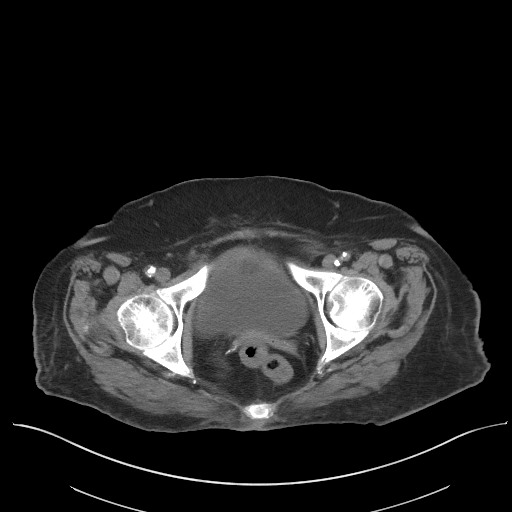
[im 19/92  soft-tissue]
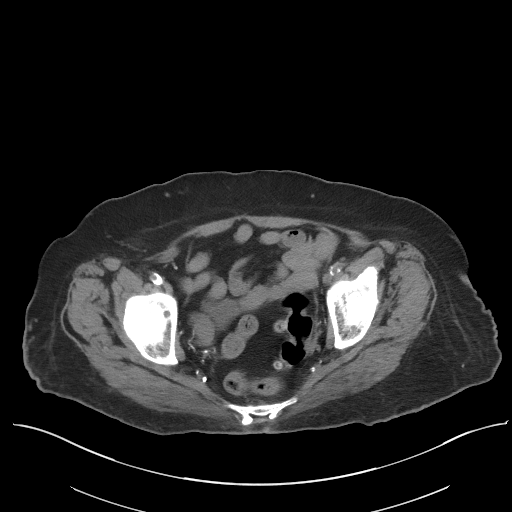
[im 28/92  soft-tissue]
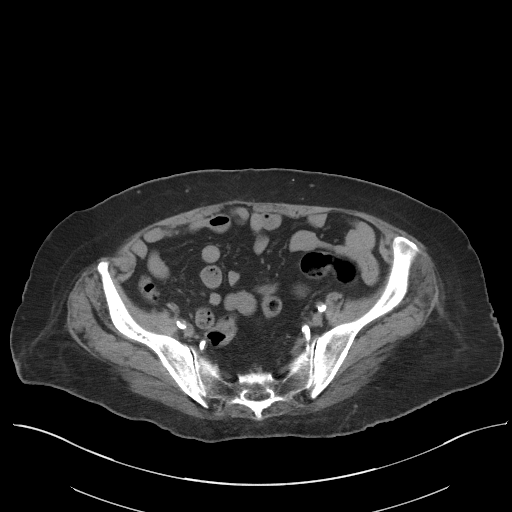
[im 32/92  soft-tissue]
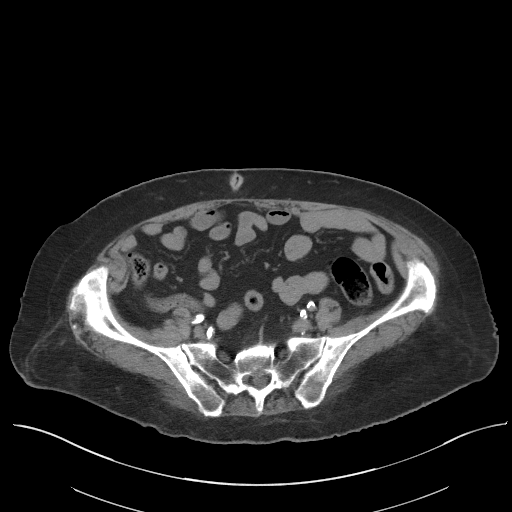
[im 41/92  soft-tissue]
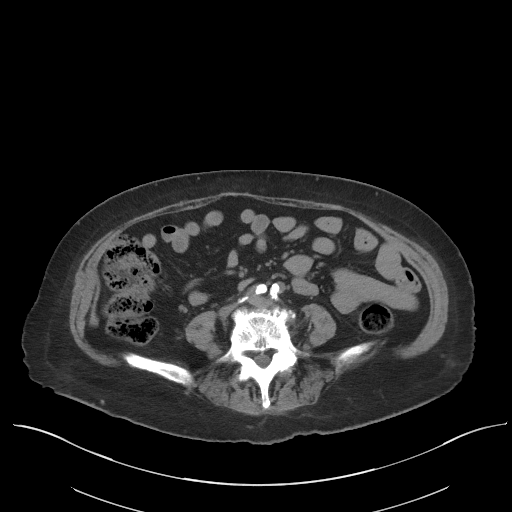
[im 46/92  soft-tissue]
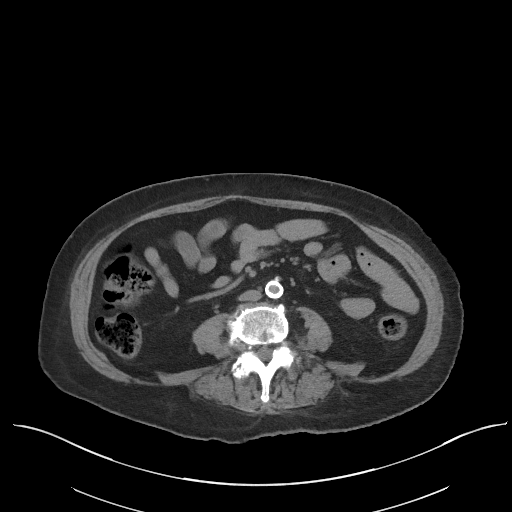
[im 51/92  soft-tissue]
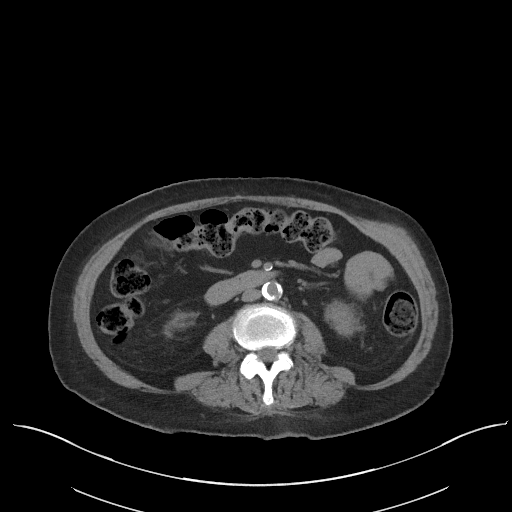
[im 60/92  soft-tissue]
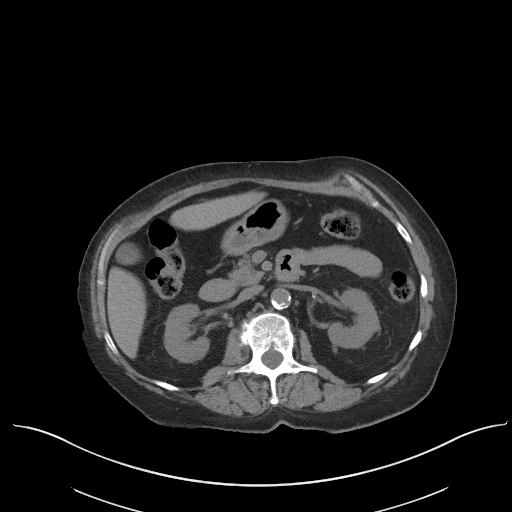
[im 60/92  bone]
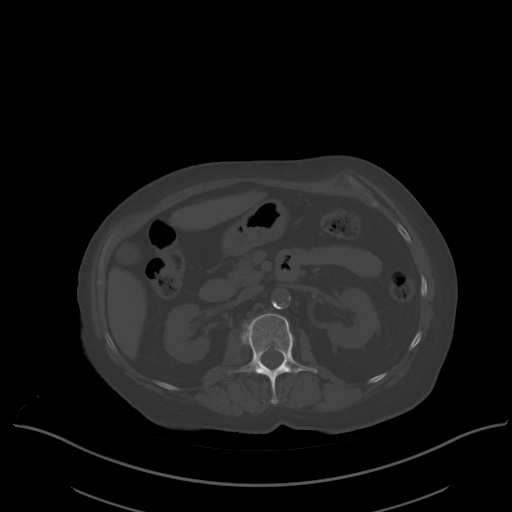
[im 64/92  soft-tissue]
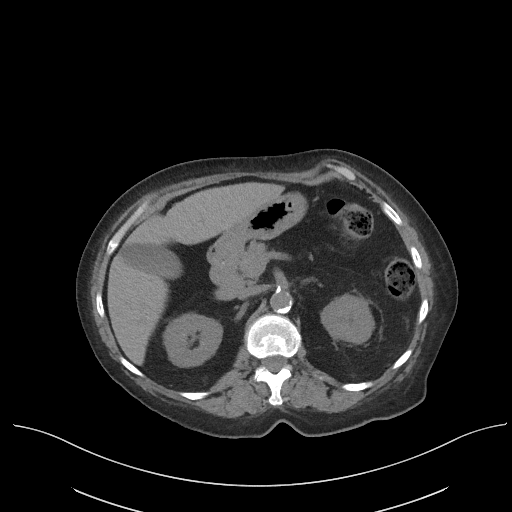
[im 73/92  soft-tissue]
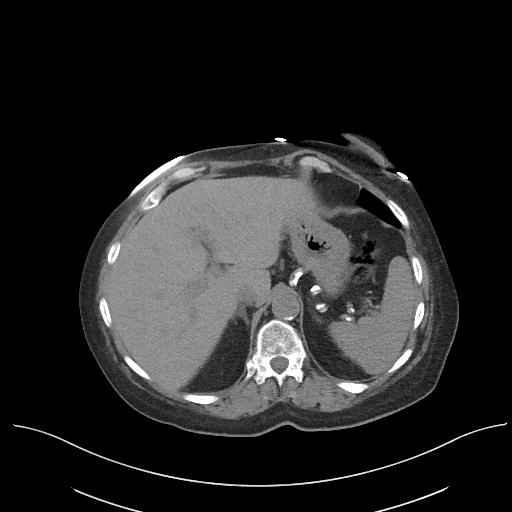
[im 78/92  soft-tissue]
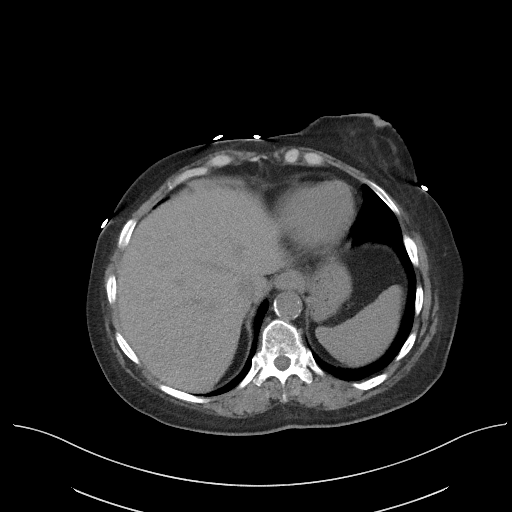
[im 87/92  soft-tissue]
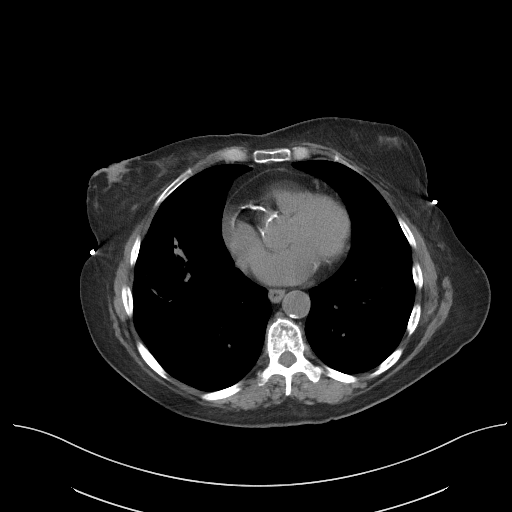

[Series 4: coronal st · coronal · 0.77mm/px · 3 of 93 slices shown]
[im 31/93  soft-tissue]
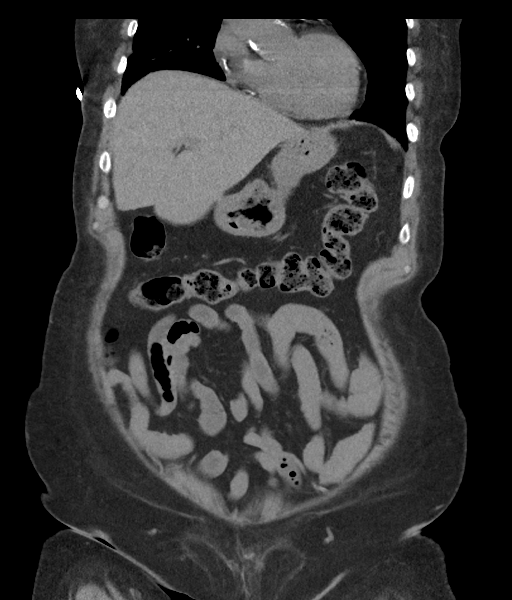
[im 41/93  soft-tissue]
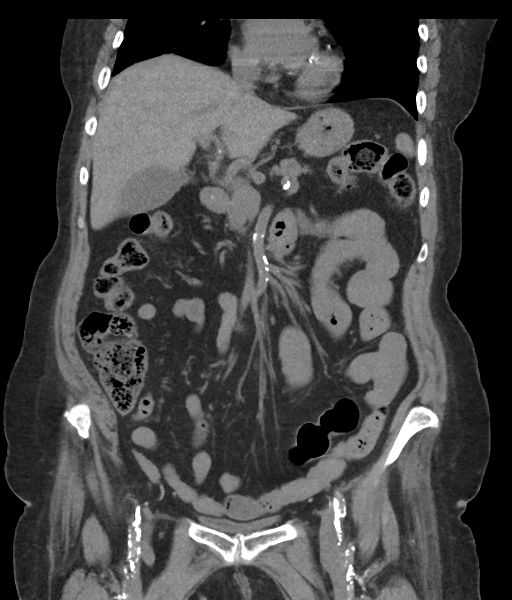
[im 52/93  soft-tissue]
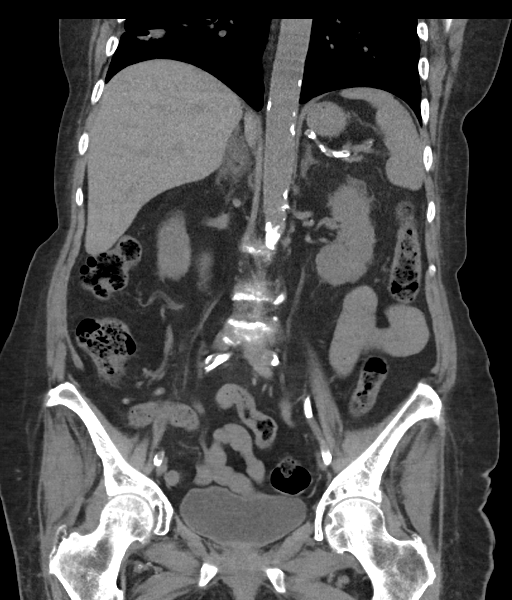

[16 of 46 positions shown; findings below may reference images not displayed]

FINDINGS: Lower chest: No acute abnormality. Redemonstrated irregular and
consolidative opacity in the partially imaged right lung base with
fiducial markers. Coronary artery calcifications.

Hepatobiliary: No solid liver abnormality is seen. No gallstones,
gallbladder wall thickening, or biliary dilatation.

Pancreas: Unremarkable. No pancreatic ductal dilatation or
surrounding inflammatory changes.

Spleen: Normal in size without significant abnormality.

Adrenals/Urinary Tract: Adrenal glands are unremarkable. Punctuate
calculi and or renal vascular calcifications. Bladder is
unremarkable.

Stomach/Bowel: Stomach is within normal limits. Appendix appears
normal. No evidence of bowel wall thickening, distention, or
inflammatory changes. Sigmoid diverticulosis.

Vascular/Lymphatic: Aortic atherosclerosis. No enlarged abdominal or
pelvic lymph nodes.

Reproductive: Status post hysterectomy.

Other: No abdominal wall hernia or abnormality. No abdominopelvic
ascites.

Musculoskeletal: No acute or significant osseous findings.
IMPRESSION: 1. No acute noncontrast CT findings of the abdomen or pelvis to
explain abdominal pain, nausea, or vomiting.
2. Punctuate bilateral renal calculi and or renal vascular
calcifications. No hydronephrosis.
3. Sigmoid diverticulosis without evidence of acute diverticulitis.
4. Redemonstrated irregular and consolidative opacity in the
partially imaged right lung base with fiducial markers, in keeping
with known lung malignancy; refer to or obtain dedicated imaging of
the chest for assessment of ongoing treatment response.
5. Coronary artery disease.

Aortic Atherosclerosis ([id number]).
